# Patient Record
Sex: Female | Born: 1935
Health system: Southern US, Community
[De-identification: ages and names within clinical notes are randomized; demographics above are authoritative.]

## PROBLEM LIST (undated history)

## (undated) DIAGNOSIS — R0602 Shortness of breath: Secondary | ICD-10-CM

## (undated) DIAGNOSIS — J189 Pneumonia, unspecified organism: Secondary | ICD-10-CM

## (undated) DIAGNOSIS — Z9289 Personal history of other medical treatment: Secondary | ICD-10-CM

## (undated) DIAGNOSIS — K8020 Calculus of gallbladder without cholecystitis without obstruction: Secondary | ICD-10-CM

## (undated) DIAGNOSIS — G629 Polyneuropathy, unspecified: Secondary | ICD-10-CM

## (undated) DIAGNOSIS — K219 Gastro-esophageal reflux disease without esophagitis: Secondary | ICD-10-CM

## (undated) DIAGNOSIS — J449 Chronic obstructive pulmonary disease, unspecified: Secondary | ICD-10-CM

## (undated) DIAGNOSIS — I351 Nonrheumatic aortic (valve) insufficiency: Secondary | ICD-10-CM

## (undated) DIAGNOSIS — R112 Nausea with vomiting, unspecified: Secondary | ICD-10-CM

## (undated) DIAGNOSIS — Z9889 Other specified postprocedural states: Secondary | ICD-10-CM

## (undated) DIAGNOSIS — I499 Cardiac arrhythmia, unspecified: Secondary | ICD-10-CM

## (undated) DIAGNOSIS — H6120 Impacted cerumen, unspecified ear: Secondary | ICD-10-CM

## (undated) DIAGNOSIS — I4891 Unspecified atrial fibrillation: Secondary | ICD-10-CM

## (undated) DIAGNOSIS — I10 Essential (primary) hypertension: Secondary | ICD-10-CM

## (undated) DIAGNOSIS — M1990 Unspecified osteoarthritis, unspecified site: Secondary | ICD-10-CM

## (undated) DIAGNOSIS — E739 Lactose intolerance, unspecified: Secondary | ICD-10-CM

## (undated) DIAGNOSIS — I872 Venous insufficiency (chronic) (peripheral): Secondary | ICD-10-CM

## (undated) DIAGNOSIS — F419 Anxiety disorder, unspecified: Secondary | ICD-10-CM

## (undated) DIAGNOSIS — E039 Hypothyroidism, unspecified: Secondary | ICD-10-CM

## (undated) DIAGNOSIS — Z8719 Personal history of other diseases of the digestive system: Secondary | ICD-10-CM

## (undated) HISTORY — PX: CATARACT EXTRACTION W/ INTRAOCULAR LENS  IMPLANT, BILATERAL: SHX1307

## (undated) HISTORY — DX: Lactose intolerance, unspecified: E73.9

## (undated) HISTORY — DX: Unspecified atrial fibrillation: I48.91

## (undated) HISTORY — DX: Personal history of other medical treatment: Z92.89

## (undated) HISTORY — PX: JOINT REPLACEMENT: SHX530

## (undated) HISTORY — DX: Polyneuropathy, unspecified: G62.9

## (undated) HISTORY — DX: Chronic obstructive pulmonary disease, unspecified: J44.9

## (undated) HISTORY — DX: Essential (primary) hypertension: I10

## (undated) HISTORY — DX: Impacted cerumen, unspecified ear: H61.20

## (undated) HISTORY — PX: HERNIA REPAIR: SHX51

## (undated) HISTORY — PX: BACK SURGERY: SHX140

## (undated) HISTORY — DX: Calculus of gallbladder without cholecystitis without obstruction: K80.20

## (undated) HISTORY — DX: Venous insufficiency (chronic) (peripheral): I87.2

## (undated) HISTORY — DX: Hypothyroidism, unspecified: E03.9

## (undated) HISTORY — DX: Nonrheumatic aortic (valve) insufficiency: I35.1

## (undated) HISTORY — PX: CARPAL TUNNEL RELEASE: SHX101

## (undated) HISTORY — DX: Anxiety disorder, unspecified: F41.9

---
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
## 1968-03-31 HISTORY — PX: TOTAL ABDOMINAL HYSTERECTOMY: SHX209

## 1997-07-21 ENCOUNTER — Ambulatory Visit (HOSPITAL_COMMUNITY): Admission: RE | Admit: 1997-07-21 | Discharge: 1997-07-21 | Payer: Self-pay | Admitting: *Deleted

## 1997-12-21 ENCOUNTER — Encounter: Payer: Self-pay | Admitting: Gastroenterology

## 1997-12-21 ENCOUNTER — Ambulatory Visit (HOSPITAL_COMMUNITY): Admission: RE | Admit: 1997-12-21 | Discharge: 1997-12-21 | Payer: Self-pay | Admitting: Gastroenterology

## 1998-02-05 ENCOUNTER — Encounter: Payer: Self-pay | Admitting: Specialist

## 1998-02-05 ENCOUNTER — Ambulatory Visit (HOSPITAL_COMMUNITY): Admission: RE | Admit: 1998-02-05 | Discharge: 1998-02-05 | Payer: Self-pay | Admitting: Specialist

## 1998-03-31 HISTORY — PX: OTHER SURGICAL HISTORY: SHX169

## 1998-05-15 ENCOUNTER — Other Ambulatory Visit: Admission: RE | Admit: 1998-05-15 | Discharge: 1998-05-15 | Payer: Self-pay | Admitting: Internal Medicine

## 1998-06-18 ENCOUNTER — Ambulatory Visit (HOSPITAL_COMMUNITY): Admission: RE | Admit: 1998-06-18 | Discharge: 1998-06-18 | Payer: Self-pay | Admitting: *Deleted

## 1998-10-17 ENCOUNTER — Inpatient Hospital Stay (HOSPITAL_COMMUNITY): Admission: RE | Admit: 1998-10-17 | Discharge: 1998-10-23 | Payer: Self-pay | Admitting: Orthopedic Surgery

## 1998-10-17 ENCOUNTER — Encounter: Payer: Self-pay | Admitting: Orthopedic Surgery

## 1998-10-20 ENCOUNTER — Encounter: Payer: Self-pay | Admitting: Orthopedic Surgery

## 1998-10-23 ENCOUNTER — Inpatient Hospital Stay (HOSPITAL_COMMUNITY)
Admission: RE | Admit: 1998-10-23 | Discharge: 1998-10-27 | Payer: Self-pay | Admitting: Physical Medicine & Rehabilitation

## 1998-10-31 ENCOUNTER — Encounter: Admission: RE | Admit: 1998-10-31 | Discharge: 1998-11-18 | Payer: Self-pay

## 2000-04-04 ENCOUNTER — Emergency Department (HOSPITAL_COMMUNITY): Admission: EM | Admit: 2000-04-04 | Discharge: 2000-04-04 | Payer: Self-pay | Admitting: Emergency Medicine

## 2000-04-04 ENCOUNTER — Encounter: Payer: Self-pay | Admitting: Emergency Medicine

## 2000-04-09 ENCOUNTER — Emergency Department (HOSPITAL_COMMUNITY): Admission: EM | Admit: 2000-04-09 | Discharge: 2000-04-09 | Payer: Self-pay | Admitting: Emergency Medicine

## 2000-07-29 ENCOUNTER — Encounter: Payer: Self-pay | Admitting: Neurological Surgery

## 2000-07-29 ENCOUNTER — Encounter: Admission: RE | Admit: 2000-07-29 | Discharge: 2000-07-29 | Payer: Self-pay | Admitting: Neurological Surgery

## 2000-08-11 ENCOUNTER — Encounter: Admission: RE | Admit: 2000-08-11 | Discharge: 2000-08-11 | Payer: Self-pay | Admitting: Gastroenterology

## 2000-08-11 ENCOUNTER — Encounter: Payer: Self-pay | Admitting: Gastroenterology

## 2000-08-25 ENCOUNTER — Encounter: Admission: RE | Admit: 2000-08-25 | Discharge: 2000-08-25 | Payer: Self-pay | Admitting: Neurological Surgery

## 2000-08-25 ENCOUNTER — Encounter: Payer: Self-pay | Admitting: Neurological Surgery

## 2000-09-16 ENCOUNTER — Encounter: Admission: RE | Admit: 2000-09-16 | Discharge: 2000-09-16 | Payer: Self-pay | Admitting: Gastroenterology

## 2000-09-16 ENCOUNTER — Encounter: Payer: Self-pay | Admitting: Gastroenterology

## 2000-11-02 ENCOUNTER — Ambulatory Visit (HOSPITAL_COMMUNITY): Admission: RE | Admit: 2000-11-02 | Discharge: 2000-11-02 | Payer: Self-pay | Admitting: Gastroenterology

## 2000-11-02 ENCOUNTER — Encounter (INDEPENDENT_AMBULATORY_CARE_PROVIDER_SITE_OTHER): Payer: Self-pay | Admitting: *Deleted

## 2000-12-16 ENCOUNTER — Ambulatory Visit (HOSPITAL_COMMUNITY): Admission: RE | Admit: 2000-12-16 | Discharge: 2000-12-16 | Payer: Self-pay | Admitting: Gastroenterology

## 2000-12-16 ENCOUNTER — Encounter: Payer: Self-pay | Admitting: Gastroenterology

## 2002-07-21 ENCOUNTER — Emergency Department (HOSPITAL_COMMUNITY): Admission: EM | Admit: 2002-07-21 | Discharge: 2002-07-21 | Payer: Self-pay | Admitting: *Deleted

## 2003-05-05 ENCOUNTER — Encounter: Admission: RE | Admit: 2003-05-05 | Discharge: 2003-05-05 | Payer: Self-pay | Admitting: Internal Medicine

## 2003-07-24 ENCOUNTER — Encounter: Admission: RE | Admit: 2003-07-24 | Discharge: 2003-07-24 | Payer: Self-pay | Admitting: Neurological Surgery

## 2004-04-08 ENCOUNTER — Encounter: Admission: RE | Admit: 2004-04-08 | Discharge: 2004-04-08 | Payer: Self-pay | Admitting: Gastroenterology

## 2005-03-13 ENCOUNTER — Inpatient Hospital Stay (HOSPITAL_COMMUNITY): Admission: EM | Admit: 2005-03-13 | Discharge: 2005-03-16 | Payer: Self-pay | Admitting: Emergency Medicine

## 2005-04-24 ENCOUNTER — Observation Stay (HOSPITAL_COMMUNITY): Admission: AD | Admit: 2005-04-24 | Discharge: 2005-04-28 | Payer: Self-pay | Admitting: Internal Medicine

## 2005-11-10 ENCOUNTER — Encounter: Admission: RE | Admit: 2005-11-10 | Discharge: 2005-11-10 | Payer: Self-pay | Admitting: Gastroenterology

## 2006-03-31 HISTORY — PX: COLON SURGERY: SHX602

## 2006-06-19 ENCOUNTER — Encounter (INDEPENDENT_AMBULATORY_CARE_PROVIDER_SITE_OTHER): Payer: Self-pay | Admitting: *Deleted

## 2006-06-19 ENCOUNTER — Inpatient Hospital Stay (HOSPITAL_COMMUNITY): Admission: EM | Admit: 2006-06-19 | Discharge: 2006-06-30 | Payer: Self-pay | Admitting: Emergency Medicine

## 2006-11-02 ENCOUNTER — Encounter: Admission: RE | Admit: 2006-11-02 | Discharge: 2006-11-02 | Payer: Self-pay | Admitting: General Surgery

## 2006-11-10 ENCOUNTER — Encounter (INDEPENDENT_AMBULATORY_CARE_PROVIDER_SITE_OTHER): Payer: Self-pay | Admitting: General Surgery

## 2006-11-10 ENCOUNTER — Inpatient Hospital Stay (HOSPITAL_COMMUNITY): Admission: RE | Admit: 2006-11-10 | Discharge: 2006-11-17 | Payer: Self-pay | Admitting: General Surgery

## 2006-12-29 ENCOUNTER — Emergency Department (HOSPITAL_COMMUNITY): Admission: EM | Admit: 2006-12-29 | Discharge: 2006-12-29 | Payer: Self-pay | Admitting: Emergency Medicine

## 2007-06-30 ENCOUNTER — Encounter: Admission: RE | Admit: 2007-06-30 | Discharge: 2007-06-30 | Payer: Self-pay | Admitting: Internal Medicine

## 2007-07-02 ENCOUNTER — Ambulatory Visit (HOSPITAL_COMMUNITY): Admission: RE | Admit: 2007-07-02 | Discharge: 2007-07-02 | Payer: Self-pay | Admitting: Internal Medicine

## 2007-08-06 ENCOUNTER — Emergency Department (HOSPITAL_COMMUNITY): Admission: EM | Admit: 2007-08-06 | Discharge: 2007-08-06 | Payer: Self-pay | Admitting: Emergency Medicine

## 2008-06-21 ENCOUNTER — Encounter: Admission: RE | Admit: 2008-06-21 | Discharge: 2008-06-21 | Payer: Self-pay | Admitting: Gastroenterology

## 2008-07-12 ENCOUNTER — Ambulatory Visit (HOSPITAL_COMMUNITY): Admission: RE | Admit: 2008-07-12 | Discharge: 2008-07-12 | Payer: Self-pay | Admitting: Internal Medicine

## 2008-10-02 ENCOUNTER — Emergency Department (HOSPITAL_COMMUNITY): Admission: EM | Admit: 2008-10-02 | Discharge: 2008-10-03 | Payer: Self-pay | Admitting: Emergency Medicine

## 2009-04-25 DIAGNOSIS — M810 Age-related osteoporosis without current pathological fracture: Secondary | ICD-10-CM | POA: Insufficient documentation

## 2009-05-16 DIAGNOSIS — E7439 Other disorders of intestinal carbohydrate absorption: Secondary | ICD-10-CM | POA: Insufficient documentation

## 2009-05-16 DIAGNOSIS — G629 Polyneuropathy, unspecified: Secondary | ICD-10-CM | POA: Insufficient documentation

## 2009-05-16 DIAGNOSIS — R7301 Impaired fasting glucose: Secondary | ICD-10-CM | POA: Insufficient documentation

## 2009-05-23 ENCOUNTER — Encounter: Admission: RE | Admit: 2009-05-23 | Discharge: 2009-05-23 | Payer: Self-pay | Admitting: Internal Medicine

## 2009-06-11 ENCOUNTER — Encounter: Admission: RE | Admit: 2009-06-11 | Discharge: 2009-06-11 | Payer: Self-pay | Admitting: Gastroenterology

## 2010-06-04 ENCOUNTER — Other Ambulatory Visit: Payer: Self-pay | Admitting: Internal Medicine

## 2010-06-04 DIAGNOSIS — Z1231 Encounter for screening mammogram for malignant neoplasm of breast: Secondary | ICD-10-CM

## 2010-06-12 ENCOUNTER — Ambulatory Visit: Payer: Self-pay

## 2010-06-26 ENCOUNTER — Ambulatory Visit
Admission: RE | Admit: 2010-06-26 | Discharge: 2010-06-26 | Disposition: A | Discharge status: Home / Self Care | Payer: MEDICARE | Source: Ambulatory Visit | Attending: Internal Medicine | Admitting: Internal Medicine

## 2010-06-26 DIAGNOSIS — Z1231 Encounter for screening mammogram for malignant neoplasm of breast: Secondary | ICD-10-CM

## 2010-08-13 NOTE — Op Note (Signed)
[name], [name]               ACCOUNT NO.:  [id number]   MEDICAL RECORD NO.:  [id number]          PATIENT TYPE:  INP   LOCATION:  1535                         FACILITY:  [facility]   PHYSICIAN:  Anselm Pancoast. Weatherly, M.D.DATE OF BIRTH:  12-15-1926   DATE OF PROCEDURE:  11/10/2006  DATE OF DISCHARGE:                               OPERATIVE REPORT   PREOPERATIVE DIAGNOSIS:  Status post perforated sigmoid colon from  diverticulitis now for [facility] and end colostomy takedown.   ANESTHESIA:  General anesthesia.   OPERATIONS:  Takedown of end colostomy and resection of segment  diverticulosis, distal sigmoid and end-to-end anastomosis, general  anesthesia.   SURGEON:  Anselm Pancoast. Zachery Dakins, M.D.   ASSISTANT:  Ollen Gross. Vernell Morgans, M.D.   HISTORY:  Heather [name] is a 77 year old female who presented to the  hospital with severe abdominal pain back in March of this year with a  perforated colon.  She had been getting over bunch of viral and  bacterial pneumonia, sinusitis, had become extremely constipated and had  significant diverticulosis.  We removed the perforation, did an end  colostomy and Hartmann and she is here today for takedown of the  colostomy.  A barium enema showed that there was still a segment  probably about 4 or 5 inches of distal sigmoid that had diverticulosis  and we are planning to resect that area.  She has had a GoLYTELY,  erythromycin, neomycin and saline enemas preps and said she tolerated  the preps well.  The patient lab studies are unremarkable and she was  given 3 grams of Unasyn and has PAS stockings.   She was taken back to the operative suite, Foley catheter inserted.  I  then put a pursestring in the colostomy to prevent any mucus drainage  and her abdomen was prepped Betadine surgical solution and draped in  sterile manner.  The old midline incision was opened up.  She has got a  very redundant low-lying sigmoid colon that was really right under the  incision and we carefully entered into the peritoneal cavity.  The  omentum and the mesentery to that was carefully dissected away from the  abdominal wall.  The transverse colon was then slipped to the upper  abdomen and then there was extensive pelvic adhesions that required  removal.  She is thin but sort of a deep pelvis and we had her in a  pretty steep Trendelenburg position.  I first elected to go ahead and  take down the end colostomy to help give [place] better exposure down in the  pelvis and we ellipsed out the skin around this, freed the colon from  the subcutaneous tissue and then working from the underside, took down  where the bowel had been sutured to the abdominal wall.  Several little  bleeders required suturing with 3-0 silk and then I closed the  peritoneum with running 2-0 Vicryl and 0-0 Vicryl.  Later after we had  done the anastomosis I also put 0-0 Prolene sutures, knots inverted in  the fascial layer.  As far as with the colostomy taken down,  it did give  [place] better exposure of the pelvis but she has had pretty dense adhesions  that had to be completely removed so that we could get the numerous  loops of small bowel out of the pelvis.  With these freed then we could  use wet laps held in place with the extension on the Killbuck and then we  could identify the sutures that had been placed in the distal sigmoid  for identification.  These using as a guide we started dissecting and  divided the mesentery from the colon staying very close to the colon and  did not actually go down and identify the left ureter.  I went down and  the areas of diverticulosis, you could certainly feel and evaluate this  area and we took it on down right to the I think it is actually the  rectal sigmoid junction and then divided the area at this level over a  vascular clamp for hemostasis.  Traction sutures were placed in the  medial and lateral side and then we took the proximal sigmoid where  the  colostomy had been, picked a good area dividing the little mesentery to  that so that we probably removed about an inch and half of the proximal  colon.  There was plenty of length and then after the pedicles to this  had been tied with 3-0 silk did a 3-0 silk hand-sewn anastomosis, knots  inverted with the exception of the little Lembert and Gambert sutures  anteriorly.  The line was without excessive tension.  You can feel one  to two finger opening and nothing that we could tell looked like was any  separation of serosal anastomosis.  The little mesenteric defect was  closed with a couple sutures of 3-0 silk.  The small bowel was run  retrograde to make sure it is lying anatomically correct.  There was  little serosal tear on a segment of the ileum that was closed with four  sutures of 3-0 silk.  There was no mucosal injury of this.  Next the  omentum was brought down over the small bowel, was lying comfortable and  it is at this point that I actually put the 0-0 Prolene sutures to close  fascia around colostomy.  The midline was closed with a running #1 PDS  with a few interrupted 0-0 Prolenes knots inverted since she is so thin.  The subcutaneous tissue was irrigated and then we closed the skin with  staples at both midline and the end colostomy.  She tolerated procedure  nicely and will get an NG tube in place and I plan to give her about 24  hours of the Unasyn postoperatively.  Appeared to have a good prepped  and hopefully she will start having bowel function in four to five days.           ______________________________  Anselm Pancoast. Zachery Dakins, M.D.     WJW/MEDQ  D:  11/10/2006  T:  11/11/2006  Job:  [id number]   cc:   Geoffry Paradise, M.D.  Fax: [phone]

## 2010-08-13 NOTE — H&P (Signed)
NAMEMARLISS, [name]               ACCOUNT NO.:  [id number]   MEDICAL RECORD NO.:  [id number]          PATIENT TYPE:  EMS   LOCATION:  ED                           FACILITY:  [facility]   PHYSICIAN:  Anselm Pancoast. Weatherly, M.D.DATE OF BIRTH:  Apr 16, 1926   DATE OF ADMISSION:  12/29/2006  DATE OF DISCHARGE:  12/29/2006                              HISTORY & PHYSICAL   CHIEF COMPLAINT:  Severe abdominal pain.   HISTORY:  Heather [name] is a 77 year old female who had a perforated  colon from diverticulitis back in January, early February, and presented  to the ER here with severe abdominal pain, and had a perforated colon  with free air.  She was taken to the operating suite, later that day,  and a sigmoid colectomy, end colostomy, and [name] was performed.  Four months later after studying her colon we take her back in, took  down the colostomy, removed the remaining segment of diverticulosis in  the more distal sigmoid, and she has done fine.  Today while at work she  said at lunch she had a [employer], and then about 2 o'clock started  having very severe abdominal pain.  She left work, came to our office,  and was seen by my nurse and Dr. Manus Rudd and they were impressed  that she was extremely distended, and was just acting as if she was  dying with left lower quadrant abdominal pain.   I was about 45 minutes later getting to the office, and when I arrived  she obviously was not having the severe pain that she had had initially.  She had fairly active bowel sounds, but was gaseous, and we were  uncomfortable letting her come to the emergency room, since she was  acting as if she was in severe distress when she originally presented.  She was afebrile in the office, and stated that she had had a bowel  movement earlier today,  and she has had no problems whatsoever with  bowel function since her surgery about 7 weeks earlier.  I thought it  would be best to let her come on over to the  emergency room for lab  studies and x-rays, and we sent her by EMS since her husband was not  with her.  Since she has arrived here she has basically had no further  episode of pain.  We did a CBC, CMET, and urinalysis, and an acute  abdominal plain x-rays.  The acute abdominal x-ray showed no free air  and no significant small bowel dilatation.  There is gas in the colon,  and the radiologist says that it is kind of a nonspecific abdominal gas  pattern.  She is a little more gaseous, than I would think usual, but on  exam now she certainly has gaseous bowel sounds, but she is not  distended.  She is has passed wind several times since she was seen in  the office.  Her blood pressure was significantly elevated in the  office.  She is not on blood pressure medication, but here she was  178/75, pulse 71, respirations  18, and she is afebrile.  Her white count  is 6900, and on first impression, I really think the pain sort of severe  and predominant in left lower quadrant is more like a kidney stone; but  with the pain subsided and a normal white count of 6900 and basically  pain free now, I think that it is unlikely that there is any perforation  or acute problem.  The urinalysis, however, does not show any blood in  her urine.  I think that it will be safe to release her, and let her  stay on liquids today, and we will chat with her in the morning.  If she  has the severe pain, again, she will need to return to the ER, and I  would get a CT, kind of a renal profile CT.  She does have some  phleboliths in the left lower quadrant and whether one of these is  actually a kidney stone, that she is passing, would still be my first  guess.  She is on Synthroid replacement, is not on hypertensive  medication.  She has been on that in the past, but she has never had a  kidney stone that she is aware of.  Her husband is here, now, to take  her home; and she will arrange for a follow up appointment  with me in my  office in about 3 weeks.   Another possibility, I reckon, that she could have had is kind of a  twist in the small bowel, but it certainly had resolved by the time she  arrived here, and the severeness and sudden onset of the pain, and  without actually vomiting, I think, it is unlikely that that would be  the etiology of the pain.           ______________________________  Anselm Pancoast. Zachery Dakins, M.D.     WJW/MEDQ  D:  12/29/2006  T:  12/30/2006  Job:  [id number]

## 2010-08-16 NOTE — H&P (Signed)
Heather [name], [name]               ACCOUNT NO.:  [id number]   MEDICAL RECORD NO.:  [id number]          PATIENT TYPE:  INP   LOCATION:  0098                         FACILITY:  [facility]   PHYSICIAN:  Anselm Pancoast. Weatherly, M.D.DATE OF BIRTH:  1926/10/04   DATE OF ADMISSION:  06/19/2006  DATE OF DISCHARGE:                              HISTORY & PHYSICAL   CHIEF COMPLAINT:  Severe abdominal pain, greater than 24 hours.   HISTORY:  Heather [name] is a 77 year old Caucasian female who came to  the emergency room approximately midnight with the following history:  She said the previous evening (that would have been Wednesday) around  suppertime she started having kind of cramping lower abdominal pain,  tried to have a bowel movement, had some supper, then had more intensive  pain.  She has had a history over the past several weeks to months of  problems with infection that she said originally kind of started out  with a pneumonia; she was treated as an outpatient by Dr. [name] and  then got problems with sinusitis and kind of a migratory type of  basically what she describes as an infection.  She said on Sunday or  Monday, Dr. [name] started her on steroids--she does not know the  dosage--and she is on Augmentin and then 2 days later, she started  having this severe lower abdominal pain.  She became distended, did not  do anything the following day, even though she said she had never had  pain as bad, and then when it became very, very intense, presented to  the emergency room.  She has a history of mild hypertension, I think.  Her chronic medications are Diovan, she is on Synthroid, she is on  Augmentin and she is on Prilosec and promethazine.  When she came to the  emergency room, she was seen by Freida Busman, was not febrile, did not have a  tachycardia, but was in significant pain and she was given intravenous  Dilaudid.  Her white count came back 23,000.  An acute abdominal series  was  performed which I think showed a little bit of questionable free  air, but did not show an active pneumonia, and then a CT was performed  and this showed free air.  There was fluid in the pelvis,she is  extremely constipated and there were not any loops of bowel that were  definitely thickened like the diverticulitis, but there was definitely  diverticulosis in the sigmoid colon.  I was called about 3 a.m., came in  shortly afterwards and on examination, she was definitely tender  diffusely in her abdomen, the lower probably more so than right, but  more so than the upper abdomen, but definitely peritoneal findings.  I  reviewed the CT and discussed the findings with the patient and her  husband, reviewing the x-rays with him, and recommended that we proceed  on with a laparotomy promptly.  When they called me, I had asked them to  go ahead and give her a dose of Zosyn, which was being administered when  I arrived.  We discussed that  most likely this was perforated colon and  it would require a colon resection and colostomy and then hopefully the  colostomy could be taken down in several months.  The patient had  already had the chest x-ray that really had not shown any significant  pneumonia and we obtained an EKG.  The patient will be going to the  operating room soon.   On reviewing on our old x-rays and all, she has had problems with  constipation previously and a virtual colonoscopy was attempted probably  in the last year or so, but it was not successful, since they could not  distend the colon and it was basically a CT, but it did not show any  acute findings.   PAST MEDICAL HISTORY:  1. She had an appendectomy as a youngster.  2. She has a hysterectomy through a lower midline incision.   FAMILY HISTORY:  I think is noncontributory.   PHYSICAL EXAMINATION:  GENERAL:  She is an elderly female, but quite  talkative and spry.  VITAL SIGNS:  Her temperature was 98.4, pulse was  84, respirations 20  and blood pressure after the Dilaudid was 112/47.  NECK:  Her veins were not distended.  There is no cervical or  supraclavicular lymphadenopathy and she does not appear warm to touch.  CHEST:  She has good breath sounds bilaterally.  CARDIAC:  Normal sinus rhythm.  ABDOMEN:  She is diffusely tender.  Her lower midline incision and an  old appendectomy are both well-healed and no hernias noted.  She is  definitely tender in the lower abdomen and mildly tender in the upper  abdomen.  EXTREMITIES:  I did not appreciate any pedal edema and she has adequate  peripheral pulses.  CNS:  Physiologic.   ADMISSION IMPRESSION:  1. Perforated viscus, probably colon.  Antibiotics are being      administered and we will plan on proceeding with an urgent      laparotomy.  2. She has got a history of hypertension, but she has not been      hypertensive during this admission.   LABORATORY DATA:  Her serum electrolytes were basically unremarkable  with BUN of 16 and a creatinine was 0.96.  White count was 22,300 with a  marked left shift, 92% neutrophils and hematocrit was 44.8.           ______________________________  Anselm Pancoast. Zachery Dakins, M.D.     WJW/MEDQ  D:  06/19/2006  T:  06/19/2006  Job:  [id number]

## 2010-08-16 NOTE — H&P (Signed)
Heather [name], Heather [name]               ACCOUNT NO.:  [id number]   MEDICAL RECORD NO.:  [id number]          PATIENT TYPE:  INP   LOCATION:  6737                         FACILITY:  [facility]   PHYSICIAN:  Gwen Pounds, MD       DATE OF BIRTH:  [birth year]   DATE OF ADMISSION:  04/24/2005  DATE OF DISCHARGE:                                HISTORY & PHYSICAL   PRIMARY CARE Docia Klar:  Dr.  [name].   SURGEON:  Dr. Ezzard Standing.   CHIEF COMPLAINT:  Severe abdominal pain.   HISTORY OF PRESENT ILLNESS:  This is a 77 year old female seen in the office  earlier today with four days of lower abdominal pain described as a diffuse  feeling like pressure and gas. She was uncomfortable. She had not had a  bowel movement in a couple of days, but was having liquid fecal  incontinence. She had diffuse abdominal pain on exam. It was worse in the  central area around her abdominal scar from the history of hysterectomy. She  had mild rebound and guarding. She had increased bowel sounds that were  noted. Rectal examination revealed liquid bowel movements. The rest of her  exam was fine. Her exam is kind of out of proportion to the overall way that  she looked. She has a history of mild diverticulosis. White count was  obtained and was 11,000. She was sent for abdominal and pelvic CT scan  without contrast. The scan revealed colitis, infectious versus ischemic,  with questionable pneumatosis and micro perforations.  Gallbladder was also  noted to be distended. I came over to [facility] and will send her to the  ED for evaluation and treatment. I had a long discussion with Dr. Ezzard Standing and  decided that we would directly admit her and evaluate her from the floor.  She may need a possible exploratory laparotomy and resection of the bowel.  No matter what Dr. Ezzard Standing decides at this point she will need admission IV  fluids, IV antibiotics, and bowel rest.   PAST MEDICAL HISTORY:  1.  Hypothyroidism.  2.  Irritable  bowel syndrome.  3.  Hysterectomy.  4.  Hypertension.  5.  Diverticulosis.  6.  Status post esophageal dilatation.  7.  History of hip prosthesis.  8.  Laminectomy and caging of her lumbar spine.   ALLERGIES:  SEPTRA.   MEDICATIONS:  Synthroid, Diovan/HCT, multivitamin, aspirin, calcium, vitamin  D, and over-the-counter tears.   SOCIAL HISTORY:  She is married. She is not a smoker.   FAMILY HISTORY:  Currently noncontributory due to her pain.   REVIEW OF SYSTEMS:  She denies any chest pain or shortness of breath. She  denies any ear, nose, or throat symptoms. Denies any pulmonary symptoms. She  denies any lower extremity symptoms, but is having severe abdominal pain as  described above.   PHYSICAL EXAMINATION:  VITAL SIGNS: Afebrile. All vital signs stable. Heart  rate is regular and normal. Blood pressure is appropriate.  GENERAL: Alert and oriented times three. Does not appear to be in any  distress.  HEENT: Oropharynx is  dry.  NECK: No JVD.  PULMONARY: Clear to auscultation bilaterally.  CARDIAC: Regular without murmurs.  ABDOMEN: Diffusely tender. There is mild to moderate guarding, mild rebound.  Bowel sounds are noted.  RECTAL EXAM: Compatible with brownish liquid with fecal incontinence with  diarrhea.  EXTREMITIES: No clubbing, cyanosis, or edema.   ANCILLARY DATA:  White count is 11,000.   ASSESSMENT:  This is a 77 year old female with severe abdominal pain and a  CT scan revealing severe colitis, questionable ischemia versus inflammatory  versus other. There is also some suggestion of pneumatosis and possible  micro perforations.   PLAN:  1.  Admit.  2.  General surgery to evaluate ASAP.  3.  Laboratory data to include CBC, blood cultures times two, urinalysis,      amylase, CMET, PT-INR, lactic acid level.  4.  I already spoke to Dr. Ezzard Standing who has evaluated the patient. He is going      to bring down the CT scan and review it with the radiologist. She  may      need exploratory laparotomy and resection of the bowel. At this point      the only true plan, besides what is stated above, is bowel rest, IV      antibiotics, fluids.  5.  All home medications are currently being held and will need to be      restarted as needed and as appropriate.  6.  Follow blood pressure accordingly.  7.  Pain control with morphine as ordered.      Gwen Pounds, MD  Electronically Signed     JMR/MEDQ  D:  04/24/2005  T:  04/24/2005  Job:  [id number]

## 2010-08-16 NOTE — Op Note (Signed)
Heather [name], Heather [name]               ACCOUNT NO.:  [id number]   MEDICAL RECORD NO.:  [id number]          PATIENT TYPE:  INP   LOCATION:  0098                         FACILITY:  [facility]   PHYSICIAN:  Anselm Pancoast. Weatherly, M.D.DATE OF BIRTH:  June 08, 1926   DATE OF PROCEDURE:  06/19/2006  DATE OF DISCHARGE:                               OPERATIVE REPORT   PREOPERATIVE DIAGNOSIS:  Perforated viscus, probably colon.   POSTOPERATIVE DIAGNOSIS:  Perforated distal sigmoid rectal junction  probably a stercoral ulcer and not diverticulitis with generalized  peritonitis.   OPERATION PERFORMED:  Exploratory laparotomy, sigmoid colectomy, end  colostomy and Hartmann.   SURGEON:  Anselm Pancoast. Zachery Dakins, M.D.   ANESTHESIA:  General.   ASSISTANT:  Currie Paris, M.D.   INDICATIONS FOR PROCEDURE:  Heather [name]. Barcelo is a 77 year old female who  was followed by Dr. [name] and states for the last several weeks, maybe  a month she has had kind of a cough, kind of a low grade pneumonia now  sinusitis and is on Augmentin and actually had been started on steroids  on Monday.  I am not sure exactly why since she is not a steroid chronic  dependent patient.  She said on Wednesday evening around supper time she  had a bowel movement, then tried to eat some supper and started having  abdominal pain.  The abdominal pain just got worse that evening  anastomosis she said that on Thursday she had the worst pain she has  every had, not sure whether she tried to contact her doctor or not, but  anyway she presented to the emergency room shortly before midnight.  She  was seen by Dr. Freida Busman in the emergency room.  White count was markedly  elevated at 22,300 with a marked left shift, acute abdominal series, I  think a question of a little free air and then a CT was obtained and the  CT showed definitely free air.  You see a lot of fluid in the pelvis and  the colon is extremely constipated but not that of an  obvious sigmoid  diverticulitis.  Clinically, she is certainly more tender in the lower  abdomen and most likely this is a perforated colon.  We thought  preoperatively it was probably diverticulitis.  The patient has had  problems with chronic constipation and does have diverticulosis.  She  had had attempted virtual colonoscopy about a year ago but it had been  basically unsuccessful since they could not distend the colon for the  exam.  She says that she had had some bowel movements during the day  when the abdominal pain actually started.  I started her on Zosyn and  permission obtained for exploratory laparotomy with repair of the  damage, probably colectomy, end colostomy and Hartmann.   Patient was taken over to the operative suite.  Induction of general  anesthesia.  She was given 500 mg of Flagyl shortly after going to  sleep.  NG tube was placed into the stomach and there was a large amount  of contrast that was sucked out.  The  abdomen after Foley catheter was  inserted sterilely was prepped in sterile manner with Betadine and then  draped in a sterile manner.  A lower midline incision where she had had  a previous hysterectomy was carefully opened and upon opening to the  peritoneal cavity there was a moderate amount of air but fortunately  there was not a lot a lot of stool contamination.  There was a moderate  amount of foul smelling fluid in the pelvis and this was aspirated.  The  omentum was adherent to the undersurface of the midline and there was a  lot of adhesions and all around where she had had the previous  appendectomy. These were carefully taken down and I switched to the left  side in doing this and then when the omentum out of the colon out of the  pelvis, we could see and could identify an area where the colon had  perforated very low sort of at the sigmoid rectal junction.  She had  some adhesions in the pelvis, not extensive and we could bring the   transverse colon that was looped down into the pelvis up out of the  pelvis and then packed out the small bowel and we picked the area where  we were going to transect the sigmoid colon and did this with a GIA and  then the mesentery was taken close to the distal area colon to be  resected and the vessels were tied with either 2-0 silk or 2-0 Vicryl.  We worked very close to the colon, did not go wide go identify the  ureters but I am sure we were not in close continuity to the ureters.  Her uterus was absent but the tubes and ovaries were present bilaterally  and they were kind of stuck down on this segment and these were kind of  carefully freed up since the perforation was really right at the level  where the old tubes and ovaries kind of adherent to the distal sigmoid  rectal junction.  We continued freeing just a little lower so I could  get an Allen clamp distal to the perforation which you could see and it  looked like it was probably a stercoral ulcer on the anterior  antimesenteric surface of the distal sigmoid rectal junction and not  that of diverticulitis.  We then used a TA-60, placed below the Satinsky  clamp and fired it and resected the involved segment of sigmoid colon.  The colon was just filled with hard stool.  Next, the two little edges  ends of the rectum where it was closed with staples were tagged with 2-0  Prolene sutures.  We thoroughly irrigated and aspirated and then removed  the Balfour that we were using and picked the area for the end colostomy  which was half way between the umbilicus and the anterior iliac crest  and made a little skin defect, dissected through the anterior rectus  fascia, split the rectus muscles.  She has got a thin layer and did not  actually divide any muscles and then opened into the peritoneal cavity.  I stretched the opening to a good finger length and we could bring up  the distal signal sigmoid colon. We freed up just a little bit  of the blood supply to the most distal end to come up above skin level nicely.  Next, we brought the colon out, left kind of a Babcock on the edge to  keep it from twisting  and then thoroughly irrigated with probably 5 of 6  L of saline and aspirated.  We had already cultured the fluid in the  pelvis aerobic and anaerobically.  Fortunately, there does not appear to  be any intra-abdominal loop abscesses, etc. and I think that really she  is so constipated that that is why there was no more stool in the  peritoneal cavity than there was since her period of perforation had  been definitely 24 hours plus prior to presenting to the emergency room.  Small bowel was place kind of back in anatomical position.  We did place  a little piece of Seprafilm over the distal rectal stump to hopefully  prevent adhesions so when we take the colostomy down probably about four  to six months, hopefully it will be easy to identify and free up nicely.  Next, the transverse colon was placed back without it being twisted.  I  think it was a little bit of a kink of the transverse colon that was  chronic possibly contributing to her constipation.  The omentum that we  did not remove was freed up.  A lot of little areas required ties with 2-  0 Vicryl and the omentum was placed over the small bowel.  I then closed  the peritoneum and posterior fascia with running 2-0 Vicryl.  The  anterior fascia was closed with interrupted figure-of-eight sutures of 0  Prolene and then the skin loosely closed with staples.  Next, I switched  to the left side and matured the colostomy, but a couple of little  sutures from the colon to anterior fascia, not through the bowel wall  but kind of through the little fatty tissue around the colon and then  removed the staple line under an Graham and then matured the colostomy  with interrupted sutures of 3-0 Vicryl.  She is having stooling as she  is waking up in a 1- 3/4 inch colostomy bag  into place over the stoma.  The patient's estimated blood loss was probably 150 mL or so.  No drains  were placed.  Of course she has a Foley catheter and an NG tube.  The NG  tube was in good position, had been checked prior to closing the  abdomen.           ______________________________  Anselm Pancoast. Zachery Dakins, M.D.     WJW/MEDQ  D:  06/19/2006  T:  06/19/2006  Job:  [id number]   cc:   Geoffry Paradise, M.D.  Fax: [phone]

## 2010-08-16 NOTE — Procedures (Signed)
[facility]  Patient:    Heather [name], Heather [name]                      MRN: [id number] Adm. Date:  [id number] Attending:  Nelda Marseille CC:         Richard A. [name], M.D.   Procedure Report  PROCEDURE:  Esophagogastroduodenoscopy.  INDICATION:  Upper tract symptoms, dysphagia, not responding to the usual medicines, nondiagnostic barium swallow.  Consent was signed prior to any premedications given after risks, benefits, methods, and options thoroughly discussed in the office.  No additional medicines were given for this procedure.  DESCRIPTION OF PROCEDURE:  The video endoscope was inserted by direct vision. A quick look at the posterior pharynx was normal.  The esophagus was normal. No signs of strictures, rings, or abnormalities were seen.  In the distal esophagus was a small to medium hiatal hernia, widely patent.  Scope passed into the stomach and then to the antrum, pertinent for some mild to moderate antritis, which was cold biopsied at the end of the procedure.  The scope was advanced through a normal pylorus into a normal duodenal bulb and around the C-loop to a normal second portion of the duodenum.  The scope was withdrawn back to the bulb, and a good look there ruled out ulcers in that location. The scope was withdrawn back to the stomach and retroflexed.  High in the cardia the hiatal hernia was confirmed.  The angularis, lesser and greater curve were all normal, as was the fundus on retroflexion and straight visualization, and the stomach was normal except for the antritis.  The scope was then slowly withdrawn back to the upper esophageal sphincter.  No additional findings were seen.  The scope was then advanced in the antrum, and biopsies were obtained.  Air was suctioned and the scope slowly withdrawn. Again a good look at the esophagus on slow withdrawal was normal without abnormality.  The scope was removed.  The patient tolerated  the procedure well.  There was no obvious immediate complication.  ENDOSCOPIC DIAGNOSES: 1. Small to medium hiatal hernia. 2. Antritis, status post biopsy. 3. Otherwise normal EGD.  PLAN:  Consider Reglan or an antispasmodic.  Await pathology.  Possibly proceed with an empiric dilatation.  Follow p.r.n. or in three months to recheck symptoms and decide on any other workup and plans, medicine trials.DD: 11/02/00 TD:  11/03/00 Job: [id number]

## 2010-08-16 NOTE — Discharge Summary (Signed)
NAMECHASTA, DESHPANDE               ACCOUNT NO.:  [id number]   MEDICAL RECORD NO.:  [id number]          PATIENT TYPE:  INP   LOCATION:  1604                         FACILITY:  [facility]   PHYSICIAN:  Kerrin Champagne, M.D.   DATE OF BIRTH:  08-18-1926   DATE OF ADMISSION:  03/13/2005  DATE OF DISCHARGE:  03/16/2005                                 DISCHARGE SUMMARY   ADMISSION DIAGNOSES:  1.  Right hip greater trochanter fracture.  2.  Hypothyroidism.  3.  Hypertension.   DISCHARGE DIAGNOSES:  1.  Right hip greater trochanter fracture.  2.  Hypothyroidism.  3.  Hypertension.  4.  Postoperative nausea resolved at discharge.   PROCEDURES:  None.   CONSULTATIONS:  None.   BRIEF HISTORY:  The patient is a 77 year old female who fell earlier on the  day of admission landing on the right hip. She was unable to bear weight on  the right hip secondary to discomfort and was brought to the emergency room  for evaluation. Radiographs in the emergency room revealed a fracture of the  right greater trochanter with possible extension into the femoral neck which  was nondisplaced. She was sent for a CT scan to rule out extension of the  fracture into the intertrochanteric area versus the femoral neck area. The  CT scan confirmed a comminuted greater trochanteric fracture of the right  hip only.   BRIEF HOSPITAL COURSE:  The patient was started on physical therapy for  progressive ambulation being 50% partial weightbearing on the operative  extremity. She did complain of numbness of the lower extremities which was  noted to be chronic in nature. She was started on Lyrica 75 mg p.o. b.i.d.  for the numbness in her lower extremities. She made slow progress with  physical therapy but was able to be discharged to her home in stable  condition on March 16, 2005 after she was ambulatory utilizing a walker  and maintaining her 50% partial weightbearing. She was able to ambulate 200  feet with  physical therapy and utilized oral analgesics without difficulty.  Her nausea resolved and she was taking a regular diet. Prior to discharge,  the patient was eating and voiding without difficulty.   LABORATORY VALUES ON ADMISSION:  CBC within normal limits. Chemistry studies  on admission normal with the exception of potassium 3.2. EKG on admission  showed normal sinus rhythm, normal EKG confirmed by Dione Booze, MD. No  significant change since last tracing on October 16, 1998. X-rays of the ankle  on admission showed osteoporosis of the bones with no fracture.   PLAN:  The patient was discharged to her home. She was given prescriptions  for Percocet for discomfort, Robaxin for muscle relaxation. She will follow  up with Dr. Otelia Sergeant two weeks from the date of her discharge. She was advised  to continue ambulating 50% partial weightbearing utilizing a walker for  assistance. She did not require home health services at discharge.   CONDITION ON DISCHARGE:  Stable.      Wende Neighbors, P.A.      Fayrene Fearing  Lynne Logan, M.D.  Electronically Signed    SMV/MEDQ  D:  05/05/2005  T:  05/05/2005  Job:  [id number]

## 2010-08-16 NOTE — Discharge Summary (Signed)
NAMEITATI, BROCKSMITH               ACCOUNT NO.:  [id number]   MEDICAL RECORD NO.:  [id number]          PATIENT TYPE:  INP   LOCATION:  1329                         FACILITY:  [facility]   PHYSICIAN:  Anselm Pancoast. Weatherly, M.D.DATE OF BIRTH:  1926/09/22   DATE OF ADMISSION:  06/18/2006  DATE OF DISCHARGE:  06/30/2006                               DISCHARGE SUMMARY   DISCHARGE DIAGNOSES:  1. Perforated colon secondary to chronic sigmoid diverticulitis and      constipation.  2. History of hypertension.   OPERATION:  Exploratory laparotomy, sigmoid colectomy and colostomy and  Hartmann.   HISTORY:  Heather [name] is a 77 year old female, came to the emergency  room approximately midnight with following history.  She stated that the  previous __________  had been Wednesday.  At suppertime, she started  having cramping, lower abdominal pain, tried to have a bowel movement,  but then had more intense pain.  She has a history over the past several  months with kind of some type of infection, and she has been treated by  Dr. [name].  She first said it was kind of a flu, then a pneumonia, and  then sinusitis and has been on multiple antibiotics.  He had started her  on steroids approximately Monday, when she started having the pain on  Wednesday, and she was seen in the emergency room by Dr. Freida Busman, was not  febrile, and did not have a __________  cardio.  She was given  intravenous pain medication.  Her white count came back 23,000.  Acute  abdominal series showed a question of a little bit of free air.  CT was  performed, and this showed numerous areas, small quantities of free air.  I was asked to see her, and on physical exam she was definitely acutely  tender, consistent with perforated viscus, probably colon.  She agreed  to exploratory laparotomy and was taken to surgery fairly promptly.  Dr.  Jamey Ripa assisted, and we found a perforation of the very distal sigmoid  that looked likely on  the antimesenteric surface, like possibly a ulcer  or ischemic caused by constipation, and she was extremely  constipated  __________  colon.  The involved area was excised, and a Hartmann  procedure and end-colostomy was performed, then she was sent to the ICU  postoperatively.  It was approximately 2-3 days before she was started  having good colostomy function with a degree of constipation.  She was  followed by Dr. [name].  He had her on broad antibiotic coverage.  Her  wounds appeared to be healing satisfactory.  There was a lot of cramps  and abdominal pain before her colon really started working nicely, and  she was on Flagyl and Zosyn.  With the colostomy finally releasing all  of this chronic constipation, she felt much better and then was started  on liquid diet.  This was about the third or fourth day after surgery,  and her diet was advanced.  Her incision appears to be healing  satisfactory.  After completion of the antibiotics here in the hospital,  Dr. [name] said that the sinus infection, etc., had been adequately  treated.  She was only on the steroids for 2 days.  We did not give her  steroids during this hospitalization.  She is managing her colostomy  satisfactory, and it will be approximately 4 months before she has her  colostomy take down.  Her hematocrit was about 39 with normal white  count, the day prior to her surgery.  She is on chronic Synthroid, which  she will resume and will follow with me in the office in approximately 1  week.  She was also on Darvocet and Diovan, if her blood pressure would  become elevated, which she will begin to take blood pressure daily.  She  was not having an elevated blood pressure at the time of her discharge.           ______________________________  Anselm Pancoast. Zachery Dakins, M.D.     WJW/MEDQ  D:  07/26/2006  T:  07/26/2006  Job:  [id number]   cc:   Anselm Pancoast. Zachery Dakins, M.D.  [address]  [place]  [place]  [address]   Geoffry Paradise, M.D.  Fax: [phone]   Patient Chart

## 2010-08-16 NOTE — Op Note (Signed)
Heather [name], Heather [name]               ACCOUNT NO.:  [id number]   MEDICAL RECORD NO.:  [id number]          PATIENT TYPE:  INP   LOCATION:  0098                         FACILITY:  [facility]   PHYSICIAN:  Anselm Pancoast. Weatherly, M.D.DATE OF BIRTH:  1927-01-22   DATE OF PROCEDURE:  DATE OF DISCHARGE:                               OPERATIVE REPORT   NO DICTATION           ______________________________  Anselm Pancoast. Heather [name], M.D.     WJW/MEDQ  D:  06/19/2006  T:  06/19/2006  Job:  [id number]

## 2010-08-16 NOTE — Procedures (Signed)
[facility]  Patient:    Heather [name], Heather [name]                      MRN: [id number] Proc. Date: 11/02/00 Adm. Date:  [id number] Attending:  Nelda Marseille CC:         Petra Kuba, M.D.  Richard A. [name], M.D.   Procedure Report  PROCEDURE PERFORMED:  Colonoscopy.  ENDOSCOPIST:  Petra Kuba, M.D.  INDICATIONS FOR PROCEDURE:  Patient with a history of colon polyps, persistent lower tract symptoms.  Consent was signed after risks, benefits, methods, and options were thoroughly discussed multiple times in the past.  MEDICATIONS USED:  Demerol 80 mg, Versed 7 mg.  DESCRIPTION OF PROCEDURE:  Rectal inspection was pertinent for external hemorrhoids.  Digital exam was pertinent for decreased sphincter tone. Pediatric video colonoscope was inserted and again with lots of difficulty because of significantly tortuous sigmoid.  Once able to be advanced around the sigmoid which did require rolling her on her back with some abdominal pressure, we were able to readvance to the cecum.  Other than occasional left-sided diverticula, no other abnormality was seen.  The scope was inserted a short ways into the terminal ileum which was normal.  Photodocumentation was obtained.  The scope was slowly withdrawn.  Prep was adequate.  There was minimal liquid stool that required washing on slow withdrawal through the colon, other than an occasional left-sided diverticulum, no abnormalities were seen.  The sigmoid was tortuous and there were some tortuosities elsewhere and so when we felt back around a loop, we did try to readvance around the curves to decrease the chance of missing things.  Once back in the rectum, the scope was retroflexed, pertinent for some internal hemorrhoids.  Scope was straightened, air was withdrawn.  Scope removed.  The patient tolerated the procedure adequately.  There was no obvious immediate complication.  ENDOSCOPIC DIAGNOSIS: 1.  Internal and external hemorrhoids with decreased sphincter tone. 2. Occasional left-sided diverticula. 3. Significant sigmoid tortuosity. 4. Otherwise within normal limits to the terminal ileum.  PLAN:  Rechecked colon possibly in five years.  Consider virtual colonoscopy or an air contrast barium enema at that juncture based on tortuosity. Continue work-up with an EGD. DD:  11/02/00 TD:  11/02/00 Job: [id number]

## 2010-08-16 NOTE — Discharge Summary (Signed)
NAMESHAWNITA, KRIZEK               ACCOUNT NO.:  [id number]   MEDICAL RECORD NO.:  [id number]          PATIENT TYPE:  INP   LOCATION:  6737                         FACILITY:  [facility]   PHYSICIAN:  Geoffry Paradise, M.D.  DATE OF BIRTH:  1926/08/05   DATE OF ADMISSION:  04/24/2005  DATE OF DISCHARGE:  04/28/2005                                 DISCHARGE SUMMARY   DIAGNOSES AT TIME OF DISCHARGE.:  1.  Abdominal pain likely diverticulitis versus ischemic colitis.  2.  Essential hypertension.  3.  Hypothyroidism.  4.  Irritable bowel syndrome.   HISTORY OF PRESENT ILLNESS:  Ms. Panameno  is a 77 year old well-known to our  office admitted by my partner out of the office with 4 days of lower  abdominal pain. Evidently she had not had a bowel movement in 2-3 days, was  seen in the office and had exquisite tenderness upon examination. Abdominal  CT and pelvis done stat at [facility] revealed infectious colitis versus  ischemic colitis with questionable pneumatosis and micro perforation.  Gallbladder was also noted to be distended. She was sent to the emergency  room for subsequent admission and general surgical consultation. For further  details see the dictated summary on the chart.   DATA:  CBC:  hemoglobin 13.6, hematocrit 38.5 white blood count 4.9,  platelet count. 328,000. Chemistries:  sodium 142, potassium 3.7, chloride  111, CO2 24, glucose 117, BUN 3, creatinine 0.6, calcium 5.8, albumin 3.3,  SGOT 17, SGPT 21 alk phos 125, bilirubin 0.6. PT was 13.9, INR 1.1.  Urinalysis was negative. Blood cultures were negative. CT outpatient showed  diffuse thickening of the sigmoid colon with mesenteric stranding and  questionable pneumatosis. Incidentally, colonoscopy 2002 was negative by  [facility].   She was admitted to the floor, seen in consultation by Dr. Ovidio Kin  aggressively hydrated, bowel rested and placed on empiric Cipro and  metronidazole. She actually gradually improved and  did not require  exploratory laparotomy. All of her abdominal symptoms subsided within 48  hours. Diet was advanced to a liquid diet. On the morning of April 28, 2005 she was totally symptom free with benign abdominal exam, tolerating  liquids extremely well. She is currently converted to oral antibiotics, oral  Cipro and metronidazole and awaits repeat CT this morning. Pending this  repeat CT this morning she will most likely be discharged home for further  as an outpatient.   The patient will resume home medications including her Diovan 80 mg daily,  Synthroid 0.125 p.o. q.d. calcium with vitamin D b.i.d. and multivitamin one  daily as well as Cipro 500 b.i.d. times 7 days of metronidazole 500 b.i.d.  times 7 days. She was instructed on a bland diet, pushing fluids  aggressively, and will follow up in the office in 2 weeks. She is discharged  in improved and stable condition.           ______________________________  Geoffry Paradise, M.D.     RA/MEDQ  D:  04/28/2005  T:  04/28/2005  Job:  [id number]

## 2010-08-16 NOTE — Op Note (Signed)
[place]  Patient:    Heather [name], Heather [name] Visit Number: [id number] MRN: [id number]          Service Type: END Location: ENDO Attending Physician:  Nelda Marseille Dictated by:   Petra Kuba, M.D. Proc. Date: 12/16/00 Admit Date:  12/16/2000   CC:         Richard A. [name], M.D.   Operative Report  PROCEDURE:  Esophagogastroduodenoscopy with Savary dilatation.  INDICATION:  Patient with persistent dysphagia not responding to the usual medicines.  Consent was signed after risks, benefits, methods, and options thoroughly discussed with both the patient and her husband.  MEDICATIONS:  Demerol 50 mg, Versed 5 mg.  DESCRIPTION OF PROCEDURE:  The video endoscope was inserted by direct vision. A quick look at the posterior pharynx was normal.  The esophagus was normal, was slightly tortuous.  There was a tiny to small hiatal hernia distally but no signs of esophagitis.  The scope passed into the stomach and then through a normal pylorus into the duodenal bulb and around the C-loop.  The ampulla was seen.  Normal second portion of the duodenum was seen.  The ampulla was normal. The scope was slowly withdrawn back to the bulb, which was pertinent for some mild bulbitis.  The scope was withdrawn back to the stomach and retroflexed.  Cardia and fundus, angularis, lesser and greater curve were normal on retroflex visualization.  Straight visualization of the stomach showed some minimal antritis but no additional findings in the lesser and greater curve.  The scope was slowly withdrawn back to 20 cm.  No additional findings were seen.  The scope was then advanced to the antrum.  The Savary wire was advanced.  The customary J loop of the wire in the antrum was confirmed fluoroscopically.  The scope was removed, making sure to keep the wire in the proper position and in the customary fashion once the scope was removed, the Savary dilators 12.8, 14,  and 15 were all advanced into the stomach, confirmed in the proper position under fluoroscopy.  There was no heme or resistance in passing any dilators.  After the 15 was advanced into the stomach, the wire was withdrawn back into the dilator, and the dilator and the wire were removed.  The patient did have some discomfort in passing the last two dilators and that is why we stopped, and she was beginning to wake up.  There was no obvious immediate complication.  The patient tolerated the procedure adequately.  ENDOSCOPIC DIAGNOSES: 1. Tiny to small hiatal hernia with a slightly tortuous esophagus. 2. Minimal antritis, bulbitis. 3. Otherwise normal EGD.  THERAPY:  Savary dilatation to 15 mm without heme or resistance.  PLAN:  See how the dilation works.  Possibly try the new medicine _____, which may help with her upper tract symptoms as well as her gas, bloating, and constipation.  Be happy to see her back p.r.n. or in one to two months to decide on the above.Dictated by:   Petra Kuba, M.D. Attending Physician:  Nelda Marseille DD:  12/16/00 TD:  12/17/00 Job: [id number]

## 2010-08-16 NOTE — Consult Note (Signed)
NAMESHRONDA, BOEH               ACCOUNT NO.:  [id number]   MEDICAL RECORD NO.:  [id number]          PATIENT TYPE:  INP   LOCATION:  6737                         FACILITY:  [facility]   PHYSICIAN:  Sandria Bales. Ezzard Standing, M.D.  DATE OF BIRTH:  29-Jan-1927   DATE OF CONSULTATION:  04/24/2005  DATE OF DISCHARGE:                                   CONSULTATION   REASON FOR CONSULTATION:  Lower abdominal pain.   HISTORY OF PRESENT ILLNESS:  This is a 77 year old white female who has  actually been in fairly good health until this past Monday, April 21, 2005, when she developed a lower abdominal pain.  Each day, the pain has  gotten a little bit worse, her last regular bowel movement was maybe about  Sunday, January 21.  She started having some loose stools today.  Because of  worsening pain, she saw Dr. Timothy Lasso who was covering for Dr. [name] today.  Dr. Timothy Lasso ordered a CT scan at [facility].  The CT scan report suggested  diffuse thickening of sigmoid colon with questionable pneumatosis suggesting  ischemic colitis.   The patient has a history of a hiatal hernia with reflux but really does not  take much regular medicine for this.  She has had no liver disease,  gallbladder disease, pancreatic disease.  She had a negative colonoscopy by  Dr. Leary Roca in 2002.  Her prior abdominal surgeries includes an open  appendectomy in the 1940s and then she had a hysterectomy through a lower  midline incision for benign disease in 1971.  She does have, she says,  digestion problems that are chronic.  I guess she is really not on any  medicines for that, at all.   ALLERGIES:  She has no allergies.   CURRENT MEDICATIONS:  Synthroid 0.125 mg daily, Diovan for blood pressure.   REVIEW OF SYMPTOMS:  NEUROLOGIC:  No history of seizures or loss of  consciousness.  PULMONARY:  She does not smoke cigarettes, no history of pneumonia or  tuberculosis.  CARDIAC:  She has been hypertensive for two years but  no chest pain, angina,  or irregular heart beat.  GASTROINTESTINAL:  See history of present illness.  UROLOGIC:  No history of  kidney stones or kidney infections.  MUSCULOSKELETAL:  She has had left hip fixed and Ray cages to her back in  the past.   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  She has two children, 91 and 18 years of age, who live in the  region.   PHYSICAL EXAMINATION:  VITAL SIGNS:  Pulse 84 and regular, she is afebrile, I do not have a blood  pressure on her.  GENERAL:  She is a well nourished older white female, alert and cooperative  on exam.  HEENT:  Unremarkable.  NECK:  Supple, I feel no masses, no thyromegaly.  LUNGS:  Clear to auscultation with symmetric breath sounds.  HEART:  Regular rate and rhythm, I hear no murmur or rub.  BREASTS:  I did not examine her breasts,  she has had regular mammograms.  ABDOMEN:  She has tenderness, particularly the left lower quadrant more than  right lower quadrant but she has active bowel sounds, she has no rebound,  maybe minimal guarding.  EXTREMITIES:  She has good strength in all four extremities.  NEUROLOGICAL:  Grossly intact.   LABORATORY DATA:  Outside labs from [facility] show a hemoglobin 15,  hematocrit 43, white blood cell count 11,000.  That is the only labs that I  have on her.   DIAGNOSIS:  1.  Questionable ischemic sigmoid colitis versus diverticulitis.  I think      the first step is to keep the patient n.p.o., place her on IV fluids,      antibiotics, and see how she does.  I discussed with her the possibility      that if she gets worsening symptoms or does not progress, she is going      to need a laparotomy with bowel resection and possible colostomy.  She      also understands if this resolves, I encourage her to get a follow up      colonoscopy with Dr. Ewing Schlein within maybe 6-12 weeks post resolution.  2.  Hypothyroidism, corrected.  3.  Hypertension.   4.  History of prior hip and back operations.      Sandria Bales. Ezzard Standing, M.D.  Electronically Signed     DHN/MEDQ  D:  04/24/2005  T:  04/24/2005  Job:  [id number]   cc:   Geoffry Paradise, M.D.  Fax: [phone]   Gwen Pounds, MD  Fax: [phone]   Petra Kuba, M.D.  Fax: [phone]

## 2010-08-16 NOTE — Discharge Summary (Signed)
[name], PO               ACCOUNT NO.:  [id number]   MEDICAL RECORD NO.:  [id number]          PATIENT TYPE:  INP   LOCATION:  1535                         FACILITY:  [facility]   PHYSICIAN:  Anselm Pancoast. Weatherly, M.D.DATE OF BIRTH:  1927-03-22   DATE OF ADMISSION:  11/10/2006  DATE OF DISCHARGE:  11/17/2006                               DISCHARGE SUMMARY   DISCHARGE DIAGNOSIS:  Colostomy approximately 4 months status post  perforated sigmoid colon with diverticulitis.   OPERATION:  Takedown colostomy, sigmoid colectomy, and low anterior  anastomosis under general anesthesia.   HISTORY:  Heather [name] is a 77 year old female who presented in March  to the emergency room with severe constipation.  She had had problems  with kind of a pneumonia for about a month and had been on antibiotics  for the infection and basically developed generalized abdominal pain and  was found to have an extreme constipation and kind of a perforated  sigmoid diverticulum or possibly a stercoral-type ulcer and underwent a  sigmoid colectomy, end-colostomy, and Hartmann closure.  She did  satisfactory following that surgery and has been emptying her colostomy  now for approximately 4 months.  Was studied with barium enema prior to  this readmission and shown to still have some areas of diverticulosis in  the most distal portion of her sigmoid and is now here for takedown of  colostomy and completion of her sigmoid colectomy.  The patient was  taken to surgery on the November 10, 2006.  Dr. Carolynne Edouard assisted, and we did  the completion, removal of the sigmoid, did a handsewn end-to-end  anastomosis from the descending colon to the rectum.  Postoperatively,  she had an NG tube for approximately 2-3 days, as there were extensive  adhesions.  It was necessary to cover her with antibiotics for  approximately 24 hours.  She then started having some bowel function,  and the NG tube was removed and her diet was  started.  She is having  bowel function.  Her incision appears to be healing satisfactory, and  her staples were removed and the wound Steri-Stripped.  She is now ready  for discharge on approximately the 6th day following surgery.  I will  see her in the office for followup, and she will continue on her chronic  medications which are Synthroid 137 mcg daily plus vitamins and has  Vicodin for pain.  Her path report showed sigmoid diverticulosis and  fibrosis consistent with previous diverticulitis.           ______________________________  Anselm Pancoast. Zachery Dakins, M.D.     WJW/MEDQ  D:  12/03/2006  T:  12/03/2006  Job:  [id number]   cc:   Geoffry Paradise, M.D.  Fax: [phone]

## 2010-10-30 DIAGNOSIS — Z9289 Personal history of other medical treatment: Secondary | ICD-10-CM

## 2010-10-30 HISTORY — DX: Personal history of other medical treatment: Z92.89

## 2011-01-09 LAB — COMPREHENSIVE METABOLIC PANEL
AST: 24
Albumin: 4
Chloride: 105
Creatinine, Ser: 0.74
GFR calc Af Amer: >60
Potassium: 4.1
Total Bilirubin: 1.3 — ABNORMAL HIGH
Total Protein: 6.4

## 2011-01-09 LAB — DIFFERENTIAL
Basophils Relative: 0
Eosinophils Absolute: 0.1
Monocytes Absolute: 0.4
Monocytes Relative: 6

## 2011-01-09 LAB — URINALYSIS, ROUTINE W REFLEX MICROSCOPIC
Nitrite: NEGATIVE
Specific Gravity, Urine: 1.009
Urobilinogen, UA: 0.2

## 2011-01-09 LAB — URINE MICROSCOPIC-ADD ON

## 2011-01-09 LAB — CBC
MCV: 92.3
Platelets: 285
WBC: 6.4

## 2011-01-10 LAB — BASIC METABOLIC PANEL
BUN: 2 — ABNORMAL LOW
CO2: 33 — ABNORMAL HIGH
Calcium: 8.5
GFR calc non Af Amer: >60
Glucose, Bld: 136 — ABNORMAL HIGH

## 2011-01-10 LAB — CBC
HCT: 34.1 — ABNORMAL LOW
MCHC: 34.9
Platelets: 215
RDW: 14.6 — ABNORMAL HIGH

## 2011-01-13 LAB — DIFFERENTIAL
Basophils Relative: 0
Eosinophils Absolute: 0.1
Eosinophils Relative: 2
Lymphs Abs: 2
Monocytes Absolute: 0.4
Monocytes Relative: 6
Neutrophils Relative %: 59

## 2011-01-13 LAB — COMPREHENSIVE METABOLIC PANEL
ALT: 19
AST: 24
Albumin: 4
Alkaline Phosphatase: 129 — ABNORMAL HIGH
Calcium: 9.1
GFR calc Af Amer: >60
Glucose, Bld: 107 — ABNORMAL HIGH
Potassium: 4.1
Sodium: 143
Total Protein: 6.6

## 2011-01-13 LAB — CBC
Hemoglobin: 14.7
MCHC: 34.7
Platelets: 240
RBC: 4.59
RDW: 14.3 — ABNORMAL HIGH
RDW: 14.6 — ABNORMAL HIGH

## 2011-01-13 LAB — BASIC METABOLIC PANEL
BUN: 9
CO2: 28
Calcium: 8.1 — ABNORMAL LOW
Creatinine, Ser: 0.72
GFR calc non Af Amer: >60
Glucose, Bld: 157 — ABNORMAL HIGH

## 2011-05-02 HISTORY — PX: TRANSTHORACIC ECHOCARDIOGRAM: SHX275

## 2011-05-21 ENCOUNTER — Other Ambulatory Visit: Payer: Self-pay | Admitting: Internal Medicine

## 2011-05-21 DIAGNOSIS — Z1231 Encounter for screening mammogram for malignant neoplasm of breast: Secondary | ICD-10-CM

## 2011-06-04 ENCOUNTER — Other Ambulatory Visit: Payer: Self-pay | Admitting: Internal Medicine

## 2011-06-04 DIAGNOSIS — R1900 Intra-abdominal and pelvic swelling, mass and lump, unspecified site: Secondary | ICD-10-CM

## 2011-06-09 ENCOUNTER — Ambulatory Visit
Admission: RE | Admit: 2011-06-09 | Discharge: 2011-06-09 | Disposition: A | Discharge status: Home / Self Care | Payer: Medicare Other | Source: Ambulatory Visit | Attending: Internal Medicine | Admitting: Internal Medicine

## 2011-06-09 DIAGNOSIS — R1900 Intra-abdominal and pelvic swelling, mass and lump, unspecified site: Secondary | ICD-10-CM

## 2011-06-27 ENCOUNTER — Ambulatory Visit
Admission: RE | Admit: 2011-06-27 | Discharge: 2011-06-27 | Disposition: A | Discharge status: Home / Self Care | Payer: Medicare Other | Source: Ambulatory Visit | Attending: Internal Medicine | Admitting: Internal Medicine

## 2011-06-27 DIAGNOSIS — Z1231 Encounter for screening mammogram for malignant neoplasm of breast: Secondary | ICD-10-CM

## 2011-06-30 ENCOUNTER — Ambulatory Visit (INDEPENDENT_AMBULATORY_CARE_PROVIDER_SITE_OTHER): Payer: MEDICARE | Admitting: General Surgery

## 2011-07-01 ENCOUNTER — Encounter (INDEPENDENT_AMBULATORY_CARE_PROVIDER_SITE_OTHER): Payer: Self-pay | Admitting: General Surgery

## 2011-07-01 ENCOUNTER — Ambulatory Visit (INDEPENDENT_AMBULATORY_CARE_PROVIDER_SITE_OTHER): Payer: Medicare Other | Admitting: General Surgery

## 2011-07-01 VITALS — BP 158/90 | HR 80 | Temp 97.0°F | Resp 16 | Ht 64.0 in | Wt 136.6 lb

## 2011-07-01 DIAGNOSIS — R1900 Intra-abdominal and pelvic swelling, mass and lump, unspecified site: Secondary | ICD-10-CM

## 2011-07-01 DIAGNOSIS — R222 Localized swelling, mass and lump, trunk: Secondary | ICD-10-CM

## 2011-07-01 NOTE — Progress Notes (Signed)
Subjective:     Patient ID: Heather [name], female   DOB: 1926/11/17, 78 y.o.   MRN: [id number]  HPI We are asked to see the patient in consultation by Dr. Geoffry Paradise to evaluate her for a abdominal wall mass. The patient is an 78 year old white female who has noticed a lump just above and to the right of her bellybutton the last several months. She occasionally gets abdominal discomfort but it does not necessarily seem to be centered around the lump. She has had some episodes of nausea but no vomiting. She denies any fevers or chills. She denies any abdominal bloating. Her bowels are moving regularly.  Review of Systems  Constitutional: Negative.   HENT: Negative.   Eyes: Negative.   Respiratory: Negative.   Cardiovascular: Negative.   Gastrointestinal: Positive for nausea.  Genitourinary: Negative.   Musculoskeletal: Negative.   Skin: Negative.   Neurological: Negative.   Hematological: Negative.   Psychiatric/Behavioral: Negative.        Objective:   Physical Exam  Constitutional: She is oriented to person, place, and time. She appears well-developed and well-nourished.  HENT:  Head: Normocephalic and atraumatic.  Eyes: Conjunctivae and EOM are normal. Pupils are equal, round, and reactive to light.  Neck: Normal range of motion. Neck supple.  Cardiovascular: Normal rate, regular rhythm and normal heart sounds.   Pulmonary/Chest: Effort normal and breath sounds normal.  Abdominal: Soft. Bowel sounds are normal. She exhibits no distension. There is no tenderness.  Musculoskeletal: Normal range of motion.  Neurological: She is alert and oriented to person, place, and time.  Skin: Skin is warm and dry.  Psychiatric: She has a normal mood and affect. Her behavior is normal.       Assessment:     The patient has an abdominal wall mass that seems to be nontender. The ultrasound states that they thought I saw peristalsis which would argue for hernia. I cannot feel a  palpable fascial defect. I would think that if this is a loop of intestine the fascial defect would have to be so small that this loop of intestine would be obstructed she does not appear to be obstructed. At this point I think she would be best evaluated with a CT scan. This should be able to distinguish any lipoma for example from a ventral hernia. We will call her with the results of the study and then proceed accordingly.    Plan:     Plan for CT scan of the abdomen and pelvis

## 2011-07-08 ENCOUNTER — Ambulatory Visit
Admission: RE | Admit: 2011-07-08 | Discharge: 2011-07-08 | Disposition: A | Discharge status: Home / Self Care | Payer: Medicare Other | Source: Ambulatory Visit | Attending: General Surgery | Admitting: General Surgery

## 2011-07-08 DIAGNOSIS — R222 Localized swelling, mass and lump, trunk: Secondary | ICD-10-CM

## 2011-07-08 MED ORDER — IOHEXOL 300 MG/ML  SOLN
100.0000 mL | Freq: Once | INTRAMUSCULAR | Status: AC | PRN
  Administered 2011-07-08: 100 mL via INTRAVENOUS

## 2011-07-24 ENCOUNTER — Encounter (INDEPENDENT_AMBULATORY_CARE_PROVIDER_SITE_OTHER): Payer: Self-pay | Admitting: General Surgery

## 2011-07-31 ENCOUNTER — Encounter (INDEPENDENT_AMBULATORY_CARE_PROVIDER_SITE_OTHER): Payer: Self-pay | Admitting: General Surgery

## 2011-07-31 ENCOUNTER — Ambulatory Visit (INDEPENDENT_AMBULATORY_CARE_PROVIDER_SITE_OTHER): Payer: Medicare Other | Admitting: General Surgery

## 2011-07-31 VITALS — BP 120/83 | HR 74 | Temp 98.4°F | Ht 65.0 in | Wt 133.0 lb

## 2011-07-31 DIAGNOSIS — K439 Ventral hernia without obstruction or gangrene: Secondary | ICD-10-CM

## 2011-07-31 NOTE — Patient Instructions (Signed)
We will plan to get cardiac clearance from Dr. Rennis Golden

## 2011-08-06 ENCOUNTER — Other Ambulatory Visit (INDEPENDENT_AMBULATORY_CARE_PROVIDER_SITE_OTHER): Payer: Self-pay | Admitting: General Surgery

## 2011-08-06 ENCOUNTER — Telehealth (INDEPENDENT_AMBULATORY_CARE_PROVIDER_SITE_OTHER): Payer: Self-pay | Admitting: General Surgery

## 2011-08-06 NOTE — Telephone Encounter (Signed)
Spoke with the pt and informed her we had cardiac clearance from her cardiologist.  He said that she could stop her coumadin 5 days before sx.  I also let her know that our schedulers should be calling her today or tomorrow to set up a good date for her hernia sx.

## 2011-08-07 ENCOUNTER — Encounter (INDEPENDENT_AMBULATORY_CARE_PROVIDER_SITE_OTHER): Payer: Self-pay | Admitting: General Surgery

## 2011-08-07 ENCOUNTER — Telehealth (INDEPENDENT_AMBULATORY_CARE_PROVIDER_SITE_OTHER): Payer: Self-pay | Admitting: General Surgery

## 2011-08-07 NOTE — Progress Notes (Signed)
Subjective:     Patient ID: Heather [name], female   DOB: 1926-08-17, 78 y.o.   MRN: [id number]  HPI The patient is an 78 year old female who we saw her recently with a bulge just above the umbilicus. Her exam was consistent with a ventral hernia. Since her last visit we had her undergo a CT scan to make sure there was no involvement of intestine into the hernia and there was not. She does have some occasional discomfort associated with the hernia. She denies any nausea or vomiting.  Review of Systems     Objective:   Physical Exam On exam her abdomen is soft and nontender. She does have a bulge just above the umbilicus and to the right. No evidence of obstruction.    Assessment:     Ventral hernia involving only some omental fat    Plan:     Because of the risk of incarceration and strangulation I think she may benefit from having the hernia fixed. She is interested in doing this. I have discussed with her in detail the risks and benefits of the operation to fix the hernia as well as some of the technical aspects including the possibility of using mesh and she understands and wishes to proceed. We will plan to get cardiac clearance prior to proceeding with surgery.

## 2011-08-07 NOTE — Telephone Encounter (Signed)
Called pt and told her that we had her cardiac clearance and our scheduler will be giving her a call to get her schedule

## 2011-08-12 ENCOUNTER — Encounter (INDEPENDENT_AMBULATORY_CARE_PROVIDER_SITE_OTHER): Payer: Self-pay | Admitting: General Surgery

## 2011-09-01 ENCOUNTER — Encounter (INDEPENDENT_AMBULATORY_CARE_PROVIDER_SITE_OTHER): Payer: Self-pay | Admitting: General Surgery

## 2011-09-01 ENCOUNTER — Ambulatory Visit (INDEPENDENT_AMBULATORY_CARE_PROVIDER_SITE_OTHER): Payer: MEDICARE | Admitting: General Surgery

## 2011-09-01 VITALS — BP 138/84 | HR 72 | Temp 97.9°F | Ht 65.0 in | Wt 128.0 lb

## 2011-09-01 DIAGNOSIS — K439 Ventral hernia without obstruction or gangrene: Secondary | ICD-10-CM

## 2011-09-01 NOTE — Progress Notes (Signed)
Subjective:     Patient ID: Heather [name], female   DOB: 09/15/1926, 78 y.o.   MRN: [id number]  HPI The patient is an 78-year-old white female with a small ventral hernia containing only fat. She is scheduled for surgery to fix the hernia on June 21. We are planning a laparoscopic ventral hernia repair with mesh. She comes in today for a preop visit. She only has some occasional discomfort associated with the hernia. No signs of obstruction.  Review of Systems  Constitutional: Negative.   HENT: Negative.   Eyes: Negative.   Respiratory: Negative.   Cardiovascular: Negative.   Gastrointestinal: Positive for abdominal pain.  Genitourinary: Negative.   Musculoskeletal: Negative.   Skin: Negative.   Neurological: Negative.   Hematological: Negative.   Psychiatric/Behavioral: Negative.        Objective:   Physical Exam  Constitutional: She is oriented to person, place, and time. She appears well-developed and well-nourished.  HENT:  Head: Normocephalic and atraumatic.  Eyes: Conjunctivae and EOM are normal. Pupils are equal, round, and reactive to light.  Neck: Normal range of motion. Neck supple.  Cardiovascular: Normal rate, regular rhythm and normal heart sounds.   Pulmonary/Chest: Effort normal and breath sounds normal.  Abdominal: Soft. Bowel sounds are normal.       There is a small soft bulge just above the umbilicus that does not reduce. There is only some mild discomfort associated with it. No abdominal distention.  Musculoskeletal: Normal range of motion.  Neurological: She is alert and oriented to person, place, and time.  Skin: Skin is warm and dry.  Psychiatric: She has a normal mood and affect. Her behavior is normal.       Assessment:     There is a small ventral hernia. There is no sign of obstruction.    Plan:     The plan is for a laparoscopic ventral hernia repair with mesh. Have discussed with the patient in detail the risks and benefits of the operation  as well as some of the technical aspects and she understands and wishes to proceed. Surgery date is scheduled for June 21.      

## 2011-09-01 NOTE — Patient Instructions (Signed)
Surgery scheduled for 6/21. Call if you have any other questions

## 2011-09-04 ENCOUNTER — Encounter (HOSPITAL_COMMUNITY): Payer: Self-pay | Admitting: Pharmacy Technician

## 2011-09-12 ENCOUNTER — Inpatient Hospital Stay (HOSPITAL_COMMUNITY): Admission: RE | Admit: 2011-09-12 | Payer: Medicare Other | Source: Ambulatory Visit

## 2011-09-12 ENCOUNTER — Encounter (HOSPITAL_COMMUNITY): Payer: Self-pay | Admitting: *Deleted

## 2011-09-12 NOTE — Pre-Procedure Instructions (Signed)
20 Heather [name]  09/12/2011   Your procedure is scheduled on:  09/19/11  Report to [facility] at 5:30 AM.  Call this number if you have problems the morning of surgery: [phone]   Remember: Discontinue Aspirin, Coumadin, Plavix, Effient and herbal medications.   Do not eat food or drink liquids after midnight.    Take these medicines the morning of surgery with A SIP OF WATER: Cardizem/Diltiazem, Levothyroxine/Synthroid   Do not wear jewelry, make-up or nail polish.  Do not wear lotions, powders, or perfumes. You may wear deodorant.  Do not shave 48 hours prior to surgery. Men may shave face and neck.  Do not bring valuables to the hospital.  Contacts, dentures or bridgework may not be worn into surgery.  Leave suitcase in the car. After surgery it may be brought to your room.  For patients admitted to the hospital, checkout time is 11:00 AM the day of discharge.   Patients discharged the day of surgery will not be allowed to drive home.    Special Instructions: CHG Shower Use Special Wash: 1/2 bottle night before surgery and 1/2 bottle morning of surgery.   Please read over the following fact sheets that you were given: Pain Booklet, Coughing and Deep Breathing, MRSA Information and Surgical Site Infection Prevention

## 2011-09-12 NOTE — Progress Notes (Signed)
Note to Sherie Don to request from Dr. Rennis Golden office last ov note, EKG, stress test patient states done within last  Yr or so.  Patient states she has had CXR about 6 wks ago and also EKG in last year -both at Dr. Lanell Matar office. Note for Tarra to request.

## 2011-09-15 ENCOUNTER — Encounter (HOSPITAL_COMMUNITY): Payer: Self-pay

## 2011-09-15 ENCOUNTER — Encounter (HOSPITAL_COMMUNITY)
Admission: RE | Admit: 2011-09-15 | Discharge: 2011-09-15 | Disposition: A | Discharge status: Home / Self Care | Payer: Medicare Other | Source: Ambulatory Visit | Attending: General Surgery | Admitting: General Surgery

## 2011-09-15 ENCOUNTER — Ambulatory Visit (HOSPITAL_COMMUNITY): Admission: RE | Admit: 2011-09-15 | Payer: Medicare Other | Source: Ambulatory Visit

## 2011-09-15 LAB — BASIC METABOLIC PANEL
BUN: 12 mg/dL (ref 6–23)
Calcium: 9.9 mg/dL (ref 8.4–10.5)
Creatinine, Ser: 0.66 mg/dL (ref 0.50–1.10)
GFR calc non Af Amer: 80 mL/min — ABNORMAL LOW (ref >90)
Glucose, Bld: 99 mg/dL (ref 70–99)
Sodium: 137 mEq/L (ref 135–145)

## 2011-09-15 LAB — CBC
HCT: 43.4 % (ref 36.0–46.0)
Hemoglobin: 15.7 g/dL — ABNORMAL HIGH (ref 12.0–15.0)
RDW: 14.3 % (ref 11.5–15.5)
WBC: 9.3 10*3/uL (ref 4.0–10.5)

## 2011-09-15 LAB — PROTIME-INR
INR: 2.57 — ABNORMAL HIGH (ref 0.00–1.49)
Prothrombin Time: 28 seconds — ABNORMAL HIGH (ref 11.6–15.2)

## 2011-09-15 LAB — APTT: aPTT: 43 seconds — ABNORMAL HIGH (ref 24–37)

## 2011-09-15 MED ORDER — CHLORHEXIDINE GLUCONATE 4 % EX LIQD: 1.0000 "application " | Freq: Once | CUTANEOUS | Status: DC

## 2011-09-16 NOTE — Consult Note (Signed)
Anesthesia Chart Review:  Patient is a 78 year old female scheduled for a laparoscopic VHR on 09/19/11.  History includes former smoker, PAF/afib, COPD, HTN, hypothyroidism, osteoporosis, hiatal hernia, venous insufficiency, GERD, PNA, arthritis, peripheral neuropathy, sigmoid colectomy with colostomy on 06/19/06 for perforated viscus (s/p takedown 11/10/06).  PCP is Dr. [name].  Her Cardiologist is Dr. Rennis Golden [facility]).  He last saw her on 06/10/11.  EKG there on 05/14/11 showed NSR with sinus arrhythmia.    An echo on 05/28/11 showed mild LVH, normal LV systolic function, impaired LV relaxation, trace MR, mild mitral annular calcification, mild TR, normal RV systolic function, mild aortic sclerosis with mild AR.    Stress test on 11/19/10 showed normal myocardial perfusion, no inducible ischemia, low risk scan.  CXR on 08/05/11 (Dr. [name]) showed hyperexpansion with no acute disease.   Labs noted.  PT/PTT are elevated.  She recently stopped her Coumadin in anticipation for surgery.  Will check a repeat PT/PTT on arrival.  Shonna Chock, PA-C

## 2011-09-17 ENCOUNTER — Telehealth (INDEPENDENT_AMBULATORY_CARE_PROVIDER_SITE_OTHER): Payer: Self-pay | Admitting: General Surgery

## 2011-09-17 NOTE — Telephone Encounter (Signed)
Called patient to let her know that her first PO appt with Dr. Carolynne Edouard after surgery will be on 7/16 at 10:30.  I also sent a reminder card in the mail.

## 2011-09-18 ENCOUNTER — Telehealth (INDEPENDENT_AMBULATORY_CARE_PROVIDER_SITE_OTHER): Payer: Self-pay

## 2011-09-18 MED ORDER — CEFAZOLIN SODIUM-DEXTROSE 2-3 GM-% IV SOLR
2.0000 g | INTRAVENOUS | Status: AC
  Administered 2011-09-19: 2 g via INTRAVENOUS
  Filled 2011-09-18: qty 50

## 2011-09-18 NOTE — Telephone Encounter (Signed)
LMOM at home and cell to call me today so I can find out if pt has stopped taking her Coumadin before surgery with Dr Carolynne Edouard on 09/19/11.

## 2011-09-18 NOTE — Telephone Encounter (Signed)
Pt returned my call and she did stop her Coumadin on Sunday so she is ready to proceed with surgery on 6/21.

## 2011-09-19 ENCOUNTER — Encounter (HOSPITAL_COMMUNITY): Payer: Self-pay | Admitting: Vascular Surgery

## 2011-09-19 ENCOUNTER — Encounter (HOSPITAL_COMMUNITY)
Admission: RE | Disposition: A | Discharge status: Home / Self Care | Payer: Self-pay | Source: Ambulatory Visit | Attending: General Surgery

## 2011-09-19 ENCOUNTER — Encounter (HOSPITAL_COMMUNITY): Payer: Self-pay | Admitting: Surgery

## 2011-09-19 ENCOUNTER — Ambulatory Visit (HOSPITAL_COMMUNITY)
Admission: RE | Admit: 2011-09-19 | Discharge: 2011-09-22 | Disposition: A | Discharge status: Home / Self Care | Payer: Medicare Other | Source: Ambulatory Visit | Attending: General Surgery | Admitting: General Surgery

## 2011-09-19 ENCOUNTER — Encounter (HOSPITAL_COMMUNITY): Payer: Self-pay | Admitting: *Deleted

## 2011-09-19 ENCOUNTER — Ambulatory Visit (HOSPITAL_COMMUNITY): Payer: Medicare Other | Admitting: Vascular Surgery

## 2011-09-19 DIAGNOSIS — K439 Ventral hernia without obstruction or gangrene: Secondary | ICD-10-CM | POA: Insufficient documentation

## 2011-09-19 DIAGNOSIS — J4489 Other specified chronic obstructive pulmonary disease: Secondary | ICD-10-CM | POA: Insufficient documentation

## 2011-09-19 DIAGNOSIS — Z01812 Encounter for preprocedural laboratory examination: Secondary | ICD-10-CM | POA: Insufficient documentation

## 2011-09-19 DIAGNOSIS — J449 Chronic obstructive pulmonary disease, unspecified: Secondary | ICD-10-CM | POA: Insufficient documentation

## 2011-09-19 DIAGNOSIS — I10 Essential (primary) hypertension: Secondary | ICD-10-CM | POA: Insufficient documentation

## 2011-09-19 DIAGNOSIS — J701 Chronic and other pulmonary manifestations due to radiation: Secondary | ICD-10-CM | POA: Insufficient documentation

## 2011-09-19 HISTORY — DX: Gastro-esophageal reflux disease without esophagitis: K21.9

## 2011-09-19 HISTORY — DX: Pneumonia, unspecified organism: J18.9

## 2011-09-19 HISTORY — PX: VENTRAL HERNIA REPAIR: SHX424

## 2011-09-19 HISTORY — DX: Shortness of breath: R06.02

## 2011-09-19 HISTORY — DX: Personal history of other diseases of the digestive system: Z87.19

## 2011-09-19 HISTORY — DX: Unspecified osteoarthritis, unspecified site: M19.90

## 2011-09-19 LAB — PROTIME-INR
INR: 1.22 (ref 0.00–1.49)
Prothrombin Time: 15.7 seconds — ABNORMAL HIGH (ref 11.6–15.2)

## 2011-09-19 SURGERY — REPAIR, HERNIA, VENTRAL, LAPAROSCOPIC
Anesthesia: General | Site: Abdomen | Wound class: Clean

## 2011-09-19 MED ORDER — MORPHINE SULFATE 2 MG/ML IJ SOLN
INTRAMUSCULAR | Status: AC
  Filled 2011-09-19: qty 1

## 2011-09-19 MED ORDER — HYDROMORPHONE HCL PF 1 MG/ML IJ SOLN
0.2500 mg | INTRAMUSCULAR | Status: DC | PRN
  Administered 2011-09-19 (×3): 0.5 mg via INTRAVENOUS

## 2011-09-19 MED ORDER — PANTOPRAZOLE SODIUM 40 MG IV SOLR
40.0000 mg | INTRAVENOUS | Status: DC
  Administered 2011-09-19: 40 mg via INTRAVENOUS
  Filled 2011-09-19 (×2): qty 40

## 2011-09-19 MED ORDER — CALCIUM CARBONATE-VITAMIN D 500-200 MG-UNIT PO TABS
1.0000 | ORAL_TABLET | Freq: Every day | ORAL | Status: DC
Start: 2011-09-19 — End: 2011-09-22
  Administered 2011-09-19 – 2011-09-22 (×4): 1 via ORAL
  Filled 2011-09-19 (×4): qty 1

## 2011-09-19 MED ORDER — BUPIVACAINE-EPINEPHRINE 0.25% -1:200000 IJ SOLN
INTRAMUSCULAR | Status: DC | PRN
  Administered 2011-09-19: 8 mL

## 2011-09-19 MED ORDER — LIDOCAINE HCL (CARDIAC) 20 MG/ML IV SOLN
INTRAVENOUS | Status: DC | PRN
  Administered 2011-09-19: 100 mg via INTRAVENOUS

## 2011-09-19 MED ORDER — GLYCOPYRROLATE 0.2 MG/ML IJ SOLN
INTRAMUSCULAR | Status: DC | PRN
  Administered 2011-09-19: .5 mg via INTRAVENOUS

## 2011-09-19 MED ORDER — ACETAMINOPHEN 10 MG/ML IV SOLN
INTRAVENOUS | Status: AC
  Filled 2011-09-19: qty 100

## 2011-09-19 MED ORDER — VITAMIN C 500 MG PO TABS
500.0000 mg | ORAL_TABLET | Freq: Every day | ORAL | Status: DC
  Administered 2011-09-19 – 2011-09-22 (×4): 500 mg via ORAL
  Filled 2011-09-19 (×4): qty 1

## 2011-09-19 MED ORDER — LOSARTAN POTASSIUM 50 MG PO TABS
100.0000 mg | ORAL_TABLET | Freq: Every day | ORAL | Status: DC
  Administered 2011-09-22: 100 mg via ORAL
  Filled 2011-09-19 (×4): qty 2

## 2011-09-19 MED ORDER — ROCURONIUM BROMIDE 100 MG/10ML IV SOLN
INTRAVENOUS | Status: DC | PRN
  Administered 2011-09-19: 40 mg via INTRAVENOUS
  Administered 2011-09-19: 10 mg via INTRAVENOUS

## 2011-09-19 MED ORDER — ONDANSETRON HCL 4 MG/2ML IJ SOLN
INTRAMUSCULAR | Status: AC
  Filled 2011-09-19: qty 2

## 2011-09-19 MED ORDER — HYDROMORPHONE HCL PF 1 MG/ML IJ SOLN
INTRAMUSCULAR | Status: AC
  Filled 2011-09-19: qty 1

## 2011-09-19 MED ORDER — DILTIAZEM HCL ER COATED BEADS 180 MG PO CP24
180.0000 mg | ORAL_CAPSULE | Freq: Every day | ORAL | Status: DC
  Administered 2011-09-19 – 2011-09-22 (×4): 180 mg via ORAL
  Filled 2011-09-19 (×4): qty 1

## 2011-09-19 MED ORDER — MORPHINE SULFATE 4 MG/ML IJ SOLN: 4.0000 mg | INTRAMUSCULAR | Status: DC | PRN

## 2011-09-19 MED ORDER — HYDROCHLOROTHIAZIDE 25 MG PO TABS
25.0000 mg | ORAL_TABLET | Freq: Every day | ORAL | Status: DC
  Administered 2011-09-19 – 2011-09-22 (×2): 25 mg via ORAL
  Filled 2011-09-19 (×4): qty 1

## 2011-09-19 MED ORDER — WARFARIN - PHYSICIAN DOSING INPATIENT: Freq: Every day | Status: DC

## 2011-09-19 MED ORDER — ONDANSETRON HCL 4 MG/2ML IJ SOLN
INTRAMUSCULAR | Status: DC | PRN
  Administered 2011-09-19: 4 mg via INTRAVENOUS

## 2011-09-19 MED ORDER — CALCIUM CITRATE-VITAMIN D 315-200 MG-UNIT PO TABS
1.0000 | ORAL_TABLET | Freq: Every day | ORAL | Status: DC
Start: 2011-09-19 — End: 2011-09-19

## 2011-09-19 MED ORDER — HYDROCODONE-ACETAMINOPHEN 5-325 MG PO TABS: 1.0000 | ORAL_TABLET | Freq: Four times a day (QID) | ORAL | Status: AC | PRN

## 2011-09-19 MED ORDER — MIDAZOLAM HCL 5 MG/5ML IJ SOLN
INTRAMUSCULAR | Status: DC | PRN
  Administered 2011-09-19: 1 mg via INTRAVENOUS

## 2011-09-19 MED ORDER — WARFARIN SODIUM 2.5 MG PO TABS
2.5000 mg | ORAL_TABLET | Freq: Every day | ORAL | Status: DC
  Administered 2011-09-19 – 2011-09-21 (×3): 2.5 mg via ORAL
  Filled 2011-09-19 (×4): qty 1

## 2011-09-19 MED ORDER — ONDANSETRON HCL 4 MG/2ML IJ SOLN
4.0000 mg | Freq: Four times a day (QID) | INTRAMUSCULAR | Status: DC | PRN
  Administered 2011-09-19 – 2011-09-21 (×3): 4 mg via INTRAVENOUS
  Filled 2011-09-19 (×3): qty 2

## 2011-09-19 MED ORDER — BUPIVACAINE-EPINEPHRINE PF 0.25-1:200000 % IJ SOLN
INTRAMUSCULAR | Status: AC
  Filled 2011-09-19: qty 30

## 2011-09-19 MED ORDER — LACTATED RINGERS IV SOLN
INTRAVENOUS | Status: DC | PRN
  Administered 2011-09-19: 07:00:00 via INTRAVENOUS

## 2011-09-19 MED ORDER — VITAMIN D 1000 UNITS PO TABS
2000.0000 [IU] | ORAL_TABLET | Freq: Every morning | ORAL | Status: DC
  Administered 2011-09-20 – 2011-09-22 (×3): 2000 [IU] via ORAL
  Filled 2011-09-19 (×4): qty 2

## 2011-09-19 MED ORDER — BUPIVACAINE HCL (PF) 0.25 % IJ SOLN
INTRAMUSCULAR | Status: AC
  Filled 2011-09-19: qty 30

## 2011-09-19 MED ORDER — ONDANSETRON HCL 4 MG PO TABS: 4.0000 mg | ORAL_TABLET | Freq: Four times a day (QID) | ORAL | Status: DC | PRN

## 2011-09-19 MED ORDER — NEOSTIGMINE METHYLSULFATE 1 MG/ML IJ SOLN
INTRAMUSCULAR | Status: DC | PRN
  Administered 2011-09-19: 3 mg via INTRAVENOUS

## 2011-09-19 MED ORDER — PHENYLEPHRINE HCL 10 MG/ML IJ SOLN
INTRAMUSCULAR | Status: DC | PRN
  Administered 2011-09-19 (×4): 40 ug via INTRAVENOUS

## 2011-09-19 MED ORDER — SODIUM CHLORIDE 0.9 % IR SOLN
Status: DC | PRN
  Administered 2011-09-19: 1000 mL

## 2011-09-19 MED ORDER — HYDROCODONE-ACETAMINOPHEN 5-325 MG PO TABS
1.0000 | ORAL_TABLET | ORAL | Status: DC | PRN
  Administered 2011-09-19 – 2011-09-21 (×7): 1 via ORAL
  Filled 2011-09-19 (×7): qty 1

## 2011-09-19 MED ORDER — FENTANYL CITRATE 0.05 MG/ML IJ SOLN
INTRAMUSCULAR | Status: DC | PRN
  Administered 2011-09-19: 50 ug via INTRAVENOUS
  Administered 2011-09-19: 25 ug via INTRAVENOUS
  Administered 2011-09-19: 150 ug via INTRAVENOUS
  Administered 2011-09-19: 25 ug via INTRAVENOUS

## 2011-09-19 MED ORDER — PROPOFOL 10 MG/ML IV EMUL
INTRAVENOUS | Status: DC | PRN
  Administered 2011-09-19: 200 mg via INTRAVENOUS

## 2011-09-19 MED ORDER — ONDANSETRON HCL 4 MG/2ML IJ SOLN
4.0000 mg | Freq: Once | INTRAMUSCULAR | Status: AC | PRN
  Administered 2011-09-19: 4 mg via INTRAVENOUS

## 2011-09-19 MED ORDER — KCL IN DEXTROSE-NACL 20-5-0.9 MEQ/L-%-% IV SOLN
INTRAVENOUS | Status: DC
  Administered 2011-09-19 – 2011-09-22 (×4): via INTRAVENOUS
  Filled 2011-09-19 (×7): qty 1000

## 2011-09-19 MED ORDER — LEVOTHYROXINE SODIUM 137 MCG PO TABS
137.0000 ug | ORAL_TABLET | Freq: Every day | ORAL | Status: DC
  Administered 2011-09-19 – 2011-09-22 (×4): 137 ug via ORAL
  Filled 2011-09-19 (×4): qty 1

## 2011-09-19 MED ORDER — DEXAMETHASONE SODIUM PHOSPHATE 4 MG/ML IJ SOLN
INTRAMUSCULAR | Status: DC | PRN
  Administered 2011-09-19: 4 mg via INTRAVENOUS

## 2011-09-19 MED ORDER — ACETAMINOPHEN 10 MG/ML IV SOLN
INTRAVENOUS | Status: DC | PRN
  Administered 2011-09-19: 1000 mg via INTRAVENOUS

## 2011-09-19 SURGICAL SUPPLY — 51 items
ADH SKN CLS APL DERMABOND .7 (GAUZE/BANDAGES/DRESSINGS) ×1
APPLIER CLIP LOGIC TI 5 (MISCELLANEOUS) IMPLANT
APPLIER CLIP ROT 10 11.4 M/L (STAPLE)
APR CLP MED LRG 11.4X10 (STAPLE)
APR CLP MED LRG 33X5 (MISCELLANEOUS)
BANDAGE GAUZE ELAST BULKY 4 IN (GAUZE/BANDAGES/DRESSINGS) IMPLANT
CANISTER SUCTION 2500CC (MISCELLANEOUS) IMPLANT
CHLORAPREP W/TINT 26ML (MISCELLANEOUS) ×3 IMPLANT
CLIP APPLIE ROT 10 11.4 M/L (STAPLE) IMPLANT
CLOTH BEACON ORANGE TIMEOUT ST (SAFETY) ×2 IMPLANT
COVER SURGICAL LIGHT HANDLE (MISCELLANEOUS) ×2 IMPLANT
DECANTER SPIKE VIAL GLASS SM (MISCELLANEOUS) ×1 IMPLANT
DERMABOND ADVANCED (GAUZE/BANDAGES/DRESSINGS) ×1
DERMABOND ADVANCED .7 DNX12 (GAUZE/BANDAGES/DRESSINGS) ×1 IMPLANT
DEVICE SECURE STRAP 25 ABSORB (INSTRUMENTS) ×2 IMPLANT
DEVICE TROCAR PUNCTURE CLOSURE (ENDOMECHANICALS) ×2 IMPLANT
DRAPE INCISE IOBAN 66X45 STRL (DRAPES) ×2 IMPLANT
DRAPE UTILITY 15X26 W/TAPE STR (DRAPE) ×4 IMPLANT
ELECT CAUTERY BLADE 6.4 (BLADE) ×2 IMPLANT
ELECT REM PT RETURN 9FT ADLT (ELECTROSURGICAL) ×2
ELECTRODE REM PT RTRN 9FT ADLT (ELECTROSURGICAL) ×1 IMPLANT
GLOVE BIO SURGEON STRL SZ7 (GLOVE) ×2 IMPLANT
GLOVE BIO SURGEON STRL SZ7.5 (GLOVE) ×2 IMPLANT
GLOVE BIOGEL PI IND STRL 7.0 (GLOVE) IMPLANT
GLOVE BIOGEL PI INDICATOR 7.0 (GLOVE) ×2
GLOVE EXAM NITRILE MD LF STRL (GLOVE) ×1 IMPLANT
GOWN STRL NON-REIN LRG LVL3 (GOWN DISPOSABLE) ×6 IMPLANT
KIT BASIN OR (CUSTOM PROCEDURE TRAY) ×2 IMPLANT
KIT ROOM TURNOVER OR (KITS) ×2 IMPLANT
NDL SPNL 22GX3.5 QUINCKE BK (NEEDLE) ×1 IMPLANT
NEEDLE SPNL 22GX3.5 QUINCKE BK (NEEDLE) ×2 IMPLANT
NS IRRIG 1000ML POUR BTL (IV SOLUTION) ×2 IMPLANT
PAD ARMBOARD 7.5X6 YLW CONV (MISCELLANEOUS) ×3 IMPLANT
PATCH VENTRAL MEDIUM 6.4 (Mesh Specialty) ×1 IMPLANT
PEN SKIN MARKING BROAD (MISCELLANEOUS) ×2 IMPLANT
PENCIL BUTTON HOLSTER BLD 10FT (ELECTRODE) ×2 IMPLANT
SCALPEL HARMONIC ACE (MISCELLANEOUS) ×1 IMPLANT
SCISSORS LAP 5X35 DISP (ENDOMECHANICALS) IMPLANT
SET IRRIG TUBING LAPAROSCOPIC (IRRIGATION / IRRIGATOR) IMPLANT
SLEEVE ENDOPATH XCEL 5M (ENDOMECHANICALS) ×2 IMPLANT
SUT MNCRL AB 4-0 PS2 18 (SUTURE) ×2 IMPLANT
SUT NOVA NAB DX-16 0-1 5-0 T12 (SUTURE) ×3 IMPLANT
SUT VIC AB 3-0 SH 27 (SUTURE) ×2
SUT VIC AB 3-0 SH 27XBRD (SUTURE) IMPLANT
TOWEL OR 17X24 6PK STRL BLUE (TOWEL DISPOSABLE) ×2 IMPLANT
TOWEL OR 17X26 10 PK STRL BLUE (TOWEL DISPOSABLE) ×2 IMPLANT
TRAY FOLEY CATH 14FR (SET/KITS/TRAYS/PACK) ×2 IMPLANT
TRAY LAPAROSCOPIC (CUSTOM PROCEDURE TRAY) ×2 IMPLANT
TROCAR XCEL BLUNT TIP 100MML (ENDOMECHANICALS) IMPLANT
TROCAR XCEL NON-BLD 11X100MML (ENDOMECHANICALS) ×2 IMPLANT
TROCAR XCEL NON-BLD 5MMX100MML (ENDOMECHANICALS) ×2 IMPLANT

## 2011-09-19 NOTE — H&P (View-Only) (Signed)
Subjective:     Patient ID: Heather [name], female   DOB: 01/29/1927, 78 y.o.   MRN: [id number]  HPI The patient is an 78 year old white female with a small ventral hernia containing only fat. She is scheduled for surgery to fix the hernia on June 21. We are planning a laparoscopic ventral hernia repair with mesh. She comes in today for a preop visit. She only has some occasional discomfort associated with the hernia. No signs of obstruction.  Review of Systems  Constitutional: Negative.   HENT: Negative.   Eyes: Negative.   Respiratory: Negative.   Cardiovascular: Negative.   Gastrointestinal: Positive for abdominal pain.  Genitourinary: Negative.   Musculoskeletal: Negative.   Skin: Negative.   Neurological: Negative.   Hematological: Negative.   Psychiatric/Behavioral: Negative.        Objective:   Physical Exam  Constitutional: She is oriented to person, place, and time. She appears well-developed and well-nourished.  HENT:  Head: Normocephalic and atraumatic.  Eyes: Conjunctivae and EOM are normal. Pupils are equal, round, and reactive to light.  Neck: Normal range of motion. Neck supple.  Cardiovascular: Normal rate, regular rhythm and normal heart sounds.   Pulmonary/Chest: Effort normal and breath sounds normal.  Abdominal: Soft. Bowel sounds are normal.       There is a small soft bulge just above the umbilicus that does not reduce. There is only some mild discomfort associated with it. No abdominal distention.  Musculoskeletal: Normal range of motion.  Neurological: She is alert and oriented to person, place, and time.  Skin: Skin is warm and dry.  Psychiatric: She has a normal mood and affect. Her behavior is normal.       Assessment:     There is a small ventral hernia. There is no sign of obstruction.    Plan:     The plan is for a laparoscopic ventral hernia repair with mesh. Have discussed with the patient in detail the risks and benefits of the operation  as well as some of the technical aspects and she understands and wishes to proceed. Surgery date is scheduled for June 21.

## 2011-09-19 NOTE — Progress Notes (Signed)
Received patient from PACU states she is thirsty and very tired. Settled in room, family members at bedside. IV infusing, left o2 on at 2l/[place].  No complaints of pain at this time.

## 2011-09-19 NOTE — Anesthesia Preprocedure Evaluation (Signed)
Anesthesia Evaluation  Patient identified by MRN, date of birth, ID band Patient awake    Reviewed: Allergy & Precautions, H&P , NPO status   Airway Mallampati: I TM Distance: >3 FB Neck ROM: full    Dental   Pulmonary shortness of breath, pneumonia , COPD         Cardiovascular hypertension, + dysrhythmias Atrial Fibrillation Rhythm:irregular Rate:Normal     Neuro/Psych  Neuromuscular disease    GI/Hepatic hiatal hernia, GERD-  ,  Endo/Other    Renal/GU      Musculoskeletal   Abdominal   Peds  Hematology   Anesthesia Other Findings   Reproductive/Obstetrics                           Anesthesia Physical Anesthesia Plan  ASA: III  Anesthesia Plan: General   Post-op Pain Management:    Induction: Intravenous  Airway Management Planned: Oral ETT  Additional Equipment:   Intra-op Plan:   Post-operative Plan: Extubation in OR  Informed Consent: I have reviewed the patients History and Physical, chart, labs and discussed the procedure including the risks, benefits and alternatives for the proposed anesthesia with the patient or authorized representative who has indicated his/her understanding and acceptance.     Plan Discussed with: CRNA, Anesthesiologist and Surgeon  Anesthesia Plan Comments:         Anesthesia Quick Evaluation

## 2011-09-19 NOTE — Transfer of Care (Signed)
Immediate Anesthesia Transfer of Care Note  Patient: Heather [name]  Procedure(s) Performed: Procedure(s) (LRB): LAPAROSCOPIC VENTRAL HERNIA (N/A) INSERTION OF MESH (N/A)  Patient Location: PACU  Anesthesia Type: General  Level of Consciousness: awake, oriented and patient cooperative  Airway & Oxygen Therapy: Patient Spontanous Breathing and Patient connected to nasal cannula oxygen  Post-op Assessment: Report given to PACU RN and Post -op Vital signs reviewed and stable  Post vital signs: Reviewed and stable  Complications: No apparent anesthesia complications

## 2011-09-19 NOTE — Anesthesia Postprocedure Evaluation (Signed)
  Anesthesia Post-op Note  Patient: Heather [name]  Procedure(s) Performed: Procedure(s) (LRB): LAPAROSCOPIC VENTRAL HERNIA (N/A) INSERTION OF MESH (N/A)  Patient Location: PACU  Anesthesia Type: General  Level of Consciousness: awake, oriented, sedated and patient cooperative  Airway and Oxygen Therapy: Patient Spontanous Breathing and Patient connected to nasal cannula oxygen  Post-op Pain: mild  Post-op Assessment: Post-op Vital signs reviewed, Patient's Cardiovascular Status Stable, Respiratory Function Stable, Patent Airway, No signs of Nausea or vomiting and Pain level controlled  Post-op Vital Signs: stable  Complications: No apparent anesthesia complications

## 2011-09-19 NOTE — Preoperative (Signed)
Beta Blockers   Reason not to administer Beta Blockers:Not Applicable 

## 2011-09-19 NOTE — Op Note (Signed)
09/19/2011  9:01 AM  PATIENT:  Heather [name]  78 y.o. female  PRE-OPERATIVE DIAGNOSIS:  ventral hernia  POST-OPERATIVE DIAGNOSIS:  ventral hernia  PROCEDURE:  Procedure(s) (LRB): LAPAROSCOPIC VENTRAL HERNIA (N/A) INSERTION OF MESH (N/A)  SURGEON:  Surgeon(s) and Role:    * Robyne Askew, MD - Primary  PHYSICIAN ASSISTANT:   ASSISTANTS: none   ANESTHESIA:   general  EBL:  Total I/O In: 800 [I.V.:800] Out: 300 [Urine:300]  BLOOD ADMINISTERED:none  DRAINS: none   LOCAL MEDICATIONS USED:  MARCAINE     SPECIMEN:  No Specimen  DISPOSITION OF SPECIMEN:  N/A  COUNTS:  YES  TOURNIQUET:  * No tourniquets in log *  DICTATION: .Dragon Dictation After informed consent was obtained the patient was brought to the operating room placed in the supine position on the operating room table. After adequate induction of general anesthesia the patient's abdomen was prepped with ChloraPrep, allowed to dry, and draped in usual sterile manner including the use of an ioban drape. A site was chosen in the right upper quadrant to access the abdominal cavity. This area was infiltrated with quarter percent Marcaine. A small stab incision was made with a 15 blade knife. A 5 mm Optiview port was then used to dissected bluntly to the layers of the abdominal wall under direct vision until access was gained to the abdominal cavity. The abdomen was then insufflated with carbon dioxide without difficulty. The camera was then placed through the 5 mm port and the abdomen was inspected. There appeared to be some omental adhesions to the upper abdominal wall and some fairly dense adhesions of colon and small bowel to the lower bowel wall. There was another free area in the right mid abdominal wall laterally where we placed a second 5 mm port under direct vision without difficulty. We then used the harmonic scalpel to take down the omental adhesions to the upper abdominal wall. We also took down the falciform  ligament sharply with the harmonic scalpel. Once this was accomplished we were able to identify the hernia. We were able to bluntly reduce the fat from the hernia sac. The actual hernia defect itself was very small. Because it was so small we decided to fix it likely would've an umbilical hernia. At this point we made a small incision overlying the fascial defect with a 15 blade knife. This incision was carried down through the skin and subcutaneous tissue sharply with the electrocautery until the fascial defect was encountered. The hernia defect was not really big enough to allow a finger through the opening. At this point we chose a large umbilical hernia patch system and placed it through the fascial defect into the abdominal cavity. We then pulled up on the mesh anchors and closed the fascial defect incorporating the mesh anchors with interrupted #1 Novafil stitches. We then used a secure strap tacker to tack the edges of the mesh so that there was good apposition to the abdominal wall. Once this was accomplished the mesh appeared to be in good position and the hernia appeared to be well repaired. The area was completely hemostatic. At this point the gas was allowed to escape and the ports were removed without difficulty. The subcutaneous tissue along the midline incision was then reapproximated with interrupted 3-0 Vicryl stitches. The skin incisions were all closed with interrupted 4-0 Monocryl subcuticular stitches. Dermabond dressings were applied. The patient tolerated the procedure well. At the end of the case on needle sponge and  instrument counts were correct. The patient was then awakened and taken to recovery in stable condition. PLAN OF CARE: Admit for overnight observation  PATIENT DISPOSITION:  PACU - hemodynamically stable.   Delay start of Pharmacological VTE agent (>24hrs) due to surgical blood loss or risk of bleeding: not applicable

## 2011-09-19 NOTE — Interval H&P Note (Signed)
History and Physical Interval Note:  09/19/2011 7:31 AM  Heather [name]  has presented today for surgery, with the diagnosis of ventral hernia  The various methods of treatment have been discussed with the patient and family. After consideration of risks, benefits and other options for treatment, the patient has consented to  Procedure(s) (LRB): LAPAROSCOPIC VENTRAL HERNIA (N/A) INSERTION OF MESH (N/A) as a surgical intervention .  The patient's history has been reviewed, patient examined, no change in status, stable for surgery.  I have reviewed the patients' chart and labs.  Questions were answered to the patient's satisfaction.     TOTH III,Amar Sippel S

## 2011-09-20 MED ORDER — PANTOPRAZOLE SODIUM 40 MG PO TBEC
40.0000 mg | DELAYED_RELEASE_TABLET | Freq: Every day | ORAL | Status: DC
  Administered 2011-09-20 – 2011-09-21 (×2): 40 mg via ORAL
  Filled 2011-09-20 (×2): qty 1

## 2011-09-20 NOTE — Progress Notes (Signed)
1 Day Post-Op  Subjective: Pt with some belching.  Unable to eat more than a few bites of food.    Objective: Vital signs in last 24 hours: Temp:  [97 F (36.1 C)-98.2 F (36.8 C)] 97.6 F (36.4 C) (06/22 0542) Pulse Rate:  [54-76] 59  (06/22 1006) Resp:  [18] 18  (06/22 0542) BP: (104-130)/(48-65) 111/53 mmHg (06/22 1006) SpO2:  [93 %-97 %] 96 % (06/22 0542) Weight:  [129 lb (58.514 kg)] 129 lb (58.514 kg) (06/21 1426) Last BM Date: 09/18/11  Intake/Output from previous day: 06/21 0701 - 06/22 0700 In: 2380 [P.O.:480; I.V.:1900] Out: 650 [Urine:650] Intake/Output this shift:    General appearance: alert, cooperative and no distress GI: soft, slightly distended, appropriately tender Incision/Wound: no erythema  Lab Results:  No results found for this basename: WBC:2,HGB:2,HCT:2,PLT:2 in the last 72 hours BMET No results found for this basename: NA:2,K:2,CL:2,CO2:2,GLUCOSE:2,BUN:2,CREATININE:2,CALCIUM:2 in the last 72 hours PT/INR  Basename 09/20/11 0500 09/19/11 0554  LABPROT 16.2* 15.7*  INR 1.27 1.22   ABG No results found for this basename: PHART:2,PCO2:2,PO2:2,HCO3:2 in the last 72 hours  Studies/Results: No results found.  Anti-infectives: Anti-infectives     Start     Dose/Rate Route Frequency Ordered Stop   09/18/11 1322   ceFAZolin (ANCEF) IVPB 2 g/50 mL premix        2 g 100 mL/hr over 30 Minutes Intravenous 60 min pre-op 09/18/11 1322 09/19/11 0745          Assessment/Plan: s/p Procedure(s) (LRB): LAPAROSCOPIC VENTRAL HERNIA (N/A) INSERTION OF MESH (N/A) Diet as tolerated Keep on IVFluids  Possible d/c tomorrow   LOS: 1 day    [facility] 09/20/2011

## 2011-09-21 MED ORDER — ENSURE PUDDING PO PUDG
1.0000 | Freq: Three times a day (TID) | ORAL | Status: DC
  Administered 2011-09-21 – 2011-09-22 (×2): 1 via ORAL

## 2011-09-21 NOTE — Progress Notes (Signed)
2 Days Post-Op  Subjective: +flatus. No BM. Still burping but less air out of mouth today & more out of bottom. Got really nauseous after breakfast (bacon, eggs)  Objective: Vital signs in last 24 hours: Temp:  [97.5 F (36.4 C)-97.9 F (36.6 C)] 97.5 F (36.4 C) (06/23 0553) Pulse Rate:  [48-56] 50  (06/23 0930) Resp:  [18-20] 20  (06/23 0553) BP: (114-129)/(62-71) 129/69 mmHg (06/23 0930) SpO2:  [91 %-97 %] 93 % (06/23 0621) Last BM Date: 09/18/11  Intake/Output from previous day: 06/22 0701 - 06/23 0700 In: 1746 [P.O.:590; I.V.:1156] Out: 600 [Urine:600] Intake/Output this shift:    Alert, sitting in chair cta Reg Soft, mild distension, tight. Incision c/d/i; +bs No edema  Lab Results:  No results found for this basename: WBC:2,HGB:2,HCT:2,PLT:2 in the last 72 hours BMET No results found for this basename: NA:2,K:2,CL:2,CO2:2,GLUCOSE:2,BUN:2,CREATININE:2,CALCIUM:2 in the last 72 hours PT/INR  Basename 09/21/11 0706 09/20/11 0500  LABPROT 20.0* 16.2*  INR 1.67* 1.27   ABG No results found for this basename: PHART:2,PCO2:2,PO2:2,HCO3:2 in the last 72 hours  Studies/Results: No results found.  Anti-infectives: Anti-infectives     Start     Dose/Rate Route Frequency Ordered Stop   09/18/11 1322   ceFAZolin (ANCEF) IVPB 2 g/50 mL premix        2 g 100 mL/hr over 30 Minutes Intravenous 60 min pre-op 09/18/11 1322 09/19/11 0745          Assessment/Plan: s/p Procedure(s) (LRB): LAPAROSCOPIC VENTRAL HERNIA (N/A) INSERTION OF MESH (N/A)  Will back down to fulls Boost Cont to have hold several of her BP meds due to BP and HR. May need to send pt home & 1/2 dosage for a few days - will defer to Dr Peggye Form. Andrey Campanile, MD, FACS General, Bariatric, & Minimally Invasive Surgery [facility], [place]   LOS: 2 days    Heather [name] 09/21/2011

## 2011-09-22 LAB — PROTIME-INR
INR: 1.76 — ABNORMAL HIGH (ref 0.00–1.49)
Prothrombin Time: 20.8 seconds — ABNORMAL HIGH (ref 11.6–15.2)

## 2011-09-22 MED ORDER — HYDROCODONE-ACETAMINOPHEN 5-325 MG PO TABS: 1.0000 | ORAL_TABLET | Freq: Four times a day (QID) | ORAL | Status: AC | PRN

## 2011-09-22 NOTE — Progress Notes (Signed)
3 Days Post-Op  Subjective: Feels better today. Wants to go home  Objective: Vital signs in last 24 hours: Temp:  [97.6 F (36.4 C)-97.9 F (36.6 C)] 97.8 F (36.6 C) (06/24 0540) Pulse Rate:  [50-65] 61  (06/24 0629) Resp:  [16-18] 16  (06/24 0540) BP: (129-164)/(69-77) 152/76 mmHg (06/24 0629) SpO2:  [93 %-95 %] 93 % (06/24 0540) Last BM Date: 09/18/11  Intake/Output from previous day: 06/23 0701 - 06/24 0700 In: 2281 [P.O.:1160; I.V.:1121] Out: -  Intake/Output this shift:    GI: soft and mildly tender. good bs. incisions ok  Lab Results:  No results found for this basename: WBC:2,HGB:2,HCT:2,PLT:2 in the last 72 hours BMET No results found for this basename: NA:2,K:2,CL:2,CO2:2,GLUCOSE:2,BUN:2,CREATININE:2,CALCIUM:2 in the last 72 hours PT/INR  Basename 09/21/11 0706 09/20/11 0500  LABPROT 20.0* 16.2*  INR 1.67* 1.27   ABG No results found for this basename: PHART:2,PCO2:2,PO2:2,HCO3:2 in the last 72 hours  Studies/Results: No results found.  Anti-infectives: Anti-infectives     Start     Dose/Rate Route Frequency Ordered Stop   09/18/11 1322   ceFAZolin (ANCEF) IVPB 2 g/50 mL premix        2 g 100 mL/hr over 30 Minutes Intravenous 60 min pre-op 09/18/11 1322 09/19/11 0745          Assessment/Plan: s/p Procedure(s) (LRB): LAPAROSCOPIC VENTRAL HERNIA (N/A) INSERTION OF MESH (N/A) Discharge  LOS: 3 days    TOTH III,Cniyah Sproull S 09/22/2011

## 2011-09-22 NOTE — Discharge Summary (Signed)
Physician Discharge Summary  Patient ID: Heather [name] MRN: [id number] DOB/AGE: 76/15/1928 76 y.o.  Admit date: 09/19/2011 Discharge date: 09/22/2011  Admission Diagnoses:  Discharge Diagnoses:  Active Problems:  * No active hospital problems. *    Discharged Condition: good  Hospital Course: the pt underwent lap vhr with mesh. Her postop course was complicated by pain and nausea so she stayed. By today her pain was better and she was able to tolerate her diet. She is ready for discharge home.  Consults: None  Significant Diagnostic Studies: none  Treatments: surgery: lap vhr with mesh  Discharge Exam: Blood pressure 152/76, pulse 61, temperature 97.8 F (36.6 C), temperature source Oral, resp. rate 16, height 5\' 5"  (1.651 m), weight 129 lb (58.514 kg), SpO2 93.00%. GI: soft with mild tenderness  Disposition:   Discharge Orders    Future Appointments: Provider: Department: Dept Phone: Center:   10/14/2011 10:30 AM Caleen Essex III, MD [facility] [phone] None     Medication List  As of 09/22/2011  8:03 AM   TAKE these medications         HYDROcodone-acetaminophen 5-325 MG per tablet   Commonly known as: NORCO   Take 1 tablet by mouth every 6 (six) hours as needed for pain.         ASK your doctor about these medications         calcium citrate-vitamin D 315-200 MG-UNIT per tablet   Commonly known as: CITRACAL+D   Take 1 tablet by mouth daily.      COZAAR 100 MG tablet   Generic drug: losartan   Take 100 mg by mouth daily.      diltiazem 180 MG 24 hr capsule   Commonly known as: CARDIZEM CD   Take 180 mg by mouth daily.      Ergocalciferol 2000 UNITS Tabs   Take 2,000 Units by mouth.      hydrochlorothiazide 25 MG tablet   Commonly known as: HYDRODIURIL   Take 25 mg by mouth daily.      levothyroxine 137 MCG tablet   Commonly known as: SYNTHROID, LEVOTHROID   Take 137 mcg by mouth daily.      multivitamin with minerals tablet   Take 1  tablet by mouth daily.      vitamin C 500 MG tablet   Commonly known as: ASCORBIC ACID   Take 500 mg by mouth daily.      warfarin 2.5 MG tablet   Commonly known as: COUMADIN   Take 2.5 mg by mouth daily. Take 1.5 tablets on Tuesdays and Thursdays, and take 1 tablet all other days.           Follow-up Information    Follow up with TOTH III,[name], MD in 3 weeks.   Contact information:   [employer] [facility] [address] [place] [address] [phone]          Signed: Robyne Askew 09/22/2011, 8:03 AM

## 2011-09-22 NOTE — Discharge Instructions (Signed)
Hernia A hernia occurs when an internal organ pushes out through a weak spot in the abdominal wall. Hernias most commonly occur in the groin and around the navel. Hernias often can be pushed back into place (reduced). Most hernias tend to get worse over time. Some abdominal hernias can get stuck in the opening (irreducible or incarcerated hernia) and cannot be reduced. An irreducible abdominal hernia which is tightly squeezed into the opening is at risk for impaired blood supply (strangulated hernia). A strangulated hernia is a medical emergency. Because of the risk for an irreducible or strangulated hernia, surgery may be recommended to repair a hernia. CAUSES   Heavy lifting.   Prolonged coughing.   Straining to have a bowel movement.   A cut (incision) made during an abdominal surgery.  HOME CARE INSTRUCTIONS   Bed rest is not required. You may continue your normal activities.   Avoid lifting more than 10 pounds (4.5 kg) or straining.   Cough gently. If you are a smoker it is best to stop. Even the best hernia repair can break down with the continual strain of coughing. Even if you do not have your hernia repaired, a cough will continue to aggravate the problem.   Do not wear anything tight over your hernia. Do not try to keep it in with an outside bandage or truss. These can damage abdominal contents if they are trapped within the hernia sac.   Eat a normal diet.   Avoid constipation. Straining over long periods of time will increase hernia size and encourage breakdown of repairs. If you cannot do this with diet alone, stool softeners may be used.  SEEK IMMEDIATE MEDICAL CARE IF:   You have a fever.   You develop increasing abdominal pain.   You feel nauseous or vomit.   Your hernia is stuck outside the abdomen, looks discolored, feels hard, or is tender.   You have any changes in your bowel habits or in the hernia that are unusual for you.   You have increased pain or  swelling around the hernia.   You cannot push the hernia back in place by applying gentle pressure while lying down.  MAKE SURE YOU:   Understand these instructions.   Will watch your condition.   Will get help right away if you are not doing well or get worse.  Document Released: 03/17/2005 Document Revised: 03/06/2011 Document Reviewed: 11/04/2007 [facility] Patient Information 2012 [place], [place].Hernia Repair Care After These instructions give you information on caring for yourself after your procedure. Your doctor may also give you more specific instructions. Call your doctor if you have any problems or questions after your procedure. HOME CARE   You may have changes in your poops (bowel movements).   You may have loose or watery poop (diarrhea).   You may be not able to poop.   Your bowels will slowly get back to normal.   Do not eat any food that makes you sick to your stomach (nauseous). Eat small meals 4 to 6 times a day instead of 3 large ones.   Do not drink pop. It will give you gas.   Do not drink alcohol.   Do not lift anything heavier than 10 pounds. This is about the weight of a gallon of milk.   Do not do anything that makes you very tired for at least 6 weeks.   Do not get your wound wet for 2 days.   You may take a sponge bath  during this time.   After 2 days you may take a shower. Gently pat your surgical cut (incision) dry with a towel. Do not rub it.   For men: You may have been given an athletic supporter (scrotal support) before you left the hospital. It holds your scrotum and testicles closer to your body so there is no strain on your wound. Wear the supporter until your doctor tells you that you do not need it anymore.  GET HELP RIGHT AWAY IF:  You have watery poop, or cannot poop for more than 3 days.   You feel sick to your stomach or throw up (vomit) more than 2 or 3 times.   You have temperature by mouth above 102 F (38.9 C).   You see  redness or puffiness (swelling) around your wound.   You see yellowish white fluid (pus) coming from your wound.   You see a bulge or bump in your lower belly (abdomen) or near your groin.   You develop a rash, trouble breathing, or any other symptoms from medicines taken.  MAKE SURE YOU:  Understand these instructions.   Will watch your condition.   Will get help right away if your are not doing well or get worse.  Document Released: 02/28/2008 Document Revised: 03/06/2011 Document Reviewed: 02/28/2008 [facility] Patient Information 2012 [place], [place].

## 2011-09-25 ENCOUNTER — Encounter (INDEPENDENT_AMBULATORY_CARE_PROVIDER_SITE_OTHER): Payer: Self-pay

## 2011-09-25 ENCOUNTER — Encounter (HOSPITAL_COMMUNITY): Payer: Self-pay | Admitting: General Surgery

## 2011-09-25 ENCOUNTER — Telehealth (INDEPENDENT_AMBULATORY_CARE_PROVIDER_SITE_OTHER): Payer: Self-pay | Admitting: General Surgery

## 2011-09-25 NOTE — Telephone Encounter (Signed)
Pt calling to report congestion and cough, requesting medication.  She had surgery for hernia repair 6 days ago and is doing well at home.  Advised her to contact her PCP for cough meds.  She understands and will comply.

## 2011-10-14 ENCOUNTER — Ambulatory Visit (INDEPENDENT_AMBULATORY_CARE_PROVIDER_SITE_OTHER): Payer: Medicare Other | Admitting: General Surgery

## 2011-10-14 ENCOUNTER — Encounter (INDEPENDENT_AMBULATORY_CARE_PROVIDER_SITE_OTHER): Payer: Self-pay | Admitting: General Surgery

## 2011-10-14 VITALS — BP 144/74 | HR 70 | Temp 97.3°F | Resp 16 | Ht 65.0 in | Wt 131.1 lb

## 2011-10-14 DIAGNOSIS — K439 Ventral hernia without obstruction or gangrene: Secondary | ICD-10-CM

## 2011-10-14 NOTE — Patient Instructions (Signed)
No heavy lifting 

## 2011-10-27 NOTE — Progress Notes (Signed)
Subjective:     Patient ID: Heather [name], female   DOB: 1926-12-12, 78 y.o.   MRN: [id number]  HPI The patient is an 78 year old white female who is about 3 weeks out from a laparoscopic ventral hernia repair with mesh. She tolerated the surgery well. She has only some minor soreness. Her appetite is good and her bowels are working normally. She has no complaints today.  Review of Systems     Objective:   Physical Exam On exam her abdomen is soft and nontender. Her incisions are all healing nicely. Her abdominal wall feels solid with no palpable evidence for recurrence of her hernia.    Assessment:     3 weeks status post laparoscopic ventral hernia repair with mesh    Plan:     At this point I would like her to continue to refrain from any heavy lifting. We will plan to see her back in another month to check her progress.

## 2011-11-20 ENCOUNTER — Ambulatory Visit (INDEPENDENT_AMBULATORY_CARE_PROVIDER_SITE_OTHER): Payer: Medicare Other | Admitting: General Surgery

## 2011-11-20 ENCOUNTER — Encounter (INDEPENDENT_AMBULATORY_CARE_PROVIDER_SITE_OTHER): Payer: Self-pay | Admitting: General Surgery

## 2011-11-20 VITALS — BP 130/68 | HR 72 | Temp 97.8°F | Resp 20 | Ht 63.0 in | Wt 134.0 lb

## 2011-11-20 DIAGNOSIS — K439 Ventral hernia without obstruction or gangrene: Secondary | ICD-10-CM

## 2011-11-20 NOTE — Patient Instructions (Signed)
You may return to all normal activities with no restrictions

## 2011-11-20 NOTE — Progress Notes (Signed)
Subjective:     Patient ID: Heather [name], female   DOB: 05-22-1926, 78 y.o.   MRN: [id number]  HPI The patient is an 78 year old white female who is about 2 months out from a ventral hernia repair with mesh. She has done very well and has no complaints today. She denies any pain. Her appetite is good and her bowels are working normally.  Review of Systems     Objective:   Physical Exam On exam her abdomen is soft and nontender. Her incisions have all healed nicely. Her abdominal wall feels solid with no palpable evidence for recurrence of the hernia.    Assessment:     2 months status post ventral hernia repair with mesh    Plan:     At this point she is doing very well. She is far enough out that she can return to her normal activities without any restrictions. We will plan to see her back on a when necessary basis.

## 2012-03-09 ENCOUNTER — Other Ambulatory Visit: Payer: Self-pay | Admitting: Dermatology

## 2012-05-24 HISTORY — PX: OTHER SURGICAL HISTORY: SHX169

## 2012-05-26 ENCOUNTER — Other Ambulatory Visit: Payer: Self-pay | Admitting: Internal Medicine

## 2012-05-26 ENCOUNTER — Other Ambulatory Visit: Payer: Self-pay

## 2012-05-26 DIAGNOSIS — Z1231 Encounter for screening mammogram for malignant neoplasm of breast: Secondary | ICD-10-CM

## 2012-06-14 ENCOUNTER — Ambulatory Visit: Payer: Self-pay | Admitting: Internal Medicine

## 2012-06-14 DIAGNOSIS — Z7901 Long term (current) use of anticoagulants: Secondary | ICD-10-CM | POA: Insufficient documentation

## 2012-06-14 DIAGNOSIS — I4891 Unspecified atrial fibrillation: Secondary | ICD-10-CM | POA: Insufficient documentation

## 2012-06-14 DIAGNOSIS — I480 Paroxysmal atrial fibrillation: Secondary | ICD-10-CM | POA: Insufficient documentation

## 2012-06-29 ENCOUNTER — Ambulatory Visit: Payer: Medicare Other

## 2012-07-07 ENCOUNTER — Ambulatory Visit: Payer: Medicare Other

## 2012-09-06 ENCOUNTER — Ambulatory Visit (INDEPENDENT_AMBULATORY_CARE_PROVIDER_SITE_OTHER): Payer: Medicare Other | Admitting: Pharmacist Clinician (PhC)/ Clinical Pharmacy Specialist

## 2012-09-06 VITALS — BP 142/78 | HR 68

## 2012-09-06 DIAGNOSIS — Z7901 Long term (current) use of anticoagulants: Secondary | ICD-10-CM

## 2012-09-06 DIAGNOSIS — I4891 Unspecified atrial fibrillation: Secondary | ICD-10-CM

## 2012-09-20 ENCOUNTER — Ambulatory Visit (INDEPENDENT_AMBULATORY_CARE_PROVIDER_SITE_OTHER): Payer: Medicare Other | Admitting: Pharmacist Clinician (PhC)/ Clinical Pharmacy Specialist

## 2012-09-20 VITALS — BP 144/82 | HR 68

## 2012-09-20 DIAGNOSIS — Z7901 Long term (current) use of anticoagulants: Secondary | ICD-10-CM

## 2012-09-20 DIAGNOSIS — I4891 Unspecified atrial fibrillation: Secondary | ICD-10-CM

## 2012-09-20 LAB — POCT INR: INR: 2.4

## 2012-10-18 ENCOUNTER — Ambulatory Visit (INDEPENDENT_AMBULATORY_CARE_PROVIDER_SITE_OTHER): Payer: Medicare Other | Admitting: Pharmacist Clinician (PhC)/ Clinical Pharmacy Specialist

## 2012-10-18 VITALS — BP 130/80 | HR 88

## 2012-10-18 DIAGNOSIS — I4891 Unspecified atrial fibrillation: Secondary | ICD-10-CM

## 2012-10-18 DIAGNOSIS — Z7901 Long term (current) use of anticoagulants: Secondary | ICD-10-CM

## 2012-10-18 LAB — POCT INR: INR: 2.4

## 2012-11-13 ENCOUNTER — Encounter (HOSPITAL_COMMUNITY): Payer: Self-pay | Admitting: Emergency Medicine

## 2012-11-13 ENCOUNTER — Emergency Department (HOSPITAL_COMMUNITY)
Admission: EM | Admit: 2012-11-13 | Discharge: 2012-11-14 | Disposition: A | Discharge status: Home / Self Care | Payer: Medicare Other | Attending: Emergency Medicine | Admitting: Emergency Medicine

## 2012-11-13 ENCOUNTER — Emergency Department (HOSPITAL_COMMUNITY): Payer: Medicare Other

## 2012-11-13 DIAGNOSIS — Z8701 Personal history of pneumonia (recurrent): Secondary | ICD-10-CM | POA: Insufficient documentation

## 2012-11-13 DIAGNOSIS — Z8719 Personal history of other diseases of the digestive system: Secondary | ICD-10-CM | POA: Insufficient documentation

## 2012-11-13 DIAGNOSIS — Z87891 Personal history of nicotine dependence: Secondary | ICD-10-CM | POA: Insufficient documentation

## 2012-11-13 DIAGNOSIS — Z79899 Other long term (current) drug therapy: Secondary | ICD-10-CM | POA: Insufficient documentation

## 2012-11-13 DIAGNOSIS — I10 Essential (primary) hypertension: Secondary | ICD-10-CM | POA: Insufficient documentation

## 2012-11-13 DIAGNOSIS — Z8639 Personal history of other endocrine, nutritional and metabolic disease: Secondary | ICD-10-CM | POA: Insufficient documentation

## 2012-11-13 DIAGNOSIS — R5381 Other malaise: Secondary | ICD-10-CM | POA: Insufficient documentation

## 2012-11-13 DIAGNOSIS — E049 Nontoxic goiter, unspecified: Secondary | ICD-10-CM | POA: Insufficient documentation

## 2012-11-13 DIAGNOSIS — J449 Chronic obstructive pulmonary disease, unspecified: Secondary | ICD-10-CM | POA: Insufficient documentation

## 2012-11-13 DIAGNOSIS — E039 Hypothyroidism, unspecified: Secondary | ICD-10-CM | POA: Insufficient documentation

## 2012-11-13 DIAGNOSIS — R42 Dizziness and giddiness: Secondary | ICD-10-CM | POA: Insufficient documentation

## 2012-11-13 DIAGNOSIS — Z8679 Personal history of other diseases of the circulatory system: Secondary | ICD-10-CM | POA: Insufficient documentation

## 2012-11-13 DIAGNOSIS — Z862 Personal history of diseases of the blood and blood-forming organs and certain disorders involving the immune mechanism: Secondary | ICD-10-CM | POA: Insufficient documentation

## 2012-11-13 DIAGNOSIS — Z8739 Personal history of other diseases of the musculoskeletal system and connective tissue: Secondary | ICD-10-CM | POA: Insufficient documentation

## 2012-11-13 DIAGNOSIS — I4891 Unspecified atrial fibrillation: Secondary | ICD-10-CM

## 2012-11-13 DIAGNOSIS — Z8669 Personal history of other diseases of the nervous system and sense organs: Secondary | ICD-10-CM | POA: Insufficient documentation

## 2012-11-13 DIAGNOSIS — Z7902 Long term (current) use of antithrombotics/antiplatelets: Secondary | ICD-10-CM | POA: Insufficient documentation

## 2012-11-13 DIAGNOSIS — R5383 Other fatigue: Secondary | ICD-10-CM | POA: Insufficient documentation

## 2012-11-13 DIAGNOSIS — J4489 Other specified chronic obstructive pulmonary disease: Secondary | ICD-10-CM | POA: Insufficient documentation

## 2012-11-13 LAB — CBC WITH DIFFERENTIAL/PLATELET
Basophils Absolute: 0 10*3/uL (ref 0.0–0.1)
Basophils Relative: 0 % (ref 0–1)
Eosinophils Absolute: 0.2 10*3/uL (ref 0.0–0.7)
Eosinophils Relative: 2 % (ref 0–5)
HCT: 42.8 % (ref 36.0–46.0)
Hemoglobin: 15.7 g/dL — ABNORMAL HIGH (ref 12.0–15.0)
Lymphocytes Relative: 35 % (ref 12–46)
Lymphs Abs: 2.6 K/uL (ref 0.7–4.0)
MCH: 34.1 pg — ABNORMAL HIGH (ref 26.0–34.0)
MCHC: 36.7 g/dL — ABNORMAL HIGH (ref 30.0–36.0)
MCV: 93 fL (ref 78.0–100.0)
Monocytes Absolute: 0.7 K/uL (ref 0.1–1.0)
Monocytes Relative: 9 % (ref 3–12)
Neutro Abs: 4 K/uL (ref 1.7–7.7)
Neutrophils Relative %: 54 % (ref 43–77)
Platelets: 287 10*3/uL (ref 150–400)
RBC: 4.6 MIL/uL (ref 3.87–5.11)
RDW: 13.8 % (ref 11.5–15.5)
WBC: 7.4 K/uL (ref 4.0–10.5)

## 2012-11-13 LAB — BASIC METABOLIC PANEL WITH GFR
CO2: 27 meq/L (ref 19–32)
Calcium: 9.4 mg/dL (ref 8.4–10.5)
Chloride: 92 meq/L — ABNORMAL LOW (ref 96–112)
Creatinine, Ser: 0.55 mg/dL (ref 0.50–1.10)
Glucose, Bld: 111 mg/dL — ABNORMAL HIGH (ref 70–99)

## 2012-11-13 LAB — BASIC METABOLIC PANEL
BUN: 15 mg/dL (ref 6–23)
GFR calc Af Amer: >90 mL/min (ref >90)
GFR calc non Af Amer: 84 mL/min — ABNORMAL LOW (ref >90)
Potassium: 3.4 mEq/L — ABNORMAL LOW (ref 3.5–5.1)
Sodium: 132 mEq/L — ABNORMAL LOW (ref 135–145)

## 2012-11-13 LAB — TROPONIN I: Troponin I: <0.3 ng/mL (ref <0.30)

## 2012-11-13 LAB — PROTIME-INR
INR: 2.77 — ABNORMAL HIGH (ref 0.00–1.49)
Prothrombin Time: 28.3 s — ABNORMAL HIGH (ref 11.6–15.2)

## 2012-11-13 MED ORDER — DILTIAZEM HCL ER COATED BEADS 240 MG PO CP24: 240.0000 mg | ORAL_CAPSULE | Freq: Every day | ORAL | Status: DC

## 2012-11-13 NOTE — Discharge Instructions (Signed)
Atrial Fibrillation Your caregiver has diagnosed you with atrial fibrillation (AFib). The heart normally beats very regularly; AFib is a type of irregular heartbeat. The heart rate may be faster or slower than normal. This can prevent your heart from pumping as well as it should. AFib can be constant (chronic) or intermittent (paroxysmal). CAUSES  Atrial fibrillation may be caused by:  Heart disease, including heart attack, coronary artery disease, heart failure, diseases of the heart valves, and others.  Blood clot in the lungs (pulmonary embolism).  Pneumonia or other infections.  Chronic lung disease.  Thyroid disease.  Toxins. These include alcohol, some medications (such as decongestant medications or diet pills), and caffeine. In some people, no cause for AFib can be found. This is referred to as Lone Atrial Fibrillation. SYMPTOMS   Palpitations or a fluttering in your chest.  A vague sense of chest discomfort.  Shortness of breath.  Sudden onset of lightheadedness or weakness. Sometimes, the first sign of AFib can be a complication of the condition. This could be a stroke or heart failure. DIAGNOSIS  Your description of your condition may make your caregiver suspicious of atrial fibrillation. Your caregiver will examine your pulse to determine if fibrillation is present. An EKG (electrocardiogram) will confirm the diagnosis. Further testing may help determine what caused you to have atrial fibrillation. This may include chest x-ray, echocardiogram, blood tests, or CT scans. PREVENTION  If you have previously had atrial fibrillation, your caregiver may advise you to avoid substances known to cause the condition (such as stimulant medications, and possibly caffeine or alcohol). You may be advised to use medications to prevent recurrence. Proper treatment of any underlying condition is important to help prevent recurrence. PROGNOSIS  Atrial fibrillation does tend to become a  chronic condition over time. It can cause significant complications (see below). Atrial fibrillation is not usually immediately life-threatening, but it can shorten your life expectancy. This seems to be worse in women. If you have lone atrial fibrillation and are under 60 years old, the risk of complications is very low, and life expectancy is not shortened. RISKS AND COMPLICATIONS  Complications of atrial fibrillation can include stroke, chest pain, and heart failure. Your caregiver will recommend treatments for the atrial fibrillation, as well as for any underlying conditions, to help minimize risk of complications. TREATMENT  Treatment for AFib is divided into several categories:  Treatment of any underlying condition.  Converting you out of AFib into a regular (sinus) rhythm.  Controlling rapid heart rate.  Prevention of blood clots and stroke. Medications and procedures are available to convert your atrial fibrillation to sinus rhythm. However, recent studies have shown that this may not offer you any advantage, and cardiac experts are continuing research and debate on this topic. More important is controlling your rapid heartbeat. The rapid heartbeat causes more symptoms, and places strain on your heart. Your caregiver will advise you on the use of medications that can control your heart rate. Atrial fibrillation is a strong stroke risk. You can lessen this risk by taking blood thinning medications such as Coumadin (warfarin), or sometimes aspirin. These medications need close monitoring by your caregiver. Over-medication can cause bleeding. Too little medication may not protect against stroke. HOME CARE INSTRUCTIONS   If your caregiver prescribed medicine to make your heartbeat more normally, take as directed.  If blood thinners were prescribed by your caregiver, take EXACTLY as directed.  Perform blood tests EXACTLY as directed.  Quit smoking. Smoking increases your cardiac and   lung  (pulmonary) risks.  DO NOT drink alcohol.  DO NOT drink caffeinated drinks (e.g. coffee, soda, chocolate, and leaf teas). You may drink decaffeinated coffee, soda or tea.  If you are overweight, you should choose a reduced calorie diet to lose weight. Please see a registered dietitian if you need more information about healthy weight loss. DO NOT USE DIET PILLS as they may aggravate heart problems.  If you have other heart problems that are causing AFib, you may need to eat a low salt, fat, and cholesterol diet. Your caregiver will tell you if this is necessary.  Exercise every day to improve your physical fitness. Stay active unless advised otherwise.  If your caregiver has given you a follow-up appointment, it is very important to keep that appointment. Not keeping the appointment could result in heart failure or stroke. If there is any problem keeping the appointment, you must call back to this facility for assistance. SEEK MEDICAL CARE IF:  You notice a change in the rate, rhythm or strength of your heartbeat.  You develop an infection or any other change in your overall health status. SEEK IMMEDIATE MEDICAL CARE IF:   You develop chest pain, abdominal pain, sweating, weakness or feel sick to your stomach (nausea).  You develop shortness of breath.  You develop swollen feet and ankles.  You develop dizziness, numbness, or weakness of your face or limbs, or any change in vision or speech. MAKE SURE YOU:   Understand these instructions.  Will watch your condition.  Will get help right away if you are not doing well or get worse. Document Released: 03/17/2005 Document Revised: 06/09/2011 Document Reviewed: 10/24/2009 Exit[employer] Patient Information 2014 [employer].  

## 2012-11-13 NOTE — ED Notes (Signed)
Pt states she wasn't feeling well today. She states she felt weak and dizzy, and very nauseous earlier. Pt denies any pain, but states she still feels sick to her stomach.

## 2012-11-13 NOTE — ED Notes (Signed)
Pt c/o dizziness, Nausea, and sob upon EMS arrival pt hr was between 180-190. Pt was given 10mg  Cardizem and rate has been controlled around 100. Pt states she does not feel well but no longer feels dizzy, nauseous and sob. 20g LFA.

## 2012-11-14 NOTE — ED Provider Notes (Signed)
CSN: [id number]     Arrival date & time 11/13/12  1956 History     First MD Initiated Contact with Patient 11/13/12 2007     Chief Complaint  Patient presents with  . Atrial Fibrillation   (Consider location/radiation/quality/duration/timing/severity/associated sxs/prior Treatment) HPI Comments: Pt was at home, watching television, had onset of weakness, feeling faint.  Got up and rested in bed, got better for a while, symptoms kept reoccurring.  Worse when she stood up to try to walk.  Family happened to stop by around 6 PM and brought dinner.  Pt was visibly not feeling well, kept putting head down.  No CP, sweats, nausea.  Some SOB worse with exertion.  Pt with h/o atrial fib controlled on diltiazem and coumadin.  EMS was called and they told family she was in atrial fib with RVR.  Pt was given diltiazem bolus via IV en route.  Since arrival, pt has felt much improved although still a little weak.  No recent illness such as fevers, sore throat, cough, diarrhea.  She is going through a move currently.  She is followed by [facility].    Patient is a 79 y.o. female presenting with atrial fibrillation. The history is provided by the patient, a relative and medical records.  Atrial Fibrillation Associated symptoms include shortness of breath. Pertinent negatives include no chest pain and no abdominal pain.    Past Medical History  Diagnosis Date  . Atrial fibrillation   . 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 COPD (chronic obstructive pulmonary disease)   . Essential hypertension, benign   . Unspecified hypothyroidism   . Gallstones   . Peripheral neuropathy   . Venous insufficiency   . Osteoporosis   . Pneumonia     states she had it twice, last time a couple of months ago  . Shortness of breath     with exertion  . GERD (gastroesophageal reflux disease)   . H/O hiatal hernia   . Arthritis    Past Surgical History  Procedure Laterality Date  . Total  abdominal hysterectomy    . Tonsillectomy    . Left hip relacement  2000  . Back surgery      x2  . Colon surgery      partial  . Appendectomy  1935  . Cataract extraction w/ intraocular lens  implant, bilateral    . Ventral hernia repair  09/19/2011    Procedure: LAPAROSCOPIC VENTRAL HERNIA;  Surgeon: Robyne Askew, MD;  Location: [facility] OR;  Service: General;  Laterality: N/A;  laparoscopic ventral hernia repair with mesh   Family History  Problem Relation Age of Onset  . Stroke Mother   . Stroke Father   . Heart block Brother   . Cancer Brother   . Diabetes Brother    History  Substance Use Topics  . Smoking status: Former Smoker -- 1.00 packs/day for 13 years    Types: Cigarettes    Quit date: 09/11/1988  . Smokeless tobacco: Never Used     Comment: quit 1990  . Alcohol Use: No   OB History   Grav Para Term Preterm Abortions TAB SAB Ect Mult Living                 Review of Systems  Constitutional: Positive for fatigue. Negative for diaphoresis.  HENT: Negative for congestion.   Respiratory: Positive for shortness of breath. Negative for cough and chest tightness.   Cardiovascular: Negative for chest  pain and palpitations.  Gastrointestinal: Negative for nausea and abdominal pain.  Neurological: Positive for light-headedness. Negative for syncope.  All other systems reviewed and are negative.    Allergies  Review of patient's allergies indicates no known allergies.  Home Medications   Current Outpatient Rx  Name  Route  Sig  Dispense  Refill  . calcium citrate-vitamin D (CITRACAL+D) 315-200 MG-UNIT per tablet   Oral   Take 1 tablet by mouth daily.         . hydrochlorothiazide (HYDRODIURIL) 25 MG tablet   Oral   Take 25 mg by mouth daily.         Marland Kitchen levothyroxine (SYNTHROID, LEVOTHROID) 137 MCG tablet   Oral   Take 68.5-137 mcg by mouth daily. Take half tablet on Monday, Wednesday, and Friday. Take 1 tablet all other days         . losartan  (COZAAR) 100 MG tablet   Oral   Take 100 mg by mouth daily.         . Multiple Vitamins-Minerals (MULTIVITAMIN WITH MINERALS) tablet   Oral   Take 1 tablet by mouth daily.         Marland Kitchen warfarin (COUMADIN) 2.5 MG tablet   Oral   Take 2.5-3.75 mg by mouth daily. Take 1.5 tablets on Monday and 1 tablet all other days         . diltiazem (CARDIZEM CD) 240 MG 24 hr capsule   Oral   Take 1 capsule (240 mg total) by mouth daily.   30 capsule   0    BP 129/73  Pulse 90  Temp(Src) 98 F (36.7 C)  Resp 11  SpO2 93% Physical Exam  Nursing note and vitals reviewed. Constitutional: She is oriented to person, place, and time. She appears well-developed and well-nourished. No distress.  HENT:  Head: Normocephalic and atraumatic.  Mouth/Throat: Oropharynx is clear and moist.  Eyes: Conjunctivae and EOM are normal.  Neck: Neck supple. No JVD present. Thyromegaly present.  Cardiovascular: Normal rate, regular rhythm and intact distal pulses.   No murmur heard. Pulmonary/Chest: Effort normal. No respiratory distress. She has no wheezes. She has no rales.  Abdominal: Soft. She exhibits no distension. There is no tenderness. There is no rebound and no guarding.  Musculoskeletal: She exhibits no edema.  Neurological: She is alert and oriented to person, place, and time. Coordination normal.  Skin: Skin is warm. No rash noted. She is not diaphoretic.  Psychiatric: She has a normal mood and affect.    ED Course   Procedures (including critical care time)  Labs Reviewed  CBC WITH DIFFERENTIAL - Abnormal; Notable for the following:    Hemoglobin 15.7 (*)    MCH 34.1 (*)    MCHC 36.7 (*)    All other components within normal limits  BASIC METABOLIC PANEL - Abnormal; Notable for the following:    Sodium 132 (*)    Potassium 3.4 (*)    Chloride 92 (*)    Glucose, Bld 111 (*)    GFR calc non Af Amer 84 (*)    All other components within normal limits  PROTIME-INR - Abnormal; Notable  for the following:    Prothrombin Time 28.3 (*)    INR 2.77 (*)    All other components within normal limits  TROPONIN I   Dg Chest 2 View  11/13/2012   *RADIOLOGY REPORT*  Clinical Data: 79 year old female shortness of breath dizziness weakness on standing.  Atrial fibrillation.  CHEST - 2 VIEW  Comparison: 08/06/2007 and earlier. CT abdomen and pelvis 07/08/2011.  Findings: Semi upright AP and lateral views of the chest.  Focal eventration or Bochdalek hernia at the right hemidiaphragm, the 07/08/2011 comparison suggests the former.  Large lung volumes. Attenuated markings in the upper lobes suggests emphysema.  No pneumothorax, pulmonary edema, pleural effusion or consolidation. Osteopenia. No acute osseous abnormality identified.   Cardiac size and mediastinal contours are within normal limits.  IMPRESSION: Hyperinflation. No acute cardiopulmonary abnormality. Right side posterior diaphragm eventration incidentally noted.   Original Report Authenticated By: Erskine Speed, M.D.   1. Atrial fibrillation    ECG showed SR at rate 91, poor r wave progression, normal axis, non specific T wave abn's.  Borderline ECG.   RA sat is 93% and I interpret to be adequate MDM  Pt with what sounds like atrial fibrillation with RVR causing lightheadedness and near syncope without CP or anginal equivalent.  No h/o CAD in the past. Is on coumadin.  Pt has been in sinus rhythm for 4 hours in the ED, K+ is only minimally low at 3.4.  Spoke to Dr. Verdie Mosher with cardiology, seems ok to be discharged to home with close outpt follow up.  Decided to go up from 180 to 240 of diltiazem for better control.  Pt understands return precautions.       Gavin Pound. Marionna Gonia, MD 11/14/12 1901

## 2012-11-15 ENCOUNTER — Ambulatory Visit (INDEPENDENT_AMBULATORY_CARE_PROVIDER_SITE_OTHER): Payer: Medicare Other | Admitting: Cardiology

## 2012-11-15 ENCOUNTER — Ambulatory Visit (INDEPENDENT_AMBULATORY_CARE_PROVIDER_SITE_OTHER): Payer: Medicare Other | Admitting: Pharmacist Clinician (PhC)/ Clinical Pharmacy Specialist

## 2012-11-15 ENCOUNTER — Ambulatory Visit: Payer: Medicare Other | Admitting: Pharmacist Clinician (PhC)/ Clinical Pharmacy Specialist

## 2012-11-15 ENCOUNTER — Encounter: Payer: Self-pay | Admitting: Cardiology

## 2012-11-15 VITALS — BP 112/70 | HR 72

## 2012-11-15 VITALS — BP 108/58 | HR 79 | Ht 65.0 in | Wt 126.3 lb

## 2012-11-15 DIAGNOSIS — Z7901 Long term (current) use of anticoagulants: Secondary | ICD-10-CM

## 2012-11-15 DIAGNOSIS — I4891 Unspecified atrial fibrillation: Secondary | ICD-10-CM

## 2012-11-15 DIAGNOSIS — I10 Essential (primary) hypertension: Secondary | ICD-10-CM

## 2012-11-15 DIAGNOSIS — J439 Emphysema, unspecified: Secondary | ICD-10-CM | POA: Insufficient documentation

## 2012-11-15 DIAGNOSIS — J438 Other emphysema: Secondary | ICD-10-CM

## 2012-11-15 MED ORDER — METOPROLOL TARTRATE 25 MG PO TABS: 12.5000 mg | ORAL_TABLET | Freq: Two times a day (BID) | ORAL | Status: DC

## 2012-11-15 NOTE — Assessment & Plan Note (Signed)
She recently had an episode and was seen at [facility] ER 11/13/12. NSR today.

## 2012-11-15 NOTE — Assessment & Plan Note (Signed)
Stable

## 2012-11-15 NOTE — Assessment & Plan Note (Signed)
No history of any asthmatic or RAD component.

## 2012-11-15 NOTE — Patient Instructions (Addendum)
Continue Diltiazem 180 mg daily. Start Metoprolol 12.5 mg daily. Try and eat one banana a day. See Dr Rennis Golden in 2 weeks.

## 2012-11-15 NOTE — Progress Notes (Signed)
11/15/2012 Heather [name]   December 28, 1926  [id number]  Primary Physicia ARONSON,RICHARD A, MD Primary Cardiologist: Dr Rennis Golden  HPI:  Pleasant 79 y/o followed by Dr Rennis Golden with a history of PAF. She recently had a MET test that indicated Emphysema although she has no history of smoking. She is on chronic Coumadin. Her B/P has been controlled by her readings at home and our readings here by our pharmacist.  Echo in Feb 2013 showed an EF of >55%, NL LA size, and NL RVF.        She had an episode of weakness and palpitations 11/13/12 pm. EMS was contacted and she was taken to [facility]. She received Diltiazem en route and was in NSR when she arrived at [facility]. They suggested she increase her Diltiazem to 240 mg daily but she was hesitant to do this. She is seen now in the office for follow up. She still feels "weak"- no further palpitations.   Current Outpatient Prescriptions  Medication Sig Dispense Refill  . calcium citrate-vitamin D (CITRACAL+D) 315-200 MG-UNIT per tablet Take 1 tablet by mouth daily.      Marland Kitchen diltiazem (CARDIZEM CD) 180 MG 24 hr capsule Take 180 mg by mouth daily.      . hydrochlorothiazide (HYDRODIURIL) 25 MG tablet Take 25 mg by mouth daily.      Marland Kitchen levothyroxine (SYNTHROID, LEVOTHROID) 137 MCG tablet Take 68.5-137 mcg by mouth daily. Take half tablet on Monday, Wednesday, and Friday. Take 1 tablet all other days      . losartan (COZAAR) 100 MG tablet Take 100 mg by mouth daily.      . Multiple Vitamins-Minerals (MULTIVITAMIN WITH MINERALS) tablet Take 1 tablet by mouth daily.      Marland Kitchen warfarin (COUMADIN) 2.5 MG tablet Take 2.5-3.75 mg by mouth daily. Take 1.5 tablets on Monday and 1 tablet all other days      . metoprolol tartrate (LOPRESSOR) 25 MG tablet Take 0.5 tablets (12.5 mg total) by mouth 2 (two) times daily.  30 tablet  5   No current facility-administered medications for this visit.    No Known Allergies  History   Social History  . Marital Status: Married    Spouse Name:  N/A    Number of Children: N/A  . Years of Education: N/A   Occupational History  . Not on file.   Social History Main Topics  . Smoking status: Former Smoker -- 1.00 packs/day for 13 years    Types: Cigarettes    Quit date: 09/11/1988  . Smokeless tobacco: Never Used     Comment: quit 1990  . Alcohol Use: No  . Drug Use: No  . Sexual Activity: Yes   Other Topics Concern  . Not on file   Social History Narrative  . No narrative on file     Review of Systems: General: negative for chills, fever, night sweats or weight changes.  Cardiovascular: negative for chest pain, dyspnea on exertion, edema, orthopnea, palpitations, paroxysmal nocturnal dyspnea or shortness of breath Dermatological: negative for rash Respiratory: negative for cough or wheezing Urologic: negative for hematuria Abdominal: negative for nausea, vomiting, diarrhea, bright red blood per rectum, melena, or hematemesis Neurologic: negative for visual changes, syncope, or dizziness All other systems reviewed and are otherwise negative except as noted above.    Blood pressure 108/58, pulse 79, height 5\' 5"  (1.651 m), weight 126 lb 4.8 oz (57.289 kg).  General appearance: alert, cooperative and no distress Lungs: clear to auscultation bilaterally Heart:  regular rate and rhythm  EKG  normal sinus rhythm, unchanged from previous tracings. PVCs  ASSESSMENT AND PLAN:   PAF (paroxysmal atrial fibrillation) She recently had an episode and was seen at [facility] ER 11/13/12. NSR today.  Emphysema No history of any asthmatic or RAD component.  HTN (hypertension) Stable.   PLAN  She has no history of asthma or RAD. I added low dose Metoprolol instead of going ahead with increased Diltiazem (she never filled the Rx for 240 mg). She will follow up with Dr Rennis Golden in two weeks. Her B/P is on the low side today but she says it is usually 120s  systolic at home. I also suggested she eat a banana a day (her K+ was  3.4)  Benigno Check KPA-C 11/15/2012 4:50 PM

## 2012-12-01 ENCOUNTER — Encounter: Payer: Self-pay | Admitting: *Deleted

## 2012-12-02 ENCOUNTER — Ambulatory Visit (INDEPENDENT_AMBULATORY_CARE_PROVIDER_SITE_OTHER): Payer: Medicare Other | Admitting: Internal Medicine

## 2012-12-02 ENCOUNTER — Ambulatory Visit: Payer: Medicare Other | Admitting: Pharmacist Clinician (PhC)/ Clinical Pharmacy Specialist

## 2012-12-02 ENCOUNTER — Encounter: Payer: Self-pay | Admitting: Internal Medicine

## 2012-12-02 ENCOUNTER — Ambulatory Visit (INDEPENDENT_AMBULATORY_CARE_PROVIDER_SITE_OTHER): Payer: Medicare Other | Admitting: Pharmacist Clinician (PhC)/ Clinical Pharmacy Specialist

## 2012-12-02 VITALS — BP 108/72 | HR 64 | Ht 65.0 in | Wt 126.7 lb

## 2012-12-02 DIAGNOSIS — I480 Paroxysmal atrial fibrillation: Secondary | ICD-10-CM

## 2012-12-02 DIAGNOSIS — Z7901 Long term (current) use of anticoagulants: Secondary | ICD-10-CM

## 2012-12-02 DIAGNOSIS — I10 Essential (primary) hypertension: Secondary | ICD-10-CM

## 2012-12-02 DIAGNOSIS — J438 Other emphysema: Secondary | ICD-10-CM

## 2012-12-02 DIAGNOSIS — J439 Emphysema, unspecified: Secondary | ICD-10-CM

## 2012-12-02 DIAGNOSIS — I4891 Unspecified atrial fibrillation: Secondary | ICD-10-CM

## 2012-12-02 LAB — POCT INR: INR: 2

## 2012-12-02 NOTE — Patient Instructions (Signed)
Follow-up annually

## 2012-12-02 NOTE — Progress Notes (Signed)
12/02/2012 Heather [name]   28-Oct-1926  [id number]  Primary Physicia ARONSON,RICHARD A, MD Primary Cardiologist: Dr Rennis Golden  HPI:  Pleasant 79 y/o followed by Dr Rennis Golden with a history of PAF. She recently had a MET test that indicated Emphysema although she has no history of smoking. She is on chronic Coumadin. Her B/P has been controlled by her readings at home and our readings here by our pharmacist.  Echo in Feb 2013 showed an EF of >55%, NL LA size, and NL RVF.        She had an episode of weakness and palpitations 11/13/12 pm. EMS was contacted and she was taken to [facility]. She received Diltiazem en route and was in NSR when she arrived at [facility]. They suggested she increase her Diltiazem to 240 mg daily but she was hesitant to do this. She is seen now in the office for follow up. She still feels "weak"- no further palpitations.  This past week, Heather [name] started her on low-dose metoprolol. She seems to be tolerating that well and has had no further recurrence of her paroxysmal atrial fibrillation. This is her first episode that she was symptomatic with, but she had her initial episode in 2011. It seems that her episodes are very infrequent.   Current Outpatient Prescriptions  Medication Sig Dispense Refill  . calcium citrate-vitamin D (CITRACAL+D) 315-200 MG-UNIT per tablet Take 1 tablet by mouth daily.      Marland Kitchen diltiazem (CARDIZEM CD) 180 MG 24 hr capsule Take 180 mg by mouth daily.      . hydrochlorothiazide (HYDRODIURIL) 25 MG tablet Take 25 mg by mouth daily.      Marland Kitchen levothyroxine (SYNTHROID, LEVOTHROID) 137 MCG tablet Take 68.5-137 mcg by mouth daily. Take half tablet on Monday, Wednesday, and Friday. Take 1 tablet all other days      . losartan (COZAAR) 100 MG tablet Take 100 mg by mouth daily.      . metoprolol tartrate (LOPRESSOR) 25 MG tablet Take 0.5 tablets (12.5 mg total) by mouth 2 (two) times daily.  30 tablet  5  . Multiple Vitamins-Minerals (MULTIVITAMIN WITH MINERALS) tablet Take 1 tablet by  mouth daily.      Marland Kitchen warfarin (COUMADIN) 2.5 MG tablet Take 2.5-3.75 mg by mouth daily. Take 1.5 tablets on Monday and 1 tablet all other days       No current facility-administered medications for this visit.    Allergies  Allergen Reactions  . Ace Inhibitors Cough    History   Social History  . Marital Status: Married    Spouse Name: N/A    Number of Children: 2  . Years of Education: N/A   Occupational History  . Not on file.   Social History Main Topics  . Smoking status: Former Smoker -- 1.00 packs/day for 15 years    Types: Cigarettes    Quit date: 09/11/1988  . Smokeless tobacco: Never Used     Comment: quit 1990  . Alcohol Use: No  . Drug Use: No  . Sexual Activity: Yes   Other Topics Concern  . Not on file   Social History Narrative  . No narrative on file     Review of Systems: General: negative for chills, fever, night sweats or weight changes.  Cardiovascular: negative for chest pain, dyspnea on exertion, edema, orthopnea, palpitations, paroxysmal nocturnal dyspnea or shortness of breath Dermatological: negative for rash Respiratory: negative for cough or wheezing Urologic: negative for hematuria Abdominal: negative for nausea, vomiting, diarrhea,  bright red blood per rectum, melena, or hematemesis Neurologic: negative for visual changes, syncope, or dizziness All other systems reviewed and are otherwise negative except as noted above.    Blood pressure 108/72, pulse 64, height 5\' 5"  (1.651 m), weight 126 lb 11.2 oz (57.471 kg).  Deferred today  EKG  deferred  ASSESSMENT AND PLAN:   Patient Active Problem List   Diagnosis Date Noted  . Emphysema 11/15/2012  . HTN (hypertension) 11/15/2012  . PAF (paroxysmal atrial fibrillation) 06/14/2012  . Long term (current) use of anticoagulants 06/14/2012  . Ventral hernia 07/31/2011  . Abdominal wall mass 07/01/2011   PLAN: 1.  Heather [name] has had no further palpitations. She's tolerating low-dose  beta blocker in addition to her Cardizem. Blood pressure and heart rate are stable. Given the infrequency of her episodes of PAF, I think is reasonable to continue with a rate control strategy. She is therapeutic on warfarin. Should she develop additional breakthrough A. fib episodes which are symptomatic, would recommend antiarrhythmic therapy at that time.  Heather Nose, MD, [facility] Attending Cardiologist [facility]  Heather [name] C 12/02/2012 2:54 PM

## 2012-12-08 ENCOUNTER — Ambulatory Visit: Payer: Medicare Other | Admitting: Internal Medicine

## 2012-12-10 ENCOUNTER — Ambulatory Visit: Payer: Medicare Other | Admitting: Internal Medicine

## 2012-12-10 ENCOUNTER — Ambulatory Visit: Payer: Medicare Other | Admitting: Pharmacist Clinician (PhC)/ Clinical Pharmacy Specialist

## 2012-12-13 ENCOUNTER — Ambulatory Visit: Payer: Medicare Other | Admitting: Pharmacist Clinician (PhC)/ Clinical Pharmacy Specialist

## 2012-12-22 ENCOUNTER — Telehealth: Payer: Self-pay | Admitting: *Deleted

## 2012-12-22 NOTE — Telephone Encounter (Signed)
Faxed completed medical consult request to [facility] on 12/22/12

## 2013-01-12 ENCOUNTER — Ambulatory Visit: Payer: Medicare Other | Admitting: Pharmacist Clinician (PhC)/ Clinical Pharmacy Specialist

## 2013-01-16 ENCOUNTER — Emergency Department (HOSPITAL_COMMUNITY): Payer: Medicare Other

## 2013-01-16 ENCOUNTER — Encounter (HOSPITAL_COMMUNITY): Payer: Self-pay | Admitting: Emergency Medicine

## 2013-01-16 ENCOUNTER — Emergency Department (HOSPITAL_COMMUNITY)
Admission: EM | Admit: 2013-01-16 | Discharge: 2013-01-16 | Disposition: A | Discharge status: Home / Self Care | Payer: Medicare Other | Attending: Emergency Medicine | Admitting: Emergency Medicine

## 2013-01-16 DIAGNOSIS — S2239XA Fracture of one rib, unspecified side, initial encounter for closed fracture: Secondary | ICD-10-CM | POA: Insufficient documentation

## 2013-01-16 DIAGNOSIS — S2232XA Fracture of one rib, left side, initial encounter for closed fracture: Secondary | ICD-10-CM

## 2013-01-16 DIAGNOSIS — Y9389 Activity, other specified: Secondary | ICD-10-CM | POA: Insufficient documentation

## 2013-01-16 DIAGNOSIS — M810 Age-related osteoporosis without current pathological fracture: Secondary | ICD-10-CM | POA: Insufficient documentation

## 2013-01-16 DIAGNOSIS — Z8679 Personal history of other diseases of the circulatory system: Secondary | ICD-10-CM | POA: Insufficient documentation

## 2013-01-16 DIAGNOSIS — E039 Hypothyroidism, unspecified: Secondary | ICD-10-CM | POA: Insufficient documentation

## 2013-01-16 DIAGNOSIS — S0990XA Unspecified injury of head, initial encounter: Secondary | ICD-10-CM | POA: Insufficient documentation

## 2013-01-16 DIAGNOSIS — W010XXA Fall on same level from slipping, tripping and stumbling without subsequent striking against object, initial encounter: Secondary | ICD-10-CM

## 2013-01-16 DIAGNOSIS — M129 Arthropathy, unspecified: Secondary | ICD-10-CM | POA: Insufficient documentation

## 2013-01-16 DIAGNOSIS — F411 Generalized anxiety disorder: Secondary | ICD-10-CM | POA: Insufficient documentation

## 2013-01-16 DIAGNOSIS — R1012 Left upper quadrant pain: Secondary | ICD-10-CM | POA: Insufficient documentation

## 2013-01-16 DIAGNOSIS — G609 Hereditary and idiopathic neuropathy, unspecified: Secondary | ICD-10-CM | POA: Insufficient documentation

## 2013-01-16 DIAGNOSIS — W11XXXA Fall on and from ladder, initial encounter: Secondary | ICD-10-CM | POA: Insufficient documentation

## 2013-01-16 DIAGNOSIS — J4489 Other specified chronic obstructive pulmonary disease: Secondary | ICD-10-CM | POA: Insufficient documentation

## 2013-01-16 DIAGNOSIS — Y92009 Unspecified place in unspecified non-institutional (private) residence as the place of occurrence of the external cause: Secondary | ICD-10-CM | POA: Insufficient documentation

## 2013-01-16 DIAGNOSIS — J449 Chronic obstructive pulmonary disease, unspecified: Secondary | ICD-10-CM | POA: Insufficient documentation

## 2013-01-16 DIAGNOSIS — Z79899 Other long term (current) drug therapy: Secondary | ICD-10-CM | POA: Insufficient documentation

## 2013-01-16 DIAGNOSIS — Z7901 Long term (current) use of anticoagulants: Secondary | ICD-10-CM | POA: Insufficient documentation

## 2013-01-16 DIAGNOSIS — Z87891 Personal history of nicotine dependence: Secondary | ICD-10-CM | POA: Insufficient documentation

## 2013-01-16 DIAGNOSIS — I10 Essential (primary) hypertension: Secondary | ICD-10-CM | POA: Insufficient documentation

## 2013-01-16 LAB — CBC
Hemoglobin: 14.4 g/dL (ref 12.0–15.0)
MCHC: 35.7 g/dL (ref 30.0–36.0)
RDW: 13.9 % (ref 11.5–15.5)
WBC: 8.3 10*3/uL (ref 4.0–10.5)

## 2013-01-16 LAB — BASIC METABOLIC PANEL
GFR calc Af Amer: >90 mL/min (ref >90)
GFR calc non Af Amer: 79 mL/min — ABNORMAL LOW (ref >90)
Potassium: 3.9 mEq/L (ref 3.5–5.1)
Sodium: 129 mEq/L — ABNORMAL LOW (ref 135–145)

## 2013-01-16 LAB — PROTIME-INR
INR: 1.3 (ref 0.00–1.49)
Prothrombin Time: 15.9 seconds — ABNORMAL HIGH (ref 11.6–15.2)

## 2013-01-16 MED ORDER — TRAMADOL HCL 50 MG PO TABS: 50.0000 mg | ORAL_TABLET | Freq: Four times a day (QID) | ORAL | Status: DC | PRN

## 2013-01-16 MED ORDER — TRAMADOL HCL 50 MG PO TABS
50.0000 mg | ORAL_TABLET | Freq: Once | ORAL | Status: AC
  Administered 2013-01-16: 50 mg via ORAL
  Filled 2013-01-16: qty 1

## 2013-01-16 NOTE — ED Notes (Signed)
Bed: WA12 Expected date:  Expected time:  Means of arrival:  Comments: EMS 

## 2013-01-16 NOTE — ED Notes (Signed)
Patient transported to X-ray 

## 2013-01-16 NOTE — ED Notes (Addendum)
Per ems: Pt from home, fell last night from step stool, c/o left sided back pain. Pt had 150 of fentanyl

## 2013-01-16 NOTE — ED Provider Notes (Signed)
CSN: [id number]     Arrival date & time 01/16/13  7829 History   First MD Initiated Contact with Patient 01/16/13 [phone]     Chief Complaint  Patient presents with  . Fall   (Consider location/radiation/quality/duration/timing/severity/associated sxs/prior Treatment) Patient is a 79 y.o. female presenting with fall. The history is provided by the patient.  Fall Associated symptoms include headaches. Pertinent negatives include no chest pain, no abdominal pain and no shortness of breath.  pt s/p fall at home last pm. Was standing on step stool trying to reach cabinet, when stool slid, pt fell, landing on back side. Hit head. No loc. C/o pain left lateral ribs, and left lateral abd/flank. Moderate-severe. Constant. Non radiating. Worse w position changes or palpation.  Had headache earlier. Is on coumadin. Denies any abn bruising or bleeding. No sob. Pt denies neck pain. No radicular pain. No numbness/weakness.   Past Medical History  Diagnosis Date  . Atrial fibrillation   . 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 COPD (chronic obstructive pulmonary disease)   . Essential hypertension, benign   . Unspecified hypothyroidism   . Gallstones   . Peripheral neuropathy   . Venous insufficiency   . Osteoporosis   . Pneumonia     states she had it twice, last time a couple of months ago  . Shortness of breath     with exertion  . GERD (gastroesophageal reflux disease)   . H/O hiatal hernia   . Arthritis   . Anxiety   . Mild aortic insufficiency   . History of nuclear stress test 10/2010    dipyridamole; normal pattern of perfusion; low risk, normal study   Past Surgical History  Procedure Laterality Date  . Total abdominal hysterectomy  1970  . Tonsillectomy  1935  . Left hip relacement  2000  . Back surgery      x2  . Colon surgery  2008    partial  . Appendectomy  1935  . Cataract extraction w/ intraocular lens  implant, bilateral    . Ventral  hernia repair  09/19/2011    Procedure: LAPAROSCOPIC VENTRAL HERNIA;  Surgeon: Robyne Askew, MD;  Location: [facility];  Service: General;  Laterality: N/A;  laparoscopic ventral hernia repair with mesh  . Cardiometablic testing  05/24/2012    submaximal effort with peak RER of 0.5, peak VO2 79% predicted; HR peak up to 78%; PVC was 55% predicted, PEV1 39% predicted; PEV1/VC ratio was reduced; normal vital capacity; DLCO was reduced to 63%  . Transthoracic echocardiogram  05/2011    EF=>55%; mild conc LVH; mild mitral annular calcif; mild TR; AV mildly sclerotic & mild AR   Family History  Problem Relation Age of Onset  . Stroke Mother   . Stroke Father   . Heart block Brother   . Cancer Brother   . Diabetes Brother   . Stroke Brother    History  Substance Use Topics  . Smoking status: Former Smoker -- 1.00 packs/day for 15 years    Types: Cigarettes    Quit date: 09/11/1988  . Smokeless tobacco: Never Used     Comment: quit 1990  . Alcohol Use: No   OB History   Grav Para Term Preterm Abortions TAB SAB Ect Mult Living                 Review of Systems  Constitutional: Negative for fever and chills.  Eyes: Negative for redness.  Respiratory: Negative  for shortness of breath.   Cardiovascular: Negative for chest pain.  Gastrointestinal: Negative for nausea, vomiting and abdominal pain.  Genitourinary: Negative for hematuria.  Musculoskeletal: Negative for back pain and neck pain.  Skin: Negative for rash.  Neurological: Positive for headaches. Negative for weakness and numbness.  Hematological: Does not bruise/bleed easily.  Psychiatric/Behavioral: Negative for confusion.    Allergies  Ace inhibitors and Sulfur  Home Medications   Current Outpatient Rx  Name  Route  Sig  Dispense  Refill  . calcium citrate-vitamin D (CITRACAL+D) 315-200 MG-UNIT per tablet   Oral   Take 1 tablet by mouth daily.         Marland Kitchen diltiazem (CARDIZEM CD) 180 MG 24 hr capsule   Oral   Take  180 mg by mouth daily.         . hydrochlorothiazide (HYDRODIURIL) 25 MG tablet   Oral   Take 25 mg by mouth daily.         Marland Kitchen levothyroxine (SYNTHROID, LEVOTHROID) 137 MCG tablet   Oral   Take 68.5-137 mcg by mouth daily. Take half tablet on Monday, Wednesday, and Friday. Take 1 tablet all other days         . losartan (COZAAR) 100 MG tablet   Oral   Take 100 mg by mouth daily.         . metoprolol tartrate (LOPRESSOR) 25 MG tablet   Oral   Take 0.5 tablets (12.5 mg total) by mouth 2 (two) times daily.   30 tablet   5   . Multiple Vitamins-Minerals (MULTIVITAMIN WITH MINERALS) tablet   Oral   Take 1 tablet by mouth daily.         Marland Kitchen warfarin (COUMADIN) 2.5 MG tablet   Oral   Take 2.5-3.75 mg by mouth daily. Take 1.5 tablets on Monday and 1 tablet all other days          BP 145/82  Pulse 65  Temp(Src) 97.8 F (36.6 C) (Oral)  Resp 19  SpO2 95% Physical Exam  Nursing note and vitals reviewed. Constitutional: She is oriented to person, place, and time. She appears well-developed and well-nourished. No distress.  HENT:  Tenderness posterior scalp, mild sts.   Eyes: Conjunctivae are normal. No scleral icterus.  Neck: Normal range of motion. Neck supple. No tracheal deviation present.  Cardiovascular: Normal rate, normal heart sounds and intact distal pulses.   Pulmonary/Chest: Effort normal and breath sounds normal. No respiratory distress. She exhibits tenderness.  Left lateral and posterior chest wall/rib tenderness.   Abdominal: Soft. Normal appearance. She exhibits no distension and no mass. There is tenderness. There is no rebound and no guarding.  Left upper abd tenderness.   Genitourinary:  No cva tenderness  Musculoskeletal: She exhibits no edema.  CTLS spine, non tender, aligned, no step off. Good rom bil ext without pain or focal bony tenderness.   Neurological: She is alert and oriented to person, place, and time.  Motor intact bil.   Skin: Skin  is warm and dry. No rash noted.  Psychiatric: She has a normal mood and affect.    ED Course  Procedures (including critical care time)  Results for orders placed during the hospital encounter of 01/16/13  PROTIME-INR      Result Value Range   Prothrombin Time 15.9 (*) 11.6 - 15.2 seconds   INR 1.30  0.00 - 1.49  CBC      Result Value Range   WBC 8.3  4.0 -  10.5 K/uL   RBC 4.26  3.87 - 5.11 MIL/uL   Hemoglobin 14.4  12.0 - 15.0 g/dL   HCT 40.9  81.1 - 91.4 %   MCV 94.6  78.0 - 100.0 fL   MCH 33.8  26.0 - 34.0 pg   MCHC 35.7  30.0 - 36.0 g/dL   RDW 78.2  95.6 - 21.3 %   Platelets 273  150 - 400 K/uL  BASIC METABOLIC PANEL      Result Value Range   Sodium 129 (*) 135 - 145 mEq/L   Potassium 3.9  3.5 - 5.1 mEq/L   Chloride 92 (*) 96 - 112 mEq/L   CO2 28  19 - 32 mEq/L   Glucose, Bld 107 (*) 70 - 99 mg/dL   BUN 14  6 - 23 mg/dL   Creatinine, Ser 0.86  0.50 - 1.10 mg/dL   Calcium 9.1  8.4 - 57.8 mg/dL   GFR calc non Af Amer 79 (*) >90 mL/min   GFR calc Af Amer >90  >90 mL/min   Ct Abdomen Pelvis Wo Contrast  01/16/2013   CLINICAL DATA:  Left middle back pain. Status post recent fall. Patient on Coumadin.  EXAM: CT ABDOMEN AND PELVIS WITHOUT CONTRAST  TECHNIQUE: Multidetector CT imaging of the abdomen and pelvis was performed following the standard protocol without intravenous contrast.  COMPARISON:  07/08/2011.  FINDINGS: There is no intraperitoneal or retroperitoneal hemorrhage.  Mild subsegmental atelectasis and/or scarring is noted in the lung bases. The heart is normal in size.  Liver, spleen and pancreas are unremarkable. There is a gallstone within an otherwise normal gallbladder, stable. No bile duct dilation. No adrenal masses.  Right kidney is mildly displaced inferiorly and rotated. This is unchanged. Low density midpole left renal lesions seen consistent with a cyst. This is also stable. No other renal masses, no stones and no hydronephrosis. Normal ureters and bladder.   Uterus is surgically absent. No pelvic masses.  Dense atherosclerotic calcifications are noted along the abdominal aorta and its branch vessels. No aneurysm. No adenopathy is seen. There is no ascites.  No bowel wall thickening or inflammatory changes. There is no obstruction. Mild increased stool is noted in the right colon.  The bones are demineralized. There are changes from a previous L4-L5 and L5-S1 fusion and bilateral hemi laminectomies. No osteoblastic or osteolytic lesions.  IMPRESSION: 1. No acute findings. 2. No evidence of intraperitoneal or retroperitoneal hemorrhage. 3. Multiple chronic findings as detailed, stable from the prior study.   Electronically Signed   By: Amie Portland M.D.   On: 01/16/2013 10:53   Dg Ribs Unilateral W/chest Left  01/16/2013   CLINICAL DATA:  Fall. Left rib pain.  EXAM: LEFT RIBS AND CHEST - 3+ VIEW  COMPARISON:  None.  FINDINGS: No fracture or other bone lesions are seen involving the ribs. There is no evidence of pneumothorax or pleural effusion. Both lungs are clear. Heart size and mediastinal contours are within normal limits.  Subacute left 5th, 6th, 7th and 8th lateral rib fractures with healing. Acute minimally displaced left 9th rib fracture.  IMPRESSION: Subacute and acute left-sided rib fractures.  No pneumothorax.   Electronically Signed   By: Maryclare Bean M.D.   On: 01/16/2013 10:40   Ct Head Wo Contrast  01/16/2013   CLINICAL DATA:  Fall, on Coumadin  EXAM: CT HEAD WITHOUT CONTRAST  TECHNIQUE: Contiguous axial images were obtained from the base of the skull through the vertex without intravenous  contrast.  COMPARISON:  None.  FINDINGS: Negative for acute intracranial hemorrhage, acute infarction, mass, mass effect, hydrocephalus or midline shift. Gray-white differentiation is preserved throughout. Global cerebral and cerebellar atrophy. Periventricular, subcortical and deep white matter hypoattenuation which is nonspecific but most suggestive of  longstanding microvascular ischemic white matter disease. Small lacunar infarct versus dilated perivascular space in the left lentiform nucleus. Globes and orbits are intact and unremarkable bilaterally. No focal scalp hematoma. No acute calvarial abnormality. Normal aeration of the mastoid air cells and paranasal sinuses. Atherosclerotic calcification noted in the cavernous carotid arteries bilaterally.  IMPRESSION: 1. No acute intracranial abnormality. 2. Atrophy and chronic microvascular ischemic white matter disease. 3. Intracranial atherosclerosis.   Electronically Signed   By: Malachy Moan M.D.   On: 01/16/2013 10:54      EKG Interpretation   None       MDM  Xr/ct.  Reviewed nursing notes and prior charts for additional history.   Recheck pt comfortable.  Had received iv pain meds just pta from [facility].  Will give dose ultram and rx for home.  Discussed xr w pt, pt notes prior rib fractures/old.  Discussed new 9th rib fx w pt.  No increased wob. Pulse ox 98% room air.  Spine nt.  Pt appears stable for d/c.       Suzi Roots, MD 01/16/13 [phone]

## 2013-01-19 ENCOUNTER — Ambulatory Visit (INDEPENDENT_AMBULATORY_CARE_PROVIDER_SITE_OTHER): Payer: Medicare Other | Admitting: Pharmacist Clinician (PhC)/ Clinical Pharmacy Specialist

## 2013-01-19 VITALS — BP 156/86 | HR 76

## 2013-01-19 DIAGNOSIS — I4891 Unspecified atrial fibrillation: Secondary | ICD-10-CM

## 2013-01-19 DIAGNOSIS — Z7901 Long term (current) use of anticoagulants: Secondary | ICD-10-CM

## 2013-01-19 DIAGNOSIS — I480 Paroxysmal atrial fibrillation: Secondary | ICD-10-CM

## 2013-02-16 ENCOUNTER — Ambulatory Visit (INDEPENDENT_AMBULATORY_CARE_PROVIDER_SITE_OTHER): Payer: Medicare Other | Admitting: Pharmacist Clinician (PhC)/ Clinical Pharmacy Specialist

## 2013-02-16 VITALS — BP 146/70 | HR 84

## 2013-02-16 DIAGNOSIS — I4891 Unspecified atrial fibrillation: Secondary | ICD-10-CM

## 2013-02-16 DIAGNOSIS — I480 Paroxysmal atrial fibrillation: Secondary | ICD-10-CM

## 2013-02-16 DIAGNOSIS — Z1231 Encounter for screening mammogram for malignant neoplasm of breast: Secondary | ICD-10-CM

## 2013-02-16 DIAGNOSIS — Z7901 Long term (current) use of anticoagulants: Secondary | ICD-10-CM

## 2013-03-03 ENCOUNTER — Ambulatory Visit (INDEPENDENT_AMBULATORY_CARE_PROVIDER_SITE_OTHER): Payer: Medicare Other | Admitting: Pharmacist Clinician (PhC)/ Clinical Pharmacy Specialist

## 2013-03-03 ENCOUNTER — Telehealth: Payer: Self-pay | Admitting: Pharmacist Clinician (PhC)/ Clinical Pharmacy Specialist

## 2013-03-03 VITALS — BP 110/66 | HR 84

## 2013-03-03 DIAGNOSIS — I480 Paroxysmal atrial fibrillation: Secondary | ICD-10-CM

## 2013-03-03 DIAGNOSIS — Z7901 Long term (current) use of anticoagulants: Secondary | ICD-10-CM

## 2013-03-03 DIAGNOSIS — I4891 Unspecified atrial fibrillation: Secondary | ICD-10-CM

## 2013-03-03 LAB — POCT INR: INR: 2

## 2013-03-03 NOTE — Telephone Encounter (Signed)
Has been sick for several days, now on cefdinir 300mg  bid, started Friday, was advised to get INR checked within 1 week.  Made appt for her to come in today at 3pm.

## 2013-03-16 ENCOUNTER — Ambulatory Visit: Payer: Medicare Other | Admitting: Pharmacist Clinician (PhC)/ Clinical Pharmacy Specialist

## 2013-04-01 ENCOUNTER — Ambulatory Visit (INDEPENDENT_AMBULATORY_CARE_PROVIDER_SITE_OTHER): Payer: Medicare Other | Admitting: Pharmacist Clinician (PhC)/ Clinical Pharmacy Specialist

## 2013-04-01 VITALS — BP 158/110 | HR 88

## 2013-04-01 DIAGNOSIS — I4891 Unspecified atrial fibrillation: Secondary | ICD-10-CM

## 2013-04-01 DIAGNOSIS — I480 Paroxysmal atrial fibrillation: Secondary | ICD-10-CM

## 2013-04-01 DIAGNOSIS — Z7901 Long term (current) use of anticoagulants: Secondary | ICD-10-CM

## 2013-04-01 LAB — POCT INR: INR: 2.2

## 2013-04-25 ENCOUNTER — Ambulatory Visit (INDEPENDENT_AMBULATORY_CARE_PROVIDER_SITE_OTHER): Payer: Medicare Other | Admitting: Emergency Medicine

## 2013-04-25 ENCOUNTER — Encounter: Payer: Self-pay | Admitting: Emergency Medicine

## 2013-04-25 VITALS — BP 136/74 | HR 80 | Temp 97.7°F | Ht 65.0 in | Wt 122.8 lb

## 2013-04-25 DIAGNOSIS — R0609 Other forms of dyspnea: Secondary | ICD-10-CM

## 2013-04-25 DIAGNOSIS — R0989 Other specified symptoms and signs involving the circulatory and respiratory systems: Secondary | ICD-10-CM

## 2013-04-25 NOTE — Assessment & Plan Note (Signed)
Her CPST 8/15 certainly points to obstructive disease although she has remained SOB on LABA/ICS trials by Dr Ferne Reus. No evidence PAH on TTE from 2013. On walking oximetry today she did not desaturate but had to stop a few times and did not complete the full distance. Her HR went from 70's to 103 and remained regular.  - will repeat her full PFT to confirm the CPST results.  - will try an alternative BD, probably an a-agent to see if she will benefit,  - rov next avail

## 2013-04-25 NOTE — Progress Notes (Signed)
Subjective:    Patient ID: Heather [name], female    DOB: 10-21-1926, 80 y.o.   MRN: [id number]  HPI 80 yo former smoker, hx of HTN, A Fib, GERD, HH, hypothyroidism, progressive dyspnea for 3 years that has worsened significantly over last 6 months. She also hears a wheeze, happens with exertion. She is followed by Dr Ferne Reus who discovered that she was in A fib around the time her dyspnea was first evaluated. She underwent CPST in 2/14 that suggested a resp limitation with severe AFL. Her cardiac stress test in 2012 was normal. She has been tried on Symbicort and Advair without benefit. Her thyroid testing has been good.    Review of Systems  Constitutional: Positive for unexpected weight change. Negative for fever and chills.  HENT: Positive for rhinorrhea and trouble swallowing. Negative for congestion, dental problem, ear pain, nosebleeds, postnasal drip, sinus pressure, sneezing, sore throat and voice change.   Eyes: Negative for visual disturbance.  Respiratory: Positive for shortness of breath. Negative for cough and choking.   Cardiovascular: Positive for leg swelling. Negative for chest pain.  Gastrointestinal: Positive for nausea and abdominal pain. Negative for vomiting and diarrhea.  Genitourinary: Negative for difficulty urinating.  Musculoskeletal: Negative for arthralgias.  Skin: Negative for rash.  Neurological: Negative for tremors, syncope and headaches.  Hematological: Does not bruise/bleed easily.   Past Medical History  Diagnosis Date  . Atrial fibrillation   . 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 COPD (chronic obstructive pulmonary disease)   . Essential hypertension, benign   . Unspecified hypothyroidism   . Gallstones   . Peripheral neuropathy   . Venous insufficiency   . Osteoporosis   . Pneumonia     states she had it twice, last time a couple of months ago  . Shortness of breath     with exertion  . GERD  (gastroesophageal reflux disease)   . H/O hiatal hernia   . Arthritis   . Anxiety   . Mild aortic insufficiency   . History of nuclear stress test 10/2010    dipyridamole; normal pattern of perfusion; low risk, normal study     Family History  Problem Relation Age of Onset  . Stroke Mother   . Stroke Father   . Heart block Brother   . Bladder Cancer Brother   . Diabetes Brother   . Stroke Brother      History   Social History  . Marital Status: Married    Spouse Name: N/A    Number of Children: 2  . Years of Education: N/A   Occupational History  . Retired Astronomer    Social History Main Topics  . Smoking status: Former Smoker -- 1.00 packs/day for 20 years    Types: Cigarettes    Quit date: 09/11/1988  . Smokeless tobacco: Never Used  . Alcohol Use: No  . Drug Use: No  . Sexual Activity: Yes   Other Topics Concern  . Not on file   Social History Narrative  . No narrative on file     Allergies  Allergen Reactions  . Ace Inhibitors Cough  . Sulfur Nausea And Vomiting     Outpatient Prescriptions Prior to Visit  Medication Sig Dispense Refill  . calcium citrate-vitamin D (CITRACAL+D) 315-200 MG-UNIT per tablet Take 1 tablet by mouth daily.      Marland Kitchen diltiazem (CARDIZEM CD) 180 MG 24 hr capsule Take 180 mg by mouth daily.      Marland Kitchen  hydrochlorothiazide (HYDRODIURIL) 25 MG tablet Take 25 mg by mouth daily.      Marland Kitchen levothyroxine (SYNTHROID, LEVOTHROID) 137 MCG tablet Take half tablet on Monday, Wednesday, and Friday. Take 1 tablet all other days      . losartan (COZAAR) 100 MG tablet Take 100 mg by mouth daily.      . metoprolol tartrate (LOPRESSOR) 25 MG tablet Take 0.5 tablets (12.5 mg total) by mouth 2 (two) times daily.  30 tablet  5  . Multiple Vitamins-Minerals (MULTIVITAMIN WITH MINERALS) tablet Take 1 tablet by mouth daily.      . traMADol (ULTRAM) 50 MG tablet Take 1 tablet (50 mg total) by mouth every 6 (six) hours as needed for pain.  20 tablet  0  .  warfarin (COUMADIN) 2.5 MG tablet Take 2.5-3.75 mg by mouth daily. Take 1.5 tablets on Monday and 1 tablet all other days       No facility-administered medications prior to visit.         Objective:   Physical Exam Filed Vitals:   04/25/13 1629  BP: 136/74  Pulse: 80  Temp: 97.7 F (36.5 C)  TempSrc: Oral  Height: 5\' 5"  (1.651 m)  Weight: 122 lb 12.8 oz (55.702 kg)  SpO2: 97%   Gen: Pleasant, thin woman, in no distress,  normal affect, some kyphosis  ENT: No lesions,  mouth clear,  oropharynx clear, no postnasal drip  Neck: No JVD, no TMG, no carotid bruits  Lungs: No use of accessory muscles, distant, clear without rales or rhonchi  Cardiovascular: RRR with a rare skipped beat, heart sounds normal, no murmur or gallops, no peripheral edema  Musculoskeletal: No deformities, no cyanosis or clubbing  Neuro: alert, non focal  Skin: Warm, no lesions or rashes      Assessment & Plan:  Dyspnea on exertion Her CPST 0/93 certainly points to obstructive disease although she has remained SOB on LABA/ICS trials by Dr Ferne Reus. No evidence PAH on TTE from 2013. On walking oximetry today she did not desaturate but had to stop a few times and did not complete the full distance. Her HR went from 70's to 103 and remained regular.  - will repeat her full PFT to confirm the CPST results.  - will try an alternative BD, probably an a-agent to see if she will benefit,  - rov next avail

## 2013-04-25 NOTE — Patient Instructions (Signed)
Your walk test did not show low oxygen levels or a fast heart rate today We will perform full pulmonary function testing at your next office visit Follow with Dr Lamonte Sakai next available appointment with full PFT

## 2013-05-04 ENCOUNTER — Ambulatory Visit (INDEPENDENT_AMBULATORY_CARE_PROVIDER_SITE_OTHER): Payer: Medicare Other | Admitting: Pharmacist Clinician (PhC)/ Clinical Pharmacy Specialist

## 2013-05-04 VITALS — BP 140/80 | HR 84

## 2013-05-04 DIAGNOSIS — I4891 Unspecified atrial fibrillation: Secondary | ICD-10-CM

## 2013-05-04 DIAGNOSIS — I480 Paroxysmal atrial fibrillation: Secondary | ICD-10-CM

## 2013-05-04 DIAGNOSIS — Z7901 Long term (current) use of anticoagulants: Secondary | ICD-10-CM

## 2013-05-04 LAB — POCT INR: INR: 3.3

## 2013-05-17 ENCOUNTER — Encounter (HOSPITAL_COMMUNITY): Payer: Self-pay | Admitting: Emergency Medicine

## 2013-05-17 ENCOUNTER — Emergency Department (HOSPITAL_COMMUNITY): Payer: Medicare Other

## 2013-05-17 ENCOUNTER — Inpatient Hospital Stay (HOSPITAL_COMMUNITY)
Admission: EM | Admit: 2013-05-17 | Discharge: 2013-05-23 | DRG: 190 | Disposition: A | Discharge status: Home / Self Care | Payer: Medicare Other | Attending: Internal Medicine | Admitting: Internal Medicine

## 2013-05-17 DIAGNOSIS — E039 Hypothyroidism, unspecified: Secondary | ICD-10-CM | POA: Diagnosis present

## 2013-05-17 DIAGNOSIS — Z833 Family history of diabetes mellitus: Secondary | ICD-10-CM

## 2013-05-17 DIAGNOSIS — J1100 Influenza due to unidentified influenza virus with unspecified type of pneumonia: Secondary | ICD-10-CM | POA: Diagnosis present

## 2013-05-17 DIAGNOSIS — R7309 Other abnormal glucose: Secondary | ICD-10-CM | POA: Diagnosis present

## 2013-05-17 DIAGNOSIS — Z79899 Other long term (current) drug therapy: Secondary | ICD-10-CM

## 2013-05-17 DIAGNOSIS — E739 Lactose intolerance, unspecified: Secondary | ICD-10-CM | POA: Diagnosis present

## 2013-05-17 DIAGNOSIS — K219 Gastro-esophageal reflux disease without esophagitis: Secondary | ICD-10-CM | POA: Diagnosis present

## 2013-05-17 DIAGNOSIS — I480 Paroxysmal atrial fibrillation: Secondary | ICD-10-CM | POA: Diagnosis present

## 2013-05-17 DIAGNOSIS — J441 Chronic obstructive pulmonary disease with (acute) exacerbation: Principal | ICD-10-CM | POA: Diagnosis present

## 2013-05-17 DIAGNOSIS — I4891 Unspecified atrial fibrillation: Secondary | ICD-10-CM | POA: Diagnosis present

## 2013-05-17 DIAGNOSIS — J189 Pneumonia, unspecified organism: Secondary | ICD-10-CM | POA: Diagnosis present

## 2013-05-17 DIAGNOSIS — I10 Essential (primary) hypertension: Secondary | ICD-10-CM | POA: Diagnosis present

## 2013-05-17 DIAGNOSIS — I359 Nonrheumatic aortic valve disorder, unspecified: Secondary | ICD-10-CM | POA: Diagnosis present

## 2013-05-17 DIAGNOSIS — D72819 Decreased white blood cell count, unspecified: Secondary | ICD-10-CM | POA: Diagnosis present

## 2013-05-17 DIAGNOSIS — Z823 Family history of stroke: Secondary | ICD-10-CM

## 2013-05-17 DIAGNOSIS — I872 Venous insufficiency (chronic) (peripheral): Secondary | ICD-10-CM | POA: Diagnosis present

## 2013-05-17 DIAGNOSIS — I959 Hypotension, unspecified: Secondary | ICD-10-CM | POA: Diagnosis present

## 2013-05-17 DIAGNOSIS — G609 Hereditary and idiopathic neuropathy, unspecified: Secondary | ICD-10-CM | POA: Diagnosis present

## 2013-05-17 DIAGNOSIS — Z87891 Personal history of nicotine dependence: Secondary | ICD-10-CM

## 2013-05-17 DIAGNOSIS — Z7901 Long term (current) use of anticoagulants: Secondary | ICD-10-CM

## 2013-05-17 DIAGNOSIS — M810 Age-related osteoporosis without current pathological fracture: Secondary | ICD-10-CM | POA: Diagnosis present

## 2013-05-17 DIAGNOSIS — J449 Chronic obstructive pulmonary disease, unspecified: Secondary | ICD-10-CM | POA: Diagnosis present

## 2013-05-17 DIAGNOSIS — Z8052 Family history of malignant neoplasm of bladder: Secondary | ICD-10-CM

## 2013-05-17 HISTORY — DX: Other specified postprocedural states: Z98.890

## 2013-05-17 HISTORY — DX: Other specified postprocedural states: R11.2

## 2013-05-17 LAB — URINALYSIS, ROUTINE W REFLEX MICROSCOPIC
Bilirubin Urine: NEGATIVE
Glucose, UA: NEGATIVE mg/dL
Hgb urine dipstick: NEGATIVE
Ketones, ur: NEGATIVE mg/dL
Leukocytes, UA: NEGATIVE
Nitrite: NEGATIVE
Protein, ur: NEGATIVE mg/dL
Specific Gravity, Urine: 1.013 (ref 1.005–1.030)
Urobilinogen, UA: 0.2 mg/dL (ref 0.0–1.0)
pH: 7.5 (ref 5.0–8.0)

## 2013-05-17 LAB — CBC WITH DIFFERENTIAL/PLATELET
Basophils Absolute: 0 10*3/uL (ref 0.0–0.1)
Basophils Relative: 0 % (ref 0–1)
Eosinophils Absolute: 0.1 10*3/uL (ref 0.0–0.7)
Eosinophils Relative: 1 % (ref 0–5)
HCT: 41.9 % (ref 36.0–46.0)
Hemoglobin: 14.9 g/dL (ref 12.0–15.0)
Lymphocytes Relative: 15 % (ref 12–46)
Lymphs Abs: 0.8 10*3/uL (ref 0.7–4.0)
MCH: 33.8 pg (ref 26.0–34.0)
MCHC: 35.6 g/dL (ref 30.0–36.0)
MCV: 95 fL (ref 78.0–100.0)
Monocytes Absolute: 0.5 10*3/uL (ref 0.1–1.0)
Monocytes Relative: 10 % (ref 3–12)
Neutro Abs: 4 10*3/uL (ref 1.7–7.7)
Neutrophils Relative %: 74 % (ref 43–77)
Platelets: 199 10*3/uL (ref 150–400)
RBC: 4.41 MIL/uL (ref 3.87–5.11)
RDW: 13.8 % (ref 11.5–15.5)
WBC: 5.5 10*3/uL (ref 4.0–10.5)

## 2013-05-17 LAB — BASIC METABOLIC PANEL
BUN: 14 mg/dL (ref 6–23)
CO2: 24 mEq/L (ref 19–32)
Calcium: 8.6 mg/dL (ref 8.4–10.5)
Chloride: 97 mEq/L (ref 96–112)
Creatinine, Ser: 0.59 mg/dL (ref 0.50–1.10)
GFR calc Af Amer: >90 mL/min (ref >90)
GFR calc non Af Amer: 82 mL/min — ABNORMAL LOW (ref >90)
Glucose, Bld: 116 mg/dL — ABNORMAL HIGH (ref 70–99)
Potassium: 3.3 mEq/L — ABNORMAL LOW (ref 3.7–5.3)
Sodium: 136 mEq/L — ABNORMAL LOW (ref 137–147)

## 2013-05-17 LAB — PROTIME-INR
INR: 1.85 — ABNORMAL HIGH (ref 0.00–1.49)
Prothrombin Time: 20.8 seconds — ABNORMAL HIGH (ref 11.6–15.2)

## 2013-05-17 MED ORDER — ACETAMINOPHEN 650 MG RE SUPP: 650.0000 mg | Freq: Four times a day (QID) | RECTAL | Status: DC | PRN

## 2013-05-17 MED ORDER — IBUPROFEN 200 MG PO TABS
200.0000 mg | ORAL_TABLET | Freq: Once | ORAL | Status: AC
  Administered 2013-05-17: 200 mg via ORAL
  Filled 2013-05-17: qty 1

## 2013-05-17 MED ORDER — ALUM & MAG HYDROXIDE-SIMETH 200-200-20 MG/5ML PO SUSP
30.0000 mL | Freq: Four times a day (QID) | ORAL | Status: DC | PRN
  Filled 2013-05-17: qty 30

## 2013-05-17 MED ORDER — SODIUM CHLORIDE 0.9 % IV SOLN
INTRAVENOUS | Status: DC
  Administered 2013-05-17 – 2013-05-19 (×4): via INTRAVENOUS

## 2013-05-17 MED ORDER — CALCIUM CARBONATE-VITAMIN D 500-200 MG-UNIT PO TABS
1.0000 | ORAL_TABLET | Freq: Every day | ORAL | Status: DC
  Administered 2013-05-17 – 2013-05-23 (×7): 1 via ORAL
  Filled 2013-05-17 (×7): qty 1

## 2013-05-17 MED ORDER — LEVOTHYROXINE SODIUM 137 MCG PO TABS
137.0000 ug | ORAL_TABLET | Freq: Every day | ORAL | Status: DC
  Administered 2013-05-18 – 2013-05-23 (×6): 137 ug via ORAL
  Filled 2013-05-17 (×7): qty 1

## 2013-05-17 MED ORDER — ALBUTEROL SULFATE (2.5 MG/3ML) 0.083% IN NEBU
5.0000 mg | INHALATION_SOLUTION | Freq: Once | RESPIRATORY_TRACT | Status: AC
  Administered 2013-05-17: 5 mg via RESPIRATORY_TRACT
  Filled 2013-05-17: qty 6

## 2013-05-17 MED ORDER — SODIUM CHLORIDE 0.9 % IJ SOLN
3.0000 mL | Freq: Two times a day (BID) | INTRAMUSCULAR | Status: DC
  Administered 2013-05-17 – 2013-05-23 (×10): 3 mL via INTRAVENOUS

## 2013-05-17 MED ORDER — IPRATROPIUM BROMIDE 0.02 % IN SOLN
0.5000 mg | Freq: Once | RESPIRATORY_TRACT | Status: AC
  Administered 2013-05-17: 0.5 mg via RESPIRATORY_TRACT
  Filled 2013-05-17: qty 2.5

## 2013-05-17 MED ORDER — LEVOFLOXACIN IN D5W 750 MG/150ML IV SOLN
750.0000 mg | Freq: Once | INTRAVENOUS | Status: AC
  Administered 2013-05-17: 750 mg via INTRAVENOUS
  Filled 2013-05-17: qty 150

## 2013-05-17 MED ORDER — ADULT MULTIVITAMIN W/MINERALS CH
1.0000 | ORAL_TABLET | Freq: Every day | ORAL | Status: DC
  Administered 2013-05-17 – 2013-05-23 (×7): 1 via ORAL
  Filled 2013-05-17 (×7): qty 1

## 2013-05-17 MED ORDER — ONDANSETRON HCL 4 MG/2ML IJ SOLN: 4.0000 mg | Freq: Four times a day (QID) | INTRAMUSCULAR | Status: DC | PRN

## 2013-05-17 MED ORDER — ACETAMINOPHEN 325 MG PO TABS: 650.0000 mg | ORAL_TABLET | Freq: Four times a day (QID) | ORAL | Status: DC | PRN

## 2013-05-17 MED ORDER — GUAIFENESIN-DM 100-10 MG/5ML PO SYRP: 5.0000 mL | ORAL_SOLUTION | ORAL | Status: DC | PRN

## 2013-05-17 MED ORDER — WARFARIN SODIUM 2.5 MG PO TABS
3.7500 mg | ORAL_TABLET | Freq: Once | ORAL | Status: AC
  Administered 2013-05-17: 3.75 mg via ORAL
  Filled 2013-05-17: qty 1

## 2013-05-17 MED ORDER — OSELTAMIVIR PHOSPHATE 75 MG PO CAPS
75.0000 mg | ORAL_CAPSULE | Freq: Two times a day (BID) | ORAL | Status: DC
  Filled 2013-05-17 (×3): qty 1

## 2013-05-17 MED ORDER — ONDANSETRON HCL 4 MG PO TABS
4.0000 mg | ORAL_TABLET | Freq: Four times a day (QID) | ORAL | Status: DC | PRN
  Administered 2013-05-20: 4 mg via ORAL
  Filled 2013-05-17: qty 1

## 2013-05-17 MED ORDER — IPRATROPIUM BROMIDE 0.02 % IN SOLN
0.5000 mg | Freq: Four times a day (QID) | RESPIRATORY_TRACT | Status: DC
  Administered 2013-05-17 – 2013-05-20 (×13): 0.5 mg via RESPIRATORY_TRACT
  Filled 2013-05-17 (×13): qty 2.5

## 2013-05-17 MED ORDER — WARFARIN - PHARMACIST DOSING INPATIENT: Freq: Every day | Status: DC

## 2013-05-17 MED ORDER — CALCIUM CITRATE-VITAMIN D 315-200 MG-UNIT PO TABS: 1.0000 | ORAL_TABLET | Freq: Every day | ORAL | Status: DC

## 2013-05-17 MED ORDER — LEVOFLOXACIN IN D5W 750 MG/150ML IV SOLN
750.0000 mg | INTRAVENOUS | Status: DC
  Administered 2013-05-17 – 2013-05-18 (×2): 750 mg via INTRAVENOUS
  Filled 2013-05-17 (×3): qty 150

## 2013-05-17 MED ORDER — LOSARTAN POTASSIUM 50 MG PO TABS
100.0000 mg | ORAL_TABLET | Freq: Every day | ORAL | Status: DC
  Administered 2013-05-18 – 2013-05-23 (×6): 100 mg via ORAL
  Filled 2013-05-17 (×6): qty 2

## 2013-05-17 MED ORDER — DILTIAZEM HCL ER COATED BEADS 180 MG PO CP24
180.0000 mg | ORAL_CAPSULE | Freq: Every day | ORAL | Status: DC
  Administered 2013-05-17 – 2013-05-23 (×7): 180 mg via ORAL
  Filled 2013-05-17 (×7): qty 1

## 2013-05-17 MED ORDER — POLYETHYLENE GLYCOL 3350 17 G PO PACK
17.0000 g | PACK | Freq: Every day | ORAL | Status: DC | PRN
  Administered 2013-05-18 – 2013-05-23 (×3): 17 g via ORAL
  Filled 2013-05-17 (×4): qty 1

## 2013-05-17 MED ORDER — LEVALBUTEROL HCL 0.63 MG/3ML IN NEBU
0.6300 mg | INHALATION_SOLUTION | Freq: Four times a day (QID) | RESPIRATORY_TRACT | Status: DC
  Administered 2013-05-17 – 2013-05-20 (×13): 0.63 mg via RESPIRATORY_TRACT
  Filled 2013-05-17 (×23): qty 3

## 2013-05-17 NOTE — ED Provider Notes (Signed)
CSN: [id number]     Arrival date & time 05/17/13  0539 History   First MD Initiated Contact with Patient 05/17/13 1005     Chief Complaint  Patient presents with  . Shortness of Breath  . Nausea     (Consider location/radiation/quality/duration/timing/severity/associated sxs/prior Treatment) HPI Patient presents to the emergency department with cough, fever, shortness of breath, and nausea over the last 3 days.  Patient, states she has had a six-month history worsening shortness of breath, for which she has seen a pulmonologist.  Patient, states, that nothing seems make her condition, better, but exertion makes her condition worse.  Patient, states, that she did not take any medications prior to arrival.  Patient denies chest pain, vomiting, abdominal pain, dysuria, weakness, dizziness, rash or syncope.  The patient, states, that she's had a history of pneumonia. Past Medical History  Diagnosis Date  . Atrial fibrillation   . 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 COPD (chronic obstructive pulmonary disease)   . Essential hypertension, benign   . Unspecified hypothyroidism   . Gallstones   . Peripheral neuropathy   . Venous insufficiency   . Osteoporosis   . Pneumonia     states she had it twice, last time a couple of months ago  . Shortness of breath     with exertion  . GERD (gastroesophageal reflux disease)   . H/O hiatal hernia   . Arthritis   . Anxiety   . Mild aortic insufficiency   . History of nuclear stress test 10/2010    dipyridamole; normal pattern of perfusion; low risk, normal study   Past Surgical History  Procedure Laterality Date  . Total abdominal hysterectomy  1970  . Tonsillectomy  1935  . Left hip relacement  2000  . Back surgery      x2  . Colon surgery  2008    partial  . Appendectomy  1935  . Cataract extraction w/ intraocular lens  implant, bilateral    . Ventral hernia repair  09/19/2011    Procedure:  LAPAROSCOPIC VENTRAL HERNIA;  Surgeon: Merrie Roof, MD;  Location: [facility];  Service: General;  Laterality: N/A;  laparoscopic ventral hernia repair with mesh  . Cardiometablic testing  7/67/3419    submaximal effort with peak RER of 0.5, peak VO2 79% predicted; HR peak up to 78%; PVC was 55% predicted, PEV1 39% predicted; PEV1/VC ratio was reduced; normal vital capacity; DLCO was reduced to 63%  . Transthoracic echocardiogram  05/2011    EF=>55%; mild conc LVH; mild mitral annular calcif; mild TR; AV mildly sclerotic & mild AR   Family History  Problem Relation Age of Onset  . Stroke Mother   . Stroke Father   . Heart block Brother   . Bladder Cancer Brother   . Diabetes Brother   . Stroke Brother    History  Substance Use Topics  . Smoking status: Former Smoker -- 1.00 packs/day for 20 years    Types: Cigarettes    Quit date: 09/11/1988  . Smokeless tobacco: Never Used  . Alcohol Use: No   OB History   Grav Para Term Preterm Abortions TAB SAB Ect Mult Living                 Review of Systems  All other systems negative except as documented in the HPI. All pertinent positives and negatives as reviewed in the HPI.  Allergies  Ace inhibitors and Sulfur  Home Medications  Current Outpatient Rx  Name  Route  Sig  Dispense  Refill  . calcium citrate-vitamin D (CITRACAL+D) 315-200 MG-UNIT per tablet   Oral   Take 1 tablet by mouth daily.         Marland Kitchen diltiazem (CARDIZEM CD) 180 MG 24 hr capsule   Oral   Take 180 mg by mouth daily.         . hydrochlorothiazide (HYDRODIURIL) 25 MG tablet   Oral   Take 25 mg by mouth daily.         Marland Kitchen levothyroxine (SYNTHROID, LEVOTHROID) 137 MCG tablet      Take half tablet on Monday, Wednesday, and Friday. Take 1 tablet all other days         . losartan (COZAAR) 100 MG tablet   Oral   Take 100 mg by mouth daily.         . Multiple Vitamins-Minerals (MULTIVITAMIN WITH MINERALS) tablet   Oral   Take 1 tablet by mouth  daily.         Marland Kitchen warfarin (COUMADIN) 2.5 MG tablet   Oral   Take 2.5-3.75 mg by mouth daily. Take 1.5  Tablets (3.75 mg) on Monday and 1 tablet (2.5 mg) on Tuesday, Wednesday, Thursday, Friday, Saturday, and Sunday.          BP 105/50  Pulse 115  Temp(Src) 101.5 F (38.6 C) (Rectal)  Resp 20  SpO2 97% Physical Exam  Nursing note and vitals reviewed. Constitutional: She is oriented to person, place, and time. She appears well-developed and well-nourished. No distress.  HENT:  Head: Normocephalic and atraumatic.  Mouth/Throat: Oropharynx is clear and moist.  Eyes: Pupils are equal, round, and reactive to light.  Neck: Normal range of motion. Neck supple.  Cardiovascular: Normal rate, regular rhythm and normal heart sounds.  Exam reveals no gallop and no friction rub.   No murmur heard. Pulmonary/Chest: Effort normal and breath sounds normal. No respiratory distress. She has no wheezes.  Neurological: She is alert and oriented to person, place, and time.  Skin: Skin is warm and dry. No rash noted. No erythema.    ED Course  Procedures (including critical care time) Labs Review Labs Reviewed  BASIC METABOLIC PANEL - Abnormal; Notable for the following:    Sodium 136 (*)    Potassium 3.3 (*)    Glucose, Bld 116 (*)    GFR calc non Af Amer 82 (*)    All other components within normal limits  CBC WITH DIFFERENTIAL  URINALYSIS, ROUTINE W REFLEX MICROSCOPIC   Imaging Review Dg Chest 2 View  05/17/2013   CLINICAL DATA:  Five day history of productive cough. ; history of atrial fibrillation, COPD, and previous smoking history  EXAM: CHEST  2 VIEW  COMPARISON:  CT ABD/PELV WO CM dated 01/16/2013; DG RIBS UNILATERAL W/CHEST*L* dated 01/16/2013  FINDINGS: The lungs are mildly hyperinflated. There is no focal infiltrate. The interstitial markings are increased in the left infrahilar region consistent with subsegmental atelectasis. The cardiac silhouette is normal in size. The  pulmonary vascularity is not engorged. The mediastinum is normal in width. There is curvature of the mid thoracic spine with the convexity towards the left. The observed portions of the rib cage exhibit no acute abnormalities. Old fractures of left lower ribs laterally are demonstrated. These have healed with residual deformity. There is prominent thoracic kyphosis.  IMPRESSION: 1. Increased density in the left infrahilar region suggests subsegmental atelectasis or early interstitial pneumonia. The interstitial markings  of both lungs are mildly increased elsewhere. Mild hyperinflation is consistent with COPD. 2. There is no evidence of CHF.   Electronically Signed   By: David  Martinique   On: 05/17/2013 12:33     The patient will be admitted to the hospital for what appears to be a pneumonia.  The patient is advised of the plan and Dr. Brigitte Pulse was contacted from her doctor's office.  He'll be in to see the patient for admission.  Patient was started on [place], [place] 05/17/13 1519

## 2013-05-17 NOTE — Progress Notes (Signed)
Attempted to get report from ED RN, Caren Griffins, placed on hold for five minutes.

## 2013-05-17 NOTE — ED Provider Notes (Signed)
Medical screening examination/treatment/procedure(s) were performed by non-physician practitioner and as supervising physician I was immediately available for consultation/collaboration.  EKG Interpretation   None         Hoy Morn, MD 05/17/13 [phone]

## 2013-05-17 NOTE — ED Notes (Addendum)
Pt states symptoms started on Sunday, c/o SOB, nausea, no vomiting, SOB, has appt with pulmonary to evaluate chronic SOB, has been febrile, temp 101 yesterday, has been taking tylenol, dd not have flu or pna vaccine

## 2013-05-17 NOTE — ED Notes (Signed)
Patient tried to urinate. Will call when she has to go

## 2013-05-17 NOTE — Progress Notes (Signed)
Utilization Review completed.  Hadley Soileau RN CM  

## 2013-05-17 NOTE — ED Notes (Signed)
Bed: PE16 Expected date:  Expected time:  Means of arrival:  Comments: Flu like sx

## 2013-05-17 NOTE — H&P (Signed)
PCP:   Geoffery Lyons, MD   Chief Complaint:  Cough, shortness of breath  HPI: Heather [name] is an 80 year old white female with a history of COPD, paroxysmal atrial fibrillation on anticoagulation, and chronic dyspnea on exertion who presented to the emergency department with the complaint of cough and worsening shortness of breath. Patient has a history of COPD as diagnosed on prior cardiopulmonary stress test/PFTs. She reports asubacute increase in dyspnea on exertion over the past 6 months to one year. She has seen pulmonology recently and was scheduled for followup PFTs. She's tried Symbicort and Advair without improvement so she stopped these medications. Over the past 3 days she's had an acute change in her condition with increasing sore throat, cough, congestion, and sputum production with yellow green sputum. She fever up to around 101 2 days ago. She thought she was feeling a little better yesterday but today awoke with increased symptoms and generalized malaise/fatigue prompting her to come to the emergency department.  In the emergency department she is found have a temperature of 101.5, oxygen saturation 85% on room air, and chest x-ray suggestive of left perihilar pneumonia. She is being admitted for further management.   Review of Systems:  Review of Systems - All systems reviewed with the patient and are negative except in history of present illness with following exceptions: Left thoracic back pain began in the emergency department. Past Medical History: Past Medical History  Diagnosis Date  . Atrial fibrillation   . 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 COPD (chronic obstructive pulmonary disease)   . Essential hypertension, benign   . Unspecified hypothyroidism   . Gallstones   . Peripheral neuropathy   . Venous insufficiency   . Osteoporosis   . Pneumonia     states she had it twice, last time a couple of months ago  . Shortness of  breath     with exertion  . GERD (gastroesophageal reflux disease)   . H/O hiatal hernia   . Arthritis   . Anxiety   . Mild aortic insufficiency   . History of nuclear stress test 10/2010    dipyridamole; normal pattern of perfusion; low risk, normal study  . PONV (postoperative nausea and vomiting)    HTN, Hypothyroidism, Osteoporosis, COPD, Gallstones, IGT, peripheral neuropathy, venous insufficiency atrial fibrillation-rvr 7/11 copd-pft 2013  Past Surgical History  Procedure Laterality Date  . Total abdominal hysterectomy  1970  . Tonsillectomy  1935  . Left hip relacement  2000  . Back surgery      x2  . Colon surgery  2008    partial  . Appendectomy  1935  . Cataract extraction w/ intraocular lens  implant, bilateral    . Ventral hernia repair  09/19/2011    Procedure: LAPAROSCOPIC VENTRAL HERNIA;  Surgeon: Merrie Roof, MD;  Location: [facility];  Service: General;  Laterality: N/A;  laparoscopic ventral hernia repair with mesh  . Cardiometablic testing  4/97/0263    submaximal effort with peak RER of 0.5, peak VO2 79% predicted; HR peak up to 78%; PVC was 55% predicted, PEV1 39% predicted; PEV1/VC ratio was reduced; normal vital capacity; DLCO was reduced to 63%  . Transthoracic echocardiogram  05/2011    EF=>55%; mild conc LVH; mild mitral annular calcif; mild TR; AV mildly sclerotic & mild AR    Appendectomy TAH Tonsillectomy 1935 L Hip replacement 2000 Back Surgery x 2 Hysterectomy 1970 Colon Surgery 9part of colon removed, had to war colostomy  for 5 months) 2008 ct angio chest and pft 2009--copd  Medications: Prior to Admission medications   Medication Sig Start Date End Date Taking? Authorizing Provider  calcium citrate-vitamin D (CITRACAL+D) 315-200 MG-UNIT per tablet Take 1 tablet by mouth daily.   Yes Historical Provider, MD  diltiazem (CARDIZEM CD) 180 MG 24 hr capsule Take 180 mg by mouth daily.   Yes Historical Provider, MD  hydrochlorothiazide  (HYDRODIURIL) 25 MG tablet Take 25 mg by mouth daily.   Yes Historical Provider, MD  levothyroxine (SYNTHROID, LEVOTHROID) 137 MCG tablet Take half tablet on Monday, Wednesday, and Friday. Take 1 tablet all other days   Yes Historical Provider, MD  losartan (COZAAR) 100 MG tablet Take 100 mg by mouth daily.   Yes Historical Provider, MD  Multiple Vitamins-Minerals (MULTIVITAMIN WITH MINERALS) tablet Take 1 tablet by mouth daily.   Yes Historical Provider, MD  warfarin (COUMADIN) 2.5 MG tablet Take 2.5-3.75 mg by mouth daily. Take 1.5  Tablets (3.75 mg) on Monday and 1 tablet (2.5 mg) on Tuesday, Wednesday, Thursday, Friday, Saturday, and Sunday.   Yes Historical Provider, MD    Allergies:   Allergies  Allergen Reactions  . Ace Inhibitors Cough  . Sulfur Nausea And Vomiting    Social History:  reports that she quit smoking about 24 years ago. Her smoking use included Cigarettes. She has a 20 pack-year smoking history. She has never used smokeless tobacco. She reports that she does not drink alcohol or use illicit drugs.  Married, 2 children.  Retired.  Former tobacco abuse, quit 1990.  Does not drink alcohol.   Family History: Family History  Problem Relation Age of Onset  . Stroke Mother   . Stroke Father   . Heart block Brother   . Bladder Cancer Brother   . Diabetes Brother   . Stroke Brother     Physical Exam: Filed Vitals:   05/17/13 0959 05/17/13 1058 05/17/13 1252 05/17/13 1316  BP: 109/57   105/50  Pulse: 119   115  Temp: 99.5 F (37.5 C)  101.5 F (38.6 C)   TempSrc: Oral  Rectal   Resp:    20  SpO2: 96% 85%  97%   General appearance: alert and no distress Head: Normocephalic, without obvious abnormality, atraumatic Eyes: conjunctivae/corneas clear. PERRL, EOM's intact.  Nose: Nares normal. Septum midline. Mucosa normal. No drainage or sinus tenderness. Throat: lips, mucosa, and tongue normal; teeth and gums normal Neck: no adenopathy, no carotid bruit, no JVD  and thyroid not enlarged, symmetric, no tenderness/mass/nodules Resp: Minimal expiratory wheezing throughout bilateral lung fields Cardio: Distant heart sounds, regular rate and rhythm without murmurs rubs or gallop GI: soft, non-tender; bowel sounds normal; no masses,  no organomegaly Extremities: extremities normal, atraumatic, no cyanosis or edema Pulses: 2+ and symmetric Lymph nodes: Cervical adenopathy: no cervical lymphadenopathy Neurologic: Alert and oriented X 3, normal strength and tone. Normal symmetric reflexes.     Labs on Admission:   Recent Labs  05/17/13 1059  NA 136*  K 3.3*  CL 97  CO2 24  GLUCOSE 116*  BUN 14  CREATININE 0.59  CALCIUM 8.6     Recent Labs  05/17/13 1059  WBC 5.5  NEUTROABS 4.0  HGB 14.9  HCT 41.9  MCV 95.0  PLT 199    Lab Results  Component Value Date   INR 3.3 05/04/2013   INR 2.2 04/01/2013   INR 2 03/03/2013    Radiological Exams on Admission: Dg Chest 2 View  05/17/2013   CLINICAL DATA:  Five day history of productive cough. ; history of atrial fibrillation, COPD, and previous smoking history  EXAM: CHEST  2 VIEW  COMPARISON:  CT ABD/PELV WO CM dated 01/16/2013; DG RIBS UNILATERAL W/CHEST*L* dated 01/16/2013  FINDINGS: The lungs are mildly hyperinflated. There is no focal infiltrate. The interstitial markings are increased in the left infrahilar region consistent with subsegmental atelectasis. The cardiac silhouette is normal in size. The pulmonary vascularity is not engorged. The mediastinum is normal in width. There is curvature of the mid thoracic spine with the convexity towards the left. The observed portions of the rib cage exhibit no acute abnormalities. Old fractures of left lower ribs laterally are demonstrated. These have healed with residual deformity. There is prominent thoracic kyphosis.  IMPRESSION: 1. Increased density in the left infrahilar region suggests subsegmental atelectasis or early interstitial pneumonia. The  interstitial markings of both lungs are mildly increased elsewhere. Mild hyperinflation is consistent with COPD. 2. There is no evidence of CHF.   Electronically Signed   By: David  Martinique   On: 05/17/2013 12:33   Orders placed in visit on 12/02/12  . EKG 12-LEAD  . EKG 12-LEAD    Assessment/Plan Active Problems: 1. Community acquired pneumonia- increased mucus production, fever, and chest x-ray findings are suggestive of Community Acquired Pneumonia. We'll treat with Levaquin and bronchodilators in the setting of her COPD.  Obtain influenza swab and start Tamiflu empirically given outbreak. 2. COPD with acute exacerbation-secondary to CAP. We'll treat with Xopenex and Atrovent nebulizers. Emphasized importance of maintenance regimen even if she does not feel that it is helpful- consider Spiriva or home nebulizers.  Patient education provided-we'll try to reinforce with patient education materials while inpatient.  Follow-up with pulmonology. 3. PAF (paroxysmal atrial fibrillation)- she appears to be in sinus rhythm currently but is at high risk for recurrence of A. fib given her acute pulmonary issues. Monitor on telemetry.  Continue Coumadin. 4. HTN (hypertension)- continue losartan. We'll hold her HCTZ due to relative hypotension and poor by mouth intake 5. Disposition-anticipate discharge to home in 1-2 days if oxygen requirement resolves and doing well with antibiotic therapy.  Seira Cody,W DOUGLAS 05/17/2013, 2:40 PM

## 2013-05-17 NOTE — ED Notes (Signed)
Per EMS, called for fever, flu like symptoms, pale, very SOB, temp 104, c/o nausea, pulse ox 93% on RA, h/o A fib, denies pain, Tylenol 1000mg  and Zofran 4 and IV fluids given, #20 AC right arm

## 2013-05-17 NOTE — ED Notes (Signed)
SOB worsened about six months ago, but patient has been feeling bad recently and coughing.  Patient reports she has had a fever at home.  Denies vomiting or diarrhea but has felt nauseated.  Coughing up clear sputum.

## 2013-05-17 NOTE — Progress Notes (Signed)
ANTICOAGULATION CONSULT NOTE - Initial Consult  Pharmacy Consult for warfarin  Indication: atrial fibrillation  Allergies  Allergen Reactions  . Ace Inhibitors Cough  . Sulfur Nausea And Vomiting    Patient Measurements:    Vital Signs: Temp: 98.6 F (37 C) (02/17 1746) Temp src: Oral (02/17 1746) BP: 91/54 mmHg (02/17 1746) Pulse Rate: 91 (02/17 1746)  Labs:  Recent Labs  05/17/13 1059 05/17/13 1626  HGB 14.9  --   HCT 41.9  --   PLT 199  --   LABPROT  --  20.8*  INR  --  1.85*  CREATININE 0.59  --     The CrCl is unknown because both a height and weight (above a minimum accepted value) are required for this calculation.   Medical History: Past Medical History  Diagnosis Date  . Atrial fibrillation   . 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 COPD (chronic obstructive pulmonary disease)   . Essential hypertension, benign   . Unspecified hypothyroidism   . Gallstones   . Peripheral neuropathy   . Venous insufficiency   . Osteoporosis   . Pneumonia     states she had it twice, last time a couple of months ago  . Shortness of breath     with exertion  . GERD (gastroesophageal reflux disease)   . H/O hiatal hernia   . Arthritis   . Anxiety   . Mild aortic insufficiency   . History of nuclear stress test 10/2010    dipyridamole; normal pattern of perfusion; low risk, normal study  . PONV (postoperative nausea and vomiting)     Medications:  Scheduled:  . calcium citrate-vitamin D  1 tablet Oral Daily  . diltiazem  180 mg Oral Daily  . ipratropium  0.5 mg Nebulization Q6H  . levalbuterol  0.63 mg Nebulization Q6H  . levofloxacin (LEVAQUIN) IV  750 mg Intravenous Q24H  . [START ON 05/18/2013] levothyroxine  137 mcg Oral QAC breakfast  . [START ON 05/18/2013] losartan  100 mg Oral Daily  . multivitamin with minerals  1 tablet Oral Daily  . oseltamivir  75 mg Oral BID  . sodium chloride  3 mL Intravenous Q12H    Infusions:  . sodium chloride     PRN: acetaminophen, acetaminophen, alum & mag hydroxide-simeth, guaiFENesin-dextromethorphan, ondansetron (ZOFRAN) IV, ondansetron, polyethylene glycol  Assessment:  80 yo F  With hx of PAF on chronic anticoagulation with warfarin, presents to ER with COPD exacerbation and CAP  PTA warfarin dose = 2.5 mg on all days except for Monday take 3.75 mg; Patients last reported dose 2/16  INR 1.85 on admission, slightly below goal of 2-3  CBC okay  Drug Interaction - Levaquin has potential to elevate INR    Goal of Therapy:  INR 2-3 Monitor platelets by anticoagulation protocol: Yes   Plan:  1.) Give 3.375 mg tonight, which is slightly above her home dose for today given her INR below goal 2.) Monitor for D-D interactions  3.) Daily PT/INR   Aidee Latimore, Gaye Alken PharmD Pager #: [phone] 5:49 PM 05/17/2013

## 2013-05-18 LAB — INFLUENZA PANEL BY PCR (TYPE A & B)
H1N1 flu by pcr: NOT DETECTED
INFLAPCR: NEGATIVE
INFLBPCR: POSITIVE — AB

## 2013-05-18 LAB — CBC
HEMATOCRIT: 36.2 % (ref 36.0–46.0)
Hemoglobin: 12.4 g/dL (ref 12.0–15.0)
MCH: 33.1 pg (ref 26.0–34.0)
MCHC: 34.3 g/dL (ref 30.0–36.0)
MCV: 96.5 fL (ref 78.0–100.0)
Platelets: 163 10*3/uL (ref 150–400)
RBC: 3.75 MIL/uL — ABNORMAL LOW (ref 3.87–5.11)
RDW: 14.3 % (ref 11.5–15.5)
WBC: 2.9 10*3/uL — ABNORMAL LOW (ref 4.0–10.5)

## 2013-05-18 LAB — PROTIME-INR
INR: 1.89 — ABNORMAL HIGH (ref 0.00–1.49)
Prothrombin Time: 21.1 seconds — ABNORMAL HIGH (ref 11.6–15.2)

## 2013-05-18 LAB — BASIC METABOLIC PANEL
BUN: 16 mg/dL (ref 6–23)
CHLORIDE: 98 meq/L (ref 96–112)
CO2: 25 mEq/L (ref 19–32)
CREATININE: 0.63 mg/dL (ref 0.50–1.10)
Calcium: 8.3 mg/dL — ABNORMAL LOW (ref 8.4–10.5)
GFR calc Af Amer: >90 mL/min (ref >90)
GFR calc non Af Amer: 80 mL/min — ABNORMAL LOW (ref >90)
GLUCOSE: 97 mg/dL (ref 70–99)
Potassium: 3.5 mEq/L — ABNORMAL LOW (ref 3.7–5.3)
Sodium: 134 mEq/L — ABNORMAL LOW (ref 137–147)

## 2013-05-18 MED ORDER — WARFARIN SODIUM 4 MG PO TABS
4.0000 mg | ORAL_TABLET | Freq: Once | ORAL | Status: AC
  Administered 2013-05-18: 4 mg via ORAL
  Filled 2013-05-18: qty 1

## 2013-05-18 MED ORDER — ZOLPIDEM TARTRATE 5 MG PO TABS
5.0000 mg | ORAL_TABLET | Freq: Every evening | ORAL | Status: DC | PRN
  Administered 2013-05-19 – 2013-05-22 (×5): 5 mg via ORAL
  Filled 2013-05-18 (×5): qty 1

## 2013-05-18 MED ORDER — OSELTAMIVIR PHOSPHATE 75 MG PO CAPS
75.0000 mg | ORAL_CAPSULE | Freq: Every day | ORAL | Status: AC
  Administered 2013-05-18 – 2013-05-21 (×4): 75 mg via ORAL
  Filled 2013-05-18 (×5): qty 1

## 2013-05-18 NOTE — Progress Notes (Signed)
Subjective: Better-still significant cough and dyspnea with even speaking  Objective: Vital signs in last 24 hours: Temp:  [98.3 F (36.8 C)-99.2 F (37.3 C)] 99.2 F (37.3 C) (02/18 0438) Pulse Rate:  [83-98] 83 (02/18 0438) Resp:  [20-24] 20 (02/18 0438) BP: (91-116)/(52-61) 116/61 mmHg (02/18 0438) SpO2:  [95 %-100 %] 95 % (02/18 0841) Weight:  [56.9 kg (125 lb 7.1 oz)] 56.9 kg (125 lb 7.1 oz) (02/17 2100) Weight change:   CBG (last 3)  No results found for this basename: GLUCAP,  in the last 72 hours  Intake/Output from previous day: 02/17 0701 - 02/18 0700 In: 1113.8 [I.V.:963.8; IV Piggyback:150] Out: 100 [Urine:100]  Physical Exam: Awake, alert, coughing No jve Coarse bs bilaterally--mild wheeze bilateral Hs distant, regular Abdomen benign Neuro normal   Lab Results:  Recent Labs  05/17/13 1059 05/18/13 0348  NA 136* 134*  K 3.3* 3.5*  CL 97 98  CO2 24 25  GLUCOSE 116* 97  BUN 14 16  CREATININE 0.59 0.63  CALCIUM 8.6 8.3*   No results found for this basename: AST, ALT, ALKPHOS, BILITOT, PROT, ALBUMIN,  in the last 72 hours  Recent Labs  05/17/13 1059 05/18/13 0348  WBC 5.5 2.9*  NEUTROABS 4.0  --   HGB 14.9 12.4  HCT 41.9 36.2  MCV 95.0 96.5  PLT 199 163   Lab Results  Component Value Date   INR 1.89* 05/18/2013   INR 1.85* 05/17/2013   INR 3.3 05/04/2013   No results found for this basename: CKTOTAL, CKMB, CKMBINDEX, TROPONINI,  in the last 72 hours No results found for this basename: TSH, T4TOTAL, FREET3, T3FREE, THYROIDAB,  in the last 72 hours No results found for this basename: VITAMINB12, FOLATE, FERRITIN, TIBC, IRON, RETICCTPCT,  in the last 72 hours  Studies/Results: Dg Chest 2 View  05/17/2013   CLINICAL DATA:  Five day history of productive cough. ; history of atrial fibrillation, COPD, and previous smoking history  EXAM: CHEST  2 VIEW  COMPARISON:  CT ABD/PELV WO CM dated 01/16/2013; DG RIBS UNILATERAL W/CHEST*L* dated 01/16/2013   FINDINGS: The lungs are mildly hyperinflated. There is no focal infiltrate. The interstitial markings are increased in the left infrahilar region consistent with subsegmental atelectasis. The cardiac silhouette is normal in size. The pulmonary vascularity is not engorged. The mediastinum is normal in width. There is curvature of the mid thoracic spine with the convexity towards the left. The observed portions of the rib cage exhibit no acute abnormalities. Old fractures of left lower ribs laterally are demonstrated. These have healed with residual deformity. There is prominent thoracic kyphosis.  IMPRESSION: 1. Increased density in the left infrahilar region suggests subsegmental atelectasis or early interstitial pneumonia. The interstitial markings of both lungs are mildly increased elsewhere. Mild hyperinflation is consistent with COPD. 2. There is no evidence of CHF.   Electronically Signed   By: David  Martinique   On: 05/17/2013 12:33     Assessment/Plan: 1. Community acquired pneumonia- increased mucus production, fever, and chest x-ray findings are suggestive of Community Acquired Pneumonia. We'll treat with Levaquin and bronchodilators in the setting of her COPD. Obtain influenza swab and start Tamiflu empirically given outbreak.  2. COPD with acute exacerbation-secondary to CAP. We'll treat with Xopenex and Atrovent nebulizers. Emphasized importance of maintenance regimen even if she does not feel that it is helpful- consider Spiriva or home nebulizers. Patient education provided-we'll try to reinforce with patient education materials while inpatient. Follow-up with pulmonology.  3. PAF (paroxysmal atrial fibrillation)- she appears to be in sinus rhythm currently but is at high risk for recurrence of A. fib given her acute pulmonary issues. Monitor on telemetry. Continue Coumadin.  4. HTN (hypertension)- continue losartan. We'll hold her HCTZ due to relative hypotension and poor by mouth intake      LOS: 1 day   Gabrial Domine A 05/18/2013, 1:34 PM

## 2013-05-18 NOTE — Progress Notes (Signed)
Patient requested a sleep aide. Dr. Jacquiline Doe on call phone service @ [phone] was notified.

## 2013-05-18 NOTE — Progress Notes (Signed)
Nutrition Brief Note  Patient identified on the Malnutrition Screening Tool (MST) Report  Wt Readings from Last 15 Encounters:  05/17/13 125 lb 7.1 oz (56.9 kg)  04/25/13 122 lb 12.8 oz (55.702 kg)  12/02/12 126 lb 11.2 oz (57.471 kg)  11/15/12 126 lb 4.8 oz (57.289 kg)  11/20/11 134 lb (60.782 kg)  10/14/11 131 lb 2 oz (59.478 kg)  09/19/11 129 lb (58.514 kg)  09/19/11 129 lb (58.514 kg)  09/15/11 129 lb (58.514 kg)  09/01/11 128 lb (58.06 kg)  07/31/11 133 lb (60.328 kg)  07/01/11 136 lb 9.6 oz (61.961 kg)    Body mass index is 20.87 kg/(m^2). Patient meets criteria for Normal weight based on current BMI.   Current diet order is Regular, patient is consuming approximately 75% of meals at this time. Labs and medications reviewed.   Pt denied any changes in appetite or weight pta. Does have some nausea with foods, which pt reported she is working with her primary care physician to improve. Diet recall indicated pt consuming three meals/day with occasional snacks. Denied any nausea during hospital admit and w/good appetite.  No nutrition interventions warranted at this time. If nutrition issues arise, please consult RD.   Atlee Abide MS RD LDN Clinical Dietitian [phone]

## 2013-05-18 NOTE — Progress Notes (Signed)
ANTICOAGULATION CONSULT NOTE - Follow Up Consult  Pharmacy Consult for warfarin  Indication: atrial fibrillation  Allergies  Allergen Reactions  . Ace Inhibitors Cough  . Sulfur Nausea And Vomiting    Patient Measurements: Height: 5\' 5"  (165.1 cm) Weight: 125 lb 7.1 oz (56.9 kg) IBW/kg (Calculated) : 57 Heparin Dosing Weight:   Vital Signs: Temp: 99.2 F (37.3 C) (02/18 0438) Temp src: Oral (02/18 0438) BP: 116/61 mmHg (02/18 0438) Pulse Rate: 83 (02/18 0438)  Labs:  Recent Labs  05/17/13 1059 05/17/13 1626 05/18/13 0348  HGB 14.9  --  12.4  HCT 41.9  --  36.2  PLT 199  --  163  LABPROT  --  20.8* 21.1*  INR  --  1.85* 1.89*  CREATININE 0.59  --  0.63    Estimated Creatinine Clearance: 47 ml/min (by C-G formula based on Cr of 0.63).   Assessment: 80 yo F With hx of PAF on chronic anticoagulation with warfarin, presents to ER with COPD exacerbation and CAP. Home warfarin dosing = 2.5mg  daily except 3.75mg  on Monday  Today's INR = 1.89  CBC: Hgb and platelets WNL and stable  Drug Interaction - Levaquin has potential to elevate INR  Goal of Therapy:  INR 2-3 Monitor platelets by anticoagulation protocol: Yes   Plan:   INR subtherapeutic, give boosted warfarin dose 4mg  PO x 1 tonight  Daily INR  Watch for potential interaction with antibiotic  Doreene Eland, PharmD, BCPS.   Pager: [phone] 05/18/2013 7:54 AM

## 2013-05-19 LAB — BASIC METABOLIC PANEL
BUN: 12 mg/dL (ref 6–23)
CO2: 25 mEq/L (ref 19–32)
CREATININE: 0.59 mg/dL (ref 0.50–1.10)
Calcium: 8.2 mg/dL — ABNORMAL LOW (ref 8.4–10.5)
Chloride: 99 mEq/L (ref 96–112)
GFR calc non Af Amer: 82 mL/min — ABNORMAL LOW (ref >90)
Glucose, Bld: 96 mg/dL (ref 70–99)
Potassium: 3.5 mEq/L — ABNORMAL LOW (ref 3.7–5.3)
Sodium: 137 mEq/L (ref 137–147)

## 2013-05-19 LAB — CBC
HEMATOCRIT: 37.1 % (ref 36.0–46.0)
Hemoglobin: 12.7 g/dL (ref 12.0–15.0)
MCH: 33 pg (ref 26.0–34.0)
MCHC: 34.2 g/dL (ref 30.0–36.0)
MCV: 96.4 fL (ref 78.0–100.0)
PLATELETS: 139 10*3/uL — AB (ref 150–400)
RBC: 3.85 MIL/uL — AB (ref 3.87–5.11)
RDW: 14.3 % (ref 11.5–15.5)
WBC: 2.5 10*3/uL — ABNORMAL LOW (ref 4.0–10.5)

## 2013-05-19 LAB — PROTIME-INR
INR: 1.59 — ABNORMAL HIGH (ref 0.00–1.49)
Prothrombin Time: 18.5 seconds — ABNORMAL HIGH (ref 11.6–15.2)

## 2013-05-19 MED ORDER — LEVOFLOXACIN 750 MG PO TABS
750.0000 mg | ORAL_TABLET | Freq: Every day | ORAL | Status: DC
  Administered 2013-05-19 – 2013-05-20 (×2): 750 mg via ORAL
  Filled 2013-05-19 (×3): qty 1

## 2013-05-19 MED ORDER — ALBUTEROL SULFATE (2.5 MG/3ML) 0.083% IN NEBU
2.5000 mg | INHALATION_SOLUTION | Freq: Four times a day (QID) | RESPIRATORY_TRACT | Status: DC
  Administered 2013-05-19: 2.5 mg via RESPIRATORY_TRACT
  Filled 2013-05-19 (×2): qty 3

## 2013-05-19 MED ORDER — WARFARIN SODIUM 5 MG PO TABS
5.0000 mg | ORAL_TABLET | Freq: Once | ORAL | Status: AC
  Administered 2013-05-19: 5 mg via ORAL
  Filled 2013-05-19 (×2): qty 1

## 2013-05-19 MED ORDER — ALBUTEROL SULFATE HFA 108 (90 BASE) MCG/ACT IN AERS: 2.0000 | INHALATION_SPRAY | Freq: Four times a day (QID) | RESPIRATORY_TRACT | Status: DC

## 2013-05-19 MED ORDER — HYDROCOD POLST-CHLORPHEN POLST 10-8 MG/5ML PO LQCR
5.0000 mL | Freq: Two times a day (BID) | ORAL | Status: DC | PRN
  Administered 2013-05-19 – 2013-05-22 (×4): 5 mL via ORAL
  Filled 2013-05-19 (×4): qty 5

## 2013-05-19 NOTE — Progress Notes (Signed)
ANTICOAGULATION CONSULT NOTE - Follow Up Consult  Pharmacy Consult for warfarin  Indication: atrial fibrillation  Allergies  Allergen Reactions  . Ace Inhibitors Cough  . Sulfur Nausea And Vomiting    Patient Measurements: Height: 5\' 5"  (165.1 cm) Weight: 125 lb 7.1 oz (56.9 kg) IBW/kg (Calculated) : 57 Heparin Dosing Weight:   Vital Signs: Temp: 98.3 F (36.8 C) (02/19 0450) Temp src: Oral (02/19 0450) BP: 141/61 mmHg (02/19 0450) Pulse Rate: 81 (02/19 0450)  Labs:  Recent Labs  05/17/13 1059 05/17/13 1626 05/18/13 0348 05/19/13 0350 05/19/13 0540  HGB 14.9  --  12.4  --  12.7  HCT 41.9  --  36.2  --  37.1  PLT 199  --  163  --  139*  LABPROT  --  20.8* 21.1* 18.5*  --   INR  --  1.85* 1.89* 1.59*  --   CREATININE 0.59  --  0.63 0.59  --     Estimated Creatinine Clearance: 47 ml/min (by C-G formula based on Cr of 0.59).   Assessment: 80 yo F With hx of PAF on chronic anticoagulation with warfarin, presents to ER with COPD exacerbation and CAP. Home warfarin dosing = 2.5mg  daily except 3.75mg  on Monday  Today's INR = 1.59 (SUBtherapeutic) - trending down despite higher dose last pm.   CBC: Hgb stable, platelets = 139 (trending down)  Drug Interaction - Levaquin has potential to elevate INR  Goal of Therapy:  INR 2-3 Monitor platelets by anticoagulation protocol: Yes   Plan:   INR subtherapeutic, INR trending downward, give boosted warfarin dose 5mg  PO x 1 tonight  Daily INR  Watch for potential interaction with antibiotic  Watch platelets  Doreene Eland, PharmD, BCPS.   Pager: [phone] 05/19/2013 8:57 AM

## 2013-05-19 NOTE — Progress Notes (Signed)
Subjective: Patient is doing well sitting up in a chair no oxygen comfortable continues to have cough but claims to be much better  Objective: Vital signs in last 24 hours: Temp:  [98 F (36.7 C)-98.8 F (37.1 C)] 98 F (36.7 C) (02/19 0846) Pulse Rate:  [72-93] 76 (02/19 0846) Resp:  [18] 18 (02/19 0450) BP: (104-141)/(55-75) 141/63 mmHg (02/19 0846) SpO2:  [90 %-96 %] 90 % (02/19 0939) Weight change:   CBG (last 3)  No results found for this basename: GLUCAP,  in the last 72 hours  Intake/Output from previous day: 02/18 0701 - 02/19 0700 In: 2073.8 [P.O.:1200; I.V.:873.8] Out: 850 [Urine:850]  Physical Exam: Awake alert no distress conversant No JVD or bruits Chest clear with some rales left lower lung field Irregular rhythm in the 70s distant no murmur Abdomen soft and nontender No peripheral edema intact pulses Neurologic exam is normal      Lab Results:  Recent Labs  05/18/13 0348 05/19/13 0350  NA 134* 137  K 3.5* 3.5*  CL 98 99  CO2 25 25  GLUCOSE 97 96  BUN 16 12  CREATININE 0.63 0.59  CALCIUM 8.3* 8.2*   No results found for this basename: AST, ALT, ALKPHOS, BILITOT, PROT, ALBUMIN,  in the last 72 hours  Recent Labs  05/17/13 1059 05/18/13 0348 05/19/13 0540  WBC 5.5 2.9* 2.5*  NEUTROABS 4.0  --   --   HGB 14.9 12.4 12.7  HCT 41.9 36.2 37.1  MCV 95.0 96.5 96.4  PLT 199 163 139*   Lab Results  Component Value Date   INR 1.59* 05/19/2013   INR 1.89* 05/18/2013   INR 1.85* 05/17/2013   No results found for this basename: CKTOTAL, CKMB, CKMBINDEX, TROPONINI,  in the last 72 hours No results found for this basename: TSH, T4TOTAL, FREET3, T3FREE, THYROIDAB,  in the last 72 hours No results found for this basename: VITAMINB12, FOLATE, FERRITIN, TIBC, IRON, RETICCTPCT,  in the last 72 hours  Studies/Results: Dg Chest 2 View  05/17/2013   CLINICAL DATA:  Five day history of productive cough. ; history of atrial fibrillation, COPD, and previous  smoking history  EXAM: CHEST  2 VIEW  COMPARISON:  CT ABD/PELV WO CM dated 01/16/2013; DG RIBS UNILATERAL W/CHEST*L* dated 01/16/2013  FINDINGS: The lungs are mildly hyperinflated. There is no focal infiltrate. The interstitial markings are increased in the left infrahilar region consistent with subsegmental atelectasis. The cardiac silhouette is normal in size. The pulmonary vascularity is not engorged. The mediastinum is normal in width. There is curvature of the mid thoracic spine with the convexity towards the left. The observed portions of the rib cage exhibit no acute abnormalities. Old fractures of left lower ribs laterally are demonstrated. These have healed with residual deformity. There is prominent thoracic kyphosis.  IMPRESSION: 1. Increased density in the left infrahilar region suggests subsegmental atelectasis or early interstitial pneumonia. The interstitial markings of both lungs are mildly increased elsewhere. Mild hyperinflation is consistent with COPD. 2. There is no evidence of CHF.   Electronically Signed   By: David  Martinique   On: 05/17/2013 12:33     Assessment/Plan: 1. Community acquired pneumonia- increased mucus production, fever, and chest x-ray findings are suggestive of Community Acquired Pneumonia. We'll treat with Levaquin and bronchodilators in the setting of her COPD. Obtain influenza swab and start Tamiflu empirically given outbreak.  2. COPD with acute exacerbation-secondary to CAP. We'll treat with Xopenex and Atrovent nebulizers. Emphasized importance of maintenance  regimen even if she does not feel that it is helpful- consider Spiriva or home nebulizers. Patient education provided-we'll try to reinforce with patient education materials while inpatient. Follow-up with pulmonology.  3. PAF (paroxysmal atrial fibrillation)- she appears to be in sinus rhythm currently but is at high risk for recurrence of A. fib given her acute pulmonary issues. Monitor on telemetry. Continue  Coumadin.  4. HTN (hypertension)- continue losartan. We'll hold her HCTZ due to relative hypotension and poor by mouth intake  Convert to orals, mobilize   LOS: 2 days   Lue Dubuque A 05/19/2013, 11:14 AM

## 2013-05-20 ENCOUNTER — Inpatient Hospital Stay (HOSPITAL_COMMUNITY): Payer: Medicare Other

## 2013-05-20 LAB — BASIC METABOLIC PANEL
BUN: 10 mg/dL (ref 6–23)
CHLORIDE: 99 meq/L (ref 96–112)
CO2: 28 mEq/L (ref 19–32)
Calcium: 8.4 mg/dL (ref 8.4–10.5)
Creatinine, Ser: 0.62 mg/dL (ref 0.50–1.10)
GFR, EST NON AFRICAN AMERICAN: 81 mL/min — AB (ref >90)
GLUCOSE: 128 mg/dL — AB (ref 70–99)
POTASSIUM: 3.5 meq/L — AB (ref 3.7–5.3)
Sodium: 136 mEq/L — ABNORMAL LOW (ref 137–147)

## 2013-05-20 LAB — CBC
HEMATOCRIT: 36.9 % (ref 36.0–46.0)
HEMOGLOBIN: 12.9 g/dL (ref 12.0–15.0)
MCH: 33.4 pg (ref 26.0–34.0)
MCHC: 35 g/dL (ref 30.0–36.0)
MCV: 95.6 fL (ref 78.0–100.0)
Platelets: 153 10*3/uL (ref 150–400)
RBC: 3.86 MIL/uL — AB (ref 3.87–5.11)
RDW: 14 % (ref 11.5–15.5)
WBC: 2 10*3/uL — ABNORMAL LOW (ref 4.0–10.5)

## 2013-05-20 LAB — PROTIME-INR
INR: 1.91 — AB (ref 0.00–1.49)
Prothrombin Time: 21.3 seconds — ABNORMAL HIGH (ref 11.6–15.2)

## 2013-05-20 MED ORDER — IPRATROPIUM BROMIDE 0.02 % IN SOLN
0.5000 mg | Freq: Four times a day (QID) | RESPIRATORY_TRACT | Status: DC
  Administered 2013-05-21 (×2): 0.5 mg via RESPIRATORY_TRACT
  Filled 2013-05-20 (×2): qty 2.5

## 2013-05-20 MED ORDER — WARFARIN SODIUM 2.5 MG PO TABS
2.5000 mg | ORAL_TABLET | Freq: Once | ORAL | Status: AC
  Administered 2013-05-20: 2.5 mg via ORAL
  Filled 2013-05-20: qty 1

## 2013-05-20 MED ORDER — ALBUTEROL SULFATE (2.5 MG/3ML) 0.083% IN NEBU: 2.5000 mg | INHALATION_SOLUTION | Freq: Four times a day (QID) | RESPIRATORY_TRACT | Status: DC

## 2013-05-20 MED ORDER — LEVALBUTEROL HCL 0.63 MG/3ML IN NEBU
0.6300 mg | INHALATION_SOLUTION | Freq: Four times a day (QID) | RESPIRATORY_TRACT | Status: DC
  Administered 2013-05-21 (×2): 0.63 mg via RESPIRATORY_TRACT
  Filled 2013-05-20 (×7): qty 3

## 2013-05-20 MED ORDER — METHYLPREDNISOLONE SODIUM SUCC 125 MG IJ SOLR
80.0000 mg | Freq: Two times a day (BID) | INTRAMUSCULAR | Status: DC
  Administered 2013-05-20 – 2013-05-23 (×7): 80 mg via INTRAVENOUS
  Filled 2013-05-20 (×8): qty 1.28

## 2013-05-20 NOTE — Progress Notes (Signed)
ANTICOAGULATION CONSULT NOTE - Follow Up Consult  Pharmacy Consult for warfarin  Indication: atrial fibrillation  Allergies  Allergen Reactions  . Ace Inhibitors Cough  . Sulfur Nausea And Vomiting    Patient Measurements: Height: 5\' 5"  (165.1 cm) Weight: 125 lb 7.1 oz (56.9 kg) IBW/kg (Calculated) : 57 Heparin Dosing Weight:   Vital Signs: Temp: 98 F (36.7 C) (02/20 0502) Temp src: Oral (02/20 0502) BP: 130/60 mmHg (02/20 0502) Pulse Rate: 75 (02/20 0502)  Labs:  Recent Labs  05/18/13 0348 05/19/13 0350 05/19/13 0540 05/20/13 0345  HGB 12.4  --  12.7 12.9  HCT 36.2  --  37.1 36.9  PLT 163  --  139* 153  LABPROT 21.1* 18.5*  --  21.3*  INR 1.89* 1.59*  --  1.91*  CREATININE 0.63 0.59  --  0.62    Estimated Creatinine Clearance: 47 ml/min (by C-G formula based on Cr of 0.62).   Assessment: 80 yo F With hx of PAF on chronic anticoagulation with warfarin, presents to ER with COPD exacerbation and CAP. Home warfarin dosing = 2.5mg  daily except 3.75mg  on Monday  Today's INR = 1.91 (SUBtherapeutic) - INR improving with boosted dose of warfarin   CBC: Hgb stable, platelets = 153  Drug Interaction - Levaquin and steroids have potential to elevate INR  Goal of Therapy:  INR 2-3 Monitor platelets by anticoagulation protocol: Yes   Plan:   INR subtherapeutic but improving with higher dose, give warfarin 2.5mg  tonight as expect INR to continue to trend up from 5mg  dose last pm  Daily INR  Watch for potential interaction with antibiotic  Watch platelets  Doreene Eland, PharmD, BCPS.   Pager: [phone] 05/20/2013 11:41 AM

## 2013-05-20 NOTE — Progress Notes (Signed)
Subjective: Patient is clearly not as well today she was yesterday. She is more dyspneic and coughing more. "Not as bright.  Objective: Vital signs in last 24 hours: Temp:  [98 F (36.7 C)-98.5 F (36.9 C)] 98 F (36.7 C) (02/20 0502) Pulse Rate:  [71-75] 75 (02/20 0502) Resp:  [18] 18 (02/20 0502) BP: (105-130)/(49-60) 130/60 mmHg (02/20 0502) SpO2:  [90 %-98 %] 98 % (02/20 0502) Weight change:   CBG (last 3)  No results found for this basename: GLUCAP,  in the last 72 hours  Intake/Output from previous day: 02/19 0701 - 02/20 0700 In: 51 [P.O.:480; I.V.:3] Out: 400 [Urine:400]  Physical Exam: Awake alert looks tired breathless upon speaking No JVD or bruits Heart sounds distant sounds regular 80s no murmur Chest with prolonged expiratory phase decreased breath sounds throughout rales at the left base and end expiratory wheezing which is new Abdomen soft and nontender No peripheral edema intact pulses Neurologic exam is normal   Lab Results:  Recent Labs  05/19/13 0350 05/20/13 0345  NA 137 136*  K 3.5* 3.5*  CL 99 99  CO2 25 28  GLUCOSE 96 128*  BUN 12 10  CREATININE 0.59 0.62  CALCIUM 8.2* 8.4   No results found for this basename: AST, ALT, ALKPHOS, BILITOT, PROT, ALBUMIN,  in the last 72 hours  Recent Labs  05/17/13 1059  05/19/13 0540 05/20/13 0345  WBC 5.5  < > 2.5* 2.0*  NEUTROABS 4.0  --   --   --   HGB 14.9  < > 12.7 12.9  HCT 41.9  < > 37.1 36.9  MCV 95.0  < > 96.4 95.6  PLT 199  < > 139* 153  < > = values in this interval not displayed. Lab Results  Component Value Date   INR 1.91* 05/20/2013   INR 1.59* 05/19/2013   INR 1.89* 05/18/2013   No results found for this basename: CKTOTAL, CKMB, CKMBINDEX, TROPONINI,  in the last 72 hours No results found for this basename: TSH, T4TOTAL, FREET3, T3FREE, THYROIDAB,  in the last 72 hours No results found for this basename: VITAMINB12, FOLATE, FERRITIN, TIBC, IRON, RETICCTPCT,  in the last 72  hours  Studies/Results: No results found.   Assessment/Plan: #1 community-acquired pneumonia declined today appears more bronchospastic we'll continue to oral Levaquin which wishes converted from IV to oral. empiric Tamiflu as well #2 COPD Acute exacerbation will need to add IV steroids at this point #3 paroxysmal 8 her fibrillation currently stable on Coumadin rate controlled #4 essential hypertension stable as well  Add IV steroids hopeful discharge this weekend   LOS: 3 days   Shyloh Krinke A 05/20/2013, 9:31 AM

## 2013-05-21 LAB — BASIC METABOLIC PANEL
BUN: 12 mg/dL (ref 6–23)
CHLORIDE: 99 meq/L (ref 96–112)
CO2: 26 mEq/L (ref 19–32)
Calcium: 8.8 mg/dL (ref 8.4–10.5)
Creatinine, Ser: 0.54 mg/dL (ref 0.50–1.10)
GFR calc Af Amer: >90 mL/min (ref >90)
GFR calc non Af Amer: 84 mL/min — ABNORMAL LOW (ref >90)
Glucose, Bld: 212 mg/dL — ABNORMAL HIGH (ref 70–99)
POTASSIUM: 4.3 meq/L (ref 3.7–5.3)
Sodium: 138 mEq/L (ref 137–147)

## 2013-05-21 LAB — CBC
HEMATOCRIT: 38.3 % (ref 36.0–46.0)
HEMOGLOBIN: 13.3 g/dL (ref 12.0–15.0)
MCH: 32.9 pg (ref 26.0–34.0)
MCHC: 34.7 g/dL (ref 30.0–36.0)
MCV: 94.8 fL (ref 78.0–100.0)
Platelets: 168 10*3/uL (ref 150–400)
RBC: 4.04 MIL/uL (ref 3.87–5.11)
RDW: 13.5 % (ref 11.5–15.5)
WBC: 1 10*3/uL — AB (ref 4.0–10.5)

## 2013-05-21 LAB — PROTIME-INR
INR: 2.21 — AB (ref 0.00–1.49)
Prothrombin Time: 23.8 seconds — ABNORMAL HIGH (ref 11.6–15.2)

## 2013-05-21 MED ORDER — LEVOFLOXACIN 750 MG PO TABS
750.0000 mg | ORAL_TABLET | ORAL | Status: DC
  Filled 2013-05-21: qty 1

## 2013-05-21 MED ORDER — WARFARIN SODIUM 2.5 MG PO TABS
2.5000 mg | ORAL_TABLET | Freq: Once | ORAL | Status: AC
  Administered 2013-05-21: 2.5 mg via ORAL
  Filled 2013-05-21: qty 1

## 2013-05-21 MED ORDER — IPRATROPIUM BROMIDE 0.02 % IN SOLN
0.5000 mg | Freq: Three times a day (TID) | RESPIRATORY_TRACT | Status: DC
  Administered 2013-05-21 – 2013-05-23 (×6): 0.5 mg via RESPIRATORY_TRACT
  Filled 2013-05-21 (×6): qty 2.5

## 2013-05-21 MED ORDER — LEVALBUTEROL HCL 0.63 MG/3ML IN NEBU
0.6300 mg | INHALATION_SOLUTION | Freq: Three times a day (TID) | RESPIRATORY_TRACT | Status: DC
  Administered 2013-05-21 – 2013-05-23 (×6): 0.63 mg via RESPIRATORY_TRACT
  Filled 2013-05-21 (×13): qty 3

## 2013-05-21 NOTE — Progress Notes (Signed)
Subjective: Breathing is feeling better today with less cough. She would like to go home ASAP as her husband also has influenza.  Objective: Vital signs in last 24 hours: Temp:  [97.7 F (36.5 C)-98.4 F (36.9 C)] 97.7 F (36.5 C) (02/21 0625) Pulse Rate:  [67-82] 82 (02/21 0625) Resp:  [20] 20 (02/21 0625) BP: (115-135)/(63-81) 135/81 mmHg (02/21 0625) SpO2:  [92 %-98 %] 97 % (02/21 0625) Weight change:    Intake/Output from previous day: 02/20 0701 - 02/21 0700 In: 600 [P.O.:600] Out: 1275 [Urine:1275]   General appearance: alert, cooperative and no distress Resp: minimal bilateral scattered wheezing with no use of accessory muscles of respiration Cardio: regular rate and rhythm GI: soft, non-tender; bowel sounds normal; no masses,  no organomegaly Extremities: extremities normal, atraumatic, no cyanosis or edema  Lab Results:  Recent Labs  05/20/13 0345 05/21/13 0355  WBC 2.0* 1.0*  HGB 12.9 13.3  HCT 36.9 38.3  PLT 153 168   BMET  Recent Labs  05/20/13 0345 05/21/13 0355  NA 136* 138  K 3.5* 4.3  CL 99 99  CO2 28 26  GLUCOSE 128* 212*  BUN 10 12  CREATININE 0.62 0.54  CALCIUM 8.4 8.8   CMET CMP     Component Value Date/Time   NA 138 05/21/2013 0355   K 4.3 05/21/2013 0355   CL 99 05/21/2013 0355   CO2 26 05/21/2013 0355   GLUCOSE 212* 05/21/2013 0355   BUN 12 05/21/2013 0355   CREATININE 0.54 05/21/2013 0355   CALCIUM 8.8 05/21/2013 0355   PROT 6.4 12/29/2006 1730   ALBUMIN 4.0 12/29/2006 1730   AST 24 12/29/2006 1730   ALT 18 12/29/2006 1730   ALKPHOS 113 12/29/2006 1730   BILITOT 1.3* 12/29/2006 1730   GFRNONAA 84* 05/21/2013 0355   GFRAA >90 05/21/2013 0355    CBG (last 3)  No results found for this basename: GLUCAP,  in the last 72 hours  INR RESULTS:   Lab Results  Component Value Date   INR 2.21* 05/21/2013   INR 1.91* 05/20/2013   INR 1.59* 05/19/2013     Studies/Results: Dg Chest 2 View  05/20/2013   CLINICAL DATA:  Cough and  congestion, followup pneumonia  EXAM: CHEST  2 VIEW  COMPARISON:  05/17/2013  FINDINGS: Cardiac shadow is stable. The lungs are again hyperinflated. Mild patchy changes are again seen in the left lung base but mildly improved. No new focal infiltrate is seen. No acute bony abnormality is noted.  IMPRESSION: Slight improved aeration in the left base.   Electronically Signed   By: Inez Catalina M.D.   On: 05/20/2013 10:24    Medications: I have reviewed the patient's current medications.  Assessment/Plan: #1 Acute Influenza: slowly improving and review of initial CXR with normal WBC count shows little evidence for pneumonia. She did have a nasal test positive for influenza type B.  #2 Leukopenia: likely from acute influenza and less likely from meds. Will request opinion from pharmacist regarding drugs that could be causing leukopenia. Will request CBC with smear in the AM #3 COPD exacerbation: improved with corticosteroids.   LOS: 4 days   Simpson Paulos G 05/21/2013, 8:18 AM

## 2013-05-21 NOTE — Progress Notes (Signed)
Pt off oxygen when sitting in chair or lying in bed. O2 sats 93-96%. When ambulating in hallway, O2 sats dropped to 84%. Will continue to monitor sats with activity. ?home O2.

## 2013-05-21 NOTE — Progress Notes (Signed)
ANTICOAGULATION CONSULT NOTE - Follow Up Consult  Pharmacy Consult for warfarin  Indication: atrial fibrillation  Allergies  Allergen Reactions  . Ace Inhibitors Cough  . Sulfur Nausea And Vomiting    Patient Measurements: Height: 5\' 5"  (165.1 cm) Weight: 125 lb 7.1 oz (56.9 kg) IBW/kg (Calculated) : 57  Vital Signs: Temp: 97.7 F (36.5 C) (02/21 0625) Temp src: Oral (02/21 0625) BP: 135/81 mmHg (02/21 0625) Pulse Rate: 82 (02/21 0625)  Labs:  Recent Labs  05/19/13 0350  05/19/13 0540 05/20/13 0345 05/21/13 0355  HGB  --   < > 12.7 12.9 13.3  HCT  --   --  37.1 36.9 38.3  PLT  --   --  139* 153 168  LABPROT 18.5*  --   --  21.3* 23.8*  INR 1.59*  --   --  1.91* 2.21*  CREATININE 0.59  --   --  0.62 0.54  < > = values in this interval not displayed.  Estimated Creatinine Clearance: 47 ml/min (by C-G formula based on Cr of 0.54).  Inpatient warfarin doses 2/17-2/20: 3.75, 4, 5, 2.5mg   Assessment: 80 yo F With hx of PAF on chronic anticoagulation with warfarin, presents to ER with COPD exacerbation and CAP. Home warfarin dosing = 2.5mg  daily except 3.75mg  on Monday  INR now therapeutic after boosted dose of warfarin since admit   CBC stable, no bleeding/complications reported.  Drug Interaction - Levaquin and steroids have potential to elevate INR  Goal of Therapy:  INR 2-3 Monitor platelets by anticoagulation protocol: Yes   Plan:   Warfarin 2.5mg  today per home regimen  Daily INR  Watch for potential interaction with antibiotic  Asked to comment on potential drug causes of leukopenia. Only new medications since admit are levaquin and tamiflu. Levaquin has <1% incidence of agranulocytosis; Tamiflu does not have a reported incidence of CBC abnormalities.  Peggyann Juba, PharmD, BCPS Pager: [phone]  05/21/2013 8:28 AM

## 2013-05-21 NOTE — Progress Notes (Signed)
CRITICAL VALUE ALERT  Critical value received:  WBC 1.0  Date of notification:  05/21/13  Time of notification:  0500  Critical value read back:yes  Nurse who received alert:  M. Maili Shutters RN  MD notified (1st page):  On call- Reynaldo Minium  Time of first page:  0511  MD notified (2nd page):  Time of second page:  Responding MD:  Sharlett Iles  Time MD responded:  [phone]

## 2013-05-22 LAB — CBC WITH DIFFERENTIAL/PLATELET
Basophils Absolute: 0 10*3/uL (ref 0.0–0.1)
Basophils Relative: 0 % (ref 0–1)
EOS PCT: 0 % (ref 0–5)
Eosinophils Absolute: 0 10*3/uL (ref 0.0–0.7)
HCT: 37.4 % (ref 36.0–46.0)
Hemoglobin: 13 g/dL (ref 12.0–15.0)
LYMPHS ABS: 0.5 10*3/uL — AB (ref 0.7–4.0)
LYMPHS PCT: 12 % (ref 12–46)
MCH: 32.7 pg (ref 26.0–34.0)
MCHC: 34.8 g/dL (ref 30.0–36.0)
MCV: 94 fL (ref 78.0–100.0)
MONOS PCT: 3 % (ref 3–12)
Monocytes Absolute: 0.1 10*3/uL (ref 0.1–1.0)
NEUTROS PCT: 85 % — AB (ref 43–77)
Neutro Abs: 3.7 10*3/uL (ref 1.7–7.7)
PLATELETS: 196 10*3/uL (ref 150–400)
RBC: 3.98 MIL/uL (ref 3.87–5.11)
RDW: 13.2 % (ref 11.5–15.5)
WBC: 4.4 10*3/uL (ref 4.0–10.5)

## 2013-05-22 LAB — BASIC METABOLIC PANEL
BUN: 19 mg/dL (ref 6–23)
CO2: 28 mEq/L (ref 19–32)
Calcium: 8.9 mg/dL (ref 8.4–10.5)
Chloride: 98 mEq/L (ref 96–112)
Creatinine, Ser: 0.65 mg/dL (ref 0.50–1.10)
GFR calc non Af Amer: 79 mL/min — ABNORMAL LOW (ref >90)
GLUCOSE: 193 mg/dL — AB (ref 70–99)
POTASSIUM: 3.7 meq/L (ref 3.7–5.3)
Sodium: 138 mEq/L (ref 137–147)

## 2013-05-22 LAB — PROTIME-INR
INR: 2.94 — ABNORMAL HIGH (ref 0.00–1.49)
Prothrombin Time: 29.6 seconds — ABNORMAL HIGH (ref 11.6–15.2)

## 2013-05-22 MED ORDER — WARFARIN SODIUM 1 MG PO TABS
1.0000 mg | ORAL_TABLET | Freq: Once | ORAL | Status: AC
  Administered 2013-05-22: 1 mg via ORAL
  Filled 2013-05-22: qty 1

## 2013-05-22 NOTE — Progress Notes (Signed)
Subjective: Dyspnea is improving although she had pulse oxygen desaturation with ambulating yesterday. Feels a bit stronger  Objective: Vital signs in last 24 hours: Temp:  [97.5 F (36.4 C)-98.2 F (36.8 C)] 97.5 F (36.4 C) (02/22 0628) Pulse Rate:  [78-84] 80 (02/22 0628) Resp:  [19-20] 19 (02/22 0628) BP: (126-147)/(63-78) 147/71 mmHg (02/22 0628) SpO2:  [79 %-98 %] 90 % (02/22 0628) Weight change:    Intake/Output from previous day: 02/21 0701 - 02/22 0700 In: 120 [P.O.:120] Out: 650 [Urine:650]   General appearance: alert, cooperative and no distress Resp: clear to auscultation bilaterally and with hyperexpanded lungs Cardio: regular rate and rhythm, S1, S2 normal, no murmur, click, rub or gallop GI: soft, non-tender; bowel sounds normal; no masses,  no organomegaly Extremities: extremities normal, atraumatic, no cyanosis or edema  Lab Results:  Recent Labs  05/21/13 0355 05/22/13 0403  WBC 1.0* 4.4  HGB 13.3 13.0  HCT 38.3 37.4  PLT 168 196   BMET  Recent Labs  05/21/13 0355 05/22/13 0403  NA 138 138  K 4.3 3.7  CL 99 98  CO2 26 28  GLUCOSE 212* 193*  BUN 12 19  CREATININE 0.54 0.65  CALCIUM 8.8 8.9   CMET CMP     Component Value Date/Time   NA 138 05/22/2013 0403   K 3.7 05/22/2013 0403   CL 98 05/22/2013 0403   CO2 28 05/22/2013 0403   GLUCOSE 193* 05/22/2013 0403   BUN 19 05/22/2013 0403   CREATININE 0.65 05/22/2013 0403   CALCIUM 8.9 05/22/2013 0403   PROT 6.4 12/29/2006 1730   ALBUMIN 4.0 12/29/2006 1730   AST 24 12/29/2006 1730   ALT 18 12/29/2006 1730   ALKPHOS 113 12/29/2006 1730   BILITOT 1.3* 12/29/2006 1730   GFRNONAA 79* 05/22/2013 0403   GFRAA >90 05/22/2013 0403    CBG (last 3)  No results found for this basename: GLUCAP,  in the last 72 hours  INR RESULTS:   Lab Results  Component Value Date   INR 2.94* 05/22/2013   INR 2.21* 05/21/2013   INR 1.91* 05/20/2013     Studies/Results: Dg Chest 2 View  05/20/2013   CLINICAL DATA:   Cough and congestion, followup pneumonia  EXAM: CHEST  2 VIEW  COMPARISON:  05/17/2013  FINDINGS: Cardiac shadow is stable. The lungs are again hyperinflated. Mild patchy changes are again seen in the left lung base but mildly improved. No new focal infiltrate is seen. No acute bony abnormality is noted.  IMPRESSION: Slight improved aeration in the left base.   Electronically Signed   By: Inez Catalina M.D.   On: 05/20/2013 10:24    Medications: I have reviewed the patient's current medications.  Assessment/Plan: #1 Acute Influenza type B:  Improved with tamiflu #2 Pneumonia:  There is no clear infiltrate on CXR and WBC count is no longer low, so will discontinue levaquin. #3 Dyspnea: improving, unclear if she will need home oxygen, so will continue high dose nebs and IV steroids, recheck oxygen sats, likely discharge to home tomorrow.    LOS: 5 days   Hunt Zajicek G 05/22/2013, 7:57 AM

## 2013-05-22 NOTE — Progress Notes (Signed)
ANTICOAGULATION CONSULT NOTE - Follow Up Consult  Pharmacy Consult for warfarin  Indication: atrial fibrillation  Allergies  Allergen Reactions  . Ace Inhibitors Cough  . Sulfur Nausea And Vomiting    Patient Measurements: Height: 5\' 5"  (165.1 cm) Weight: 125 lb 7.1 oz (56.9 kg) IBW/kg (Calculated) : 57  Vital Signs: Temp: 97.5 F (36.4 C) (02/22 0628) Temp src: Oral (02/22 0628) BP: 147/71 mmHg (02/22 0628) Pulse Rate: 80 (02/22 0628)  Labs:  Recent Labs  05/20/13 0345 05/21/13 0355 05/22/13 0403  HGB 12.9 13.3 13.0  HCT 36.9 38.3 37.4  PLT 153 168 196  LABPROT 21.3* 23.8* 29.6*  INR 1.91* 2.21* 2.94*  CREATININE 0.62 0.54 0.65    Estimated Creatinine Clearance: 47 ml/min (by C-G formula based on Cr of 0.65).  Inpatient warfarin doses 2/17-2/20: 3.75, 4, 5, 2.5mg   Assessment: 80 yo F With hx of PAF on chronic anticoagulation with warfarin, presents to ER with COPD exacerbation and CAP. Home warfarin dosing = 2.5mg  daily except 3.75mg  on Monday  INR now therapeutic and rising after boosted dose on admit and concurrent levaquin   CBC stable, no bleeding/complications reported.  Drug Interaction - Levaquin and steroids have potential to elevate INR  Goal of Therapy:  INR 2-3 Monitor platelets by anticoagulation protocol: Yes   Plan:   Decrease Warfarin 1mg  today to prevent further rise  Daily INR   Peggyann Juba, PharmD, BCPS Pager: [phone]  05/22/2013 9:15 AM

## 2013-05-22 NOTE — Progress Notes (Signed)
Patient ambulated with NT. Oxygen saturation ranged from 83-87% on room air when ambulating. At rest patient's oxygen saturation is  94% on room air. No complaints of shortness of breath and no s/s of acute respiratory distress when ambulating. Will continue to monitor.

## 2013-05-22 NOTE — Progress Notes (Signed)
MD-- please note the big difference in patients oxygen saturation when ambulating, slowly, with front wheel walker and standby assist at 76-84% on room air -- and jumps right back to 98% when patient sits down in chair. Pt does not complain of shortness of breath when ambulating and has no s/s of acute distress.

## 2013-05-22 NOTE — Progress Notes (Signed)
Assessed pt O2 sats while sleeping. Measured 92-94% on room air. Patient resting comfortably. Will continue to monitor. Blanchard Kelch, RN

## 2013-05-23 LAB — BASIC METABOLIC PANEL
BUN: 20 mg/dL (ref 6–23)
CO2: 25 meq/L (ref 19–32)
Calcium: 8.7 mg/dL (ref 8.4–10.5)
Chloride: 100 mEq/L (ref 96–112)
Creatinine, Ser: 0.46 mg/dL — ABNORMAL LOW (ref 0.50–1.10)
GFR calc Af Amer: >90 mL/min (ref >90)
GFR calc non Af Amer: 89 mL/min — ABNORMAL LOW (ref >90)
GLUCOSE: 190 mg/dL — AB (ref 70–99)
POTASSIUM: 3.9 meq/L (ref 3.7–5.3)
SODIUM: 139 meq/L (ref 137–147)

## 2013-05-23 LAB — PROTIME-INR
INR: 3.21 — ABNORMAL HIGH (ref 0.00–1.49)
Prothrombin Time: 31.7 seconds — ABNORMAL HIGH (ref 11.6–15.2)

## 2013-05-23 LAB — CBC
HEMATOCRIT: 39.5 % (ref 36.0–46.0)
HEMOGLOBIN: 13.7 g/dL (ref 12.0–15.0)
MCH: 32.9 pg (ref 26.0–34.0)
MCHC: 34.7 g/dL (ref 30.0–36.0)
MCV: 94.7 fL (ref 78.0–100.0)
Platelets: 208 10*3/uL (ref 150–400)
RBC: 4.17 MIL/uL (ref 3.87–5.11)
RDW: 13.3 % (ref 11.5–15.5)
WBC: 7 10*3/uL (ref 4.0–10.5)

## 2013-05-23 MED ORDER — LEVOFLOXACIN 750 MG PO TABS: 750.0000 mg | ORAL_TABLET | Freq: Every day | ORAL | Status: DC

## 2013-05-23 MED ORDER — PREDNISONE (PAK) 10 MG PO TABS: ORAL_TABLET | Freq: Every day | ORAL | Status: DC

## 2013-05-23 MED ORDER — ALBUTEROL SULFATE HFA 108 (90 BASE) MCG/ACT IN AERS: 2.0000 | INHALATION_SPRAY | Freq: Four times a day (QID) | RESPIRATORY_TRACT | Status: DC | PRN

## 2013-05-23 NOTE — Discharge Summary (Signed)
DISCHARGE SUMMARY  Heather [name]  MR#: [id number]  DOB:Aug 03, 1926  Date of Admission: 05/17/2013 Date of Discharge: 05/23/2013  Attending Physician:Maxfield Gildersleeve A  Patient's HKV:[id number],OVFIEPP A, MD  Consults:  none  Discharge Diagnoses: Active Problems:   PAF (paroxysmal atrial fibrillation)   HTN (hypertension)   Community acquired pneumonia   COPD with acute exacerbation   Discharge Medications:   Medication List         albuterol 108 (90 BASE) MCG/ACT inhaler  Commonly known as:  PROVENTIL HFA;VENTOLIN HFA  Inhale 2 puffs into the lungs every 6 (six) hours as needed for wheezing or shortness of breath.     calcium citrate-vitamin D 315-200 MG-UNIT per tablet  Commonly known as:  CITRACAL+D  Take 1 tablet by mouth daily.     COZAAR 100 MG tablet  Generic drug:  losartan  Take 100 mg by mouth daily.     diltiazem 180 MG 24 hr capsule  Commonly known as:  CARDIZEM CD  Take 180 mg by mouth daily.     hydrochlorothiazide 25 MG tablet  Commonly known as:  HYDRODIURIL  Take 25 mg by mouth daily.     levofloxacin 750 MG tablet  Commonly known as:  LEVAQUIN  Take 1 tablet (750 mg total) by mouth daily.     levothyroxine 137 MCG tablet  Commonly known as:  SYNTHROID, LEVOTHROID  Take half tablet on Monday, Wednesday, and Friday. Take 1 tablet all other days     multivitamin with minerals tablet  Take 1 tablet by mouth daily.     predniSONE 10 MG tablet  Commonly known as:  STERAPRED UNI-PAK  Take by mouth daily. 4 daily 2 days, 3 daily 2 days, 2 daily 3 days, 1 daily 3 days, d/c     warfarin 2.5 MG tablet  Commonly known as:  COUMADIN  Take 2.5-3.75 mg by mouth daily. Take 1.5  Tablets (3.75 mg) on Monday and 1 tablet (2.5 mg) on Tuesday, Wednesday, Thursday, Friday, Saturday, and Sunday.        Hospital Procedures: Dg Chest 2 View  05/20/2013   CLINICAL DATA:  Cough and congestion, followup pneumonia  EXAM: CHEST  2 VIEW  COMPARISON:  05/17/2013   FINDINGS: Cardiac shadow is stable. The lungs are again hyperinflated. Mild patchy changes are again seen in the left lung base but mildly improved. No new focal infiltrate is seen. No acute bony abnormality is noted.  IMPRESSION: Slight improved aeration in the left base.   Electronically Signed   By: Inez Catalina M.D.   On: 05/20/2013 10:24   Dg Chest 2 View  05/17/2013   CLINICAL DATA:  Five day history of productive cough. ; history of atrial fibrillation, COPD, and previous smoking history  EXAM: CHEST  2 VIEW  COMPARISON:  CT ABD/PELV WO CM dated 01/16/2013; DG RIBS UNILATERAL W/CHEST*L* dated 01/16/2013  FINDINGS: The lungs are mildly hyperinflated. There is no focal infiltrate. The interstitial markings are increased in the left infrahilar region consistent with subsegmental atelectasis. The cardiac silhouette is normal in size. The pulmonary vascularity is not engorged. The mediastinum is normal in width. There is curvature of the mid thoracic spine with the convexity towards the left. The observed portions of the rib cage exhibit no acute abnormalities. Old fractures of left lower ribs laterally are demonstrated. These have healed with residual deformity. There is prominent thoracic kyphosis.  IMPRESSION: 1. Increased density in the left infrahilar region suggests subsegmental atelectasis or early interstitial pneumonia.  The interstitial markings of both lungs are mildly increased elsewhere. Mild hyperinflation is consistent with COPD. 2. There is no evidence of CHF.   Electronically Signed   By: David  Martinique   On: 05/17/2013 12:33    History of Present Illness:  Heather [name] is an 80 year old white female with a history of COPD, paroxysmal atrial fibrillation on anticoagulation, and chronic dyspnea on exertion who presented to the emergency department with the complaint of cough and worsening shortness of breath. Patient has a history of COPD as diagnosed on prior cardiopulmonary stress test/PFTs.  She reports asubacute increase in dyspnea on exertion over the past 6 months to one year. She has seen pulmonology recently and was scheduled for followup PFTs. She's tried Symbicort and Advair without improvement so she stopped these medications. Over the past 3 days she's had an acute change in her condition with increasing sore throat, cough, congestion, and sputum production with yellow green sputum. She fever up to around 101 2 days ago. She thought she was feeling a little better yesterday but today awoke with increased symptoms and generalized malaise/fatigue prompting her to come to the emergency department. In the emergency department she is found have a temperature of 101.5, oxygen saturation 85% on room air, and chest x-ray suggestive of left perihilar pneumonia. She is being admitted for further management.   Hospital Course: Patient was omitted to the medical service placed empirically on Tamiflu for influenza-like illness for 5 days as well as IV antibiotics. Ultimately all improving some initially she had an increase in wheezing and symptoms compatible with a COPD exacerbation. IV steroids and bronchodilators were added to the regimen and she improved dramatically weaning off of oxygen and ambulating without difficulty. He went take remains excellent. She would be discharged home at this point fully independent in her ADLs with a normal pulmonary exam. She'll followup as an outpatient.  Day of Discharge Exam BP 139/83  Pulse 75  Temp(Src) 97.2 F (36.2 C) (Oral)  Resp 21  Ht 5\' 5"  (1.651 m)  Wt 56.9 kg (125 lb 7.1 oz)  BMI 20.87 kg/m2  SpO2 96%  Physical Exam: General appearance: alert, cooperative and no distress Eyes: no scleral icterus Throat: oropharynx moist without erythema Resp: clear to auscultation bilaterally Cardio: regular rate and rhythm Extremities: no clubbing, cyanosis or edema  Discharge Labs:  Recent Labs  05/22/13 0403 05/23/13 0420  NA 138 139  K 3.7  3.9  CL 98 100  CO2 28 25  GLUCOSE 193* 190*  BUN 19 20  CREATININE 0.65 0.46*  CALCIUM 8.9 8.7   No results found for this basename: AST, ALT, ALKPHOS, BILITOT, PROT, ALBUMIN,  in the last 72 hours  Recent Labs  05/22/13 0403 05/23/13 0420  WBC 4.4 7.0  NEUTROABS 3.7  --   HGB 13.0 13.7  HCT 37.4 39.5  MCV 94.0 94.7  PLT 196 208   No results found for this basename: CKTOTAL, CKMB, CKMBINDEX, TROPONINI,  in the last 72 hours No results found for this basename: TSH, T4TOTAL, FREET3, T3FREE, THYROIDAB,  in the last 72 hours No results found for this basename: VITAMINB12, FOLATE, FERRITIN, TIBC, IRON, RETICCTPCT,  in the last 72 hours  Discharge instructions:     Discharge Orders   Future Appointments Provider Department Dept Phone   05/31/2013 1:00 PM [facility] [phone]   05/31/2013 2:00 PM Collene Gobble, MD [facility] [phone]   06/10/2013 4:15 PM Nadean Corwin.  Hilty, MD [facility] [phone]   Future Orders Complete By Expires   Diet - low sodium heart healthy  As directed    Increase activity slowly  As directed       Disposition: Home  Follow-up Appts: Follow-up with Dr. Reynaldo Minium at [facility] in 1 week.  Call for appointment.  Condition on Discharge: Improved and stable condition  Tests Needing Follow-up: None  Signed: Esly Selvage A 05/23/2013, 1:12 PM

## 2013-05-23 NOTE — Progress Notes (Signed)
ANTICOAGULATION CONSULT NOTE - Follow Up Consult  Pharmacy Consult for warfarin  Indication: atrial fibrillation  Allergies  Allergen Reactions  . Ace Inhibitors Cough  . Sulfur Nausea And Vomiting    Patient Measurements: Height: 5\' 5"  (165.1 cm) Weight: 125 lb 7.1 oz (56.9 kg) IBW/kg (Calculated) : 57  Vital Signs: Temp: 97.2 F (36.2 C) (02/23 0637) Temp src: Oral (02/23 0637) BP: 139/83 mmHg (02/23 0650) Pulse Rate: 75 (02/23 0637)  Labs:  Recent Labs  05/21/13 0355 05/22/13 0403 05/23/13 0420  HGB 13.3 13.0 13.7  HCT 38.3 37.4 39.5  PLT 168 196 208  LABPROT 23.8* 29.6* 31.7*  INR 2.21* 2.94* 3.21*  CREATININE 0.54 0.65 0.46*    Estimated Creatinine Clearance: 47 ml/min (by C-G formula based on Cr of 0.46).  Inpatient warfarin doses 2/17-2/20: 3.75, 4, 5, 2.5, 1mg   Assessment: 80 yo F With hx of PAF on chronic anticoagulation with warfarin, presents to ER with COPD exacerbation and CAP. Home warfarin dosing = 2.5mg  daily except 3.75mg  on Monday  INR now supratherapeutic and rising after boosted dose on admit and concurrent levaquin   CBC stable, no bleeding/complications reported.  Drug Interaction - Levaquin now stopped as of 2/22  Goal of Therapy:  INR 2-3 Monitor platelets by anticoagulation protocol: Yes   Plan:  1) NO warfarin today due to INR > 3 and continued rise in INR 2) Daily INR   Adrian Saran, PharmD, BCPS Pager [phone] 05/23/2013 12:26 PM

## 2013-05-23 NOTE — Progress Notes (Signed)
Patient ambulated on room air and oxygen saturation dropped to lowest of 88% with PPE mask on.  Patient had no c/o SOB or other difficulty breathing.  Will proceed with discharge as ordered.

## 2013-05-29 ENCOUNTER — Other Ambulatory Visit: Payer: Self-pay | Admitting: Internal Medicine

## 2013-05-30 ENCOUNTER — Other Ambulatory Visit: Payer: Self-pay | Admitting: Emergency Medicine

## 2013-05-30 DIAGNOSIS — R0602 Shortness of breath: Secondary | ICD-10-CM

## 2013-05-30 NOTE — Telephone Encounter (Signed)
Pt was calling in regards to her warfarin not being called in yet to the pharmacy.   [place]

## 2013-05-31 ENCOUNTER — Ambulatory Visit (INDEPENDENT_AMBULATORY_CARE_PROVIDER_SITE_OTHER): Payer: Medicare Other | Admitting: Emergency Medicine

## 2013-05-31 ENCOUNTER — Encounter: Payer: Self-pay | Admitting: Emergency Medicine

## 2013-05-31 VITALS — BP 112/70 | HR 83 | Ht 63.0 in | Wt 116.0 lb

## 2013-05-31 DIAGNOSIS — R0602 Shortness of breath: Secondary | ICD-10-CM

## 2013-05-31 DIAGNOSIS — J449 Chronic obstructive pulmonary disease, unspecified: Secondary | ICD-10-CM

## 2013-05-31 LAB — PULMONARY FUNCTION TEST
DL/VA % pred: 75 %
DL/VA: 3.54 ml/min/mmHg/L
DLCO unc % pred: 66 %
DLCO unc: 15.19 ml/min/mmHg
FEF 25-75 POST: 0.51 L/s
FEF 25-75 PRE: 0.39 L/s
FEF2575-%Change-Post: 30 %
FEF2575-%Pred-Post: 45 %
FEF2575-%Pred-Pre: 34 %
FEV1-%Change-Post: 8 %
FEV1-%Pred-Post: 63 %
FEV1-%Pred-Pre: 58 %
FEV1-PRE: 1 L
FEV1-Post: 1.08 L
FEV1FVC-%Change-Post: 5 %
FEV1FVC-%Pred-Pre: 65 %
FEV6-%Change-Post: 5 %
FEV6-%PRED-POST: 96 %
FEV6-%Pred-Pre: 91 %
FEV6-POST: 2.09 L
FEV6-Pre: 1.97 L
FEV6FVC-%CHANGE-POST: 3 %
FEV6FVC-%PRED-PRE: 99 %
FEV6FVC-%Pred-Post: 103 %
FVC-%CHANGE-POST: 2 %
FVC-%PRED-POST: 93 %
FVC-%PRED-PRE: 91 %
FVC-POST: 2.15 L
FVC-Pre: 2.1 L
PRE FEV1/FVC RATIO: 48 %
Post FEV1/FVC ratio: 50 %
Post FEV6/FVC ratio: 97 %
Pre FEV6/FVC Ratio: 94 %

## 2013-05-31 NOTE — Progress Notes (Signed)
   Subjective:    Patient ID: Heather [name], female    DOB: [birth year], 80 y.o.   MRN: [id number]  HPI 80 yo former smoker, hx of HTN, A Fib, GERD, HH, hypothyroidism, progressive dyspnea for 3 years that has worsened significantly over last 6 months. She also hears a wheeze, happens with exertion. She is followed by Dr Ferne Reus who discovered that she was in A fib around the time her dyspnea was first evaluated. She underwent CPST in 2/14 that suggested a resp limitation with severe AFL. Her cardiac stress test in 2012 was normal. She has been tried on Symbicort and Advair without benefit. Her thyroid testing has been good.   ROV 05/31/13 -- follows up for dyspnea and wheezing with an evaluation as above. She returns today after PFT > severe AFL, no BD response, decreased diffusion capacity. She is using proventil bid and it seems to be helping her.    Review of Systems  Constitutional: Positive for unexpected weight change. Negative for fever and chills.  HENT: Positive for rhinorrhea and trouble swallowing. Negative for congestion, dental problem, ear pain, nosebleeds, postnasal drip, sinus pressure, sneezing, sore throat and voice change.   Eyes: Negative for visual disturbance.  Respiratory: Positive for shortness of breath. Negative for cough and choking.   Cardiovascular: Positive for leg swelling. Negative for chest pain.  Gastrointestinal: Positive for nausea and abdominal pain. Negative for vomiting and diarrhea.  Genitourinary: Negative for difficulty urinating.  Musculoskeletal: Negative for arthralgias.  Skin: Negative for rash.  Neurological: Negative for tremors, syncope and headaches.  Hematological: Does not bruise/bleed easily.         Objective:   Physical Exam Filed Vitals:   05/31/13 1416  BP: 112/70  Pulse: 83  Height: 5\' 3"  (1.6 m)  Weight: 116 lb (52.617 kg)  SpO2: 96%   Gen: Pleasant, thin woman, in no distress,  normal affect, some kyphosis  ENT: No  lesions,  mouth clear,  oropharynx clear, no postnasal drip  Neck: No JVD, no TMG, no carotid bruits  Lungs: No use of accessory muscles, distant, clear without rales or rhonchi. Wheeze on a forced expiration   Cardiovascular: RRR with a rare skipped beat, heart sounds normal, no murmur or gallops, no peripheral edema  Musculoskeletal: No deformities, no cyanosis or clubbing  Neuro: alert, non focal  Skin: Warm, no lesions or rashes      Assessment & Plan:  COPD (chronic obstructive pulmonary disease) Moderate to severe AFL on spirometry. Etiology could be tobacco but her usage was minimal. ? possible fixed asthma. The treatment would be similar for either. She has not responded to symbicort or advair. Could try Anoro but suspect this will be more expensive. I would start Spiriva now and follow her status. Will continue proventil prn. rov 1 to assess improvement

## 2013-05-31 NOTE — Assessment & Plan Note (Signed)
Moderate to severe AFL on spirometry. Etiology could be tobacco but her usage was minimal. ? possible fixed asthma. The treatment would be similar for either. She has not responded to symbicort or advair. Could try Anoro but suspect this will be more expensive. I would start Spiriva now and follow her status. Will continue proventil prn. rov 1 to assess improvement

## 2013-05-31 NOTE — Progress Notes (Signed)
PFT done today. 

## 2013-06-10 ENCOUNTER — Ambulatory Visit (INDEPENDENT_AMBULATORY_CARE_PROVIDER_SITE_OTHER): Payer: Medicare Other | Admitting: Internal Medicine

## 2013-06-10 ENCOUNTER — Encounter: Payer: Self-pay | Admitting: Internal Medicine

## 2013-06-10 ENCOUNTER — Ambulatory Visit (INDEPENDENT_AMBULATORY_CARE_PROVIDER_SITE_OTHER): Payer: Medicare Other | Admitting: Pharmacist Clinician (PhC)/ Clinical Pharmacy Specialist

## 2013-06-10 VITALS — BP 150/88 | HR 91 | Ht 63.0 in | Wt 117.8 lb

## 2013-06-10 DIAGNOSIS — I4891 Unspecified atrial fibrillation: Secondary | ICD-10-CM

## 2013-06-10 DIAGNOSIS — I10 Essential (primary) hypertension: Secondary | ICD-10-CM

## 2013-06-10 DIAGNOSIS — I480 Paroxysmal atrial fibrillation: Secondary | ICD-10-CM

## 2013-06-10 DIAGNOSIS — Z7901 Long term (current) use of anticoagulants: Secondary | ICD-10-CM

## 2013-06-10 DIAGNOSIS — J449 Chronic obstructive pulmonary disease, unspecified: Secondary | ICD-10-CM

## 2013-06-10 LAB — POCT INR: INR: 3.3

## 2013-06-10 NOTE — Patient Instructions (Signed)
Dr Debara Pickett wants you to follow-up in 6 months. You will receive a reminder letter in the mail two months in advance. If you don't receive a letter, please call our office to schedule the follow-up appointment.

## 2013-06-10 NOTE — Progress Notes (Signed)
06/10/2013   Heather [name]   1926-12-07  [id number]  Primary Physicia ARONSON,RICHARD A, MD Primary Cardiologist: Dr Debara Pickett  HPI:  Heather [name] 80 y/o followed by Dr Debara Pickett with a history of PAF. She recently had a MET test that indicated Emphysema although she has no history of smoking. She is on chronic Coumadin. Her B/P has been controlled by her readings at home and our readings here by our pharmacist. Echo in Feb 2013 showed an EF of >55%, NL LA size, and NL RVF. She had an episode of weakness and palpitations 11/13/12 pm. EMS was contacted and she was taken to [facility]. She received Diltiazem en route and was in NSR when she arrived at [facility]. They suggested she increase her Diltiazem to 240 mg daily but she was hesitant to do this.  Heather [name] returns today for followup of her recent hospitalization in February. She probably was in the hospital for one week with pneumonia/flu. This is on top of her underlying COPD. She has recovered and is now on Spiriva and has plans for followup with Dr. Lamonte Sakai. She feels her breathing has improved significantly. She denies any further palpitations   Current Outpatient Prescriptions  Medication Sig Dispense Refill  . albuterol (PROVENTIL HFA;VENTOLIN HFA) 108 (90 BASE) MCG/ACT inhaler Inhale 2 puffs into the lungs every 6 (six) hours as needed for wheezing or shortness of breath.  1 Inhaler  2  . calcium citrate-vitamin D (CITRACAL+D) 315-200 MG-UNIT per tablet Take 1 tablet by mouth daily.      Marland Kitchen diltiazem (CARDIZEM CD) 180 MG 24 hr capsule Take 180 mg by mouth daily.      . hydrochlorothiazide (HYDRODIURIL) 25 MG tablet Take 25 mg by mouth daily.      Marland Kitchen levothyroxine (SYNTHROID, LEVOTHROID) 137 MCG tablet Take half tablet on Monday, Wednesday, and Friday. Take 1 tablet all other days      . losartan (COZAAR) 100 MG tablet Take 100 mg by mouth daily.      . Multiple Vitamins-Minerals (MULTIVITAMIN WITH MINERALS) tablet Take 1 tablet by mouth daily.      Marland Kitchen tiotropium  (SPIRIVA) 18 MCG inhalation capsule Place 18 mcg into inhaler and inhale daily.      Marland Kitchen warfarin (COUMADIN) 2.5 MG tablet Take 1 to 1 & 1/2 tablets by mouth daily as directed  45 tablet  5   No current facility-administered medications for this visit.    Allergies  Allergen Reactions  . Ace Inhibitors Cough  . Sulfur Nausea And Vomiting    History   Social History  . Marital Status: Married    Spouse Name: N/A    Number of Children: 2  . Years of Education: N/A   Occupational History  . Retired Astronomer    Social History Main Topics  . Smoking status: Former Smoker -- 1.00 packs/day for 20 years    Types: Cigarettes    Quit date: 09/11/1988  . Smokeless tobacco: Never Used  . Alcohol Use: No  . Drug Use: No  . Sexual Activity: Yes   Other Topics Concern  . Not on file   Social History Narrative  . No narrative on file     Review of Systems: General: negative for chills, fever, night sweats or weight changes.  Cardiovascular: negative for chest pain, dyspnea on exertion, edema, orthopnea, palpitations, paroxysmal nocturnal dyspnea or shortness of breath Dermatological: negative for rash Respiratory: negative for cough or wheezing Urologic: negative for hematuria Abdominal: negative for nausea, vomiting,  diarrhea, bright red blood per rectum, melena, or hematemesis Neurologic: negative for visual changes, syncope, or dizziness All other systems reviewed and are otherwise negative except as noted above.    Blood pressure 150/88, pulse 91, height _0  (1.6 m), weight 117 lb 12.8 oz (53.434 kg).  General: Awake, oriented, in no distress HEENT: PERRLA, EOMI Neck: No JVP, no bruits Heart: Regular rate rhythm, occasional ectopy Lungs: Decreased breath sounds bilaterally, no rales or rhonchi or wheezes Abdomen: Scaphoid, soft nontender, positive bowel sounds Extremities: No edema Neurologic: Grossly intact Psych: Normal mood and affect  EKG  Sinus rhythm with  occasional PVCs at 91  ASSESSMENT AND PLAN:   Patient Active Problem List   Diagnosis Date Noted  . Community acquired pneumonia 05/17/2013  . COPD (chronic obstructive pulmonary disease) 05/17/2013  . Dyspnea on exertion 04/25/2013  . Emphysema 11/15/2012  . HTN (hypertension) 11/15/2012  . PAF (paroxysmal atrial fibrillation) 06/14/2012  . Long term (current) use of anticoagulants 06/14/2012  . Ventral hernia 07/31/2011  . Abdominal wall mass 07/01/2011   PLAN: 1.  Heather [name] has had no further palpitations. She reports her breathing is much improved after the addition of Spiriva. She is scheduled to followup with Dr. Lamonte Sakai in pulmonology. I would not recommend any changes in her current medications for PAF. Her INR today was 3.3, and adjustments will be made in her warfarin. This could have been related to antibiotics during her recent hospitalization. Plan to see her back in 6 months or sooner as necessary.  Pixie Casino, MD, [facility] Attending Cardiologist [facility] 06/10/2013 5:08 PM

## 2013-06-15 ENCOUNTER — Other Ambulatory Visit: Payer: Self-pay | Admitting: Internal Medicine

## 2013-06-15 DIAGNOSIS — R110 Nausea: Secondary | ICD-10-CM

## 2013-06-15 DIAGNOSIS — R634 Abnormal weight loss: Secondary | ICD-10-CM

## 2013-06-17 ENCOUNTER — Ambulatory Visit
Admission: RE | Admit: 2013-06-17 | Discharge: 2013-06-17 | Disposition: A | Discharge status: Home / Self Care | Payer: Medicare Other | Source: Ambulatory Visit | Attending: Internal Medicine | Admitting: Internal Medicine

## 2013-06-17 DIAGNOSIS — R110 Nausea: Secondary | ICD-10-CM

## 2013-06-17 DIAGNOSIS — R634 Abnormal weight loss: Secondary | ICD-10-CM

## 2013-06-18 ENCOUNTER — Other Ambulatory Visit: Payer: Self-pay | Admitting: Internal Medicine

## 2013-06-18 NOTE — Telephone Encounter (Signed)
Rx was sent to pharmacy electronically. 

## 2013-06-24 ENCOUNTER — Telehealth: Payer: Self-pay | Admitting: *Deleted

## 2013-06-24 ENCOUNTER — Ambulatory Visit (INDEPENDENT_AMBULATORY_CARE_PROVIDER_SITE_OTHER): Payer: Medicare Other | Admitting: Pharmacist Clinician (PhC)/ Clinical Pharmacy Specialist

## 2013-06-24 DIAGNOSIS — Z7901 Long term (current) use of anticoagulants: Secondary | ICD-10-CM

## 2013-06-24 DIAGNOSIS — I480 Paroxysmal atrial fibrillation: Secondary | ICD-10-CM

## 2013-06-24 DIAGNOSIS — I4891 Unspecified atrial fibrillation: Secondary | ICD-10-CM

## 2013-06-24 LAB — POCT INR: INR: 2.2

## 2013-06-24 NOTE — Telephone Encounter (Signed)
Faxed to [facility] GI clearance to hold coumadin 5 days prior to procedure and restart ASAP after.  Endoscopy  Dr. Watt Climes

## 2013-07-04 ENCOUNTER — Telehealth: Payer: Self-pay | Admitting: *Deleted

## 2013-07-04 ENCOUNTER — Ambulatory Visit: Payer: Medicare Other | Admitting: Emergency Medicine

## 2013-07-04 NOTE — Telephone Encounter (Signed)
Returned call.  Left message to call back before 4pm.  No refill request for HCTZ received.  Losartan was sent to [facility] on 3.21.15 and warfarin to [facility].  Want to make sure pt receives the right  Medication refill since no request received from pharmacy.

## 2013-07-04 NOTE — Telephone Encounter (Signed)
Pt was calling in regards to [place] wanting it to be called into [facility] on [address] (phone [phone])  instead of [place]

## 2013-07-05 ENCOUNTER — Telehealth: Payer: Self-pay | Admitting: Internal Medicine

## 2013-07-05 MED ORDER — HYDROCHLOROTHIAZIDE 25 MG PO TABS: 25.0000 mg | ORAL_TABLET | Freq: Every day | ORAL | Status: DC

## 2013-07-05 NOTE — Telephone Encounter (Signed)
Returned call and pt verified x 2.  Pt stated Rx expired for HCTZ and [facility] needs a new script.  Informed Refill(s) sent to pharmacy for 90-day supply as requested.  Pt verbalized understanding and agreed w/ plan.

## 2013-07-05 NOTE — Telephone Encounter (Signed)
Returned call.  Left message to call back before 4pm if assistance still needed.

## 2013-07-05 NOTE — Telephone Encounter (Signed)
Returning your call. °

## 2013-07-21 ENCOUNTER — Ambulatory Visit (INDEPENDENT_AMBULATORY_CARE_PROVIDER_SITE_OTHER): Payer: Medicare Other | Admitting: Emergency Medicine

## 2013-07-21 ENCOUNTER — Encounter (INDEPENDENT_AMBULATORY_CARE_PROVIDER_SITE_OTHER): Payer: Self-pay

## 2013-07-21 ENCOUNTER — Encounter: Payer: Self-pay | Admitting: Emergency Medicine

## 2013-07-21 VITALS — BP 128/72 | HR 82 | Ht 63.0 in | Wt 116.0 lb

## 2013-07-21 DIAGNOSIS — J449 Chronic obstructive pulmonary disease, unspecified: Secondary | ICD-10-CM

## 2013-07-21 NOTE — Patient Instructions (Signed)
Continue spiriva daily Use proventil 2 puffs if needed for shortness of breath Walking oximetry today Follow with Dr Lamonte Sakai in 6 months or sooner if you have any problems

## 2013-07-21 NOTE — Progress Notes (Signed)
   Subjective:    Patient ID: Heather [name], female    DOB: 1927/03/12, 80 y.o.   MRN: [id number]  HPI 80 yo former smoker, hx of HTN, A Fib, GERD, HH, hypothyroidism, progressive dyspnea for 3 years that has worsened significantly over last 6 months. She also hears a wheeze, happens with exertion. She is followed by Dr Ferne Reus who discovered that she was in A fib around the time her dyspnea was first evaluated. She underwent CPST in 2/14 that suggested a resp limitation with severe AFL. Her cardiac stress test in 2012 was normal. She has been tried on Symbicort and Advair without benefit. Her thyroid testing has been good.   ROV 05/31/13 -- follows up for dyspnea and wheezing with an evaluation as above. She returns today after PFT > severe AFL, no BD response, decreased diffusion capacity. She is using proventil bid and it seems to be helping her.   ROV 07/21/13 -- HTN, A Fib, GERD, HH, hypothyroidism, COPD now confirmed on PFT. Last time we started spiriva > she benefited initially but over the last month she has been doing worse again. She is walking a lot more, but she is also using SABA a few times a day. She has not noted that she has palpitations   Review of Systems  Constitutional: Positive for unexpected weight change. Negative for fever and chills.  HENT: Positive for rhinorrhea and trouble swallowing. Negative for congestion, dental problem, ear pain, nosebleeds, postnasal drip, sinus pressure, sneezing, sore throat and voice change.   Eyes: Negative for visual disturbance.  Respiratory: Positive for shortness of breath. Negative for cough and choking.   Cardiovascular: Positive for leg swelling. Negative for chest pain.  Gastrointestinal: Positive for nausea and abdominal pain. Negative for vomiting and diarrhea.  Genitourinary: Negative for difficulty urinating.  Musculoskeletal: Negative for arthralgias.  Skin: Negative for rash.  Neurological: Negative for tremors, syncope and  headaches.  Hematological: Does not bruise/bleed easily.         Objective:   Physical Exam Filed Vitals:   07/21/13 1141  BP: 128/72  Pulse: 82  Height: 5\' 3"  (1.6 m)  Weight: 116 lb (52.617 kg)  SpO2: 96%   Gen: Pleasant, thin woman, in no distress,  normal affect, some kyphosis  ENT: No lesions,  mouth clear,  oropharynx clear, no postnasal drip  Neck: No JVD, no TMG, no carotid bruits  Lungs: No use of accessory muscles, distant, clear without rales or rhonchi. No wheeze.   Cardiovascular: RRR with a rare skipped beat, heart sounds normal, no murmur or gallops, no peripheral edema  Musculoskeletal: No deformities, no cyanosis or clubbing  Neuro: alert, non focal  Skin: Warm, no lesions or rashes      Assessment & Plan:  COPD (chronic obstructive pulmonary disease) - continue spiriva for now; need to consider the possibility that this has caused her to have more A fib than her prior baseline - walking oximetry today - SABA prn - rov 6

## 2013-07-21 NOTE — Assessment & Plan Note (Signed)
-   continue spiriva for now; need to consider the possibility that this has caused her to have more A fib than her prior baseline - walking oximetry today - SABA prn - rov 6

## 2013-07-22 ENCOUNTER — Ambulatory Visit (INDEPENDENT_AMBULATORY_CARE_PROVIDER_SITE_OTHER): Payer: Medicare Other | Admitting: Pharmacist Clinician (PhC)/ Clinical Pharmacy Specialist

## 2013-07-22 VITALS — BP 118/84 | HR 84

## 2013-07-22 DIAGNOSIS — I480 Paroxysmal atrial fibrillation: Secondary | ICD-10-CM

## 2013-07-22 DIAGNOSIS — Z7901 Long term (current) use of anticoagulants: Secondary | ICD-10-CM

## 2013-07-22 DIAGNOSIS — I4891 Unspecified atrial fibrillation: Secondary | ICD-10-CM

## 2013-07-22 LAB — POCT INR: INR: 2.2

## 2013-07-22 MED ORDER — WARFARIN SODIUM 2.5 MG PO TABS: ORAL_TABLET | ORAL | Status: DC

## 2013-07-26 ENCOUNTER — Telehealth: Payer: Self-pay | Admitting: Emergency Medicine

## 2013-07-26 NOTE — Telephone Encounter (Signed)
Have received these forms and will have RB sign next week when in office  Pt aware

## 2013-07-27 NOTE — Telephone Encounter (Signed)
Spoke with pt and advised her that I have received and filled out her paperwork for patient assistance for her spiriva. She is aware that RB will not be in the office until Wed. Next and he will sign at that time and I will fax them in along with rx and all necessary info.

## 2013-08-02 MED ORDER — TIOTROPIUM BROMIDE MONOHYDRATE 18 MCG IN CAPS: 18.0000 ug | ORAL_CAPSULE | Freq: Every day | RESPIRATORY_TRACT | Status: DC

## 2013-08-03 NOTE — Telephone Encounter (Signed)
These forms and rx have been signed by RB and faxed back for PA with spiriva. Pt aware this has been done Awaiting decision and pt to call if she does not get approved

## 2013-08-19 ENCOUNTER — Ambulatory Visit (INDEPENDENT_AMBULATORY_CARE_PROVIDER_SITE_OTHER): Payer: Medicare Other | Admitting: Pharmacist Clinician (PhC)/ Clinical Pharmacy Specialist

## 2013-08-19 DIAGNOSIS — I480 Paroxysmal atrial fibrillation: Secondary | ICD-10-CM

## 2013-08-19 DIAGNOSIS — Z7901 Long term (current) use of anticoagulants: Secondary | ICD-10-CM

## 2013-08-19 DIAGNOSIS — I4891 Unspecified atrial fibrillation: Secondary | ICD-10-CM

## 2013-08-19 LAB — POCT INR: INR: 1.9

## 2013-08-23 ENCOUNTER — Other Ambulatory Visit: Payer: Self-pay | Admitting: *Deleted

## 2013-08-23 MED ORDER — TIOTROPIUM BROMIDE MONOHYDRATE 18 MCG IN CAPS: 18.0000 ug | ORAL_CAPSULE | Freq: Every day | RESPIRATORY_TRACT | Status: DC

## 2013-08-29 ENCOUNTER — Encounter (INDEPENDENT_AMBULATORY_CARE_PROVIDER_SITE_OTHER): Payer: Medicare Other | Admitting: General Surgery

## 2013-09-13 ENCOUNTER — Telehealth: Payer: Self-pay | Admitting: Pharmacist Clinician (PhC)/ Clinical Pharmacy Specialist

## 2013-09-13 NOTE — Telephone Encounter (Signed)
Has dental appt Thursday 18, will hold x 5 days.  Moved appt from 6/19 to 6/26

## 2013-09-16 ENCOUNTER — Ambulatory Visit: Payer: Medicare Other | Admitting: Pharmacist Clinician (PhC)/ Clinical Pharmacy Specialist

## 2013-09-20 ENCOUNTER — Encounter (INDEPENDENT_AMBULATORY_CARE_PROVIDER_SITE_OTHER): Payer: Self-pay | Admitting: General Surgery

## 2013-09-20 ENCOUNTER — Ambulatory Visit (INDEPENDENT_AMBULATORY_CARE_PROVIDER_SITE_OTHER): Payer: Medicare Other | Admitting: General Surgery

## 2013-09-20 ENCOUNTER — Encounter (INDEPENDENT_AMBULATORY_CARE_PROVIDER_SITE_OTHER): Payer: Self-pay

## 2013-09-20 VITALS — BP 124/80 | HR 76 | Temp 98.2°F | Ht 63.0 in | Wt 117.0 lb

## 2013-09-20 DIAGNOSIS — K8020 Calculus of gallbladder without cholecystitis without obstruction: Secondary | ICD-10-CM

## 2013-09-20 NOTE — Progress Notes (Signed)
Patient ID: Heather [name], female   DOB: [birth year], 80 y.o.   MRN: [id number]  Chief Complaint  Patient presents with  . eval gallstones    HPI Heather [name] is a 80 y.o. female.  We are asked to see the patient in consultation by Dr. Reynaldo Minium to evaluate her for gallstones. The patient is an 80 year old white female who has been experiencing some right upper quadrant discomfort for the last few months. She has had some nausea but no vomiting. It does not matter what food she eats because the pain is fairly constant. She does not eat because of the discomfort. She underwent a CT scan which did show stones in her gallbladder but no gallbladder wall thickening or duct dilatation. She does have a history of significant COPD and coronary artery disease. HPI  Past Medical History  Diagnosis Date  . Atrial fibrillation   . 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 COPD (chronic obstructive pulmonary disease)   . Essential hypertension, benign   . Unspecified hypothyroidism   . Gallstones   . Peripheral neuropathy   . Venous insufficiency   . Osteoporosis   . Pneumonia     states she had it twice, last time a couple of months ago  . Shortness of breath     with exertion  . GERD (gastroesophageal reflux disease)   . H/O hiatal hernia   . Arthritis   . Anxiety   . Mild aortic insufficiency   . History of nuclear stress test 10/2010    dipyridamole; normal pattern of perfusion; low risk, normal study  . PONV (postoperative nausea and vomiting)     Past Surgical History  Procedure Laterality Date  . Total abdominal hysterectomy  1970  . Tonsillectomy  1935  . Left hip relacement  2000  . Back surgery      x2  . Colon surgery  2008    partial  . Appendectomy  1935  . Cataract extraction w/ intraocular lens  implant, bilateral    . Ventral hernia repair  09/19/2011    Procedure: LAPAROSCOPIC VENTRAL HERNIA;  Surgeon: Merrie Roof, MD;  Location: [facility];  Service: General;  Laterality: N/A;  laparoscopic ventral hernia repair with mesh  . Cardiometablic testing  6/64/4034    submaximal effort with peak RER of 0.5, peak VO2 79% predicted; HR peak up to 78%; PVC was 55% predicted, PEV1 39% predicted; PEV1/VC ratio was reduced; normal vital capacity; DLCO was reduced to 63%  . Transthoracic echocardiogram  05/2011    EF=>55%; mild conc LVH; mild mitral annular calcif; mild TR; AV mildly sclerotic & mild AR    Family History  Problem Relation Age of Onset  . Stroke Mother   . Stroke Father   . Heart block Brother   . Bladder Cancer Brother   . Diabetes Brother   . Stroke Brother     Social History History  Substance Use Topics  . Smoking status: Former Smoker -- 1.00 packs/day for 20 years    Types: Cigarettes    Quit date: 09/11/1988  . Smokeless tobacco: Never Used  . Alcohol Use: No    Allergies  Allergen Reactions  . Ace Inhibitors Cough  . Sulfur Nausea And Vomiting    Current Outpatient Prescriptions  Medication Sig Dispense Refill  . albuterol (PROVENTIL HFA;VENTOLIN HFA) 108 (90 BASE) MCG/ACT inhaler Inhale 2 puffs into the lungs every 6 (six) hours as needed for wheezing or shortness of  breath.  1 Inhaler  2  . calcium citrate-vitamin D (CITRACAL+D) 315-200 MG-UNIT per tablet Take 1 tablet by mouth daily.      Marland Kitchen diltiazem (CARDIZEM CD) 180 MG 24 hr capsule Take 180 mg by mouth daily.      . hydrochlorothiazide (HYDRODIURIL) 25 MG tablet Take 1 tablet (25 mg total) by mouth daily.  90 tablet  2  . levothyroxine (SYNTHROID, LEVOTHROID) 137 MCG tablet Take half tablet on Monday, Wednesday, and Friday. Take 1 tablet all other days      . losartan (COZAAR) 100 MG tablet TAKE 1 TABLET BY MOUTH DAILY  30 tablet  11  . Multiple Vitamins-Minerals (MULTIVITAMIN WITH MINERALS) tablet Take 1 tablet by mouth daily.      . pantoprazole (PROTONIX) 40 MG tablet Take 40 mg by mouth daily.      Marland Kitchen tiotropium (SPIRIVA) 18 MCG  inhalation capsule Place 1 capsule (18 mcg total) into inhaler and inhale daily.  30 capsule  3  . warfarin (COUMADIN) 2.5 MG tablet Take 1 to 1 & 1/2 tablets by mouth daily as directed  40 tablet  5   No current facility-administered medications for this visit.    Review of Systems Review of Systems  Constitutional: Positive for appetite change.  HENT: Negative.   Eyes: Negative.   Respiratory: Negative.   Cardiovascular: Negative.   Gastrointestinal: Positive for nausea and abdominal pain.  Endocrine: Negative.   Genitourinary: Negative.   Musculoskeletal: Negative.   Skin: Negative.   Allergic/Immunologic: Negative.   Neurological: Negative.   Hematological: Negative.   Psychiatric/Behavioral: Negative.     Blood pressure 124/80, pulse 76, temperature 98.2 F (36.8 C), height 5\' 3"  (1.6 m), weight 117 lb (53.071 kg).  Physical Exam Physical Exam  Constitutional: She is oriented to person, place, and time. She appears well-developed and well-nourished.  HENT:  Head: Normocephalic and atraumatic.  Eyes: Conjunctivae and EOM are normal. Pupils are equal, round, and reactive to light.  Neck: Normal range of motion. Neck supple.  Cardiovascular: Normal rate, regular rhythm and normal heart sounds.   Pulmonary/Chest: Effort normal and breath sounds normal.  Abdominal: Soft. Bowel sounds are normal.  There is mild right upper quadrant discomfort. There is no palpable mass.  Musculoskeletal: Normal range of motion.  Lymphadenopathy:    She has no cervical adenopathy.  Neurological: She is alert and oriented to person, place, and time.  Skin: Skin is warm and dry.  Psychiatric: She has a normal mood and affect. Her behavior is normal.    Data Reviewed As above  Assessment    The patient does appear to have symptomatic gallstones. Because of the risk of further painful episodes I think she would probably benefit from having her gallbladder removed. Unfortunately she is  high risk because of her coronary disease and COPD. I've discussed with her in detail the risks and benefits of the operation as well as some of the technical aspects and she understands.     Plan    At this point we will plan for medical and pulmonary clearance and if she is cleared and we will plan for possible laparoscopic cholecystectomy versus open cholecystectomy        TOTH III,Abdiaziz Klahn S 09/20/2013, 12:20 PM

## 2013-09-20 NOTE — Patient Instructions (Signed)
Will need cardiac and pulmonary clearance then plan for cholecystectomy

## 2013-09-23 ENCOUNTER — Ambulatory Visit: Payer: Medicare Other | Admitting: Pharmacist Clinician (PhC)/ Clinical Pharmacy Specialist

## 2013-09-29 ENCOUNTER — Telehealth: Payer: Self-pay | Admitting: *Deleted

## 2013-09-29 NOTE — Telephone Encounter (Signed)
Patient is cleared for surgery (cholecystectomy) - low risk Patient should hold coumadin 5 days prior to procedure and restart after.   Clearance letter faxed and also routed to [facility], [place] and Dr. Marlou Starks

## 2013-10-03 ENCOUNTER — Ambulatory Visit (INDEPENDENT_AMBULATORY_CARE_PROVIDER_SITE_OTHER): Payer: Medicare Other | Admitting: Pharmacist

## 2013-10-03 DIAGNOSIS — I480 Paroxysmal atrial fibrillation: Secondary | ICD-10-CM

## 2013-10-03 DIAGNOSIS — I4891 Unspecified atrial fibrillation: Secondary | ICD-10-CM

## 2013-10-03 DIAGNOSIS — Z7901 Long term (current) use of anticoagulants: Secondary | ICD-10-CM

## 2013-10-03 LAB — POCT INR: INR: 2.1

## 2013-10-07 ENCOUNTER — Encounter (INDEPENDENT_AMBULATORY_CARE_PROVIDER_SITE_OTHER): Payer: Self-pay

## 2013-10-10 ENCOUNTER — Other Ambulatory Visit (INDEPENDENT_AMBULATORY_CARE_PROVIDER_SITE_OTHER): Payer: Self-pay | Admitting: General Surgery

## 2013-11-02 ENCOUNTER — Telehealth (INDEPENDENT_AMBULATORY_CARE_PROVIDER_SITE_OTHER): Payer: Self-pay

## 2013-11-02 ENCOUNTER — Other Ambulatory Visit: Payer: Self-pay | Admitting: Internal Medicine

## 2013-11-02 DIAGNOSIS — M5416 Radiculopathy, lumbar region: Secondary | ICD-10-CM

## 2013-11-02 NOTE — Telephone Encounter (Signed)
I returned pts call. She has an upcoming MRI for leg issues that may be involving her back. She may need a back surgery. She also has a trip coming up. She states she would like to get all those things out of the way before scheduling surgery. She will call back to schedule. Asked schedulers to place orders in pending folder.

## 2013-11-02 NOTE — Telephone Encounter (Signed)
Message copied by Carlene Coria on Wed Nov 02, 2013  4:31 PM ------      Message from: Renie Ora      Created: Tue Nov 01, 2013  4:50 PM      Regarding: Patient questions before surgery       Hi there,            I called this patient today to set up her surgery and she said that we were waiting for clearance from her PCP and her cardiologist before we could set it up. She also has some travel plans the end of September and has some concerns about her recovery time. Can you please call her and then let me know when we are all set to look at dates with her.            Thank you      Maudie Mercury ------

## 2013-11-10 ENCOUNTER — Ambulatory Visit
Admission: RE | Admit: 2013-11-10 | Discharge: 2013-11-10 | Disposition: A | Discharge status: Home / Self Care | Payer: Medicare Other | Source: Ambulatory Visit | Attending: Internal Medicine | Admitting: Internal Medicine

## 2013-11-10 DIAGNOSIS — M5416 Radiculopathy, lumbar region: Secondary | ICD-10-CM

## 2013-11-16 ENCOUNTER — Encounter: Payer: Self-pay | Admitting: Internal Medicine

## 2013-11-16 ENCOUNTER — Ambulatory Visit (INDEPENDENT_AMBULATORY_CARE_PROVIDER_SITE_OTHER): Payer: Medicare Other | Admitting: Internal Medicine

## 2013-11-16 ENCOUNTER — Ambulatory Visit (INDEPENDENT_AMBULATORY_CARE_PROVIDER_SITE_OTHER): Payer: Medicare Other | Admitting: Pharmacist Clinician (PhC)/ Clinical Pharmacy Specialist

## 2013-11-16 VITALS — BP 146/80 | HR 89 | Ht 65.0 in | Wt 115.2 lb

## 2013-11-16 DIAGNOSIS — R0989 Other specified symptoms and signs involving the circulatory and respiratory systems: Secondary | ICD-10-CM

## 2013-11-16 DIAGNOSIS — I4891 Unspecified atrial fibrillation: Secondary | ICD-10-CM

## 2013-11-16 DIAGNOSIS — R0609 Other forms of dyspnea: Secondary | ICD-10-CM

## 2013-11-16 DIAGNOSIS — M79609 Pain in unspecified limb: Secondary | ICD-10-CM

## 2013-11-16 DIAGNOSIS — L539 Erythematous condition, unspecified: Secondary | ICD-10-CM

## 2013-11-16 DIAGNOSIS — J438 Other emphysema: Secondary | ICD-10-CM

## 2013-11-16 DIAGNOSIS — I10 Essential (primary) hypertension: Secondary | ICD-10-CM

## 2013-11-16 DIAGNOSIS — I480 Paroxysmal atrial fibrillation: Secondary | ICD-10-CM

## 2013-11-16 DIAGNOSIS — Z7901 Long term (current) use of anticoagulants: Secondary | ICD-10-CM

## 2013-11-16 DIAGNOSIS — M79606 Pain in leg, unspecified: Secondary | ICD-10-CM

## 2013-11-16 LAB — POCT INR: INR: 1.8

## 2013-11-16 NOTE — Patient Instructions (Signed)
Your physician has requested that you have a lower extremity arterial duplex. This test is an ultrasound of the arteries in the legs. It looks at arterial blood flow in the legs. Allow one hour for Lower Arterial scans. There are no restrictions or special instructions  Your physician wants you to follow-up in: 6 months. You will receive a reminder letter in the mail two months in advance. If you don't receive a letter, please call our office to schedule the follow-up appointment.

## 2013-11-18 ENCOUNTER — Other Ambulatory Visit: Payer: Self-pay | Admitting: Internal Medicine

## 2013-11-18 ENCOUNTER — Encounter: Payer: Self-pay | Admitting: Internal Medicine

## 2013-11-18 DIAGNOSIS — M5416 Radiculopathy, lumbar region: Secondary | ICD-10-CM

## 2013-11-18 NOTE — Progress Notes (Signed)
11/18/2013   Heather [name]   Oct 14, 1926  [id number]  Primary Physicia ARONSON,RICHARD A, MD Primary Cardiologist: Dr Debara Pickett  HPI:  Pleasant 80 y/o followed by Dr Debara Pickett with a history of PAF. She recently had a MET test that indicated Emphysema although she has no history of smoking. She is on chronic Coumadin. Her B/P has been controlled by her readings at home and our readings here by our pharmacist. Echo in Feb 2013 showed an EF of >55%, NL LA size, and NL RVF. She had an episode of weakness and palpitations 11/13/12 pm. EMS was contacted and she was taken to [facility]. She received Diltiazem en route and was in NSR when she arrived at [facility]. They suggested she increase her Diltiazem to 240 mg daily but she was hesitant to do this.  Heather [name] returns today for followup. She is extubated doing remarkably well. Her shortness of breath has stabilized and she has not been admitted recently for worsening COPD or pulmonary problems. She is maintaining a sinus rhythm in the 80s without any evidence of recurrent A. fib. She is on warfarin with therapeutic INRs.  She does have some dependent rubor in both of her legs. I was able to palpate a decent dorsalis pedis pulse on the right foot but not on the left. She may have small vessel disease of the toes. It would be reasonable to consider arterial Dopplers.  Current Outpatient Prescriptions  Medication Sig Dispense Refill  . albuterol (PROVENTIL HFA;VENTOLIN HFA) 108 (90 BASE) MCG/ACT inhaler Inhale 2 puffs into the lungs every 6 (six) hours as needed for wheezing or shortness of breath.  1 Inhaler  2  . calcium citrate-vitamin D (CITRACAL+D) 315-200 MG-UNIT per tablet Take 1 tablet by mouth daily.      . Cinnamon 500 MG capsule Take 1,000 mg by mouth daily.      Marland Kitchen diltiazem (CARDIZEM CD) 180 MG 24 hr capsule Take 180 mg by mouth daily.      . hydrochlorothiazide (HYDRODIURIL) 25 MG tablet Take 1 tablet (25 mg total) by mouth daily.  90 tablet  2  . levothyroxine  (SYNTHROID, LEVOTHROID) 137 MCG tablet Take half tablet on Monday, Wednesday, and Friday. Take 1 tablet all other days      . losartan (COZAAR) 100 MG tablet TAKE 1 TABLET BY MOUTH DAILY  30 tablet  11  . Misc Natural Products (GLUCOSAMINE CHOND COMPLEX/MSM) TABS Take 2 capsules by mouth daily.      . Multiple Vitamins-Minerals (MULTIVITAMIN WITH MINERALS) tablet Take 1 tablet by mouth daily.      . Omega-3 Fatty Acids (FISH OIL) 1200 MG CAPS Take 2 capsules by mouth daily.      . pantoprazole (PROTONIX) 40 MG tablet Take 40 mg by mouth daily.      Marland Kitchen warfarin (COUMADIN) 2.5 MG tablet Take 1 to 1 & 1/2 tablets by mouth daily as directed  40 tablet  5   No current facility-administered medications for this visit.    Allergies  Allergen Reactions  . Ace Inhibitors Cough  . Sulfur Nausea And Vomiting    History   Social History  . Marital Status: Married    Spouse Name: N/A    Number of Children: 2  . Years of Education: N/A   Occupational History  . Retired Astronomer    Social History Main Topics  . Smoking status: Former Smoker -- 1.00 packs/day for 20 years    Types: Cigarettes    Quit date:  09/11/1988  . Smokeless tobacco: Never Used  . Alcohol Use: No  . Drug Use: No  . Sexual Activity: Yes   Other Topics Concern  . Not on file   Social History Narrative  . No narrative on file     Review of Systems: General: negative for chills, fever, night sweats or weight changes.  Cardiovascular: negative for chest pain, dyspnea on exertion, edema, orthopnea, palpitations, paroxysmal nocturnal dyspnea or shortness of breath Dermatological: negative for rash Respiratory: negative for cough or wheezing Urologic: negative for hematuria Abdominal: negative for nausea, vomiting, diarrhea, bright red blood per rectum, melena, or hematemesis Neurologic: negative for visual changes, syncope, or dizziness All other systems reviewed and are otherwise negative except as noted  above.    Blood pressure 146/80, pulse 89, height '5\' 5"'  (1.651 m), weight 115 lb 3.2 oz (52.254 kg).  General: Awake, oriented, in no distress HEENT: PERRLA, EOMI Neck: No JVP, no bruits Heart: Regular rate rhythm, occasional ectopy Lungs: Decreased breath sounds bilaterally, no rales or rhonchi or wheezes Abdomen: Scaphoid, soft nontender, positive bowel sounds Extremities: No edema Neurologic: Grossly intact Psych: Normal mood and affect  EKG  Sinus rhythm at 89  ASSESSMENT AND PLAN:   Patient Active Problem List   Diagnosis Date Noted  . Gallstones 09/20/2013  . Community acquired pneumonia 05/17/2013  . COPD (chronic obstructive pulmonary disease) 05/17/2013  . Dyspnea on exertion 04/25/2013  . Pulmonary emphysema 11/15/2012  . HTN (hypertension) 11/15/2012  . PAF (paroxysmal atrial fibrillation) 06/14/2012  . Long term (current) use of anticoagulants 06/14/2012  . Ventral hernia 07/31/2011  . Abdominal wall mass 07/01/2011   PLAN: 1.  Heather [name] has had no further palpitations. She reports her breathing is much improved after the addition of Spiriva. She is scheduled to followup with Dr. Lamonte Sakai in pulmonology. I would not recommend any changes in her current medications for PAF. Her INR is therapeutic. Plan to check lower extremity arterial Dopplers with a focus on particularly blood flow in the digits of the feet. Continue current medications and followup in 6 months.  Pixie Casino, MD, [facility] Attending Cardiologist [facility] 11/18/2013 6:23 PM

## 2013-11-25 ENCOUNTER — Telehealth: Payer: Self-pay | Admitting: Internal Medicine

## 2013-11-25 ENCOUNTER — Ambulatory Visit (HOSPITAL_COMMUNITY)
Admission: RE | Admit: 2013-11-25 | Discharge: 2013-11-25 | Disposition: A | Discharge status: Home / Self Care | Payer: Medicare Other | Source: Ambulatory Visit | Attending: Cardiology | Admitting: Cardiology

## 2013-11-25 DIAGNOSIS — I70209 Unspecified atherosclerosis of native arteries of extremities, unspecified extremity: Secondary | ICD-10-CM | POA: Insufficient documentation

## 2013-11-25 DIAGNOSIS — M79606 Pain in leg, unspecified: Secondary | ICD-10-CM

## 2013-11-25 DIAGNOSIS — L539 Erythematous condition, unspecified: Secondary | ICD-10-CM | POA: Diagnosis present

## 2013-11-25 DIAGNOSIS — M79609 Pain in unspecified limb: Secondary | ICD-10-CM

## 2013-11-25 NOTE — Telephone Encounter (Signed)
LM for Anderson Malta explaining that fax had been received and that Dr. Debara Pickett will address on Monday when he is back in office.

## 2013-11-25 NOTE — Telephone Encounter (Signed)
Anderson Malta was calling in because she stated that she sent a request to the doctor asking if it was ok for the pt to come off of her coumadin for 4 days so that sh could receive an epidural for a procedure that she needs done. Please call  Thanks

## 2013-11-25 NOTE — Progress Notes (Signed)
Lower Extremity Arterial Duplex Completed. °Brianna L Mazza,RVT °

## 2013-11-29 ENCOUNTER — Telehealth: Payer: Self-pay | Admitting: *Deleted

## 2013-11-29 NOTE — Telephone Encounter (Signed)
Faxed clearance to stop coumadin for 4 days, or until INR is below 1.5, for lumbar epidural injection.

## 2013-12-02 ENCOUNTER — Telehealth: Payer: Self-pay | Admitting: Pharmacist Clinician (PhC)/ Clinical Pharmacy Specialist

## 2013-12-02 NOTE — Telephone Encounter (Signed)
Pt LMOM, having spinal injection on 9/10, needs INR same day to take to [place].    Returned call, advised pt to come in between 10-10:30 that day, will have RN draw PT/INR and give pt results to take to [facility].  Pt voiced understanding.

## 2013-12-07 NOTE — Progress Notes (Signed)
LMTCB for test results (LEAs)

## 2013-12-08 ENCOUNTER — Ambulatory Visit
Admission: RE | Admit: 2013-12-08 | Discharge: 2013-12-08 | Disposition: A | Discharge status: Home / Self Care | Payer: Medicare Other | Source: Ambulatory Visit | Attending: Internal Medicine | Admitting: Internal Medicine

## 2013-12-08 ENCOUNTER — Ambulatory Visit (INDEPENDENT_AMBULATORY_CARE_PROVIDER_SITE_OTHER): Payer: Medicare Other | Admitting: *Deleted

## 2013-12-08 DIAGNOSIS — M5416 Radiculopathy, lumbar region: Secondary | ICD-10-CM

## 2013-12-08 DIAGNOSIS — Z7901 Long term (current) use of anticoagulants: Secondary | ICD-10-CM

## 2013-12-08 DIAGNOSIS — I4891 Unspecified atrial fibrillation: Secondary | ICD-10-CM

## 2013-12-08 DIAGNOSIS — I480 Paroxysmal atrial fibrillation: Secondary | ICD-10-CM

## 2013-12-08 LAB — POCT INR: INR: 1

## 2013-12-08 MED ORDER — IOHEXOL 180 MG/ML  SOLN
1.0000 mL | Freq: Once | INTRAMUSCULAR | Status: AC | PRN
  Administered 2013-12-08: 1 mL via EPIDURAL

## 2013-12-08 MED ORDER — METHYLPREDNISOLONE ACETATE 40 MG/ML INJ SUSP (RADIOLOG
120.0000 mg | Freq: Once | INTRAMUSCULAR | Status: AC
  Administered 2013-12-08: 120 mg via EPIDURAL

## 2013-12-08 NOTE — Discharge Instructions (Signed)
Post Procedure Spinal Discharge Instruction Sheet  1. You may resume a regular diet and any medications that you routinely take (including pain medications).  2. No driving day of procedure.  3. Light activity throughout the rest of the day.  Do not do any strenuous work, exercise, bending or lifting.  The day following the procedure, you can resume normal physical activity but you should refrain from exercising or physical therapy for at least three days thereafter.   Common Side Effects:   Headaches- take your usual medications as directed by your physician.  Increase your fluid intake.  Caffeinated beverages may be helpful.  Lie flat in bed until your headache resolves.   Restlessness or inability to sleep- you may have trouble sleeping for the next few days.  Ask your referring physician if you need any medication for sleep.   Facial flushing or redness- should subside within a few days.   Increased pain- a temporary increase in pain a day or two following your procedure is not unusual.  Take your pain medication as prescribed by your referring physician.   Leg cramps  Please contact our office at [phone] for the following symptoms:  Fever greater than 100 degrees.  Headaches unresolved with medication after 2-3 days.  Increased swelling, pain, or redness at injection site.  Thank you for visiting our office.   You may resume Coumadin today.

## 2013-12-16 ENCOUNTER — Ambulatory Visit: Payer: Medicare Other | Admitting: Pharmacist Clinician (PhC)/ Clinical Pharmacy Specialist

## 2013-12-21 ENCOUNTER — Ambulatory Visit (INDEPENDENT_AMBULATORY_CARE_PROVIDER_SITE_OTHER): Payer: Medicare Other | Admitting: Pharmacist Clinician (PhC)/ Clinical Pharmacy Specialist

## 2013-12-21 DIAGNOSIS — I480 Paroxysmal atrial fibrillation: Secondary | ICD-10-CM

## 2013-12-21 DIAGNOSIS — I4891 Unspecified atrial fibrillation: Secondary | ICD-10-CM

## 2013-12-21 DIAGNOSIS — Z7901 Long term (current) use of anticoagulants: Secondary | ICD-10-CM

## 2013-12-21 LAB — POCT INR: INR: 3

## 2013-12-23 ENCOUNTER — Ambulatory Visit: Payer: Medicare Other | Admitting: Pharmacist Clinician (PhC)/ Clinical Pharmacy Specialist

## 2014-01-18 ENCOUNTER — Other Ambulatory Visit: Payer: Self-pay | Admitting: Internal Medicine

## 2014-01-18 ENCOUNTER — Ambulatory Visit (INDEPENDENT_AMBULATORY_CARE_PROVIDER_SITE_OTHER): Payer: Medicare Other | Admitting: Pharmacist Clinician (PhC)/ Clinical Pharmacy Specialist

## 2014-01-18 DIAGNOSIS — M5416 Radiculopathy, lumbar region: Secondary | ICD-10-CM

## 2014-01-18 DIAGNOSIS — I480 Paroxysmal atrial fibrillation: Secondary | ICD-10-CM

## 2014-01-18 LAB — POCT INR: INR: 2.1

## 2014-01-23 ENCOUNTER — Ambulatory Visit
Admission: RE | Admit: 2014-01-23 | Discharge: 2014-01-23 | Disposition: A | Discharge status: Home / Self Care | Payer: Medicare Other | Source: Ambulatory Visit | Attending: Internal Medicine | Admitting: Internal Medicine

## 2014-01-23 ENCOUNTER — Ambulatory Visit (INDEPENDENT_AMBULATORY_CARE_PROVIDER_SITE_OTHER): Payer: Medicare Other | Admitting: Pharmacist Clinician (PhC)/ Clinical Pharmacy Specialist

## 2014-01-23 VITALS — BP 137/59 | HR 77

## 2014-01-23 DIAGNOSIS — Z7901 Long term (current) use of anticoagulants: Secondary | ICD-10-CM

## 2014-01-23 DIAGNOSIS — M5416 Radiculopathy, lumbar region: Secondary | ICD-10-CM

## 2014-01-23 DIAGNOSIS — I480 Paroxysmal atrial fibrillation: Secondary | ICD-10-CM

## 2014-01-23 DIAGNOSIS — I4891 Unspecified atrial fibrillation: Secondary | ICD-10-CM

## 2014-01-23 LAB — POCT INR: INR: 1

## 2014-01-23 MED ORDER — IOHEXOL 180 MG/ML  SOLN
1.0000 mL | Freq: Once | INTRAMUSCULAR | Status: AC | PRN
  Administered 2014-01-23: 1 mL via EPIDURAL

## 2014-01-23 MED ORDER — METHYLPREDNISOLONE ACETATE 40 MG/ML INJ SUSP (RADIOLOG
120.0000 mg | Freq: Once | INTRAMUSCULAR | Status: AC
  Administered 2014-01-23: 120 mg via EPIDURAL

## 2014-02-06 ENCOUNTER — Ambulatory Visit (INDEPENDENT_AMBULATORY_CARE_PROVIDER_SITE_OTHER): Payer: Medicare Other | Admitting: Pharmacist Clinician (PhC)/ Clinical Pharmacy Specialist

## 2014-02-06 DIAGNOSIS — I480 Paroxysmal atrial fibrillation: Secondary | ICD-10-CM

## 2014-02-06 DIAGNOSIS — I4891 Unspecified atrial fibrillation: Secondary | ICD-10-CM

## 2014-02-06 DIAGNOSIS — Z7901 Long term (current) use of anticoagulants: Secondary | ICD-10-CM

## 2014-02-06 LAB — POCT INR: INR: 3.3

## 2014-02-08 ENCOUNTER — Telehealth: Payer: Self-pay | Admitting: *Deleted

## 2014-02-08 NOTE — Telephone Encounter (Signed)
Faxed clearance for surgery (right hand middle & poss ring finger extensor realignment & reconstruction poss join replacement) to [place]: [facility]. >> Performed by Dr. Amedeo Plenty >> Surgery scheduled for Jan. 12, 2016.   Patient can hold warfain 5 days prior to surgery & restart after (may need pulmonary eval pre-op as well <denoted on clearance form by Dr. Debara Pickett)

## 2014-02-15 ENCOUNTER — Ambulatory Visit: Payer: Medicare Other | Admitting: Emergency Medicine

## 2014-02-20 ENCOUNTER — Emergency Department (HOSPITAL_COMMUNITY): Payer: Medicare Other

## 2014-02-20 ENCOUNTER — Emergency Department (HOSPITAL_COMMUNITY)
Admission: EM | Admit: 2014-02-20 | Discharge: 2014-02-20 | Disposition: A | Discharge status: Home / Self Care | Payer: Medicare Other | Attending: Emergency Medicine | Admitting: Emergency Medicine

## 2014-02-20 ENCOUNTER — Encounter (HOSPITAL_COMMUNITY): Payer: Self-pay | Admitting: Family Medicine

## 2014-02-20 DIAGNOSIS — Z7901 Long term (current) use of anticoagulants: Secondary | ICD-10-CM | POA: Diagnosis not present

## 2014-02-20 DIAGNOSIS — I4891 Unspecified atrial fibrillation: Secondary | ICD-10-CM | POA: Insufficient documentation

## 2014-02-20 DIAGNOSIS — J449 Chronic obstructive pulmonary disease, unspecified: Secondary | ICD-10-CM | POA: Insufficient documentation

## 2014-02-20 DIAGNOSIS — Z8669 Personal history of other diseases of the nervous system and sense organs: Secondary | ICD-10-CM | POA: Diagnosis not present

## 2014-02-20 DIAGNOSIS — Z8659 Personal history of other mental and behavioral disorders: Secondary | ICD-10-CM | POA: Diagnosis not present

## 2014-02-20 DIAGNOSIS — Z87891 Personal history of nicotine dependence: Secondary | ICD-10-CM | POA: Diagnosis not present

## 2014-02-20 DIAGNOSIS — R079 Chest pain, unspecified: Secondary | ICD-10-CM

## 2014-02-20 DIAGNOSIS — M810 Age-related osteoporosis without current pathological fracture: Secondary | ICD-10-CM | POA: Insufficient documentation

## 2014-02-20 DIAGNOSIS — K8050 Calculus of bile duct without cholangitis or cholecystitis without obstruction: Secondary | ICD-10-CM

## 2014-02-20 DIAGNOSIS — R1013 Epigastric pain: Secondary | ICD-10-CM

## 2014-02-20 DIAGNOSIS — Z8701 Personal history of pneumonia (recurrent): Secondary | ICD-10-CM | POA: Diagnosis not present

## 2014-02-20 DIAGNOSIS — I10 Essential (primary) hypertension: Secondary | ICD-10-CM | POA: Diagnosis not present

## 2014-02-20 DIAGNOSIS — E039 Hypothyroidism, unspecified: Secondary | ICD-10-CM | POA: Insufficient documentation

## 2014-02-20 DIAGNOSIS — Z79899 Other long term (current) drug therapy: Secondary | ICD-10-CM | POA: Insufficient documentation

## 2014-02-20 DIAGNOSIS — R1010 Upper abdominal pain, unspecified: Secondary | ICD-10-CM | POA: Diagnosis present

## 2014-02-20 LAB — I-STAT TROPONIN, ED: TROPONIN I, POC: 0.02 ng/mL (ref 0.00–0.08)

## 2014-02-20 LAB — CBC
HEMATOCRIT: 43.5 % (ref 36.0–46.0)
HEMOGLOBIN: 15.6 g/dL — AB (ref 12.0–15.0)
MCH: 34.5 pg — ABNORMAL HIGH (ref 26.0–34.0)
MCHC: 35.9 g/dL (ref 30.0–36.0)
MCV: 96.2 fL (ref 78.0–100.0)
Platelets: 324 10*3/uL (ref 150–400)
RBC: 4.52 MIL/uL (ref 3.87–5.11)
RDW: 13.8 % (ref 11.5–15.5)
WBC: 12.8 10*3/uL — AB (ref 4.0–10.5)

## 2014-02-20 LAB — BASIC METABOLIC PANEL
Anion gap: 14 (ref 5–15)
BUN: 17 mg/dL (ref 6–23)
CHLORIDE: 93 meq/L — AB (ref 96–112)
CO2: 27 mEq/L (ref 19–32)
Calcium: 9 mg/dL (ref 8.4–10.5)
Creatinine, Ser: 0.65 mg/dL (ref 0.50–1.10)
GFR calc Af Amer: >90 mL/min (ref >90)
GFR calc non Af Amer: 79 mL/min — ABNORMAL LOW (ref >90)
GLUCOSE: 126 mg/dL — AB (ref 70–99)
Potassium: 3.1 mEq/L — ABNORMAL LOW (ref 3.7–5.3)
SODIUM: 134 meq/L — AB (ref 137–147)

## 2014-02-20 LAB — HEPATIC FUNCTION PANEL
ALT: 17 U/L (ref 0–35)
AST: 24 U/L (ref 0–37)
Albumin: 3.5 g/dL (ref 3.5–5.2)
Alkaline Phosphatase: 80 U/L (ref 39–117)
Total Bilirubin: 0.8 mg/dL (ref 0.3–1.2)
Total Protein: 6.4 g/dL (ref 6.0–8.3)

## 2014-02-20 LAB — PRO B NATRIURETIC PEPTIDE: PRO B NATRI PEPTIDE: 121.6 pg/mL (ref 0–450)

## 2014-02-20 LAB — LIPASE, BLOOD: Lipase: 34 U/L (ref 11–59)

## 2014-02-20 LAB — PROTIME-INR
INR: 4.35 — ABNORMAL HIGH (ref 0.00–1.49)
PROTHROMBIN TIME: 42 s — AB (ref 11.6–15.2)

## 2014-02-20 MED ORDER — HYDROCODONE-ACETAMINOPHEN 5-325 MG PO TABS: 1.0000 | ORAL_TABLET | Freq: Four times a day (QID) | ORAL | Status: DC | PRN

## 2014-02-20 MED ORDER — ONDANSETRON HCL 4 MG/2ML IJ SOLN
4.0000 mg | Freq: Once | INTRAMUSCULAR | Status: AC
  Administered 2014-02-20: 4 mg via INTRAVENOUS
  Filled 2014-02-20: qty 2

## 2014-02-20 MED ORDER — SODIUM CHLORIDE 0.9 % IV SOLN
INTRAVENOUS | Status: DC
  Administered 2014-02-20: 17:00:00 via INTRAVENOUS

## 2014-02-20 MED ORDER — PANTOPRAZOLE SODIUM 40 MG IV SOLR
40.0000 mg | Freq: Once | INTRAVENOUS | Status: AC
  Administered 2014-02-20: 40 mg via INTRAVENOUS
  Filled 2014-02-20: qty 40

## 2014-02-20 MED ORDER — SODIUM CHLORIDE 0.9 % IV BOLUS (SEPSIS)
500.0000 mL | Freq: Once | INTRAVENOUS | Status: AC
  Administered 2014-02-20: 500 mL via INTRAVENOUS

## 2014-02-20 MED ORDER — FENTANYL CITRATE 0.05 MG/ML IJ SOLN
100.0000 ug | Freq: Once | INTRAMUSCULAR | Status: AC
  Administered 2014-02-20: 100 ug via INTRAVENOUS
  Filled 2014-02-20: qty 2

## 2014-02-20 NOTE — ED Notes (Signed)
Signature pad in room not working, pt reports she understands discharge instructions.

## 2014-02-20 NOTE — ED Notes (Signed)
Notified lab of need to add on Lipase and Hepatic function.

## 2014-02-20 NOTE — ED Provider Notes (Signed)
CSN: [id number]     Arrival date & time 02/20/14  1409 History   First MD Initiated Contact with Patient 02/20/14 1420     Chief Complaint  Patient presents with  . Chest Pain  . Abdominal Pain     (Consider location/radiation/quality/duration/timing/severity/associated sxs/prior Treatment) HPI   Heather [name] is a 80 y.o. female who presents for evaluation of upper abdominal pain, for several days, that feels "tight".  During this time, she has had decreased appetite.  Onset of the pain was preceded by use of antibiotic, for lung infection.  She saw her PCP for productive cough 1 week ago.  Her cough is better now.  She denies fever, chills, nausea, vomiting, weakness, or dizziness.  She is taking her usual medications, there are no other known modifying factors.   Past Medical History  Diagnosis Date  . Atrial fibrillation   . 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 COPD (chronic obstructive pulmonary disease)   . Essential hypertension, benign   . Unspecified hypothyroidism   . Gallstones   . Peripheral neuropathy   . Venous insufficiency   . Osteoporosis   . Pneumonia     states she had it twice, last time a couple of months ago  . Shortness of breath     with exertion  . GERD (gastroesophageal reflux disease)   . H/O hiatal hernia   . Arthritis   . Anxiety   . Mild aortic insufficiency   . History of nuclear stress test 10/2010    dipyridamole; normal pattern of perfusion; low risk, normal study  . PONV (postoperative nausea and vomiting)    Past Surgical History  Procedure Laterality Date  . Total abdominal hysterectomy  1970  . Tonsillectomy  1935  . Left hip relacement  2000  . Back surgery      x2  . Colon surgery  2008    partial  . Appendectomy  1935  . Cataract extraction w/ intraocular lens  implant, bilateral    . Ventral hernia repair  09/19/2011    Procedure: LAPAROSCOPIC VENTRAL HERNIA;  Surgeon: Merrie Roof,  MD;  Location: [facility];  Service: General;  Laterality: N/A;  laparoscopic ventral hernia repair with mesh  . Cardiometablic testing  05/29/6008    submaximal effort with peak RER of 0.5, peak VO2 79% predicted; HR peak up to 78%; PVC was 55% predicted, PEV1 39% predicted; PEV1/VC ratio was reduced; normal vital capacity; DLCO was reduced to 63%  . Transthoracic echocardiogram  05/2011    EF=>55%; mild conc LVH; mild mitral annular calcif; mild TR; AV mildly sclerotic & mild AR   Family History  Problem Relation Age of Onset  . Stroke Mother   . Stroke Father   . Heart block Brother   . Bladder Cancer Brother   . Diabetes Brother   . Stroke Brother    History  Substance Use Topics  . Smoking status: Former Smoker -- 1.00 packs/day for 20 years    Types: Cigarettes    Quit date: 09/11/1988  . Smokeless tobacco: Never Used  . Alcohol Use: No   OB History    No data available     Review of Systems  All other systems reviewed and are negative.     Allergies  Ace inhibitors and Sulfur  Home Medications   Prior to Admission medications   Medication Sig Start Date End Date Taking? Authorizing Provider  albuterol (PROVENTIL HFA;VENTOLIN HFA) 108 (90  BASE) MCG/ACT inhaler Inhale 2 puffs into the lungs every 6 (six) hours as needed for wheezing or shortness of breath. 05/23/13  Yes Geoffery Lyons, MD  calcium citrate-vitamin D (CITRACAL+D) 315-200 MG-UNIT per tablet Take 1 tablet by mouth daily.   Yes Historical Provider, MD  diltiazem (CARDIZEM CD) 180 MG 24 hr capsule Take 180 mg by mouth daily.   Yes Historical Provider, MD  hydrochlorothiazide (HYDRODIURIL) 25 MG tablet Take 1 tablet (25 mg total) by mouth daily. 07/05/13  Yes Pixie Casino, MD  levothyroxine (SYNTHROID, LEVOTHROID) 137 MCG tablet Take 68.5-137 mcg by mouth daily. Take half tablet on Monday, Wednesday, and Friday. Take 1 tablet all other days   Yes Historical Provider, MD  losartan (COZAAR) 100 MG tablet TAKE 1  TABLET BY MOUTH DAILY 06/18/13  Yes Pixie Casino, MD  Multiple Vitamins-Minerals (MULTIVITAMIN WITH MINERALS) tablet Take 1 tablet by mouth daily.   Yes Historical Provider, MD  naphazoline-glycerin (CLEAR EYES) 0.012-0.2 % SOLN Place 1-2 drops into both eyes 2 (two) times daily.   Yes Historical Provider, MD  warfarin (COUMADIN) 2.5 MG tablet Take 1 to 1 & 1/2 tablets by mouth daily as directed Patient taking differently: Take 2.5 mg by mouth daily.  07/22/13  Yes Kristin Alvstad, RPH-CPP   BP 108/49 mmHg  Pulse 70  Temp(Src) 98.2 F (36.8 C) (Oral)  Resp 18  SpO2 94% Physical Exam  Constitutional: She is oriented to person, place, and time. She appears well-developed and well-nourished.  HENT:  Head: Normocephalic and atraumatic.  Right Ear: External ear normal.  Left Ear: External ear normal.  Eyes: Conjunctivae and EOM are normal. Pupils are equal, round, and reactive to light.  Neck: Normal range of motion and phonation normal. Neck supple.  Cardiovascular: Normal rate, regular rhythm and normal heart sounds.   Pulmonary/Chest: Effort normal and breath sounds normal. She exhibits no bony tenderness.  Abdominal: Soft. There is no tenderness.  Musculoskeletal: Normal range of motion.  Neurological: She is alert and oriented to person, place, and time. No cranial nerve deficit or sensory deficit. She exhibits normal muscle tone. Coordination normal.  Skin: Skin is warm, dry and intact.  Psychiatric: She has a normal mood and affect. Her behavior is normal. Judgment and thought content normal.  Nursing note and vitals reviewed.   ED Course  Procedures (including critical care time)  Medications - No data to display  Patient Vitals for the past 24 hrs:  BP Temp Temp src Pulse Resp SpO2  02/20/14 1530 (!) 108/49 mmHg - - 70 18 -  02/20/14 1515 118/57 mmHg - - 73 15 94 %  02/20/14 1500 117/61 mmHg - - 81 19 95 %  02/20/14 1445 101/61 mmHg - - 74 13 93 %  02/20/14 1430 (!)  119/54 mmHg - - 85 15 98 %  02/20/14 1425 116/66 mmHg - - 86 19 97 %  02/20/14 1416 (!) 106/44 mmHg 98.2 F (36.8 C) Oral 91 18 99 %      Labs Review Labs Reviewed  CBC - Abnormal; Notable for the following:    WBC 12.8 (*)    Hemoglobin 15.6 (*)    MCH 34.5 (*)    All other components within normal limits  BASIC METABOLIC PANEL - Abnormal; Notable for the following:    Sodium 134 (*)    Potassium 3.1 (*)    Chloride 93 (*)    Glucose, Bld 126 (*)    GFR calc non  Af Amer 79 (*)    All other components within normal limits  PROTIME-INR - Abnormal; Notable for the following:    Prothrombin Time 42.0 (*)    INR 4.35 (*)    All other components within normal limits  PRO B NATRIURETIC PEPTIDE  I-STAT TROPOININ, ED    Imaging Review No results found.   EKG Interpretation   Date/Time:  Monday February 20 2014 14:15:08 EST Ventricular Rate:  94 PR Interval:  144 QRS Duration: 74 QT Interval:  370 QTC Calculation: 462 R Axis:   63 Text Interpretation:  Sinus rhythm with frequent Premature ventricular  complexes Possible Left atrial enlargement Possible Anterior infarct , age  undetermined Abnormal ECG Since last tracing Premature ventricular  complexes are new Confirmed by Eulis Foster  MD, Vira Agar ([id number]) on 02/20/2014  4:01:48 PM      MDM   Final diagnoses:  Abdominal pain, epigastric    Nonspecific upper abdominal pain, most likely related to gastritis related to use of oral antibiotic.  Differential diagnosis includes gallbladder disease, cardiac disease, and GERD.  Nursing Notes Reviewed/ Care Coordinated, and agree without changes. Applicable Imaging Reviewed.  Interpretation of Laboratory Data incorporated into ED treatment  Plan: Care to Dr. Dina Rich to evaluate after return of ultrasound imaging.    Richarda Blade, MD 02/21/14 [phone]

## 2014-02-20 NOTE — ED Notes (Signed)
Pt having chest pain/abdominal pain severe started this am. sts squeezing in her chest. sts SOB. sts recently treated for URI.

## 2014-02-20 NOTE — Discharge Instructions (Signed)
Biliary Colic  °Biliary colic is a steady or irregular pain in the upper abdomen. It is usually under the right side of the rib cage. It happens when gallstones interfere with the normal flow of bile from the gallbladder. Bile is a liquid that helps to digest fats. Bile is made in the liver and stored in the gallbladder. When you eat a meal, bile passes from the gallbladder through the cystic duct and the common bile duct into the small intestine. There, it mixes with partially digested food. If a gallstone blocks either of these ducts, the normal flow of bile is blocked. The muscle cells in the bile duct contract forcefully to try to move the stone. This causes the pain of biliary colic.  °SYMPTOMS  °· A person with biliary colic usually complains of pain in the upper abdomen. This pain can be: °¨ In the center of the upper abdomen just below the breastbone. °¨ In the upper-right part of the abdomen, near the gallbladder and liver. °¨ Spread back toward the right shoulder blade. °· Nausea and vomiting. °· The pain usually occurs after eating. °· Biliary colic is usually triggered by the digestive system's demand for bile. The demand for bile is high after fatty meals. Symptoms can also occur when a person who has been fasting suddenly eats a very large meal. Most episodes of biliary colic pass after 1 to 5 hours. After the most intense pain passes, your abdomen may continue to ache mildly for about 24 hours. °DIAGNOSIS  °After you describe your symptoms, your caregiver will perform a physical exam. He or she will pay attention to the upper right portion of your belly (abdomen). This is the area of your liver and gallbladder. An ultrasound will help your caregiver look for gallstones. Specialized scans of the gallbladder may also be done. Blood tests may be done, especially if you have fever or if your pain persists. °PREVENTION  °Biliary colic can be prevented by controlling the risk factors for gallstones. Some of  these risk factors, such as heredity, increasing age, and pregnancy are a normal part of life. Obesity and a high-fat diet are risk factors you can change through a healthy lifestyle. Women going through menopause who take hormone replacement therapy (estrogen) are also more likely to develop biliary colic. °TREATMENT  °· Pain medication may be prescribed. °· You may be encouraged to eat a fat-free diet. °· If the first episode of biliary colic is severe, or episodes of colic keep retuning, surgery to remove the gallbladder (cholecystectomy) is usually recommended. This procedure can be done through small incisions using an instrument called a laparoscope. The procedure often requires a brief stay in the hospital. Some people can leave the hospital the same day. It is the most widely used treatment in people troubled by painful gallstones. It is effective and safe, with no complications in more than 90% of cases. °· If surgery cannot be done, medication that dissolves gallstones may be used. This medication is expensive and can take months or years to work. Only small stones will dissolve. °· Rarely, medication to dissolve gallstones is combined with a procedure called shock-wave lithotripsy. This procedure uses carefully aimed shock waves to break up gallstones. In many people treated with this procedure, gallstones form again within a few years. °PROGNOSIS  °If gallstones block your cystic duct or common bile duct, you are at risk for repeated episodes of biliary colic. There is also a 25% chance that you will develop   a gallbladder infection(acute cholecystitis), or some other complication of gallstones within 10 to 20 years. If you have surgery, schedule it at a time that is convenient for you and at a time when you are not sick. °HOME CARE INSTRUCTIONS  °· Drink plenty of clear fluids. °· Avoid fatty, greasy or fried foods, or any foods that make your pain worse. °· Take medications as directed. °SEEK MEDICAL  CARE IF:  °· You develop a fever over 100.5° F (38.1° C). °· Your pain gets worse over time. °· You develop nausea that prevents you from eating and drinking. °· You develop vomiting. °SEEK IMMEDIATE MEDICAL CARE IF:  °· You have continuous or severe belly (abdominal) pain which is not relieved with medications. °· You develop nausea and vomiting which is not relieved with medications. °· You have symptoms of biliary colic and you suddenly develop a fever and shaking chills. This may signal cholecystitis. Call your caregiver immediately. °· You develop a yellow color to your skin or the white part of your eyes (jaundice). °Document Released: 08/18/2005 Document Revised: 06/09/2011 Document Reviewed: 10/28/2007 °Exit[employer]® Patient Information ©2015 [employer]. This information is not intended to replace advice given to you by your health care provider. Make sure you discuss any questions you have with your health care provider. ° °

## 2014-02-20 NOTE — ED Provider Notes (Signed)
Patient signed out by Dr. Eulis Foster pending imaging.  Ultrasound shows cholelithiasis without evidence of cholecystitis. On recheck, patient is comfortable and has no complaints. She is able to tolerate by mouth. She knows that she has kidney stones and has are to be referred to Dr. Marlou Starks for possible gallbladder removal. Abdominal exam is benign. Discussed with patient discharged home with a short course of pain medication. She needs to follow-up with Dr. Marlou Starks. Will not prescribe outpatient PPI given the patient is on Coumadin and there is possible interaction.  Results for orders placed or performed during the hospital encounter of 02/20/14  CBC  Result Value Ref Range   WBC 12.8 (H) 4.0 - 10.5 K/uL   RBC 4.52 3.87 - 5.11 MIL/uL   Hemoglobin 15.6 (H) 12.0 - 15.0 g/dL   HCT 43.5 36.0 - 46.0 %   MCV 96.2 78.0 - 100.0 fL   MCH 34.5 (H) 26.0 - 34.0 pg   MCHC 35.9 30.0 - 36.0 g/dL   RDW 13.8 11.5 - 15.5 %   Platelets 324 150 - 400 K/uL  Basic metabolic panel  Result Value Ref Range   Sodium 134 (L) 137 - 147 mEq/L   Potassium 3.1 (L) 3.7 - 5.3 mEq/L   Chloride 93 (L) 96 - 112 mEq/L   CO2 27 19 - 32 mEq/L   Glucose, Bld 126 (H) 70 - 99 mg/dL   BUN 17 6 - 23 mg/dL   Creatinine, Ser 0.65 0.50 - 1.10 mg/dL   Calcium 9.0 8.4 - 10.5 mg/dL   GFR calc non Af Amer 79 (L) >90 mL/min   GFR calc Af Amer >90 >90 mL/min   Anion gap 14 5 - 15  BNP (order ONLY if patient complains of dyspnea/SOB AND you have documented it for THIS visit)  Result Value Ref Range   Pro B Natriuretic peptide (BNP) 121.6 0 - 450 pg/mL  Protime-INR (if pt is taking Coumadin)  Result Value Ref Range   Prothrombin Time 42.0 (H) 11.6 - 15.2 seconds   INR 4.35 (H) 0.00 - 1.49  Hepatic function panel  Result Value Ref Range   Total Protein 6.4 6.0 - 8.3 g/dL   Albumin 3.5 3.5 - 5.2 g/dL   AST 24 0 - 37 U/L   ALT 17 0 - 35 U/L   Alkaline Phosphatase 80 39 - 117 U/L   Total Bilirubin 0.8 0.3 - 1.2 mg/dL   Bilirubin, Direct  <0.2 0.0 - 0.3 mg/dL   Indirect Bilirubin NOT CALCULATED 0.3 - 0.9 mg/dL  Lipase, blood  Result Value Ref Range   Lipase 34 11 - 59 U/L  I-stat troponin, ED (not at [facility])  Result Value Ref Range   Troponin i, poc 0.02 0.00 - 0.08 ng/mL   Comment 3           Dg Chest 2 View  02/20/2014   CLINICAL DATA:  Severe chest and abdomen pain.  Shortness of breath.  EXAM: CHEST  2 VIEW  COMPARISON:  Chest x-rays dated 05/20/2013 and 05/17/2013  FINDINGS: The heart size and pulmonary vascularity are normal. No infiltrates or effusions. The lungs are hyperinflated with flattening of the diaphragm consistent with emphysema. There is a focal eventration of the posterior aspect of the right hemidiaphragm, stable.  There is diffuse osteopenia. There is a new slight compression deformity of the superior endplate of T8. Old bilateral rib fractures.  IMPRESSION: New slight compression fracture of the superior endplate of T8 since 40/34/7425.  COPD.   Electronically Signed   By: Rozetta Nunnery M.D.   On: 02/20/2014 16:13   US Abdomen Complete  02/20/2014   CLINICAL DATA:  Abdominal pain.  History of gallstones.  EXAM: ULTRASOUND ABDOMEN COMPLETE  COMPARISON:  CT 06/17/2013.  FINDINGS: Gallbladder: 1.4 cm gallstone. No sonographic Murphy sign. Gallbladder wall under 3 mm, normal.  Common bile duct: Diameter: 2 mm, normal.  Liver: No focal lesion identified. Within normal limits in parenchymal echogenicity.  IVC: No abnormality visualized.  Pancreas: Visualized portion unremarkable.  Spleen: 7.9 cm.  Normal echotexture.  Right Kidney: Length: 10.9 cm. Echogenicity within normal limits. No mass or hydronephrosis visualized.  Left Kidney: Length: 9.9 cm. Echogenicity within normal limits. No mass or hydronephrosis visualized.  Abdominal aorta: No aneurysm visualized.  Other findings: None.  IMPRESSION: Cholelithiasis without cholecystitis.   Electronically Signed   By: Dereck Ligas M.D.   On: 02/20/2014 18:48   Dg  Epidurography  01/23/2014   CLINICAL DATA:  Lumbosacral spondylosis without myelopathy. Right thigh pain. Prior epidural injection provided complete pain relief but only lasted for 2 weeks.  FLUOROSCOPY TIME:  0 min 45 seconds  0.226 Gy*cm2  PROCEDURE: The procedure, risks, benefits, and alternatives were explained to the patient. Questions regarding the procedure were encouraged and answered. The patient understands and consents to the procedure.  LUMBAR EPIDURAL INJECTION:  An interlaminar approach was performed on the right at L2-3. The overlying skin was cleansed and anesthetized. A 20 gauge spinal needle was advanced using loss-of-resistance technique.  DIAGNOSTIC EPIDURAL INJECTION:  Injection of Omnipaque 180 shows a good epidural pattern with spread above and below the level of needle placement, primarily on the right. No vascular opacification is seen.  THERAPEUTIC EPIDURAL INJECTION:  120 mg of Depo-Medrol mixed with 3 mL of 1% lidocaine were instilled. The procedure was well-tolerated, and the patient was discharged thirty minutes following the injection in good condition.  COMPLICATIONS: None  IMPRESSION: Technically successful second lumbar interlaminar epidural injection at L2-3.   Electronically Signed   By: Logan Bores   On: 01/23/2014 13:15       Merryl Hacker, MD 02/20/14 1941

## 2014-02-22 ENCOUNTER — Telehealth: Payer: Self-pay | Admitting: Internal Medicine

## 2014-02-22 NOTE — Telephone Encounter (Signed)
Spoke with pt, aware we can not give pain medicine. Referred to primary care.

## 2014-02-22 NOTE — Telephone Encounter (Signed)
Pt's husband called in stating that pt was in the hospital for gal stones and she receieved some pain pills for her pain. She will be running out of pills before Monday which will be when her PCP will be able to see her. He would like to know what to do in the mean time because pt is in extreme pain. Please call  Thanks

## 2014-02-27 ENCOUNTER — Ambulatory Visit (INDEPENDENT_AMBULATORY_CARE_PROVIDER_SITE_OTHER): Payer: Medicare Other | Admitting: Pharmacist Clinician (PhC)/ Clinical Pharmacy Specialist

## 2014-02-27 DIAGNOSIS — I4891 Unspecified atrial fibrillation: Secondary | ICD-10-CM

## 2014-02-27 DIAGNOSIS — I480 Paroxysmal atrial fibrillation: Secondary | ICD-10-CM

## 2014-02-27 DIAGNOSIS — Z7901 Long term (current) use of anticoagulants: Secondary | ICD-10-CM

## 2014-02-27 LAB — POCT INR: INR: 4.9

## 2014-03-10 ENCOUNTER — Ambulatory Visit (INDEPENDENT_AMBULATORY_CARE_PROVIDER_SITE_OTHER): Payer: Medicare Other | Admitting: Pharmacist Clinician (PhC)/ Clinical Pharmacy Specialist

## 2014-03-10 DIAGNOSIS — I4891 Unspecified atrial fibrillation: Secondary | ICD-10-CM

## 2014-03-10 DIAGNOSIS — I480 Paroxysmal atrial fibrillation: Secondary | ICD-10-CM

## 2014-03-10 DIAGNOSIS — Z7901 Long term (current) use of anticoagulants: Secondary | ICD-10-CM

## 2014-03-10 LAB — POCT INR: INR: 2.4

## 2014-03-20 ENCOUNTER — Other Ambulatory Visit (INDEPENDENT_AMBULATORY_CARE_PROVIDER_SITE_OTHER): Payer: Self-pay | Admitting: General Surgery

## 2014-04-02 ENCOUNTER — Other Ambulatory Visit: Payer: Self-pay | Admitting: Internal Medicine

## 2014-04-03 NOTE — Telephone Encounter (Signed)
Rx(s) sent to pharmacy electronically.  

## 2014-04-04 NOTE — Pre-Procedure Instructions (Signed)
BELVA KOZIEL  04/04/2014   Your procedure is scheduled on:  04/07/14  Report to [facility] Admitting at 11 AM.  Call this number if you have problems the morning of surgery: [phone]   Remember:   Do not eat food or drink liquids after midnight.   Take these medicines the morning of surgery with A SIP OF WATER: all inhalers,diltiazem,hydrocodone,synthroid   Do not wear jewelry, make-up or nail polish.  Do not wear lotions, powders, or perfumes. You may wear deodorant.  Do not shave 48 hours prior to surgery. Men may shave face and neck.  Do not bring valuables to the hospital.  [facility] is not responsible                  for any belongings or valuables.               Contacts, dentures or bridgework may not be worn into surgery.  Leave suitcase in the car. After surgery it may be brought to your room.  For patients admitted to the hospital, discharge time is determined by your                treatment team.               Patients discharged the day of surgery will not be allowed to drive  home.  Name and phone number of your driver:   Special Instructions: Incentive Spirometry - Practice and bring it with you on the day of surgery.   Please read over the following fact sheets that you were given: Pain Booklet, Coughing and Deep Breathing and Surgical Site Infection Prevention

## 2014-04-05 ENCOUNTER — Encounter (HOSPITAL_COMMUNITY): Payer: Self-pay

## 2014-04-05 ENCOUNTER — Encounter (HOSPITAL_COMMUNITY)
Admission: RE | Admit: 2014-04-05 | Discharge: 2014-04-05 | Disposition: A | Discharge status: Home / Self Care | Payer: Medicare HMO | Source: Ambulatory Visit | Attending: General Surgery | Admitting: General Surgery

## 2014-04-05 DIAGNOSIS — E039 Hypothyroidism, unspecified: Secondary | ICD-10-CM | POA: Diagnosis not present

## 2014-04-05 DIAGNOSIS — I10 Essential (primary) hypertension: Secondary | ICD-10-CM | POA: Diagnosis not present

## 2014-04-05 DIAGNOSIS — Z23 Encounter for immunization: Secondary | ICD-10-CM | POA: Diagnosis not present

## 2014-04-05 DIAGNOSIS — Z87891 Personal history of nicotine dependence: Secondary | ICD-10-CM | POA: Diagnosis not present

## 2014-04-05 DIAGNOSIS — K219 Gastro-esophageal reflux disease without esophagitis: Secondary | ICD-10-CM | POA: Diagnosis not present

## 2014-04-05 DIAGNOSIS — M1990 Unspecified osteoarthritis, unspecified site: Secondary | ICD-10-CM | POA: Diagnosis not present

## 2014-04-05 DIAGNOSIS — Z7901 Long term (current) use of anticoagulants: Secondary | ICD-10-CM | POA: Diagnosis not present

## 2014-04-05 DIAGNOSIS — Z90710 Acquired absence of both cervix and uterus: Secondary | ICD-10-CM | POA: Diagnosis not present

## 2014-04-05 DIAGNOSIS — I739 Peripheral vascular disease, unspecified: Secondary | ICD-10-CM | POA: Diagnosis not present

## 2014-04-05 DIAGNOSIS — J449 Chronic obstructive pulmonary disease, unspecified: Secondary | ICD-10-CM | POA: Diagnosis not present

## 2014-04-05 DIAGNOSIS — K8020 Calculus of gallbladder without cholecystitis without obstruction: Secondary | ICD-10-CM | POA: Diagnosis present

## 2014-04-05 DIAGNOSIS — G709 Myoneural disorder, unspecified: Secondary | ICD-10-CM | POA: Diagnosis not present

## 2014-04-05 DIAGNOSIS — I4891 Unspecified atrial fibrillation: Secondary | ICD-10-CM | POA: Diagnosis not present

## 2014-04-05 HISTORY — DX: Cardiac arrhythmia, unspecified: I49.9

## 2014-04-05 LAB — BASIC METABOLIC PANEL
Anion gap: 7 (ref 5–15)
BUN: 11 mg/dL (ref 6–23)
CO2: 26 mmol/L (ref 19–32)
Calcium: 9.2 mg/dL (ref 8.4–10.5)
Chloride: 102 mEq/L (ref 96–112)
Creatinine, Ser: 0.84 mg/dL (ref 0.50–1.10)
GFR calc Af Amer: 71 mL/min — ABNORMAL LOW (ref >90)
GFR calc non Af Amer: 62 mL/min — ABNORMAL LOW (ref >90)
GLUCOSE: 92 mg/dL (ref 70–99)
Potassium: 3.9 mmol/L (ref 3.5–5.1)
SODIUM: 135 mmol/L (ref 135–145)

## 2014-04-05 LAB — CBC
HCT: 41.7 % (ref 36.0–46.0)
Hemoglobin: 14.5 g/dL (ref 12.0–15.0)
MCH: 33.6 pg (ref 26.0–34.0)
MCHC: 34.8 g/dL (ref 30.0–36.0)
MCV: 96.8 fL (ref 78.0–100.0)
Platelets: 256 10*3/uL (ref 150–400)
RBC: 4.31 MIL/uL (ref 3.87–5.11)
RDW: 13.9 % (ref 11.5–15.5)
WBC: 6.7 10*3/uL (ref 4.0–10.5)

## 2014-04-05 LAB — APTT: aPTT: 33 seconds (ref 24–37)

## 2014-04-05 LAB — PROTIME-INR
INR: 1.3 (ref 0.00–1.49)
PROTHROMBIN TIME: 16.3 s — AB (ref 11.6–15.2)

## 2014-04-05 NOTE — Progress Notes (Signed)
Pt stopped coumadin 03/31/14.  Pt stated Dr Marlou Starks stated she was cleared for surgery.. Will f/u with Shelby Dubin

## 2014-04-06 MED ORDER — CHLORHEXIDINE GLUCONATE 4 % EX LIQD
1.0000 "application " | Freq: Once | CUTANEOUS | Status: DC
  Filled 2014-04-06: qty 15

## 2014-04-06 MED ORDER — CEFAZOLIN SODIUM-DEXTROSE 2-3 GM-% IV SOLR
2.0000 g | INTRAVENOUS | Status: AC
  Administered 2014-04-07: 2 g via INTRAVENOUS
  Filled 2014-04-06: qty 50

## 2014-04-06 NOTE — Anesthesia Preprocedure Evaluation (Addendum)
Anesthesia Evaluation  Patient identified by MRN, date of birth, ID band Patient awake    Reviewed: Allergy & Precautions, NPO status , Patient's Chart, lab work & pertinent test results  History of Anesthesia Complications (+) PONV and history of anesthetic complications  Airway Mallampati: II  TM Distance: >3 FB Neck ROM: Full    Dental  (+) Teeth Intact   Pulmonary shortness of breath, COPDformer smoker,  breath sounds clear to auscultation        Cardiovascular hypertension, Pt. on medications + Peripheral Vascular Disease + dysrhythmias Atrial Fibrillation Rhythm:Regular     Neuro/Psych  Neuromuscular disease    GI/Hepatic Neg liver ROS, hiatal hernia, GERD-  Medicated and Controlled,  Endo/Other  Hypothyroidism   Renal/GU negative Renal ROS     Musculoskeletal  (+) Arthritis -,   Abdominal   Peds  Hematology negative hematology ROS (+)   Anesthesia Other Findings   Reproductive/Obstetrics                           Anesthesia Physical Anesthesia Plan  ASA: III  Anesthesia Plan: General   Post-op Pain Management:    Induction: Intravenous  Airway Management Planned: Oral ETT  Additional Equipment: None  Intra-op Plan:   Post-operative Plan: Extubation in OR  Informed Consent: I have reviewed the patients History and Physical, chart, labs and discussed the procedure including the risks, benefits and alternatives for the proposed anesthesia with the patient or authorized representative who has indicated his/her understanding and acceptance.   Dental advisory given  Plan Discussed with: Surgeon and CRNA  Anesthesia Plan Comments:         Anesthesia Quick Evaluation

## 2014-04-06 NOTE — Progress Notes (Signed)
Anesthesia Chart Review: Patient is a 81 year old female scheduled for cholecystectomy on 04/07/14 by Dr. Marlou Starks.  History includes former smoker, afib/PAF, COPD, HTN, hypothyroidism, osteoporosis, anxiety, GERD, hiatal hernia, peripheral neuropathy, post-operative N/V (requires anti-emetics; did well with 2103 surgery), mild AI by 2013 echo. Last hospitalization for COPD exacerbation with CAP 05/2013. PCP is Dr. Reynaldo Minium.  Pulmonologist is Dr. Lamonte Sakai. Cardiologist is Dr. Debara Pickett, who cleared patient for surgery, but back in 09/2013. She postponed surgery due to work-up for back pain issues and scheduled vacation.. She reports being instructed to hold Coumadin starting 03/31/14.  Meds include Norco, MVI, albuterol, Citracal, Cardizem, HCTZ, levothyroxine, losartan, Coumadin.  02/20/14 EKG: SR with PVCs, possible LAE, possible anterior infarct (age undetermined). PVCs are new since last tracing.  She underwent CPST in 05/24/12: Submaximal effort with peak RER of 0.5, peak VO2 79% predicted; HR peak up to 78%; PVC was 55% predicted, PEV1 39% predicted; PEV1/VC ratio was reduced; normal vital capacity; DLCO was reduced to 63%.  According to Dr. Lysbeth Penner notes scanned under the Media tab,"...the peak VO2 was still 79% predicted suggesting that she still has good cardio metabolic circulation. Heart rate at peak went up to 78%, and there was no evidence of ischemia as far as the VO2 curve was concerned..." She was referred to pulmonology and trialled on Symbicort and Advair without response. Spiriva was started in 05/2013, although she only initially felt some benefit.  Currently she is only on SABA PRN.   Follow-up 05/31/13 PFT: FVC 2.10 (91%), FEV1 1.00 (58%), DLCO 15.19 (66%).  Echo 05/28/11: Mild concentric LVH, normal LV systolic function, findings suggestive of impaired LV relaxation, trace MR, mild mitral annular calcification, mild TR, normal RVSP, AV mildly sclerotic with mild AR.  Nuclear stress test 11/19/10:  Normal myocardial perfusion. Non-gated.  02/20/14 CXR: New slight compression fracture of the superior endplate of T8 since 03/54/6568. COPD.  Preoperative labs noted.  I reviewed with anesthesiologist Dr. Marcie Bal.  Recommend to confirm that patient is not having any new or progressive cardiopulmonary symptoms and if not, would anticipate that she could proceed as planned. I called and spoke with patient.  She denied chest pain, palpitations, SOB at rest. She has chronic exertional dyspnea which she feels is stable. She is not on Spiriva any longer because it was not helping her symptoms and was making her mouth extremely dry.  She does not require home O2.  She uses albuterol 1-2 times per day as needed, and denied any increased use.  She is still able to clean her house, but may have to stop intermittently to rest, but then has not changed in some time now. She feels at her baseline from a cardiopulmonary standpoint.  As above, would anticipate that she could proceed as planned.  [facility] Short Stay Center/Anesthesiology Phone [phone] 04/06/2014 4:42 PM

## 2014-04-07 ENCOUNTER — Encounter (HOSPITAL_COMMUNITY): Payer: Self-pay | Admitting: *Deleted

## 2014-04-07 ENCOUNTER — Ambulatory Visit (HOSPITAL_COMMUNITY)
Admission: RE | Admit: 2014-04-07 | Discharge: 2014-04-08 | Disposition: A | Discharge status: Home / Self Care | Payer: Medicare HMO | Source: Ambulatory Visit | Attending: General Surgery | Admitting: General Surgery

## 2014-04-07 ENCOUNTER — Encounter (HOSPITAL_COMMUNITY)
Admission: RE | Disposition: A | Discharge status: Home / Self Care | Payer: Self-pay | Source: Ambulatory Visit | Attending: General Surgery

## 2014-04-07 ENCOUNTER — Ambulatory Visit (HOSPITAL_COMMUNITY): Payer: Medicare HMO | Admitting: Certified Registered Nurse Anesthetist

## 2014-04-07 ENCOUNTER — Ambulatory Visit (HOSPITAL_COMMUNITY): Payer: Medicare HMO | Admitting: Vascular Surgery

## 2014-04-07 ENCOUNTER — Ambulatory Visit (HOSPITAL_COMMUNITY): Payer: Medicare HMO

## 2014-04-07 DIAGNOSIS — Z7901 Long term (current) use of anticoagulants: Secondary | ICD-10-CM | POA: Insufficient documentation

## 2014-04-07 DIAGNOSIS — J449 Chronic obstructive pulmonary disease, unspecified: Secondary | ICD-10-CM | POA: Diagnosis not present

## 2014-04-07 DIAGNOSIS — I4891 Unspecified atrial fibrillation: Secondary | ICD-10-CM | POA: Insufficient documentation

## 2014-04-07 DIAGNOSIS — E039 Hypothyroidism, unspecified: Secondary | ICD-10-CM | POA: Insufficient documentation

## 2014-04-07 DIAGNOSIS — M1990 Unspecified osteoarthritis, unspecified site: Secondary | ICD-10-CM | POA: Insufficient documentation

## 2014-04-07 DIAGNOSIS — Z90710 Acquired absence of both cervix and uterus: Secondary | ICD-10-CM | POA: Insufficient documentation

## 2014-04-07 DIAGNOSIS — K8050 Calculus of bile duct without cholangitis or cholecystitis without obstruction: Secondary | ICD-10-CM

## 2014-04-07 DIAGNOSIS — K219 Gastro-esophageal reflux disease without esophagitis: Secondary | ICD-10-CM | POA: Insufficient documentation

## 2014-04-07 DIAGNOSIS — G709 Myoneural disorder, unspecified: Secondary | ICD-10-CM | POA: Insufficient documentation

## 2014-04-07 DIAGNOSIS — K8020 Calculus of gallbladder without cholecystitis without obstruction: Secondary | ICD-10-CM | POA: Diagnosis not present

## 2014-04-07 DIAGNOSIS — I10 Essential (primary) hypertension: Secondary | ICD-10-CM | POA: Diagnosis not present

## 2014-04-07 DIAGNOSIS — I739 Peripheral vascular disease, unspecified: Secondary | ICD-10-CM | POA: Insufficient documentation

## 2014-04-07 DIAGNOSIS — Z87891 Personal history of nicotine dependence: Secondary | ICD-10-CM | POA: Insufficient documentation

## 2014-04-07 DIAGNOSIS — Z23 Encounter for immunization: Secondary | ICD-10-CM | POA: Insufficient documentation

## 2014-04-07 HISTORY — PX: CHOLECYSTECTOMY: SHX55

## 2014-04-07 SURGERY — LAPAROSCOPIC CHOLECYSTECTOMY WITH INTRAOPERATIVE CHOLANGIOGRAM
Anesthesia: General

## 2014-04-07 MED ORDER — NEOSTIGMINE METHYLSULFATE 10 MG/10ML IV SOLN
INTRAVENOUS | Status: DC | PRN
  Administered 2014-04-07: 2.5 mg via INTRAVENOUS

## 2014-04-07 MED ORDER — PROPOFOL 10 MG/ML IV BOLUS
INTRAVENOUS | Status: AC
  Filled 2014-04-07: qty 20

## 2014-04-07 MED ORDER — FENTANYL CITRATE 0.05 MG/ML IJ SOLN
INTRAMUSCULAR | Status: AC
  Filled 2014-04-07: qty 5

## 2014-04-07 MED ORDER — LEVOTHYROXINE SODIUM 137 MCG PO TABS: 68.5000 ug | ORAL_TABLET | Freq: Every day | ORAL | Status: DC

## 2014-04-07 MED ORDER — MORPHINE SULFATE 2 MG/ML IJ SOLN: 2.0000 mg | INTRAMUSCULAR | Status: DC | PRN

## 2014-04-07 MED ORDER — NEOSTIGMINE METHYLSULFATE 10 MG/10ML IV SOLN
INTRAVENOUS | Status: AC
  Filled 2014-04-07: qty 1

## 2014-04-07 MED ORDER — ACETAMINOPHEN 325 MG PO TABS: 325.0000 mg | ORAL_TABLET | ORAL | Status: DC | PRN

## 2014-04-07 MED ORDER — LACTATED RINGERS IV SOLN
INTRAVENOUS | Status: DC
  Administered 2014-04-07: 12:00:00 via INTRAVENOUS

## 2014-04-07 MED ORDER — KCL IN DEXTROSE-NACL 20-5-0.9 MEQ/L-%-% IV SOLN
INTRAVENOUS | Status: DC
  Administered 2014-04-07: 18:00:00 via INTRAVENOUS
  Filled 2014-04-07: qty 1000

## 2014-04-07 MED ORDER — HYDROMORPHONE HCL 1 MG/ML IJ SOLN
INTRAMUSCULAR | Status: AC
  Filled 2014-04-07: qty 1

## 2014-04-07 MED ORDER — FENTANYL CITRATE 0.05 MG/ML IJ SOLN
INTRAMUSCULAR | Status: AC
  Filled 2014-04-07: qty 2

## 2014-04-07 MED ORDER — DEXAMETHASONE SODIUM PHOSPHATE 4 MG/ML IJ SOLN
INTRAMUSCULAR | Status: DC | PRN
  Administered 2014-04-07: 4 mg via INTRAVENOUS

## 2014-04-07 MED ORDER — ACETAMINOPHEN 160 MG/5ML PO SOLN: 325.0000 mg | ORAL | Status: DC | PRN

## 2014-04-07 MED ORDER — PNEUMOCOCCAL VAC POLYVALENT 25 MCG/0.5ML IJ INJ
0.5000 mL | INJECTION | INTRAMUSCULAR | Status: AC
  Administered 2014-04-08: 0.5 mL via INTRAMUSCULAR

## 2014-04-07 MED ORDER — PROPOFOL 10 MG/ML IV BOLUS
INTRAVENOUS | Status: DC | PRN
  Administered 2014-04-07: 70 mg via INTRAVENOUS
  Administered 2014-04-07: 30 mg via INTRAVENOUS

## 2014-04-07 MED ORDER — DILTIAZEM HCL ER COATED BEADS 180 MG PO CP24
180.0000 mg | ORAL_CAPSULE | Freq: Every day | ORAL | Status: DC
  Administered 2014-04-08: 180 mg via ORAL
  Filled 2014-04-07: qty 1

## 2014-04-07 MED ORDER — ONDANSETRON HCL 4 MG/2ML IJ SOLN
INTRAMUSCULAR | Status: DC | PRN
  Administered 2014-04-07: 4 mg via INTRAVENOUS

## 2014-04-07 MED ORDER — FENTANYL CITRATE 0.05 MG/ML IJ SOLN
25.0000 ug | INTRAMUSCULAR | Status: DC | PRN
  Administered 2014-04-07: 25 ug via INTRAVENOUS
  Administered 2014-04-07: 50 ug via INTRAVENOUS

## 2014-04-07 MED ORDER — FENTANYL CITRATE 0.05 MG/ML IJ SOLN
INTRAMUSCULAR | Status: DC | PRN
  Administered 2014-04-07: 75 ug via INTRAVENOUS
  Administered 2014-04-07: 25 ug via INTRAVENOUS
  Administered 2014-04-07: 50 ug via INTRAVENOUS

## 2014-04-07 MED ORDER — GLYCOPYRROLATE 0.2 MG/ML IJ SOLN
INTRAMUSCULAR | Status: DC | PRN
  Administered 2014-04-07: 0.3 mg via INTRAVENOUS

## 2014-04-07 MED ORDER — GLYCOPYRROLATE 0.2 MG/ML IJ SOLN
INTRAMUSCULAR | Status: AC
  Filled 2014-04-07: qty 2

## 2014-04-07 MED ORDER — BUPIVACAINE-EPINEPHRINE (PF) 0.25% -1:200000 IJ SOLN
INTRAMUSCULAR | Status: AC
  Filled 2014-04-07: qty 30

## 2014-04-07 MED ORDER — ONDANSETRON HCL 4 MG/2ML IJ SOLN: 4.0000 mg | Freq: Four times a day (QID) | INTRAMUSCULAR | Status: DC | PRN

## 2014-04-07 MED ORDER — LEVOTHYROXINE SODIUM 137 MCG PO TABS: 68.5000 ug | ORAL_TABLET | ORAL | Status: DC

## 2014-04-07 MED ORDER — PROPOFOL INFUSION 10 MG/ML OPTIME
INTRAVENOUS | Status: DC | PRN
  Administered 2014-04-07: 25 ug/kg/min via INTRAVENOUS

## 2014-04-07 MED ORDER — ONDANSETRON HCL 4 MG PO TABS: 4.0000 mg | ORAL_TABLET | Freq: Four times a day (QID) | ORAL | Status: DC | PRN

## 2014-04-07 MED ORDER — ALBUTEROL SULFATE (2.5 MG/3ML) 0.083% IN NEBU: 2.5000 mg | INHALATION_SOLUTION | Freq: Four times a day (QID) | RESPIRATORY_TRACT | Status: DC | PRN

## 2014-04-07 MED ORDER — IOHEXOL 300 MG/ML  SOLN
INTRAMUSCULAR | Status: DC | PRN
  Administered 2014-04-07: 8 mL

## 2014-04-07 MED ORDER — ROCURONIUM BROMIDE 100 MG/10ML IV SOLN
INTRAVENOUS | Status: DC | PRN
  Administered 2014-04-07: 30 mg via INTRAVENOUS
  Administered 2014-04-07: 10 mg via INTRAVENOUS

## 2014-04-07 MED ORDER — ONDANSETRON HCL 4 MG/2ML IJ SOLN
INTRAMUSCULAR | Status: AC
  Filled 2014-04-07: qty 2

## 2014-04-07 MED ORDER — MIDAZOLAM HCL 2 MG/2ML IJ SOLN
INTRAMUSCULAR | Status: AC
  Filled 2014-04-07: qty 2

## 2014-04-07 MED ORDER — LACTATED RINGERS IV SOLN
INTRAVENOUS | Status: DC | PRN
  Administered 2014-04-07 (×2): via INTRAVENOUS

## 2014-04-07 MED ORDER — WARFARIN SODIUM 2.5 MG PO TABS
2.5000 mg | ORAL_TABLET | Freq: Every day | ORAL | Status: DC
  Administered 2014-04-07: 2.5 mg via ORAL
  Filled 2014-04-07 (×2): qty 1

## 2014-04-07 MED ORDER — WARFARIN - PHYSICIAN DOSING INPATIENT
Freq: Every day | Status: DC
  Administered 2014-04-07: 18:00:00

## 2014-04-07 MED ORDER — PHENYLEPHRINE HCL 10 MG/ML IJ SOLN
10.0000 mg | INTRAVENOUS | Status: DC | PRN
  Administered 2014-04-07: 25 ug/min via INTRAVENOUS

## 2014-04-07 MED ORDER — LOSARTAN POTASSIUM 50 MG PO TABS
100.0000 mg | ORAL_TABLET | Freq: Every day | ORAL | Status: DC
  Administered 2014-04-08: 100 mg via ORAL
  Filled 2014-04-07: qty 2

## 2014-04-07 MED ORDER — HYDROCODONE-ACETAMINOPHEN 5-325 MG PO TABS
1.0000 | ORAL_TABLET | Freq: Four times a day (QID) | ORAL | Status: DC | PRN
  Administered 2014-04-07 – 2014-04-08 (×2): 1 via ORAL
  Filled 2014-04-07 (×2): qty 1

## 2014-04-07 MED ORDER — LIDOCAINE HCL (CARDIAC) 20 MG/ML IV SOLN
INTRAVENOUS | Status: DC | PRN
  Administered 2014-04-07: 60 mg via INTRAVENOUS

## 2014-04-07 MED ORDER — ONDANSETRON HCL 4 MG/2ML IJ SOLN
INTRAMUSCULAR | Status: AC
  Administered 2014-04-07: 4 mg
  Filled 2014-04-07: qty 2

## 2014-04-07 MED ORDER — OXYCODONE HCL 5 MG PO TABS: 5.0000 mg | ORAL_TABLET | Freq: Once | ORAL | Status: DC | PRN

## 2014-04-07 MED ORDER — HYDROCHLOROTHIAZIDE 25 MG PO TABS
25.0000 mg | ORAL_TABLET | Freq: Every day | ORAL | Status: DC
  Administered 2014-04-07 – 2014-04-08 (×2): 25 mg via ORAL
  Filled 2014-04-07 (×2): qty 1

## 2014-04-07 MED ORDER — OXYCODONE HCL 5 MG/5ML PO SOLN: 5.0000 mg | Freq: Once | ORAL | Status: DC | PRN

## 2014-04-07 MED ORDER — PHENYLEPHRINE HCL 10 MG/ML IJ SOLN
INTRAMUSCULAR | Status: DC | PRN
  Administered 2014-04-07: 80 ug via INTRAVENOUS
  Administered 2014-04-07: 40 ug via INTRAVENOUS

## 2014-04-07 MED ORDER — LEVOTHYROXINE SODIUM 137 MCG PO TABS
137.0000 ug | ORAL_TABLET | ORAL | Status: DC
  Administered 2014-04-08: 137 ug via ORAL
  Filled 2014-04-07 (×2): qty 1

## 2014-04-07 SURGICAL SUPPLY — 37 items
APPLIER CLIP 5 13 M/L LIGAMAX5 (MISCELLANEOUS) ×2
APR CLP MED LRG 5 ANG JAW (MISCELLANEOUS) ×1
BAG SPEC RTRVL LRG 6X4 10 (ENDOMECHANICALS) ×1
BLADE SURG ROTATE 9660 (MISCELLANEOUS) IMPLANT
CANISTER SUCTION 2500CC (MISCELLANEOUS) ×2 IMPLANT
CATH REDDICK CHOLANGI 4FR 50CM (CATHETERS) ×2 IMPLANT
CHLORAPREP W/TINT 26ML (MISCELLANEOUS) ×2 IMPLANT
CLIP APPLIE 5 13 M/L LIGAMAX5 (MISCELLANEOUS) ×1 IMPLANT
COVER MAYO STAND STRL (DRAPES) ×2 IMPLANT
COVER SURGICAL LIGHT HANDLE (MISCELLANEOUS) ×2 IMPLANT
DEVICE TROCAR PUNCTURE CLOSURE (ENDOMECHANICALS) ×1 IMPLANT
DRAPE C-ARM 42X72 X-RAY (DRAPES) ×2 IMPLANT
DRAPE LAPAROSCOPIC ABDOMINAL (DRAPES) ×2 IMPLANT
ELECT REM PT RETURN 9FT ADLT (ELECTROSURGICAL) ×2
ELECTRODE REM PT RTRN 9FT ADLT (ELECTROSURGICAL) ×1 IMPLANT
GLOVE BIO SURGEON STRL SZ7.5 (GLOVE) ×2 IMPLANT
GOWN STRL REUS W/ TWL LRG LVL3 (GOWN DISPOSABLE) ×3 IMPLANT
GOWN STRL REUS W/TWL LRG LVL3 (GOWN DISPOSABLE) ×6
IV CATH 14GX2 1/4 (CATHETERS) ×2 IMPLANT
KIT BASIN OR (CUSTOM PROCEDURE TRAY) ×2 IMPLANT
KIT ROOM TURNOVER OR (KITS) ×2 IMPLANT
LIQUID BAND (GAUZE/BANDAGES/DRESSINGS) ×2 IMPLANT
NS IRRIG 1000ML POUR BTL (IV SOLUTION) ×2 IMPLANT
PAD ARMBOARD 7.5X6 YLW CONV (MISCELLANEOUS) ×2 IMPLANT
POUCH SPECIMEN RETRIEVAL 10MM (ENDOMECHANICALS) ×2 IMPLANT
SCISSORS LAP 5X35 DISP (ENDOMECHANICALS) ×2 IMPLANT
SET IRRIG TUBING LAPAROSCOPIC (IRRIGATION / IRRIGATOR) ×2 IMPLANT
SLEEVE ENDOPATH XCEL 5M (ENDOMECHANICALS) ×4 IMPLANT
SPECIMEN JAR SMALL (MISCELLANEOUS) ×2 IMPLANT
SUT MNCRL AB 4-0 PS2 18 (SUTURE) ×2 IMPLANT
TOWEL OR 17X24 6PK STRL BLUE (TOWEL DISPOSABLE) ×2 IMPLANT
TOWEL OR 17X26 10 PK STRL BLUE (TOWEL DISPOSABLE) ×2 IMPLANT
TRAY LAPAROSCOPIC (CUSTOM PROCEDURE TRAY) ×2 IMPLANT
TROCAR BLADELESS 11MM (ENDOMECHANICALS) ×1 IMPLANT
TROCAR XCEL BLUNT TIP 100MML (ENDOMECHANICALS) ×2 IMPLANT
TROCAR XCEL NON-BLD 5MMX100MML (ENDOMECHANICALS) ×2 IMPLANT
TUBING INSUFFLATION (TUBING) ×2 IMPLANT

## 2014-04-07 NOTE — Anesthesia Postprocedure Evaluation (Signed)
Anesthesia Post Note  Patient: Heather [name]  Procedure(s) Performed: Procedure(s) (LRB): LAPAROSCOPIC CHOLECYSTECTOMY WITH INTRAOPERATIVE CHOLANGIOGRAM POSSBILE OPEN (N/A)  Anesthesia type: General  Patient location: PACU  Post pain: Pain level controlled and Adequate analgesia  Post assessment: Post-op Vital signs reviewed, Patient's Cardiovascular Status Stable, Respiratory Function Stable, Patent Airway and Pain level controlled  Last Vitals:  Filed Vitals:   04/07/14 1600  BP:   Pulse: 81  Temp:   Resp: 16    Post vital signs: Reviewed and stable  Level of consciousness: awake, alert  and oriented  Complications: No apparent anesthesia complications

## 2014-04-07 NOTE — Anesthesia Procedure Notes (Signed)
Procedure Name: Intubation Date/Time: 04/07/2014 1:56 PM Performed by: Trixie Deis A Pre-anesthesia Checklist: Patient identified, Timeout performed, Emergency Drugs available, Suction available and Patient being monitored Patient Re-evaluated:Patient Re-evaluated prior to inductionOxygen Delivery Method: Circle system utilized Preoxygenation: Pre-oxygenation with 100% oxygen Intubation Type: IV induction Ventilation: Mask ventilation without difficulty Laryngoscope Size: Mac and 3 Grade View: Grade I Tube type: Oral Tube size: 7.0 mm Number of attempts: 1 Airway Equipment and Method: Stylet Placement Confirmation: ETT inserted through vocal cords under direct vision,  breath sounds checked- equal and bilateral and positive ETCO2 Secured at: 21 cm Tube secured with: Tape Dental Injury: Teeth and Oropharynx as per pre-operative assessment

## 2014-04-07 NOTE — Transfer of Care (Signed)
Immediate Anesthesia Transfer of Care Note  Patient: Heather [name]  Procedure(s) Performed: Procedure(s): LAPAROSCOPIC CHOLECYSTECTOMY WITH INTRAOPERATIVE CHOLANGIOGRAM POSSBILE OPEN (N/A)  Patient Location: PACU  Anesthesia Type:General  Level of Consciousness: awake, alert  and oriented  Airway & Oxygen Therapy: Patient Spontanous Breathing and Patient connected to nasal cannula oxygen  Post-op Assessment: Report given to PACU RN and Post -op Vital signs reviewed and stable  Post vital signs: Reviewed and stable  Complications: No apparent anesthesia complications

## 2014-04-07 NOTE — Interval H&P Note (Signed)
History and Physical Interval Note:  04/07/2014 8:36 AM  Heather [name]  has presented today for surgery, with the diagnosis of Gallstones  The various methods of treatment have been discussed with the patient and family. After consideration of risks, benefits and other options for treatment, the patient has consented to  Procedure(s): LAPAROSCOPIC CHOLECYSTECTOMY WITH INTRAOPERATIVE CHOLANGIOGRAM POSSBILE OPEN (N/A) as a surgical intervention .  The patient's history has been reviewed, patient examined, no change in status, stable for surgery.  I have reviewed the patient's chart and labs.  Questions were answered to the patient's satisfaction.     TOTH III,Damon Hargrove S

## 2014-04-07 NOTE — H&P (Signed)
Heather [name] 03/20/2014 3:56 PM Location: [facility] Patient #: [id number] DOB: 1926-08-18 Married / Language: English / Race: White Female  History of Present Illness Sammuel Hines. Marlou Starks MD; 03/20/2014 4:24 PM) Patient words: eval GB sx.  The patient is a 81 year old female who presents for a follow-up for Abdominal pain. The patient is an 81 year old white female who we have seen in the past with symptomatic gallstones. She does have significant cardiac and pulmonary comorbidities. She has been cleared for surgery by cardiology. She recently had an episode of abdominal pain that took her to the emergency department. It is not clear whether this was a gallbladder attack or whether this was from a fracture of her spine. She continues to have an underlying discomfort in her right upper quadrant with some occasional nausea. She had an ultrasound that showed a 1.4 cm stone in her gallbladder. She also has normal liver functions.   Other Problems Briant Cedar, CMA; 03/20/2014 3:56 PM) Arthritis Atrial Fibrillation Cholelithiasis Chronic Obstructive Lung Disease High blood pressure Thyroid Disease  Past Surgical History Briant Cedar, [place]; 03/20/2014 3:56 PM) Appendectomy Cataract Surgery Bilateral. Colon Removal - Partial Hip Surgery Left. Hysterectomy (not due to cancer) - Partial Spinal Surgery - Lower Back Tonsillectomy  Diagnostic Studies History Briant Cedar, CMA; 03/20/2014 3:56 PM) Colonoscopy 1-5 years ago Mammogram 1-3 years ago Pap Smear 1-5 years ago  Allergies Briant Cedar, [place]; 03/20/2014 3:57 PM) Sulfa Drugs ACE Inhibitors  Medication History Briant Cedar, CMA; 03/20/2014 4:02 PM) Coumadin (2.5MG  Tablet, Oral) Active. Cardizem CD (180MG  Capsule ER 24HR, Oral) Active. Synthroid (137MCG Tablet, Oral) Active. Losartan Potassium (100MG  Tablet, Oral) Active. Hydrochlorothiazide (25MG  Tablet, Oral)  Active. Ventolin HFA (108 (90 Base)MCG/ACT Aerosol Soln, Inhalation) Active.  Social History Briant Cedar, [place]; 03/20/2014 3:56 PM) Alcohol use Occasional alcohol use. Caffeine use Coffee, Tea. No drug use Tobacco use Current every day smoker.  Family History Briant Cedar, [place]; 03/20/2014 3:56 PM) Arthritis Mother. Cerebrovascular Accident Father, Mother. Diabetes Mellitus Brother. Hypertension Father, Mother.  Pregnancy / Birth History Briant Cedar, [place]; 03/20/2014 3:56 PM) Age at menarche 68 years. Age of menopause <47 Contraceptive History Oral contraceptives. Gravida 2 Maternal age 93-25 Para 2 Regular periods  Review of Systems Briant Cedar CMA; 03/20/2014 3:56 PM) General Not Present- Appetite Loss, Chills, Fatigue, Fever, Night Sweats, Weight Gain and Weight Loss. Skin Present- Dryness. Not Present- Change in Wart/Mole, Hives, Jaundice, New Lesions, Non-Healing Wounds, Rash and Ulcer. HEENT Present- Ringing in the Ears and Wears glasses/contact lenses. Not Present- Earache, Hearing Loss, Hoarseness, Nose Bleed, Oral Ulcers, Seasonal Allergies, Sinus Pain, Sore Throat, Visual Disturbances and Yellow Eyes. Respiratory Present- Difficulty Breathing. Not Present- Bloody sputum, Chronic Cough, Snoring and Wheezing. Breast Not Present- Breast Mass, Breast Pain, Nipple Discharge and Skin Changes. Cardiovascular Present- Shortness of Breath and Swelling of Extremities. Not Present- Chest Pain, Difficulty Breathing Lying Down, Leg Cramps, Palpitations and Rapid Heart Rate. Gastrointestinal Present- Bloating, Difficulty Swallowing, Gets full quickly at meals and Nausea. Not Present- Abdominal Pain, Bloody Stool, Change in Bowel Habits, Chronic diarrhea, Constipation, Excessive gas, Hemorrhoids, Indigestion, Rectal Pain and Vomiting. Female Genitourinary Not Present- Frequency, Nocturia, Painful Urination, Pelvic Pain and Urgency. Musculoskeletal Present-  Muscle Weakness. Not Present- Back Pain, Joint Pain, Joint Stiffness, Muscle Pain and Swelling of Extremities. Neurological Present- Headaches, Trouble walking and Weakness. Not Present- Decreased Memory, Fainting, Numbness, Seizures, Tingling and Tremor. Psychiatric Not Present- Anxiety, Bipolar, Change in Sleep Pattern, Depression, Fearful and Frequent crying. Endocrine Not Present-  Cold Intolerance, Excessive Hunger, Hair Changes, Heat Intolerance, Hot flashes and New Diabetes. Hematology Present- Easy Bruising. Not Present- Excessive bleeding, Gland problems, HIV and Persistent Infections.   Vitals Briant Cedar CMA; 03/20/2014 4:05 PM) 03/20/2014 4:02 PM Weight: 114.38 lb Height: 63in Body Surface Area: 1.52 m Body Mass Index: 20.26 kg/m Temp.: 97.85F  Pulse: 80 (Regular)  BP: 100/60 (Sitting, Left Arm, Standard)    Physical Exam Eddie Dibbles S. Marlou Starks MD; 03/20/2014 4:25 PM) General Mental Status-Alert. General Appearance-Consistent with stated age. Hydration-Well hydrated. Voice-Normal.  Head and Neck Head-normocephalic, atraumatic with no lesions or palpable masses. Trachea-midline. Thyroid Gland Characteristics - normal size and consistency.  Eye Eyeball - Bilateral-Extraocular movements intact. Sclera/Conjunctiva - Bilateral-No scleral icterus.  Chest and Lung Exam Chest and lung exam reveals -quiet, even and easy respiratory effort with no use of accessory muscles and on auscultation, normal breath sounds, no adventitious sounds and normal vocal resonance. Inspection Chest Wall - Normal. Back - normal.  Cardiovascular Cardiovascular examination reveals -normal heart sounds, regular rate and rhythm with no murmurs and normal pedal pulses bilaterally.  Abdomen Note: The abdomen is soft with mild discomfort in the right upper quadrant. There is no palpable mass. There is no guarding. She has a well-healed midline abdominal  incision.   Neurologic Neurologic evaluation reveals -alert and oriented x 3 with no impairment of recent or remote memory. Mental Status-Normal.  Musculoskeletal Normal Exam - Left-Upper Extremity Strength Normal and Lower Extremity Strength Normal. Normal Exam - Right-Upper Extremity Strength Normal and Lower Extremity Strength Normal.  Lymphatic Head & Neck  General Head & Neck Lymphatics: Bilateral - Description - Normal. Axillary  General Axillary Region: Bilateral - Description - Normal. Tenderness - Non Tender. Femoral & Inguinal  Generalized Femoral & Inguinal Lymphatics: Bilateral - Description - Normal. Tenderness - Non Tender.    Assessment & Plan Eddie Dibbles S. Marlou Starks MD; 03/20/2014 4:23 PM) GALLSTONES (574.20  K80.20) Impression: The patient appears to have symptomatic gallstones. She also has significant cardiac and pulmonary comorbidities. She has been cleared by cardiology for surgery. I have discussed with her in detail the risks and benefits of the operation to remove the gallbladder as well as some of the technical aspects including the possibility of needing to do this operation in an open fashion and she understands and wishes to proceed. She will need to be off Coumadin for 5 days prior to surgery.     Signed by Luella Cook, MD (03/20/2014 4:25 PM)

## 2014-04-07 NOTE — Op Note (Signed)
04/07/2014  3:20 PM  PATIENT:  Heather [name]  81 y.o. female  PRE-OPERATIVE DIAGNOSIS:  Gallstones  POST-OPERATIVE DIAGNOSIS:  Gallstones  PROCEDURE:  Procedure(s): LAPAROSCOPIC CHOLECYSTECTOMY WITH INTRAOPERATIVE CHOLANGIOGRAM POSSBILE OPEN (N/A)  SURGEON:  Surgeon(s) and Role:    * Jovita Kussmaul, MD - Primary  PHYSICIAN ASSISTANT:   ASSISTANTS: Judyann Munson, RNFA   ANESTHESIA:   general  EBL:  Total I/O In: 1000 [I.V.:1000] Out: -   BLOOD ADMINISTERED:none  DRAINS: none   LOCAL MEDICATIONS USED:  MARCAINE     SPECIMEN:  Source of Specimen:  gallbladder  DISPOSITION OF SPECIMEN:  PATHOLOGY  COUNTS:  YES  TOURNIQUET:  * No tourniquets in log *  DICTATION: .Dragon Dictation  After informed consent was obtained the patient was brought to the operating room and placed in the supine position on the operating room table. After adequate induction of general anesthesia the patient's abdomen was prepped with ChloraPrep, allowed to dry, and draped in usual sterile manner. Because of her previous surgery we elected to access her abdominal cavity through a 5 mm Optiview port placed in the left upper quadrant. This area was infiltrated with quarter percent Marcaine. A small stab incision was made with a 15 blade knife. A 5 mm Optiview port was placed through the layers of the abdominal wall under direct vision bluntly until access was gained to the abdominal cavity. The abdomen was then insufflated with carbon dioxide without difficulty. There was a free area in the right upper quadrant. A site was chosen for placement of another 5 mm port laterally in the right upper quadrant. This area was infiltrated with quarter percent Marcaine. A small stab incision was made with a 15 blade knife. A 5 mm port was placed bluntly through this incision into the abdominal cavity under direct vision. There were some filmy adhesions in the upper abdomen that were taken down sharply with laparoscopic  scissors to give [place] area to work. 2 other 5 mm ports were placed under direct vision in a similar manner across the middle portion of the abdominal cavity. A million-dollar grasper was placed through the lateralmost 5 Molnar port and used to grasp the dome of the gallbladder and elevated anteriorly and superiorly. Another blunt grasper was placed through the other 5 mm port and used to retract on the body and neck of the gallbladder. Some filmy adhesions were taken down sharply with the hook cautery along the body of the gallbladder. The gallbladder neck cystic duct junction was then readily identified and a good window was created. A clip was placed on the gallbladder neck. A small ductotomy was made just below the clip with the laparoscopic scissors. A 14-gauge Angiocath was placed percutaneously through the anterior abdominal wall under direct vision. A Reddick cholangiogram catheter was placed through the Angiocath and flushed. The Reddick catheter was placed in the cystic duct and anchored in place with a clip. A cholangiogram was obtained that showed no filling defects and good emptying into the duodenum with a long length on the cystic duct. The anchoring clip and catheter were then removed from the patient. 3 clips were placed proximally on the cystic duct and the duct was divided between the 2 sets of clips. Posterior to this the cystic artery was identified and dissected circumferentially until a good window was created. 2 clips were placed proximally and one distally on the artery and the artery was divided between the 2 sets of clips. Next a laparoscopic cautery  device was used to separate the gallbladder from the liver bed. Prior to completely detaching the gallbladder from the liver bed the liver bed was inspected and several small bleeding points were coagulated with electrocautery until the area was completely hemostatic. The gallbladder was then detached the rest was a from the liver bed with the  hook cautery. The 5 mm port in the left upper quadrant was exchanged for a 10 mm port. A laparoscopic Bag was inserted through this port and the gallbladder was placed within the bag and the bag was sealed. The bag and gallbladder were removed through this port site without difficulty. The fascial defect was closed with a figure-of-eight 0 Vicryl stitch. The abdomen was then irrigated with copious amounts of saline until the effluent was clear. The liver bed was examined again and found to be hemostatic. The gas was then allowed to escape and the ports were removed without difficulty. The incisions were closed with interrupted 4-0 Monocryl subcuticular stitches. Dermabond dressings were applied. The patient tolerated the procedure well. At the end of the case all needle sponge and instrument counts were correct. The patient was then awakened and taken to recovery in stable condition.  PLAN OF CARE: Admit for overnight observation  PATIENT DISPOSITION:  PACU - hemodynamically stable.   Delay start of Pharmacological VTE agent (>24hrs) due to surgical blood loss or risk of bleeding: not applicable

## 2014-04-08 DIAGNOSIS — K8020 Calculus of gallbladder without cholecystitis without obstruction: Secondary | ICD-10-CM | POA: Diagnosis not present

## 2014-04-08 LAB — PROTIME-INR
INR: 1.13 (ref 0.00–1.49)
Prothrombin Time: 14.6 seconds (ref 11.6–15.2)

## 2014-04-08 MED ORDER — HYDROCODONE-ACETAMINOPHEN 5-325 MG PO TABS: 0.5000 | ORAL_TABLET | Freq: Four times a day (QID) | ORAL | Status: DC | PRN

## 2014-04-08 NOTE — Discharge Instructions (Signed)

## 2014-04-08 NOTE — Progress Notes (Signed)
[facility] Progress Note  1 Day Post-Op  Subjective: Patient feels good, wants to go home.  Not much abdominal pain, no N/V, tolerating regulard diet.  Says when she stood up she lost full control of her bladder.  She hasn't had any urge to urinate yet.  Says she's never had a problem with incontinence in the past.    Objective: Vital signs in last 24 hours: Temp:  [97.2 F (36.2 C)-97.8 F (36.6 C)] 97.8 F (36.6 C) (01/09 0540) Pulse Rate:  [66-84] 74 (01/09 0540) Resp:  [14-19] 16 (01/09 0540) BP: (102-127)/(44-80) 117/76 mmHg (01/09 0540) SpO2:  [94 %-100 %] 95 % (01/09 0540) Weight:  [113 lb 3.2 oz (51.347 kg)] 113 lb 3.2 oz (51.347 kg) (01/08 1715) Last BM Date: 04/03/14  Intake/Output from previous day: 01/08 0701 - 01/09 0700 In: 2486.8 [P.O.:562; I.V.:1924.8] Out: 365 [Urine:350; Blood:15] Intake/Output this shift:    PE: Gen:  Alert, NAD, pleasant Abd: Soft, mild tenderness, ND, +BS, no HSM, incisions C/D/I with dermabond in place   Lab Results:   Recent Labs  04/05/14 1406  WBC 6.7  HGB 14.5  HCT 41.7  PLT 256   BMET  Recent Labs  04/05/14 1406  NA 135  K 3.9  CL 102  CO2 26  GLUCOSE 92  BUN 11  CREATININE 0.84  CALCIUM 9.2   PT/INR  Recent Labs  04/05/14 1406 04/08/14 0346  LABPROT 16.3* 14.6  INR 1.30 1.13   CMP     Component Value Date/Time   NA 135 04/05/2014 1406   K 3.9 04/05/2014 1406   CL 102 04/05/2014 1406   CO2 26 04/05/2014 1406   GLUCOSE 92 04/05/2014 1406   BUN 11 04/05/2014 1406   CREATININE 0.84 04/05/2014 1406   CALCIUM 9.2 04/05/2014 1406   PROT 6.4 02/20/2014 1618   ALBUMIN 3.5 02/20/2014 1618   AST 24 02/20/2014 1618   ALT 17 02/20/2014 1618   ALKPHOS 80 02/20/2014 1618   BILITOT 0.8 02/20/2014 1618   GFRNONAA 62* 04/05/2014 1406   GFRAA 71* 04/05/2014 1406   Lipase     Component Value Date/Time   LIPASE 34 02/20/2014 1618       Studies/Results: Dg Cholangiogram  Operative  04/07/2014   CLINICAL DATA:  Laparoscopic cholecystectomy, choledocholithiasis  EXAM: INTRAOPERATIVE CHOLANGIOGRAM  TECHNIQUE: Cholangiographic images from the C-arm fluoroscopic device were submitted for interpretation post-operatively. Please see the procedural report for the amount of contrast and the fluoroscopy time utilized.  COMPARISON:  Ultrasound 02/20/2014  FINDINGS: No persistent filling defects in the common duct. Intrahepatic ducts are incompletely visualized, appearing decompressed centrally. Contrast passes into the duodenum.  : Negative for retained common duct stone.   Electronically Signed   By: Arne Cleveland M.D.   On: 04/07/2014 15:26    Anti-infectives: Anti-infectives    Start     Dose/Rate Route Frequency Ordered Stop   04/07/14 0600  ceFAZolin (ANCEF) IVPB 2 g/50 mL premix     2 g100 mL/hr over 30 Minutes Intravenous On call to O.R. 04/06/14 1456 04/07/14 1346       Assessment/Plan Gallstones POD #1 s/p lap chole with IOC Urinary incontinence - ?anesthesia drugs  Plan: 1.  Tolerating diet 2.  Having urinary incontinence, if this improved by this afternoon then she can be discharged today 3.  Ambulate and IS 4.  SCD's and coumadin 5.  Hopefully home today     LOS: 1 day    DORT,  Heather [name] 04/08/2014, 8:04 AM Pager: [phone]

## 2014-04-10 ENCOUNTER — Encounter (HOSPITAL_COMMUNITY): Payer: Self-pay | Admitting: General Surgery

## 2014-04-12 ENCOUNTER — Ambulatory Visit (INDEPENDENT_AMBULATORY_CARE_PROVIDER_SITE_OTHER): Payer: 59 | Admitting: Pharmacist Clinician (PhC)/ Clinical Pharmacy Specialist

## 2014-04-12 ENCOUNTER — Ambulatory Visit (INDEPENDENT_AMBULATORY_CARE_PROVIDER_SITE_OTHER): Payer: 59 | Admitting: Internal Medicine

## 2014-04-12 ENCOUNTER — Encounter: Payer: Self-pay | Admitting: Internal Medicine

## 2014-04-12 VITALS — BP 128/78 | HR 78 | Ht 65.0 in | Wt 116.4 lb

## 2014-04-12 DIAGNOSIS — I10 Essential (primary) hypertension: Secondary | ICD-10-CM

## 2014-04-12 DIAGNOSIS — I480 Paroxysmal atrial fibrillation: Secondary | ICD-10-CM

## 2014-04-12 DIAGNOSIS — J438 Other emphysema: Secondary | ICD-10-CM

## 2014-04-12 DIAGNOSIS — Z7901 Long term (current) use of anticoagulants: Secondary | ICD-10-CM

## 2014-04-12 DIAGNOSIS — I4891 Unspecified atrial fibrillation: Secondary | ICD-10-CM

## 2014-04-12 LAB — POCT INR: INR: 1.2

## 2014-04-12 NOTE — Patient Instructions (Signed)
Your physician wants you to follow-up in:  6 months. You will receive a reminder letter in the mail two months in advance. If you don't receive a letter, please call our office to schedule the follow-up appointment.   

## 2014-04-12 NOTE — Progress Notes (Signed)
04/12/2014   Heather [name]   1926/08/29  [id number]  Primary Physicia ARONSON,RICHARD A, MD Primary Cardiologist: Dr Debara Pickett  HPI:  Pleasant 81 y/o followed by Dr Debara Pickett with a history of PAF. She recently had a MET test that indicated Emphysema although she has no history of smoking. She is on chronic Coumadin. Her B/P has been controlled by her readings at home and our readings here by our pharmacist. Echo in Feb 2013 showed an EF of >55%, NL LA size, and NL RVF. She had an episode of weakness and palpitations 11/13/12 pm. EMS was contacted and she was taken to [facility]. She received Diltiazem en route and was in NSR when she arrived at [facility]. They suggested she increase her Diltiazem to 240 mg daily but she was hesitant to do this.  Heather [name] returns today for followup. She is extubated doing remarkably well. Her shortness of breath has stabilized and she has not been admitted recently for worsening COPD or pulmonary problems. She is maintaining a sinus rhythm in the 80s without any evidence of recurrent A. fib. She is on warfarin with therapeutic INRs.  She does have some dependent rubor in both of her legs. I was able to palpate a decent dorsalis pedis pulse on the right foot but not on the left. She may have small vessel disease of the toes. It would be reasonable to consider arterial Dopplers.  [name]. She recently has been having problems with gallstones and underwent a cholecystectomy. Postoperatively she developed pain and has been on narcotics. She has not had a bowel movement in about 1-1/2 weeks. She reports significant pain and distention of the abdomen which is firm. She did hold her warfarin for the procedure and has been restarted. Her INR is subtherapeutic today and will require further adjustment.  Current Outpatient Prescriptions  Medication Sig Dispense Refill  . albuterol (PROVENTIL HFA;VENTOLIN HFA) 108 (90 BASE) MCG/ACT inhaler Inhale 2 puffs into the  lungs every 6 (six) hours as needed for wheezing or shortness of breath. 1 Inhaler 2  . calcium citrate-vitamin D (CITRACAL+D) 315-200 MG-UNIT per tablet Take 1 tablet by mouth daily.    Marland Kitchen diltiazem (CARDIZEM CD) 180 MG 24 hr capsule Take 180 mg by mouth daily.    . hydrochlorothiazide (HYDRODIURIL) 25 MG tablet TAKE ONE TABLET BY MOUTH ONCE DAILY 90 tablet 2  . HYDROcodone-acetaminophen (NORCO/VICODIN) 5-325 MG per tablet Take 0.5-1 tablets by mouth every 6 (six) hours as needed for moderate pain or severe pain. 30 tablet 0  . levothyroxine (SYNTHROID, LEVOTHROID) 137 MCG tablet Take 68.5-137 mcg by mouth daily. Take half tablet on Monday, Wednesday, and Friday. Take 1 tablet all other days    . losartan (COZAAR) 100 MG tablet TAKE 1 TABLET BY MOUTH DAILY 30 tablet 11  . Multiple Vitamins-Minerals (MULTIVITAMIN WITH MINERALS) tablet Take 1 tablet by mouth daily.    . naphazoline-glycerin (CLEAR EYES) 0.012-0.2 % SOLN Place 1-2 drops into both eyes 2 (two) times daily.    . polyethylene glycol (MIRALAX / GLYCOLAX) packet Take 17 g by mouth daily.    Marland Kitchen warfarin (COUMADIN) 2.5 MG tablet Take 1 to 1 & 1/2 tablets by mouth daily as directed (Patient taking differently: Take 2.5 mg by mouth daily. ) 40 tablet 5   No current facility-administered medications for this visit.    Allergies  Allergen Reactions  . Ace Inhibitors Cough  . Sulfur Nausea And Vomiting  History   Social History  . Marital Status: Married    Spouse Name: N/A    Number of Children: 2  . Years of Education: N/A   Occupational History  . Retired Astronomer    Social History Main Topics  . Smoking status: Former Smoker -- 1.00 packs/day for 20 years    Types: Cigarettes    Quit date: 09/11/1988  . Smokeless tobacco: Never Used  . Alcohol Use: Yes     Comment: occ  . Drug Use: No  . Sexual Activity: Yes   Other Topics Concern  . Not on file   Social History Narrative     Review of Systems: General:  negative for chills, fever, night sweats or weight changes.  Cardiovascular: negative for chest pain, dyspnea on exertion, edema, orthopnea, palpitations, paroxysmal nocturnal dyspnea or shortness of breath Dermatological: negative for rash Respiratory: negative for cough or wheezing Urologic: negative for hematuria Abdominal: Abnormal pain, distention and ecchymosis with postoperative surgical changes Neurologic: negative for visual changes, syncope, or dizziness All other systems reviewed and are otherwise negative except as noted above.    Blood pressure 128/78, pulse 78, height '5\' 5"'  (1.651 m), weight 116 lb 6.4 oz (52.799 kg).  General: Awake, oriented, in no distress HEENT: PERRLA, EOMI Neck: No JVP, no bruits Heart: Regular rate rhythm, occasional ectopy Lungs: Decreased breath sounds bilaterally, no rales or rhonchi or wheezes Abdomen: Firm, doughy, some redness around the surgical sites with tenderness to palpation, no rebound or guarding, distended Extremities: No edema Neurologic: Grossly intact Psych: Normal mood and affect  EKG  Sinus rhythm at 78  ASSESSMENT AND PLAN:   Patient Active Problem List   Diagnosis Date Noted  . Gallstones 09/20/2013  . Community acquired pneumonia 05/17/2013  . COPD (chronic obstructive pulmonary disease) 05/17/2013  . Dyspnea on exertion 04/25/2013  . Pulmonary emphysema 11/15/2012  . HTN (hypertension) 11/15/2012  . PAF (paroxysmal atrial fibrillation) 06/14/2012  . Long term current use of anticoagulant therapy 06/14/2012  . Ventral hernia 07/31/2011  . Abdominal wall mass 07/01/2011   PLAN: 1.  Heather [name] has had no further palpitations. She recently underwent cholecystectomy and is having problems with constipation and abdominal discomfort. Her abdomen on exam today is firm and appears somewhat doughy. She has pain in the left lower quadrant. There is no evidence for cellulitis or wound infection. She has been taking MiraLAX  without any benefit. She may need stimulant laxatives. I asked her to follow-up with her surgeon regarding management of this. She may need to be seen earlier than February given the fact that she is in significant pain.  Pixie Casino, MD, [facility] Attending Cardiologist [facility] 04/12/2014 2:30 PM

## 2014-04-17 ENCOUNTER — Other Ambulatory Visit: Payer: Self-pay | Admitting: Pharmacist Clinician (PhC)/ Clinical Pharmacy Specialist

## 2014-04-19 ENCOUNTER — Ambulatory Visit (INDEPENDENT_AMBULATORY_CARE_PROVIDER_SITE_OTHER): Payer: Medicare HMO | Admitting: Infectious Diseases

## 2014-04-19 ENCOUNTER — Encounter: Payer: Self-pay | Admitting: Infectious Diseases

## 2014-04-19 VITALS — BP 128/76 | HR 80 | Temp 97.7°F | Ht 65.0 in | Wt 114.0 lb

## 2014-04-19 DIAGNOSIS — B029 Zoster without complications: Secondary | ICD-10-CM | POA: Insufficient documentation

## 2014-04-19 MED ORDER — VALACYCLOVIR HCL 1 G PO TABS: 1000.0000 mg | ORAL_TABLET | Freq: Two times a day (BID) | ORAL | Status: DC

## 2014-04-19 MED ORDER — GABAPENTIN 300 MG PO CAPS: 300.0000 mg | ORAL_CAPSULE | Freq: Three times a day (TID) | ORAL | Status: DC

## 2014-04-19 NOTE — Assessment & Plan Note (Signed)
Will give her a course of valtrex. I explained to her and her husband that she is somewhat late in the course of her rash for this to be optimal. Hopefully, it will speed healing.  I will also give her a course of neurontin in hopes that this will decrease her pain and decrease her risk of post-herpetic neuralgia.  Will see her back prn. Will f/u with her PCP as far as how long to continue the neurontin and the tapering of this.

## 2014-04-19 NOTE — Progress Notes (Signed)
   Subjective:    Patient ID: Heather [name], female    DOB: 08-26-1926, 81 y.o.   MRN: [id number]  HPI 81 yo F with hx of HTN and afib, s/p lap cholecystectomy 04-07-14. Roughly 2 days after surgery, she developed rash on her L buttock that was very painful. It has extended and involved her vagina as well. Is on one side of her body alone.  No fever (highest 99.9). No chills or sweats.  No recollection of having chicken pox as a child.   Review of Systems  Constitutional: Negative for appetite change and unexpected weight change.  HENT: Negative for mouth sores.   Eyes: Negative for visual disturbance.  Gastrointestinal: Positive for constipation. Negative for diarrhea.  Genitourinary: Negative for difficulty urinating.  Skin: Positive for rash.  urinary hesitancy/retention.       Objective:   Physical Exam  Constitutional: She appears well-developed and well-nourished.  HENT:  Mouth/Throat: No oropharyngeal exudate.  Eyes: EOM are normal. Pupils are equal, round, and reactive to light.  Neck: Neck supple.  Cardiovascular: Normal rate, regular rhythm and normal heart sounds.   Pulmonary/Chest: Effort normal and breath sounds normal.  Abdominal: Soft. Bowel sounds are normal. She exhibits no distension. There is no tenderness.  Lymphadenopathy:    She has no cervical adenopathy.  Skin:             Assessment & Plan:

## 2014-04-26 ENCOUNTER — Ambulatory Visit (INDEPENDENT_AMBULATORY_CARE_PROVIDER_SITE_OTHER): Payer: 59 | Admitting: Pharmacist Clinician (PhC)/ Clinical Pharmacy Specialist

## 2014-04-26 DIAGNOSIS — I4891 Unspecified atrial fibrillation: Secondary | ICD-10-CM

## 2014-04-26 DIAGNOSIS — I480 Paroxysmal atrial fibrillation: Secondary | ICD-10-CM

## 2014-04-26 DIAGNOSIS — Z7901 Long term (current) use of anticoagulants: Secondary | ICD-10-CM

## 2014-04-26 LAB — POCT INR: INR: 2.3

## 2014-05-10 ENCOUNTER — Ambulatory Visit (INDEPENDENT_AMBULATORY_CARE_PROVIDER_SITE_OTHER): Payer: Medicare HMO | Admitting: Pharmacist Clinician (PhC)/ Clinical Pharmacy Specialist

## 2014-05-10 DIAGNOSIS — Z7901 Long term (current) use of anticoagulants: Secondary | ICD-10-CM

## 2014-05-10 DIAGNOSIS — I480 Paroxysmal atrial fibrillation: Secondary | ICD-10-CM

## 2014-05-10 DIAGNOSIS — I4891 Unspecified atrial fibrillation: Secondary | ICD-10-CM

## 2014-05-10 LAB — POCT INR: INR: 2.1

## 2014-05-23 ENCOUNTER — Telehealth: Payer: Self-pay | Admitting: Licensed Clinical Social Worker

## 2014-05-23 ENCOUNTER — Other Ambulatory Visit: Payer: Self-pay | Admitting: Licensed Clinical Social Worker

## 2014-05-23 DIAGNOSIS — B029 Zoster without complications: Secondary | ICD-10-CM

## 2014-05-23 MED ORDER — VALACYCLOVIR HCL 1 G PO TABS: 1000.0000 mg | ORAL_TABLET | Freq: Two times a day (BID) | ORAL | Status: DC

## 2014-05-23 NOTE — Telephone Encounter (Signed)
Patient agreed. Called Valtrex in

## 2014-05-23 NOTE — Telephone Encounter (Signed)
Please refill valtrex.  If not resolved after 1 week, have her call us for appt

## 2014-05-23 NOTE — Telephone Encounter (Signed)
Patient called stating that the shingles lesions has spread to her left labia and rectum, she is not in a lot of pain or discomfort but she would like to know what to do about these new lesions. Her valtrex does not have refills, is there any other recommendations?

## 2014-06-05 NOTE — Telephone Encounter (Signed)
Patient called and advised she has finished the second round of Valtrex for shingles and is still having problems. She advises she has open blisters on her vagina and thigh. The outside ones have healed but the ones inside and near her vagina are still open and oozing. She advised she has trouble sitting as well due to the pain. She wants to see the doctor asap. Per Dr Johnnye Sima last note gave her first available appt with Dr Linus Salmons 06/06/14.

## 2014-06-06 ENCOUNTER — Encounter: Payer: Self-pay | Admitting: Internal Medicine

## 2014-06-06 ENCOUNTER — Ambulatory Visit (INDEPENDENT_AMBULATORY_CARE_PROVIDER_SITE_OTHER): Payer: Medicare PPO | Admitting: Internal Medicine

## 2014-06-06 VITALS — BP 155/74 | HR 98 | Temp 98.1°F | Ht 65.0 in | Wt 114.0 lb

## 2014-06-06 DIAGNOSIS — B0229 Other postherpetic nervous system involvement: Secondary | ICD-10-CM

## 2014-06-06 NOTE — Assessment & Plan Note (Signed)
Symptoms mainly are pain which is expected in post zoster. She declined to take Neurontin since it is not that bothersome. No indication for further Valtrex.

## 2014-06-06 NOTE — Progress Notes (Signed)
   Subjective:    Patient ID: Heather [name], female    DOB: 12-13-1926, 81 y.o.   MRN: [id number]  HPI She comes in for a work in visit for shingles.  She was seen January 20 with rash from her left buttock and was painful extended to her vagina and diagnosed with zoster.  It had been going on for several days and she took Valtrex.  She has noted a bump on her vagina in what she feels like is a fleshy thing on her rectum that is nonpainful. This continues to be on the left side and has not crossed the midline. Ulcers and crusting have essentially resolved.   Review of Systems  Constitutional: Negative for fever.  Skin: Negative for rash.       Objective:   Physical Exam  Genitourinary:  She deferred exam          Assessment & Plan:

## 2014-06-07 ENCOUNTER — Ambulatory Visit (INDEPENDENT_AMBULATORY_CARE_PROVIDER_SITE_OTHER): Payer: Medicare PPO | Admitting: Pharmacist Clinician (PhC)/ Clinical Pharmacy Specialist

## 2014-06-07 DIAGNOSIS — Z7901 Long term (current) use of anticoagulants: Secondary | ICD-10-CM

## 2014-06-07 DIAGNOSIS — I480 Paroxysmal atrial fibrillation: Secondary | ICD-10-CM

## 2014-06-07 DIAGNOSIS — I4891 Unspecified atrial fibrillation: Secondary | ICD-10-CM

## 2014-06-07 LAB — POCT INR: INR: 2.1

## 2014-06-14 ENCOUNTER — Telehealth: Payer: Self-pay | Admitting: *Deleted

## 2014-06-14 NOTE — Telephone Encounter (Signed)
Faxed clearance for right middle finger extensor stabilization with tendon transfer and possible joint repair replacement to Dr. Amedeo Plenty Can hold warfarin for 5 days prior to procedure

## 2014-06-26 ENCOUNTER — Encounter (HOSPITAL_COMMUNITY): Payer: Self-pay | Admitting: *Deleted

## 2014-06-26 ENCOUNTER — Emergency Department (HOSPITAL_COMMUNITY): Payer: Medicare PPO

## 2014-06-26 ENCOUNTER — Emergency Department (HOSPITAL_COMMUNITY)
Admission: EM | Admit: 2014-06-26 | Discharge: 2014-06-26 | Disposition: A | Discharge status: Home / Self Care | Payer: Medicare PPO | Attending: Emergency Medicine | Admitting: Emergency Medicine

## 2014-06-26 DIAGNOSIS — Z79899 Other long term (current) drug therapy: Secondary | ICD-10-CM | POA: Insufficient documentation

## 2014-06-26 DIAGNOSIS — Z8719 Personal history of other diseases of the digestive system: Secondary | ICD-10-CM | POA: Diagnosis not present

## 2014-06-26 DIAGNOSIS — M810 Age-related osteoporosis without current pathological fracture: Secondary | ICD-10-CM | POA: Diagnosis not present

## 2014-06-26 DIAGNOSIS — S01112A Laceration without foreign body of left eyelid and periocular area, initial encounter: Secondary | ICD-10-CM | POA: Insufficient documentation

## 2014-06-26 DIAGNOSIS — Y92009 Unspecified place in unspecified non-institutional (private) residence as the place of occurrence of the external cause: Secondary | ICD-10-CM | POA: Diagnosis not present

## 2014-06-26 DIAGNOSIS — Z7901 Long term (current) use of anticoagulants: Secondary | ICD-10-CM | POA: Diagnosis not present

## 2014-06-26 DIAGNOSIS — J449 Chronic obstructive pulmonary disease, unspecified: Secondary | ICD-10-CM | POA: Diagnosis not present

## 2014-06-26 DIAGNOSIS — Y998 Other external cause status: Secondary | ICD-10-CM | POA: Insufficient documentation

## 2014-06-26 DIAGNOSIS — I4891 Unspecified atrial fibrillation: Secondary | ICD-10-CM | POA: Diagnosis not present

## 2014-06-26 DIAGNOSIS — Z8701 Personal history of pneumonia (recurrent): Secondary | ICD-10-CM | POA: Diagnosis not present

## 2014-06-26 DIAGNOSIS — Z87891 Personal history of nicotine dependence: Secondary | ICD-10-CM | POA: Insufficient documentation

## 2014-06-26 DIAGNOSIS — F419 Anxiety disorder, unspecified: Secondary | ICD-10-CM | POA: Diagnosis not present

## 2014-06-26 DIAGNOSIS — Z23 Encounter for immunization: Secondary | ICD-10-CM | POA: Insufficient documentation

## 2014-06-26 DIAGNOSIS — Y9301 Activity, walking, marching and hiking: Secondary | ICD-10-CM | POA: Diagnosis not present

## 2014-06-26 DIAGNOSIS — E039 Hypothyroidism, unspecified: Secondary | ICD-10-CM | POA: Insufficient documentation

## 2014-06-26 DIAGNOSIS — E871 Hypo-osmolality and hyponatremia: Secondary | ICD-10-CM

## 2014-06-26 DIAGNOSIS — G629 Polyneuropathy, unspecified: Secondary | ICD-10-CM | POA: Diagnosis not present

## 2014-06-26 DIAGNOSIS — Y9389 Activity, other specified: Secondary | ICD-10-CM | POA: Insufficient documentation

## 2014-06-26 DIAGNOSIS — W01198A Fall on same level from slipping, tripping and stumbling with subsequent striking against other object, initial encounter: Secondary | ICD-10-CM | POA: Insufficient documentation

## 2014-06-26 DIAGNOSIS — W19XXXA Unspecified fall, initial encounter: Secondary | ICD-10-CM

## 2014-06-26 DIAGNOSIS — I10 Essential (primary) hypertension: Secondary | ICD-10-CM | POA: Diagnosis not present

## 2014-06-26 DIAGNOSIS — M1990 Unspecified osteoarthritis, unspecified site: Secondary | ICD-10-CM | POA: Diagnosis not present

## 2014-06-26 DIAGNOSIS — IMO0002 Reserved for concepts with insufficient information to code with codable children: Secondary | ICD-10-CM

## 2014-06-26 LAB — CBC WITH DIFFERENTIAL/PLATELET
BASOS ABS: 0 10*3/uL (ref 0.0–0.1)
BASOS PCT: 0 % (ref 0–1)
EOS ABS: 0.4 10*3/uL (ref 0.0–0.7)
EOS PCT: 5 % (ref 0–5)
HEMATOCRIT: 39.8 % (ref 36.0–46.0)
HEMOGLOBIN: 14.2 g/dL (ref 12.0–15.0)
Lymphocytes Relative: 23 % (ref 12–46)
Lymphs Abs: 2.1 10*3/uL (ref 0.7–4.0)
MCH: 34 pg (ref 26.0–34.0)
MCHC: 35.7 g/dL (ref 30.0–36.0)
MCV: 95.2 fL (ref 78.0–100.0)
Monocytes Absolute: 0.6 10*3/uL (ref 0.1–1.0)
Monocytes Relative: 7 % (ref 3–12)
Neutro Abs: 5.9 10*3/uL (ref 1.7–7.7)
Neutrophils Relative %: 65 % (ref 43–77)
Platelets: 318 10*3/uL (ref 150–400)
RBC: 4.18 MIL/uL (ref 3.87–5.11)
RDW: 14.2 % (ref 11.5–15.5)
WBC: 9.1 10*3/uL (ref 4.0–10.5)

## 2014-06-26 LAB — BASIC METABOLIC PANEL
Anion gap: 10 (ref 5–15)
BUN: 12 mg/dL (ref 6–23)
CHLORIDE: 90 mmol/L — AB (ref 96–112)
CO2: 26 mmol/L (ref 19–32)
Calcium: 8.7 mg/dL (ref 8.4–10.5)
Creatinine, Ser: 0.5 mg/dL (ref 0.50–1.10)
GFR calc non Af Amer: 86 mL/min — ABNORMAL LOW (ref >90)
Glucose, Bld: 93 mg/dL (ref 70–99)
POTASSIUM: 3.4 mmol/L — AB (ref 3.5–5.1)
SODIUM: 126 mmol/L — AB (ref 135–145)

## 2014-06-26 LAB — PROTIME-INR
INR: 1.83 — ABNORMAL HIGH (ref 0.00–1.49)
Prothrombin Time: 21.4 seconds — ABNORMAL HIGH (ref 11.6–15.2)

## 2014-06-26 MED ORDER — HYDROCODONE-ACETAMINOPHEN 5-325 MG PO TABS
2.0000 | ORAL_TABLET | Freq: Once | ORAL | Status: AC
  Administered 2014-06-26: 2 via ORAL
  Filled 2014-06-26: qty 2

## 2014-06-26 MED ORDER — TETANUS-DIPHTH-ACELL PERTUSSIS 5-2.5-18.5 LF-MCG/0.5 IM SUSP
0.5000 mL | Freq: Once | INTRAMUSCULAR | Status: AC
  Administered 2014-06-26: 0.5 mL via INTRAMUSCULAR
  Filled 2014-06-26: qty 0.5

## 2014-06-26 MED ORDER — LIDOCAINE-EPINEPHRINE (PF) 2 %-1:200000 IJ SOLN
10.0000 mL | Freq: Once | INTRAMUSCULAR | Status: AC
  Administered 2014-06-26: 10 mL
  Filled 2014-06-26: qty 20

## 2014-06-26 MED ORDER — SODIUM CHLORIDE 0.9 % IV BOLUS (SEPSIS)
1000.0000 mL | Freq: Once | INTRAVENOUS | Status: AC
  Administered 2014-06-26: 1000 mL via INTRAVENOUS

## 2014-06-26 NOTE — ED Notes (Signed)
Patient transported to CT 

## 2014-06-26 NOTE — ED Notes (Signed)
Pt walks with walker and lost balance; fell hitting head on floor; presents with laceration left eyebrow; bleeding controlled; neg loc; denies neck pain; c/o nausea--states that is her normal nausea; denies any other c/o injury; alert and oriented x 3; pupils equal and reactive at 66mm; c/o headache rating 10/10

## 2014-06-26 NOTE — Progress Notes (Signed)
CSW met with pt at bedside. There was no family present. Patient confirms that she presents to [facility] today due to falling. Patient stated " I just lost my balance.My legs are real weak." Patient states that she usually uses a walker when ambulating but admits that she was not using a walker during this incident.  Patient informed CSW that she lives at home with her husband in [place]. She stated " My husband helps me a lot around the house." Patient informed CSW that her primary support system is her husband, daughter and son. Patient states that both of her children live in [place].  Patient states that she is able to complete her ADL's independently. She informed CSW that she is not interested in going to a facility. Once asked about a facility the patient responded " Oh no, I dont need to do that. I can function pretty good." Patient appears to be determined that she will be able to maintain herself and the proper care at home. Patient made it clear that she is not interested in a facility at this time.  Heather [name] [phone] ED CSW 06/26/2014 11:08 PM

## 2014-06-26 NOTE — ED Notes (Signed)
Pt was able to ambulate with walker for about 20 feet.

## 2014-06-26 NOTE — ED Provider Notes (Signed)
CSN: [id number]     Arrival date & time 06/26/14  1357 History   First MD Initiated Contact with Patient 06/26/14 1507     Chief Complaint  Patient presents with  . Fall  . Head Injury     (Consider location/radiation/quality/duration/timing/severity/associated sxs/prior Treatment) HPI Heather [name] is an 81 year old female past medical history of A. fib, on Coumadin, COPD, peripheral neuropathy, osteoporosis who presents the ER after a fall. Patient states she was walking with her walker at home, lost her balance and fell, hitting her head on the carpeted floor. Patient reports lacerating her eyebrow on the floor. She reports bleeding immediately after, and persistent headache. Patient denies loss of consciousness, dizziness, weakness, blurred vision, loss of vision, neck pain, chest pain, shortness of breath, nausea, vomiting, abdominal pain. Patient denies having any other injuries after fall.  Past Medical History  Diagnosis Date  . Atrial fibrillation   . 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 COPD (chronic obstructive pulmonary disease)   . Essential hypertension, benign   . Unspecified hypothyroidism   . Gallstones   . Peripheral neuropathy   . Venous insufficiency   . Osteoporosis   . Pneumonia     states she had it twice, last time a couple of months ago  . Shortness of breath     with exertion  . GERD (gastroesophageal reflux disease)   . H/O hiatal hernia   . Arthritis   . Anxiety   . Mild aortic insufficiency   . History of nuclear stress test 10/2010    dipyridamole; normal pattern of perfusion; low risk, normal study  . PONV (postoperative nausea and vomiting)   . Dysrhythmia     AF   Past Surgical History  Procedure Laterality Date  . Total abdominal hysterectomy  1970  . Tonsillectomy  1935  . Left hip relacement  2000  . Back surgery      x2  . Colon surgery  2008    partial  . Appendectomy  1935  . Cataract  extraction w/ intraocular lens  implant, bilateral    . Ventral hernia repair  09/19/2011    Procedure: LAPAROSCOPIC VENTRAL HERNIA;  Surgeon: Merrie Roof, MD;  Location: [facility];  Service: General;  Laterality: N/A;  laparoscopic ventral hernia repair with mesh  . Cardiometablic testing  09/24/348    submaximal effort with peak RER of 0.5, peak VO2 79% predicted; HR peak up to 78%; PVC was 55% predicted, PEV1 39% predicted; PEV1/VC ratio was reduced; normal vital capacity; DLCO was reduced to 63%  . Transthoracic echocardiogram  05/2011    EF=>55%; mild conc LVH; mild mitral annular calcif; mild TR; AV mildly sclerotic & mild AR  . Hernia repair    . Carpal tunnel release    . Joint replacement    . Cholecystectomy  04/07/2014    dr Marlou Starks  . Cholecystectomy N/A 04/07/2014    Procedure: LAPAROSCOPIC CHOLECYSTECTOMY WITH INTRAOPERATIVE CHOLANGIOGRAM POSSBILE OPEN;  Surgeon: Autumn Messing III, MD;  Location: [facility];  Service: General;  Laterality: N/A;   Family History  Problem Relation Age of Onset  . Stroke Mother   . Stroke Father   . Heart block Brother   . Bladder Cancer Brother   . Diabetes Brother   . Stroke Brother    History  Substance Use Topics  . Smoking status: Former Smoker -- 1.00 packs/day for 20 years    Types: Cigarettes    Quit date:  09/11/1988  . Smokeless tobacco: Never Used  . Alcohol Use: 0.0 oz/week    0 Standard drinks or equivalent per week     Comment: occ   OB History    No data available     Review of Systems  Constitutional: Negative for fever.  HENT: Negative for trouble swallowing.   Eyes: Negative for visual disturbance.  Respiratory: Negative for shortness of breath.   Cardiovascular: Negative for chest pain.  Gastrointestinal: Negative for nausea, vomiting and abdominal pain.  Genitourinary: Negative for dysuria.  Musculoskeletal: Negative for neck pain.  Skin: Positive for wound. Negative for rash.  Neurological: Positive for headaches.  Negative for dizziness, weakness and numbness.  Psychiatric/Behavioral: Negative.       Allergies  Ace inhibitors and Sulfur  Home Medications   Prior to Admission medications   Medication Sig Start Date End Date Taking? Authorizing Provider  albuterol (PROVENTIL HFA;VENTOLIN HFA) 108 (90 BASE) MCG/ACT inhaler Inhale 2 puffs into the lungs every 6 (six) hours as needed for wheezing or shortness of breath. 05/23/13  Yes Burnard Bunting, MD  calcium citrate-vitamin D (CITRACAL+D) 315-200 MG-UNIT per tablet Take 1 tablet by mouth daily.   Yes Historical Provider, MD  diltiazem (CARDIZEM CD) 180 MG 24 hr capsule Take 180 mg by mouth daily.   Yes Historical Provider, MD  hydrochlorothiazide (HYDRODIURIL) 25 MG tablet Take 25 mg by mouth daily.   Yes Historical Provider, MD  levothyroxine (SYNTHROID, LEVOTHROID) 137 MCG tablet Take 137 mcg by mouth daily before breakfast.    Yes Historical Provider, MD  losartan (COZAAR) 100 MG tablet Take 100 mg by mouth daily.   Yes Historical Provider, MD  Multiple Vitamins-Minerals (MULTIVITAMIN WITH MINERALS) tablet Take 1 tablet by mouth daily.   Yes Historical Provider, MD  naphazoline-glycerin (CLEAR EYES) 0.012-0.2 % SOLN Place 1-2 drops into both eyes 2 (two) times daily.   Yes Historical Provider, MD  polyethylene glycol (MIRALAX / GLYCOLAX) packet Take 17 g by mouth daily.   Yes Historical Provider, MD  warfarin (COUMADIN) 2.5 MG tablet Take 2.5 mg by mouth daily.   Yes Historical Provider, MD  gabapentin (NEURONTIN) 300 MG capsule Take 1 capsule (300 mg total) by mouth 3 (three) times daily. Patient not taking: Reported on 06/26/2014 04/19/14   Campbell Riches, MD  hydrochlorothiazide (HYDRODIURIL) 25 MG tablet TAKE ONE TABLET BY MOUTH ONCE DAILY 04/03/14   Pixie Casino, MD  HYDROcodone-acetaminophen (NORCO/VICODIN) 5-325 MG per tablet Take 0.5-1 tablets by mouth every 6 (six) hours as needed for moderate pain or severe pain. Patient not taking:  Reported on 06/26/2014 04/08/14   Nehemiah Massed Dort, PA-C  losartan (COZAAR) 100 MG tablet TAKE 1 TABLET BY MOUTH DAILY 06/18/13   Pixie Casino, MD  valACYclovir (VALTREX) 1000 MG tablet Take 1 tablet (1,000 mg total) by mouth 2 (two) times daily. Patient not taking: Reported on 06/26/2014 05/23/14   Campbell Riches, MD  warfarin (COUMADIN) 2.5 MG tablet TAKE ONE TO ONE AND ONE-HALF TABLETS BY MOUTH DAILY AS DIRECTED 04/17/14   Pixie Casino, MD   BP 113/50 mmHg  Pulse 70  Temp(Src) 97.5 F (36.4 C)  Resp 16  Wt 112 lb (50.803 kg)  SpO2 93% Physical Exam  Constitutional: She is oriented to person, place, and time. She appears well-developed and well-nourished. No distress.  HENT:  Head: Normocephalic and atraumatic.    Mouth/Throat: Oropharynx is clear and moist. No oropharyngeal exudate.  2-3 centimeter laceration noted to  the lateral aspect of left eyebrow.  Eyes: Conjunctivae and EOM are normal. Pupils are equal, round, and reactive to light. Right eye exhibits no discharge. Left eye exhibits no discharge. No scleral icterus.  Neck: Normal range of motion and full passive range of motion without pain. Neck supple. No spinous process tenderness and no muscular tenderness present. No rigidity. No edema, no erythema and normal range of motion present. No Brudzinski's sign and no Kernig's sign noted.  Cardiovascular: Normal rate, regular rhythm and normal heart sounds.   No murmur heard. Pulmonary/Chest: Effort normal and breath sounds normal. No respiratory distress.  Abdominal: Soft. There is no tenderness.  Musculoskeletal: Normal range of motion. She exhibits no edema or tenderness.  Neurological: She is alert and oriented to person, place, and time. She has normal strength. No cranial nerve deficit or sensory deficit. Coordination and gait normal. GCS eye subscore is 4. GCS verbal subscore is 5. GCS motor subscore is 6.  Patient fully alert, answering questions appropriately in full,  clear sentences. Cranial nerves II through XII grossly intact. Motor strength 5 out of 5 in all major muscle groups of upper and lower extremities. Distal sensation intact.  Skin: Skin is warm and dry. No rash noted. She is not diaphoretic.  Psychiatric: She has a normal mood and affect.  Nursing note and vitals reviewed.   ED Course  LACERATION REPAIR Date/Time: 06/26/2014 5:53 PM Performed by: Dahlia Bailiff Authorized by: Dahlia Bailiff Consent: Verbal consent obtained. Risks and benefits: risks, benefits and alternatives were discussed Consent given by: patient Patient identity confirmed: verbally with patient Time out: Immediately prior to procedure a "time out" was called to verify the correct patient, procedure, equipment, support staff and site/side marked as required. Body area: head/neck Location details: left eyebrow Laceration length: 3 cm Foreign bodies: no foreign bodies Tendon involvement: none Nerve involvement: none Vascular damage: no Anesthesia: local infiltration Local anesthetic: lidocaine 2% with epinephrine Anesthetic total: 3 ml Patient sedated: no Skin closure: 5-0 Prolene Subcutaneous closure: 4-0 Chromic gut Number of sutures: 9 Technique: simple Approximation: close Approximation difficulty: simple Dressing: 4x4 sterile gauze Patient tolerance: Patient tolerated the procedure well with no immediate complications Comments: 1 SQ, 8 cutaneous    (including critical care time) Labs Review Labs Reviewed  BASIC METABOLIC PANEL - Abnormal; Notable for the following:    Sodium 126 (*)    Potassium 3.4 (*)    Chloride 90 (*)    GFR calc non Af Amer 86 (*)    All other components within normal limits  PROTIME-INR - Abnormal; Notable for the following:    Prothrombin Time 21.4 (*)    INR 1.83 (*)    All other components within normal limits  CBC WITH DIFFERENTIAL/PLATELET    Imaging Review Ct Head Wo Contrast  06/26/2014   CLINICAL DATA:  Patient fell  hitting head.  Headache since fall  EXAM: CT HEAD WITHOUT CONTRAST  TECHNIQUE: Contiguous axial images were obtained from the base of the skull through the vertex without intravenous contrast.  COMPARISON:  January 16, 2013  FINDINGS: Mild diffuse atrophy is stable. There is no intracranial mass, hemorrhage, extra-axial fluid collection, or midline shift. There is patchy small vessel disease throughout the centra semiovale bilaterally, stable. There is no new gray-white compartment lesion. No acute infarct is apparent. Bony calvarium appears intact. The mastoid air cells are clear.  IMPRESSION: Stable atrophy with periventricular small vessel disease. No intracranial mass, hemorrhage, or extra-axial fluid collection. No evidence  of acute infarct.   Electronically Signed   By: Lowella Grip III M.D.   On: 06/26/2014 15:22     EKG Interpretation None      MDM   Final diagnoses:  Fall, initial encounter  Laceration  Hyponatremia    Patient here status post fall. Imaging on patient's head due to Coumadin use. Labs and INR drawn. Patient afebrile, non-tachycardic, nontachypneic, non-hypoxic, well-appearing and in no acute distress.  In regard to patient's laceration: Tdap booster given. Wound cleaning complete with pressure irrigation, bottom of wound visualized, no foreign bodies appreciated. Laceration occurred < 8 hours prior to repair which was well tolerated. Pt has no co morbidities to effect normal wound healing. Discussed suture home care w pt and answered questions. Pt to f-u for wound check and suture removal in 7 days. Pt is hemodynamically stable w no complaints prior to dc.   Patient's lab work unremarkable for hyponatremia. This is repleted here. Patient reporting ongoing weakness, however she does not endorse any new weakness or the past several days. Patient states she does not believe this weakness has contributed to her fall. Patient's neuro exam is benign without deficit.  Patient able to ambulate with her walker throughout the ED without difficulty. Offered patient admission versus discharge home based on this hyponatremia. Patient states she much preferred to go home, and states "I feel fine". I spoke with Dr. Arelia Longest with [facility] to see if patient would be able to  follow-up within the next several days in her office with her PCP, Dr. Joya Salm. Dr. Arelia Longest states they will be able to see her and call her in the morning. I discussed return precautions with patient, and patient verbalized understanding and agreement of this plan to follow-up with her PCP tomorrow. I encouraged patient call or return the ER with any worsening of symptoms or should she have any questions or concerns.  BP 113/50 mmHg  Pulse 70  Temp(Src) 97.5 F (36.4 C)  Resp 16  Wt 112 lb (50.803 kg)  SpO2 93%  Signed,  Dahlia Bailiff, PA-C 1:47 AM   Pt seen and discussed with Dr. Ernestina Patches, MD     Dahlia Bailiff, PA-C 06/27/14 0174  Ernestina Patches, MD 06/27/14 9449

## 2014-06-26 NOTE — ED Notes (Signed)
PA at bedside suturing.

## 2014-06-26 NOTE — Discharge Instructions (Signed)
Sutured Wound Care Sutures are stitches that can be used to close wounds. Wound care helps prevent pain and infection.  HOME CARE INSTRUCTIONS   Rest and elevate the injured area until all the pain and swelling are gone.  Only take over-the-counter or prescription medicines for pain, discomfort, or fever as directed by your caregiver.  After 48 hours, gently wash the area with mild soap and water once a day, or as directed. Rinse off the soap. Pat the area dry with a clean towel. Do not rub the wound. This may cause bleeding.  Follow your caregiver's instructions for how often to change the bandage (dressing). Stop using a dressing after 2 days or after the wound stops draining.  If the dressing sticks, moisten it with soapy water and gently remove it.  Apply ointment on the wound as directed.  Avoid stretching a sutured wound.  Drink enough fluids to keep your urine clear or pale yellow.  Follow up with your caregiver for suture removal as directed.  Use sunscreen on your wound for the next 3 to 6 months so the scar will not darken. SEEK IMMEDIATE MEDICAL CARE IF:   Your wound becomes red, swollen, hot, or tender.  You have increasing pain in the wound.  You have a red streak that extends from the wound.  There is pus coming from the wound.  You have a fever.  You have shaking chills.  There is a bad smell coming from the wound.  You have persistent bleeding from the wound. MAKE SURE YOU:   Understand these instructions.  Will watch your condition.  Will get help right away if you are not doing well or get worse. Document Released: 04/24/2004 Document Revised: 06/09/2011 Document Reviewed: 07/21/2010 [facility] Patient Information 2015 [place], [place]. This information is not intended to replace advice given to you by your health care provider. Make sure you discuss any questions you have with your health care provider.   Head Injury You have received a head injury.  It does not appear serious at this time. Headaches and vomiting are common following head injury. It should be easy to awaken from sleeping. Sometimes it is necessary for you to stay in the emergency department for a while for observation. Sometimes admission to the hospital may be needed. After injuries such as yours, most problems occur within the first 24 hours, but side effects may occur up to 7-10 days after the injury. It is important for you to carefully monitor your condition and contact your health care provider or seek immediate medical care if there is a change in your condition. WHAT ARE THE TYPES OF HEAD INJURIES? Head injuries can be as minor as a bump. Some head injuries can be more severe. More severe head injuries include:  A jarring injury to the brain (concussion).  A bruise of the brain (contusion). This mean there is bleeding in the brain that can cause swelling.  A cracked skull (skull fracture).  Bleeding in the brain that collects, clots, and forms a bump (hematoma). WHAT CAUSES A HEAD INJURY? A serious head injury is most likely to happen to someone who is in a car wreck and is not wearing a seat belt. Other causes of major head injuries include bicycle or motorcycle accidents, sports injuries, and falls. HOW ARE HEAD INJURIES DIAGNOSED? A complete history of the event leading to the injury and your current symptoms will be helpful in diagnosing head injuries. Many times, pictures of the brain, such  as CT or MRI are needed to see the extent of the injury. Often, an overnight hospital stay is necessary for observation.  WHEN SHOULD I SEEK IMMEDIATE MEDICAL CARE?  You should get help right away if:  You have confusion or drowsiness.  You feel sick to your stomach (nauseous) or have continued, forceful vomiting.  You have dizziness or unsteadiness that is getting worse.  You have severe, continued headaches not relieved by medicine. Only take over-the-counter or  prescription medicines for pain, fever, or discomfort as directed by your health care provider.  You do not have normal function of the arms or legs or are unable to walk.  You notice changes in the black spots in the center of the colored part of your eye (pupil).  You have a clear or bloody fluid coming from your nose or ears.  You have a loss of vision. During the next 24 hours after the injury, you must stay with someone who can watch you for the warning signs. This person should contact local emergency services (911 in the [place] if you have seizures, you become unconscious, or you are unable to wake up. HOW CAN I PREVENT A HEAD INJURY IN THE FUTURE? The most important factor for preventing major head injuries is avoiding motor vehicle accidents. To minimize the potential for damage to your head, it is crucial to wear seat belts while riding in motor vehicles. Wearing helmets while bike riding and playing collision sports (like football) is also helpful. Also, avoiding dangerous activities around the house will further help reduce your risk of head injury.  WHEN CAN I RETURN TO NORMAL ACTIVITIES AND ATHLETICS? You should be reevaluated by your health care provider before returning to these activities. If you have any of the following symptoms, you should not return to activities or contact sports until 1 week after the symptoms have stopped:  Persistent headache.  Dizziness or vertigo.  Poor attention and concentration.  Confusion.  Memory problems.  Nausea or vomiting.  Fatigue or tire easily.  Irritability.  Intolerant of bright lights or loud noises.  Anxiety or depression.  Disturbed sleep. MAKE SURE YOU:   Understand these instructions.  Will watch your condition.  Will get help right away if you are not doing well or get worse. Document Released: 03/17/2005 Document Revised: 03/22/2013 Document Reviewed: 11/22/2012 [facility] Patient Information 2015 [place],  [place]. This information is not intended to replace advice given to you by your health care provider. Make sure you discuss any questions you have with your health care provider.  Hyponatremia  Hyponatremia is when the amount of salt (sodium) in your blood is too low. When sodium levels are low, your cells will absorb extra water and swell. The swelling happens throughout the body, but it mostly affects the brain. Severe brain swelling (cerebral edema), seizures, or coma can happen.  CAUSES   Heart, kidney, or liver problems.  Thyroid problems.  Adrenal gland problems.  Severe vomiting and diarrhea.  Certain medicines or illegal drugs.  Dehydration.  Drinking too much water.  Low-sodium diet. SYMPTOMS   Nausea and vomiting.  Confusion.  Lethargy.  Agitation.  Headache.  Twitching or shaking (seizures).  Unconsciousness.  Appetite loss.  Muscle weakness and cramping. DIAGNOSIS  Hyponatremia is identified by a simple blood test. Your caregiver will perform a history and physical exam to try to find the cause and type of hyponatremia. Other tests may be needed to measure the amount of sodium in your blood  and urine. TREATMENT  Treatment will depend on the cause.   Fluids may be given through the vein (IV).  Medicines may be used to correct the sodium imbalance. If medicines are causing the problem, they will need to be adjusted.  Water or fluid intake may be restricted to restore proper balance. The speed of correcting the sodium problem is very important. If the problem is corrected too fast, nerve damage (sometimes unchangeable) can happen. HOME CARE INSTRUCTIONS   Only take medicines as directed by your caregiver. Many medicines can make hyponatremia worse. Discuss all your medicines with your caregiver.  Carefully follow any recommended diet, including any fluid restrictions.  You may be asked to repeat lab tests. Follow these directions.  Avoid alcohol and  recreational drugs. SEEK MEDICAL CARE IF:   You develop worsening nausea, fatigue, headache, confusion, or weakness.  Your original hyponatremia symptoms return.  You have problems following the recommended diet. SEEK IMMEDIATE MEDICAL CARE IF:   You have a seizure.  You faint.  You have ongoing diarrhea or vomiting. MAKE SURE YOU:   Understand these instructions.  Will watch your condition.  Will get help right away if you are not doing well or get worse. Document Released: 03/07/2002 Document Revised: 06/09/2011 Document Reviewed: 09/01/2010 [facility] Patient Information 2015 [place], [place]. This information is not intended to replace advice given to you by your health care provider. Make sure you discuss any questions you have with your health care provider.

## 2014-06-26 NOTE — ED Notes (Signed)
Verbal order Noland Fordyce PA for CT of head

## 2014-06-27 DIAGNOSIS — E871 Hypo-osmolality and hyponatremia: Secondary | ICD-10-CM | POA: Insufficient documentation

## 2014-07-03 ENCOUNTER — Other Ambulatory Visit: Payer: Self-pay | Admitting: Internal Medicine

## 2014-07-03 NOTE — Telephone Encounter (Signed)
Rx(s) sent to pharmacy electronically.  

## 2014-07-07 ENCOUNTER — Ambulatory Visit (INDEPENDENT_AMBULATORY_CARE_PROVIDER_SITE_OTHER): Payer: Medicare PPO | Admitting: Pharmacist Clinician (PhC)/ Clinical Pharmacy Specialist

## 2014-07-07 DIAGNOSIS — I480 Paroxysmal atrial fibrillation: Secondary | ICD-10-CM | POA: Diagnosis not present

## 2014-07-07 DIAGNOSIS — I4891 Unspecified atrial fibrillation: Secondary | ICD-10-CM | POA: Diagnosis not present

## 2014-07-07 DIAGNOSIS — Z7901 Long term (current) use of anticoagulants: Secondary | ICD-10-CM

## 2014-07-07 LAB — POCT INR: INR: 2.1

## 2014-08-04 ENCOUNTER — Ambulatory Visit (INDEPENDENT_AMBULATORY_CARE_PROVIDER_SITE_OTHER): Payer: Medicare PPO | Admitting: Pharmacist Clinician (PhC)/ Clinical Pharmacy Specialist

## 2014-08-04 DIAGNOSIS — Z7901 Long term (current) use of anticoagulants: Secondary | ICD-10-CM | POA: Diagnosis not present

## 2014-08-04 DIAGNOSIS — I480 Paroxysmal atrial fibrillation: Secondary | ICD-10-CM

## 2014-08-04 DIAGNOSIS — I4891 Unspecified atrial fibrillation: Secondary | ICD-10-CM

## 2014-08-04 LAB — POCT INR: INR: 1.9

## 2014-08-25 ENCOUNTER — Ambulatory Visit (INDEPENDENT_AMBULATORY_CARE_PROVIDER_SITE_OTHER): Payer: Medicare PPO | Admitting: Pharmacist Clinician (PhC)/ Clinical Pharmacy Specialist

## 2014-08-25 DIAGNOSIS — Z7901 Long term (current) use of anticoagulants: Secondary | ICD-10-CM | POA: Diagnosis not present

## 2014-08-25 DIAGNOSIS — I480 Paroxysmal atrial fibrillation: Secondary | ICD-10-CM

## 2014-08-25 DIAGNOSIS — I4891 Unspecified atrial fibrillation: Secondary | ICD-10-CM | POA: Diagnosis not present

## 2014-08-25 LAB — POCT INR: INR: 2.2

## 2014-09-22 ENCOUNTER — Ambulatory Visit (INDEPENDENT_AMBULATORY_CARE_PROVIDER_SITE_OTHER): Payer: Medicare PPO | Admitting: Pharmacist

## 2014-09-22 ENCOUNTER — Ambulatory Visit: Payer: Medicare PPO | Admitting: Pharmacist Clinician (PhC)/ Clinical Pharmacy Specialist

## 2014-09-22 DIAGNOSIS — I4891 Unspecified atrial fibrillation: Secondary | ICD-10-CM | POA: Diagnosis not present

## 2014-09-22 DIAGNOSIS — I480 Paroxysmal atrial fibrillation: Secondary | ICD-10-CM

## 2014-09-22 DIAGNOSIS — Z7901 Long term (current) use of anticoagulants: Secondary | ICD-10-CM | POA: Diagnosis not present

## 2014-09-22 LAB — POCT INR: INR: 2.3

## 2014-11-03 ENCOUNTER — Ambulatory Visit (INDEPENDENT_AMBULATORY_CARE_PROVIDER_SITE_OTHER): Payer: Medicare PPO | Admitting: Pharmacist Clinician (PhC)/ Clinical Pharmacy Specialist

## 2014-11-03 DIAGNOSIS — I4891 Unspecified atrial fibrillation: Secondary | ICD-10-CM

## 2014-11-03 DIAGNOSIS — Z7901 Long term (current) use of anticoagulants: Secondary | ICD-10-CM | POA: Diagnosis not present

## 2014-11-03 DIAGNOSIS — I480 Paroxysmal atrial fibrillation: Secondary | ICD-10-CM | POA: Diagnosis not present

## 2014-11-03 LAB — POCT INR: INR: 1.9

## 2014-11-04 ENCOUNTER — Other Ambulatory Visit: Payer: Self-pay | Admitting: Internal Medicine

## 2014-12-15 ENCOUNTER — Ambulatory Visit (INDEPENDENT_AMBULATORY_CARE_PROVIDER_SITE_OTHER): Payer: Medicare PPO | Admitting: Pharmacist Clinician (PhC)/ Clinical Pharmacy Specialist

## 2014-12-15 DIAGNOSIS — I4891 Unspecified atrial fibrillation: Secondary | ICD-10-CM | POA: Diagnosis not present

## 2014-12-15 DIAGNOSIS — I480 Paroxysmal atrial fibrillation: Secondary | ICD-10-CM | POA: Diagnosis not present

## 2014-12-15 DIAGNOSIS — Z7901 Long term (current) use of anticoagulants: Secondary | ICD-10-CM

## 2014-12-15 LAB — POCT INR: INR: 1.7

## 2015-01-01 ENCOUNTER — Ambulatory Visit (INDEPENDENT_AMBULATORY_CARE_PROVIDER_SITE_OTHER): Payer: Medicare PPO | Admitting: Pharmacist Clinician (PhC)/ Clinical Pharmacy Specialist

## 2015-01-01 DIAGNOSIS — I480 Paroxysmal atrial fibrillation: Secondary | ICD-10-CM | POA: Diagnosis not present

## 2015-01-01 DIAGNOSIS — Z7901 Long term (current) use of anticoagulants: Secondary | ICD-10-CM

## 2015-01-01 DIAGNOSIS — I4891 Unspecified atrial fibrillation: Secondary | ICD-10-CM

## 2015-01-01 LAB — POCT INR: INR: 2.2

## 2015-01-15 ENCOUNTER — Ambulatory Visit: Payer: Medicare PPO | Admitting: Pharmacist Clinician (PhC)/ Clinical Pharmacy Specialist

## 2015-01-22 ENCOUNTER — Ambulatory Visit (INDEPENDENT_AMBULATORY_CARE_PROVIDER_SITE_OTHER): Payer: Medicare PPO | Admitting: Pharmacist Clinician (PhC)/ Clinical Pharmacy Specialist

## 2015-01-22 DIAGNOSIS — I480 Paroxysmal atrial fibrillation: Secondary | ICD-10-CM

## 2015-01-22 DIAGNOSIS — I4891 Unspecified atrial fibrillation: Secondary | ICD-10-CM

## 2015-01-22 DIAGNOSIS — Z7901 Long term (current) use of anticoagulants: Secondary | ICD-10-CM

## 2015-01-22 LAB — POCT INR: INR: 2.4

## 2015-02-19 ENCOUNTER — Ambulatory Visit (INDEPENDENT_AMBULATORY_CARE_PROVIDER_SITE_OTHER): Payer: Medicare PPO | Admitting: Pharmacist Clinician (PhC)/ Clinical Pharmacy Specialist

## 2015-02-19 DIAGNOSIS — Z7901 Long term (current) use of anticoagulants: Secondary | ICD-10-CM | POA: Diagnosis not present

## 2015-02-19 DIAGNOSIS — I4891 Unspecified atrial fibrillation: Secondary | ICD-10-CM | POA: Diagnosis not present

## 2015-02-19 DIAGNOSIS — I480 Paroxysmal atrial fibrillation: Secondary | ICD-10-CM | POA: Diagnosis not present

## 2015-02-19 LAB — POCT INR: INR: 2.4

## 2015-03-09 ENCOUNTER — Encounter (HOSPITAL_COMMUNITY): Payer: Self-pay | Admitting: Emergency Medicine

## 2015-03-09 ENCOUNTER — Emergency Department (HOSPITAL_COMMUNITY)
Admission: EM | Admit: 2015-03-09 | Discharge: 2015-03-09 | Disposition: A | Discharge status: Home / Self Care | Payer: Medicare PPO | Attending: Emergency Medicine | Admitting: Emergency Medicine

## 2015-03-09 ENCOUNTER — Emergency Department (HOSPITAL_COMMUNITY): Payer: Medicare PPO

## 2015-03-09 DIAGNOSIS — M545 Low back pain, unspecified: Secondary | ICD-10-CM

## 2015-03-09 DIAGNOSIS — M810 Age-related osteoporosis without current pathological fracture: Secondary | ICD-10-CM | POA: Insufficient documentation

## 2015-03-09 DIAGNOSIS — Z79899 Other long term (current) drug therapy: Secondary | ICD-10-CM | POA: Insufficient documentation

## 2015-03-09 DIAGNOSIS — S32020A Wedge compression fracture of second lumbar vertebra, initial encounter for closed fracture: Secondary | ICD-10-CM | POA: Insufficient documentation

## 2015-03-09 DIAGNOSIS — Z7901 Long term (current) use of anticoagulants: Secondary | ICD-10-CM | POA: Insufficient documentation

## 2015-03-09 DIAGNOSIS — N12 Tubulo-interstitial nephritis, not specified as acute or chronic: Secondary | ICD-10-CM | POA: Insufficient documentation

## 2015-03-09 DIAGNOSIS — E039 Hypothyroidism, unspecified: Secondary | ICD-10-CM | POA: Diagnosis not present

## 2015-03-09 DIAGNOSIS — Y9389 Activity, other specified: Secondary | ICD-10-CM | POA: Insufficient documentation

## 2015-03-09 DIAGNOSIS — X58XXXA Exposure to other specified factors, initial encounter: Secondary | ICD-10-CM | POA: Diagnosis not present

## 2015-03-09 DIAGNOSIS — Z8719 Personal history of other diseases of the digestive system: Secondary | ICD-10-CM | POA: Insufficient documentation

## 2015-03-09 DIAGNOSIS — Z9889 Other specified postprocedural states: Secondary | ICD-10-CM | POA: Insufficient documentation

## 2015-03-09 DIAGNOSIS — I10 Essential (primary) hypertension: Secondary | ICD-10-CM | POA: Insufficient documentation

## 2015-03-09 DIAGNOSIS — Y9289 Other specified places as the place of occurrence of the external cause: Secondary | ICD-10-CM | POA: Insufficient documentation

## 2015-03-09 DIAGNOSIS — Z8701 Personal history of pneumonia (recurrent): Secondary | ICD-10-CM | POA: Diagnosis not present

## 2015-03-09 DIAGNOSIS — J449 Chronic obstructive pulmonary disease, unspecified: Secondary | ICD-10-CM | POA: Diagnosis not present

## 2015-03-09 DIAGNOSIS — Z87891 Personal history of nicotine dependence: Secondary | ICD-10-CM | POA: Insufficient documentation

## 2015-03-09 DIAGNOSIS — I4891 Unspecified atrial fibrillation: Secondary | ICD-10-CM | POA: Diagnosis not present

## 2015-03-09 DIAGNOSIS — F419 Anxiety disorder, unspecified: Secondary | ICD-10-CM | POA: Insufficient documentation

## 2015-03-09 DIAGNOSIS — Z8669 Personal history of other diseases of the nervous system and sense organs: Secondary | ICD-10-CM | POA: Diagnosis not present

## 2015-03-09 DIAGNOSIS — Y998 Other external cause status: Secondary | ICD-10-CM | POA: Insufficient documentation

## 2015-03-09 DIAGNOSIS — M439 Deforming dorsopathy, unspecified: Secondary | ICD-10-CM

## 2015-03-09 LAB — CBC WITH DIFFERENTIAL/PLATELET
BASOS ABS: 0 10*3/uL (ref 0.0–0.1)
Basophils Relative: 0 %
Eosinophils Absolute: 0.2 10*3/uL (ref 0.0–0.7)
Eosinophils Relative: 2 %
HCT: 41.6 % (ref 36.0–46.0)
Hemoglobin: 14.6 g/dL (ref 12.0–15.0)
Lymphocytes Relative: 18 %
Lymphs Abs: 1.8 10*3/uL (ref 0.7–4.0)
MCH: 33.5 pg (ref 26.0–34.0)
MCHC: 35.1 g/dL (ref 30.0–36.0)
MCV: 95.4 fL (ref 78.0–100.0)
MONO ABS: 0.6 10*3/uL (ref 0.1–1.0)
Monocytes Relative: 6 %
Neutro Abs: 7.9 10*3/uL — ABNORMAL HIGH (ref 1.7–7.7)
Neutrophils Relative %: 74 %
PLATELETS: 282 10*3/uL (ref 150–400)
RBC: 4.36 MIL/uL (ref 3.87–5.11)
RDW: 13.9 % (ref 11.5–15.5)
WBC: 10.5 10*3/uL (ref 4.0–10.5)

## 2015-03-09 LAB — COMPREHENSIVE METABOLIC PANEL
ALT: 16 U/L (ref 14–54)
ANION GAP: 10 (ref 5–15)
AST: 22 U/L (ref 15–41)
Albumin: 3.9 g/dL (ref 3.5–5.0)
Alkaline Phosphatase: 105 U/L (ref 38–126)
BUN: 13 mg/dL (ref 6–20)
CHLORIDE: 96 mmol/L — AB (ref 101–111)
CO2: 29 mmol/L (ref 22–32)
Calcium: 9 mg/dL (ref 8.9–10.3)
Creatinine, Ser: 0.51 mg/dL (ref 0.44–1.00)
GFR calc non Af Amer: >60 mL/min (ref >60)
GLUCOSE: 91 mg/dL (ref 65–99)
POTASSIUM: 3.8 mmol/L (ref 3.5–5.1)
SODIUM: 135 mmol/L (ref 135–145)
Total Bilirubin: 1.3 mg/dL — ABNORMAL HIGH (ref 0.3–1.2)
Total Protein: 6.7 g/dL (ref 6.5–8.1)

## 2015-03-09 LAB — URINE MICROSCOPIC-ADD ON

## 2015-03-09 LAB — URINALYSIS, ROUTINE W REFLEX MICROSCOPIC
Bilirubin Urine: NEGATIVE
GLUCOSE, UA: NEGATIVE mg/dL
Hgb urine dipstick: NEGATIVE
KETONES UR: NEGATIVE mg/dL
NITRITE: POSITIVE — AB
PH: 7.5 (ref 5.0–8.0)
Protein, ur: NEGATIVE mg/dL
SPECIFIC GRAVITY, URINE: 1.011 (ref 1.005–1.030)

## 2015-03-09 LAB — PROTIME-INR
INR: 1.7 — ABNORMAL HIGH (ref 0.00–1.49)
Prothrombin Time: 20 seconds — ABNORMAL HIGH (ref 11.6–15.2)

## 2015-03-09 MED ORDER — IOHEXOL 300 MG/ML  SOLN
80.0000 mL | Freq: Once | INTRAMUSCULAR | Status: AC | PRN
  Administered 2015-03-09: 80 mL via INTRAVENOUS

## 2015-03-09 MED ORDER — HYDROCODONE-ACETAMINOPHEN 5-325 MG PO TABS: 1.0000 | ORAL_TABLET | Freq: Four times a day (QID) | ORAL | Status: DC | PRN

## 2015-03-09 MED ORDER — CEPHALEXIN 500 MG PO CAPS: 500.0000 mg | ORAL_CAPSULE | Freq: Four times a day (QID) | ORAL | Status: DC

## 2015-03-09 MED ORDER — HYDROCODONE-ACETAMINOPHEN 5-325 MG PO TABS
1.0000 | ORAL_TABLET | Freq: Once | ORAL | Status: AC
Start: 2015-03-09 — End: 2015-03-09
  Administered 2015-03-09: 1 via ORAL
  Filled 2015-03-09: qty 1

## 2015-03-09 NOTE — ED Notes (Signed)
Phlebotomist at bedside.

## 2015-03-09 NOTE — ED Notes (Signed)
Pt to CT

## 2015-03-09 NOTE — ED Notes (Signed)
Liu at bedside.

## 2015-03-09 NOTE — Discharge Instructions (Signed)
Please take antibiotics as prescribed. Return without fail for worsening symptoms, including worsening pain, difficulty walking, new numbness/weakness, loss of control of bowel or bladder, or any other symptoms concerning to you. Please call your primary care provider for follow-up with in the next few days.   Pyelonephritis, Adult Pyelonephritis is a kidney infection. The kidneys are organs that help clean your blood by moving waste out of your blood and into your pee (urine). This infection can happen quickly, or it can last for a long time. In most cases, it clears up with treatment and does not cause other problems. HOME CARE Medicines  Take over-the-counter and prescription medicines only as told by your doctor.  Take your antibiotic medicine as told by your doctor. Do not stop taking the medicine even if you start to feel better. General Instructions  Drink enough fluid to keep your pee clear or pale yellow.  Avoid caffeine, tea, and carbonated drinks.  Pee (urinate) often. Avoid holding in pee for long periods of time.  Pee before and after sex.  After pooping (having a bowel movement), women should wipe from front to back. Use each tissue only once.  Keep all follow-up visits as told by your doctor. This is important. GET HELP IF:  You do not feel better after 2 days.  Your symptoms get worse.  You have a fever. GET HELP RIGHT AWAY IF:  You cannot take your medicine or drink fluids as told.  You have chills and shaking.  You throw up (vomit).  You have very bad pain in your side (flank) or back.  You feel very weak or you pass out (faint).   This information is not intended to replace advice given to you by your health care provider. Make sure you discuss any questions you have with your health care provider.   Document Released: 04/24/2004 Document Revised: 12/06/2014 Document Reviewed: 07/10/2014 Elsevier Interactive Patient Education 2016 Elsevier  Inc.  Back Pain, Adult Back pain is very common. The pain often gets better over time. The cause of back pain is usually not dangerous. Most people can learn to manage their back pain on their own.  HOME CARE  Watch your back pain for any changes. The following actions may help to lessen any pain you are feeling:  Stay active. Start with short walks on flat ground if you can. Try to walk farther each day.  Exercise regularly as told by your doctor. Exercise helps your back heal faster. It also helps avoid future injury by keeping your muscles strong and flexible.  Do not sit, drive, or stand in one place for more than 30 minutes.  Do not stay in bed. Resting more than 1-2 days can slow down your recovery.  Be careful when you bend or lift an object. Use good form when lifting:  Bend at your knees.  Keep the object close to your body.  Do not twist.  Sleep on a firm mattress. Lie on your side, and bend your knees. If you lie on your back, put a pillow under your knees.  Take medicines only as told by your doctor.  Put ice on the injured area.  Put ice in a plastic bag.  Place a towel between your skin and the bag.  Leave the ice on for 20 minutes, 2-3 times a day for the first 2-3 days. After that, you can switch between ice and heat packs.  Avoid feeling anxious or stressed. Find good ways to deal  with stress, such as exercise.  Maintain a healthy weight. Extra weight puts stress on your back. GET HELP IF:   You have pain that does not go away with rest or medicine.  You have worsening pain that goes down into your legs or buttocks.  You have pain that does not get better in one week.  You have pain at night.  You lose weight.  You have a fever or chills. GET HELP RIGHT AWAY IF:   You cannot control when you poop (bowel movement) or pee (urinate).  Your arms or legs feel weak.  Your arms or legs lose feeling (numbness).  You feel sick to your stomach  (nauseous) or throw up (vomit).  You have belly (abdominal) pain.  You feel like you may pass out (faint).   This information is not intended to replace advice given to you by your health care provider. Make sure you discuss any questions you have with your health care provider.   Document Released: 09/03/2007 Document Revised: 04/07/2014 Document Reviewed: 07/19/2013 Elsevier Interactive Patient Education [employer].

## 2015-03-09 NOTE — ED Notes (Signed)
Per EMS- Patient c/o worsening lower back pain x 4 days. Patient describes pain as "grabbing" and pain only occurs when she tries to ambulate. Patient denies any falls or injuries. Patient reports a history of back surgery in the 1990's.

## 2015-03-09 NOTE — ED Notes (Signed)
In addition to below note, pt states she is ambulatory without assistance. Denies urinary symptoms, denies bowel or bladder incontinence. States left back pain radiates to mid back and only occurs while ambulating, is absent at rest.

## 2015-03-09 NOTE — ED Notes (Signed)
Pt ambulated in hallway with assistance; pt states "I can handle this. I feel good."

## 2015-03-09 NOTE — ED Notes (Signed)
Bed: [facility] Expected date:  Expected time:  Means of arrival:  Comments: [id number] back pain

## 2015-03-09 NOTE — ED Notes (Signed)
Pt assisted to restroom.  

## 2015-03-09 NOTE — ED Provider Notes (Signed)
CSN: [id number]     Arrival date & time 03/09/15  1141 History   First MD Initiated Contact with Patient 03/09/15 1228     Chief Complaint  Patient presents with  . Back Pain     (Consider location/radiation/quality/duration/timing/severity/associated sxs/prior Treatment) HPI 81 year old female with history of atrial fibrillation on Coumadin and remote history of lumbar spine surgery who presents with low back pain. States that 5 days ago she had sudden onset of right-sided low back pain. Denies any preceding trauma, falls, heavy lifting, or exertional activity. Is unsure of what had caused her pain, but progressively worsening. States that intermittently she has had urinary frequency, but denies dysuria. Has not had associated abdominal pain, fever, chills, nausea, vomiting. States that with her prior surgeries on her lumbar spine she has residual weakness in her bilateral lower extremities, right greater than left. States this is unchanged. Denies any new numbness or weakness, urinary retention, bowel incontinence, or urinary incontinence.   Past Medical History  Diagnosis Date  . Atrial fibrillation ([place])   . 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 COPD (chronic obstructive pulmonary disease) ([place])   . Essential hypertension, benign   . Unspecified hypothyroidism   . Gallstones   . Peripheral neuropathy ([place])   . Venous insufficiency   . Osteoporosis   . Pneumonia     states she had it twice, last time a couple of months ago  . Shortness of breath     with exertion  . GERD (gastroesophageal reflux disease)   . H/O hiatal hernia   . Arthritis   . Anxiety   . Mild aortic insufficiency   . History of nuclear stress test 10/2010    dipyridamole; normal pattern of perfusion; low risk, normal study  . PONV (postoperative nausea and vomiting)   . Dysrhythmia     AF   Past Surgical History  Procedure Laterality Date  . Total abdominal hysterectomy   1970  . Tonsillectomy  1935  . Left hip relacement  2000  . Back surgery      x2  . Colon surgery  2008    partial  . Appendectomy  1935  . Cataract extraction w/ intraocular lens  implant, bilateral    . Ventral hernia repair  09/19/2011    Procedure: LAPAROSCOPIC VENTRAL HERNIA;  Surgeon: Merrie Roof, MD;  Location: [facility];  Service: General;  Laterality: N/A;  laparoscopic ventral hernia repair with mesh  . Cardiometablic testing  7/98/9211    submaximal effort with peak RER of 0.5, peak VO2 79% predicted; HR peak up to 78%; PVC was 55% predicted, PEV1 39% predicted; PEV1/VC ratio was reduced; normal vital capacity; DLCO was reduced to 63%  . Transthoracic echocardiogram  05/2011    EF=>55%; mild conc LVH; mild mitral annular calcif; mild TR; AV mildly sclerotic & mild AR  . Hernia repair    . Carpal tunnel release    . Joint replacement    . Cholecystectomy  04/07/2014    dr Marlou Starks  . Cholecystectomy N/A 04/07/2014    Procedure: LAPAROSCOPIC CHOLECYSTECTOMY WITH INTRAOPERATIVE CHOLANGIOGRAM POSSBILE OPEN;  Surgeon: Autumn Messing III, MD;  Location: [facility];  Service: General;  Laterality: N/A;   Family History  Problem Relation Age of Onset  . Stroke Mother   . Stroke Father   . Heart block Brother   . Bladder Cancer Brother   . Diabetes Brother   . Stroke Brother    Social History  Substance Use Topics  . Smoking status: Former Smoker -- 1.00 packs/day for 20 years    Types: Cigarettes    Quit date: 09/11/1988  . Smokeless tobacco: Never Used  . Alcohol Use: 0.0 oz/week    0 Standard drinks or equivalent per week     Comment: occ   OB History    No data available     Review of Systems 10/14 systems reviewed and are negative other than those stated in the HPI    Allergies  Ace inhibitors and Sulfur  Home Medications   Prior to Admission medications   Medication Sig Start Date End Date Taking? Authorizing Provider  albuterol (PROVENTIL HFA;VENTOLIN HFA) 108 (90  BASE) MCG/ACT inhaler Inhale 2 puffs into the lungs every 6 (six) hours as needed for wheezing or shortness of breath. 05/23/13  Yes Burnard Bunting, MD  amitriptyline (ELAVIL) 25 MG tablet Take 25 mg by mouth at bedtime.   Yes Historical Provider, MD  calcium citrate-vitamin D (CITRACAL+D) 315-200 MG-UNIT per tablet Take 1 tablet by mouth daily.   Yes Historical Provider, MD  diltiazem (CARDIZEM CD) 180 MG 24 hr capsule Take 180 mg by mouth daily.   Yes Historical Provider, MD  docusate sodium (COLACE) 100 MG capsule Take 200 mg by mouth daily as needed for mild constipation.   Yes Historical Provider, MD  hydrochlorothiazide (HYDRODIURIL) 25 MG tablet TAKE ONE TABLET BY MOUTH ONCE DAILY Patient taking differently: TAKE ONE TABLET BY MOUTH on Monday, Wednesday and Friday 04/03/14  Yes Pixie Casino, MD  levothyroxine (SYNTHROID, LEVOTHROID) 137 MCG tablet Take 137 mcg by mouth daily before breakfast.    Yes Historical Provider, MD  losartan (COZAAR) 100 MG tablet TAKE 1 TABLET BY MOUTH ONCE A DAY 07/03/14  Yes Pixie Casino, MD  Multiple Vitamins-Minerals (MULTIVITAMIN WITH MINERALS) tablet Take 1 tablet by mouth daily.   Yes Historical Provider, MD  naphazoline-glycerin (CLEAR EYES) 0.012-0.2 % SOLN Place 1-2 drops into both eyes daily.    Yes Historical Provider, MD  polyethylene glycol (MIRALAX / GLYCOLAX) packet Take 17 g by mouth daily as needed for moderate constipation or severe constipation.   Yes Historical Provider, MD  warfarin (COUMADIN) 2.5 MG tablet TAKE ONE TO ONE & ONE-HALF TABLETS BY MOUTH ONCE DAILY AS DIRECTED Patient taking differently: TAKE 2.'5mg'$  BY MOUTH ONCE DAILY AS DIRECTED 11/06/14  Yes Pixie Casino, MD  gabapentin (NEURONTIN) 300 MG capsule Take 1 capsule (300 mg total) by mouth 3 (three) times daily. Patient not taking: Reported on 06/26/2014 04/19/14   Campbell Riches, MD   BP 141/77 mmHg  Pulse 81  Temp(Src) 98.3 F (36.8 C) (Oral)  Resp 18  SpO2 92% Physical  Exam Physical Exam  Nursing note and vitals reviewed. Constitutional: Well developed, well nourished, non-toxic, and in no acute distress Head: Normocephalic and atraumatic.  Mouth/Throat: Oropharynx is clear and moist.  Neck: Normal range of motion. Neck supple.  Cardiovascular: Normal rate and regular rhythm.    Pulmonary/Chest: Effort normal and breath sounds normal.  Abdominal: Soft. There is no tenderness. There is no rebound and no guarding. Left CVA tenderness, but also tender to palpation over the right paraspinal muscles. Musculoskeletal: Normal range of motion of lower extremities.  Neurological: Alert, no facial droop, fluent speech, sensation to light touch in tact  Skin: Skin is warm and dry.  Psychiatric: Cooperative  ED Course  Procedures (including critical care time) Labs Review Labs Reviewed  CBC WITH DIFFERENTIAL/PLATELET -  Abnormal; Notable for the following:    Neutro Abs 7.9 (*)    All other components within normal limits  COMPREHENSIVE METABOLIC PANEL - Abnormal; Notable for the following:    Chloride 96 (*)    Total Bilirubin 1.3 (*)    All other components within normal limits  URINALYSIS, ROUTINE W REFLEX MICROSCOPIC (NOT AT [facility]) - Abnormal; Notable for the following:    APPearance CLOUDY (*)    Nitrite POSITIVE (*)    Leukocytes, UA MODERATE (*)    All other components within normal limits  PROTIME-INR - Abnormal; Notable for the following:    Prothrombin Time 20.0 (*)    INR 1.70 (*)    All other components within normal limits  URINE MICROSCOPIC-ADD ON - Abnormal; Notable for the following:    Squamous Epithelial / LPF 0-5 (*)    Bacteria, UA MANY (*)    All other components within normal limits  URINE CULTURE    Imaging Review Ct Abdomen Pelvis W Contrast  03/09/2015  CLINICAL DATA:  Worsening low back pain for 4 days especially with ambulation. Prior back surgery. Left flank pain. Anti coagulation. EXAM: CT ABDOMEN AND PELVIS WITH CONTRAST  TECHNIQUE: Multidetector CT imaging of the abdomen and pelvis was performed using the standard protocol following bolus administration of intravenous contrast. CONTRAST:  11m OMNIPAQUE IOHEXOL 300 MG/ML  SOLN COMPARISON:  06/17/2013 FINDINGS: Lower chest: Stable scarring in the lingula and right lower lobe. Stable medial scarring in the left lower lobe. Old healed left posterior and lateral rib fractures. Coronary atherosclerotic calcification. Hepatobiliary: Mild intrahepatic bile duct dilatation. Common bile duct 1.1 cm diameter. No definite radiodense stone in the CBD. Cholecystectomy. Oval-shaped 0.8 by 0.5 cm lesion in the dome of the right hepatic lobe image 5 series 2 likely a cyst or similar benign structure, no change from 06/17/2013. Pancreas: Atrophic. Borderline prominence of the dorsal pancreatic duct. Spleen: Unremarkable Adrenals/Urinary Tract: Fluid density 1.1 cm left mid kidney lesion compatible with cyst. No hydronephrosis or observed hydroureter. Somewhat flaccid urinary bladder. Portions of the ureters are poorly identified specially in the pelvis. Stomach/Bowel: Indistinctness of tissue planes along the gastroesophageal junction, potentially incidental but inflammation in this vicinity is difficult to exclude. Vascular/Lymphatic: Aortoiliac atherosclerotic vascular disease. No pathologic adenopathy. Reproductive: Uterus absent.  Ovaries unremarkable for age. Other: No supplemental non-categorized findings. Musculoskeletal: Left total hip prosthesis. Diffuse bony demineralization. Prominent osteoarthritis of the right hip. Chondrocalcinosis of the pubic symphysis. Chronically fragmented osteophyte along the right greater trochanter. Right eccentric Tarlov cyst at S2. Posterior decompression at L4-5 and L5-S1 with bilateral ray cages at these levels and removal of large parts of the facet joints is specially at L4-5. There is 3 mm of chronic anterolisthesis at L5-S1. When I compared to the prior  MRI of 11/10/2013, there has been mild progression in the inferior endplate compression fracture at L2, previously about 10% loss of vertebral height and currently about 20% loss of vertebral height. In addition there is some linear lucency tracking along the cortex of the inferior endplate suspicious for a subacute fracture component. No associated impingement at this level or other levels. IMPRESSION: 1. Mild progression of the inferior endplate compression fracture at L2, now with about 20% loss of vertebral body height, and with some cortical irregularity suggesting a subacute component of this inferior endplate compression. No posterior element involvement or impingement. 2. No bleeding complication identified. 3. Mild intrahepatic biliary duct dilatation and mildly prominent common bile duct, quite likely  a physiologic response to cholecystectomy. 4. Indistinctness of tissue planes at the gastroesophageal junction, probably incidental, although distal esophageal inflammation is a possibility in the setting of esophagitis. 5. Other imaging findings of potential clinical significance: Bibasilar scarring. Old healed bilateral lower rib fractures. Coronary atherosclerosis. Aortoiliac atherosclerotic vascular disease. Diffuse bony demineralization. Prominent osteoarthritis of the right hip. Electronically Signed   By: Van Clines M.D.   On: 03/09/2015 15:58   I have personally reviewed and evaluated these images and lab results as part of my medical decision-making.    MDM   Final diagnoses:  Left-sided low back pain without sciatica  Pyelonephritis  Compression deformity of vertebra    A 81 year old female with history of atrial fibrillation on Coumadin and prior lumbar spine surgery who presents to emergency department with back pain. He is well-appearing and in no acute distress on arrival. Has stable vital signs. On exam is a soft and benign abdomen. Is neurologically intact, and no  concerning features of spinal cord injury or pathology. She does have reproducible pain with palpation of the left lower paraspinal muscles and also CVA tenderness over at that site as well. CT abdomen and pelvis is performed for evaluation of a retroperitoneal versus lumbar spine versus intra-abdominal pathology given her age and Coumadin use. This is visualized and reviewed with radiology. Evidence of progression of arthritic disease in her back. There is progression of compression deformity at L2. She has no acute retroperitoneal or intra abdominal processes. Her urinalysis does show evidence of a urinary tract infection, with her CVA tenderness may also suggest pyelonephritis. Unclear if her symptoms may be secondary to her compression deformity versus musculoskeletal pain versus pyelonephritis. He was given a dose of Norco in the emergency department, with significant improvement in her pain. She is able to ambulate with steady gait here in the emergency department. Is given a prescription for Keflex for treatment of her urinary tract infection/pyelonephritis, and short course of Norco for pain relief. Referral given for neurosurgery for evaluation of worsening compression deformity as well as follow-up instructions with her primary care doctor for reevaluation within the next few days. Strict return instructions are also reviewed. She expressed understanding of all discharge instructions and felt comfortable with the plan of care.  Forde Dandy, MD 03/09/15 [phone]

## 2015-03-12 LAB — URINE CULTURE

## 2015-04-04 ENCOUNTER — Ambulatory Visit (INDEPENDENT_AMBULATORY_CARE_PROVIDER_SITE_OTHER): Payer: Medicare PPO | Admitting: Pharmacist Clinician (PhC)/ Clinical Pharmacy Specialist

## 2015-04-04 DIAGNOSIS — I480 Paroxysmal atrial fibrillation: Secondary | ICD-10-CM

## 2015-04-04 DIAGNOSIS — Z7901 Long term (current) use of anticoagulants: Secondary | ICD-10-CM | POA: Diagnosis not present

## 2015-04-04 DIAGNOSIS — I4891 Unspecified atrial fibrillation: Secondary | ICD-10-CM

## 2015-04-04 LAB — POCT INR: INR: 4.5

## 2015-04-06 ENCOUNTER — Encounter: Payer: Medicare PPO | Admitting: Pharmacist Clinician (PhC)/ Clinical Pharmacy Specialist

## 2015-04-10 ENCOUNTER — Telehealth: Payer: Self-pay | Admitting: Pharmacist Clinician (PhC)/ Clinical Pharmacy Specialist

## 2015-04-10 NOTE — Telephone Encounter (Signed)
Pt called to report went to PCP and was given cephalexin 500 mg qid; had INR checked while there, was 2.8.  Advised patient to continue with current dose, will move INR appt out 1 week.

## 2015-04-18 ENCOUNTER — Encounter: Payer: Medicare PPO | Admitting: Pharmacist Clinician (PhC)/ Clinical Pharmacy Specialist

## 2015-04-25 ENCOUNTER — Encounter: Payer: Medicare PPO | Admitting: Pharmacist Clinician (PhC)/ Clinical Pharmacy Specialist

## 2015-04-27 ENCOUNTER — Ambulatory Visit (INDEPENDENT_AMBULATORY_CARE_PROVIDER_SITE_OTHER): Payer: Medicare PPO | Admitting: Pharmacist Clinician (PhC)/ Clinical Pharmacy Specialist

## 2015-04-27 DIAGNOSIS — I480 Paroxysmal atrial fibrillation: Secondary | ICD-10-CM | POA: Diagnosis not present

## 2015-04-27 DIAGNOSIS — I4891 Unspecified atrial fibrillation: Secondary | ICD-10-CM | POA: Diagnosis not present

## 2015-04-27 DIAGNOSIS — Z7901 Long term (current) use of anticoagulants: Secondary | ICD-10-CM

## 2015-04-27 LAB — POCT INR: INR: 3.2

## 2015-04-30 ENCOUNTER — Other Ambulatory Visit: Payer: Self-pay | Admitting: Internal Medicine

## 2015-04-30 NOTE — Telephone Encounter (Signed)
REFILL 

## 2015-05-10 ENCOUNTER — Encounter (HOSPITAL_COMMUNITY): Payer: Self-pay

## 2015-05-10 ENCOUNTER — Ambulatory Visit (HOSPITAL_COMMUNITY)
Admission: RE | Admit: 2015-05-10 | Discharge: 2015-05-10 | Disposition: A | Discharge status: Home / Self Care | Payer: Medicare PPO | Source: Ambulatory Visit | Attending: Internal Medicine | Admitting: Internal Medicine

## 2015-05-10 DIAGNOSIS — M810 Age-related osteoporosis without current pathological fracture: Secondary | ICD-10-CM | POA: Insufficient documentation

## 2015-05-10 MED ORDER — ZOLEDRONIC ACID 5 MG/100ML IV SOLN
5.0000 mg | Freq: Once | INTRAVENOUS | Status: AC
  Administered 2015-05-10: 5 mg via INTRAVENOUS
  Filled 2015-05-10: qty 100

## 2015-05-10 MED ORDER — SODIUM CHLORIDE 0.9 % IV SOLN
Freq: Once | INTRAVENOUS | Status: AC
  Administered 2015-05-10: 250 mL via INTRAVENOUS

## 2015-05-10 NOTE — Discharge Instructions (Signed)
Drink fluids/water as tolerated over next 72hrs Tylenol or Ibuprofen OTC as directed Continue calcium and Vit D as directed by your MDZoledronic Acid injection (Paget's Disease, Osteoporosis) What is this medicine? ZOLEDRONIC ACID (ZOE le dron ik AS id) lowers the amount of calcium loss from bone. It is used to treat Paget's disease and osteoporosis in women. This medicine may be used for other purposes; ask your health care provider or pharmacist if you have questions. What should I tell my health care provider before I take this medicine? They need to know if you have any of these conditions: -aspirin-sensitive asthma -cancer, especially if you are receiving medicines used to treat cancer -dental disease or wear dentures -infection -kidney disease -low levels of calcium in the blood -past surgery on the parathyroid gland or intestines -receiving corticosteroids like dexamethasone or prednisone -an unusual or allergic reaction to zoledronic acid, other medicines, foods, dyes, or preservatives -pregnant or trying to get pregnant -breast-feeding How should I use this medicine? This medicine is for infusion into a vein. It is given by a health care professional in a hospital or clinic setting. Talk to your pediatrician regarding the use of this medicine in children. This medicine is not approved for use in children. Overdosage: If you think you have taken too much of this medicine contact a poison control center or emergency room at once. NOTE: This medicine is only for you. Do not share this medicine with others. What if I miss a dose? It is important not to miss your dose. Call your doctor or health care professional if you are unable to keep an appointment. What may interact with this medicine? -certain antibiotics given by injection -NSAIDs, medicines for pain and inflammation, like ibuprofen or naproxen -some diuretics like bumetanide, furosemide -teriparatide This list may not  describe all possible interactions. Give your health care provider a list of all the medicines, herbs, non-prescription drugs, or dietary supplements you use. Also tell them if you smoke, drink alcohol, or use illegal drugs. Some items may interact with your medicine. What should I watch for while using this medicine? Visit your doctor or health care professional for regular checkups. It may be some time before you see the benefit from this medicine. Do not stop taking your medicine unless your doctor tells you to. Your doctor may order blood tests or other tests to see how you are doing. Women should inform their doctor if they wish to become pregnant or think they might be pregnant. There is a potential for serious side effects to an unborn child. Talk to your health care professional or pharmacist for more information. You should make sure that you get enough calcium and vitamin D while you are taking this medicine. Discuss the foods you eat and the vitamins you take with your health care professional. Some people who take this medicine have severe bone, joint, and/or muscle pain. This medicine may also increase your risk for jaw problems or a broken thigh bone. Tell your doctor right away if you have severe pain in your jaw, bones, joints, or muscles. Tell your doctor if you have any pain that does not go away or that gets worse. Tell your dentist and dental surgeon that you are taking this medicine. You should not have major dental surgery while on this medicine. See your dentist to have a dental exam and fix any dental problems before starting this medicine. Take good care of your teeth while on this medicine. Make sure you  see your dentist for regular follow-up appointments. What side effects may I notice from receiving this medicine? Side effects that you should report to your doctor or health care professional as soon as possible: -allergic reactions like skin rash, itching or hives, swelling of  the face, lips, or tongue -anxiety, confusion, or depression -breathing problems -changes in vision -eye pain -feeling faint or lightheaded, falls -jaw pain, especially after dental work -mouth sores -muscle cramps, stiffness, or weakness -redness, blistering, peeling or loosening of the skin, including inside the mouth -trouble passing urine or change in the amount of urine Side effects that usually do not require medical attention (report to your doctor or health care professional if they continue or are bothersome): -bone, joint, or muscle pain -constipation -diarrhea -fever -hair loss -irritation at site where injected -loss of appetite -nausea, vomiting -stomach upset -trouble sleeping -trouble swallowing -weak or tired This list may not describe all possible side effects. Call your doctor for medical advice about side effects. You may report side effects to FDA at 1-800-FDA-1088. Where should I keep my medicine? This drug is given in a hospital or clinic and will not be stored at home. NOTE: This sheet is a summary. It may not cover all possible information. If you have questions about this medicine, talk to your doctor, pharmacist, or health care provider.    2016, Elsevier/[employer]. (2013-08-13 14:19:57)

## 2015-05-18 ENCOUNTER — Ambulatory Visit (INDEPENDENT_AMBULATORY_CARE_PROVIDER_SITE_OTHER): Payer: Medicare PPO | Admitting: Pharmacist Clinician (PhC)/ Clinical Pharmacy Specialist

## 2015-05-18 DIAGNOSIS — I480 Paroxysmal atrial fibrillation: Secondary | ICD-10-CM

## 2015-05-18 DIAGNOSIS — Z7901 Long term (current) use of anticoagulants: Secondary | ICD-10-CM

## 2015-05-18 DIAGNOSIS — I4891 Unspecified atrial fibrillation: Secondary | ICD-10-CM | POA: Diagnosis not present

## 2015-05-18 LAB — POCT INR: INR: 2.8

## 2015-05-19 ENCOUNTER — Other Ambulatory Visit: Payer: Self-pay | Admitting: Internal Medicine

## 2015-06-08 ENCOUNTER — Ambulatory Visit (INDEPENDENT_AMBULATORY_CARE_PROVIDER_SITE_OTHER): Payer: Medicare PPO | Admitting: Pharmacist Clinician (PhC)/ Clinical Pharmacy Specialist

## 2015-06-08 DIAGNOSIS — I480 Paroxysmal atrial fibrillation: Secondary | ICD-10-CM | POA: Diagnosis not present

## 2015-06-08 DIAGNOSIS — Z7901 Long term (current) use of anticoagulants: Secondary | ICD-10-CM

## 2015-06-08 DIAGNOSIS — I4891 Unspecified atrial fibrillation: Secondary | ICD-10-CM | POA: Diagnosis not present

## 2015-06-08 LAB — POCT INR: INR: 2

## 2015-06-30 ENCOUNTER — Other Ambulatory Visit: Payer: Self-pay | Admitting: Internal Medicine

## 2015-07-02 NOTE — Telephone Encounter (Signed)
Rx(s) sent to pharmacy electronically.  

## 2015-07-20 ENCOUNTER — Ambulatory Visit (INDEPENDENT_AMBULATORY_CARE_PROVIDER_SITE_OTHER): Payer: Medicare PPO | Admitting: Pharmacist Clinician (PhC)/ Clinical Pharmacy Specialist

## 2015-07-20 ENCOUNTER — Ambulatory Visit: Payer: Medicare PPO | Admitting: Internal Medicine

## 2015-07-20 DIAGNOSIS — I480 Paroxysmal atrial fibrillation: Secondary | ICD-10-CM | POA: Diagnosis not present

## 2015-07-20 DIAGNOSIS — I4891 Unspecified atrial fibrillation: Secondary | ICD-10-CM

## 2015-07-20 DIAGNOSIS — Z7901 Long term (current) use of anticoagulants: Secondary | ICD-10-CM | POA: Diagnosis not present

## 2015-07-20 LAB — POCT INR: INR: 1.7

## 2015-07-31 ENCOUNTER — Other Ambulatory Visit: Payer: Self-pay | Admitting: Internal Medicine

## 2015-07-31 NOTE — Telephone Encounter (Signed)
REFILL 

## 2015-08-14 ENCOUNTER — Encounter: Payer: Self-pay | Admitting: Internal Medicine

## 2015-08-14 ENCOUNTER — Ambulatory Visit (INDEPENDENT_AMBULATORY_CARE_PROVIDER_SITE_OTHER): Payer: Medicare PPO | Admitting: Pharmacist Clinician (PhC)/ Clinical Pharmacy Specialist

## 2015-08-14 ENCOUNTER — Ambulatory Visit (INDEPENDENT_AMBULATORY_CARE_PROVIDER_SITE_OTHER): Payer: Medicare PPO | Admitting: Internal Medicine

## 2015-08-14 VITALS — BP 136/78 | HR 86 | Ht 63.0 in | Wt 114.6 lb

## 2015-08-14 DIAGNOSIS — I4891 Unspecified atrial fibrillation: Secondary | ICD-10-CM | POA: Diagnosis not present

## 2015-08-14 DIAGNOSIS — J449 Chronic obstructive pulmonary disease, unspecified: Secondary | ICD-10-CM

## 2015-08-14 DIAGNOSIS — I480 Paroxysmal atrial fibrillation: Secondary | ICD-10-CM | POA: Diagnosis not present

## 2015-08-14 DIAGNOSIS — R0609 Other forms of dyspnea: Secondary | ICD-10-CM | POA: Diagnosis not present

## 2015-08-14 DIAGNOSIS — Z7901 Long term (current) use of anticoagulants: Secondary | ICD-10-CM

## 2015-08-14 LAB — POCT INR: INR: 1.7

## 2015-08-14 NOTE — Progress Notes (Signed)
08/14/2015   Heather [name]   [birth year]  [id number]  Primary Physicia [name],Heather A, MD Primary Cardiologist: Dr Heather [name]  HPI:  Pleasant 82 y/o followed by Dr Heather [name] with a history of PAF. She recently had a MET test that indicated Emphysema although she has no history of smoking. She is on chronic Coumadin. Her B/P has been controlled by her readings at home and our readings here by our pharmacist. Echo in Feb 2013 showed an EF of >55%, NL LA size, and NL RVF. She had an episode of weakness and palpitations 11/13/12 pm. EMS was contacted and she was taken to [facility]. She received Diltiazem en route and was in NSR when she arrived at [facility]. They suggested she increase her Diltiazem to 240 mg daily but she was hesitant to do this.  Heather [name] returns today for followup. She is extubated doing remarkably well. Her shortness of breath has stabilized and she has not been admitted recently for worsening COPD or pulmonary problems. She is maintaining a sinus rhythm in the 80s without any evidence of recurrent A. fib. She is on warfarin with therapeutic INRs.  She does have some dependent rubor in both of her legs. I was able to palpate a decent dorsalis pedis pulse on the right foot but not on the left. She may have small vessel disease of the toes. It would be reasonable to consider arterial Dopplers.  [name]. She recently has been having problems with gallstones and underwent a cholecystectomy. Postoperatively she developed pain and has been on narcotics. She has not had a bowel movement in about 1-1/2 weeks. She reports significant pain and distention of the abdomen which is firm. She did hold her warfarin for the procedure and has been restarted. Her INR is subtherapeutic today and will require further adjustment.  08/14/2015  Heather [name] returns today for follow-up. She denies any worsening shortness of breath in fact she feels like her breathing is at baseline. Most events  related to COPD. Blood pressure is well-controlled today. Her INR slightly subtherapeutic at 1.7. Adjustments were made to warfarin. We did discuss alternatives to warfarin including the novel oral anticoagulants. She reported due to cost issues she feels more comfortable staying on warfarin. In general her INR seem to be fairly well-controlled although she does have some periods of subtherapeutic values.  Current Outpatient Prescriptions  Medication Sig Dispense Refill  . albuterol (PROVENTIL HFA;VENTOLIN HFA) 108 (90 BASE) MCG/ACT inhaler Inhale 2 puffs into the lungs every 6 (six) hours as needed for wheezing or shortness of breath. 1 Inhaler 2  . amitriptyline (ELAVIL) 25 MG tablet Take 25 mg by mouth at bedtime.    . Budesonide-Formoterol Fumarate (SYMBICORT IN) Inhale into the lungs 2 (two) times daily.    . calcium citrate-vitamin D (CITRACAL+D) 315-200 MG-UNIT per tablet Take 1 tablet by mouth daily.    Marland Kitchen diltiazem (CARDIZEM CD) 180 MG 24 hr capsule Take 180 mg by mouth daily.    Marland Kitchen docusate sodium (COLACE) 100 MG capsule Take 200 mg by mouth daily as needed for mild constipation.    . hydrochlorothiazide (HYDRODIURIL) 25 MG tablet TAKE ONE TABLET BY MOUTH ONCE DAILY (Patient taking differently: TAKE ONE TABLET BY MOUTH on Monday, Wednesday and Friday) 90 tablet 2  . levothyroxine (SYNTHROID, LEVOTHROID) 137 MCG tablet Take 137 mcg by mouth daily before breakfast.     . losartan (COZAAR) 100 MG tablet Take 1 tablet (100 mg total) by  mouth daily. KEEP OV. 30 tablet 0  . Multiple Vitamins-Minerals (MULTIVITAMIN WITH MINERALS) tablet Take 1 tablet by mouth daily.    . naphazoline-glycerin (CLEAR EYES) 0.012-0.2 % SOLN Place 1-2 drops into both eyes daily.     . polyethylene glycol (MIRALAX / GLYCOLAX) packet Take 17 g by mouth daily as needed for moderate constipation or severe constipation.    Marland Kitchen warfarin (COUMADIN) 2.5 MG tablet TAKE ONE TO ONE & ONE-HALF TABLETS BY MOUTH ONCE DAILY AS DIRECTED  40 tablet 5   No current facility-administered medications for this visit.    Allergies  Allergen Reactions  . Ace Inhibitors Cough  . Sulfur Nausea And Vomiting    Social History   Social History  . Marital Status: Married    Spouse Name: N/A  . Number of Children: 2  . Years of Education: N/A   Occupational History  . Retired Astronomer    Social History Main Topics  . Smoking status: Former Smoker -- 1.00 packs/day for 20 years    Types: Cigarettes    Quit date: 09/11/1988  . Smokeless tobacco: Never Used  . Alcohol Use: 0.0 oz/week    0 Standard drinks or equivalent per week     Comment: occ  . Drug Use: No  . Sexual Activity: Yes   Other Topics Concern  . Not on file   Social History Narrative     Review of Systems: Comprehensive review of systems was negative except as per history of present illness.   Blood pressure 136/78, pulse 86, height '5\' 3"'  (1.6 m), weight 114 lb 9.6 oz (51.982 kg).  General: Awake, oriented, in no distress HEENT: PERRLA, EOMI Neck: No JVP, no bruits Heart: Regular rate rhythm, occasional ectopy Lungs: Decreased breath sounds bilaterally, no rales or rhonchi or wheezes Abdomen: Soft, nontender, bowel sounds present Extremities: No edema Neurologic: Grossly intact Psych: Normal mood and affect  EKG  Sinus rhythm at 86  ASSESSMENT AND PLAN:   Patient Active Problem List   Diagnosis Date Noted  . Post herpetic neuralgia 06/06/2014  . Shingles outbreak 04/19/2014  . Gallstones 09/20/2013  . Community acquired pneumonia 05/17/2013  . COPD (chronic obstructive pulmonary disease) ([place]) 05/17/2013  . Dyspnea on exertion 04/25/2013  . Pulmonary emphysema ([place]) 11/15/2012  . HTN (hypertension) 11/15/2012  . PAF (paroxysmal atrial fibrillation) ([place]) 06/14/2012  . Long term current use of anticoagulant therapy 06/14/2012  . Ventral hernia 07/31/2011  . Abdominal wall mass 07/01/2011   PLAN: 1.  Ms. Heather [name] Seems to be stable  with her shortness of breath. She's not had any recurrent A. fib. Her INR slightly subtherapeutic today and will need to be adjusted. She prefers to stay on warfarin but is applying for supplemental drug plan. She will check with her insurance company about the cost of [place]. She can follow-up with me annually or sooner as necessary.  Pixie Casino, MD, [facility] Attending Cardiologist [place] 08/14/2015 5:42 PM

## 2015-08-14 NOTE — Patient Instructions (Signed)
Your physician wants you to follow-up in: 1 year with Dr. Debara Pickett. You will receive a reminder letter in the mail two months in advance. If you don't receive a letter, please call our office to schedule the follow-up appointment.  Ask insurance if [facility] are covered -- if you would like change from warfarin, this can be arranged with either Dr. Debara Pickett or Erasmo Downer

## 2015-08-18 ENCOUNTER — Other Ambulatory Visit: Payer: Self-pay | Admitting: Internal Medicine

## 2015-08-20 NOTE — Telephone Encounter (Signed)
Rx(s) sent to pharmacy electronically.  

## 2015-08-31 ENCOUNTER — Ambulatory Visit (INDEPENDENT_AMBULATORY_CARE_PROVIDER_SITE_OTHER): Payer: Medicare PPO | Admitting: Pharmacist Clinician (PhC)/ Clinical Pharmacy Specialist

## 2015-08-31 DIAGNOSIS — Z7901 Long term (current) use of anticoagulants: Secondary | ICD-10-CM

## 2015-08-31 DIAGNOSIS — I480 Paroxysmal atrial fibrillation: Secondary | ICD-10-CM | POA: Diagnosis not present

## 2015-08-31 DIAGNOSIS — I4891 Unspecified atrial fibrillation: Secondary | ICD-10-CM

## 2015-08-31 LAB — POCT INR: INR: 2.1

## 2015-09-03 ENCOUNTER — Other Ambulatory Visit: Payer: Self-pay | Admitting: Internal Medicine

## 2015-10-01 ENCOUNTER — Ambulatory Visit (INDEPENDENT_AMBULATORY_CARE_PROVIDER_SITE_OTHER): Payer: Medicare PPO | Admitting: Pharmacist Clinician (PhC)/ Clinical Pharmacy Specialist

## 2015-10-01 DIAGNOSIS — Z7901 Long term (current) use of anticoagulants: Secondary | ICD-10-CM

## 2015-10-01 DIAGNOSIS — I480 Paroxysmal atrial fibrillation: Secondary | ICD-10-CM | POA: Diagnosis not present

## 2015-10-01 DIAGNOSIS — I4891 Unspecified atrial fibrillation: Secondary | ICD-10-CM | POA: Diagnosis not present

## 2015-10-01 LAB — POCT INR: INR: 1.8

## 2015-10-22 ENCOUNTER — Ambulatory Visit (INDEPENDENT_AMBULATORY_CARE_PROVIDER_SITE_OTHER): Payer: Medicare PPO | Admitting: Pharmacist

## 2015-10-22 DIAGNOSIS — I480 Paroxysmal atrial fibrillation: Secondary | ICD-10-CM | POA: Diagnosis not present

## 2015-10-22 DIAGNOSIS — I4891 Unspecified atrial fibrillation: Secondary | ICD-10-CM

## 2015-10-22 DIAGNOSIS — Z7901 Long term (current) use of anticoagulants: Secondary | ICD-10-CM | POA: Diagnosis not present

## 2015-10-22 LAB — POCT INR: INR: 2.4

## 2015-11-22 ENCOUNTER — Ambulatory Visit (INDEPENDENT_AMBULATORY_CARE_PROVIDER_SITE_OTHER): Payer: Medicare PPO | Admitting: Pharmacist

## 2015-11-22 DIAGNOSIS — Z7901 Long term (current) use of anticoagulants: Secondary | ICD-10-CM

## 2015-11-22 DIAGNOSIS — I480 Paroxysmal atrial fibrillation: Secondary | ICD-10-CM | POA: Diagnosis not present

## 2015-11-22 DIAGNOSIS — I4891 Unspecified atrial fibrillation: Secondary | ICD-10-CM | POA: Diagnosis not present

## 2015-11-22 LAB — POCT INR: INR: 3.7

## 2015-12-14 ENCOUNTER — Ambulatory Visit (INDEPENDENT_AMBULATORY_CARE_PROVIDER_SITE_OTHER): Payer: Medicare PPO | Admitting: Pharmacist Clinician (PhC)/ Clinical Pharmacy Specialist

## 2015-12-14 DIAGNOSIS — Z7901 Long term (current) use of anticoagulants: Secondary | ICD-10-CM

## 2015-12-14 DIAGNOSIS — I480 Paroxysmal atrial fibrillation: Secondary | ICD-10-CM

## 2015-12-14 DIAGNOSIS — I4891 Unspecified atrial fibrillation: Secondary | ICD-10-CM

## 2015-12-14 LAB — POCT INR: INR: 3.3

## 2015-12-29 ENCOUNTER — Other Ambulatory Visit: Payer: Self-pay | Admitting: Internal Medicine

## 2016-01-02 ENCOUNTER — Ambulatory Visit (INDEPENDENT_AMBULATORY_CARE_PROVIDER_SITE_OTHER): Payer: Medicare PPO | Admitting: Pharmacist

## 2016-01-02 DIAGNOSIS — Z7901 Long term (current) use of anticoagulants: Secondary | ICD-10-CM

## 2016-01-02 DIAGNOSIS — I4891 Unspecified atrial fibrillation: Secondary | ICD-10-CM

## 2016-01-02 DIAGNOSIS — I480 Paroxysmal atrial fibrillation: Secondary | ICD-10-CM

## 2016-01-02 LAB — POCT INR: INR: 2.3

## 2016-01-23 ENCOUNTER — Ambulatory Visit (INDEPENDENT_AMBULATORY_CARE_PROVIDER_SITE_OTHER): Payer: Medicare PPO | Admitting: Pharmacist

## 2016-01-23 DIAGNOSIS — Z7901 Long term (current) use of anticoagulants: Secondary | ICD-10-CM | POA: Diagnosis not present

## 2016-01-23 DIAGNOSIS — I4891 Unspecified atrial fibrillation: Secondary | ICD-10-CM

## 2016-01-23 DIAGNOSIS — I480 Paroxysmal atrial fibrillation: Secondary | ICD-10-CM | POA: Diagnosis not present

## 2016-01-23 LAB — POCT INR: INR: 1.7

## 2016-02-13 ENCOUNTER — Ambulatory Visit (INDEPENDENT_AMBULATORY_CARE_PROVIDER_SITE_OTHER): Payer: Medicare PPO | Admitting: Pharmacist

## 2016-02-13 DIAGNOSIS — I4891 Unspecified atrial fibrillation: Secondary | ICD-10-CM

## 2016-02-13 DIAGNOSIS — Z7901 Long term (current) use of anticoagulants: Secondary | ICD-10-CM

## 2016-02-13 DIAGNOSIS — I480 Paroxysmal atrial fibrillation: Secondary | ICD-10-CM | POA: Diagnosis not present

## 2016-02-13 LAB — POCT INR: INR: 2.8

## 2016-03-12 ENCOUNTER — Ambulatory Visit (INDEPENDENT_AMBULATORY_CARE_PROVIDER_SITE_OTHER): Payer: Medicare PPO | Admitting: Pharmacist

## 2016-03-12 DIAGNOSIS — I4891 Unspecified atrial fibrillation: Secondary | ICD-10-CM | POA: Diagnosis not present

## 2016-03-12 DIAGNOSIS — I480 Paroxysmal atrial fibrillation: Secondary | ICD-10-CM

## 2016-03-12 DIAGNOSIS — Z7901 Long term (current) use of anticoagulants: Secondary | ICD-10-CM

## 2016-03-12 LAB — POCT INR: INR: 2.5

## 2016-04-07 ENCOUNTER — Other Ambulatory Visit: Payer: Self-pay

## 2016-04-07 MED ORDER — LOSARTAN POTASSIUM 100 MG PO TABS: ORAL_TABLET | ORAL | 3 refills | Status: DC

## 2016-04-07 MED ORDER — HYDROCHLOROTHIAZIDE 25 MG PO TABS: 25.0000 mg | ORAL_TABLET | Freq: Every day | ORAL | 3 refills | Status: DC

## 2016-04-10 ENCOUNTER — Ambulatory Visit (INDEPENDENT_AMBULATORY_CARE_PROVIDER_SITE_OTHER): Payer: Medicare PPO | Admitting: Pharmacist Clinician (PhC)/ Clinical Pharmacy Specialist

## 2016-04-10 DIAGNOSIS — Z7901 Long term (current) use of anticoagulants: Secondary | ICD-10-CM | POA: Diagnosis not present

## 2016-04-10 DIAGNOSIS — I4891 Unspecified atrial fibrillation: Secondary | ICD-10-CM

## 2016-04-10 DIAGNOSIS — I480 Paroxysmal atrial fibrillation: Secondary | ICD-10-CM | POA: Diagnosis not present

## 2016-04-10 LAB — POCT INR: INR: 2.2

## 2016-04-11 ENCOUNTER — Other Ambulatory Visit: Payer: Self-pay | Admitting: Pharmacist

## 2016-04-11 MED ORDER — HYDROCHLOROTHIAZIDE 25 MG PO TABS: 25.0000 mg | ORAL_TABLET | Freq: Every day | ORAL | 3 refills | Status: DC

## 2016-04-11 MED ORDER — LOSARTAN POTASSIUM 100 MG PO TABS: ORAL_TABLET | ORAL | 3 refills | Status: DC

## 2016-04-11 MED ORDER — WARFARIN SODIUM 2.5 MG PO TABS: ORAL_TABLET | ORAL | 0 refills | Status: DC

## 2016-04-14 DIAGNOSIS — R490 Dysphonia: Secondary | ICD-10-CM | POA: Diagnosis not present

## 2016-04-14 DIAGNOSIS — J310 Chronic rhinitis: Secondary | ICD-10-CM | POA: Diagnosis not present

## 2016-04-14 DIAGNOSIS — H903 Sensorineural hearing loss, bilateral: Secondary | ICD-10-CM | POA: Diagnosis not present

## 2016-04-14 DIAGNOSIS — H6123 Impacted cerumen, bilateral: Secondary | ICD-10-CM | POA: Diagnosis not present

## 2016-04-16 ENCOUNTER — Inpatient Hospital Stay (HOSPITAL_COMMUNITY)
Admission: EM | Admit: 2016-04-16 | Discharge: 2016-04-19 | DRG: 871 | Disposition: A | Discharge status: Home / Self Care | Payer: Medicare PPO | Attending: Internal Medicine | Admitting: Internal Medicine

## 2016-04-16 ENCOUNTER — Emergency Department (HOSPITAL_COMMUNITY): Payer: Medicare PPO

## 2016-04-16 ENCOUNTER — Encounter (HOSPITAL_COMMUNITY): Payer: Self-pay

## 2016-04-16 DIAGNOSIS — J449 Chronic obstructive pulmonary disease, unspecified: Secondary | ICD-10-CM | POA: Diagnosis not present

## 2016-04-16 DIAGNOSIS — R0602 Shortness of breath: Secondary | ICD-10-CM | POA: Diagnosis not present

## 2016-04-16 DIAGNOSIS — K909 Intestinal malabsorption, unspecified: Secondary | ICD-10-CM | POA: Diagnosis present

## 2016-04-16 DIAGNOSIS — Z87891 Personal history of nicotine dependence: Secondary | ICD-10-CM

## 2016-04-16 DIAGNOSIS — K219 Gastro-esophageal reflux disease without esophagitis: Secondary | ICD-10-CM | POA: Diagnosis present

## 2016-04-16 DIAGNOSIS — Z888 Allergy status to other drugs, medicaments and biological substances status: Secondary | ICD-10-CM

## 2016-04-16 DIAGNOSIS — Z961 Presence of intraocular lens: Secondary | ICD-10-CM | POA: Diagnosis present

## 2016-04-16 DIAGNOSIS — A419 Sepsis, unspecified organism: Secondary | ICD-10-CM

## 2016-04-16 DIAGNOSIS — F419 Anxiety disorder, unspecified: Secondary | ICD-10-CM | POA: Diagnosis present

## 2016-04-16 DIAGNOSIS — I480 Paroxysmal atrial fibrillation: Secondary | ICD-10-CM | POA: Diagnosis not present

## 2016-04-16 DIAGNOSIS — J101 Influenza due to other identified influenza virus with other respiratory manifestations: Secondary | ICD-10-CM | POA: Diagnosis not present

## 2016-04-16 DIAGNOSIS — I4891 Unspecified atrial fibrillation: Secondary | ICD-10-CM | POA: Diagnosis present

## 2016-04-16 DIAGNOSIS — I10 Essential (primary) hypertension: Secondary | ICD-10-CM | POA: Diagnosis not present

## 2016-04-16 DIAGNOSIS — G629 Polyneuropathy, unspecified: Secondary | ICD-10-CM | POA: Diagnosis not present

## 2016-04-16 DIAGNOSIS — Z7901 Long term (current) use of anticoagulants: Secondary | ICD-10-CM

## 2016-04-16 DIAGNOSIS — Z9842 Cataract extraction status, left eye: Secondary | ICD-10-CM

## 2016-04-16 DIAGNOSIS — J441 Chronic obstructive pulmonary disease with (acute) exacerbation: Secondary | ICD-10-CM | POA: Diagnosis present

## 2016-04-16 DIAGNOSIS — R0902 Hypoxemia: Secondary | ICD-10-CM | POA: Diagnosis not present

## 2016-04-16 DIAGNOSIS — R000 Tachycardia, unspecified: Secondary | ICD-10-CM | POA: Diagnosis present

## 2016-04-16 DIAGNOSIS — J111 Influenza due to unidentified influenza virus with other respiratory manifestations: Secondary | ICD-10-CM

## 2016-04-16 DIAGNOSIS — E039 Hypothyroidism, unspecified: Secondary | ICD-10-CM | POA: Diagnosis not present

## 2016-04-16 DIAGNOSIS — M810 Age-related osteoporosis without current pathological fracture: Secondary | ICD-10-CM | POA: Diagnosis present

## 2016-04-16 DIAGNOSIS — Z96642 Presence of left artificial hip joint: Secondary | ICD-10-CM | POA: Diagnosis present

## 2016-04-16 DIAGNOSIS — Z823 Family history of stroke: Secondary | ICD-10-CM

## 2016-04-16 DIAGNOSIS — E876 Hypokalemia: Secondary | ICD-10-CM | POA: Diagnosis not present

## 2016-04-16 DIAGNOSIS — M1990 Unspecified osteoarthritis, unspecified site: Secondary | ICD-10-CM | POA: Diagnosis present

## 2016-04-16 DIAGNOSIS — J9601 Acute respiratory failure with hypoxia: Secondary | ICD-10-CM | POA: Diagnosis present

## 2016-04-16 DIAGNOSIS — Z833 Family history of diabetes mellitus: Secondary | ICD-10-CM | POA: Diagnosis not present

## 2016-04-16 DIAGNOSIS — Z882 Allergy status to sulfonamides status: Secondary | ICD-10-CM

## 2016-04-16 DIAGNOSIS — Z8052 Family history of malignant neoplasm of bladder: Secondary | ICD-10-CM

## 2016-04-16 DIAGNOSIS — E038 Other specified hypothyroidism: Secondary | ICD-10-CM

## 2016-04-16 DIAGNOSIS — Z9841 Cataract extraction status, right eye: Secondary | ICD-10-CM | POA: Diagnosis not present

## 2016-04-16 DIAGNOSIS — R069 Unspecified abnormalities of breathing: Secondary | ICD-10-CM | POA: Diagnosis not present

## 2016-04-16 DIAGNOSIS — R69 Illness, unspecified: Secondary | ICD-10-CM

## 2016-04-16 DIAGNOSIS — I351 Nonrheumatic aortic (valve) insufficiency: Secondary | ICD-10-CM | POA: Diagnosis present

## 2016-04-16 LAB — CBC WITH DIFFERENTIAL/PLATELET
Basophils Absolute: 0 10*3/uL (ref 0.0–0.1)
Basophils Relative: 0 %
EOS ABS: 0 10*3/uL (ref 0.0–0.7)
EOS PCT: 0 %
HCT: 42.2 % (ref 36.0–46.0)
Hemoglobin: 14.6 g/dL (ref 12.0–15.0)
LYMPHS ABS: 1.2 10*3/uL (ref 0.7–4.0)
Lymphocytes Relative: 12 %
MCH: 33 pg (ref 26.0–34.0)
MCHC: 34.6 g/dL (ref 30.0–36.0)
MCV: 95.5 fL (ref 78.0–100.0)
MONOS PCT: 6 %
Monocytes Absolute: 0.6 10*3/uL (ref 0.1–1.0)
Neutro Abs: 8.3 10*3/uL — ABNORMAL HIGH (ref 1.7–7.7)
Neutrophils Relative %: 82 %
PLATELETS: 248 10*3/uL (ref 150–400)
RBC: 4.42 MIL/uL (ref 3.87–5.11)
RDW: 13.8 % (ref 11.5–15.5)
WBC: 10.2 10*3/uL (ref 4.0–10.5)

## 2016-04-16 LAB — COMPREHENSIVE METABOLIC PANEL
ALBUMIN: 4.5 g/dL (ref 3.5–5.0)
ALT: 23 U/L (ref 14–54)
AST: 30 U/L (ref 15–41)
Alkaline Phosphatase: 75 U/L (ref 38–126)
Anion gap: 12 (ref 5–15)
BUN: 24 mg/dL — AB (ref 6–20)
CHLORIDE: 103 mmol/L (ref 101–111)
CO2: 24 mmol/L (ref 22–32)
CREATININE: 0.6 mg/dL (ref 0.44–1.00)
Calcium: 9.2 mg/dL (ref 8.9–10.3)
GFR calc Af Amer: >60 mL/min (ref >60)
GFR calc non Af Amer: >60 mL/min (ref >60)
Glucose, Bld: 156 mg/dL — ABNORMAL HIGH (ref 65–99)
Potassium: 3.2 mmol/L — ABNORMAL LOW (ref 3.5–5.1)
SODIUM: 139 mmol/L (ref 135–145)
Total Bilirubin: 1.3 mg/dL — ABNORMAL HIGH (ref 0.3–1.2)
Total Protein: 7.1 g/dL (ref 6.5–8.1)

## 2016-04-16 LAB — INFLUENZA PANEL BY PCR (TYPE A & B)
INFLAPCR: POSITIVE — AB
Influenza B By PCR: NEGATIVE

## 2016-04-16 LAB — PROTIME-INR
INR: 2.1
Prothrombin Time: 23.9 seconds — ABNORMAL HIGH (ref 11.4–15.2)

## 2016-04-16 LAB — I-STAT CG4 LACTIC ACID, ED: Lactic Acid, Venous: 2.69 mmol/L (ref 0.5–1.9)

## 2016-04-16 LAB — LACTIC ACID, PLASMA: Lactic Acid, Venous: 2.1 mmol/L (ref 0.5–1.9)

## 2016-04-16 LAB — PROCALCITONIN: PROCALCITONIN: 0.28 ng/mL

## 2016-04-16 LAB — TROPONIN I: Troponin I: <0.03 ng/mL (ref <0.03)

## 2016-04-16 MED ORDER — DEXTROSE 5 % IV SOLN
1.0000 g | Freq: Once | INTRAVENOUS | Status: AC
  Administered 2016-04-16: 1 g via INTRAVENOUS
  Filled 2016-04-16: qty 10

## 2016-04-16 MED ORDER — ACETAMINOPHEN 325 MG PO TABS: 650.0000 mg | ORAL_TABLET | Freq: Four times a day (QID) | ORAL | Status: DC | PRN

## 2016-04-16 MED ORDER — BENZONATATE 100 MG PO CAPS
200.0000 mg | ORAL_CAPSULE | Freq: Two times a day (BID) | ORAL | Status: DC | PRN
  Administered 2016-04-17 – 2016-04-19 (×5): 200 mg via ORAL
  Filled 2016-04-16 (×5): qty 2

## 2016-04-16 MED ORDER — ACETAMINOPHEN 650 MG RE SUPP: 650.0000 mg | Freq: Four times a day (QID) | RECTAL | Status: DC | PRN

## 2016-04-16 MED ORDER — ALBUTEROL SULFATE (2.5 MG/3ML) 0.083% IN NEBU: 2.5000 mg | INHALATION_SOLUTION | Freq: Four times a day (QID) | RESPIRATORY_TRACT | Status: DC

## 2016-04-16 MED ORDER — WARFARIN SODIUM 2.5 MG PO TABS
2.5000 mg | ORAL_TABLET | ORAL | Status: DC
Start: 2016-04-17 — End: 2016-04-18
  Administered 2016-04-17: 2.5 mg via ORAL
  Filled 2016-04-16: qty 1

## 2016-04-16 MED ORDER — ALBUTEROL SULFATE (2.5 MG/3ML) 0.083% IN NEBU: 2.5000 mg | INHALATION_SOLUTION | RESPIRATORY_TRACT | Status: DC | PRN

## 2016-04-16 MED ORDER — WARFARIN - PHARMACIST DOSING INPATIENT: Freq: Every day | Status: DC

## 2016-04-16 MED ORDER — ONDANSETRON HCL 4 MG/2ML IJ SOLN
4.0000 mg | Freq: Once | INTRAMUSCULAR | Status: AC
  Administered 2016-04-16: 4 mg via INTRAVENOUS
  Filled 2016-04-16: qty 2

## 2016-04-16 MED ORDER — ONDANSETRON HCL 4 MG/2ML IJ SOLN: 4.0000 mg | Freq: Four times a day (QID) | INTRAMUSCULAR | Status: DC | PRN

## 2016-04-16 MED ORDER — SODIUM CHLORIDE 0.9 % IV SOLN
INTRAVENOUS | Status: DC
  Administered 2016-04-17: 01:00:00 via INTRAVENOUS

## 2016-04-16 MED ORDER — DEXTROSE 5 % IV SOLN: 1.0000 g | INTRAVENOUS | Status: DC

## 2016-04-16 MED ORDER — GUAIFENESIN ER 600 MG PO TB12
600.0000 mg | ORAL_TABLET | Freq: Two times a day (BID) | ORAL | Status: DC
  Administered 2016-04-17 – 2016-04-19 (×6): 600 mg via ORAL
  Filled 2016-04-16 (×6): qty 1

## 2016-04-16 MED ORDER — DILTIAZEM HCL ER COATED BEADS 180 MG PO CP24
180.0000 mg | ORAL_CAPSULE | Freq: Every day | ORAL | Status: DC
  Administered 2016-04-17 – 2016-04-19 (×3): 180 mg via ORAL
  Filled 2016-04-16 (×3): qty 1

## 2016-04-16 MED ORDER — POTASSIUM CHLORIDE CRYS ER 20 MEQ PO TBCR
40.0000 meq | EXTENDED_RELEASE_TABLET | Freq: Once | ORAL | Status: AC
  Administered 2016-04-16: 40 meq via ORAL
  Filled 2016-04-16: qty 2

## 2016-04-16 MED ORDER — WARFARIN SODIUM 2.5 MG PO TABS: 3.7500 mg | ORAL_TABLET | ORAL | Status: DC

## 2016-04-16 MED ORDER — SODIUM CHLORIDE 0.9 % IV BOLUS (SEPSIS)
250.0000 mL | Freq: Once | INTRAVENOUS | Status: AC
  Administered 2016-04-16: 250 mL via INTRAVENOUS

## 2016-04-16 MED ORDER — AZITHROMYCIN 500 MG IV SOLR: 500.0000 mg | INTRAVENOUS | Status: DC

## 2016-04-16 MED ORDER — DEXTROSE 5 % IV SOLN
500.0000 mg | Freq: Once | INTRAVENOUS | Status: AC
  Administered 2016-04-16: 500 mg via INTRAVENOUS
  Filled 2016-04-16: qty 500

## 2016-04-16 MED ORDER — ARFORMOTEROL TARTRATE 15 MCG/2ML IN NEBU
15.0000 ug | INHALATION_SOLUTION | Freq: Two times a day (BID) | RESPIRATORY_TRACT | Status: DC
  Administered 2016-04-17 – 2016-04-19 (×6): 15 ug via RESPIRATORY_TRACT
  Filled 2016-04-16 (×6): qty 2

## 2016-04-16 MED ORDER — NAPHAZOLINE-GLYCERIN 0.012-0.2 % OP SOLN
1.0000 [drp] | Freq: Every day | OPHTHALMIC | Status: DC
  Administered 2016-04-17 – 2016-04-19 (×3): 2 [drp] via OPHTHALMIC
  Filled 2016-04-16: qty 15

## 2016-04-16 MED ORDER — LOSARTAN POTASSIUM 50 MG PO TABS
100.0000 mg | ORAL_TABLET | Freq: Every day | ORAL | Status: DC
  Administered 2016-04-17 – 2016-04-19 (×3): 100 mg via ORAL
  Filled 2016-04-16 (×4): qty 2

## 2016-04-16 MED ORDER — SODIUM CHLORIDE 0.9 % IV BOLUS (SEPSIS)
1000.0000 mL | Freq: Once | INTRAVENOUS | Status: AC
  Administered 2016-04-16: 1000 mL via INTRAVENOUS

## 2016-04-16 MED ORDER — BUDESONIDE 0.5 MG/2ML IN SUSP
0.5000 mg | Freq: Two times a day (BID) | RESPIRATORY_TRACT | Status: DC
  Administered 2016-04-17 – 2016-04-19 (×6): 0.5 mg via RESPIRATORY_TRACT
  Filled 2016-04-16 (×6): qty 2

## 2016-04-16 MED ORDER — SODIUM CHLORIDE 0.9 % IV BOLUS (SEPSIS)
500.0000 mL | Freq: Once | INTRAVENOUS | Status: AC
  Administered 2016-04-16: 500 mL via INTRAVENOUS

## 2016-04-16 MED ORDER — OSELTAMIVIR PHOSPHATE 30 MG PO CAPS
30.0000 mg | ORAL_CAPSULE | Freq: Once | ORAL | Status: AC
  Administered 2016-04-16: 30 mg via ORAL
  Filled 2016-04-16: qty 1

## 2016-04-16 MED ORDER — ONDANSETRON HCL 4 MG PO TABS: 4.0000 mg | ORAL_TABLET | Freq: Four times a day (QID) | ORAL | Status: DC | PRN

## 2016-04-16 MED ORDER — AMITRIPTYLINE HCL 25 MG PO TABS
25.0000 mg | ORAL_TABLET | Freq: Every day | ORAL | Status: DC
  Filled 2016-04-16 (×2): qty 1

## 2016-04-16 MED ORDER — LEVOTHYROXINE SODIUM 137 MCG PO TABS: 137.0000 ug | ORAL_TABLET | Freq: Every day | ORAL | Status: DC

## 2016-04-16 MED ORDER — LEVOTHYROXINE SODIUM 25 MCG PO TABS
137.0000 ug | ORAL_TABLET | Freq: Every day | ORAL | Status: DC
  Administered 2016-04-17 – 2016-04-19 (×3): 137 ug via ORAL
  Filled 2016-04-16 (×3): qty 1

## 2016-04-16 MED ORDER — IPRATROPIUM BROMIDE 0.02 % IN SOLN: 0.5000 mg | Freq: Four times a day (QID) | RESPIRATORY_TRACT | Status: DC

## 2016-04-16 NOTE — ED Notes (Signed)
Second set of blood cultures obtained

## 2016-04-16 NOTE — H&P (Signed)
History and Physical    DAMARI HILTZ SNK:[id number] DOB: 26-Nov-1926 DOA: 04/16/2016  Referring MD/NP/PA: Dr. Billy Fischer PCP: Geoffery Lyons, MD  Patient coming from: home  Chief Complaint: Shortness of breath  HPI: Heather [name] is a 83 y.o. female with medical history significant of COPD, afib on anticoagulation, hypothyroidism; who presents with 2 days of productive cough and shortness of breath. At baseline she is not on oxygen at home. She reports developing a productive cough with yellowish sputum. Associated symptoms include wheezing, nausea, fever up to 101.103F at home, malaise, and sore throat. Denies any chest pain, diarrhea, vomiting,  loss of consciousness, recent medication changes, or hospitalizations. Tried using her home inhalers of Symbicort and albuterol with some mild relief in respiratory symptoms. Otherwise denies using anything else to treat symptoms. Patient husband was just recently admitted for pneumonia.   ED Course: Upon admission patient was seen to be afebrile, pulse up to 137, respirations up to 24, and O2 saturations as low as 88% on RA.  O2 saturations improved to 98% on on 2 L of nasal cannula oxygen. Lab work showed WBC 10.2, BUN 24, creatinine 0.6, total bilirubin 1.3, INR 2.1, and lactic acid 2.69.  Initial chest x-ray showed emphysematous changes, but no acute infiltrate. Sepsis protocol initiated and patient was given approximately 750 mL fluid bolus then started on ceftriaxone and azithromycin and given 1 dose of Tamiflu.  Review of Systems: As per HPI otherwise 10 point review of systems negative.   Past Medical History:  Diagnosis Date  . Anxiety   . Arthritis   . Atrial fibrillation ([place])   . COPD (chronic obstructive pulmonary disease) ([place])   . Dysrhythmia    AF  . Essential hypertension, benign   . Gallstones   . GERD (gastroesophageal reflux disease)   . H/O hiatal hernia   . History of nuclear stress test 10/2010   dipyridamole;  normal pattern of perfusion; low risk, normal study  . 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 Mild aortic insufficiency   . Osteoporosis   . Peripheral neuropathy ([place])   . Pneumonia    states she had it twice, last time a couple of months ago  . PONV (postoperative nausea and vomiting)   . Shortness of breath    with exertion  . Unspecified hypothyroidism   . Venous insufficiency     Past Surgical History:  Procedure Laterality Date  . APPENDECTOMY  1935  . BACK SURGERY     x2  . Cardiometablic Testing  9/37/9024   submaximal effort with peak RER of 0.5, peak VO2 79% predicted; HR peak up to 78%; PVC was 55% predicted, PEV1 39% predicted; PEV1/VC ratio was reduced; normal vital capacity; DLCO was reduced to 63%  . CARPAL TUNNEL RELEASE    . CATARACT EXTRACTION W/ INTRAOCULAR LENS  IMPLANT, BILATERAL    . CHOLECYSTECTOMY  04/07/2014   dr toth  . CHOLECYSTECTOMY N/A 04/07/2014   Procedure: LAPAROSCOPIC CHOLECYSTECTOMY WITH INTRAOPERATIVE CHOLANGIOGRAM POSSBILE OPEN;  Surgeon: Autumn Messing III, MD;  Location: [facility];  Service: General;  Laterality: N/A;  . COLON SURGERY  2008   partial  . HERNIA REPAIR    . JOINT REPLACEMENT    . left hip relacement  2000  . TONSILLECTOMY  1935  . TOTAL ABDOMINAL HYSTERECTOMY  1970  . TRANSTHORACIC ECHOCARDIOGRAM  05/2011   EF=>55%; mild conc LVH; mild mitral annular calcif; mild TR; AV mildly sclerotic & mild AR  .  VENTRAL HERNIA REPAIR  09/19/2011   Procedure: LAPAROSCOPIC VENTRAL HERNIA;  Surgeon: Merrie Roof, MD;  Location: [facility];  Service: General;  Laterality: N/A;  laparoscopic ventral hernia repair with mesh     reports that she quit smoking about 27 years ago. Her smoking use included Cigarettes. She has a 20.00 pack-year smoking history. She has never used smokeless tobacco. She reports that she drinks alcohol. She reports that she does not use drugs.  Allergies  Allergen Reactions  . Ace Inhibitors  Cough  . Sulfur Nausea And Vomiting    Family History  Problem Relation Age of Onset  . Stroke Mother   . Stroke Father   . Heart block Brother   . Bladder Cancer Brother   . Diabetes Brother   . Stroke Brother     Prior to Admission medications   Medication Sig Start Date End Date Taking? Authorizing Provider  albuterol (PROVENTIL HFA;VENTOLIN HFA) 108 (90 BASE) MCG/ACT inhaler Inhale 2 puffs into the lungs every 6 (six) hours as needed for wheezing or shortness of breath. 05/23/13  Yes Burnard Bunting, MD  amitriptyline (ELAVIL) 25 MG tablet Take 25 mg by mouth at bedtime.   Yes Historical Provider, MD  Budesonide-Formoterol Fumarate (SYMBICORT IN) Inhale into the lungs 2 (two) times daily.   Yes Historical Provider, MD  calcium citrate-vitamin D (CITRACAL+D) 315-200 MG-UNIT per tablet Take 1 tablet by mouth daily.   Yes Historical Provider, MD  diltiazem (CARDIZEM CD) 180 MG 24 hr capsule Take 180 mg by mouth daily.   Yes Historical Provider, MD  hydrochlorothiazide (HYDRODIURIL) 25 MG tablet Take 1 tablet (25 mg total) by mouth daily. 04/11/16  Yes Pixie Casino, MD  levothyroxine (SYNTHROID, LEVOTHROID) 137 MCG tablet Take 137 mcg by mouth daily before breakfast.    Yes Historical Provider, MD  losartan (COZAAR) 100 MG tablet TAKE 1 TABLET BY MOUTH EVERY DAY KEEP OFFICE VISIT 04/11/16  Yes Pixie Casino, MD  Multiple Vitamins-Minerals (MULTIVITAMIN WITH MINERALS) tablet Take 1 tablet by mouth daily.   Yes Historical Provider, MD  naphazoline-glycerin (CLEAR EYES) 0.012-0.2 % SOLN Place 1-2 drops into both eyes daily.    Yes Historical Provider, MD  polyethylene glycol (MIRALAX / GLYCOLAX) packet Take 17 g by mouth daily as needed for moderate constipation or severe constipation.   Yes Historical Provider, MD  warfarin (COUMADIN) 2.5 MG tablet TAKE ONE TO ONE & ONE-HALF TABLETS BY MOUTH ONCE DAILY AS DIRECTED Patient taking differently: TAKE ONE TABLET(2.5 mg) ON Monday,WEDNESDAY,  Thursday, Friday, Saturday AND Sunday TAKE  ONE & ONE-HALF TABLET(3.75 mg) ON Tuesday  AS DIRECTED 04/11/16  Yes Pixie Casino, MD    Physical Exam:  Constitutional: Elderly female who appears acutely sick, but nontoxic and able to follow commands. Vitals:   04/16/16 1932 04/16/16 1933 04/16/16 2100 04/16/16 2129  BP: 122/60 141/74 124/59 (!) 115/53  Pulse: (!) 137 (!) 130 111 111  Resp: '20 24 24 20  '$ Temp: 98.6 F (37 C)     TempSrc: Oral     SpO2: 98% 99% 100% 98%   Eyes: PERRL, lids and conjunctivae normal ENMT: Mucous membranes are dry. Posterior pharynx clear of any exudate or lesions. Neck: normal, supple, no masses, no thyromegaly Respiratory: Tachypneic with mild expiratory wheezes appreciated. Patient able to talk and nearly complete sentences on 4 L of nasal cannula oxygen.  Cardiovascular: Tachycardic, no murmurs / rubs / gallops. No extremity edema. 2+ pedal pulses. No carotid bruits.  Abdomen: no tenderness, no masses palpated. No hepatosplenomegaly. Bowel sounds positive.  Musculoskeletal: no clubbing / cyanosis. No joint deformity upper and lower extremities. Good ROM, no contractures. Normal muscle tone.  Skin: no rashes, lesions, ulcers. No induration Neurologic: CN 2-12 grossly intact. Sensation intact, DTR normal. Strength 5/5 in all 4.  Psychiatric: Normal judgment and insight. Alert and oriented x 3. Normal mood.     Labs on Admission: I have personally reviewed following labs and imaging studies  CBC:  Recent Labs Lab 04/16/16 2019  WBC 10.2  NEUTROABS 8.3*  HGB 14.6  HCT 42.2  MCV 95.5  PLT 379   Basic Metabolic Panel:  Recent Labs Lab 04/16/16 2019  NA 139  K 3.2*  CL 103  CO2 24  GLUCOSE 156*  BUN 24*  CREATININE 0.60  CALCIUM 9.2   GFR: CrCl cannot be calculated (Unknown ideal weight.). Liver Function Tests:  Recent Labs Lab 04/16/16 2019  AST 30  ALT 23  ALKPHOS 75  BILITOT 1.3*  PROT 7.1  ALBUMIN 4.5   No results for  input(s): LIPASE, AMYLASE in the last 168 hours. No results for input(s): AMMONIA in the last 168 hours. Coagulation Profile:  Recent Labs Lab 04/10/16 1049 04/16/16 2018  INR 2.2 2.10   Cardiac Enzymes: No results for input(s): CKTOTAL, CKMB, CKMBINDEX, TROPONINI in the last 168 hours. BNP (last 3 results) No results for input(s): PROBNP in the last 8760 hours. HbA1C: No results for input(s): HGBA1C in the last 72 hours. CBG: No results for input(s): GLUCAP in the last 168 hours. Lipid Profile: No results for input(s): CHOL, HDL, LDLCALC, TRIG, CHOLHDL, LDLDIRECT in the last 72 hours. Thyroid Function Tests: No results for input(s): TSH, T4TOTAL, FREET4, T3FREE, THYROIDAB in the last 72 hours. Anemia Panel: No results for input(s): VITAMINB12, FOLATE, FERRITIN, TIBC, IRON, RETICCTPCT in the last 72 hours. Urine analysis:    Component Value Date/Time   COLORURINE YELLOW 03/09/2015 1500   APPEARANCEUR CLOUDY (A) 03/09/2015 1500   LABSPEC 1.011 03/09/2015 1500   PHURINE 7.5 03/09/2015 1500   GLUCOSEU NEGATIVE 03/09/2015 1500   HGBUR NEGATIVE 03/09/2015 1500   BILIRUBINUR NEGATIVE 03/09/2015 1500   KETONESUR NEGATIVE 03/09/2015 1500   PROTEINUR NEGATIVE 03/09/2015 1500   UROBILINOGEN 0.2 05/17/2013 1207   NITRITE POSITIVE (A) 03/09/2015 1500   LEUKOCYTESUR MODERATE (A) 03/09/2015 1500   Sepsis Labs: No results found for this or any previous visit (from the past 240 hour(s)).   Radiological Exams on Admission: Dg Chest 2 View  Result Date: 04/16/2016 CLINICAL DATA:  Shortness of breath and nausea for 2 days. EXAM: CHEST  2 VIEW COMPARISON:  02/20/2014 FINDINGS: Lungs are hyperexpanded. Interstitial markings are diffusely coarsened with chronic features. No edema or focal airspace consolidation. No pleural effusion. The cardiopericardial silhouette is within normal limits for size. Bones are diffusely demineralized. Mid thoracic compression fracture again noted. IMPRESSION:  Emphysema without acute cardiopulmonary findings. Electronically Signed   By: Misty Stanley M.D.   On: 04/16/2016 20:18    EKG: Independently reviewed. Sinus tachycardia with ST depressions  Assessment/Plan Sepsis 2/2 suspect respiratory illness: Acute. Patient presents with reports fever up to 101.8F, tachycardic, and tachypneic. Presumed respiratory source. Sepsis protocol initiated patient given fluid bolus. - Admit to telemetry bed - sepsis protocol - Follow-up cultures and influenza screen - Continue empiric antibiotics of ceftriaxone and azithromycin - Trend lactic acid levels  Acute respiratory failure with hypoxia/COPD exacerbation: Patient's O2 saturations dropped to  88% on room  air for which she was placed on 2 L nasal cannula oxygen. - Continuous pulse oximetry overnight with nasal cannula oxygen and maintain O2 sats - DuoNeb's - Budesonide and Brovana nebs  - Mucinex - Tessalon Perlesm prn cough  Nausea - Zofran prn N/V  Hypokalemia: Acute. Patient initial potassium 3.2. Given 40 potassium chloride in the ED - Continue to monitor and replace as needed  Dehydration: Patient with elevated BUN to creatinine ratio to suggest signs of dehydration. - Continue IV fluids  Paroxysmal atrial fibrillation on chronic anticoagulation: Patient on Coumadin with therapeutic INR chadsvasc= 4. Heart rates improved after IV fluids. - Continue diltiazem and Coumadin per pharmacy  Essential hypertension - Continue Cozaar - Held HCTZ 2/2 signs of dehydration, restart when medically appropriate  Hypothyroidism - Continue levothyroxine  DVT prophylaxis: On Coumadin Code Status: Full Family Communication: No family present at bedside Disposition Plan: Likely discharge home in 2 to 3 days Consults called: none Admission status: Inpatient  Norval Morton MD [facility] Pager [phone]  If 7PM-7AM, please contact night-coverage [email or web address] Password  [email or web address]  04/16/2016, 10:06 PM

## 2016-04-16 NOTE — ED Notes (Addendum)
Pt is alert and oriented x 4 and has been having a  productive cough x 2 two days with increasing SOB at rest,  With expiratory wheezes pt does report having some nausea denies  Vomiting/ diarrhea.  Pt does report fever and chills.

## 2016-04-16 NOTE — ED Notes (Signed)
Pt SATURATIONS BETWEEN 88-93%  ADMINISTERED 2 LPM OXYGEN VIA [place]

## 2016-04-16 NOTE — ED Notes (Signed)
First set of blood cultures obtained 

## 2016-04-16 NOTE — Progress Notes (Signed)
Pharmacy: ceftriaxone and azithromycin  Patient's an 83 y.o F presented to the ED on 04/16/16 with c/o cough and SOB.  To start ceftriaxone and azithromycin for CAP.   Plan: - azithromycin 500 mg IV q24h - ceftriaxone 1 gm IV q24h - Pharmacy will sign off since no renal adj is needed with these abx - re-consult [place] if need further assistance  Thank you for asking pharmacy to participate in this patient's care.  Dia Sitter, PharmD, BCPS 04/16/2016 8:02 PM

## 2016-04-16 NOTE — ED Triage Notes (Signed)
Pt brought in by EMS from home, and has had SOB x 2 days.EMS was given albuterol and Atrovent enroute. Pt has a productive cought. Pt husband is currently admitted to to [facility] for pneumonia. Pt has HX of AFIB.

## 2016-04-16 NOTE — Progress Notes (Signed)
ANTICOAGULATION CONSULT NOTE - Initial Consult  Pharmacy Consult for warfarin Indication: atrial fibrillation  Allergies  Allergen Reactions  . Ace Inhibitors Cough  . Sulfur Nausea And Vomiting    Patient Measurements:   Heparin Dosing Weight:   Vital Signs: Temp: 98.6 F (37 C) (01/17 1932) Temp Source: Oral (01/17 1932) BP: 100/50 (01/17 2234) Pulse Rate: 105 (01/17 2234)  Labs:  Recent Labs  04/16/16 2018 04/16/16 2019  HGB  --  14.6  HCT  --  42.2  PLT  --  248  LABPROT 23.9*  --   INR 2.10  --   CREATININE  --  0.60    CrCl cannot be calculated (Unknown ideal weight.).   Medical History: Past Medical History:  Diagnosis Date  . Anxiety   . Arthritis   . Atrial fibrillation ([place])   . COPD (chronic obstructive pulmonary disease) ([place])   . Dysrhythmia    AF  . Essential hypertension, benign   . Gallstones   . GERD (gastroesophageal reflux disease)   . H/O hiatal hernia   . History of nuclear stress test 10/2010   dipyridamole; normal pattern of perfusion; low risk, normal study  . 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 Mild aortic insufficiency   . Osteoporosis   . Peripheral neuropathy ([place])   . Pneumonia    states she had it twice, last time a couple of months ago  . PONV (postoperative nausea and vomiting)   . Shortness of breath    with exertion  . Unspecified hypothyroidism   . Venous insufficiency     Medications:   (Not in a hospital admission) Scheduled:  . [START ON 04/17/2016] albuterol  2.5 mg Nebulization Q6H  . amitriptyline  25 mg Oral QHS  . arformoterol  15 mcg Nebulization BID  . budesonide (PULMICORT) nebulizer solution  0.5 mg Nebulization BID  . [START ON 04/17/2016] diltiazem  180 mg Oral Daily  . guaiFENesin  600 mg Oral BID  . [START ON 04/17/2016] ipratropium  0.5 mg Nebulization Q6H  . [START ON 04/17/2016] levothyroxine  137 mcg Oral QAC breakfast  . [START ON 04/17/2016] losartan   100 mg Oral Daily  . [START ON 04/17/2016] naphazoline-glycerin  1-2 drop Both Eyes Daily  . potassium chloride  40 mEq Oral Once  . [START ON 04/17/2016] warfarin  2.5 mg Oral Once per day on Sun Mon Wed Thu Fri Sat  . [START ON 04/22/2016] warfarin  3.75 mg Oral Once per day on Tue  . [START ON 04/17/2016] Warfarin - Pharmacist Dosing Inpatient   Does not apply q1800    Assessment: Patient on warfarin for afib.  INR at goal on admit; with last dose noted to be 1/17 at 1700.  Goal of Therapy:  INR 2-3    Plan:  Daily INR Continue patient's home warfarin regimen  Nani Skillern [place] 04/16/2016,10:56 PM

## 2016-04-16 NOTE — ED Provider Notes (Signed)
[name] DEPT Provider Note   CSN: [id number] Arrival date & time: 04/16/16  1926     History   Chief Complaint Chief Complaint  Patient presents with  . Shortness of Breath    HPI Heather [name] is a 83 y.o. female.  HPI   Presents with concern for shortness of breath for 2 days, cough productive of yellow to cream colored sputum, nausea, fever of 101.7 at home.  Husband is currently hospitalized with pneumonia.  Patient reports she thought she could ride out cough at home however her cough got worse, she developed dyspnea and nausea as well as fever today.  Did not take anything at home for fever, but it spontaneously improved here.   Past Medical History:  Diagnosis Date  . Anxiety   . Arthritis   . Atrial fibrillation ([place])   . COPD (chronic obstructive pulmonary disease) ([place])   . Dysrhythmia    AF  . Essential hypertension, benign   . Gallstones   . GERD (gastroesophageal reflux disease)   . H/O hiatal hernia   . History of nuclear stress test 10/2010   dipyridamole; normal pattern of perfusion; low risk, normal study  . 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 Mild aortic insufficiency   . Osteoporosis   . Peripheral neuropathy ([place])   . Pneumonia    states she had it twice, last time a couple of months ago  . PONV (postoperative nausea and vomiting)   . Shortness of breath    with exertion  . Unspecified hypothyroidism   . Venous insufficiency     Patient Active Problem List   Diagnosis Date Noted  . Influenza-like illness 04/16/2016  . Sepsis ([place]) 04/16/2016  . Post herpetic neuralgia 06/06/2014  . Shingles outbreak 04/19/2014  . Gallstones 09/20/2013  . Community acquired pneumonia 05/17/2013  . COPD (chronic obstructive pulmonary disease) ([place]) 05/17/2013  . Dyspnea on exertion 04/25/2013  . Pulmonary emphysema ([place]) 11/15/2012  . HTN (hypertension) 11/15/2012  . PAF (paroxysmal atrial fibrillation) ([place])  06/14/2012  . Long term current use of anticoagulant therapy 06/14/2012  . Ventral hernia 07/31/2011  . Abdominal wall mass 07/01/2011    Past Surgical History:  Procedure Laterality Date  . APPENDECTOMY  1935  . BACK SURGERY     x2  . Cardiometablic Testing  12/21/3005   submaximal effort with peak RER of 0.5, peak VO2 79% predicted; HR peak up to 78%; PVC was 55% predicted, PEV1 39% predicted; PEV1/VC ratio was reduced; normal vital capacity; DLCO was reduced to 63%  . CARPAL TUNNEL RELEASE    . CATARACT EXTRACTION W/ INTRAOCULAR LENS  IMPLANT, BILATERAL    . CHOLECYSTECTOMY  04/07/2014   dr toth  . CHOLECYSTECTOMY N/A 04/07/2014   Procedure: LAPAROSCOPIC CHOLECYSTECTOMY WITH INTRAOPERATIVE CHOLANGIOGRAM POSSBILE OPEN;  Surgeon: Autumn Messing III, MD;  Location: [facility];  Service: General;  Laterality: N/A;  . COLON SURGERY  2008   partial  . HERNIA REPAIR    . JOINT REPLACEMENT    . left hip relacement  2000  . TONSILLECTOMY  1935  . TOTAL ABDOMINAL HYSTERECTOMY  1970  . TRANSTHORACIC ECHOCARDIOGRAM  05/2011   EF=>55%; mild conc LVH; mild mitral annular calcif; mild TR; AV mildly sclerotic & mild AR  . VENTRAL HERNIA REPAIR  09/19/2011   Procedure: LAPAROSCOPIC VENTRAL HERNIA;  Surgeon: Merrie Roof, MD;  Location: [facility];  Service: General;  Laterality: N/A;  laparoscopic ventral hernia repair with mesh  OB History    No data available       Home Medications    Prior to Admission medications   Medication Sig Start Date End Date Taking? Authorizing Provider  albuterol (PROVENTIL HFA;VENTOLIN HFA) 108 (90 BASE) MCG/ACT inhaler Inhale 2 puffs into the lungs every 6 (six) hours as needed for wheezing or shortness of breath. 05/23/13  Yes Burnard Bunting, MD  amitriptyline (ELAVIL) 25 MG tablet Take 25 mg by mouth at bedtime.   Yes Historical Provider, MD  Budesonide-Formoterol Fumarate (SYMBICORT IN) Inhale into the lungs 2 (two) times daily.   Yes Historical Provider, MD    calcium citrate-vitamin D (CITRACAL+D) 315-200 MG-UNIT per tablet Take 1 tablet by mouth daily.   Yes Historical Provider, MD  diltiazem (CARDIZEM CD) 180 MG 24 hr capsule Take 180 mg by mouth daily.   Yes Historical Provider, MD  hydrochlorothiazide (HYDRODIURIL) 25 MG tablet Take 1 tablet (25 mg total) by mouth daily. 04/11/16  Yes Pixie Casino, MD  levothyroxine (SYNTHROID, LEVOTHROID) 137 MCG tablet Take 137 mcg by mouth daily before breakfast.    Yes Historical Provider, MD  losartan (COZAAR) 100 MG tablet TAKE 1 TABLET BY MOUTH EVERY DAY KEEP OFFICE VISIT 04/11/16  Yes Pixie Casino, MD  Multiple Vitamins-Minerals (MULTIVITAMIN WITH MINERALS) tablet Take 1 tablet by mouth daily.   Yes Historical Provider, MD  naphazoline-glycerin (CLEAR EYES) 0.012-0.2 % SOLN Place 1-2 drops into both eyes daily.    Yes Historical Provider, MD  polyethylene glycol (MIRALAX / GLYCOLAX) packet Take 17 g by mouth daily as needed for moderate constipation or severe constipation.   Yes Historical Provider, MD  warfarin (COUMADIN) 2.5 MG tablet TAKE ONE TO ONE & ONE-HALF TABLETS BY MOUTH ONCE DAILY AS DIRECTED Patient taking differently: TAKE ONE TABLET(2.5 mg) ON Monday,WEDNESDAY, Thursday, Friday, Saturday AND Sunday TAKE  ONE & ONE-HALF TABLET(3.75 mg) ON Tuesday  AS DIRECTED 04/11/16  Yes Pixie Casino, MD    Family History Family History  Problem Relation Age of Onset  . Stroke Mother   . Stroke Father   . Heart block Brother   . Bladder Cancer Brother   . Diabetes Brother   . Stroke Brother     Social History Social History  Substance Use Topics  . Smoking status: Former Smoker    Packs/day: 1.00    Years: 20.00    Types: Cigarettes    Quit date: 09/11/1988  . Smokeless tobacco: Never Used  . Alcohol use 0.0 oz/week     Comment: occ     Allergies   Ace inhibitors and Sulfur   Review of Systems Review of Systems   Physical Exam Updated Vital Signs BP (!) 100/50 (BP Location:  Right Arm)   Pulse 105   Temp 98.6 F (37 C) (Oral)   Resp 25   SpO2 97%   Physical Exam  Constitutional: She is oriented to person, place, and time. She appears well-developed and well-nourished. She appears ill. No distress.  HENT:  Head: Normocephalic and atraumatic.  Eyes: Conjunctivae and EOM are normal.  Neck: Normal range of motion.  Cardiovascular: Normal rate, regular rhythm, normal heart sounds and intact distal pulses.  Exam reveals no gallop and no friction rub.   No murmur heard. Pulmonary/Chest: Effort normal. Tachypnea noted. No respiratory distress. She has decreased breath sounds. She has no wheezes. She has no rales.  Abdominal: Soft. She exhibits no distension. There is no tenderness. There is no guarding.  Musculoskeletal:  She exhibits no edema or tenderness.  Neurological: She is alert and oriented to person, place, and time.  Skin: Skin is warm and dry. No rash noted. She is not diaphoretic. No erythema.  Nursing note and vitals reviewed.    ED Treatments / Results  Labs (all labs ordered are listed, but only abnormal results are displayed) Labs Reviewed  COMPREHENSIVE METABOLIC PANEL - Abnormal; Notable for the following:       Result Value   Potassium 3.2 (*)    Glucose, Bld 156 (*)    BUN 24 (*)    Total Bilirubin 1.3 (*)    All other components within normal limits  CBC WITH DIFFERENTIAL/PLATELET - Abnormal; Notable for the following:    Neutro Abs 8.3 (*)    All other components within normal limits  PROTIME-INR - Abnormal; Notable for the following:    Prothrombin Time 23.9 (*)    All other components within normal limits  I-STAT CG4 LACTIC ACID, ED - Abnormal; Notable for the following:    Lactic Acid, Venous 2.69 (*)    All other components within normal limits  CULTURE, BLOOD (ROUTINE X 2)  CULTURE, BLOOD (ROUTINE X 2)  URINE CULTURE  CULTURE, EXPECTORATED SPUTUM-ASSESSMENT  URINALYSIS, ROUTINE W REFLEX MICROSCOPIC  INFLUENZA PANEL BY  PCR (TYPE A & B)  LACTIC ACID, PLASMA  LACTIC ACID, PLASMA  PROCALCITONIN  CBC  COMPREHENSIVE METABOLIC PANEL  STREP PNEUMONIAE URINARY ANTIGEN  LEGIONELLA PNEUMOPHILA SEROGP 1 UR AG  TROPONIN I  I-STAT CG4 LACTIC ACID, ED    EKG  EKG Interpretation  Date/Time:  Wednesday April 16 2016 19:39:40 EST Ventricular Rate:  136 PR Interval:    QRS Duration: 80 QT Interval:  300 QTC Calculation: 452 R Axis:   72 Text Interpretation:  Sinus tachycardia ST depression, probably rate related Since prior ECG, premature ventricular complexes new, rate has increased, ST depressions rate related new Confirmed by [facility] MD, [place] ([id number]) on 04/16/2016 7:59:46 PM       Radiology Dg Chest 2 View  Result Date: 04/16/2016 CLINICAL DATA:  Shortness of breath and nausea for 2 days. EXAM: CHEST  2 VIEW COMPARISON:  02/20/2014 FINDINGS: Lungs are hyperexpanded. Interstitial markings are diffusely coarsened with chronic features. No edema or focal airspace consolidation. No pleural effusion. The cardiopericardial silhouette is within normal limits for size. Bones are diffusely demineralized. Mid thoracic compression fracture again noted. IMPRESSION: Emphysema without acute cardiopulmonary findings. Electronically Signed   By: Misty Stanley M.D.   On: 04/16/2016 20:18    Procedures Procedures (including critical care time)  Medications Ordered in ED Medications  azithromycin (ZITHROMAX) 500 mg in dextrose 5 % 250 mL IVPB (not administered)  cefTRIAXone (ROCEPHIN) 1 g in dextrose 5 % 50 mL IVPB (not administered)  potassium chloride SA (K-DUR,KLOR-CON) CR tablet 40 mEq (not administered)  naphazoline-glycerin (CLEAR EYES) ophth solution 1-2 drop (not administered)  diltiazem (CARDIZEM CD) 24 hr capsule 180 mg (not administered)  levothyroxine (SYNTHROID, LEVOTHROID) tablet 137 mcg (not administered)  amitriptyline (ELAVIL) tablet 25 mg (not administered)  losartan (COZAAR) tablet 100 mg (not  administered)  0.9 %  sodium chloride infusion (not administered)  acetaminophen (TYLENOL) tablet 650 mg (not administered)    Or  acetaminophen (TYLENOL) suppository 650 mg (not administered)  ondansetron (ZOFRAN) tablet 4 mg (not administered)    Or  ondansetron (ZOFRAN) injection 4 mg (not administered)  ipratropium (ATROVENT) nebulizer solution 0.5 mg (not administered)  albuterol (PROVENTIL) (2.5 MG/3ML) 0.083% nebulizer  solution 2.5 mg (not administered)  albuterol (PROVENTIL) (2.5 MG/3ML) 0.083% nebulizer solution 2.5 mg (not administered)  guaiFENesin (MUCINEX) 12 hr tablet 600 mg (not administered)  budesonide (PULMICORT) nebulizer solution 0.5 mg (not administered)  arformoterol (BROVANA) nebulizer solution 15 mcg (not administered)  benzonatate (TESSALON) capsule 200 mg (not administered)  sodium chloride 0.9 % bolus 1,000 mL (1,000 mLs Intravenous New Bag/Given 04/16/16 2033)    And  sodium chloride 0.9 % bolus 500 mL (0 mLs Intravenous Stopped 04/16/16 2249)    And  sodium chloride 0.9 % bolus 250 mL (250 mLs Intravenous New Bag/Given 04/16/16 2100)  cefTRIAXone (ROCEPHIN) 1 g in dextrose 5 % 50 mL IVPB (0 g Intravenous Stopped 04/16/16 2134)  azithromycin (ZITHROMAX) 500 mg in dextrose 5 % 250 mL IVPB (500 mg Intravenous New Bag/Given 04/16/16 2100)  ondansetron (ZOFRAN) injection 4 mg (4 mg Intravenous Given 04/16/16 2035)  oseltamivir (TAMIFLU) capsule 30 mg (30 mg Oral Given 04/16/16 2040)     Initial Impression / Assessment and Plan / ED Course  I have reviewed the triage vital signs and the nursing notes.  Pertinent labs & imaging results that were available during my care of the patient were reviewed by me and considered in my medical decision making (see chart for details).  Clinical Course    83 year old female with a history of atrial fibrillation on Coumadin, hypertension, COPD per chart however patient denies but states lungs "at 50%", with husband who is currently  hospitalized for pneumonia, presents with concern for cough, congestion, shortness of breath, nausea and fever.  Patient tachycardic and tachypnea on arrival to the emergency department. She is febrile at home, however is afebrile here. Treated her as code sepsis, with patient receiving 30 mL/kg normal saline. The was given Rocephin and azithromycin for concern of community-acquired pneumonia, as well as empiric Tamiflu for possible influenza. Chest x-ray returned showing no sign of pneumonia. Influenza and urine pending.  Patient now on 4L O2. Blood pressures stable, HR improved to 110, feel she is appropriate for telemetry at this time.  Pt received nebulizer treatments with EMS, no significant wheezing on my exam.  Will admit for concern for sepsis secondary to likely influenza, testing or urine/influenza pending and hypoxia.   Final Clinical Impressions(s) / ED Diagnoses   Final diagnoses:  Sepsis, due to unspecified organism [facility])  Influenza-like illness  Hypoxia    New Prescriptions New Prescriptions   No medications on file     Gareth Morgan, MD 04/16/16 2257

## 2016-04-16 NOTE — ED Notes (Signed)
Bed: WA17 Expected date:  Expected time:  Means of arrival:  Comments: 83 yo Shortness of breath

## 2016-04-17 DIAGNOSIS — A419 Sepsis, unspecified organism: Principal | ICD-10-CM

## 2016-04-17 DIAGNOSIS — J111 Influenza due to unidentified influenza virus with other respiratory manifestations: Secondary | ICD-10-CM

## 2016-04-17 LAB — RESPIRATORY PANEL BY PCR
Adenovirus: NOT DETECTED
BORDETELLA PERTUSSIS-RVPCR: NOT DETECTED
Chlamydophila pneumoniae: NOT DETECTED
Coronavirus 229E: NOT DETECTED
Coronavirus HKU1: NOT DETECTED
Coronavirus NL63: NOT DETECTED
Coronavirus OC43: NOT DETECTED
INFLUENZA B-RVPPCR: NOT DETECTED
Influenza A H3: DETECTED — AB
METAPNEUMOVIRUS-RVPPCR: NOT DETECTED
Mycoplasma pneumoniae: NOT DETECTED
PARAINFLUENZA VIRUS 2-RVPPCR: NOT DETECTED
PARAINFLUENZA VIRUS 3-RVPPCR: NOT DETECTED
Parainfluenza Virus 1: NOT DETECTED
Parainfluenza Virus 4: NOT DETECTED
RESPIRATORY SYNCYTIAL VIRUS-RVPPCR: NOT DETECTED
Rhinovirus / Enterovirus: NOT DETECTED

## 2016-04-17 LAB — CBC
HEMATOCRIT: 34.1 % — AB (ref 36.0–46.0)
HEMOGLOBIN: 11.7 g/dL — AB (ref 12.0–15.0)
MCH: 33.3 pg (ref 26.0–34.0)
MCHC: 34.3 g/dL (ref 30.0–36.0)
MCV: 97.2 fL (ref 78.0–100.0)
Platelets: 218 10*3/uL (ref 150–400)
RBC: 3.51 MIL/uL — AB (ref 3.87–5.11)
RDW: 14.1 % (ref 11.5–15.5)
WBC: 8.5 10*3/uL (ref 4.0–10.5)

## 2016-04-17 LAB — EXPECTORATED SPUTUM ASSESSMENT W GRAM STAIN, RFLX TO RESP C

## 2016-04-17 LAB — LACTIC ACID, PLASMA: LACTIC ACID, VENOUS: 1.6 mmol/L (ref 0.5–1.9)

## 2016-04-17 LAB — COMPREHENSIVE METABOLIC PANEL
ALBUMIN: 3.3 g/dL — AB (ref 3.5–5.0)
ALK PHOS: 55 U/L (ref 38–126)
ALT: 17 U/L (ref 14–54)
AST: 29 U/L (ref 15–41)
Anion gap: 8 (ref 5–15)
BILIRUBIN TOTAL: 1.1 mg/dL (ref 0.3–1.2)
BUN: 19 mg/dL (ref 6–20)
CALCIUM: 7.8 mg/dL — AB (ref 8.9–10.3)
CO2: 23 mmol/L (ref 22–32)
CREATININE: 0.55 mg/dL (ref 0.44–1.00)
Chloride: 103 mmol/L (ref 101–111)
GFR calc Af Amer: >60 mL/min (ref >60)
GFR calc non Af Amer: >60 mL/min (ref >60)
GLUCOSE: 142 mg/dL — AB (ref 65–99)
Potassium: 3.5 mmol/L (ref 3.5–5.1)
SODIUM: 134 mmol/L — AB (ref 135–145)
Total Protein: 5.4 g/dL — ABNORMAL LOW (ref 6.5–8.1)

## 2016-04-17 LAB — STREP PNEUMONIAE URINARY ANTIGEN: Strep Pneumo Urinary Antigen: NEGATIVE

## 2016-04-17 LAB — PROTIME-INR
INR: 2.49
Prothrombin Time: 27.4 seconds — ABNORMAL HIGH (ref 11.4–15.2)

## 2016-04-17 LAB — EXPECTORATED SPUTUM ASSESSMENT W REFEX TO RESP CULTURE

## 2016-04-17 MED ORDER — IPRATROPIUM-ALBUTEROL 0.5-2.5 (3) MG/3ML IN SOLN
3.0000 mL | Freq: Four times a day (QID) | RESPIRATORY_TRACT | Status: DC
  Administered 2016-04-17 – 2016-04-18 (×4): 3 mL via RESPIRATORY_TRACT
  Filled 2016-04-17 (×3): qty 3

## 2016-04-17 MED ORDER — OSELTAMIVIR PHOSPHATE 30 MG PO CAPS
30.0000 mg | ORAL_CAPSULE | Freq: Two times a day (BID) | ORAL | Status: DC
  Administered 2016-04-17 – 2016-04-19 (×5): 30 mg via ORAL
  Filled 2016-04-17 (×6): qty 1

## 2016-04-17 MED ORDER — AZITHROMYCIN 250 MG PO TABS
500.0000 mg | ORAL_TABLET | Freq: Every day | ORAL | Status: DC
  Administered 2016-04-17 – 2016-04-19 (×3): 500 mg via ORAL
  Filled 2016-04-17 (×3): qty 2

## 2016-04-17 MED ORDER — PREDNISONE 20 MG PO TABS
40.0000 mg | ORAL_TABLET | Freq: Every day | ORAL | Status: DC
  Administered 2016-04-17 – 2016-04-19 (×3): 40 mg via ORAL
  Filled 2016-04-17 (×3): qty 2

## 2016-04-17 MED ORDER — PREDNISOLONE 5 MG PO TABS: 40.0000 mg | ORAL_TABLET | Freq: Every day | ORAL | Status: DC

## 2016-04-17 MED ORDER — IPRATROPIUM-ALBUTEROL 0.5-2.5 (3) MG/3ML IN SOLN
3.0000 mL | Freq: Three times a day (TID) | RESPIRATORY_TRACT | Status: DC
  Administered 2016-04-17: 3 mL via RESPIRATORY_TRACT

## 2016-04-17 NOTE — Progress Notes (Signed)
PROGRESS NOTE    Heather [name]  DGL:[id number] DOB: 01-01-1927 DOA: 04/16/2016 PCP: Geoffery Lyons, MD     Brief Narrative:  Heather [name] is a 83 y.o. female with medical history significant of COPD, afib on anticoagulation, hypothyroidism; who presents with 2 days of productive cough and shortness of breath. At baseline she is not on oxygen at home. She reports developing a productive cough with yellowish sputum. Associated symptoms include wheezing, nausea, fever up to 101.9F at home, malaise, and sore throat.   Assessment & Plan:   Principal Problem:   Sepsis ([place]) Active Problems:   PAF (paroxysmal atrial fibrillation) ([place])   Long term current use of anticoagulant therapy   HTN (hypertension)   COPD (chronic obstructive pulmonary disease) ([facility])   Influenza-like illness   Hypokalemia   Hypothyroidism  Sepsis 2/2 influenza  -Continue tamiflu, total 5 days -IVF   Acute respiratory failure with hypoxia secondary to influenza, COPD exacerbation - O2 as needed to maintain sat > 90%  -Nebs -Continue azithromycin -Prednisone burst for 5 days   Paroxysmal atrial fibrillation on chronic anticoagulation -Chadsvasc= 4 -Continue diltiazem and Coumadin per pharmacy  Essential hypertension -Continue Cozaar  Hypothyroidism -Continue levothyroxine   DVT prophylaxis: Coumadin  Code Status: Full  Family Communication: no family at bedside Disposition Plan: pending further improvement, discharge to home   Consultants:   None  Procedures:   None  Antimicrobials:  Anti-infectives    Start     Dose/Rate Route Frequency Ordered Stop   04/17/16 2000  azithromycin (ZITHROMAX) 500 mg in dextrose 5 % 250 mL IVPB  Status:  Discontinued     500 mg 250 mL/hr over 60 Minutes Intravenous Every 24 hours 04/16/16 2004 04/17/16 0741   04/17/16 2000  cefTRIAXone (ROCEPHIN) 1 g in dextrose 5 % 50 mL IVPB  Status:  Discontinued     1 g 100 mL/hr over 30 Minutes Intravenous  Every 24 hours 04/16/16 2004 04/17/16 0741   04/17/16 1000  oseltamivir (TAMIFLU) capsule 30 mg     30 mg Oral 2 times daily 04/17/16 0645 04/22/16 0959   04/17/16 1000  azithromycin (ZITHROMAX) tablet 500 mg     500 mg Oral Daily 04/17/16 0741     04/16/16 2000  cefTRIAXone (ROCEPHIN) 1 g in dextrose 5 % 50 mL IVPB     1 g 100 mL/hr over 30 Minutes Intravenous  Once 04/16/16 1957 04/16/16 2134   04/16/16 2000  azithromycin (ZITHROMAX) 500 mg in dextrose 5 % 250 mL IVPB     500 mg 250 mL/hr over 60 Minutes Intravenous  Once 04/16/16 1957 04/16/16 2200   04/16/16 2000  oseltamivir (TAMIFLU) capsule 30 mg     30 mg Oral  Once 04/16/16 1957 04/16/16 2040       Subjective: Patient states that she is still feeling very ill. Has SOB, fever, malaise at home.    Objective: Vitals:   04/16/16 2350 04/17/16 0017 04/17/16 0019 04/17/16 0514  BP: (!) 102/49   112/63  Pulse: 95   81  Resp: (!) 24   (!) 21  Temp: 99.9 F (37.7 C)   99.3 F (37.4 C)  TempSrc: Oral   Oral  SpO2: 96% 96% 96% 97%  Weight: 53.6 kg (118 lb 2.7 oz)     Height: '5\' 3"'$  (1.6 m)       Intake/Output Summary (Last 24 hours) at 04/17/16 0842 Last data filed at 04/17/16 0542  Gross per 24 hour  Intake  2390 ml  Output                1 ml  Net             2389 ml   Filed Weights   04/16/16 2350  Weight: 53.6 kg (118 lb 2.7 oz)    Examination:  General exam: Appears calm and comfortable, mildly ill appearing  Respiratory system: diminished breath sounds, no wheezing this morning. Respiratory effort normal. On [place] O2  Cardiovascular system: S1 & S2 heard, RRR. No JVD, murmurs, rubs, gallops or clicks. No pedal edema. Gastrointestinal system: Abdomen is nondistended, soft and nontender. No organomegaly or masses felt. Normal bowel sounds heard. Central nervous system: Alert and oriented. No focal neurological deficits. Extremities: Symmetric 5 x 5 power. Skin: No rashes, lesions or ulcers Psychiatry:  Judgement and insight appear normal. Mood & affect appropriate.   Data Reviewed: I have personally reviewed following labs and imaging studies  CBC:  Recent Labs Lab 04/16/16 2019 04/17/16 0139  WBC 10.2 8.5  NEUTROABS 8.3*  --   HGB 14.6 11.7*  HCT 42.2 34.1*  MCV 95.5 97.2  PLT 248 536   Basic Metabolic Panel:  Recent Labs Lab 04/16/16 2019 04/17/16 0139  NA 139 134*  K 3.2* 3.5  CL 103 103  CO2 24 23  GLUCOSE 156* 142*  BUN 24* 19  CREATININE 0.60 0.55  CALCIUM 9.2 7.8*   GFR: Estimated Creatinine Clearance: 41 mL/min (by C-G formula based on SCr of 0.55 mg/dL). Liver Function Tests:  Recent Labs Lab 04/16/16 2019 04/17/16 0139  AST 30 29  ALT 23 17  ALKPHOS 75 55  BILITOT 1.3* 1.1  PROT 7.1 5.4*  ALBUMIN 4.5 3.3*   No results for input(s): LIPASE, AMYLASE in the last 168 hours. No results for input(s): AMMONIA in the last 168 hours. Coagulation Profile:  Recent Labs Lab 04/10/16 1049 04/16/16 2018 04/17/16 0139  INR 2.2 2.10 2.49   Cardiac Enzymes:  Recent Labs Lab 04/16/16 2304  TROPONINI <0.03   BNP (last 3 results) No results for input(s): PROBNP in the last 8760 hours. HbA1C: No results for input(s): HGBA1C in the last 72 hours. CBG: No results for input(s): GLUCAP in the last 168 hours. Lipid Profile: No results for input(s): CHOL, HDL, LDLCALC, TRIG, CHOLHDL, LDLDIRECT in the last 72 hours. Thyroid Function Tests: No results for input(s): TSH, T4TOTAL, FREET4, T3FREE, THYROIDAB in the last 72 hours. Anemia Panel: No results for input(s): VITAMINB12, FOLATE, FERRITIN, TIBC, IRON, RETICCTPCT in the last 72 hours. Sepsis Labs:  Recent Labs Lab 04/16/16 2032 04/16/16 2302 04/16/16 2304 04/17/16 0139  PROCALCITON  --   --  0.28  --   LATICACIDVEN 2.69* 2.1*  --  1.6    No results found for this or any previous visit (from the past 240 hour(s)).     Radiology Studies: Dg Chest 2 View  Result Date: 04/16/2016 CLINICAL  DATA:  Shortness of breath and nausea for 2 days. EXAM: CHEST  2 VIEW COMPARISON:  02/20/2014 FINDINGS: Lungs are hyperexpanded. Interstitial markings are diffusely coarsened with chronic features. No edema or focal airspace consolidation. No pleural effusion. The cardiopericardial silhouette is within normal limits for size. Bones are diffusely demineralized. Mid thoracic compression fracture again noted. IMPRESSION: Emphysema without acute cardiopulmonary findings. Electronically Signed   By: Misty Stanley M.D.   On: 04/16/2016 20:18      Scheduled Meds: . amitriptyline  25 mg Oral QHS  .  arformoterol  15 mcg Nebulization BID  . azithromycin  500 mg Oral Daily  . budesonide (PULMICORT) nebulizer solution  0.5 mg Nebulization BID  . diltiazem  180 mg Oral Daily  . guaiFENesin  600 mg Oral BID  . ipratropium-albuterol  3 mL Nebulization QID  . levothyroxine  137 mcg Oral QAC breakfast  . losartan  100 mg Oral Daily  . naphazoline-glycerin  1-2 drop Both Eyes Daily  . oseltamivir  30 mg Oral BID  . prednisoLONE  40 mg Oral Daily  . warfarin  2.5 mg Oral Once per day on Sun Mon Wed Thu Fri Sat  . [START ON 04/22/2016] warfarin  3.75 mg Oral Once per day on Tue  . Warfarin - Pharmacist Dosing Inpatient   Does not apply q1800   Continuous Infusions: . sodium chloride 75 mL/hr at 04/17/16 0040     LOS: 1 day    Time spent: 40 minutes   Dessa Phi, DO [facility] [email or web address] Password [facility] 04/17/2016, 8:42 AM

## 2016-04-17 NOTE — Progress Notes (Signed)
ANTICOAGULATION CONSULT NOTE - Follow Up Consult  Pharmacy Consult for Warfarin Indication: atrial fibrillation  Allergies  Allergen Reactions  . Ace Inhibitors Cough  . Sulfur Nausea And Vomiting    Patient Measurements: Height: '5\' 3"'$  (160 cm) Weight: 118 lb 2.7 oz (53.6 kg) IBW/kg (Calculated) : 52.4  Vital Signs: Temp: 99.3 F (37.4 C) (01/18 0514) Temp Source: Oral (01/18 0514) BP: 112/63 (01/18 0514) Pulse Rate: 81 (01/18 0514)  Labs:  Recent Labs  04/16/16 2018 04/16/16 2019 04/16/16 2304 04/17/16 0139  HGB  --  14.6  --  11.7*  HCT  --  42.2  --  34.1*  PLT  --  248  --  218  LABPROT 23.9*  --   --  27.4*  INR 2.10  --   --  2.49  CREATININE  --  0.60  --  0.55  TROPONINI  --   --  <0.03  --     Estimated Creatinine Clearance: 41 mL/min (by C-G formula based on SCr of 0.55 mg/dL).   Medications:  Scheduled:  . amitriptyline  25 mg Oral QHS  . arformoterol  15 mcg Nebulization BID  . azithromycin  500 mg Oral Daily  . budesonide (PULMICORT) nebulizer solution  0.5 mg Nebulization BID  . diltiazem  180 mg Oral Daily  . guaiFENesin  600 mg Oral BID  . ipratropium-albuterol  3 mL Nebulization TID  . levothyroxine  137 mcg Oral QAC breakfast  . losartan  100 mg Oral Daily  . naphazoline-glycerin  1-2 drop Both Eyes Daily  . oseltamivir  30 mg Oral BID  . warfarin  2.5 mg Oral Once per day on Sun Mon Wed Thu Fri Sat  . [START ON 04/22/2016] warfarin  3.75 mg Oral Once per day on Tue  . Warfarin - Pharmacist Dosing Inpatient   Does not apply q1800    Assessment: 83 y.o F presented to the ED on 04/16/16 with c/o cough and SOB, started on antibiotics and Tamiflu.  PMH includes atrial fibrillation on chronic warfarin anticoagulation.  Pharmacy is consulted for warfarin dosing.    Home dose warfarin 2.5 mg daily except 3.75 mg on Tuesdays. Admission INR 2.1 is therapeutic  Today, 04/17/2016:  INR 2.49, increased but remains therapeutic  CBC: Hgb  decreased to 11.7, Plt WNL  No bleeding or complications reported.  Drug-drug interaction: Azithromycin may increase INR  Goal of Therapy:  INR 2-3 Monitor platelets by anticoagulation protocol: Yes   Plan:   Continue home Warfarin dosing, 2.5 mg daily except 3.75 mg on Tuesdays.  Daily PT/INR.  Monitor for signs and symptoms of bleeding.  Gretta Arab PharmD, BCPS Pager [phone] 04/17/2016 8:24 AM

## 2016-04-18 LAB — CBC WITH DIFFERENTIAL/PLATELET
BASOS ABS: 0 10*3/uL (ref 0.0–0.1)
Basophils Relative: 1 %
Eosinophils Absolute: 0 10*3/uL (ref 0.0–0.7)
Eosinophils Relative: 0 %
HCT: 32.8 % — ABNORMAL LOW (ref 36.0–46.0)
HEMOGLOBIN: 11.8 g/dL — AB (ref 12.0–15.0)
LYMPHS PCT: 22 %
Lymphs Abs: 0.7 10*3/uL (ref 0.7–4.0)
MCH: 33.9 pg (ref 26.0–34.0)
MCHC: 36 g/dL (ref 30.0–36.0)
MCV: 94.3 fL (ref 78.0–100.0)
MONOS PCT: 15 %
Monocytes Absolute: 0.5 10*3/uL (ref 0.1–1.0)
NEUTROS ABS: 2 10*3/uL (ref 1.7–7.7)
NEUTROS PCT: 62 %
Platelets: 138 10*3/uL — ABNORMAL LOW (ref 150–400)
RBC: 3.48 MIL/uL — ABNORMAL LOW (ref 3.87–5.11)
RDW: 13.8 % (ref 11.5–15.5)
WBC: 3.2 10*3/uL — ABNORMAL LOW (ref 4.0–10.5)

## 2016-04-18 LAB — BASIC METABOLIC PANEL
ANION GAP: 6 (ref 5–15)
BUN: 13 mg/dL (ref 6–20)
CHLORIDE: 104 mmol/L (ref 101–111)
CO2: 27 mmol/L (ref 22–32)
Calcium: 8 mg/dL — ABNORMAL LOW (ref 8.9–10.3)
Creatinine, Ser: 0.43 mg/dL — ABNORMAL LOW (ref 0.44–1.00)
GFR calc non Af Amer: >60 mL/min (ref >60)
GLUCOSE: 103 mg/dL — AB (ref 65–99)
Potassium: 3.1 mmol/L — ABNORMAL LOW (ref 3.5–5.1)
Sodium: 137 mmol/L (ref 135–145)

## 2016-04-18 LAB — URINE CULTURE: Culture: NO GROWTH

## 2016-04-18 LAB — PROTIME-INR
INR: 1.91
PROTHROMBIN TIME: 22.2 s — AB (ref 11.4–15.2)

## 2016-04-18 MED ORDER — IPRATROPIUM-ALBUTEROL 0.5-2.5 (3) MG/3ML IN SOLN
3.0000 mL | Freq: Three times a day (TID) | RESPIRATORY_TRACT | Status: DC
  Administered 2016-04-18 – 2016-04-19 (×3): 3 mL via RESPIRATORY_TRACT
  Filled 2016-04-18 (×3): qty 3

## 2016-04-18 MED ORDER — POTASSIUM CHLORIDE CRYS ER 20 MEQ PO TBCR
40.0000 meq | EXTENDED_RELEASE_TABLET | ORAL | Status: AC
  Administered 2016-04-18 (×2): 40 meq via ORAL
  Filled 2016-04-18 (×2): qty 2

## 2016-04-18 MED ORDER — WARFARIN SODIUM 2.5 MG PO TABS
3.7500 mg | ORAL_TABLET | Freq: Once | ORAL | Status: AC
  Administered 2016-04-18: 3.75 mg via ORAL
  Filled 2016-04-18: qty 1.5

## 2016-04-18 NOTE — Progress Notes (Signed)
ANTICOAGULATION CONSULT NOTE - Follow Up Consult  Pharmacy Consult for Warfarin Indication: atrial fibrillation  Allergies  Allergen Reactions  . Ace Inhibitors Cough  . Sulfur Nausea And Vomiting    Patient Measurements: Height: '5\' 3"'$  (160 cm) Weight: 118 lb 2.7 oz (53.6 kg) IBW/kg (Calculated) : 52.4  Vital Signs: Temp: 98.3 F (36.8 C) (01/19 0548) Temp Source: Oral (01/19 0548) BP: 113/60 (01/19 0548) Pulse Rate: 81 (01/19 0548)  Labs:  Recent Labs  04/16/16 2018  04/16/16 2019 04/16/16 2304 04/17/16 0139 04/18/16 0453  HGB  --   < > 14.6  --  11.7* 11.8*  HCT  --   --  42.2  --  34.1* 32.8*  PLT  --   --  248  --  218 138*  LABPROT 23.9*  --   --   --  27.4* 22.2*  INR 2.10  --   --   --  2.49 1.91  CREATININE  --   --  0.60  --  0.55 0.43*  TROPONINI  --   --   --  <0.03  --   --   < > = values in this interval not displayed.  Estimated Creatinine Clearance: 41 mL/min (by C-G formula based on SCr of 0.43 mg/dL (L)).   Medications:  Scheduled:  . amitriptyline  25 mg Oral QHS  . arformoterol  15 mcg Nebulization BID  . azithromycin  500 mg Oral Daily  . budesonide (PULMICORT) nebulizer solution  0.5 mg Nebulization BID  . diltiazem  180 mg Oral Daily  . guaiFENesin  600 mg Oral BID  . ipratropium-albuterol  3 mL Nebulization TID  . levothyroxine  137 mcg Oral QAC breakfast  . losartan  100 mg Oral Daily  . naphazoline-glycerin  1-2 drop Both Eyes Daily  . oseltamivir  30 mg Oral BID  . potassium chloride  40 mEq Oral Q4H  . predniSONE  40 mg Oral Q breakfast  . warfarin  2.5 mg Oral Once per day on Sun Mon Wed Thu Fri Sat  . [START ON 04/22/2016] warfarin  3.75 mg Oral Once per day on Tue  . Warfarin - Pharmacist Dosing Inpatient   Does not apply q1800    Assessment: 83 y.o F presented to the ED on 04/16/16 with c/o cough and SOB, started on antibiotics and Tamiflu.  PMH includes atrial fibrillation on chronic warfarin anticoagulation.  Pharmacy is  consulted for warfarin dosing.    Home dose warfarin 2.5 mg daily except 3.75 mg on Tuesdays. Admission INR 2.1 is therapeutic  Today, 04/18/2016:  INR down to 1.91 with warfarin resumed on 1/18  CBC: Hgb low but stable at 11.8, plt down from 218 to 138  No bleeding documented  Drug-drug interaction: Azithromycin may increase INR  Goal of Therapy:  INR 2-3 Monitor platelets by anticoagulation protocol: Yes   Plan:   Warfarin 3.75 mg PO x1 today  Daily PT/INR.  Monitor cbc closely  Monitor for signs and symptoms of bleeding.  Dia Sitter, PharmD, BCPS 04/18/2016 11:13 AM

## 2016-04-18 NOTE — Progress Notes (Signed)
PROGRESS NOTE    Heather [name]  ZHY:[id number] DOB: Jan 25, 1927 DOA: 04/16/2016 PCP: Geoffery Lyons, MD     Brief Narrative:  Heather [name] is a 83 y.o. female with medical history significant of COPD, afib on anticoagulation, hypothyroidism; who presents with 2 days of productive cough and shortness of breath. At baseline she is not on oxygen at home. She reports developing a productive cough with yellowish sputum. Associated symptoms include wheezing, nausea, fever up to 101.55F at home, malaise, and sore throat.   Assessment & Plan:   Principal Problem:   Sepsis ([place]) Active Problems:   PAF (paroxysmal atrial fibrillation) ([place])   Long term current use of anticoagulant therapy   HTN (hypertension)   COPD (chronic obstructive pulmonary disease) ([facility])   Influenza-like illness   Hypokalemia   Hypothyroidism  Sepsis 2/2 influenza  -Continue tamiflu, total 5 days  Acute respiratory failure with hypoxia secondary to influenza, COPD exacerbation -Off O2 now -Nebs -Continue azithromycin -Prednisone burst for 5 days   Paroxysmal atrial fibrillation on chronic anticoagulation -Chadsvasc= 4 -Continue diltiazem and Coumadin per pharmacy  Essential hypertension -Continue Cozaar  Hypothyroidism -Continue levothyroxine   DVT prophylaxis: Coumadin  Code Status: Full  Family Communication: no family at bedside Disposition Plan: pending further improvement, discharge to home. PT eval ordered    Consultants:   None  Procedures:   None  Antimicrobials:  Anti-infectives    Start     Dose/Rate Route Frequency Ordered Stop   04/17/16 2000  azithromycin (ZITHROMAX) 500 mg in dextrose 5 % 250 mL IVPB  Status:  Discontinued     500 mg 250 mL/hr over 60 Minutes Intravenous Every 24 hours 04/16/16 2004 04/17/16 0741   04/17/16 2000  cefTRIAXone (ROCEPHIN) 1 g in dextrose 5 % 50 mL IVPB  Status:  Discontinued     1 g 100 mL/hr over 30 Minutes Intravenous Every 24 hours  04/16/16 2004 04/17/16 0741   04/17/16 1000  oseltamivir (TAMIFLU) capsule 30 mg     30 mg Oral 2 times daily 04/17/16 0645 04/22/16 0959   04/17/16 1000  azithromycin (ZITHROMAX) tablet 500 mg     500 mg Oral Daily 04/17/16 0741     04/16/16 2000  cefTRIAXone (ROCEPHIN) 1 g in dextrose 5 % 50 mL IVPB     1 g 100 mL/hr over 30 Minutes Intravenous  Once 04/16/16 1957 04/16/16 2134   04/16/16 2000  azithromycin (ZITHROMAX) 500 mg in dextrose 5 % 250 mL IVPB     500 mg 250 mL/hr over 60 Minutes Intravenous  Once 04/16/16 1957 04/17/16 2137   04/16/16 2000  oseltamivir (TAMIFLU) capsule 30 mg     30 mg Oral  Once 04/16/16 1957 04/16/16 2040       Subjective: Doing a little bit better today. Off O2 now.    Objective: Vitals:   04/18/16 0548 04/18/16 0942 04/18/16 0949 04/18/16 0953  BP: 113/60     Pulse: 81     Resp: 18     Temp: 98.3 F (36.8 C)     TempSrc: Oral     SpO2: 97% 98% 98% 98%  Weight:      Height:        Intake/Output Summary (Last 24 hours) at 04/18/16 1141 Last data filed at 04/18/16 0900  Gross per 24 hour  Intake             2580 ml  Output  800 ml  Net             1780 ml   Filed Weights   04/16/16 2350  Weight: 53.6 kg (118 lb 2.7 oz)    Examination:  General exam: Appears calm and comfortable Respiratory system: diminished breath sounds, no wheezing this morning. Respiratory effort normal. On room air  Cardiovascular system: S1 & S2 heard, RRR. No JVD, murmurs, rubs, gallops or clicks. No pedal edema. Gastrointestinal system: Abdomen is nondistended, soft and nontender. No organomegaly or masses felt. Normal bowel sounds heard. Central nervous system: Alert and oriented. No focal neurological deficits. Extremities: Symmetric 5 x 5 power. Skin: No rashes, lesions or ulcers Psychiatry: Judgement and insight appear normal. Mood & affect appropriate.   Data Reviewed: I have personally reviewed following labs and imaging  studies  CBC:  Recent Labs Lab 04/16/16 2019 04/17/16 0139 04/18/16 0453  WBC 10.2 8.5 3.2*  NEUTROABS 8.3*  --  2.0  HGB 14.6 11.7* 11.8*  HCT 42.2 34.1* 32.8*  MCV 95.5 97.2 94.3  PLT 248 218 505*   Basic Metabolic Panel:  Recent Labs Lab 04/16/16 2019 04/17/16 0139 04/18/16 0453  NA 139 134* 137  K 3.2* 3.5 3.1*  CL 103 103 104  CO2 '24 23 27  '$ GLUCOSE 156* 142* 103*  BUN 24* 19 13  CREATININE 0.60 0.55 0.43*  CALCIUM 9.2 7.8* 8.0*   GFR: Estimated Creatinine Clearance: 41 mL/min (by C-G formula based on SCr of 0.43 mg/dL (L)). Liver Function Tests:  Recent Labs Lab 04/16/16 2019 04/17/16 0139  AST 30 29  ALT 23 17  ALKPHOS 75 55  BILITOT 1.3* 1.1  PROT 7.1 5.4*  ALBUMIN 4.5 3.3*   No results for input(s): LIPASE, AMYLASE in the last 168 hours. No results for input(s): AMMONIA in the last 168 hours. Coagulation Profile:  Recent Labs Lab 04/16/16 2018 04/17/16 0139 04/18/16 0453  INR 2.10 2.49 1.91   Cardiac Enzymes:  Recent Labs Lab 04/16/16 2304  TROPONINI <0.03   BNP (last 3 results) No results for input(s): PROBNP in the last 8760 hours. HbA1C: No results for input(s): HGBA1C in the last 72 hours. CBG: No results for input(s): GLUCAP in the last 168 hours. Lipid Profile: No results for input(s): CHOL, HDL, LDLCALC, TRIG, CHOLHDL, LDLDIRECT in the last 72 hours. Thyroid Function Tests: No results for input(s): TSH, T4TOTAL, FREET4, T3FREE, THYROIDAB in the last 72 hours. Anemia Panel: No results for input(s): VITAMINB12, FOLATE, FERRITIN, TIBC, IRON, RETICCTPCT in the last 72 hours. Sepsis Labs:  Recent Labs Lab 04/16/16 2032 04/16/16 2302 04/16/16 2304 04/17/16 0139  PROCALCITON  --   --  0.28  --   LATICACIDVEN 2.69* 2.1*  --  1.6    Recent Results (from the past 240 hour(s))  Blood Culture (routine x 2)     Status: None (Preliminary result)   Collection Time: 04/16/16  8:18 PM  Result Value Ref Range Status   Specimen  Description BLOOD LEFT ANTECUBITAL  Final   Special Requests BOTTLES DRAWN AEROBIC AND ANAEROBIC 5CC  Final   Culture   Final    NO GROWTH < 12 HOURS Performed at [facility], [place] [address], [place], [place] [address]    Report Status PENDING  Incomplete  Blood Culture (routine x 2)     Status: None (Preliminary result)   Collection Time: 04/16/16  8:19 PM  Result Value Ref Range Status   Specimen Description BLOOD RIGHT ANTECUBITAL  Final   Special Requests BOTTLES DRAWN AEROBIC AND ANAEROBIC 5CC  Final   Culture   Final    NO GROWTH < 12 HOURS Performed at [facility], [place] [address] [place], [place] [address]    Report Status PENDING  Incomplete  Respiratory Panel by PCR     Status: Abnormal   Collection Time: 04/17/16  1:12 AM  Result Value Ref Range Status   Adenovirus NOT DETECTED NOT DETECTED Final   Coronavirus 229E NOT DETECTED NOT DETECTED Final   Coronavirus HKU1 NOT DETECTED NOT DETECTED Final   Coronavirus NL63 NOT DETECTED NOT DETECTED Final   Coronavirus OC43 NOT DETECTED NOT DETECTED Final   Metapneumovirus NOT DETECTED NOT DETECTED Final   Rhinovirus / Enterovirus NOT DETECTED NOT DETECTED Final   Influenza A H3 DETECTED (A) NOT DETECTED Final   Influenza B NOT DETECTED NOT DETECTED Final   Parainfluenza Virus 1 NOT DETECTED NOT DETECTED Final   Parainfluenza Virus 2 NOT DETECTED NOT DETECTED Final   Parainfluenza Virus 3 NOT DETECTED NOT DETECTED Final   Parainfluenza Virus 4 NOT DETECTED NOT DETECTED Final   Respiratory Syncytial Virus NOT DETECTED NOT DETECTED Final   Bordetella pertussis NOT DETECTED NOT DETECTED Final   Chlamydophila pneumoniae NOT DETECTED NOT DETECTED Final   Mycoplasma pneumoniae NOT DETECTED NOT DETECTED Final    Comment: Performed at [facility], [address] [place], [place] [address]  Urine culture     Status: None   Collection Time: 04/17/16 10:25 AM  Result Value Ref Range Status   Specimen Description  URINE, CLEAN CATCH  Final   Special Requests NONE  Final   Culture   Final    NO GROWTH Performed at [facility], [place] [address], [place], [place] [address]    Report Status 04/18/2016 FINAL  Final  Culture, sputum-assessment     Status: None   Collection Time: 04/17/16  3:00 PM  Result Value Ref Range Status   Specimen Description SPUTUM  Final   Special Requests NONE  Final   Sputum evaluation THIS SPECIMEN IS ACCEPTABLE FOR SPUTUM CULTURE  Final   Report Status 04/17/2016 FINAL  Final  Culture, respiratory (NON-Expectorated)     Status: None (Preliminary result)   Collection Time: 04/17/16  3:00 PM  Result Value Ref Range Status   Specimen Description SPUTUM  Final   Special Requests NONE Reflexed from [id number]  Final   Gram Stain   Final    MODERATE WBC PRESENT,BOTH PMN AND MONONUCLEAR FEW SQUAMOUS EPITHELIAL CELLS PRESENT FEW GRAM POSITIVE COCCI IN PAIRS FEW GRAM NEGATIVE RODS FEW GRAM POSITIVE RODS RARE BUDDING YEAST SEEN    Culture   Final    TOO YOUNG TO READ Performed at [facility], [place] [address], [place], [place] [address]    Report Status PENDING  Incomplete       Radiology Studies: Dg Chest 2 View  Result Date: 04/16/2016 CLINICAL DATA:  Shortness of breath and nausea for 2 days. EXAM: CHEST  2 VIEW COMPARISON:  02/20/2014 FINDINGS: Lungs are hyperexpanded. Interstitial markings are diffusely coarsened with chronic features. No edema or focal airspace consolidation. No pleural effusion. The cardiopericardial silhouette is within normal limits for size. Bones are diffusely demineralized. Mid thoracic compression fracture again noted. IMPRESSION: Emphysema without acute cardiopulmonary findings. Electronically Signed   By: Misty Stanley M.D.   On: 04/16/2016 20:18      Scheduled Meds: . amitriptyline  25 mg Oral QHS  . arformoterol  15 mcg Nebulization BID  . azithromycin  500 mg Oral Daily  . budesonide (PULMICORT) nebulizer solution  0.5 mg  Nebulization BID  . diltiazem  180 mg Oral Daily  . guaiFENesin  600 mg Oral BID  . ipratropium-albuterol  3 mL Nebulization TID  . levothyroxine  137 mcg Oral QAC breakfast  . losartan  100 mg Oral Daily  . naphazoline-glycerin  1-2 drop Both Eyes Daily  . oseltamivir  30 mg Oral BID  . potassium chloride  40 mEq Oral Q4H  . predniSONE  40 mg Oral Q breakfast  . warfarin  3.75 mg Oral ONCE-1800  . Warfarin - Pharmacist Dosing Inpatient   Does not apply q1800   Continuous Infusions:    LOS: 2 days    Time spent: 30 minutes   Dessa Phi, DO [facility] [email or web address] Password [email or web address] 04/18/2016, 11:41 AM

## 2016-04-19 LAB — BASIC METABOLIC PANEL
Anion gap: 5 (ref 5–15)
BUN: 15 mg/dL (ref 6–20)
CHLORIDE: 106 mmol/L (ref 101–111)
CO2: 27 mmol/L (ref 22–32)
CREATININE: 0.51 mg/dL (ref 0.44–1.00)
Calcium: 8.6 mg/dL — ABNORMAL LOW (ref 8.9–10.3)
GFR calc Af Amer: >60 mL/min (ref >60)
GFR calc non Af Amer: >60 mL/min (ref >60)
Glucose, Bld: 103 mg/dL — ABNORMAL HIGH (ref 65–99)
POTASSIUM: 3.7 mmol/L (ref 3.5–5.1)
SODIUM: 138 mmol/L (ref 135–145)

## 2016-04-19 LAB — PROTIME-INR
INR: 1.85
Prothrombin Time: 21.6 seconds — ABNORMAL HIGH (ref 11.4–15.2)

## 2016-04-19 LAB — CBC WITH DIFFERENTIAL/PLATELET
Basophils Absolute: 0 10*3/uL (ref 0.0–0.1)
Basophils Relative: 0 %
Eosinophils Absolute: 0 10*3/uL (ref 0.0–0.7)
Eosinophils Relative: 0 %
HEMATOCRIT: 37.4 % (ref 36.0–46.0)
HEMOGLOBIN: 12.6 g/dL (ref 12.0–15.0)
LYMPHS ABS: 1.3 10*3/uL (ref 0.7–4.0)
Lymphocytes Relative: 26 %
MCH: 32.6 pg (ref 26.0–34.0)
MCHC: 33.7 g/dL (ref 30.0–36.0)
MCV: 96.9 fL (ref 78.0–100.0)
MONOS PCT: 10 %
Monocytes Absolute: 0.5 10*3/uL (ref 0.1–1.0)
NEUTROS ABS: 3.2 10*3/uL (ref 1.7–7.7)
NEUTROS PCT: 64 %
Platelets: 232 10*3/uL (ref 150–400)
RBC: 3.86 MIL/uL — ABNORMAL LOW (ref 3.87–5.11)
RDW: 13.8 % (ref 11.5–15.5)
WBC: 5 10*3/uL (ref 4.0–10.5)

## 2016-04-19 LAB — CULTURE, RESPIRATORY W GRAM STAIN: Culture: NORMAL

## 2016-04-19 LAB — CULTURE, RESPIRATORY

## 2016-04-19 MED ORDER — WARFARIN SODIUM 2.5 MG PO TABS: 3.7500 mg | ORAL_TABLET | Freq: Once | ORAL | Status: DC

## 2016-04-19 MED ORDER — FLUTICASONE PROPIONATE 50 MCG/ACT NA SUSP
2.0000 | NASAL | Status: DC | PRN
  Administered 2016-04-19: 2 via NASAL
  Filled 2016-04-19 (×2): qty 16

## 2016-04-19 MED ORDER — AZITHROMYCIN 500 MG PO TABS: 500.0000 mg | ORAL_TABLET | Freq: Every day | ORAL | 0 refills | Status: AC

## 2016-04-19 MED ORDER — PROMETHAZINE-CODEINE 6.25-10 MG/5ML PO SYRP: 5.0000 mL | ORAL_SOLUTION | Freq: Four times a day (QID) | ORAL | 0 refills | Status: DC | PRN

## 2016-04-19 MED ORDER — PREDNISONE 10 MG PO TABS: ORAL_TABLET | ORAL | 0 refills | Status: DC

## 2016-04-19 MED ORDER — OSELTAMIVIR PHOSPHATE 30 MG PO CAPS: 30.0000 mg | ORAL_CAPSULE | Freq: Two times a day (BID) | ORAL | 0 refills | Status: AC

## 2016-04-19 MED ORDER — PROMETHAZINE-CODEINE 6.25-10 MG/5ML PO SYRP
5.0000 mL | ORAL_SOLUTION | Freq: Four times a day (QID) | ORAL | Status: DC | PRN
  Administered 2016-04-19: 5 mL via ORAL
  Filled 2016-04-19 (×2): qty 5

## 2016-04-19 NOTE — Discharge Summary (Signed)
Physician Discharge Summary  Heather [name]:[id number] DOB: 08-21-1926 DOA: 04/16/2016  PCP: Geoffery Lyons, MD  Admit date: 04/16/2016 Discharge date: 04/19/2016  Admitted From: Home Disposition:  Home  Recommendations for Outpatient Follow-up:  1. Follow up with PCP in 1-2 weeks 2. Follow up with Coumadin Clinic next week  3. Please follow up on the following pending results: final blood cultures results, final respiratory culture results  Home Health: no  Equipment/Devices: none   Discharge Condition: stable CODE STATUS: full  Diet recommendation: heart healthy   Brief/Interim Summary: Heather [name] is a 83 y.o.femalewith medical history significant of COPD, afib on anticoagulation, hypothyroidism;who presents with 2 days of productive cough and shortness of breath. At baseline she is not on oxygen at home. She reports developing a productive cough with yellowish sputum. Associated symptoms include wheezing, nausea, fever up to 101.44F at home, malaise, and sore throat. She was diagnosed with influenza as well as acute respiratory failure. She was treated with tamiflu, azithromycin, prednisone as well. Her respiratory status continued to improve and was weaned off [place] O2 during hospitalization. Of note, her husband has also been admitted to the hospital with influenza and CAP.   Subjective on day of discharge: feeling better, but still coughing white sputum, nasal congestion. No complaints of shortness of breath or chest pain. She has been afebrile.   Discharge Diagnoses:  Principal Problem:   Sepsis ([place]) Active Problems:   PAF (paroxysmal atrial fibrillation) ([place])   Long term current use of anticoagulant therapy   HTN (hypertension)   COPD (chronic obstructive pulmonary disease) (H[facility])   Influenza-like illness   Hypokalemia   Hypothyroidism  Sepsis 2/2 influenza  -Continue tamiflu, total 5 days  Acute respiratory failure with hypoxia secondary to influenza,  COPD exacerbation -Off O2 now -Nebs -Continue azithromycin, total 5 days -Prednisone taper  Paroxysmal atrial fibrillation on chronic anticoagulation -Chadsvasc= 4 -Continue diltiazem and Coumadin per pharmacy -Follow up with coumadin clinic for INR re-check   Essential hypertension -Continue Cozaar  Hypothyroidism -Continue levothyroxine  Discharge Instructions  Discharge Instructions    Call MD for:  difficulty breathing, headache or visual disturbances    Complete by:  As directed    Call MD for:  temperature >100.4    Complete by:  As directed    Diet - low sodium heart healthy    Complete by:  As directed    Discharge instructions    Complete by:  As directed    Please follow up with your coumadin clinic for INR check this week   Increase activity slowly    Complete by:  As directed      Allergies as of 04/19/2016      Reactions   Ace Inhibitors Cough   Sulfur Nausea And Vomiting      Medication List    TAKE these medications   albuterol 108 (90 Base) MCG/ACT inhaler Commonly known as:  PROVENTIL HFA;VENTOLIN HFA Inhale 2 puffs into the lungs every 6 (six) hours as needed for wheezing or shortness of breath.   amitriptyline 25 MG tablet Commonly known as:  ELAVIL Take 25 mg by mouth at bedtime.   azithromycin 500 MG tablet Commonly known as:  ZITHROMAX Take 1 tablet (500 mg total) by mouth daily.   calcium citrate-vitamin D 315-200 MG-UNIT tablet Commonly known as:  CITRACAL+D Take 1 tablet by mouth daily.   diltiazem 180 MG 24 hr capsule Commonly known as:  CARDIZEM CD Take 180 mg by mouth  daily.   hydrochlorothiazide 25 MG tablet Commonly known as:  HYDRODIURIL Take 1 tablet (25 mg total) by mouth daily.   levothyroxine 137 MCG tablet Commonly known as:  SYNTHROID, LEVOTHROID Take 137 mcg by mouth daily before breakfast.   losartan 100 MG tablet Commonly known as:  COZAAR TAKE 1 TABLET BY MOUTH EVERY DAY KEEP OFFICE VISIT    multivitamin with minerals tablet Take 1 tablet by mouth daily.   naphazoline-glycerin 0.012-0.2 % Soln Commonly known as:  CLEAR EYES Place 1-2 drops into both eyes daily.   oseltamivir 30 MG capsule Commonly known as:  TAMIFLU Take 1 capsule (30 mg total) by mouth 2 (two) times daily.   polyethylene glycol packet Commonly known as:  MIRALAX / GLYCOLAX Take 17 g by mouth daily as needed for moderate constipation or severe constipation.   predniSONE 10 MG tablet Commonly known as:  DELTASONE Take 4 tabs for 3 days, then 3 tabs for 3 days, then 2 tabs for 3 days, then 1 tab for 3 days, then 1/2 tab for 4 days.   promethazine-codeine 6.25-10 MG/5ML syrup Commonly known as:  PHENERGAN with CODEINE Take 5 mLs by mouth every 6 (six) hours as needed for cough.   SYMBICORT IN Inhale into the lungs 2 (two) times daily.   warfarin 2.5 MG tablet Commonly known as:  COUMADIN TAKE ONE TO ONE & ONE-HALF TABLETS BY MOUTH ONCE DAILY AS DIRECTED What changed:  additional instructions      Follow-up Information    ARONSON,RICHARD A, MD. Schedule an appointment as soon as possible for a visit in 1 week(s).   Specialty:  Internal Medicine Contact information: [place] [address] [phone]          Allergies  Allergen Reactions  . Ace Inhibitors Cough  . Sulfur Nausea And Vomiting    Consultations:  None   Procedures/Studies: Dg Chest 2 View  Result Date: 04/16/2016 CLINICAL DATA:  Shortness of breath and nausea for 2 days. EXAM: CHEST  2 VIEW COMPARISON:  02/20/2014 FINDINGS: Lungs are hyperexpanded. Interstitial markings are diffusely coarsened with chronic features. No edema or focal airspace consolidation. No pleural effusion. The cardiopericardial silhouette is within normal limits for size. Bones are diffusely demineralized. Mid thoracic compression fracture again noted. IMPRESSION: Emphysema without acute cardiopulmonary findings. Electronically  Signed   By: Misty Stanley M.D.   On: 04/16/2016 20:18       Discharge Exam: Vitals:   04/18/16 2051 04/19/16 0651  BP: 133/71 (!) 144/79  Pulse: 85 72  Resp: 19 18  Temp: 98.1 F (36.7 C) 97.6 F (36.4 C)   Vitals:   04/18/16 1411 04/18/16 2049 04/18/16 2051 04/19/16 0651  BP:   133/71 (!) 144/79  Pulse:   85 72  Resp:   19 18  Temp:   98.1 F (36.7 C) 97.6 F (36.4 C)  TempSrc:   Oral Oral  SpO2: 94% 95% 95% 96%  Weight:      Height:        General: Pt is alert, awake, not in acute distress Cardiovascular: RRR, S1/S2 +, no rubs, no gallops Respiratory: CTA bilaterally, no wheezing, no rhonchi, no conversational dyspnea  Abdominal: Soft, NT, ND, bowel sounds + Extremities: no edema, no cyanosis    The results of significant diagnostics from this hospitalization (including imaging, microbiology, ancillary and laboratory) are listed below for reference.     Microbiology: Recent Results (from the past 240 hour(s))  Blood Culture (routine  x 2)     Status: None (Preliminary result)   Collection Time: 04/16/16  8:18 PM  Result Value Ref Range Status   Specimen Description BLOOD LEFT ANTECUBITAL  Final   Special Requests BOTTLES DRAWN AEROBIC AND ANAEROBIC 5CC  Final   Culture   Final    NO GROWTH 1 DAY Performed at [facility], [place] [address], [place], [place] [address]    Report Status PENDING  Incomplete  Blood Culture (routine x 2)     Status: None (Preliminary result)   Collection Time: 04/16/16  8:19 PM  Result Value Ref Range Status   Specimen Description BLOOD RIGHT ANTECUBITAL  Final   Special Requests BOTTLES DRAWN AEROBIC AND ANAEROBIC 5CC  Final   Culture   Final    NO GROWTH 1 DAY Performed at [facility], [place] [address] [place], [place] [address]    Report Status PENDING  Incomplete  Respiratory Panel by PCR     Status: Abnormal   Collection Time: 04/17/16  1:12 AM  Result Value Ref Range Status   Adenovirus NOT DETECTED NOT  DETECTED Final   Coronavirus 229E NOT DETECTED NOT DETECTED Final   Coronavirus HKU1 NOT DETECTED NOT DETECTED Final   Coronavirus NL63 NOT DETECTED NOT DETECTED Final   Coronavirus OC43 NOT DETECTED NOT DETECTED Final   Metapneumovirus NOT DETECTED NOT DETECTED Final   Rhinovirus / Enterovirus NOT DETECTED NOT DETECTED Final   Influenza A H3 DETECTED (A) NOT DETECTED Final   Influenza B NOT DETECTED NOT DETECTED Final   Parainfluenza Virus 1 NOT DETECTED NOT DETECTED Final   Parainfluenza Virus 2 NOT DETECTED NOT DETECTED Final   Parainfluenza Virus 3 NOT DETECTED NOT DETECTED Final   Parainfluenza Virus 4 NOT DETECTED NOT DETECTED Final   Respiratory Syncytial Virus NOT DETECTED NOT DETECTED Final   Bordetella pertussis NOT DETECTED NOT DETECTED Final   Chlamydophila pneumoniae NOT DETECTED NOT DETECTED Final   Mycoplasma pneumoniae NOT DETECTED NOT DETECTED Final    Comment: Performed at [facility], [address] [place], [place] [address]  Urine culture     Status: None   Collection Time: 04/17/16 10:25 AM  Result Value Ref Range Status   Specimen Description URINE, CLEAN CATCH  Final   Special Requests NONE  Final   Culture   Final    NO GROWTH Performed at [facility], [place] [address] [place], [place] [address]    Report Status 04/18/2016 FINAL  Final  Culture, sputum-assessment     Status: None   Collection Time: 04/17/16  3:00 PM  Result Value Ref Range Status   Specimen Description SPUTUM  Final   Special Requests NONE  Final   Sputum evaluation THIS SPECIMEN IS ACCEPTABLE FOR SPUTUM CULTURE  Final   Report Status 04/17/2016 FINAL  Final  Culture, respiratory (NON-Expectorated)     Status: None (Preliminary result)   Collection Time: 04/17/16  3:00 PM  Result Value Ref Range Status   Specimen Description SPUTUM  Final   Special Requests NONE Reflexed from [id number]  Final   Gram Stain   Final    MODERATE WBC PRESENT,BOTH PMN AND MONONUCLEAR FEW  SQUAMOUS EPITHELIAL CELLS PRESENT FEW GRAM POSITIVE COCCI IN PAIRS FEW GRAM NEGATIVE RODS FEW GRAM POSITIVE RODS RARE BUDDING YEAST SEEN    Culture   Final    TOO YOUNG TO READ Performed at [facility], [place],  [place] [address]    Report Status PENDING  Incomplete     Labs: BNP (last 3 results) No results for input(s): BNP in the last 8760 hours. Basic Metabolic Panel:  Recent Labs Lab 04/16/16 2019 04/17/16 0139 04/18/16 0453 04/19/16 0508  NA 139 134* 137 138  K 3.2* 3.5 3.1* 3.7  CL 103 103 104 106  CO2 '24 23 27 27  '$ GLUCOSE 156* 142* 103* 103*  BUN 24* '19 13 15  '$ CREATININE 0.60 0.55 0.43* 0.51  CALCIUM 9.2 7.8* 8.0* 8.6*   Liver Function Tests:  Recent Labs Lab 04/16/16 2019 04/17/16 0139  AST 30 29  ALT 23 17  ALKPHOS 75 55  BILITOT 1.3* 1.1  PROT 7.1 5.4*  ALBUMIN 4.5 3.3*   No results for input(s): LIPASE, AMYLASE in the last 168 hours. No results for input(s): AMMONIA in the last 168 hours. CBC:  Recent Labs Lab 04/16/16 2019 04/17/16 0139 04/18/16 0453 04/19/16 0508  WBC 10.2 8.5 3.2* 5.0  NEUTROABS 8.3*  --  2.0 3.2  HGB 14.6 11.7* 11.8* 12.6  HCT 42.2 34.1* 32.8* 37.4  MCV 95.5 97.2 94.3 96.9  PLT 248 218 138* 232   Cardiac Enzymes:  Recent Labs Lab 04/16/16 2304  TROPONINI <0.03   BNP: Invalid input(s): POCBNP CBG: No results for input(s): GLUCAP in the last 168 hours. D-Dimer No results for input(s): DDIMER in the last 72 hours. Hgb A1c No results for input(s): HGBA1C in the last 72 hours. Lipid Profile No results for input(s): CHOL, HDL, LDLCALC, TRIG, CHOLHDL, LDLDIRECT in the last 72 hours. Thyroid function studies No results for input(s): TSH, T4TOTAL, T3FREE, THYROIDAB in the last 72 hours.  Invalid input(s): FREET3 Anemia work up No results for input(s): VITAMINB12, FOLATE, FERRITIN, TIBC, IRON, RETICCTPCT in the last 72 hours. Urinalysis    Component Value Date/Time   COLORURINE YELLOW  03/09/2015 1500   APPEARANCEUR CLOUDY (A) 03/09/2015 1500   LABSPEC 1.011 03/09/2015 1500   PHURINE 7.5 03/09/2015 1500   GLUCOSEU NEGATIVE 03/09/2015 1500   HGBUR NEGATIVE 03/09/2015 1500   BILIRUBINUR NEGATIVE 03/09/2015 1500   KETONESUR NEGATIVE 03/09/2015 1500   PROTEINUR NEGATIVE 03/09/2015 1500   UROBILINOGEN 0.2 05/17/2013 1207   NITRITE POSITIVE (A) 03/09/2015 1500   LEUKOCYTESUR MODERATE (A) 03/09/2015 1500   Sepsis Labs Invalid input(s): PROCALCITONIN,  WBC,  LACTICIDVEN Microbiology Recent Results (from the past 240 hour(s))  Blood Culture (routine x 2)     Status: None (Preliminary result)   Collection Time: 04/16/16  8:18 PM  Result Value Ref Range Status   Specimen Description BLOOD LEFT ANTECUBITAL  Final   Special Requests BOTTLES DRAWN AEROBIC AND ANAEROBIC 5CC  Final   Culture   Final    NO GROWTH 1 DAY Performed at [facility], [place] [address], [place], [place] [address]    Report Status PENDING  Incomplete  Blood Culture (routine x 2)     Status: None (Preliminary result)   Collection Time: 04/16/16  8:19 PM  Result Value Ref Range Status   Specimen Description BLOOD RIGHT ANTECUBITAL  Final   Special Requests BOTTLES DRAWN AEROBIC AND ANAEROBIC 5CC  Final   Culture   Final    NO GROWTH 1 DAY Performed at [facility], [place] [address] [place], [place] [address]    Report Status PENDING  Incomplete  Respiratory Panel by PCR     Status: Abnormal   Collection Time: 04/17/16  1:12 AM  Result  Value Ref Range Status   Adenovirus NOT DETECTED NOT DETECTED Final   Coronavirus 229E NOT DETECTED NOT DETECTED Final   Coronavirus HKU1 NOT DETECTED NOT DETECTED Final   Coronavirus NL63 NOT DETECTED NOT DETECTED Final   Coronavirus OC43 NOT DETECTED NOT DETECTED Final   Metapneumovirus NOT DETECTED NOT DETECTED Final   Rhinovirus / Enterovirus NOT DETECTED NOT DETECTED Final   Influenza A H3 DETECTED (A) NOT DETECTED Final   Influenza B NOT DETECTED  NOT DETECTED Final   Parainfluenza Virus 1 NOT DETECTED NOT DETECTED Final   Parainfluenza Virus 2 NOT DETECTED NOT DETECTED Final   Parainfluenza Virus 3 NOT DETECTED NOT DETECTED Final   Parainfluenza Virus 4 NOT DETECTED NOT DETECTED Final   Respiratory Syncytial Virus NOT DETECTED NOT DETECTED Final   Bordetella pertussis NOT DETECTED NOT DETECTED Final   Chlamydophila pneumoniae NOT DETECTED NOT DETECTED Final   Mycoplasma pneumoniae NOT DETECTED NOT DETECTED Final    Comment: Performed at [facility], [address], [place], [place] [address]  Urine culture     Status: None   Collection Time: 04/17/16 10:25 AM  Result Value Ref Range Status   Specimen Description URINE, CLEAN CATCH  Final   Special Requests NONE  Final   Culture   Final    NO GROWTH Performed at [facility], [place] [address], [place], [place] [address]    Report Status 04/18/2016 FINAL  Final  Culture, sputum-assessment     Status: None   Collection Time: 04/17/16  3:00 PM  Result Value Ref Range Status   Specimen Description SPUTUM  Final   Special Requests NONE  Final   Sputum evaluation THIS SPECIMEN IS ACCEPTABLE FOR SPUTUM CULTURE  Final   Report Status 04/17/2016 FINAL  Final  Culture, respiratory (NON-Expectorated)     Status: None (Preliminary result)   Collection Time: 04/17/16  3:00 PM  Result Value Ref Range Status   Specimen Description SPUTUM  Final   Special Requests NONE Reflexed from [id number]  Final   Gram Stain   Final    MODERATE WBC PRESENT,BOTH PMN AND MONONUCLEAR FEW SQUAMOUS EPITHELIAL CELLS PRESENT FEW GRAM POSITIVE COCCI IN PAIRS FEW GRAM NEGATIVE RODS FEW GRAM POSITIVE RODS RARE BUDDING YEAST SEEN    Culture   Final    TOO YOUNG TO READ Performed at [facility], [place] [address], [place],  [address]    Report Status PENDING  Incomplete     Time coordinating discharge: Over 30 minutes  SIGNED:  Dessa Phi, DO [facility] Pager  [phone]  If 7PM-7AM, please contact night-coverage [email or web address] Password [email or web address] 04/19/2016, 8:26 AM

## 2016-04-19 NOTE — Care Management Important Message (Signed)
Important Message  Patient Details  Name: Heather [name] MRN: [id number] Date of Birth: [birth year]   Medicare Important Message Given:  Yes    Erenest Rasher, RN 04/19/2016, 2:15 PM

## 2016-04-19 NOTE — Progress Notes (Signed)
ANTICOAGULATION CONSULT NOTE - Follow Up Consult  Pharmacy Consult for Warfarin Indication: atrial fibrillation  Allergies  Allergen Reactions  . Ace Inhibitors Cough  . Sulfur Nausea And Vomiting    Patient Measurements: Height: '5\' 3"'$  (160 cm) Weight: 118 lb 2.7 oz (53.6 kg) IBW/kg (Calculated) : 52.4  Vital Signs: Temp: 97.6 F (36.4 C) (01/20 0651) Temp Source: Oral (01/20 0651) BP: 144/79 (01/20 0651) Pulse Rate: 72 (01/20 0651)  Labs:  Recent Labs  04/16/16 2304 04/17/16 0139 04/18/16 0453 04/19/16 0508  HGB  --  11.7* 11.8* 12.6  HCT  --  34.1* 32.8* 37.4  PLT  --  218 138* 232  LABPROT  --  27.4* 22.2* 21.6*  INR  --  2.49 1.91 1.85  CREATININE  --  0.55 0.43* 0.51  TROPONINI <0.03  --   --   --     Estimated Creatinine Clearance: 41 mL/min (by C-G formula based on SCr of 0.51 mg/dL).   Medications:  Scheduled:  . amitriptyline  25 mg Oral QHS  . arformoterol  15 mcg Nebulization BID  . azithromycin  500 mg Oral Daily  . budesonide (PULMICORT) nebulizer solution  0.5 mg Nebulization BID  . diltiazem  180 mg Oral Daily  . guaiFENesin  600 mg Oral BID  . ipratropium-albuterol  3 mL Nebulization TID  . levothyroxine  137 mcg Oral QAC breakfast  . losartan  100 mg Oral Daily  . naphazoline-glycerin  1-2 drop Both Eyes Daily  . oseltamivir  30 mg Oral BID  . predniSONE  40 mg Oral Q breakfast  . Warfarin - Pharmacist Dosing Inpatient   Does not apply q1800    Assessment: 83 y.o F presented to the ED on 04/16/16 with c/o cough and SOB, started on antibiotics and Tamiflu.  PMH includes atrial fibrillation on chronic warfarin anticoagulation.  Pharmacy is consulted for warfarin dosing.    Home dose warfarin 2.5 mg daily except 3.75 mg on Tuesdays. Admission INR 2.1 is therapeutic  Today, 04/19/2016:  INR continues to trend down with 1.85 today with higher dose given yesterday  CBC: Hgb low but stable; plt 232 (suspects low plt value on 1/19 was lab  error)  No bleeding documented  Drug-drug interaction: Azithromycin may increase INR  Goal of Therapy:  INR 2-3 Monitor platelets by anticoagulation protocol: Yes   Plan:   Warfarin 3.75 mg PO x1 today at 1800 if patient doesn't get discharged  Daily PT/INR.  Monitor cbc closely  Monitor for signs and symptoms of bleeding.  Dia Sitter, PharmD, BCPS 04/19/2016 10:37 AM

## 2016-04-19 NOTE — Care Management Note (Signed)
Case Management Note  Patient Details  Name: NAO LINZ MRN: [id number] Date of Birth: 26-May-1926  Subjective/Objective:     Influenza                Action/Plan: Discharge Planning: AVS reviewed:  NCM spoke to pt and lives at home with husband. Husband is going to SNF rehab. Pt states her grand-dtr, Rylea will be assisting her at home.   PCP Burnard Bunting MD  Expected Discharge Date:  04/19/16               Expected Discharge Plan:  Home/Self Care  In-House Referral:  NA  Discharge planning Services  CM Consult  Post Acute Care Choice:  NA Choice offered to:  NA  DME Arranged:  N/A DME Agency:  NA  HH Arranged:  NA HH Agency:  NA  Status of Service:  Completed, signed off  If discussed at [place] of Stay Meetings, dates discussed:    Additional Comments:  Erenest Rasher, RN 04/19/2016, 2:28 PM

## 2016-04-19 NOTE — Evaluation (Signed)
Physical Therapy Evaluation Patient Details Name: Heather [name] MRN: [id number] DOB: 02-Dec-1926 Today's Date: 04/19/2016   History of Present Illness  admittedd with Flu.   Clinical Impression  The patient is mobilizing in room, uses a 4 wheeled RW. No further PT recommended. patient has discharge orders.    Follow Up Recommendations No PT follow up    Equipment Recommendations  None recommended by PT    Recommendations for Other Services       Precautions / Restrictions Precautions Precautions: Fall      Mobility  Bed Mobility Overal bed mobility: Independent                Transfers Overall transfer level: Modified independent Equipment used: Rolling walker (2 wheeled)                Ambulation/Gait Ambulation/Gait assistance: Supervision Ambulation Distance (Feet): 40 Feet Assistive device: Rolling walker (2 wheeled) Gait Pattern/deviations: Step-to pattern;Step-through pattern     General Gait Details: gait is steady with RW  Stairs            Wheelchair Mobility    Modified Rankin (Stroke Patients Only)       Balance                                             Pertinent Vitals/Pain Pain Assessment: No/denies pain    Home Living Family/patient expects to be discharged to:: Private residence Living Arrangements: Other relatives (spouse in [place]-going to SNF) Available Help at Discharge: Family Type of Home: Apartment Home Access: Level entry     Home Layout: One level Home Equipment: Environmental consultant - 4 wheels      Prior Function Level of Independence: Independent with assistive device(s)               Hand Dominance        Extremity/Trunk Assessment   Upper Extremity Assessment Upper Extremity Assessment: Overall WFL for tasks assessed    Lower Extremity Assessment Lower Extremity Assessment: Overall WFL for tasks assessed    Cervical / Trunk Assessment Cervical / Trunk Assessment: Kyphotic   Communication   Communication: No difficulties  Cognition Arousal/Alertness: Awake/alert Behavior During Therapy: WFL for tasks assessed/performed Overall Cognitive Status: Within Functional Limits for tasks assessed                      General Comments      Exercises     Assessment/Plan    PT Assessment Patent does not need any further PT services  PT Problem List            PT Treatment Interventions      PT Goals (Current goals can be found in the Care Plan section)  Acute Rehab PT Goals Patient Stated Goal: go home PT Goal Formulation: All assessment and education complete, DC therapy    Frequency     Barriers to discharge        Co-evaluation               End of Session   Activity Tolerance: Patient tolerated treatment well Patient left: in bed;with call bell/phone within reach Nurse Communication: Mobility status         Time: 0865-7846 PT Time Calculation (min) (ACUTE ONLY): 19 min   Charges:   PT Evaluation $PT Eval Low Complexity: 1 Procedure  PT G Codes:        Claretha Cooper 04/19/2016, 12:53 PM

## 2016-04-19 NOTE — Progress Notes (Signed)
OT Cancellation Note  Patient Details Name: Heather [name] MRN: [id number] DOB: [birth year]   Cancelled Treatment:    Reason Eval/Treat Not Completed: OT screened, no needs identified, will sign off  [name] 04/19/2016, 1:00 PM  [phone]

## 2016-04-22 LAB — CULTURE, BLOOD (ROUTINE X 2)
CULTURE: NO GROWTH
CULTURE: NO GROWTH

## 2016-05-12 ENCOUNTER — Telehealth: Payer: Self-pay | Admitting: Pharmacist

## 2016-05-12 NOTE — Telephone Encounter (Signed)
Patient called clinic requesting conversion from warfarin to Eliquis.    Next INR appointment scheduled for 05/19/16. Will provide written instruringction for proper conversion during clinic f/u.  Noted: 83yo Scr = 0.53 (BMET 04/19/16) H/H = within normal limits Plts = 232   Recommended : Eliquis 2.'5mg'$  BID

## 2016-05-19 ENCOUNTER — Ambulatory Visit (INDEPENDENT_AMBULATORY_CARE_PROVIDER_SITE_OTHER): Payer: Medicare HMO | Admitting: Pharmacist Clinician (PhC)/ Clinical Pharmacy Specialist

## 2016-05-19 ENCOUNTER — Other Ambulatory Visit: Payer: Self-pay | Admitting: Pharmacist Clinician (PhC)/ Clinical Pharmacy Specialist

## 2016-05-19 DIAGNOSIS — I480 Paroxysmal atrial fibrillation: Secondary | ICD-10-CM | POA: Diagnosis not present

## 2016-05-19 DIAGNOSIS — I4891 Unspecified atrial fibrillation: Secondary | ICD-10-CM | POA: Diagnosis not present

## 2016-05-19 DIAGNOSIS — Z7901 Long term (current) use of anticoagulants: Secondary | ICD-10-CM

## 2016-05-19 LAB — POCT INR: INR: 1.7

## 2016-05-19 MED ORDER — APIXABAN 2.5 MG PO TABS: 2.5000 mg | ORAL_TABLET | Freq: Two times a day (BID) | ORAL | Status: DC

## 2016-05-19 MED ORDER — APIXABAN 2.5 MG PO TABS: 2.5000 mg | ORAL_TABLET | Freq: Two times a day (BID) | ORAL | 5 refills | Status: DC

## 2016-05-29 DIAGNOSIS — J449 Chronic obstructive pulmonary disease, unspecified: Secondary | ICD-10-CM | POA: Diagnosis not present

## 2016-05-29 DIAGNOSIS — E860 Dehydration: Secondary | ICD-10-CM | POA: Diagnosis not present

## 2016-05-29 DIAGNOSIS — I10 Essential (primary) hypertension: Secondary | ICD-10-CM | POA: Diagnosis not present

## 2016-05-29 DIAGNOSIS — R634 Abnormal weight loss: Secondary | ICD-10-CM | POA: Diagnosis not present

## 2016-05-29 DIAGNOSIS — K5900 Constipation, unspecified: Secondary | ICD-10-CM | POA: Diagnosis not present

## 2016-05-29 DIAGNOSIS — I951 Orthostatic hypotension: Secondary | ICD-10-CM | POA: Diagnosis not present

## 2016-05-29 DIAGNOSIS — R110 Nausea: Secondary | ICD-10-CM | POA: Diagnosis not present

## 2016-05-29 DIAGNOSIS — E038 Other specified hypothyroidism: Secondary | ICD-10-CM | POA: Diagnosis not present

## 2016-06-04 DIAGNOSIS — I951 Orthostatic hypotension: Secondary | ICD-10-CM | POA: Diagnosis not present

## 2016-06-04 DIAGNOSIS — M810 Age-related osteoporosis without current pathological fracture: Secondary | ICD-10-CM | POA: Diagnosis not present

## 2016-06-17 ENCOUNTER — Ambulatory Visit (HOSPITAL_COMMUNITY): Admission: RE | Admit: 2016-06-17 | Payer: Medicare HMO | Source: Ambulatory Visit

## 2016-06-23 ENCOUNTER — Encounter (HOSPITAL_COMMUNITY): Payer: Self-pay | Admitting: Emergency Medicine

## 2016-06-23 ENCOUNTER — Emergency Department (HOSPITAL_COMMUNITY)
Admission: EM | Admit: 2016-06-23 | Discharge: 2016-06-23 | Disposition: A | Discharge status: Home / Self Care | Payer: Medicare HMO | Attending: Emergency Medicine | Admitting: Emergency Medicine

## 2016-06-23 ENCOUNTER — Emergency Department (HOSPITAL_COMMUNITY): Payer: Medicare HMO

## 2016-06-23 DIAGNOSIS — Y929 Unspecified place or not applicable: Secondary | ICD-10-CM | POA: Diagnosis not present

## 2016-06-23 DIAGNOSIS — R109 Unspecified abdominal pain: Secondary | ICD-10-CM | POA: Insufficient documentation

## 2016-06-23 DIAGNOSIS — S22000A Wedge compression fracture of unspecified thoracic vertebra, initial encounter for closed fracture: Secondary | ICD-10-CM

## 2016-06-23 DIAGNOSIS — X58XXXA Exposure to other specified factors, initial encounter: Secondary | ICD-10-CM | POA: Insufficient documentation

## 2016-06-23 DIAGNOSIS — Z96642 Presence of left artificial hip joint: Secondary | ICD-10-CM | POA: Insufficient documentation

## 2016-06-23 DIAGNOSIS — E039 Hypothyroidism, unspecified: Secondary | ICD-10-CM | POA: Insufficient documentation

## 2016-06-23 DIAGNOSIS — I10 Essential (primary) hypertension: Secondary | ICD-10-CM | POA: Insufficient documentation

## 2016-06-23 DIAGNOSIS — Z7901 Long term (current) use of anticoagulants: Secondary | ICD-10-CM | POA: Diagnosis not present

## 2016-06-23 DIAGNOSIS — Y999 Unspecified external cause status: Secondary | ICD-10-CM | POA: Insufficient documentation

## 2016-06-23 DIAGNOSIS — J449 Chronic obstructive pulmonary disease, unspecified: Secondary | ICD-10-CM | POA: Diagnosis not present

## 2016-06-23 DIAGNOSIS — S299XXA Unspecified injury of thorax, initial encounter: Secondary | ICD-10-CM | POA: Diagnosis present

## 2016-06-23 DIAGNOSIS — Y939 Activity, unspecified: Secondary | ICD-10-CM | POA: Diagnosis not present

## 2016-06-23 DIAGNOSIS — S22088A Other fracture of T11-T12 vertebra, initial encounter for closed fracture: Secondary | ICD-10-CM | POA: Diagnosis not present

## 2016-06-23 DIAGNOSIS — M545 Low back pain: Secondary | ICD-10-CM | POA: Diagnosis not present

## 2016-06-23 DIAGNOSIS — S22080A Wedge compression fracture of T11-T12 vertebra, initial encounter for closed fracture: Secondary | ICD-10-CM | POA: Diagnosis not present

## 2016-06-23 DIAGNOSIS — Z87891 Personal history of nicotine dependence: Secondary | ICD-10-CM | POA: Diagnosis not present

## 2016-06-23 LAB — CBC WITH DIFFERENTIAL/PLATELET
Basophils Absolute: 0 10*3/uL (ref 0.0–0.1)
Basophils Relative: 0 %
Eosinophils Absolute: 0.1 10*3/uL (ref 0.0–0.7)
Eosinophils Relative: 2 %
HCT: 36.5 % (ref 36.0–46.0)
Hemoglobin: 12.7 g/dL (ref 12.0–15.0)
LYMPHS ABS: 1.9 10*3/uL (ref 0.7–4.0)
LYMPHS PCT: 27 %
MCH: 31.5 pg (ref 26.0–34.0)
MCHC: 34.8 g/dL (ref 30.0–36.0)
MCV: 90.6 fL (ref 78.0–100.0)
MONO ABS: 0.5 10*3/uL (ref 0.1–1.0)
MONOS PCT: 7 %
Neutro Abs: 4.5 10*3/uL (ref 1.7–7.7)
Neutrophils Relative %: 64 %
PLATELETS: 309 10*3/uL (ref 150–400)
RBC: 4.03 MIL/uL (ref 3.87–5.11)
RDW: 13.3 % (ref 11.5–15.5)
WBC: 7.1 10*3/uL (ref 4.0–10.5)

## 2016-06-23 LAB — URINALYSIS, ROUTINE W REFLEX MICROSCOPIC
Bilirubin Urine: NEGATIVE
GLUCOSE, UA: NEGATIVE mg/dL
HGB URINE DIPSTICK: NEGATIVE
KETONES UR: NEGATIVE mg/dL
Leukocytes, UA: NEGATIVE
Nitrite: NEGATIVE
PH: 6 (ref 5.0–8.0)
PROTEIN: NEGATIVE mg/dL
Specific Gravity, Urine: 1.017 (ref 1.005–1.030)

## 2016-06-23 LAB — BASIC METABOLIC PANEL
Anion gap: 8 (ref 5–15)
BUN: 16 mg/dL (ref 6–20)
CALCIUM: 9 mg/dL (ref 8.9–10.3)
CO2: 28 mmol/L (ref 22–32)
Chloride: 99 mmol/L — ABNORMAL LOW (ref 101–111)
Creatinine, Ser: 0.58 mg/dL (ref 0.44–1.00)
GFR calc Af Amer: >60 mL/min (ref >60)
GLUCOSE: 98 mg/dL (ref 65–99)
Potassium: 3.1 mmol/L — ABNORMAL LOW (ref 3.5–5.1)
Sodium: 135 mmol/L (ref 135–145)

## 2016-06-23 MED ORDER — POTASSIUM CHLORIDE CRYS ER 20 MEQ PO TBCR
40.0000 meq | EXTENDED_RELEASE_TABLET | Freq: Once | ORAL | Status: AC
  Administered 2016-06-23: 40 meq via ORAL
  Filled 2016-06-23: qty 2

## 2016-06-23 MED ORDER — MORPHINE SULFATE (PF) 4 MG/ML IV SOLN
4.0000 mg | Freq: Once | INTRAVENOUS | Status: AC
  Administered 2016-06-23: 4 mg via INTRAVENOUS
  Filled 2016-06-23: qty 1

## 2016-06-23 MED ORDER — HYDROCODONE-ACETAMINOPHEN 5-325 MG PO TABS
1.0000 | ORAL_TABLET | Freq: Once | ORAL | Status: AC
  Administered 2016-06-23: 1 via ORAL
  Filled 2016-06-23: qty 1

## 2016-06-23 MED ORDER — HYDROCODONE-ACETAMINOPHEN 5-325 MG PO TABS: 1.0000 | ORAL_TABLET | ORAL | 0 refills | Status: DC | PRN

## 2016-06-23 MED ORDER — DIAZEPAM 2 MG PO TABS: 2.0000 mg | ORAL_TABLET | Freq: Three times a day (TID) | ORAL | 0 refills | Status: DC | PRN

## 2016-06-23 MED ORDER — LORAZEPAM 2 MG/ML IJ SOLN
0.5000 mg | Freq: Once | INTRAMUSCULAR | Status: AC
  Administered 2016-06-23: 0.5 mg via INTRAVENOUS
  Filled 2016-06-23: qty 1

## 2016-06-23 MED ORDER — SODIUM CHLORIDE 0.9 % IV SOLN
INTRAVENOUS | Status: DC
  Administered 2016-06-23: 12:00:00 via INTRAVENOUS

## 2016-06-23 NOTE — Discharge Instructions (Signed)
Call your doctor to schedule a follow-up visit. Please mention that kyphoplasty may be helpful for your condition

## 2016-06-23 NOTE — ED Triage Notes (Signed)
Pt complaint of acute chronic mid/lower back pain since Tuesday; hx related to pinched nerve post back surgery several years ago.

## 2016-06-23 NOTE — ED Notes (Signed)
Patient transported to CT 

## 2016-06-23 NOTE — ED Provider Notes (Signed)
[facility] DEPT Provider Note   CSN: [id number] Arrival date & time: 06/23/16  1059     History   Chief Complaint Chief Complaint  Patient presents with  . Back Pain    HPI NANETTA WIEGMAN is a 83 y.o. female.  This 83 year old female presents with acute onset of mid to low back and left flank pain which began last week. Pain is characterized as sharp and worse with any movement. Denies any history of trauma. She does use Eilquis. No radiation to her legs. No bowel or bladder dysfunction. Denies any numbness to her perineum. Symptoms better with remaining still. Has used over-the-counter medications without relief. Does have a prior history of back surgery.      Past Medical History:  Diagnosis Date  . Anxiety   . Arthritis   . Atrial fibrillation ([place])   . COPD (chronic obstructive pulmonary disease) ([place])   . Dysrhythmia    AF  . Essential hypertension, benign   . Gallstones   . GERD (gastroesophageal reflux disease)   . H/O hiatal hernia   . History of nuclear stress test 10/2010   dipyridamole; normal pattern of perfusion; low risk, normal study  . 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 Mild aortic insufficiency   . Osteoporosis   . Peripheral neuropathy ([place])   . Pneumonia    states she had it twice, last time a couple of months ago  . PONV (postoperative nausea and vomiting)   . Shortness of breath    with exertion  . Unspecified hypothyroidism   . Venous insufficiency     Patient Active Problem List   Diagnosis Date Noted  . Influenza-like illness 04/16/2016  . Sepsis ([place]) 04/16/2016  . Hypokalemia 04/16/2016  . Hypothyroidism 04/16/2016  . Post herpetic neuralgia 06/06/2014  . Shingles outbreak 04/19/2014  . Gallstones 09/20/2013  . Community acquired pneumonia 05/17/2013  . COPD (chronic obstructive pulmonary disease) ([place]) 05/17/2013  . Dyspnea on exertion 04/25/2013  . Pulmonary emphysema ([place]) 11/15/2012   . HTN (hypertension) 11/15/2012  . PAF (paroxysmal atrial fibrillation) ([place]) 06/14/2012  . Long term current use of anticoagulant therapy 06/14/2012  . Ventral hernia 07/31/2011  . Abdominal wall mass 07/01/2011    Past Surgical History:  Procedure Laterality Date  . APPENDECTOMY  1935  . BACK SURGERY     x2  . Cardiometablic Testing  07/07/8117   submaximal effort with peak RER of 0.5, peak VO2 79% predicted; HR peak up to 78%; PVC was 55% predicted, PEV1 39% predicted; PEV1/VC ratio was reduced; normal vital capacity; DLCO was reduced to 63%  . CARPAL TUNNEL RELEASE    . CATARACT EXTRACTION W/ INTRAOCULAR LENS  IMPLANT, BILATERAL    . CHOLECYSTECTOMY  04/07/2014   dr toth  . CHOLECYSTECTOMY N/A 04/07/2014   Procedure: LAPAROSCOPIC CHOLECYSTECTOMY WITH INTRAOPERATIVE CHOLANGIOGRAM POSSBILE OPEN;  Surgeon: Autumn Messing III, MD;  Location: [facility];  Service: General;  Laterality: N/A;  . COLON SURGERY  2008   partial  . HERNIA REPAIR    . JOINT REPLACEMENT    . left hip relacement  2000  . TONSILLECTOMY  1935  . TOTAL ABDOMINAL HYSTERECTOMY  1970  . TRANSTHORACIC ECHOCARDIOGRAM  05/2011   EF=>55%; mild conc LVH; mild mitral annular calcif; mild TR; AV mildly sclerotic & mild AR  . VENTRAL HERNIA REPAIR  09/19/2011   Procedure: LAPAROSCOPIC VENTRAL HERNIA;  Surgeon: Merrie Roof, MD;  Location: [facility];  Service: General;  Laterality: N/A;  laparoscopic ventral hernia repair with mesh    OB History    No data available       Home Medications    Prior to Admission medications   Medication Sig Start Date End Date Taking? Authorizing Provider  albuterol (PROVENTIL HFA;VENTOLIN HFA) 108 (90 BASE) MCG/ACT inhaler Inhale 2 puffs into the lungs every 6 (six) hours as needed for wheezing or shortness of breath. 05/23/13   Burnard Bunting, MD  amitriptyline (ELAVIL) 25 MG tablet Take 25 mg by mouth at bedtime.    Historical Provider, MD  apixaban (ELIQUIS) 2.5 MG TABS tablet Take 1 tablet  (2.5 mg total) by mouth 2 (two) times daily. 05/19/16   Pixie Casino, MD  Budesonide-Formoterol Fumarate (SYMBICORT IN) Inhale into the lungs 2 (two) times daily.    Historical Provider, MD  calcium citrate-vitamin D (CITRACAL+D) 315-200 MG-UNIT per tablet Take 1 tablet by mouth daily.    Historical Provider, MD  diltiazem (CARDIZEM CD) 180 MG 24 hr capsule Take 180 mg by mouth daily.    Historical Provider, MD  hydrochlorothiazide (HYDRODIURIL) 25 MG tablet Take 1 tablet (25 mg total) by mouth daily. 04/11/16   Pixie Casino, MD  levothyroxine (SYNTHROID, LEVOTHROID) 137 MCG tablet Take 137 mcg by mouth daily before breakfast.     Historical Provider, MD  losartan (COZAAR) 100 MG tablet TAKE 1 TABLET BY MOUTH EVERY DAY KEEP OFFICE VISIT 04/11/16   Pixie Casino, MD  Multiple Vitamins-Minerals (MULTIVITAMIN WITH MINERALS) tablet Take 1 tablet by mouth daily.    Historical Provider, MD  naphazoline-glycerin (CLEAR EYES) 0.012-0.2 % SOLN Place 1-2 drops into both eyes daily.     Historical Provider, MD  polyethylene glycol (MIRALAX / GLYCOLAX) packet Take 17 g by mouth daily as needed for moderate constipation or severe constipation.    Historical Provider, MD  predniSONE (DELTASONE) 10 MG tablet Take 4 tabs for 3 days, then 3 tabs for 3 days, then 2 tabs for 3 days, then 1 tab for 3 days, then 1/2 tab for 4 days. 04/19/16   [place], DO  promethazine-codeine (PHENERGAN WITH CODEINE) 6.25-10 MG/5ML syrup Take 5 mLs by mouth every 6 (six) hours as needed for cough. 04/19/16   Shon Millet, DO    Family History Family History  Problem Relation Age of Onset  . Stroke Mother   . Stroke Father   . Heart block Brother   . Bladder Cancer Brother   . Diabetes Brother   . Stroke Brother     Social History Social History  Substance Use Topics  . Smoking status: Former Smoker    Packs/day: 1.00    Years: 20.00    Types: Cigarettes    Quit date: 09/11/1988  . Smokeless  tobacco: Never Used  . Alcohol use 0.0 oz/week     Comment: occ     Allergies   Ace inhibitors and Sulfur   Review of Systems Review of Systems  All other systems reviewed and are negative.    Physical Exam Updated Vital Signs BP (!) 193/78 (BP Location: Right Arm)   Pulse 89   Temp 97.5 F (36.4 C) (Oral)   Resp (!) 22   Wt 53.5 kg   SpO2 97%   BMI 20.90 kg/m   Physical Exam  Constitutional: She is oriented to person, place, and time. She appears well-developed and well-nourished.  Non-toxic appearance. No distress.  HENT:  Head: Normocephalic and atraumatic.  Eyes:  Conjunctivae, EOM and lids are normal. Pupils are equal, round, and reactive to light.  Neck: Normal range of motion. Neck supple. No tracheal deviation present. No thyroid mass present.  Cardiovascular: Normal rate, regular rhythm and normal heart sounds.  Exam reveals no gallop.   No murmur heard. Pulmonary/Chest: Effort normal and breath sounds normal. No stridor. No respiratory distress. She has no decreased breath sounds. She has no wheezes. She has no rhonchi. She has no rales.  Abdominal: Soft. Normal appearance and bowel sounds are normal. She exhibits no distension. There is no tenderness. There is no rebound and no CVA tenderness.  Musculoskeletal: Normal range of motion. She exhibits no edema or tenderness.       Back:  Neurological: She is alert and oriented to person, place, and time. She has normal strength. No cranial nerve deficit or sensory deficit. GCS eye subscore is 4. GCS verbal subscore is 5. GCS motor subscore is 6.  Skin: Skin is warm and dry. No abrasion and no rash noted.  Psychiatric: She has a normal mood and affect. Her speech is normal and behavior is normal.  Nursing note and vitals reviewed.    ED Treatments / Results  Labs (all labs ordered are listed, but only abnormal results are displayed) Labs Reviewed  CBC WITH DIFFERENTIAL/PLATELET  BASIC METABOLIC PANEL    URINALYSIS, ROUTINE W REFLEX MICROSCOPIC    EKG  EKG Interpretation None       Radiology No results found.  Procedures Procedures (including critical care time)  Medications Ordered in ED Medications  0.9 %  sodium chloride infusion (not administered)  LORazepam (ATIVAN) injection 0.5 mg (not administered)  morphine 4 MG/ML injection 4 mg (not administered)     Initial Impression / Assessment and Plan / ED Course  I have reviewed the triage vital signs and the nursing notes.  Pertinent labs & imaging results that were available during my care of the patient were reviewed by me and considered in my medical decision making (see chart for details).     Patient medicated for pain here and feels better. She had mild hypokalemia that was treated with oral potassium. Has evidence of new T11 compression fracture on CT without evidence of retroperitoneal hemorrhage.  Patient to follow-up with her primary care for possible kyphoplasty. She has no neurological deficits at time of discharge. Final Clinical Impressions(s) / ED Diagnoses   Final diagnoses:  None    New Prescriptions New Prescriptions   No medications on file     Lacretia Leigh, MD 06/23/16 1424

## 2016-06-24 ENCOUNTER — Ambulatory Visit (HOSPITAL_COMMUNITY)
Admission: RE | Admit: 2016-06-24 | Discharge: 2016-06-24 | Disposition: A | Discharge status: Home / Self Care | Payer: Medicare HMO | Source: Ambulatory Visit | Attending: Internal Medicine | Admitting: Internal Medicine

## 2016-06-25 ENCOUNTER — Encounter (HOSPITAL_COMMUNITY): Payer: Self-pay

## 2016-06-25 ENCOUNTER — Emergency Department (HOSPITAL_COMMUNITY): Payer: Medicare HMO

## 2016-06-25 ENCOUNTER — Emergency Department (HOSPITAL_COMMUNITY)
Admission: EM | Admit: 2016-06-25 | Discharge: 2016-06-25 | Disposition: A | Discharge status: Home / Self Care | Payer: Medicare HMO | Attending: Emergency Medicine | Admitting: Emergency Medicine

## 2016-06-25 DIAGNOSIS — Z96642 Presence of left artificial hip joint: Secondary | ICD-10-CM | POA: Insufficient documentation

## 2016-06-25 DIAGNOSIS — I10 Essential (primary) hypertension: Secondary | ICD-10-CM | POA: Insufficient documentation

## 2016-06-25 DIAGNOSIS — M546 Pain in thoracic spine: Secondary | ICD-10-CM | POA: Diagnosis not present

## 2016-06-25 DIAGNOSIS — Z7901 Long term (current) use of anticoagulants: Secondary | ICD-10-CM | POA: Diagnosis not present

## 2016-06-25 DIAGNOSIS — M5489 Other dorsalgia: Secondary | ICD-10-CM | POA: Diagnosis not present

## 2016-06-25 DIAGNOSIS — S299XXD Unspecified injury of thorax, subsequent encounter: Secondary | ICD-10-CM | POA: Diagnosis present

## 2016-06-25 DIAGNOSIS — E039 Hypothyroidism, unspecified: Secondary | ICD-10-CM | POA: Diagnosis not present

## 2016-06-25 DIAGNOSIS — Z87891 Personal history of nicotine dependence: Secondary | ICD-10-CM | POA: Diagnosis not present

## 2016-06-25 DIAGNOSIS — J449 Chronic obstructive pulmonary disease, unspecified: Secondary | ICD-10-CM | POA: Diagnosis not present

## 2016-06-25 DIAGNOSIS — S22088D Other fracture of T11-T12 vertebra, subsequent encounter for fracture with routine healing: Secondary | ICD-10-CM | POA: Insufficient documentation

## 2016-06-25 DIAGNOSIS — X58XXXD Exposure to other specified factors, subsequent encounter: Secondary | ICD-10-CM | POA: Insufficient documentation

## 2016-06-25 DIAGNOSIS — S22000S Wedge compression fracture of unspecified thoracic vertebra, sequela: Secondary | ICD-10-CM

## 2016-06-25 DIAGNOSIS — S22080S Wedge compression fracture of T11-T12 vertebra, sequela: Secondary | ICD-10-CM | POA: Diagnosis not present

## 2016-06-25 DIAGNOSIS — M549 Dorsalgia, unspecified: Secondary | ICD-10-CM

## 2016-06-25 DIAGNOSIS — M62830 Muscle spasm of back: Secondary | ICD-10-CM | POA: Diagnosis not present

## 2016-06-25 DIAGNOSIS — R200 Anesthesia of skin: Secondary | ICD-10-CM | POA: Diagnosis not present

## 2016-06-25 DIAGNOSIS — I6789 Other cerebrovascular disease: Secondary | ICD-10-CM | POA: Diagnosis not present

## 2016-06-25 MED ORDER — OXYCODONE-ACETAMINOPHEN 5-325 MG PO TABS: 1.0000 | ORAL_TABLET | ORAL | 0 refills | Status: DC | PRN

## 2016-06-25 MED ORDER — LIDOCAINE 5 % EX PTCH: 1.0000 | MEDICATED_PATCH | CUTANEOUS | 0 refills | Status: DC

## 2016-06-25 MED ORDER — OXYCODONE-ACETAMINOPHEN 5-325 MG PO TABS
1.0000 | ORAL_TABLET | Freq: Once | ORAL | Status: AC
  Administered 2016-06-25: 1 via ORAL
  Filled 2016-06-25: qty 1

## 2016-06-25 NOTE — ED Provider Notes (Signed)
[facility] DEPT Provider Note   CSN: [id number] Arrival date & time: 06/25/16  1419     History   Chief Complaint Chief Complaint  Patient presents with  . Back Pain    HPI Heather [name] is a 83 y.o. female who presents with persistent, worsening left sided back pain. She was seen at [facility] ED on 06/23/16 for same symptoms. At that time, CT showed evidence of new T11 compression fracture. She was discharged home with instructions to follow-up with her PCP. Patient reports she had been improving and had been able to ambulate over the last few days. Her pain significantly worsened this morning when she attempted to sit up and get out of bed. She has not been able to ambulate or bear weight at all today. She states that pain is a 10/10. Her pain is radiating down her RLE, which she states is new.  She has taken Flexeril and Vicodin with no relief of pain. Pain is worsened with movement. No alleviating factors. She denies any new trauma or falls since being seen at [facility]. She denies any fevers or night sweats.   The history is provided by the patient.    Past Medical History:  Diagnosis Date  . Anxiety   . Arthritis   . Atrial fibrillation ([place])   . COPD (chronic obstructive pulmonary disease) ([place])   . Dysrhythmia    AF  . Essential hypertension, benign   . Gallstones   . GERD (gastroesophageal reflux disease)   . H/O hiatal hernia   . History of nuclear stress test 10/2010   dipyridamole; normal pattern of perfusion; low risk, normal study  . 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 Mild aortic insufficiency   . Osteoporosis   . Peripheral neuropathy ([place])   . Pneumonia    states she had it twice, last time a couple of months ago  . PONV (postoperative nausea and vomiting)   . Shortness of breath    with exertion  . Unspecified hypothyroidism   . Venous insufficiency     Patient Active Problem List   Diagnosis Date Noted  .  Influenza-like illness 04/16/2016  . Sepsis ([place]) 04/16/2016  . Hypokalemia 04/16/2016  . Hypothyroidism 04/16/2016  . Post herpetic neuralgia 06/06/2014  . Shingles outbreak 04/19/2014  . Gallstones 09/20/2013  . Community acquired pneumonia 05/17/2013  . COPD (chronic obstructive pulmonary disease) ([place]) 05/17/2013  . Dyspnea on exertion 04/25/2013  . Pulmonary emphysema ([place]) 11/15/2012  . HTN (hypertension) 11/15/2012  . PAF (paroxysmal atrial fibrillation) ([place]) 06/14/2012  . Long term current use of anticoagulant therapy 06/14/2012  . Ventral hernia 07/31/2011  . Abdominal wall mass 07/01/2011    Past Surgical History:  Procedure Laterality Date  . APPENDECTOMY  1935  . BACK SURGERY     x2  . Cardiometablic Testing  07/07/8117   submaximal effort with peak RER of 0.5, peak VO2 79% predicted; HR peak up to 78%; PVC was 55% predicted, PEV1 39% predicted; PEV1/VC ratio was reduced; normal vital capacity; DLCO was reduced to 63%  . CARPAL TUNNEL RELEASE    . CATARACT EXTRACTION W/ INTRAOCULAR LENS  IMPLANT, BILATERAL    . CHOLECYSTECTOMY  04/07/2014   dr toth  . CHOLECYSTECTOMY N/A 04/07/2014   Procedure: LAPAROSCOPIC CHOLECYSTECTOMY WITH INTRAOPERATIVE CHOLANGIOGRAM POSSBILE OPEN;  Surgeon: Autumn Messing III, MD;  Location: [facility];  Service: General;  Laterality: N/A;  . COLON SURGERY  2008   partial  . HERNIA  REPAIR    . JOINT REPLACEMENT    . left hip relacement  2000  . TONSILLECTOMY  1935  . TOTAL ABDOMINAL HYSTERECTOMY  1970  . TRANSTHORACIC ECHOCARDIOGRAM  05/2011   EF=>55%; mild conc LVH; mild mitral annular calcif; mild TR; AV mildly sclerotic & mild AR  . VENTRAL HERNIA REPAIR  09/19/2011   Procedure: LAPAROSCOPIC VENTRAL HERNIA;  Surgeon: Merrie Roof, MD;  Location: [facility];  Service: General;  Laterality: N/A;  laparoscopic ventral hernia repair with mesh    OB History    No data available       Home Medications    Prior to Admission medications   Medication  Sig Start Date End Date Taking? Authorizing Provider  albuterol (PROVENTIL HFA;VENTOLIN HFA) 108 (90 BASE) MCG/ACT inhaler Inhale 2 puffs into the lungs every 6 (six) hours as needed for wheezing or shortness of breath. 05/23/13   Burnard Bunting, MD  amitriptyline (ELAVIL) 25 MG tablet Take 25 mg by mouth at bedtime.    Historical Provider, MD  apixaban (ELIQUIS) 2.5 MG TABS tablet Take 1 tablet (2.5 mg total) by mouth 2 (two) times daily. 05/19/16   Pixie Casino, MD  Budesonide-Formoterol Fumarate (SYMBICORT IN) Inhale into the lungs 2 (two) times daily.    Historical Provider, MD  calcium citrate-vitamin D (CITRACAL+D) 315-200 MG-UNIT per tablet Take 1 tablet by mouth daily.    Historical Provider, MD  diazepam (VALIUM) 2 MG tablet Take 1 tablet (2 mg total) by mouth every 8 (eight) hours as needed for muscle spasms. 06/23/16   Lacretia Leigh, MD  diltiazem (CARDIZEM CD) 180 MG 24 hr capsule Take 180 mg by mouth daily.    Historical Provider, MD  hydrochlorothiazide (HYDRODIURIL) 25 MG tablet Take 1 tablet (25 mg total) by mouth daily. 04/11/16   Pixie Casino, MD  HYDROcodone-acetaminophen (NORCO/VICODIN) 5-325 MG tablet Take 1-2 tablets by mouth every 4 (four) hours as needed. 06/23/16   Lacretia Leigh, MD  levothyroxine (SYNTHROID, LEVOTHROID) 137 MCG tablet Take 137 mcg by mouth daily before breakfast.     Historical Provider, MD  lidocaine (LIDODERM) 5 % Place 1 patch onto the skin daily. Remove & Discard patch within 12 hours or as directed by MD 06/25/16   Orson Aloe, PA  losartan (COZAAR) 100 MG tablet TAKE 1 TABLET BY MOUTH EVERY DAY KEEP OFFICE VISIT 04/11/16   Pixie Casino, MD  Multiple Vitamins-Minerals (MULTIVITAMIN WITH MINERALS) tablet Take 1 tablet by mouth daily.    Historical Provider, MD  naphazoline-glycerin (CLEAR EYES) 0.012-0.2 % SOLN Place 1-2 drops into both eyes daily.     Historical Provider, MD  oxyCODONE-acetaminophen (PERCOCET/ROXICET) 5-325 MG tablet Take 1-2  tablets by mouth every 4 (four) hours as needed for severe pain. 06/25/16   Orson Aloe, PA  polyethylene glycol [facility] / GLYCOLAX) packet Take 17 g by mouth daily as needed for moderate constipation or severe constipation.    Historical Provider, MD  predniSONE (DELTASONE) 10 MG tablet Take 4 tabs for 3 days, then 3 tabs for 3 days, then 2 tabs for 3 days, then 1 tab for 3 days, then 1/2 tab for 4 days. 04/19/16   [place], DO  promethazine-codeine (PHENERGAN WITH CODEINE) 6.25-10 MG/5ML syrup Take 5 mLs by mouth every 6 (six) hours as needed for cough. 04/19/16   Shon Millet, DO    Family History Family History  Problem Relation Age of Onset  . Stroke Mother   .  Stroke Father   . Heart block Brother   . Bladder Cancer Brother   . Diabetes Brother   . Stroke Brother     Social History Social History  Substance Use Topics  . Smoking status: Former Smoker    Packs/day: 1.00    Years: 20.00    Types: Cigarettes    Quit date: 09/11/1988  . Smokeless tobacco: Never Used  . Alcohol use 0.0 oz/week     Comment: occ     Allergies   Ace inhibitors and Sulfur   Review of Systems Review of Systems  Constitutional: Negative for chills and fever.  HENT: Negative for congestion, rhinorrhea and sore throat.   Eyes: Negative for visual disturbance.  Respiratory: Negative for cough and shortness of breath.   Cardiovascular: Negative for chest pain.  Gastrointestinal: Negative for abdominal pain, blood in stool, diarrhea, nausea and vomiting.  Genitourinary: Negative for dysuria and hematuria.  Musculoskeletal: Positive for back pain. Negative for neck pain.  Skin: Negative for rash.  Neurological: Negative for dizziness, weakness, numbness and headaches.  Psychiatric/Behavioral: Negative for confusion.  All other systems reviewed and are negative.    Physical Exam Updated Vital Signs BP (!) 161/78   Pulse 94   Temp 98.2 F (36.8 C) (Oral)    Resp (!) 22   SpO2 100%   Physical Exam  Constitutional: She is oriented to person, place, and time. She appears well-developed and well-nourished.  HENT:  Head: Normocephalic and atraumatic.  Mouth/Throat: Oropharynx is clear and moist and mucous membranes are normal.  Eyes: Conjunctivae, EOM and lids are normal. Pupils are equal, round, and reactive to light.  Neck: Full passive range of motion without pain.  Cardiovascular: Normal rate, regular rhythm, normal heart sounds and normal pulses.  Exam reveals no gallop and no friction rub.   No murmur heard. Pulmonary/Chest: Effort normal and breath sounds normal.  Abdominal: Soft. Normal appearance. There is no tenderness. There is no rigidity and no guarding.  Musculoskeletal: Normal range of motion.       Cervical back: She exhibits no tenderness.       Back:  No midline thoracic or lumbar tenderness. Tenderness to palpation to left paraspinal at lower T/upper L spine area.   Neurological: She is alert and oriented to person, place, and time.  Moves all extremities spontaneously.  Follow commands.   Skin: Skin is warm and dry. Capillary refill takes less than 2 seconds. No rash noted.  Psychiatric: She has a normal mood and affect. Her speech is normal.  Nursing note and vitals reviewed.    ED Treatments / Results  Labs (all labs ordered are listed, but only abnormal results are displayed) Labs Reviewed - No data to display  EKG  EKG Interpretation None       Radiology Dg Thoracic Spine W/swimmers  Result Date: 06/25/2016 CLINICAL DATA:  Mid upper back spasms for a week. Evaluate T11 fracture. EXAM: THORACIC SPINE - 3 VIEWS COMPARISON:  Chest radiograph April 16, 2016 FINDINGS: Moderate T11 compression fracture with approximately 50% height loss new from prior radiograph. Old T8 moderate to severe compression fracture. Remaining thoracic vertebral bodies appear intact. Maintained thoracic kyphosis without malalignment.  Osteopenia. Multilevel mild degenerative discs inferred by disc height loss and endplate spurring. Mild mid thoracic levoscoliosis. Surgical clips in the included right abdomen compatible with cholecystectomy. IMPRESSION: New moderate T11 compression deformity. Old moderate to severe T8 compression deformity. No malalignment Osteopenia. Electronically Signed   By: Sandie Ano  Bloomer M.D.   On: 06/25/2016 15:37    Procedures Procedures (including critical care time)  Medications Ordered in ED Medications  oxyCODONE-acetaminophen (PERCOCET/ROXICET) 5-325 MG per tablet 1 tablet (1 tablet Oral Given 06/25/16 1513)  oxyCODONE-acetaminophen (PERCOCET/ROXICET) 5-325 MG per tablet 1 tablet (1 tablet Oral Given 06/25/16 1832)     Initial Impression / Assessment and Plan / ED Course  I have reviewed the triage vital signs and the nursing notes.  Pertinent labs & imaging results that were available during my care of the patient were reviewed by me and considered in my medical decision making (see chart for details).    83 yo F with known T11 compression fracture confirmed by CT at [facility] ED on 06/23/16 presents with worsening back pain. Not relieved with Vicodin or Flexeril use. No new traumas or falls. Pain is radiating down RLE which is new. No red flag symptoms as documented above in HPI. Plan to check XR T spine to eval fracture. Will give Percocet for pain relief.   4:00 PM: Patient reports improvement of pain with percocet. XR shows stable T11 fracture as noted in the [facility] ED on 06/23/16. Discussed pain regimen with patient. She has been taking the 1 tab Vicodin q12 hours because she doesn't like to take pain medication. Dr. Rex Kras and I discussed that if she goes home, she can take the 2 tab Vicodin q4 hours for pain. Since Percocet worked in the ED, we can send her home with a short course and have her switch to Percocet if the Vicodin doesn't work. Discussed with patient's son on the phone. He is  concerned about managing her pain at home and wonders if she needs to be admitted. Reassured son that XR was stable at this time and that if pain can achieve pain control in the ED, she can potentially be discharged home.   4:40 PM: Will ambulate patient in the hallway. If able to ambulate without pain we can plan to discharge her home with Percocet for pain. She has a follow-up appointment arranged with her Primary care doctor tomorrow.   5:20 PM: Patient was not able to ambulate due to pain.   6:01 PM: Discussed at length with patient and son at bedside. Patient ultimately like to go home if we can get the pain controlled in the ED. Will plan to give her something to eat and give another dose of percocet and retry the ambulation.   7:10 PM: Patient was able to ambulate without pain after 2nd percocet. Discussed with patient. She is willing to go home with pain medication. She has a follow-up appointment arranged with her PCP tomorrow. Extensive conversation with patient and son regarding discharge home. Patient feels like her pain is controlled with percocet and she is able to go home. Instructed patient to take pain medication as directed and not to skip doses. Also instructed her to limit her heavy lifting or strenuous activity. Return precautions discussed. Patient and son expresses understanding and agreement to plan.   Final Clinical Impressions(s) / ED Diagnoses   Final diagnoses:  Thoracic compression fracture, sequela  Left-sided thoracic back pain, unspecified chronicity    New Prescriptions Discharge Medication List as of 06/25/2016  7:30 PM    START taking these medications   Details  lidocaine (LIDODERM) 5 % Place 1 patch onto the skin daily. Remove & Discard patch within 12 hours or as directed by MD, Starting Wed 06/25/2016, [place]    oxyCODONE-acetaminophen (PERCOCET/ROXICET) 5-325 MG  tablet Take 1-2 tablets by mouth every 4 (four) hours as needed for severe pain., Starting Wed  06/25/2016, [place], [place] 06/26/16 [place], MD 07/09/16 [phone]

## 2016-06-25 NOTE — ED Notes (Signed)
Patient was given a [place] Sandwich bag with a cup of Sprite.

## 2016-06-25 NOTE — ED Notes (Signed)
Pt unable to ambulate due to pain, pt sat up and yelled in pain and started tearing up. Unable to stand

## 2016-06-25 NOTE — Discharge Instructions (Addendum)
Take the 2 tablets of Vicodin every 4 hours for pain. If you find that the Vicodin is not helping the pain, you can take the Percocet that we have prescribed you. Do not take the Percocet and the Vicodin at the same time.  If you take the Percocet, you can take 1-2 tablets every 4-6 hours for pain.   Use the Lidoderm patches as directed.   Follow up with your Primary Care doctor. Call tomorrow morning and see if you can arrange for an appointment either tomorrow or Friday.   Do not do any heavy lifting and try to limit any strenuous activity. Be careful not to fall.   Be aware that if you develop new symptoms, such as a fever, leg weakness, difficulty with or loss of control of your urine or bowels, abdominal pain, or more severe pain, you will need to seek medical attention and  / or return to the Emergency department.

## 2016-06-25 NOTE — ED Triage Notes (Signed)
Pt presents with continued back pain after T-spine fracture x 1 week ago.  Pt reports she can not bear weight today due to pain; denies any bowel or bladder incontinence.

## 2016-07-02 DIAGNOSIS — R7302 Impaired glucose tolerance (oral): Secondary | ICD-10-CM | POA: Diagnosis not present

## 2016-07-02 DIAGNOSIS — E038 Other specified hypothyroidism: Secondary | ICD-10-CM | POA: Diagnosis not present

## 2016-07-02 DIAGNOSIS — I10 Essential (primary) hypertension: Secondary | ICD-10-CM | POA: Diagnosis not present

## 2016-07-02 DIAGNOSIS — I4891 Unspecified atrial fibrillation: Secondary | ICD-10-CM | POA: Diagnosis not present

## 2016-07-02 DIAGNOSIS — G6289 Other specified polyneuropathies: Secondary | ICD-10-CM | POA: Diagnosis not present

## 2016-07-02 DIAGNOSIS — M5416 Radiculopathy, lumbar region: Secondary | ICD-10-CM | POA: Diagnosis not present

## 2016-07-02 DIAGNOSIS — M810 Age-related osteoporosis without current pathological fracture: Secondary | ICD-10-CM | POA: Diagnosis not present

## 2016-07-02 DIAGNOSIS — I951 Orthostatic hypotension: Secondary | ICD-10-CM | POA: Diagnosis not present

## 2016-07-02 DIAGNOSIS — J449 Chronic obstructive pulmonary disease, unspecified: Secondary | ICD-10-CM | POA: Diagnosis not present

## 2016-07-03 ENCOUNTER — Other Ambulatory Visit: Payer: Self-pay | Admitting: Internal Medicine

## 2016-07-03 DIAGNOSIS — T148XXA Other injury of unspecified body region, initial encounter: Secondary | ICD-10-CM

## 2016-07-15 ENCOUNTER — Ambulatory Visit
Admission: RE | Admit: 2016-07-15 | Discharge: 2016-07-15 | Disposition: A | Discharge status: Home / Self Care | Payer: Medicare HMO | Source: Ambulatory Visit | Attending: Internal Medicine | Admitting: Internal Medicine

## 2016-07-15 ENCOUNTER — Other Ambulatory Visit: Payer: Medicare HMO

## 2016-07-15 DIAGNOSIS — T148XXA Other injury of unspecified body region, initial encounter: Secondary | ICD-10-CM

## 2016-07-15 DIAGNOSIS — Z981 Arthrodesis status: Secondary | ICD-10-CM | POA: Diagnosis not present

## 2016-07-15 DIAGNOSIS — S22089A Unspecified fracture of T11-T12 vertebra, initial encounter for closed fracture: Secondary | ICD-10-CM | POA: Diagnosis not present

## 2016-07-16 DIAGNOSIS — M810 Age-related osteoporosis without current pathological fracture: Secondary | ICD-10-CM | POA: Diagnosis not present

## 2016-07-16 DIAGNOSIS — Z79899 Other long term (current) drug therapy: Secondary | ICD-10-CM | POA: Diagnosis not present

## 2016-07-30 ENCOUNTER — Encounter (HOSPITAL_COMMUNITY): Payer: Medicare HMO

## 2016-08-08 ENCOUNTER — Encounter (HOSPITAL_COMMUNITY): Payer: Self-pay

## 2016-08-08 ENCOUNTER — Ambulatory Visit (HOSPITAL_COMMUNITY)
Admission: RE | Admit: 2016-08-08 | Discharge: 2016-08-08 | Disposition: A | Discharge status: Home / Self Care | Payer: Medicare HMO | Source: Ambulatory Visit | Attending: Internal Medicine | Admitting: Internal Medicine

## 2016-08-08 DIAGNOSIS — M810 Age-related osteoporosis without current pathological fracture: Secondary | ICD-10-CM | POA: Diagnosis not present

## 2016-08-08 MED ORDER — ZOLEDRONIC ACID 5 MG/100ML IV SOLN
5.0000 mg | Freq: Once | INTRAVENOUS | Status: AC
  Administered 2016-08-08: 5 mg via INTRAVENOUS
  Filled 2016-08-08: qty 100

## 2016-08-08 MED ORDER — SODIUM CHLORIDE 0.9 % IV SOLN
Freq: Once | INTRAVENOUS | Status: AC
  Administered 2016-08-08: 11:00:00 via INTRAVENOUS

## 2016-08-08 NOTE — Progress Notes (Signed)
Tolerated infusion without event.

## 2016-08-08 NOTE — Discharge Instructions (Signed)

## 2016-09-08 ENCOUNTER — Telehealth: Payer: Self-pay | Admitting: Internal Medicine

## 2016-09-08 NOTE — Telephone Encounter (Signed)
Spoke with pt, she scratched her leg Sunday morning and had quite a bit of bleeding from it. It is under control now and she is wanting direction regarding the eliquis. Advised patient to continue eliquis, yearly follow up appointment made for next available. She will call back if needed.

## 2016-09-08 NOTE — Telephone Encounter (Signed)
New message        Pt c/o medication issue:  1. Name of Medication: eliquis  2. How are you currently taking this medication (dosage and times per day)? 2.5mg  bid 3. Are you having a reaction (difficulty breathing--STAT)?  no 4. What is your medication issue? Pt has a big bruise on her leg.  Please advise

## 2016-09-19 ENCOUNTER — Encounter: Payer: Self-pay | Admitting: Internal Medicine

## 2016-09-19 ENCOUNTER — Ambulatory Visit (INDEPENDENT_AMBULATORY_CARE_PROVIDER_SITE_OTHER): Payer: Medicare HMO | Admitting: Internal Medicine

## 2016-09-19 VITALS — BP 128/90 | HR 88 | Ht 63.0 in | Wt 106.0 lb

## 2016-09-19 DIAGNOSIS — R0609 Other forms of dyspnea: Secondary | ICD-10-CM

## 2016-09-19 DIAGNOSIS — Z7901 Long term (current) use of anticoagulants: Secondary | ICD-10-CM

## 2016-09-19 DIAGNOSIS — T148XXA Other injury of unspecified body region, initial encounter: Secondary | ICD-10-CM | POA: Diagnosis not present

## 2016-09-19 DIAGNOSIS — I480 Paroxysmal atrial fibrillation: Secondary | ICD-10-CM | POA: Diagnosis not present

## 2016-09-19 DIAGNOSIS — J449 Chronic obstructive pulmonary disease, unspecified: Secondary | ICD-10-CM

## 2016-09-19 NOTE — Progress Notes (Signed)
Heather [name]   1927-01-14  [id number]  Primary Physicia Burnard Bunting, MD Primary Cardiologist: Dr Debara Pickett  CC: Injured left shin  HPI:  Pleasant 83 y/o followed by Dr Debara Pickett with a history of PAF. She recently had a MET test that indicated Emphysema although she has no history of smoking. She is on chronic Coumadin. Her B/P has been controlled by her readings at home and our readings here by our pharmacist. Echo in Feb 2013 showed an EF of >55%, NL LA size, and NL RVF. She had an episode of weakness and palpitations 11/13/12 pm. EMS was contacted and she was taken to [facility]. She received Diltiazem en route and was in NSR when she arrived at [facility] They suggested she increase her Diltiazem to 240 mg daily but she was hesitant to do this.  Heather [name] returns today for followup. She is extubated doing remarkably well. Her shortness of breath has stabilized and she has not been admitted recently for worsening COPD or pulmonary problems. She is maintaining a sinus rhythm in the 80s without any evidence of recurrent A. fib. She is on warfarin with therapeutic INRs.  She does have some dependent rubor in both of her legs. I was able to palpate a decent dorsalis pedis pulse on the right foot but not on the left. She may have small vessel disease of the toes. It would be reasonable to consider arterial Dopplers.  [name]. She recently has been having problems with gallstones and underwent a cholecystectomy. Postoperatively she developed pain and has been on narcotics. She has not had a bowel movement in about 1-1/2 weeks. She reports significant pain and distention of the abdomen which is firm. She did hold her warfarin for the procedure and has been restarted. Her INR is subtherapeutic today and will require further adjustment.  08/14/2015  Heather [name] returns today for follow-up. She denies any worsening shortness of breath in fact she feels like her breathing is at  baseline. Most events related to COPD. Blood pressure is well-controlled today. Her INR slightly subtherapeutic at 1.7. Adjustments were made to warfarin. We did discuss alternatives to warfarin including the novel oral anticoagulants. She reported due to cost issues she feels more comfortable staying on warfarin. In general her INR seem to be fairly well-controlled although she does have some periods of subtherapeutic values.  09/19/2016  Heather [name] was seen today in follow-up. She reports she recently injured her left shin. She does not recall striking it on anything however appears to have had an avulsion injury which was difficult to control regarding the bleeding. Blood pressure is well-controlled today. Her weight is still on the low end of normal. She has significant dependent rubor of the lower extremities she says which improves completely when she elevates her feet and she has compression stockings at home but does not wear them regularly. She has subsequently switched from warfarin over to low-dose Eliquis for stroke prevention. She denies any chest pain or worsening shortness of breath. She is currently living with her [place] in [place]. The only other medical thing that she's had a recent compression fracture of the lumbar spine which is quite painful but ultimately has healed.  Current Outpatient Prescriptions  Medication Sig Dispense Refill  . albuterol (PROVENTIL HFA;VENTOLIN HFA) 108 (90 BASE) MCG/ACT inhaler Inhale 2 puffs into the lungs every 6 (six) hours as needed for wheezing or shortness of breath. 1 Inhaler 2  .  amitriptyline (ELAVIL) 25 MG tablet Take 25 mg by mouth at bedtime.    Marland Kitchen apixaban (ELIQUIS) 2.5 MG TABS tablet Take 1 tablet (2.5 mg total) by mouth 2 (two) times daily. 60 tablet 5  . BIOGAIA PROBIOTIC (BIOGAIA PROBIOTIC) LIQD Take by mouth daily at 8 pm.    . Budesonide-Formoterol Fumarate (SYMBICORT IN) Inhale into the lungs 2 (two) times daily.    .  calcium citrate-vitamin D (CITRACAL+D) 315-200 MG-UNIT per tablet Take 1 tablet by mouth daily.    . diazepam (VALIUM) 2 MG tablet Take 1 tablet (2 mg total) by mouth every 8 (eight) hours as needed for muscle spasms. 15 tablet 0  . diltiazem (CARDIZEM CD) 180 MG 24 hr capsule Take 180 mg by mouth daily.    . hydrochlorothiazide (HYDRODIURIL) 25 MG tablet Take 1 tablet (25 mg total) by mouth daily. 90 tablet 3  . HYDROcodone-acetaminophen (NORCO/VICODIN) 5-325 MG tablet Take 1-2 tablets by mouth every 4 (four) hours as needed. 15 tablet 0  . levothyroxine (SYNTHROID, LEVOTHROID) 137 MCG tablet Take 137 mcg by mouth daily before breakfast.     . lidocaine (LIDODERM) 5 % Place 1 patch onto the skin daily. Remove & Discard patch within 12 hours or as directed by MD 20 patch 0  . losartan (COZAAR) 100 MG tablet TAKE 1 TABLET BY MOUTH EVERY DAY KEEP OFFICE VISIT 90 tablet 3  . Multiple Vitamins-Minerals (MULTIVITAMIN WITH MINERALS) tablet Take 1 tablet by mouth daily.    . naphazoline-glycerin (CLEAR EYES) 0.012-0.2 % SOLN Place 1-2 drops into both eyes daily.     Marland Kitchen oxyCODONE-acetaminophen (PERCOCET/ROXICET) 5-325 MG tablet Take 1-2 tablets by mouth every 4 (four) hours as needed for severe pain. 20 tablet 0  . polyethylene glycol (MIRALAX / GLYCOLAX) packet Take 17 g by mouth daily as needed for moderate constipation or severe constipation.    . predniSONE (DELTASONE) 10 MG tablet Take 4 tabs for 3 days, then 3 tabs for 3 days, then 2 tabs for 3 days, then 1 tab for 3 days, then 1/2 tab for 4 days. 32 tablet 0  . promethazine-codeine (PHENERGAN WITH CODEINE) 6.25-10 MG/5ML syrup Take 5 mLs by mouth every 6 (six) hours as needed for cough. 120 mL 0   No current facility-administered medications for this visit.     Allergies  Allergen Reactions  . Ace Inhibitors Cough  . Sulfur Nausea And Vomiting    Social History   Social History  . Marital status: Married    Spouse name: N/A  . Number of  children: 2  . Years of education: N/A   Occupational History  . Retired Astronomer    Social History Main Topics  . Smoking status: Former Smoker    Packs/day: 1.00    Years: 20.00    Types: Cigarettes    Quit date: 09/11/1988  . Smokeless tobacco: Never Used  . Alcohol use 0.0 oz/week     Comment: occ  . Drug use: No  . Sexual activity: Yes   Other Topics Concern  . Not on file   Social History Narrative  . No narrative on file     Review of Systems: Pertinent items noted in HPI and remainder of comprehensive ROS otherwise negative.  Blood pressure 128/90, pulse 88, height '5\' 3"'  (1.6 m), weight 106 lb (48.1 kg).  General appearance: alert and no distress Neck: no carotid bruit and no JVD Lungs: clear to auscultation bilaterally Heart: regular rate and rhythm, S1, S2  normal, no murmur, click, rub or gallop Abdomen: soft, non-tender; bowel sounds normal; no masses,  no organomegaly Extremities: extremities normal, atraumatic, no cyanosis or edema Pulses: 2+ and symmetric Skin: Dependent rubor of both feet Neurologic: Grossly normal Psych: Mildly anxious  EKG  Normal sinus rhythm at 88, possible LAE-personally reviewed  ASSESSMENT AND PLAN:   Patient Active Problem List   Diagnosis Date Noted  . Influenza-like illness 04/16/2016  . Sepsis ([place]) 04/16/2016  . Hypokalemia 04/16/2016  . Hypothyroidism 04/16/2016  . Post herpetic neuralgia 06/06/2014  . Shingles outbreak 04/19/2014  . Gallstones 09/20/2013  . Community acquired pneumonia 05/17/2013  . COPD (chronic obstructive pulmonary disease) ([place]) 05/17/2013  . Dyspnea on exertion 04/25/2013  . Pulmonary emphysema ([place]) 11/15/2012  . HTN (hypertension) 11/15/2012  . PAF (paroxysmal atrial fibrillation) ([place]) 06/14/2012  . Long term current use of anticoagulant therapy 06/14/2012  . Ventral hernia 07/31/2011  . Abdominal wall mass 07/01/2011   PLAN: 1.  Heather [name] to be doing well although had a  recent injury to left anterior shin. I examine that today and redressed with a nonstick dressing. I advised her to hold direct pressure for longer periods of time to help stop bleeding while on Eliquis. She seems to be doing well otherwise on Eliquis. She denies any recurrent PAF. She denies any chest pain. Her COPD seems fairly stable. She had a recent compression fracture of the low back which she is healing from. Follow-up with me annually or sooner as necessary.  Pixie Casino, MD, [facility] Attending Cardiologist [facility] 09/19/2016 1:33 PM

## 2016-09-19 NOTE — Patient Instructions (Signed)
Dr Debara Pickett recommends that you schedule a follow-up appointment in 12 months. You will receive a reminder letter in the mail two months in advance. If you don't receive a letter, please call our office to schedule the follow-up appointment.  If you need a refill on your cardiac medications before your next appointment, please call your pharmacy.

## 2016-10-22 DIAGNOSIS — H04123 Dry eye syndrome of bilateral lacrimal glands: Secondary | ICD-10-CM | POA: Diagnosis not present

## 2016-10-22 DIAGNOSIS — H5203 Hypermetropia, bilateral: Secondary | ICD-10-CM | POA: Diagnosis not present

## 2016-10-22 DIAGNOSIS — Z961 Presence of intraocular lens: Secondary | ICD-10-CM | POA: Diagnosis not present

## 2016-11-27 ENCOUNTER — Other Ambulatory Visit: Payer: Self-pay | Admitting: Internal Medicine

## 2016-12-08 DIAGNOSIS — I10 Essential (primary) hypertension: Secondary | ICD-10-CM | POA: Diagnosis not present

## 2016-12-08 DIAGNOSIS — J449 Chronic obstructive pulmonary disease, unspecified: Secondary | ICD-10-CM | POA: Diagnosis not present

## 2016-12-08 DIAGNOSIS — G4700 Insomnia, unspecified: Secondary | ICD-10-CM | POA: Diagnosis not present

## 2016-12-08 DIAGNOSIS — Z90710 Acquired absence of both cervix and uterus: Secondary | ICD-10-CM | POA: Diagnosis not present

## 2016-12-08 DIAGNOSIS — I4891 Unspecified atrial fibrillation: Secondary | ICD-10-CM | POA: Diagnosis not present

## 2016-12-08 DIAGNOSIS — Z681 Body mass index (BMI) 19 or less, adult: Secondary | ICD-10-CM | POA: Diagnosis not present

## 2016-12-08 DIAGNOSIS — E039 Hypothyroidism, unspecified: Secondary | ICD-10-CM | POA: Diagnosis not present

## 2016-12-08 DIAGNOSIS — Z87891 Personal history of nicotine dependence: Secondary | ICD-10-CM | POA: Diagnosis not present

## 2016-12-29 ENCOUNTER — Telehealth: Payer: Self-pay | Admitting: Internal Medicine

## 2016-12-29 DIAGNOSIS — Z79899 Other long term (current) drug therapy: Secondary | ICD-10-CM

## 2016-12-29 MED ORDER — FUROSEMIDE 20 MG PO TABS: 20.0000 mg | ORAL_TABLET | Freq: Every day | ORAL | 3 refills | Status: DC | PRN

## 2016-12-29 NOTE — Telephone Encounter (Signed)
°  New Prob  Pt c/o swelling: STAT is pt has developed SOB within 24 hours  1) How much weight have you gained and in what time span? No  2) If swelling, where is the swelling located? Legs bilaterally  3) Are you currently taking a fluid pill? Yes  4) Are you currently SOB? She has SOB with exertion  5) Do you have a log of your daily weights (if so, list)? Yes, pt weighs herself every day  6) Have you gained 3 pounds in a day or 5 pounds in a week? No  7) Have you traveled recently? Yes, [place] the [place] last [place]

## 2016-12-29 NOTE — Telephone Encounter (Signed)
Patient called with MD instructions and voiced understanding. Rx(s) sent to pharmacy electronically. BMET ordered and patient instructed to have lab work mid next week. Aware a scheduler will call her to arrange PA or MD follow up

## 2016-12-29 NOTE — Telephone Encounter (Signed)
Returned call to patient of Dr. Debara Pickett who c/o swelling. She has had bilateral leg, feet, ankle swelling for about 3 weeks. She went on vacation last week. Patient weighs daily but her weight varies about 3lbs from one day to the next (between 105-107lbs). Patient reports SOB w/exertion, relieved w/rest - this is the "same". Patient has avoid fried foods, salty foods, and sodium. Patient does not monitor home BP.   Med list up to date.   Routed to DOD to review and advise

## 2016-12-29 NOTE — Telephone Encounter (Signed)
Lasix 20 mg daily as needed; bmet one week with results to Dr Debara Pickett; fu Dr Leonides Cave

## 2016-12-30 DIAGNOSIS — L600 Ingrowing nail: Secondary | ICD-10-CM | POA: Diagnosis not present

## 2016-12-30 DIAGNOSIS — M79674 Pain in right toe(s): Secondary | ICD-10-CM | POA: Diagnosis not present

## 2017-01-07 ENCOUNTER — Ambulatory Visit (INDEPENDENT_AMBULATORY_CARE_PROVIDER_SITE_OTHER): Payer: Medicare HMO | Admitting: Physician Assistant

## 2017-01-07 ENCOUNTER — Encounter: Payer: Self-pay | Admitting: Physician Assistant

## 2017-01-07 VITALS — BP 131/82 | HR 80 | Ht 63.0 in | Wt 105.4 lb

## 2017-01-07 DIAGNOSIS — Z5181 Encounter for therapeutic drug level monitoring: Secondary | ICD-10-CM | POA: Diagnosis not present

## 2017-01-07 DIAGNOSIS — I480 Paroxysmal atrial fibrillation: Secondary | ICD-10-CM | POA: Diagnosis not present

## 2017-01-07 DIAGNOSIS — R600 Localized edema: Secondary | ICD-10-CM | POA: Diagnosis not present

## 2017-01-07 DIAGNOSIS — I10 Essential (primary) hypertension: Secondary | ICD-10-CM | POA: Diagnosis not present

## 2017-01-07 DIAGNOSIS — R0989 Other specified symptoms and signs involving the circulatory and respiratory systems: Secondary | ICD-10-CM

## 2017-01-07 DIAGNOSIS — E039 Hypothyroidism, unspecified: Secondary | ICD-10-CM | POA: Diagnosis not present

## 2017-01-07 NOTE — Patient Instructions (Addendum)
Medication Instructions:  START TAKING YOUR LASIX (FUROSEMIDE) 20 MG DAILY   STOP HYDROCHLOROTHIAZIDE   Labwork: BMET TODAY   Testing/Procedures: Your physician has requested that you have an echocardiogram. Echocardiography is a painless test that uses sound waves to create images of your heart. It provides your doctor with information about the size and shape of your heart and how well your heart's chambers and valves are working. This procedure takes approximately one hour. There are no restrictions for this procedure. [address]  Your physician has requested that you have an ankle brachial index (ABI). During this test an ultrasound and blood pressure cuff are used to evaluate the arteries that supply the arms and legs with blood. Allow thirty minutes for this exam. There are no restrictions or special instructions.  Follow-Up: Your physician recommends that you schedule a follow-up appointment in: 3-4 WEEKS   Any Other Special Instructions Will Be Listed Below (If Applicable).  Heart Failure Prevention Instruction: 1. Leg elevation at night 2. Compression stocking during the day (need to hold off on the right side due to nail removal and healing right toe 3. Avoid sailt 4. Limit daily fluid intake to between 32-64 oz 5. Weigh yourself daily, call cardiology if weight increase by more than 3 lbs overnight or 5 lbs in a single week.

## 2017-01-07 NOTE — Progress Notes (Signed)
Cardiology Office Note    Date:  01/07/2017   ID:  Marguriete, Wootan Jul 13, 1926, MRN [id number]  PCP:  Burnard Bunting, MD  Cardiologist:  Dr. Debara Pickett   Chief Complaint  Patient presents with  . Follow-up    seen for Dr. Debara Pickett    History of Present Illness:  CHASELYN NANNEY is a 83 y.o. female with PMH of emphysema, PAF on eliquis, HTN, GERD, hiatal hernia, hypothyroidism and venous insufficiency. Echocardiogram in 2013 showed EF greater than 55%, normal left atrial size, normal RVF. She was switched recently from Coumadin to low-dose eliquis for stroke prevention. She was last seen on 09/19/2016 at which time she did have avulsion injury, but otherwise doing fine from cardiology perspective. She recently called our office on 12/29/2016 complaining of swelling, she was given as needed dose of Lasix, she presents today for follow-up.  She presents today for cardiology office evaluation. Since starting on Lasix last week, she has been taking Lasix on a daily basis essentially. Her lower extremity edema has significantly improved. I will obtain a basic metabolic panel today as she has a history of hypokalemia and she is currently on both Lasix and hydrochlorothiazide. She likely will require a more scheduled dose of Lasix. I will discontinue hydrochlorothiazide. I have a hard time feeling for lower extremity pulse, she says she has pain in the leg both at rest and also when she ambulated. I will obtain ABI. Previous ABI in 2015 was normal. I will also obtain an echocardiogram given the volume accumulation recently. Otherwise her lung is clear, she does not have a significant JVD. Her lower extremity edema is resolving. I recommended conservative management with leg elevation during the night and also compression stocking during the day. She may have to hold off on compression stocking in the right lower extremity due to open wound after toe nail removal procedure last week. I advised her to hold  off on putting on the compression stocking on that side on until toe is healed. Otherwise I will see her back in 3-4 weeks for reassessment.   Past Medical History:  Diagnosis Date  . Anxiety   . Arthritis   . Atrial fibrillation ([place])   . COPD (chronic obstructive pulmonary disease) ([place])   . Dysrhythmia    AF  . Essential hypertension, benign   . Gallstones   . GERD (gastroesophageal reflux disease)   . H/O hiatal hernia   . History of nuclear stress test 10/2010   dipyridamole; normal pattern of perfusion; low risk, normal study  . 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 Mild aortic insufficiency   . Osteoporosis   . Peripheral neuropathy   . Pneumonia    states she had it twice, last time a couple of months ago  . PONV (postoperative nausea and vomiting)   . Shortness of breath    with exertion  . Unspecified hypothyroidism   . Venous insufficiency     Past Surgical History:  Procedure Laterality Date  . APPENDECTOMY  1935  . BACK SURGERY     x2  . Cardiometablic Testing  8/84/1660   submaximal effort with peak RER of 0.5, peak VO2 79% predicted; HR peak up to 78%; PVC was 55% predicted, PEV1 39% predicted; PEV1/VC ratio was reduced; normal vital capacity; DLCO was reduced to 63%  . CARPAL TUNNEL RELEASE    . CATARACT EXTRACTION W/ INTRAOCULAR LENS  IMPLANT, BILATERAL    . CHOLECYSTECTOMY  04/07/2014  dr Marlou Starks  . CHOLECYSTECTOMY N/A 04/07/2014   Procedure: LAPAROSCOPIC CHOLECYSTECTOMY WITH INTRAOPERATIVE CHOLANGIOGRAM POSSBILE OPEN;  Surgeon: Autumn Messing III, MD;  Location: [facility];  Service: General;  Laterality: N/A;  . COLON SURGERY  2008   partial  . HERNIA REPAIR    . JOINT REPLACEMENT    . left hip relacement  2000  . TONSILLECTOMY  1935  . TOTAL ABDOMINAL HYSTERECTOMY  1970  . TRANSTHORACIC ECHOCARDIOGRAM  05/2011   EF=>55%; mild conc LVH; mild mitral annular calcif; mild TR; AV mildly sclerotic & mild AR  . VENTRAL HERNIA  REPAIR  09/19/2011   Procedure: LAPAROSCOPIC VENTRAL HERNIA;  Surgeon: Merrie Roof, MD;  Location: [facility];  Service: General;  Laterality: N/A;  laparoscopic ventral hernia repair with mesh    Current Medications: Outpatient Medications Prior to Visit  Medication Sig Dispense Refill  . albuterol (PROVENTIL HFA;VENTOLIN HFA) 108 (90 BASE) MCG/ACT inhaler Inhale 2 puffs into the lungs every 6 (six) hours as needed for wheezing or shortness of breath. 1 Inhaler 2  . BIOGAIA PROBIOTIC (BIOGAIA PROBIOTIC) LIQD Take by mouth daily at 8 pm.    . Budesonide-Formoterol Fumarate (SYMBICORT IN) Inhale into the lungs 2 (two) times daily.    . calcium citrate-vitamin D (CITRACAL+D) 315-200 MG-UNIT per tablet Take 1 tablet by mouth daily.    Marland Kitchen diltiazem (CARDIZEM CD) 180 MG 24 hr capsule Take 180 mg by mouth daily.    Marland Kitchen ELIQUIS 2.5 MG TABS tablet TAKE 1 TABLET BY MOUTH TWICE DAILY 60 tablet 5  . levothyroxine (SYNTHROID, LEVOTHROID) 137 MCG tablet Take 137 mcg by mouth daily before breakfast.     . losartan (COZAAR) 100 MG tablet TAKE 1 TABLET BY MOUTH EVERY DAY KEEP OFFICE VISIT 90 tablet 3  . Multiple Vitamins-Minerals (MULTIVITAMIN WITH MINERALS) tablet Take 1 tablet by mouth daily.    . naphazoline-glycerin (CLEAR EYES) 0.012-0.2 % SOLN Place 1-2 drops into both eyes daily.     . polyethylene glycol (MIRALAX / GLYCOLAX) packet Take 17 g by mouth daily as needed for moderate constipation or severe constipation.    . promethazine-codeine (PHENERGAN WITH CODEINE) 6.25-10 MG/5ML syrup Take 5 mLs by mouth every 6 (six) hours as needed for cough. 120 mL 0  . amitriptyline (ELAVIL) 25 MG tablet Take 25 mg by mouth at bedtime.    . furosemide (LASIX) 20 MG tablet Take 1 tablet (20 mg total) by mouth daily as needed for edema. 30 tablet 3  . hydrochlorothiazide (HYDRODIURIL) 25 MG tablet Take 1 tablet (25 mg total) by mouth daily. 90 tablet 3  . diazepam (VALIUM) 2 MG tablet Take 1 tablet (2 mg total) by mouth  every 8 (eight) hours as needed for muscle spasms. 15 tablet 0  . HYDROcodone-acetaminophen (NORCO/VICODIN) 5-325 MG tablet Take 1-2 tablets by mouth every 4 (four) hours as needed. 15 tablet 0  . lidocaine (LIDODERM) 5 % Place 1 patch onto the skin daily. Remove & Discard patch within 12 hours or as directed by MD 20 patch 0  . oxyCODONE-acetaminophen (PERCOCET/ROXICET) 5-325 MG tablet Take 1-2 tablets by mouth every 4 (four) hours as needed for severe pain. 20 tablet 0  . predniSONE (DELTASONE) 10 MG tablet Take 4 tabs for 3 days, then 3 tabs for 3 days, then 2 tabs for 3 days, then 1 tab for 3 days, then 1/2 tab for 4 days. 32 tablet 0   No facility-administered medications prior to visit.  Allergies:   Ace inhibitors and Sulfur   Social History   Social History  . Marital status: Married    Spouse name: N/A  . Number of children: 2  . Years of education: N/A   Occupational History  . Retired Astronomer    Social History Main Topics  . Smoking status: Former Smoker    Packs/day: 1.00    Years: 20.00    Types: Cigarettes    Quit date: 09/11/1988  . Smokeless tobacco: Never Used  . Alcohol use 0.0 oz/week     Comment: occ  . Drug use: No  . Sexual activity: Yes   Other Topics Concern  . None   Social History Narrative  . None     Family History:  The patient's family history includes Bladder Cancer in her brother; Diabetes in her brother; Heart block in her brother; Stroke in her brother, father, and mother.   ROS:   Please see the history of present illness.    ROS All other systems reviewed and are negative.   PHYSICAL EXAM:   VS:  BP 131/82   Pulse 80   Ht 5\' 3"  (1.6 m)   Wt 105 lb 6.4 oz (47.8 kg)   BMI 18.67 kg/m    GEN: Well nourished, well developed, in no acute distress  HEENT: normal  Neck: no JVD, carotid bruits, or masses Cardiac: RRR; no murmurs, rubs, or gallops,no edema  Respiratory:  clear to auscultation bilaterally, normal work of  breathing GI: soft, nontender, nondistended, + BS MS: no deformity or atrophy  Skin: warm and dry, no rash Neuro:  Alert and Oriented x 3, Strength and sensation are intact Psych: euthymic mood, full affect  Wt Readings from Last 3 Encounters:  01/07/17 105 lb 6.4 oz (47.8 kg)  09/19/16 106 lb (48.1 kg)  08/08/16 104 lb 9.6 oz (47.4 kg)      Studies/Labs Reviewed:   EKG:  EKG is not ordered today.    Recent Labs: 04/17/2016: ALT 17 06/23/2016: BUN 16; Creatinine, Ser 0.58; Hemoglobin 12.7; Platelets 309; Potassium 3.1; Sodium 135   Lipid Panel No results found for: CHOL, TRIG, HDL, CHOLHDL, VLDL, LDLCALC, LDLDIRECT  Additional studies/ records that were reviewed today include:   LE ABI 11/25/2013    ASSESSMENT:    1. Lower extremity edema   2. Decreased pedal pulses   3. Therapeutic drug monitoring   4. PAF (paroxysmal atrial fibrillation) ([place])   5. Essential hypertension   6. Hypothyroidism, unspecified type      PLAN:  In order of problems listed above:  1. Lower extremity edema: Lower extremity edema has improved after she has been taking Lasix 20 mg daily for the past week. She is also on HCTZ as well, she has a history of hypokalemia, I will discontinue her HCTZ to avoid significant drop in the potassium. I recommended conservative management with leg elevation and compression stocking for residual edema. She still have a little edema near the ankle, however it is much improved.  2. Lower extremity pain: Previous ABI normal in 2015, she has been complaining of lower extremity pain. It was difficult to feel her pulse, I will obtain repeat ABI.  3. PAF on eliquis: Rate controlled.  4. Hypertension: Blood pressure well controlled, we'll continue to observe blood pressure after discontinuation of HCTZ.  5. Hypothyroidism: Managed by primary care provider, on Synthroid    Medication Adjustments/Labs and Tests Ordered: Current medicines are reviewed at length  with the  patient today.  Concerns regarding medicines are outlined above.  Medication changes, Labs and Tests ordered today are listed in the Patient Instructions below. Patient Instructions  Medication Instructions:  START TAKING YOUR LASIX (FUROSEMIDE) 20 MG DAILY   STOP HYDROCHLOROTHIAZIDE   Labwork: BMET TODAY   Testing/Procedures: Your physician has requested that you have an echocardiogram. Echocardiography is a painless test that uses sound waves to create images of your heart. It provides your doctor with information about the size and shape of your heart and how well your heart's chambers and valves are working. This procedure takes approximately one hour. There are no restrictions for this procedure. [address]  Your physician has requested that you have an ankle brachial index (ABI). During this test an ultrasound and blood pressure cuff are used to evaluate the arteries that supply the arms and legs with blood. Allow thirty minutes for this exam. There are no restrictions or special instructions.  Follow-Up: Your physician recommends that you schedule a follow-up appointment in: 3-4 WEEKS   Any Other Special Instructions Will Be Listed Below (If Applicable).  Heart Failure Prevention Instruction: 1. Leg elevation at night 2. Compression stocking during the day (need to hold off on the right side due to nail removal and healing right toe 3. Avoid sailt 4. Limit daily fluid intake to between 32-64 oz 5. Weigh yourself daily, call cardiology if weight increase by more than 3 lbs overnight or 5 lbs in a single week.       Hilbert Corrigan, [place]  01/07/2017 10:18 PM    [facility] [place], [place], [place]  [address] Phone: [phone]; Fax: [phone]

## 2017-01-08 LAB — BASIC METABOLIC PANEL
BUN/Creatinine Ratio: 33 — ABNORMAL HIGH (ref 12–28)
BUN: 23 mg/dL (ref 8–27)
CHLORIDE: 94 mmol/L — AB (ref 96–106)
CO2: 23 mmol/L (ref 20–29)
CREATININE: 0.7 mg/dL (ref 0.57–1.00)
Calcium: 9.8 mg/dL (ref 8.7–10.3)
GFR calc non Af Amer: 78 mL/min/{1.73_m2} (ref >59)
GFR, EST AFRICAN AMERICAN: 90 mL/min/{1.73_m2} (ref >59)
GLUCOSE: 92 mg/dL (ref 65–99)
Potassium: 4.2 mmol/L (ref 3.5–5.2)
Sodium: 139 mmol/L (ref 134–144)

## 2017-01-19 ENCOUNTER — Other Ambulatory Visit: Payer: Self-pay | Admitting: Physician Assistant

## 2017-01-19 DIAGNOSIS — R600 Localized edema: Secondary | ICD-10-CM

## 2017-01-19 DIAGNOSIS — R0989 Other specified symptoms and signs involving the circulatory and respiratory systems: Secondary | ICD-10-CM

## 2017-01-21 ENCOUNTER — Other Ambulatory Visit: Payer: Self-pay

## 2017-01-21 ENCOUNTER — Ambulatory Visit (HOSPITAL_COMMUNITY): Payer: Medicare HMO | Attending: Cardiovascular Disease

## 2017-01-21 DIAGNOSIS — I082 Rheumatic disorders of both aortic and tricuspid valves: Secondary | ICD-10-CM | POA: Insufficient documentation

## 2017-01-21 DIAGNOSIS — I10 Essential (primary) hypertension: Secondary | ICD-10-CM | POA: Insufficient documentation

## 2017-01-21 DIAGNOSIS — J449 Chronic obstructive pulmonary disease, unspecified: Secondary | ICD-10-CM | POA: Diagnosis not present

## 2017-01-21 DIAGNOSIS — Z8249 Family history of ischemic heart disease and other diseases of the circulatory system: Secondary | ICD-10-CM | POA: Insufficient documentation

## 2017-01-21 DIAGNOSIS — I4891 Unspecified atrial fibrillation: Secondary | ICD-10-CM | POA: Insufficient documentation

## 2017-01-21 DIAGNOSIS — R600 Localized edema: Secondary | ICD-10-CM | POA: Diagnosis not present

## 2017-01-21 DIAGNOSIS — Z87891 Personal history of nicotine dependence: Secondary | ICD-10-CM | POA: Insufficient documentation

## 2017-01-26 ENCOUNTER — Ambulatory Visit (HOSPITAL_COMMUNITY)
Admission: RE | Admit: 2017-01-26 | Discharge: 2017-01-26 | Disposition: A | Discharge status: Home / Self Care | Payer: Medicare HMO | Source: Ambulatory Visit | Attending: Cardiovascular Disease | Admitting: Cardiovascular Disease

## 2017-01-26 DIAGNOSIS — R9389 Abnormal findings on diagnostic imaging of other specified body structures: Secondary | ICD-10-CM | POA: Diagnosis not present

## 2017-01-26 DIAGNOSIS — R600 Localized edema: Secondary | ICD-10-CM | POA: Diagnosis not present

## 2017-01-26 DIAGNOSIS — R0989 Other specified symptoms and signs involving the circulatory and respiratory systems: Secondary | ICD-10-CM | POA: Diagnosis not present

## 2017-01-29 NOTE — Progress Notes (Signed)
ABI normal, no sign of significant blockage.

## 2017-02-05 ENCOUNTER — Ambulatory Visit: Payer: Medicare HMO | Admitting: Physician Assistant

## 2017-02-05 ENCOUNTER — Encounter: Payer: Self-pay | Admitting: Physician Assistant

## 2017-02-05 VITALS — BP 131/73 | HR 79 | Ht 63.0 in | Wt 106.0 lb

## 2017-02-05 DIAGNOSIS — I480 Paroxysmal atrial fibrillation: Secondary | ICD-10-CM | POA: Diagnosis not present

## 2017-02-05 DIAGNOSIS — M79604 Pain in right leg: Secondary | ICD-10-CM | POA: Diagnosis not present

## 2017-02-05 DIAGNOSIS — M7989 Other specified soft tissue disorders: Secondary | ICD-10-CM

## 2017-02-05 DIAGNOSIS — E039 Hypothyroidism, unspecified: Secondary | ICD-10-CM

## 2017-02-05 DIAGNOSIS — I10 Essential (primary) hypertension: Secondary | ICD-10-CM

## 2017-02-05 NOTE — Progress Notes (Signed)
Cardiology Office Note    Date:  02/05/2017   ID:  Heather, [name] 1926/07/22, MRN [id number]  PCP:  Burnard Bunting, MD  Cardiologist:  Dr. Debara Pickett  Chief Complaint  Patient presents with  . Follow-up    seen for Dr. Debara Pickett, LE edema    History of Present Illness:  Heather [name] is a 83 y.o. female with PMH of emphysema, PAF on eliquis, HTN, GERD, hiatal hernia, hypothyroidism and venous insufficiency. Echocardiogram in 2013 showed EF greater than 55%, normal left atrial size, normal RVF. She was switched recently from Coumadin to low-dose eliquis for stroke prevention. She recently called our office on 12/29/2016 complaining of swelling, she was given as needed dose of Lasix, she presents today for follow-up.  I saw the patient on 01/07/2017, she was essentially on daily doses of Lasix. Her lower extremity edema has improved as well. I discontinued hydrochlorothiazide and continued on daily dose of Lasix. She was having pain in bilateral lower extremity, I obtained a ABI which came back normal. Echocardiogram obtained on 01/21/2014 showed EF 65-70%, grade 1 DD. Patient presents today for cardiology office visit. She continued to have right lower extremity pain in the calf area. Her right lower extremity also is a lot more swollen than the left lower extrem She has at least 3+ pitting edema in the right lower extremity and 2+ pitting edema in the left lower extremity. She has a open draining wound in the RLE as well. I have increased her diuretic to 40 mg daily of Lasix. She will need basic metabolic panel today and also in one week. Although the probability of her having DVT is relatively low on eliquis, however I am concerned about eliquis failure, I will obtain a right lower extremity venous Doppler. She can follow-up with me in 3-4 weeks for medication titration. I think some of her pain and drainage is likely related to the significant edema in the leg. Otherwise she does not have any  significant chest discomfort. She is elevating her legs at night. She will not tolerate compression stocking at this time due to open wound.   Past Medical History:  Diagnosis Date  . Anxiety   . Arthritis   . Atrial fibrillation ([place])   . COPD (chronic obstructive pulmonary disease) ([place])   . Dysrhythmia    AF  . Essential hypertension, benign   . Gallstones   . GERD (gastroesophageal reflux disease)   . H/O hiatal hernia   . History of nuclear stress test 10/2010   dipyridamole; normal pattern of perfusion; low risk, normal study  . 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 Mild aortic insufficiency   . Osteoporosis   . Peripheral neuropathy   . Pneumonia    states she had it twice, last time a couple of months ago  . PONV (postoperative nausea and vomiting)   . Shortness of breath    with exertion  . Unspecified hypothyroidism   . Venous insufficiency     Past Surgical History:  Procedure Laterality Date  . APPENDECTOMY  1935  . BACK SURGERY     x2  . Cardiometablic Testing  6/57/8469   submaximal effort with peak RER of 0.5, peak VO2 79% predicted; HR peak up to 78%; PVC was 55% predicted, PEV1 39% predicted; PEV1/VC ratio was reduced; normal vital capacity; DLCO was reduced to 63%  . CARPAL TUNNEL RELEASE    . CATARACT EXTRACTION W/ INTRAOCULAR LENS  IMPLANT, BILATERAL    .  CHOLECYSTECTOMY  04/07/2014   dr toth  . COLON SURGERY  2008   partial  . HERNIA REPAIR    . JOINT REPLACEMENT    . left hip relacement  2000  . TONSILLECTOMY  1935  . TOTAL ABDOMINAL HYSTERECTOMY  1970  . TRANSTHORACIC ECHOCARDIOGRAM  05/2011   EF=>55%; mild conc LVH; mild mitral annular calcif; mild TR; AV mildly sclerotic & mild AR    Current Medications: Outpatient Medications Prior to Visit  Medication Sig Dispense Refill  . albuterol (PROVENTIL HFA;VENTOLIN HFA) 108 (90 BASE) MCG/ACT inhaler Inhale 2 puffs into the lungs every 6 (six) hours as needed  for wheezing or shortness of breath. 1 Inhaler 2  . BIOGAIA PROBIOTIC (BIOGAIA PROBIOTIC) LIQD Take by mouth daily at 8 pm.    . Budesonide-Formoterol Fumarate (SYMBICORT IN) Inhale into the lungs 2 (two) times daily.    . calcium citrate-vitamin D (CITRACAL+D) 315-200 MG-UNIT per tablet Take 1 tablet by mouth daily.    Marland Kitchen diltiazem (CARDIZEM CD) 180 MG 24 hr capsule Take 180 mg by mouth daily.    Marland Kitchen ELIQUIS 2.5 MG TABS tablet TAKE 1 TABLET BY MOUTH TWICE DAILY 60 tablet 5  . furosemide (LASIX) 20 MG tablet Take 20 mg by mouth daily.    Marland Kitchen levothyroxine (SYNTHROID, LEVOTHROID) 137 MCG tablet Take 137 mcg by mouth daily before breakfast.     . losartan (COZAAR) 100 MG tablet TAKE 1 TABLET BY MOUTH EVERY DAY KEEP OFFICE VISIT 90 tablet 3  . Multiple Vitamins-Minerals (MULTIVITAMIN WITH MINERALS) tablet Take 1 tablet by mouth daily.    . naphazoline-glycerin (CLEAR EYES) 0.012-0.2 % SOLN Place 1-2 drops into both eyes daily.     . polyethylene glycol (MIRALAX / GLYCOLAX) packet Take 17 g by mouth daily as needed for moderate constipation or severe constipation.    . promethazine-codeine (PHENERGAN WITH CODEINE) 6.25-10 MG/5ML syrup Take 5 mLs by mouth every 6 (six) hours as needed for cough. 120 mL 0   No facility-administered medications prior to visit.      Allergies:   Ace inhibitors and Sulfur   Social History   Socioeconomic History  . Marital status: Married    Spouse name: None  . Number of children: 2  . Years of education: None  . Highest education level: None  Social Needs  . Financial resource strain: None  . Food insecurity - worry: None  . Food insecurity - inability: None  . Transportation needs - medical: None  . Transportation needs - non-medical: None  Occupational History  . Occupation: Retired Astronomer  Tobacco Use  . Smoking status: Former Smoker    Packs/day: 1.00    Years: 20.00    Pack years: 20.00    Types: Cigarettes    Last attempt to quit: 09/11/1988     Years since quitting: 28.4  . Smokeless tobacco: Never Used  Substance and Sexual Activity  . Alcohol use: Yes    Alcohol/week: 0.0 oz    Comment: occ  . Drug use: No  . Sexual activity: Yes  Other Topics Concern  . None  Social History Narrative  . None     Family History:  The patient's family history includes Bladder Cancer in her brother; Diabetes in her brother; Heart block in her brother; Stroke in her brother, father, and mother.   ROS:   Please see the history of present illness.    ROS All other systems reviewed and are negative.   PHYSICAL EXAM:  VS:  BP 131/73   Pulse 79   Ht 5\' 3"  (1.6 m)   Wt 106 lb (48.1 kg)   SpO2 93%   BMI 18.78 kg/m    GEN: Well nourished, well developed, in no acute distress  HEENT: normal  Neck: no JVD, carotid bruits, or masses Cardiac: RRR; no murmurs, rubs, or gallops. 2+ LLE pitting edema and 3+ RLE pitting edema. Open wound in RLE Respiratory:  clear to auscultation bilaterally, normal work of breathing GI: soft, nontender, nondistended, + BS MS: no deformity or atrophy  Skin: warm and dry, no rash Neuro:  Alert and Oriented x 3, Strength and sensation are intact Psych: euthymic mood, full affect  Wt Readings from Last 3 Encounters:  02/05/17 106 lb (48.1 kg)  01/07/17 105 lb 6.4 oz (47.8 kg)  09/19/16 106 lb (48.1 kg)      Studies/Labs Reviewed:   EKG:  EKG is not ordered today.    Recent Labs: 04/17/2016: ALT 17 06/23/2016: Hemoglobin 12.7; Platelets 309 01/07/2017: BUN 23; Creatinine, Ser 0.70; Potassium 4.2; Sodium 139   Lipid Panel No results found for: CHOL, TRIG, HDL, CHOLHDL, VLDL, LDLCALC, LDLDIRECT  Additional studies/ records that were reviewed today include:   Echo 01/21/2017 LV EF: 65% -   70%  Study Conclusions  - Left ventricle: The cavity size was normal. Wall thickness was   normal. Systolic function was vigorous. The estimated ejection   fraction was in the range of 65% to 70%. Wall motion  was normal;   there were no regional wall motion abnormalities. Doppler   parameters are consistent with abnormal left ventricular   relaxation (grade 1 diastolic dysfunction). - Aortic valve: Moderately calcified annulus. Moderately thickened,   moderately calcified leaflets. There was trivial regurgitation. - Mitral valve: Mildly to moderately calcified annulus.   ABI 01/26/2017 Final Interpretation: Right: Resting right ankle-brachial index indicates noncompressible right lower extremity arteries.The right toe-brachial index is normal.  Left: Resting left ankle-brachial index indicates noncompressible right lower extremity arteries.The left toe-brachial index is abnormal.   ASSESSMENT:    1. Pain and swelling of lower extremity, right   2. PAF (paroxysmal atrial fibrillation) ([place])   3. Essential hypertension   4. Hypothyroidism, unspecified type      PLAN:  In order of problems listed above:  1. Bilateral lower extremity edema: right lower extremity worse than left. Will obtain right lower extremity venous Doppler to rule out DVT. Also increase Lasix to 40 mg daily. Obtain basic metabolic panel today and also in one week. Although she has redness in bilateral feet, does not appear to be cellulitis, has been going on for years. Recent ABI norma  2. PAF on eliquis: CHA2DS2-Vasc score 4 (female, age, HTN). On low-dose eliquis due to age and weight  3. Hypertension: blood pressure very well controlled. I discontinued her HCTZ during the last office visit as she is also on Lasix.  4. Hypothyroidism: management primary care provider.    Medication Adjustments/Labs and Tests Ordered: Current medicines are reviewed at length with the patient today.  Concerns regarding medicines are outlined above.  Medication changes, Labs and Tests ordered today are listed in the Patient Instructions below. Patient Instructions  Medication Instructions:  INCREASE Lasix to 40 mg take 2(two) 20  mg tablets once a day    Labwork: Your physician recommends that you return for lab work in: TODAY-BMET  Your physician recommends that you return for lab work in: [place] BMET  Testing/Procedures: Your  physician has requested that you have a lower extremity venous duplex. This test is an ultrasound of the veins in the legs or arms. It looks at venous blood flow that carries blood from the heart to the legs or arms. Allow one hour for a Lower Venous exam. Allow thirty minutes for an Upper Venous exam. There are no restrictions or special instructions.  Follow-Up: Your physician recommends that you schedule a follow-up appointment in: 3-4 WEEKS WITH Musette Kisamore, PA-C  Any Other Special Instructions Will Be Listed Below (If Applicable).  If you need a refill on your cardiac medications before your next appointment, please call your pharmacy.     Hilbert Corrigan, [place]  02/05/2017 10:14 PM    [facility] [address], [place], [place]  [address] Phone: [phone]; Fax: [phone]

## 2017-02-05 NOTE — Patient Instructions (Signed)
Medication Instructions:  INCREASE Lasix to 40 mg take 2(two) 20 mg tablets once a day    Labwork: Your physician recommends that you return for lab work in: TODAY-BMET  Your physician recommends that you return for lab work in: [place] BMET  Testing/Procedures: Your physician has requested that you have a lower extremity venous duplex. This test is an ultrasound of the veins in the legs or arms. It looks at venous blood flow that carries blood from the heart to the legs or arms. Allow one hour for a Lower Venous exam. Allow thirty minutes for an Upper Venous exam. There are no restrictions or special instructions.  Follow-Up: Your physician recommends that you schedule a follow-up appointment in: 3-4 WEEKS WITH HAO MENG, PA-C  Any Other Special Instructions Will Be Listed Below (If Applicable).  If you need a refill on your cardiac medications before your next appointment, please call your pharmacy.

## 2017-02-06 LAB — BASIC METABOLIC PANEL
BUN/Creatinine Ratio: 22 (ref 12–28)
BUN: 17 mg/dL (ref 8–27)
CALCIUM: 10 mg/dL (ref 8.7–10.3)
CO2: 27 mmol/L (ref 20–29)
CREATININE: 0.76 mg/dL (ref 0.57–1.00)
Chloride: 101 mmol/L (ref 96–106)
GFR calc Af Amer: 82 mL/min/{1.73_m2} (ref >59)
GFR, EST NON AFRICAN AMERICAN: 71 mL/min/{1.73_m2} (ref >59)
Glucose: 98 mg/dL (ref 65–99)
Potassium: 4.6 mmol/L (ref 3.5–5.2)
Sodium: 145 mmol/L — ABNORMAL HIGH (ref 134–144)

## 2017-02-09 ENCOUNTER — Ambulatory Visit (HOSPITAL_COMMUNITY)
Admission: RE | Admit: 2017-02-09 | Discharge: 2017-02-09 | Disposition: A | Discharge status: Home / Self Care | Payer: Medicare HMO | Source: Ambulatory Visit | Attending: Cardiology | Admitting: Cardiology

## 2017-02-09 DIAGNOSIS — M7989 Other specified soft tissue disorders: Secondary | ICD-10-CM | POA: Diagnosis not present

## 2017-02-09 DIAGNOSIS — M79604 Pain in right leg: Secondary | ICD-10-CM | POA: Diagnosis not present

## 2017-02-11 NOTE — Progress Notes (Signed)
No blood clot noted in the leg

## 2017-02-12 DIAGNOSIS — M7989 Other specified soft tissue disorders: Secondary | ICD-10-CM | POA: Diagnosis not present

## 2017-02-12 DIAGNOSIS — M79604 Pain in right leg: Secondary | ICD-10-CM | POA: Diagnosis not present

## 2017-02-13 LAB — BASIC METABOLIC PANEL
BUN/Creatinine Ratio: 27 (ref 12–28)
BUN: 22 mg/dL (ref 8–27)
CALCIUM: 9.5 mg/dL (ref 8.7–10.3)
CO2: 29 mmol/L (ref 20–29)
Chloride: 99 mmol/L (ref 96–106)
Creatinine, Ser: 0.82 mg/dL (ref 0.57–1.00)
GFR calc Af Amer: 74 mL/min/{1.73_m2} (ref >59)
GFR calc non Af Amer: 65 mL/min/{1.73_m2} (ref >59)
GLUCOSE: 115 mg/dL — AB (ref 65–99)
POTASSIUM: 4 mmol/L (ref 3.5–5.2)
Sodium: 143 mmol/L (ref 134–144)

## 2017-03-03 ENCOUNTER — Ambulatory Visit: Payer: Medicare HMO | Admitting: Physician Assistant

## 2017-03-03 VITALS — BP 128/77 | HR 96 | Wt 107.6 lb

## 2017-03-03 DIAGNOSIS — I872 Venous insufficiency (chronic) (peripheral): Secondary | ICD-10-CM

## 2017-03-03 DIAGNOSIS — E039 Hypothyroidism, unspecified: Secondary | ICD-10-CM | POA: Diagnosis not present

## 2017-03-03 DIAGNOSIS — Z79899 Other long term (current) drug therapy: Secondary | ICD-10-CM | POA: Diagnosis not present

## 2017-03-03 DIAGNOSIS — L89899 Pressure ulcer of other site, unspecified stage: Secondary | ICD-10-CM

## 2017-03-03 DIAGNOSIS — R600 Localized edema: Secondary | ICD-10-CM

## 2017-03-03 DIAGNOSIS — M79604 Pain in right leg: Secondary | ICD-10-CM

## 2017-03-03 DIAGNOSIS — I480 Paroxysmal atrial fibrillation: Secondary | ICD-10-CM | POA: Diagnosis not present

## 2017-03-03 MED ORDER — FUROSEMIDE 40 MG PO TABS: 40.0000 mg | ORAL_TABLET | Freq: Two times a day (BID) | ORAL | 3 refills | Status: DC

## 2017-03-03 MED ORDER — POTASSIUM CHLORIDE ER 8 MEQ PO TBCR: 8.0000 meq | EXTENDED_RELEASE_TABLET | Freq: Two times a day (BID) | ORAL | 3 refills | Status: DC

## 2017-03-03 MED ORDER — GABAPENTIN 300 MG PO CAPS: 300.0000 mg | ORAL_CAPSULE | Freq: Every day | ORAL | 3 refills | Status: DC

## 2017-03-03 NOTE — Patient Instructions (Addendum)
Medication Instructions:   INCREASE Lasix to 40mg  TWICE DAILY START Potassium 48meq tablets TWICE DAILY  START Gabapentin (Neurontin) 300mg  ONCE DAILY  Labwork:   Please return to the [facility] office to have bloodwork (BMET) in ~5 days (Monday of next week). You will not need to fast for this test.   Our lab is open from 8:00am to 12pm and 1:30pm to 4:30pm. You do not need an appointment.  Testing/Procedures:  none  Follow-Up:  With Dr. Debara Pickett in 2 weeks (December 20th at 2:15pm)  If you need a refill on your cardiac medications before your next appointment, please call your pharmacy.

## 2017-03-03 NOTE — Progress Notes (Signed)
Cardiology Office Note    Date:  03/05/2017   ID:  Heather [name], Heather [name] [birth year], MRN [id number]  PCP:  Burnard Bunting, MD  Cardiologist:  Dr. Debara Pickett  Chief Complaint  Patient presents with  . Follow-up    seen for Dr. Debara Pickett    History of Present Illness:  Heather [name] is a 83 y.o. female  with PMH of emphysema, PAF oneliquis, HTN, GERD, hiatal hernia, hypothyroidism and venous insufficiency.Echocardiogram in 2013 showed EF greater than 55%, normal left atrial size, normal RVF.She was switched recently from Coumadin to low-dose eliquis for stroke prevention. She recently called our office on 12/29/2016 complaining of swelling, she was given as needed dose of Lasix, she presents today for follow-up.  I saw the patient in October 2018, her lower extremity edema improved on daily dosing of Lasix.  I discontinued her hydrochlorothiazide and continued her only on Lasix.  Lower extremity ABI was obtained due to bilateral pain, this came back normal.  Echocardiogram obtained on 01/21/2017 showed EF 65-70%, grade 1 DD, no significant valvular issue.  In the last office visit, she appears to be volume overloaded, I increased her diuretic to 40 mg daily.  Lower extremity venous Doppler was negative for DVT.  She presents today for reassessment of lower extremity edema.  Patient continued to have significant lower extremity edema, at least 2-3+ bilaterally.  She has significant right lower leg pain along with a open wound.  The wound appears to be dry without significant erythema or drainage.  I think it is healing, however advised the patient to have a evaluated by his primary care provider as well.  The lower extremity edema is likely preventing significant wound heal.  It is unclear to me why she continued to have this degree of edema, she says after her diuretic was increased to 40 mg daily, she did notice increased urinary output.  Despite this, her weight is unchanged, nor has she noticed  a significant difference in her lower extremity swelling.  I will increase her diuretic to 40 mg twice daily.  We will also start on low-dose potassium supplement as well.  She will need 1 week basic metabolic panel to assess renal function and electrolyte.  She has been complaining of significant shooting pain in the leg, I have given her gabapentin 300 mg daily as a trial.    Past Medical History:  Diagnosis Date  . Anxiety   . Arthritis   . Atrial fibrillation ([place])   . COPD (chronic obstructive pulmonary disease) ([place])   . Dysrhythmia    AF  . Essential hypertension, benign   . Gallstones   . GERD (gastroesophageal reflux disease)   . H/O hiatal hernia   . History of nuclear stress test 10/2010   dipyridamole; normal pattern of perfusion; low risk, normal study  . 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 Mild aortic insufficiency   . Osteoporosis   . Peripheral neuropathy   . Pneumonia    states she had it twice, last time a couple of months ago  . PONV (postoperative nausea and vomiting)   . Shortness of breath    with exertion  . Unspecified hypothyroidism   . Venous insufficiency     Past Surgical History:  Procedure Laterality Date  . APPENDECTOMY  1935  . BACK SURGERY     x2  . Cardiometablic Testing  5/39/7673   submaximal effort with peak RER of 0.5, peak VO2 79% predicted;  HR peak up to 78%; PVC was 55% predicted, PEV1 39% predicted; PEV1/VC ratio was reduced; normal vital capacity; DLCO was reduced to 63%  . CARPAL TUNNEL RELEASE    . CATARACT EXTRACTION W/ INTRAOCULAR LENS  IMPLANT, BILATERAL    . CHOLECYSTECTOMY  04/07/2014   dr toth  . CHOLECYSTECTOMY N/A 04/07/2014   Procedure: LAPAROSCOPIC CHOLECYSTECTOMY WITH INTRAOPERATIVE CHOLANGIOGRAM POSSBILE OPEN;  Surgeon: Autumn Messing III, MD;  Location: [facility];  Service: General;  Laterality: N/A;  . COLON SURGERY  2008   partial  . HERNIA REPAIR    . JOINT REPLACEMENT    . left hip  relacement  2000  . TONSILLECTOMY  1935  . TOTAL ABDOMINAL HYSTERECTOMY  1970  . TRANSTHORACIC ECHOCARDIOGRAM  05/2011   EF=>55%; mild conc LVH; mild mitral annular calcif; mild TR; AV mildly sclerotic & mild AR  . VENTRAL HERNIA REPAIR  09/19/2011   Procedure: LAPAROSCOPIC VENTRAL HERNIA;  Surgeon: Merrie Roof, MD;  Location: [facility];  Service: General;  Laterality: N/A;  laparoscopic ventral hernia repair with mesh    Current Medications: Outpatient Medications Prior to Visit  Medication Sig Dispense Refill  . albuterol (PROVENTIL HFA;VENTOLIN HFA) 108 (90 BASE) MCG/ACT inhaler Inhale 2 puffs into the lungs every 6 (six) hours as needed for wheezing or shortness of breath. 1 Inhaler 2  . BIOGAIA PROBIOTIC (BIOGAIA PROBIOTIC) LIQD Take by mouth daily at 8 pm.    . Budesonide-Formoterol Fumarate (SYMBICORT IN) Inhale into the lungs 2 (two) times daily.    . calcium citrate-vitamin D (CITRACAL+D) 315-200 MG-UNIT per tablet Take 1 tablet by mouth daily.    Marland Kitchen diltiazem (CARDIZEM CD) 180 MG 24 hr capsule Take 180 mg by mouth daily.    Marland Kitchen ELIQUIS 2.5 MG TABS tablet TAKE 1 TABLET BY MOUTH TWICE DAILY 60 tablet 5  . levothyroxine (SYNTHROID, LEVOTHROID) 137 MCG tablet Take 137 mcg by mouth daily before breakfast.     . losartan (COZAAR) 100 MG tablet TAKE 1 TABLET BY MOUTH EVERY DAY KEEP OFFICE VISIT 90 tablet 3  . Multiple Vitamins-Minerals (MULTIVITAMIN WITH MINERALS) tablet Take 1 tablet by mouth daily.    . naphazoline-glycerin (CLEAR EYES) 0.012-0.2 % SOLN Place 1-2 drops into both eyes daily.     . polyethylene glycol (MIRALAX / GLYCOLAX) packet Take 17 g by mouth daily as needed for moderate constipation or severe constipation.    . promethazine-codeine (PHENERGAN WITH CODEINE) 6.25-10 MG/5ML syrup Take 5 mLs by mouth every 6 (six) hours as needed for cough. 120 mL 0  . furosemide (LASIX) 20 MG tablet Take 40 mg by mouth 2 (two) times daily.     No facility-administered medications prior to  visit.      Allergies:   Ace inhibitors and Sulfur   Social History   Socioeconomic History  . Marital status: Married    Spouse name: None  . Number of children: 2  . Years of education: None  . Highest education level: None  Social Needs  . Financial resource strain: None  . Food insecurity - worry: None  . Food insecurity - inability: None  . Transportation needs - medical: None  . Transportation needs - non-medical: None  Occupational History  . Occupation: Retired Astronomer  Tobacco Use  . Smoking status: Former Smoker    Packs/day: 1.00    Years: 20.00    Pack years: 20.00    Types: Cigarettes    Last attempt to quit: 09/11/1988  Years since quitting: 28.4  . Smokeless tobacco: Never Used  Substance and Sexual Activity  . Alcohol use: Yes    Alcohol/week: 0.0 oz    Comment: occ  . Drug use: No  . Sexual activity: Yes  Other Topics Concern  . None  Social History Narrative  . None     Family History:  The patient's family history includes Bladder Cancer in her brother; Diabetes in her brother; Heart block in her brother; Stroke in her brother, father, and mother.   ROS:   Please see the history of present illness.    ROS All other systems reviewed and are negative.   PHYSICAL EXAM:   VS:  BP 128/77   Pulse 96   Wt 107 lb 9.6 oz (48.8 kg)   BMI 19.06 kg/m    GEN: Well nourished, well developed, in no acute distress  HEENT: normal  Neck: no JVD, carotid bruits, or masses Cardiac: RRR; no murmurs, rubs, or gallops 2-3+ bilateral lower extremity edema, right lower extremity also has a open ulcer is healing. Respiratory:  clear to auscultation bilaterally, normal work of breathing GI: soft, nontender, nondistended, + BS MS: no deformity or atrophy  Skin: warm and dry, no rash Neuro:  Alert and Oriented x 3, Strength and sensation are intact Psych: euthymic mood, full affect  Wt Readings from Last 3 Encounters:  03/03/17 107 lb 9.6 oz (48.8 kg)    02/05/17 106 lb (48.1 kg)  01/07/17 105 lb 6.4 oz (47.8 kg)      Studies/Labs Reviewed:   EKG:  EKG is not ordered today.    Recent Labs: 04/17/2016: ALT 17 06/23/2016: Hemoglobin 12.7; Platelets 309 02/12/2017: BUN 22; Creatinine, Ser 0.82; Potassium 4.0; Sodium 143   Lipid Panel No results found for: CHOL, TRIG, HDL, CHOLHDL, VLDL, LDLCALC, LDLDIRECT  Additional studies/ records that were reviewed today include:   Echo 01/21/2017 LV EF: 65% - 70%  Study Conclusions  - Left ventricle: The cavity size was normal. Wall thickness was  normal. Systolic function was vigorous. The estimated ejection  fraction was in the range of 65% to 70%. Wall motion was normal;  there were no regional wall motion abnormalities. Doppler  parameters are consistent with abnormal left ventricular  relaxation (grade 1 diastolic dysfunction).  - Aortic valve: Moderately calcified annulus. Moderately thickened,  moderately calcified leaflets. There was trivial regurgitation.  - Mitral valve: Mildly to moderately calcified annulus.      Arterial ABI 01/26/2017 Final Interpretation: Right: Resting right ankle-brachial index indicates noncompressible right lower extremity arteries.The right toe-brachial index is normal.  Left: Resting left ankle-brachial index indicates noncompressible right lower extremity arteries.The left toe-brachial index is abnormal.     Venous doppler 02/09/2017 Final Interpretation Right: There is no evidence of deep vein thrombosis in the lower extremity. Left: There is no evidence of deep vein thrombosis proximal to the inguinal ligament or in the common femoral vein.    ASSESSMENT:    1. Lower extremity edema   2. Encounter for long-term (current) use of medications   3. Decubitus ulcer of right leg, unspecified pressure ulcer stage   4. PAF (paroxysmal atrial fibrillation) ([place])   5. Hypothyroidism, unspecified type   6. Venous insufficiency   7. Right leg  pain      PLAN:  In order of problems listed above:  1. Lower extremity edema: Although she has noticed increased urinary output, lower extremity edema has not changed.  Some of her symptom is  likely related to venous insufficiency.  Unfortunately I am unable to add compression stocking due to open wound.  She says her leg pain will become worse when she lift up her leg, therefore she has not been doing much leg elevation either.  I will increase her Lasix to 40 mg twice daily as a trial.  She has been instructed to contact us if she started having any signs of dehydration such as weakness or dizziness.  I will also add low-dose potassium supplement as well.  She will need a basic metabolic panel in 5-7 days.  2. Right lower extremity ulcer: Slowly healing, no longer draining as much fluid as before.  If continued to worsen may need wound clinic.  3. Leg pain: Patient described as a shooting pain.  Recent venous Doppler and ABI was normal.  I have given her gabapentin as a trial.  4. Hypothyroidism: Managed by primary care provider.  5. PAF: On Eliquis 2.5 mg twice daily. CHA2DS2-Vasc score 4 (female, age, HTN). On low-dose eliquis due to age and weight    Medication Adjustments/Labs and Tests Ordered: Current medicines are reviewed at length with the patient today.  Concerns regarding medicines are outlined above.  Medication changes, Labs and Tests ordered today are listed in the Patient Instructions below. Patient Instructions  Medication Instructions:   INCREASE Lasix to 40mg  TWICE DAILY START Potassium 4meq tablets TWICE DAILY  START Gabapentin (Neurontin) 300mg  ONCE DAILY  Labwork:   Please return to the [facility] office to have bloodwork (BMET) in ~5 days (Monday of next week). You will not need to fast for this test.   Our lab is open from 8:00am to 12pm and 1:30pm to 4:30pm. You do not need an appointment.  Testing/Procedures:  none  Follow-Up:  With Dr.  Debara Pickett in 2 weeks (December 20th at 2:15pm)  If you need a refill on your cardiac medications before your next appointment, please call your pharmacy.      Hilbert Corrigan, [place]  03/05/2017 1:22 PM    [facility] [place], [place], [place]  [address] Phone: [phone]; Fax: [phone]

## 2017-03-05 ENCOUNTER — Encounter: Payer: Self-pay | Admitting: Physician Assistant

## 2017-03-11 ENCOUNTER — Telehealth: Payer: Self-pay | Admitting: Physician Assistant

## 2017-03-11 DIAGNOSIS — I10 Essential (primary) hypertension: Secondary | ICD-10-CM | POA: Diagnosis not present

## 2017-03-11 DIAGNOSIS — I87319 Chronic venous hypertension (idiopathic) with ulcer of unspecified lower extremity: Secondary | ICD-10-CM | POA: Diagnosis not present

## 2017-03-11 DIAGNOSIS — I4891 Unspecified atrial fibrillation: Secondary | ICD-10-CM | POA: Diagnosis not present

## 2017-03-11 DIAGNOSIS — R600 Localized edema: Secondary | ICD-10-CM | POA: Insufficient documentation

## 2017-03-11 NOTE — Telephone Encounter (Signed)
Paged by answering service. Patient having worsening right leg pain since seen by [facility] in clinic 12/4. Unable to sleep. Daughter is asking to pain and sleep medications. Her pain starts at R knee and radiates to her feet. Prior hx of vertebrae. Advise to call PCP for office visit and further recommendation. Recent LE venous and arterial doppler reassuring.

## 2017-03-19 ENCOUNTER — Ambulatory Visit: Payer: Medicare HMO | Admitting: Internal Medicine

## 2017-03-19 DIAGNOSIS — R600 Localized edema: Secondary | ICD-10-CM | POA: Diagnosis not present

## 2017-03-19 DIAGNOSIS — I87319 Chronic venous hypertension (idiopathic) with ulcer of unspecified lower extremity: Secondary | ICD-10-CM | POA: Diagnosis not present

## 2017-03-19 DIAGNOSIS — Z6820 Body mass index (BMI) 20.0-20.9, adult: Secondary | ICD-10-CM | POA: Diagnosis not present

## 2017-03-27 DIAGNOSIS — G6289 Other specified polyneuropathies: Secondary | ICD-10-CM | POA: Diagnosis not present

## 2017-03-27 DIAGNOSIS — F418 Other specified anxiety disorders: Secondary | ICD-10-CM | POA: Diagnosis not present

## 2017-03-27 DIAGNOSIS — L97811 Non-pressure chronic ulcer of other part of right lower leg limited to breakdown of skin: Secondary | ICD-10-CM | POA: Diagnosis not present

## 2017-03-27 DIAGNOSIS — G629 Polyneuropathy, unspecified: Secondary | ICD-10-CM | POA: Diagnosis not present

## 2017-03-27 DIAGNOSIS — I10 Essential (primary) hypertension: Secondary | ICD-10-CM | POA: Diagnosis not present

## 2017-03-27 DIAGNOSIS — M810 Age-related osteoporosis without current pathological fracture: Secondary | ICD-10-CM | POA: Diagnosis not present

## 2017-03-27 DIAGNOSIS — I87311 Chronic venous hypertension (idiopathic) with ulcer of right lower extremity: Secondary | ICD-10-CM | POA: Diagnosis not present

## 2017-03-27 DIAGNOSIS — J439 Emphysema, unspecified: Secondary | ICD-10-CM | POA: Diagnosis not present

## 2017-03-27 DIAGNOSIS — M1990 Unspecified osteoarthritis, unspecified site: Secondary | ICD-10-CM | POA: Diagnosis not present

## 2017-03-27 DIAGNOSIS — I480 Paroxysmal atrial fibrillation: Secondary | ICD-10-CM | POA: Diagnosis not present

## 2017-03-30 DIAGNOSIS — E038 Other specified hypothyroidism: Secondary | ICD-10-CM | POA: Diagnosis not present

## 2017-03-30 DIAGNOSIS — M810 Age-related osteoporosis without current pathological fracture: Secondary | ICD-10-CM | POA: Diagnosis not present

## 2017-03-30 DIAGNOSIS — R7301 Impaired fasting glucose: Secondary | ICD-10-CM | POA: Diagnosis not present

## 2017-03-30 DIAGNOSIS — I10 Essential (primary) hypertension: Secondary | ICD-10-CM | POA: Diagnosis not present

## 2017-04-03 DIAGNOSIS — L97811 Non-pressure chronic ulcer of other part of right lower leg limited to breakdown of skin: Secondary | ICD-10-CM | POA: Diagnosis not present

## 2017-04-03 DIAGNOSIS — J439 Emphysema, unspecified: Secondary | ICD-10-CM | POA: Diagnosis not present

## 2017-04-03 DIAGNOSIS — I480 Paroxysmal atrial fibrillation: Secondary | ICD-10-CM | POA: Diagnosis not present

## 2017-04-03 DIAGNOSIS — I87311 Chronic venous hypertension (idiopathic) with ulcer of right lower extremity: Secondary | ICD-10-CM | POA: Diagnosis not present

## 2017-04-03 DIAGNOSIS — G629 Polyneuropathy, unspecified: Secondary | ICD-10-CM | POA: Diagnosis not present

## 2017-04-03 DIAGNOSIS — I10 Essential (primary) hypertension: Secondary | ICD-10-CM | POA: Diagnosis not present

## 2017-04-06 DIAGNOSIS — I10 Essential (primary) hypertension: Secondary | ICD-10-CM | POA: Diagnosis not present

## 2017-04-06 DIAGNOSIS — I87311 Chronic venous hypertension (idiopathic) with ulcer of right lower extremity: Secondary | ICD-10-CM | POA: Diagnosis not present

## 2017-04-06 DIAGNOSIS — L97811 Non-pressure chronic ulcer of other part of right lower leg limited to breakdown of skin: Secondary | ICD-10-CM | POA: Diagnosis not present

## 2017-04-06 DIAGNOSIS — I480 Paroxysmal atrial fibrillation: Secondary | ICD-10-CM | POA: Diagnosis not present

## 2017-04-06 DIAGNOSIS — G629 Polyneuropathy, unspecified: Secondary | ICD-10-CM | POA: Diagnosis not present

## 2017-04-06 DIAGNOSIS — J439 Emphysema, unspecified: Secondary | ICD-10-CM | POA: Diagnosis not present

## 2017-04-09 DIAGNOSIS — E038 Other specified hypothyroidism: Secondary | ICD-10-CM | POA: Diagnosis not present

## 2017-04-09 DIAGNOSIS — I951 Orthostatic hypotension: Secondary | ICD-10-CM | POA: Diagnosis not present

## 2017-04-09 DIAGNOSIS — R1310 Dysphagia, unspecified: Secondary | ICD-10-CM | POA: Diagnosis not present

## 2017-04-09 DIAGNOSIS — Z23 Encounter for immunization: Secondary | ICD-10-CM | POA: Diagnosis not present

## 2017-04-09 DIAGNOSIS — Z0000 Encounter for general adult medical examination without abnormal findings: Secondary | ICD-10-CM | POA: Diagnosis not present

## 2017-04-09 DIAGNOSIS — I4891 Unspecified atrial fibrillation: Secondary | ICD-10-CM | POA: Diagnosis not present

## 2017-04-09 DIAGNOSIS — M810 Age-related osteoporosis without current pathological fracture: Secondary | ICD-10-CM | POA: Diagnosis not present

## 2017-04-09 DIAGNOSIS — E871 Hypo-osmolality and hyponatremia: Secondary | ICD-10-CM | POA: Diagnosis not present

## 2017-04-09 DIAGNOSIS — I10 Essential (primary) hypertension: Secondary | ICD-10-CM | POA: Diagnosis not present

## 2017-04-10 DIAGNOSIS — I10 Essential (primary) hypertension: Secondary | ICD-10-CM | POA: Diagnosis not present

## 2017-04-10 DIAGNOSIS — L97811 Non-pressure chronic ulcer of other part of right lower leg limited to breakdown of skin: Secondary | ICD-10-CM | POA: Diagnosis not present

## 2017-04-10 DIAGNOSIS — I87311 Chronic venous hypertension (idiopathic) with ulcer of right lower extremity: Secondary | ICD-10-CM | POA: Diagnosis not present

## 2017-04-10 DIAGNOSIS — I480 Paroxysmal atrial fibrillation: Secondary | ICD-10-CM | POA: Diagnosis not present

## 2017-04-10 DIAGNOSIS — G629 Polyneuropathy, unspecified: Secondary | ICD-10-CM | POA: Diagnosis not present

## 2017-04-10 DIAGNOSIS — J439 Emphysema, unspecified: Secondary | ICD-10-CM | POA: Diagnosis not present

## 2017-04-13 ENCOUNTER — Other Ambulatory Visit: Payer: Self-pay | Admitting: Gastroenterology

## 2017-04-13 DIAGNOSIS — I10 Essential (primary) hypertension: Secondary | ICD-10-CM | POA: Diagnosis not present

## 2017-04-13 DIAGNOSIS — L97811 Non-pressure chronic ulcer of other part of right lower leg limited to breakdown of skin: Secondary | ICD-10-CM | POA: Diagnosis not present

## 2017-04-13 DIAGNOSIS — R1311 Dysphagia, oral phase: Secondary | ICD-10-CM | POA: Diagnosis not present

## 2017-04-13 DIAGNOSIS — I87311 Chronic venous hypertension (idiopathic) with ulcer of right lower extremity: Secondary | ICD-10-CM | POA: Diagnosis not present

## 2017-04-13 DIAGNOSIS — R1310 Dysphagia, unspecified: Secondary | ICD-10-CM

## 2017-04-13 DIAGNOSIS — J439 Emphysema, unspecified: Secondary | ICD-10-CM | POA: Diagnosis not present

## 2017-04-13 DIAGNOSIS — G629 Polyneuropathy, unspecified: Secondary | ICD-10-CM | POA: Diagnosis not present

## 2017-04-13 DIAGNOSIS — I480 Paroxysmal atrial fibrillation: Secondary | ICD-10-CM | POA: Diagnosis not present

## 2017-04-14 DIAGNOSIS — Z1212 Encounter for screening for malignant neoplasm of rectum: Secondary | ICD-10-CM | POA: Diagnosis not present

## 2017-04-16 ENCOUNTER — Other Ambulatory Visit: Payer: Medicare HMO

## 2017-04-16 DIAGNOSIS — I480 Paroxysmal atrial fibrillation: Secondary | ICD-10-CM | POA: Diagnosis not present

## 2017-04-16 DIAGNOSIS — G629 Polyneuropathy, unspecified: Secondary | ICD-10-CM | POA: Diagnosis not present

## 2017-04-16 DIAGNOSIS — I10 Essential (primary) hypertension: Secondary | ICD-10-CM | POA: Diagnosis not present

## 2017-04-16 DIAGNOSIS — J439 Emphysema, unspecified: Secondary | ICD-10-CM | POA: Diagnosis not present

## 2017-04-16 DIAGNOSIS — L97811 Non-pressure chronic ulcer of other part of right lower leg limited to breakdown of skin: Secondary | ICD-10-CM | POA: Diagnosis not present

## 2017-04-16 DIAGNOSIS — I87311 Chronic venous hypertension (idiopathic) with ulcer of right lower extremity: Secondary | ICD-10-CM | POA: Diagnosis not present

## 2017-04-20 DIAGNOSIS — G629 Polyneuropathy, unspecified: Secondary | ICD-10-CM | POA: Diagnosis not present

## 2017-04-20 DIAGNOSIS — L97811 Non-pressure chronic ulcer of other part of right lower leg limited to breakdown of skin: Secondary | ICD-10-CM | POA: Diagnosis not present

## 2017-04-20 DIAGNOSIS — I10 Essential (primary) hypertension: Secondary | ICD-10-CM | POA: Diagnosis not present

## 2017-04-20 DIAGNOSIS — I480 Paroxysmal atrial fibrillation: Secondary | ICD-10-CM | POA: Diagnosis not present

## 2017-04-20 DIAGNOSIS — I87311 Chronic venous hypertension (idiopathic) with ulcer of right lower extremity: Secondary | ICD-10-CM | POA: Diagnosis not present

## 2017-04-20 DIAGNOSIS — J439 Emphysema, unspecified: Secondary | ICD-10-CM | POA: Diagnosis not present

## 2017-04-22 ENCOUNTER — Other Ambulatory Visit: Payer: Medicare HMO

## 2017-04-23 DIAGNOSIS — G629 Polyneuropathy, unspecified: Secondary | ICD-10-CM | POA: Diagnosis not present

## 2017-04-23 DIAGNOSIS — I10 Essential (primary) hypertension: Secondary | ICD-10-CM | POA: Diagnosis not present

## 2017-04-23 DIAGNOSIS — I480 Paroxysmal atrial fibrillation: Secondary | ICD-10-CM | POA: Diagnosis not present

## 2017-04-23 DIAGNOSIS — I87311 Chronic venous hypertension (idiopathic) with ulcer of right lower extremity: Secondary | ICD-10-CM | POA: Diagnosis not present

## 2017-04-23 DIAGNOSIS — J439 Emphysema, unspecified: Secondary | ICD-10-CM | POA: Diagnosis not present

## 2017-04-23 DIAGNOSIS — L97811 Non-pressure chronic ulcer of other part of right lower leg limited to breakdown of skin: Secondary | ICD-10-CM | POA: Diagnosis not present

## 2017-04-24 ENCOUNTER — Ambulatory Visit
Admission: RE | Admit: 2017-04-24 | Discharge: 2017-04-24 | Disposition: A | Discharge status: Home / Self Care | Payer: Medicare HMO | Source: Ambulatory Visit | Attending: Gastroenterology | Admitting: Gastroenterology

## 2017-04-24 DIAGNOSIS — R1310 Dysphagia, unspecified: Secondary | ICD-10-CM

## 2017-04-24 DIAGNOSIS — K449 Diaphragmatic hernia without obstruction or gangrene: Secondary | ICD-10-CM | POA: Diagnosis not present

## 2017-04-28 DIAGNOSIS — I480 Paroxysmal atrial fibrillation: Secondary | ICD-10-CM | POA: Diagnosis not present

## 2017-04-28 DIAGNOSIS — G629 Polyneuropathy, unspecified: Secondary | ICD-10-CM | POA: Diagnosis not present

## 2017-04-28 DIAGNOSIS — J439 Emphysema, unspecified: Secondary | ICD-10-CM | POA: Diagnosis not present

## 2017-04-28 DIAGNOSIS — I87311 Chronic venous hypertension (idiopathic) with ulcer of right lower extremity: Secondary | ICD-10-CM | POA: Diagnosis not present

## 2017-04-28 DIAGNOSIS — I10 Essential (primary) hypertension: Secondary | ICD-10-CM | POA: Diagnosis not present

## 2017-04-28 DIAGNOSIS — L97811 Non-pressure chronic ulcer of other part of right lower leg limited to breakdown of skin: Secondary | ICD-10-CM | POA: Diagnosis not present

## 2017-04-30 DIAGNOSIS — G629 Polyneuropathy, unspecified: Secondary | ICD-10-CM | POA: Diagnosis not present

## 2017-04-30 DIAGNOSIS — L97811 Non-pressure chronic ulcer of other part of right lower leg limited to breakdown of skin: Secondary | ICD-10-CM | POA: Diagnosis not present

## 2017-04-30 DIAGNOSIS — I10 Essential (primary) hypertension: Secondary | ICD-10-CM | POA: Diagnosis not present

## 2017-04-30 DIAGNOSIS — J439 Emphysema, unspecified: Secondary | ICD-10-CM | POA: Diagnosis not present

## 2017-04-30 DIAGNOSIS — I480 Paroxysmal atrial fibrillation: Secondary | ICD-10-CM | POA: Diagnosis not present

## 2017-04-30 DIAGNOSIS — I87311 Chronic venous hypertension (idiopathic) with ulcer of right lower extremity: Secondary | ICD-10-CM | POA: Diagnosis not present

## 2017-05-04 DIAGNOSIS — I480 Paroxysmal atrial fibrillation: Secondary | ICD-10-CM | POA: Diagnosis not present

## 2017-05-04 DIAGNOSIS — I10 Essential (primary) hypertension: Secondary | ICD-10-CM | POA: Diagnosis not present

## 2017-05-04 DIAGNOSIS — G629 Polyneuropathy, unspecified: Secondary | ICD-10-CM | POA: Diagnosis not present

## 2017-05-04 DIAGNOSIS — L97811 Non-pressure chronic ulcer of other part of right lower leg limited to breakdown of skin: Secondary | ICD-10-CM | POA: Diagnosis not present

## 2017-05-04 DIAGNOSIS — I87311 Chronic venous hypertension (idiopathic) with ulcer of right lower extremity: Secondary | ICD-10-CM | POA: Diagnosis not present

## 2017-05-04 DIAGNOSIS — J439 Emphysema, unspecified: Secondary | ICD-10-CM | POA: Diagnosis not present

## 2017-05-07 DIAGNOSIS — I480 Paroxysmal atrial fibrillation: Secondary | ICD-10-CM | POA: Diagnosis not present

## 2017-05-07 DIAGNOSIS — I10 Essential (primary) hypertension: Secondary | ICD-10-CM | POA: Diagnosis not present

## 2017-05-07 DIAGNOSIS — I87311 Chronic venous hypertension (idiopathic) with ulcer of right lower extremity: Secondary | ICD-10-CM | POA: Diagnosis not present

## 2017-05-07 DIAGNOSIS — L97811 Non-pressure chronic ulcer of other part of right lower leg limited to breakdown of skin: Secondary | ICD-10-CM | POA: Diagnosis not present

## 2017-05-07 DIAGNOSIS — J439 Emphysema, unspecified: Secondary | ICD-10-CM | POA: Diagnosis not present

## 2017-05-07 DIAGNOSIS — G629 Polyneuropathy, unspecified: Secondary | ICD-10-CM | POA: Diagnosis not present

## 2017-05-11 DIAGNOSIS — L97811 Non-pressure chronic ulcer of other part of right lower leg limited to breakdown of skin: Secondary | ICD-10-CM | POA: Diagnosis not present

## 2017-05-11 DIAGNOSIS — G629 Polyneuropathy, unspecified: Secondary | ICD-10-CM | POA: Diagnosis not present

## 2017-05-11 DIAGNOSIS — J439 Emphysema, unspecified: Secondary | ICD-10-CM | POA: Diagnosis not present

## 2017-05-11 DIAGNOSIS — I480 Paroxysmal atrial fibrillation: Secondary | ICD-10-CM | POA: Diagnosis not present

## 2017-05-11 DIAGNOSIS — I87311 Chronic venous hypertension (idiopathic) with ulcer of right lower extremity: Secondary | ICD-10-CM | POA: Diagnosis not present

## 2017-05-11 DIAGNOSIS — I10 Essential (primary) hypertension: Secondary | ICD-10-CM | POA: Diagnosis not present

## 2017-05-14 ENCOUNTER — Other Ambulatory Visit: Payer: Self-pay | Admitting: Gastroenterology

## 2017-05-14 DIAGNOSIS — I87311 Chronic venous hypertension (idiopathic) with ulcer of right lower extremity: Secondary | ICD-10-CM | POA: Diagnosis not present

## 2017-05-14 DIAGNOSIS — L97811 Non-pressure chronic ulcer of other part of right lower leg limited to breakdown of skin: Secondary | ICD-10-CM | POA: Diagnosis not present

## 2017-05-14 DIAGNOSIS — G629 Polyneuropathy, unspecified: Secondary | ICD-10-CM | POA: Diagnosis not present

## 2017-05-14 DIAGNOSIS — I480 Paroxysmal atrial fibrillation: Secondary | ICD-10-CM | POA: Diagnosis not present

## 2017-05-14 DIAGNOSIS — R1311 Dysphagia, oral phase: Secondary | ICD-10-CM | POA: Diagnosis not present

## 2017-05-14 DIAGNOSIS — J439 Emphysema, unspecified: Secondary | ICD-10-CM | POA: Diagnosis not present

## 2017-05-14 DIAGNOSIS — I10 Essential (primary) hypertension: Secondary | ICD-10-CM | POA: Diagnosis not present

## 2017-05-18 ENCOUNTER — Other Ambulatory Visit: Payer: Self-pay | Admitting: Gastroenterology

## 2017-05-18 DIAGNOSIS — L97811 Non-pressure chronic ulcer of other part of right lower leg limited to breakdown of skin: Secondary | ICD-10-CM | POA: Diagnosis not present

## 2017-05-18 DIAGNOSIS — I480 Paroxysmal atrial fibrillation: Secondary | ICD-10-CM | POA: Diagnosis not present

## 2017-05-18 DIAGNOSIS — J439 Emphysema, unspecified: Secondary | ICD-10-CM | POA: Diagnosis not present

## 2017-05-18 DIAGNOSIS — I10 Essential (primary) hypertension: Secondary | ICD-10-CM | POA: Diagnosis not present

## 2017-05-18 DIAGNOSIS — I87311 Chronic venous hypertension (idiopathic) with ulcer of right lower extremity: Secondary | ICD-10-CM | POA: Diagnosis not present

## 2017-05-18 DIAGNOSIS — G629 Polyneuropathy, unspecified: Secondary | ICD-10-CM | POA: Diagnosis not present

## 2017-05-21 DIAGNOSIS — I10 Essential (primary) hypertension: Secondary | ICD-10-CM | POA: Diagnosis not present

## 2017-05-21 DIAGNOSIS — J439 Emphysema, unspecified: Secondary | ICD-10-CM | POA: Diagnosis not present

## 2017-05-21 DIAGNOSIS — G629 Polyneuropathy, unspecified: Secondary | ICD-10-CM | POA: Diagnosis not present

## 2017-05-21 DIAGNOSIS — I87311 Chronic venous hypertension (idiopathic) with ulcer of right lower extremity: Secondary | ICD-10-CM | POA: Diagnosis not present

## 2017-05-21 DIAGNOSIS — L97811 Non-pressure chronic ulcer of other part of right lower leg limited to breakdown of skin: Secondary | ICD-10-CM | POA: Diagnosis not present

## 2017-05-21 DIAGNOSIS — I480 Paroxysmal atrial fibrillation: Secondary | ICD-10-CM | POA: Diagnosis not present

## 2017-05-22 ENCOUNTER — Encounter (HOSPITAL_COMMUNITY): Payer: Self-pay | Admitting: *Deleted

## 2017-05-22 ENCOUNTER — Other Ambulatory Visit: Payer: Self-pay

## 2017-05-22 NOTE — Progress Notes (Signed)
Pt denies any acute cardiopulmonary issues. Pt under the care of Dr. Debara Pickett, Cardiology. Pt denies having a cardiac cath. Pt denies having a chest x ray within the last year. Pt denies recent labs. Pt stated that last dose of Eliquis was Thursday. Pt made aware to stop taking otc vitamins, fish oil, Probiotic and herbal medications. Do not take any NSAIDs ie: Ibuprofen, Advil, Naproxen (Aleve), Motrin, BC and Goody Powder. Pt verbalized understanding of all pre-op instructions.

## 2017-05-24 NOTE — Anesthesia Preprocedure Evaluation (Addendum)
Anesthesia Evaluation  Patient identified by MRN, date of birth, ID band Patient awake    Reviewed: Allergy & Precautions, H&P , NPO status , Patient's Chart, lab work & pertinent test results  History of Anesthesia Complications (+) PONV  Airway Mallampati: I  TM Distance: >3 FB Neck ROM: Full    Dental no notable dental hx. (+) Teeth Intact, Dental Advisory Given   Pulmonary COPD,  COPD inhaler, former smoker,    Pulmonary exam normal breath sounds clear to auscultation       Cardiovascular Exercise Tolerance: Good hypertension, Pt. on medications + dysrhythmias Atrial Fibrillation  Rhythm:Regular Rate:Normal     Neuro/Psych Anxiety negative neurological ROS     GI/Hepatic Neg liver ROS, GERD  Controlled,  Endo/Other  Hypothyroidism   Renal/GU negative Renal ROS  negative genitourinary   Musculoskeletal  (+) Arthritis , Osteoarthritis,    Abdominal   Peds  Hematology negative hematology ROS (+)   Anesthesia Other Findings   Reproductive/Obstetrics negative OB ROS                            Anesthesia Physical Anesthesia Plan  ASA: III  Anesthesia Plan: MAC   Post-op Pain Management:    Induction: Intravenous  PONV Risk Score and Plan: 3 and Propofol infusion and Treatment may vary due to age or medical condition  Airway Management Planned: Nasal Cannula  Additional Equipment:   Intra-op Plan:   Post-operative Plan:   Informed Consent: I have reviewed the patients History and Physical, chart, labs and discussed the procedure including the risks, benefits and alternatives for the proposed anesthesia with the patient or authorized representative who has indicated his/her understanding and acceptance.   Dental advisory given  Plan Discussed with: CRNA  Anesthesia Plan Comments:         Anesthesia Quick Evaluation

## 2017-05-25 ENCOUNTER — Encounter (HOSPITAL_COMMUNITY): Payer: Self-pay | Admitting: *Deleted

## 2017-05-25 ENCOUNTER — Ambulatory Visit (HOSPITAL_COMMUNITY)
Admission: RE | Admit: 2017-05-25 | Discharge: 2017-05-25 | Disposition: A | Discharge status: Home / Self Care | Payer: Medicare HMO | Source: Ambulatory Visit | Attending: Gastroenterology | Admitting: Gastroenterology

## 2017-05-25 ENCOUNTER — Ambulatory Visit (HOSPITAL_COMMUNITY): Payer: Medicare HMO | Admitting: Anesthesiology

## 2017-05-25 ENCOUNTER — Ambulatory Visit (HOSPITAL_COMMUNITY): Payer: Medicare HMO

## 2017-05-25 ENCOUNTER — Encounter (HOSPITAL_COMMUNITY)
Admission: RE | Disposition: A | Discharge status: Home / Self Care | Payer: Self-pay | Source: Ambulatory Visit | Attending: Gastroenterology

## 2017-05-25 DIAGNOSIS — K224 Dyskinesia of esophagus: Secondary | ICD-10-CM | POA: Insufficient documentation

## 2017-05-25 DIAGNOSIS — R1310 Dysphagia, unspecified: Secondary | ICD-10-CM | POA: Diagnosis not present

## 2017-05-25 DIAGNOSIS — I10 Essential (primary) hypertension: Secondary | ICD-10-CM | POA: Diagnosis not present

## 2017-05-25 DIAGNOSIS — K439 Ventral hernia without obstruction or gangrene: Secondary | ICD-10-CM | POA: Diagnosis not present

## 2017-05-25 DIAGNOSIS — Z90710 Acquired absence of both cervix and uterus: Secondary | ICD-10-CM | POA: Diagnosis not present

## 2017-05-25 DIAGNOSIS — K219 Gastro-esophageal reflux disease without esophagitis: Secondary | ICD-10-CM | POA: Diagnosis not present

## 2017-05-25 DIAGNOSIS — E785 Hyperlipidemia, unspecified: Secondary | ICD-10-CM | POA: Insufficient documentation

## 2017-05-25 DIAGNOSIS — Z96642 Presence of left artificial hip joint: Secondary | ICD-10-CM | POA: Insufficient documentation

## 2017-05-25 DIAGNOSIS — M1990 Unspecified osteoarthritis, unspecified site: Secondary | ICD-10-CM | POA: Diagnosis not present

## 2017-05-25 DIAGNOSIS — Z87891 Personal history of nicotine dependence: Secondary | ICD-10-CM | POA: Diagnosis not present

## 2017-05-25 DIAGNOSIS — Z823 Family history of stroke: Secondary | ICD-10-CM | POA: Insufficient documentation

## 2017-05-25 DIAGNOSIS — Z8249 Family history of ischemic heart disease and other diseases of the circulatory system: Secondary | ICD-10-CM | POA: Insufficient documentation

## 2017-05-25 DIAGNOSIS — K449 Diaphragmatic hernia without obstruction or gangrene: Secondary | ICD-10-CM | POA: Diagnosis not present

## 2017-05-25 DIAGNOSIS — E039 Hypothyroidism, unspecified: Secondary | ICD-10-CM | POA: Insufficient documentation

## 2017-05-25 DIAGNOSIS — J449 Chronic obstructive pulmonary disease, unspecified: Secondary | ICD-10-CM | POA: Insufficient documentation

## 2017-05-25 DIAGNOSIS — I4891 Unspecified atrial fibrillation: Secondary | ICD-10-CM | POA: Diagnosis not present

## 2017-05-25 DIAGNOSIS — Z79899 Other long term (current) drug therapy: Secondary | ICD-10-CM | POA: Insufficient documentation

## 2017-05-25 HISTORY — PX: ESOPHAGOGASTRODUODENOSCOPY (EGD) WITH PROPOFOL: SHX5813

## 2017-05-25 HISTORY — PX: SAVORY DILATION: SHX5439

## 2017-05-25 SURGERY — ESOPHAGOGASTRODUODENOSCOPY (EGD) WITH PROPOFOL
Anesthesia: Monitor Anesthesia Care

## 2017-05-25 MED ORDER — PROPOFOL 10 MG/ML IV BOLUS
INTRAVENOUS | Status: DC | PRN
  Administered 2017-05-25: 20 mg via INTRAVENOUS
  Administered 2017-05-25: 10 mg via INTRAVENOUS
  Administered 2017-05-25: 20 mg via INTRAVENOUS

## 2017-05-25 MED ORDER — LIDOCAINE 2% (20 MG/ML) 5 ML SYRINGE
INTRAMUSCULAR | Status: DC | PRN
  Administered 2017-05-25: 40 mg via INTRAVENOUS

## 2017-05-25 MED ORDER — DEXMEDETOMIDINE HCL 200 MCG/2ML IV SOLN
INTRAVENOUS | Status: DC | PRN
  Administered 2017-05-25: 12 ug via INTRAVENOUS

## 2017-05-25 MED ORDER — PHENYLEPHRINE 40 MCG/ML (10ML) SYRINGE FOR IV PUSH (FOR BLOOD PRESSURE SUPPORT)
PREFILLED_SYRINGE | INTRAVENOUS | Status: DC | PRN
  Administered 2017-05-25 (×2): 80 ug via INTRAVENOUS

## 2017-05-25 MED ORDER — PROPOFOL 500 MG/50ML IV EMUL
INTRAVENOUS | Status: DC | PRN
  Administered 2017-05-25: 100 ug/kg/min via INTRAVENOUS

## 2017-05-25 MED ORDER — LACTATED RINGERS IV SOLN
INTRAVENOUS | Status: DC | PRN
  Administered 2017-05-25: 09:00:00 via INTRAVENOUS

## 2017-05-25 SURGICAL SUPPLY — 15 items

## 2017-05-25 NOTE — Transfer of Care (Signed)
Immediate Anesthesia Transfer of Care Note  Patient: Heather [name]  Procedure(s) Performed: ESOPHAGOGASTRODUODENOSCOPY (EGD) WITH PROPOFOL (N/A ) BALLOON DILATION (N/A )  Patient Location: Endoscopy Unit  Anesthesia Type:MAC  Level of Consciousness: awake, alert  and oriented  Airway & Oxygen Therapy: Patient Spontanous Breathing  Post-op Assessment: Report given to RN and Post -op Vital signs reviewed and stable  Post vital signs: Reviewed and stable  Last Vitals:  Vitals:   05/25/17 0936 05/25/17 0940  BP: (!) 74/33 (!) 89/65  Pulse: (!) 59 61  Resp: 20 (!) 23  Temp: (!) 35.7 C   SpO2: 100% 99%    Last Pain:  Vitals:   05/25/17 0936  TempSrc: Axillary         Complications: No apparent anesthesia complications

## 2017-05-25 NOTE — Anesthesia Procedure Notes (Signed)
Procedure Name: MAC Performed by: Valda Favia, CRNA Pre-anesthesia Checklist: Patient identified, Emergency Drugs available, Suction available, Patient being monitored and Timeout performed Patient Re-evaluated:Patient Re-evaluated prior to induction Oxygen Delivery Method: Nasal cannula Airway Equipment and Method: Bite block Placement Confirmation: positive ETCO2 Dental Injury: Teeth and Oropharynx as per pre-operative assessment

## 2017-05-25 NOTE — Discharge Instructions (Signed)
YOU HAD AN ENDOSCOPIC PROCEDURE TODAY: Refer to the procedure report and other information in the discharge instructions given to you for any specific questions about what was found during the examination. If this information does not answer your questions, please call [facility] office at [phone] to clarify.   YOU SHOULD EXPECT: Some feelings of bloating in the abdomen. Passage of more gas than usual. Walking can help get rid of the air that was put into your GI tract during the procedure and reduce the bloating.   DIET: Your first meal following the procedure should be a light meal and then it is ok to progress to your normal diet. A half-sandwich or bowl of soup is an example of a good first meal. Heavy or fried foods are harder to digest and may make you feel nauseous or bloated. Drink plenty of fluids but you should avoid alcoholic beverages for 24 hours. If you had a esophageal dilation, please see attached instructions for diet.   ACTIVITY: Your care partner should take you home directly after the procedure. You should plan to take it easy, moving slowly for the rest of the day. You can resume normal activity the day after the procedure however YOU SHOULD NOT DRIVE, use power tools, machinery or perform tasks that involve climbing or major physical exertion for 24 hours (because of the sedation medicines used during the test).   SYMPTOMS TO REPORT IMMEDIATELY: A gastroenterologist can be reached at any hour. Please call [phone]  for any of the following symptoms:   Following upper endoscopy (EGD, EUS, ERCP, esophageal dilation) Vomiting of blood or coffee ground material  New, significant abdominal pain  New, significant chest pain or pain under the shoulder blades  Painful or persistently difficult swallowing  New shortness of breath  Black, tarry-looking or red, bloody stools  FOLLOW UP:  If any biopsies were taken you will be contacted by phone or by letter within the next 1-3  weeks. Call [phone]  if you have not heard about the biopsies in 3 weeks.  Please also call with any specific questions about appointments or follow up tests. Call if question or problem otherwise follow-up in 4-6 weeks and begin with soft solids and slowly diet as tolerated and okay to restart eliques if doing okay

## 2017-05-25 NOTE — Op Note (Signed)
[facility] Patient Name: Heather [name] Procedure Date : 05/25/2017 MRN: [id number] Attending MD: Clarene Essex , MD Date of Birth: [birth year] CSN: [id number] Age: 84 Admit Type: Outpatient Procedure:                Upper GI endoscopy Indications:              Dysphagia Providers:                Clarene Essex, MD, Burtis Junes, RN, Cherylynn Ridges,                            Technician, Edmonia James, CRNA Referring MD:              Medicines:                Propofol total dose 120 mg IV and another medicine                            per anesthesia Complications:            No immediate complications. Estimated Blood Loss:     Estimated blood loss: none. Procedure:                Pre-Anesthesia Assessment:                           - Prior to the procedure, a History and Physical                            was performed, and patient medications and                            allergies were reviewed. The patient's tolerance of                            previous anesthesia was also reviewed. The risks                            and benefits of the procedure and the sedation                            options and risks were discussed with the patient.                            All questions were answered, and informed consent                            was obtained. Prior Anticoagulants: The patient has                            taken Eliquis (apixaban), last dose was 4 days                            prior to procedure. ASA Grade Assessment: II - A  patient with mild systemic disease. After reviewing                            the risks and benefits, the patient was deemed in                            satisfactory condition to undergo the procedure.                           After obtaining informed consent, the endoscope was                            passed under direct vision. Throughout the                            procedure, the patient's  blood pressure, pulse, and                            oxygen saturations were monitored continuously. The                            ([id number]) [id number] was introduced through the                            mouth, and advanced to the fourth part of duodenum.                            The upper GI endoscopy was accomplished without                            difficulty. The patient tolerated the procedure                            well. Scope In: Scope Out: Findings:      The larynx was normal.      A small hiatal hernia was present.      Abnormal motility was noted at the gastroesophageal junction. The       cricopharyngeus was abnormal it was spastic as well and we had some       difficulty initially passing the scope. The distal esophagus/lower       esophageal sphincter is spastic, but gives up passage to the endoscope.       A guidewire was placed under fluoroscopic guidance after the endoscopy       was completed and the scope was withdrawn. Dilation was performed with a       Savary dilator with no resistance and no heme at 14 mm and 15 mm.      The entire examined stomach was normal.      The duodenal bulb, first portion of the duodenum, second portion of the       duodenum, third portion of the duodenum and fourth portion of the       duodenum were normal.      The exam was otherwise without abnormality. Impression:               - Normal larynx.                           -  Small hiatal hernia.                           - Abnormal esophageal motility at both upper and                            lower esophageal sphincters, consistent with                            presbyesophagus. Dilated.                           - Normal stomach.                           - Normal duodenal bulb, first portion of the                            duodenum, second portion of the duodenum, third                            portion of the duodenum and fourth portion of the                             duodenum.                           - The examination was otherwise normal.                           - No specimens collected. Moderate Sedation:      moderate sedation-none Recommendation:           - Patient has a contact number available for                            emergencies. The signs and symptoms of potential                            delayed complications were discussed with the                            patient. Return to normal activities tomorrow.                            Written discharge instructions were provided to the                            patient.                           - Soft diet today.                           - Resume Eliquis (apixaban) at prior dose today.                           -  Continue present medications.                           - Return to GI clinic in 4-6 weeks.                           - Telephone GI clinic if symptomatic PRN. Procedure Code(s):        --- Professional ---                           [phone], Esophagogastroduodenoscopy, flexible,                            transoral; with insertion of guide wire followed by                            passage of dilator(s) through esophagus over guide                            wire Diagnosis Code(s):        --- Professional ---                           K44.9, Diaphragmatic hernia without obstruction or                            gangrene                           K22.4, Dyskinesia of esophagus                           R13.10, Dysphagia, unspecified CPT copyright 2016 American Medical Association. All rights reserved. The codes documented in this report are preliminary and upon coder review may  be revised to meet current compliance requirements. Clarene Essex, MD 05/25/2017 9:36:49 AM This report has been signed electronically. Number of Addenda: 0

## 2017-05-25 NOTE — Progress Notes (Signed)
Heather [name] 8:58 AM  Subjective: Patient without any new complaints since we recently saw her in the office  Objective: Vital signs stable afebrile no acute distress exam please see preassessment evaluation  Assessment: Dysphasia  Plan: Okay to proceed with endoscopy with anesthesia assistance and possible dilation  [facility] E  Pager [phone] After 5PM or if no answer call [phone]

## 2017-05-25 NOTE — Anesthesia Postprocedure Evaluation (Signed)
Anesthesia Post Note  Patient: Heather [name]  Procedure(s) Performed: ESOPHAGOGASTRODUODENOSCOPY (EGD) WITH PROPOFOL (N/A ) BALLOON DILATION (N/A )     Patient location during evaluation: PACU Anesthesia Type: MAC Level of consciousness: awake and alert Pain management: pain level controlled Vital Signs Assessment: post-procedure vital signs reviewed and stable Respiratory status: spontaneous breathing, nonlabored ventilation and respiratory function stable Cardiovascular status: stable and blood pressure returned to baseline Postop Assessment: no apparent nausea or vomiting Anesthetic complications: no    Last Vitals:  Vitals:   05/25/17 0955 05/25/17 1005  BP: (!) 96/43 (!) 111/53  Pulse: 60 63  Resp: 19 19  Temp:    SpO2: 99% 98%    Last Pain:  Vitals:   05/25/17 0936  TempSrc: Axillary                 Jaydee Ingman,W. EDMOND

## 2017-05-26 DIAGNOSIS — F418 Other specified anxiety disorders: Secondary | ICD-10-CM | POA: Diagnosis not present

## 2017-05-26 DIAGNOSIS — I480 Paroxysmal atrial fibrillation: Secondary | ICD-10-CM | POA: Diagnosis not present

## 2017-05-26 DIAGNOSIS — G6289 Other specified polyneuropathies: Secondary | ICD-10-CM | POA: Diagnosis not present

## 2017-05-26 DIAGNOSIS — L97811 Non-pressure chronic ulcer of other part of right lower leg limited to breakdown of skin: Secondary | ICD-10-CM | POA: Diagnosis not present

## 2017-05-26 DIAGNOSIS — G629 Polyneuropathy, unspecified: Secondary | ICD-10-CM | POA: Diagnosis not present

## 2017-05-26 DIAGNOSIS — J439 Emphysema, unspecified: Secondary | ICD-10-CM | POA: Diagnosis not present

## 2017-05-26 DIAGNOSIS — I10 Essential (primary) hypertension: Secondary | ICD-10-CM | POA: Diagnosis not present

## 2017-05-26 DIAGNOSIS — M1991 Primary osteoarthritis, unspecified site: Secondary | ICD-10-CM | POA: Diagnosis not present

## 2017-05-26 DIAGNOSIS — M810 Age-related osteoporosis without current pathological fracture: Secondary | ICD-10-CM | POA: Diagnosis not present

## 2017-05-26 DIAGNOSIS — I87311 Chronic venous hypertension (idiopathic) with ulcer of right lower extremity: Secondary | ICD-10-CM | POA: Diagnosis not present

## 2017-05-28 ENCOUNTER — Encounter (HOSPITAL_COMMUNITY): Payer: Self-pay | Admitting: Gastroenterology

## 2017-05-28 ENCOUNTER — Other Ambulatory Visit: Payer: Self-pay | Admitting: Internal Medicine

## 2017-05-28 DIAGNOSIS — L819 Disorder of pigmentation, unspecified: Secondary | ICD-10-CM | POA: Diagnosis not present

## 2017-05-28 DIAGNOSIS — J449 Chronic obstructive pulmonary disease, unspecified: Secondary | ICD-10-CM | POA: Diagnosis not present

## 2017-05-28 DIAGNOSIS — Z681 Body mass index (BMI) 19 or less, adult: Secondary | ICD-10-CM | POA: Diagnosis not present

## 2017-05-28 DIAGNOSIS — I87319 Chronic venous hypertension (idiopathic) with ulcer of unspecified lower extremity: Secondary | ICD-10-CM | POA: Diagnosis not present

## 2017-05-29 DIAGNOSIS — J439 Emphysema, unspecified: Secondary | ICD-10-CM | POA: Diagnosis not present

## 2017-05-29 DIAGNOSIS — G629 Polyneuropathy, unspecified: Secondary | ICD-10-CM | POA: Diagnosis not present

## 2017-05-29 DIAGNOSIS — I10 Essential (primary) hypertension: Secondary | ICD-10-CM | POA: Diagnosis not present

## 2017-05-29 DIAGNOSIS — L97811 Non-pressure chronic ulcer of other part of right lower leg limited to breakdown of skin: Secondary | ICD-10-CM | POA: Diagnosis not present

## 2017-05-29 DIAGNOSIS — I87311 Chronic venous hypertension (idiopathic) with ulcer of right lower extremity: Secondary | ICD-10-CM | POA: Diagnosis not present

## 2017-05-29 DIAGNOSIS — I480 Paroxysmal atrial fibrillation: Secondary | ICD-10-CM | POA: Diagnosis not present

## 2017-06-01 DIAGNOSIS — J439 Emphysema, unspecified: Secondary | ICD-10-CM | POA: Diagnosis not present

## 2017-06-01 DIAGNOSIS — G629 Polyneuropathy, unspecified: Secondary | ICD-10-CM | POA: Diagnosis not present

## 2017-06-01 DIAGNOSIS — I87311 Chronic venous hypertension (idiopathic) with ulcer of right lower extremity: Secondary | ICD-10-CM | POA: Diagnosis not present

## 2017-06-01 DIAGNOSIS — L97811 Non-pressure chronic ulcer of other part of right lower leg limited to breakdown of skin: Secondary | ICD-10-CM | POA: Diagnosis not present

## 2017-06-01 DIAGNOSIS — I10 Essential (primary) hypertension: Secondary | ICD-10-CM | POA: Diagnosis not present

## 2017-06-01 DIAGNOSIS — I480 Paroxysmal atrial fibrillation: Secondary | ICD-10-CM | POA: Diagnosis not present

## 2017-06-04 DIAGNOSIS — I480 Paroxysmal atrial fibrillation: Secondary | ICD-10-CM | POA: Diagnosis not present

## 2017-06-04 DIAGNOSIS — L97811 Non-pressure chronic ulcer of other part of right lower leg limited to breakdown of skin: Secondary | ICD-10-CM | POA: Diagnosis not present

## 2017-06-04 DIAGNOSIS — J439 Emphysema, unspecified: Secondary | ICD-10-CM | POA: Diagnosis not present

## 2017-06-04 DIAGNOSIS — G629 Polyneuropathy, unspecified: Secondary | ICD-10-CM | POA: Diagnosis not present

## 2017-06-04 DIAGNOSIS — I87311 Chronic venous hypertension (idiopathic) with ulcer of right lower extremity: Secondary | ICD-10-CM | POA: Diagnosis not present

## 2017-06-04 DIAGNOSIS — I10 Essential (primary) hypertension: Secondary | ICD-10-CM | POA: Diagnosis not present

## 2017-06-08 ENCOUNTER — Other Ambulatory Visit: Payer: Self-pay | Admitting: Internal Medicine

## 2017-06-08 DIAGNOSIS — G629 Polyneuropathy, unspecified: Secondary | ICD-10-CM | POA: Diagnosis not present

## 2017-06-08 DIAGNOSIS — I480 Paroxysmal atrial fibrillation: Secondary | ICD-10-CM | POA: Diagnosis not present

## 2017-06-08 DIAGNOSIS — J439 Emphysema, unspecified: Secondary | ICD-10-CM | POA: Diagnosis not present

## 2017-06-08 DIAGNOSIS — I87311 Chronic venous hypertension (idiopathic) with ulcer of right lower extremity: Secondary | ICD-10-CM | POA: Diagnosis not present

## 2017-06-08 DIAGNOSIS — L97811 Non-pressure chronic ulcer of other part of right lower leg limited to breakdown of skin: Secondary | ICD-10-CM | POA: Diagnosis not present

## 2017-06-08 DIAGNOSIS — I10 Essential (primary) hypertension: Secondary | ICD-10-CM | POA: Diagnosis not present

## 2017-06-09 DIAGNOSIS — M810 Age-related osteoporosis without current pathological fracture: Secondary | ICD-10-CM | POA: Diagnosis not present

## 2017-06-09 DIAGNOSIS — Z79899 Other long term (current) drug therapy: Secondary | ICD-10-CM | POA: Diagnosis not present

## 2017-06-12 DIAGNOSIS — G629 Polyneuropathy, unspecified: Secondary | ICD-10-CM | POA: Diagnosis not present

## 2017-06-12 DIAGNOSIS — J439 Emphysema, unspecified: Secondary | ICD-10-CM | POA: Diagnosis not present

## 2017-06-12 DIAGNOSIS — I480 Paroxysmal atrial fibrillation: Secondary | ICD-10-CM | POA: Diagnosis not present

## 2017-06-12 DIAGNOSIS — L97811 Non-pressure chronic ulcer of other part of right lower leg limited to breakdown of skin: Secondary | ICD-10-CM | POA: Diagnosis not present

## 2017-06-12 DIAGNOSIS — I10 Essential (primary) hypertension: Secondary | ICD-10-CM | POA: Diagnosis not present

## 2017-06-12 DIAGNOSIS — I87311 Chronic venous hypertension (idiopathic) with ulcer of right lower extremity: Secondary | ICD-10-CM | POA: Diagnosis not present

## 2017-06-15 DIAGNOSIS — I10 Essential (primary) hypertension: Secondary | ICD-10-CM | POA: Diagnosis not present

## 2017-06-15 DIAGNOSIS — L97811 Non-pressure chronic ulcer of other part of right lower leg limited to breakdown of skin: Secondary | ICD-10-CM | POA: Diagnosis not present

## 2017-06-15 DIAGNOSIS — I87311 Chronic venous hypertension (idiopathic) with ulcer of right lower extremity: Secondary | ICD-10-CM | POA: Diagnosis not present

## 2017-06-15 DIAGNOSIS — J439 Emphysema, unspecified: Secondary | ICD-10-CM | POA: Diagnosis not present

## 2017-06-15 DIAGNOSIS — I480 Paroxysmal atrial fibrillation: Secondary | ICD-10-CM | POA: Diagnosis not present

## 2017-06-15 DIAGNOSIS — G629 Polyneuropathy, unspecified: Secondary | ICD-10-CM | POA: Diagnosis not present

## 2017-06-18 DIAGNOSIS — G629 Polyneuropathy, unspecified: Secondary | ICD-10-CM | POA: Diagnosis not present

## 2017-06-18 DIAGNOSIS — I480 Paroxysmal atrial fibrillation: Secondary | ICD-10-CM | POA: Diagnosis not present

## 2017-06-18 DIAGNOSIS — L97811 Non-pressure chronic ulcer of other part of right lower leg limited to breakdown of skin: Secondary | ICD-10-CM | POA: Diagnosis not present

## 2017-06-18 DIAGNOSIS — J439 Emphysema, unspecified: Secondary | ICD-10-CM | POA: Diagnosis not present

## 2017-06-18 DIAGNOSIS — I87311 Chronic venous hypertension (idiopathic) with ulcer of right lower extremity: Secondary | ICD-10-CM | POA: Diagnosis not present

## 2017-06-18 DIAGNOSIS — I10 Essential (primary) hypertension: Secondary | ICD-10-CM | POA: Diagnosis not present

## 2017-07-13 ENCOUNTER — Telehealth: Payer: Self-pay | Admitting: Internal Medicine

## 2017-07-13 DIAGNOSIS — Z79899 Other long term (current) drug therapy: Secondary | ICD-10-CM

## 2017-07-13 NOTE — Telephone Encounter (Signed)
New message    Pt c/o medication issue:  1. Name of Medication: HCTZ  2. How are you currently taking this medication (dosage and times per day)? N/A  3. Are you having a reaction (difficulty breathing--STAT)? NO  4. What is your medication issue? Patient wants to know if she should be taking.

## 2017-07-13 NOTE — Telephone Encounter (Signed)
Returned call to patient who states she was wondering if she needs to continue lasix vs HCTZ.     Advised per last OV note with Almyra Deforest PA, he HCTZ was discontinued and she was taking furosemide.   She states she thought the lasix was too much, so 2 weeks ago she stopped this and restarted HCTZ. Patient states her swelling and leg wound has resolved and she is not having any issues, therefore she was wondering if she should switch back to HCTZ as she was taking before the changes in October.   Advised I would route to PA for review.

## 2017-07-15 MED ORDER — FUROSEMIDE 40 MG PO TABS: 40.0000 mg | ORAL_TABLET | ORAL | 3 refills | Status: DC | PRN

## 2017-07-15 MED ORDER — HYDROCHLOROTHIAZIDE 25 MG PO TABS: 25.0000 mg | ORAL_TABLET | Freq: Every day | ORAL | 3 refills | Status: DC

## 2017-07-15 NOTE — Telephone Encounter (Signed)
Patient aware and verbalized understanding.  Patient will have lab drawn at PCP-lab order faxed.   rx sent to pharmacy

## 2017-07-15 NOTE — Telephone Encounter (Signed)
If she feels lasix is too strong for her and wish to switch back to her previous HCTZ and use lasix on the side as needed, that is fine with me. However she will need to check her BMET in 2 weeks either at PCP's office or here to make sure she is not taking too much potassium because HCTZ is weaker diuretic than lasix

## 2017-07-20 DIAGNOSIS — Z681 Body mass index (BMI) 19 or less, adult: Secondary | ICD-10-CM | POA: Diagnosis not present

## 2017-07-20 DIAGNOSIS — I10 Essential (primary) hypertension: Secondary | ICD-10-CM | POA: Diagnosis not present

## 2017-07-20 DIAGNOSIS — I4891 Unspecified atrial fibrillation: Secondary | ICD-10-CM | POA: Diagnosis not present

## 2017-07-20 DIAGNOSIS — R0602 Shortness of breath: Secondary | ICD-10-CM | POA: Diagnosis not present

## 2017-07-20 DIAGNOSIS — J449 Chronic obstructive pulmonary disease, unspecified: Secondary | ICD-10-CM | POA: Diagnosis not present

## 2017-07-20 DIAGNOSIS — I87319 Chronic venous hypertension (idiopathic) with ulcer of unspecified lower extremity: Secondary | ICD-10-CM | POA: Diagnosis not present

## 2017-07-27 DIAGNOSIS — M810 Age-related osteoporosis without current pathological fracture: Secondary | ICD-10-CM | POA: Diagnosis not present

## 2017-07-27 DIAGNOSIS — G6289 Other specified polyneuropathies: Secondary | ICD-10-CM | POA: Diagnosis not present

## 2017-07-27 DIAGNOSIS — Z7951 Long term (current) use of inhaled steroids: Secondary | ICD-10-CM | POA: Diagnosis not present

## 2017-07-27 DIAGNOSIS — J449 Chronic obstructive pulmonary disease, unspecified: Secondary | ICD-10-CM | POA: Diagnosis not present

## 2017-07-27 DIAGNOSIS — I10 Essential (primary) hypertension: Secondary | ICD-10-CM | POA: Diagnosis not present

## 2017-07-27 DIAGNOSIS — G629 Polyneuropathy, unspecified: Secondary | ICD-10-CM | POA: Diagnosis not present

## 2017-07-27 DIAGNOSIS — I4891 Unspecified atrial fibrillation: Secondary | ICD-10-CM | POA: Diagnosis not present

## 2017-07-27 DIAGNOSIS — E038 Other specified hypothyroidism: Secondary | ICD-10-CM | POA: Diagnosis not present

## 2017-07-27 DIAGNOSIS — I87303 Chronic venous hypertension (idiopathic) without complications of bilateral lower extremity: Secondary | ICD-10-CM | POA: Diagnosis not present

## 2017-07-27 DIAGNOSIS — Z7901 Long term (current) use of anticoagulants: Secondary | ICD-10-CM | POA: Diagnosis not present

## 2017-07-28 DIAGNOSIS — N390 Urinary tract infection, site not specified: Secondary | ICD-10-CM | POA: Diagnosis not present

## 2017-07-28 DIAGNOSIS — Z681 Body mass index (BMI) 19 or less, adult: Secondary | ICD-10-CM | POA: Diagnosis not present

## 2017-07-28 DIAGNOSIS — R1084 Generalized abdominal pain: Secondary | ICD-10-CM | POA: Diagnosis not present

## 2017-07-28 DIAGNOSIS — M810 Age-related osteoporosis without current pathological fracture: Secondary | ICD-10-CM | POA: Diagnosis not present

## 2017-07-28 DIAGNOSIS — R0602 Shortness of breath: Secondary | ICD-10-CM | POA: Diagnosis not present

## 2017-07-30 ENCOUNTER — Other Ambulatory Visit: Payer: Self-pay | Admitting: Internal Medicine

## 2017-07-30 DIAGNOSIS — I4891 Unspecified atrial fibrillation: Secondary | ICD-10-CM | POA: Diagnosis not present

## 2017-07-30 DIAGNOSIS — I10 Essential (primary) hypertension: Secondary | ICD-10-CM | POA: Diagnosis not present

## 2017-07-30 DIAGNOSIS — M810 Age-related osteoporosis without current pathological fracture: Secondary | ICD-10-CM | POA: Diagnosis not present

## 2017-07-30 DIAGNOSIS — I87303 Chronic venous hypertension (idiopathic) without complications of bilateral lower extremity: Secondary | ICD-10-CM | POA: Diagnosis not present

## 2017-07-30 DIAGNOSIS — G629 Polyneuropathy, unspecified: Secondary | ICD-10-CM | POA: Diagnosis not present

## 2017-07-30 DIAGNOSIS — J449 Chronic obstructive pulmonary disease, unspecified: Secondary | ICD-10-CM | POA: Diagnosis not present

## 2017-07-30 DIAGNOSIS — R109 Unspecified abdominal pain: Secondary | ICD-10-CM

## 2017-08-03 DIAGNOSIS — I4891 Unspecified atrial fibrillation: Secondary | ICD-10-CM | POA: Diagnosis not present

## 2017-08-03 DIAGNOSIS — I10 Essential (primary) hypertension: Secondary | ICD-10-CM | POA: Diagnosis not present

## 2017-08-03 DIAGNOSIS — M810 Age-related osteoporosis without current pathological fracture: Secondary | ICD-10-CM | POA: Diagnosis not present

## 2017-08-03 DIAGNOSIS — I87303 Chronic venous hypertension (idiopathic) without complications of bilateral lower extremity: Secondary | ICD-10-CM | POA: Diagnosis not present

## 2017-08-03 DIAGNOSIS — G629 Polyneuropathy, unspecified: Secondary | ICD-10-CM | POA: Diagnosis not present

## 2017-08-03 DIAGNOSIS — J449 Chronic obstructive pulmonary disease, unspecified: Secondary | ICD-10-CM | POA: Diagnosis not present

## 2017-08-04 ENCOUNTER — Ambulatory Visit
Admission: RE | Admit: 2017-08-04 | Discharge: 2017-08-04 | Disposition: A | Discharge status: Home / Self Care | Payer: Medicare HMO | Source: Ambulatory Visit | Attending: Internal Medicine | Admitting: Internal Medicine

## 2017-08-04 DIAGNOSIS — R109 Unspecified abdominal pain: Secondary | ICD-10-CM

## 2017-08-04 DIAGNOSIS — R1084 Generalized abdominal pain: Secondary | ICD-10-CM | POA: Diagnosis not present

## 2017-08-04 MED ORDER — IOPAMIDOL (ISOVUE-300) INJECTION 61%
100.0000 mL | Freq: Once | INTRAVENOUS | Status: AC | PRN
  Administered 2017-08-04: 100 mL via INTRAVENOUS

## 2017-08-06 DIAGNOSIS — M810 Age-related osteoporosis without current pathological fracture: Secondary | ICD-10-CM | POA: Diagnosis not present

## 2017-08-06 DIAGNOSIS — L218 Other seborrheic dermatitis: Secondary | ICD-10-CM | POA: Diagnosis not present

## 2017-08-06 DIAGNOSIS — D485 Neoplasm of uncertain behavior of skin: Secondary | ICD-10-CM | POA: Diagnosis not present

## 2017-08-06 DIAGNOSIS — I4891 Unspecified atrial fibrillation: Secondary | ICD-10-CM | POA: Diagnosis not present

## 2017-08-06 DIAGNOSIS — I87303 Chronic venous hypertension (idiopathic) without complications of bilateral lower extremity: Secondary | ICD-10-CM | POA: Diagnosis not present

## 2017-08-06 DIAGNOSIS — L820 Inflamed seborrheic keratosis: Secondary | ICD-10-CM | POA: Diagnosis not present

## 2017-08-06 DIAGNOSIS — I10 Essential (primary) hypertension: Secondary | ICD-10-CM | POA: Diagnosis not present

## 2017-08-06 DIAGNOSIS — J449 Chronic obstructive pulmonary disease, unspecified: Secondary | ICD-10-CM | POA: Diagnosis not present

## 2017-08-06 DIAGNOSIS — D225 Melanocytic nevi of trunk: Secondary | ICD-10-CM | POA: Diagnosis not present

## 2017-08-06 DIAGNOSIS — G629 Polyneuropathy, unspecified: Secondary | ICD-10-CM | POA: Diagnosis not present

## 2017-08-10 ENCOUNTER — Other Ambulatory Visit: Payer: Self-pay | Admitting: Internal Medicine

## 2017-08-10 DIAGNOSIS — I4891 Unspecified atrial fibrillation: Secondary | ICD-10-CM | POA: Diagnosis not present

## 2017-08-10 DIAGNOSIS — I10 Essential (primary) hypertension: Secondary | ICD-10-CM | POA: Diagnosis not present

## 2017-08-10 DIAGNOSIS — G629 Polyneuropathy, unspecified: Secondary | ICD-10-CM | POA: Diagnosis not present

## 2017-08-10 DIAGNOSIS — I87303 Chronic venous hypertension (idiopathic) without complications of bilateral lower extremity: Secondary | ICD-10-CM | POA: Diagnosis not present

## 2017-08-10 DIAGNOSIS — J449 Chronic obstructive pulmonary disease, unspecified: Secondary | ICD-10-CM | POA: Diagnosis not present

## 2017-08-10 DIAGNOSIS — M810 Age-related osteoporosis without current pathological fracture: Secondary | ICD-10-CM | POA: Diagnosis not present

## 2017-08-13 DIAGNOSIS — G629 Polyneuropathy, unspecified: Secondary | ICD-10-CM | POA: Diagnosis not present

## 2017-08-13 DIAGNOSIS — I4891 Unspecified atrial fibrillation: Secondary | ICD-10-CM | POA: Diagnosis not present

## 2017-08-13 DIAGNOSIS — I10 Essential (primary) hypertension: Secondary | ICD-10-CM | POA: Diagnosis not present

## 2017-08-13 DIAGNOSIS — M810 Age-related osteoporosis without current pathological fracture: Secondary | ICD-10-CM | POA: Diagnosis not present

## 2017-08-13 DIAGNOSIS — J449 Chronic obstructive pulmonary disease, unspecified: Secondary | ICD-10-CM | POA: Diagnosis not present

## 2017-08-13 DIAGNOSIS — I87303 Chronic venous hypertension (idiopathic) without complications of bilateral lower extremity: Secondary | ICD-10-CM | POA: Diagnosis not present

## 2017-08-17 DIAGNOSIS — G629 Polyneuropathy, unspecified: Secondary | ICD-10-CM | POA: Diagnosis not present

## 2017-08-17 DIAGNOSIS — I10 Essential (primary) hypertension: Secondary | ICD-10-CM | POA: Diagnosis not present

## 2017-08-17 DIAGNOSIS — I4891 Unspecified atrial fibrillation: Secondary | ICD-10-CM | POA: Diagnosis not present

## 2017-08-17 DIAGNOSIS — J449 Chronic obstructive pulmonary disease, unspecified: Secondary | ICD-10-CM | POA: Diagnosis not present

## 2017-08-17 DIAGNOSIS — M810 Age-related osteoporosis without current pathological fracture: Secondary | ICD-10-CM | POA: Diagnosis not present

## 2017-08-17 DIAGNOSIS — I87303 Chronic venous hypertension (idiopathic) without complications of bilateral lower extremity: Secondary | ICD-10-CM | POA: Diagnosis not present

## 2017-08-18 ENCOUNTER — Other Ambulatory Visit (HOSPITAL_COMMUNITY): Payer: Self-pay | Admitting: *Deleted

## 2017-08-19 ENCOUNTER — Ambulatory Visit (HOSPITAL_COMMUNITY)
Admission: RE | Admit: 2017-08-19 | Discharge: 2017-08-19 | Disposition: A | Discharge status: Home / Self Care | Payer: Medicare HMO | Source: Ambulatory Visit | Attending: Internal Medicine | Admitting: Internal Medicine

## 2017-08-19 DIAGNOSIS — M810 Age-related osteoporosis without current pathological fracture: Secondary | ICD-10-CM | POA: Insufficient documentation

## 2017-08-19 MED ORDER — ZOLEDRONIC ACID 5 MG/100ML IV SOLN: 5.0000 mg | Freq: Once | INTRAVENOUS | Status: DC

## 2017-08-19 MED ORDER — ZOLEDRONIC ACID 5 MG/100ML IV SOLN
INTRAVENOUS | Status: AC
  Administered 2017-08-19: 5 mg via INTRAVENOUS
  Filled 2017-08-19: qty 100

## 2017-08-20 DIAGNOSIS — I87303 Chronic venous hypertension (idiopathic) without complications of bilateral lower extremity: Secondary | ICD-10-CM | POA: Diagnosis not present

## 2017-08-20 DIAGNOSIS — G629 Polyneuropathy, unspecified: Secondary | ICD-10-CM | POA: Diagnosis not present

## 2017-08-20 DIAGNOSIS — M810 Age-related osteoporosis without current pathological fracture: Secondary | ICD-10-CM | POA: Diagnosis not present

## 2017-08-20 DIAGNOSIS — I4891 Unspecified atrial fibrillation: Secondary | ICD-10-CM | POA: Diagnosis not present

## 2017-08-20 DIAGNOSIS — J449 Chronic obstructive pulmonary disease, unspecified: Secondary | ICD-10-CM | POA: Diagnosis not present

## 2017-08-20 DIAGNOSIS — I10 Essential (primary) hypertension: Secondary | ICD-10-CM | POA: Diagnosis not present

## 2017-08-27 DIAGNOSIS — G629 Polyneuropathy, unspecified: Secondary | ICD-10-CM | POA: Diagnosis not present

## 2017-08-27 DIAGNOSIS — I10 Essential (primary) hypertension: Secondary | ICD-10-CM | POA: Diagnosis not present

## 2017-08-27 DIAGNOSIS — M810 Age-related osteoporosis without current pathological fracture: Secondary | ICD-10-CM | POA: Diagnosis not present

## 2017-08-27 DIAGNOSIS — I4891 Unspecified atrial fibrillation: Secondary | ICD-10-CM | POA: Diagnosis not present

## 2017-08-27 DIAGNOSIS — I87303 Chronic venous hypertension (idiopathic) without complications of bilateral lower extremity: Secondary | ICD-10-CM | POA: Diagnosis not present

## 2017-08-27 DIAGNOSIS — J449 Chronic obstructive pulmonary disease, unspecified: Secondary | ICD-10-CM | POA: Diagnosis not present

## 2017-09-01 DIAGNOSIS — H903 Sensorineural hearing loss, bilateral: Secondary | ICD-10-CM | POA: Diagnosis not present

## 2017-09-01 DIAGNOSIS — I87303 Chronic venous hypertension (idiopathic) without complications of bilateral lower extremity: Secondary | ICD-10-CM | POA: Diagnosis not present

## 2017-09-01 DIAGNOSIS — I10 Essential (primary) hypertension: Secondary | ICD-10-CM | POA: Diagnosis not present

## 2017-09-01 DIAGNOSIS — G629 Polyneuropathy, unspecified: Secondary | ICD-10-CM | POA: Diagnosis not present

## 2017-09-01 DIAGNOSIS — H6123 Impacted cerumen, bilateral: Secondary | ICD-10-CM | POA: Diagnosis not present

## 2017-09-01 DIAGNOSIS — M810 Age-related osteoporosis without current pathological fracture: Secondary | ICD-10-CM | POA: Diagnosis not present

## 2017-09-01 DIAGNOSIS — H6993 Unspecified Eustachian tube disorder, bilateral: Secondary | ICD-10-CM | POA: Diagnosis not present

## 2017-09-01 DIAGNOSIS — I4891 Unspecified atrial fibrillation: Secondary | ICD-10-CM | POA: Diagnosis not present

## 2017-09-01 DIAGNOSIS — J449 Chronic obstructive pulmonary disease, unspecified: Secondary | ICD-10-CM | POA: Diagnosis not present

## 2017-09-08 DIAGNOSIS — D1801 Hemangioma of skin and subcutaneous tissue: Secondary | ICD-10-CM | POA: Diagnosis not present

## 2017-09-08 DIAGNOSIS — D485 Neoplasm of uncertain behavior of skin: Secondary | ICD-10-CM | POA: Diagnosis not present

## 2017-09-08 DIAGNOSIS — L570 Actinic keratosis: Secondary | ICD-10-CM | POA: Diagnosis not present

## 2017-09-08 DIAGNOSIS — L821 Other seborrheic keratosis: Secondary | ICD-10-CM | POA: Diagnosis not present

## 2017-09-09 DIAGNOSIS — I4891 Unspecified atrial fibrillation: Secondary | ICD-10-CM | POA: Diagnosis not present

## 2017-09-09 DIAGNOSIS — G629 Polyneuropathy, unspecified: Secondary | ICD-10-CM | POA: Diagnosis not present

## 2017-09-09 DIAGNOSIS — M810 Age-related osteoporosis without current pathological fracture: Secondary | ICD-10-CM | POA: Diagnosis not present

## 2017-09-09 DIAGNOSIS — I10 Essential (primary) hypertension: Secondary | ICD-10-CM | POA: Diagnosis not present

## 2017-09-09 DIAGNOSIS — J449 Chronic obstructive pulmonary disease, unspecified: Secondary | ICD-10-CM | POA: Diagnosis not present

## 2017-09-09 DIAGNOSIS — I87303 Chronic venous hypertension (idiopathic) without complications of bilateral lower extremity: Secondary | ICD-10-CM | POA: Diagnosis not present

## 2017-09-15 DIAGNOSIS — I4891 Unspecified atrial fibrillation: Secondary | ICD-10-CM | POA: Diagnosis not present

## 2017-09-15 DIAGNOSIS — G629 Polyneuropathy, unspecified: Secondary | ICD-10-CM | POA: Diagnosis not present

## 2017-09-15 DIAGNOSIS — I87303 Chronic venous hypertension (idiopathic) without complications of bilateral lower extremity: Secondary | ICD-10-CM | POA: Diagnosis not present

## 2017-09-15 DIAGNOSIS — M810 Age-related osteoporosis without current pathological fracture: Secondary | ICD-10-CM | POA: Diagnosis not present

## 2017-09-15 DIAGNOSIS — J449 Chronic obstructive pulmonary disease, unspecified: Secondary | ICD-10-CM | POA: Diagnosis not present

## 2017-09-15 DIAGNOSIS — I10 Essential (primary) hypertension: Secondary | ICD-10-CM | POA: Diagnosis not present

## 2017-09-22 DIAGNOSIS — I4891 Unspecified atrial fibrillation: Secondary | ICD-10-CM | POA: Diagnosis not present

## 2017-09-22 DIAGNOSIS — M810 Age-related osteoporosis without current pathological fracture: Secondary | ICD-10-CM | POA: Diagnosis not present

## 2017-09-22 DIAGNOSIS — I10 Essential (primary) hypertension: Secondary | ICD-10-CM | POA: Diagnosis not present

## 2017-09-22 DIAGNOSIS — I87303 Chronic venous hypertension (idiopathic) without complications of bilateral lower extremity: Secondary | ICD-10-CM | POA: Diagnosis not present

## 2017-09-22 DIAGNOSIS — G629 Polyneuropathy, unspecified: Secondary | ICD-10-CM | POA: Diagnosis not present

## 2017-09-22 DIAGNOSIS — J449 Chronic obstructive pulmonary disease, unspecified: Secondary | ICD-10-CM | POA: Diagnosis not present

## 2017-10-08 DIAGNOSIS — J449 Chronic obstructive pulmonary disease, unspecified: Secondary | ICD-10-CM | POA: Diagnosis not present

## 2017-10-08 DIAGNOSIS — G6289 Other specified polyneuropathies: Secondary | ICD-10-CM | POA: Diagnosis not present

## 2017-10-08 DIAGNOSIS — R7309 Other abnormal glucose: Secondary | ICD-10-CM | POA: Diagnosis not present

## 2017-10-08 DIAGNOSIS — I10 Essential (primary) hypertension: Secondary | ICD-10-CM | POA: Diagnosis not present

## 2017-10-08 DIAGNOSIS — I480 Paroxysmal atrial fibrillation: Secondary | ICD-10-CM | POA: Diagnosis not present

## 2017-10-08 DIAGNOSIS — M810 Age-related osteoporosis without current pathological fracture: Secondary | ICD-10-CM | POA: Diagnosis not present

## 2017-10-08 DIAGNOSIS — R0609 Other forms of dyspnea: Secondary | ICD-10-CM | POA: Diagnosis not present

## 2017-10-08 DIAGNOSIS — L818 Other specified disorders of pigmentation: Secondary | ICD-10-CM | POA: Diagnosis not present

## 2017-10-08 DIAGNOSIS — E038 Other specified hypothyroidism: Secondary | ICD-10-CM | POA: Diagnosis not present

## 2017-11-23 ENCOUNTER — Other Ambulatory Visit: Payer: Self-pay | Admitting: Internal Medicine

## 2018-01-22 ENCOUNTER — Telehealth: Payer: Self-pay | Admitting: Internal Medicine

## 2018-01-22 NOTE — Telephone Encounter (Signed)
Received eliquis patient assistance in mail from patient. MD completed/signed app and it will be mailed back to patient, per her request. Per her note, she requested a 90 day supply script for 1 year which was noted on application as #[phone] for 90 day supply with 3 refills. She also recently received Rx refill sent to [facility] (see below)  ELIQUIS 2.5 MG TABS tablet 180 tablet 1 11/23/2017    Sig: TAKE 1 TABLET BY MOUTH TWICE DAILY   Sent to pharmacy as: Arne Cleveland 2.5 MG Tab tablet   E-Prescribing Status: Receipt confirmed by pharmacy (11/23/2017 9:01 AM EDT)   Pharmacy   [place] ([place]), [place]

## 2018-01-25 ENCOUNTER — Encounter (HOSPITAL_COMMUNITY): Payer: Self-pay | Admitting: *Deleted

## 2018-01-25 ENCOUNTER — Emergency Department (HOSPITAL_COMMUNITY): Payer: Medicare HMO

## 2018-01-25 ENCOUNTER — Emergency Department (HOSPITAL_COMMUNITY)
Admission: EM | Admit: 2018-01-25 | Discharge: 2018-01-26 | Disposition: A | Discharge status: Home / Self Care | Payer: Medicare HMO | Attending: Emergency Medicine | Admitting: Emergency Medicine

## 2018-01-25 DIAGNOSIS — I10 Essential (primary) hypertension: Secondary | ICD-10-CM | POA: Insufficient documentation

## 2018-01-25 DIAGNOSIS — M549 Dorsalgia, unspecified: Secondary | ICD-10-CM | POA: Diagnosis not present

## 2018-01-25 DIAGNOSIS — S22080A Wedge compression fracture of T11-T12 vertebra, initial encounter for closed fracture: Secondary | ICD-10-CM | POA: Diagnosis not present

## 2018-01-25 DIAGNOSIS — Z7901 Long term (current) use of anticoagulants: Secondary | ICD-10-CM | POA: Diagnosis not present

## 2018-01-25 DIAGNOSIS — R110 Nausea: Secondary | ICD-10-CM | POA: Diagnosis not present

## 2018-01-25 DIAGNOSIS — R1084 Generalized abdominal pain: Secondary | ICD-10-CM | POA: Diagnosis not present

## 2018-01-25 DIAGNOSIS — J439 Emphysema, unspecified: Secondary | ICD-10-CM | POA: Diagnosis not present

## 2018-01-25 DIAGNOSIS — Z87891 Personal history of nicotine dependence: Secondary | ICD-10-CM | POA: Insufficient documentation

## 2018-01-25 DIAGNOSIS — J449 Chronic obstructive pulmonary disease, unspecified: Secondary | ICD-10-CM | POA: Insufficient documentation

## 2018-01-25 DIAGNOSIS — M4854XA Collapsed vertebra, not elsewhere classified, thoracic region, initial encounter for fracture: Secondary | ICD-10-CM

## 2018-01-25 DIAGNOSIS — R1010 Upper abdominal pain, unspecified: Secondary | ICD-10-CM | POA: Diagnosis not present

## 2018-01-25 DIAGNOSIS — Z79899 Other long term (current) drug therapy: Secondary | ICD-10-CM | POA: Insufficient documentation

## 2018-01-25 LAB — URINALYSIS, ROUTINE W REFLEX MICROSCOPIC
BILIRUBIN URINE: NEGATIVE
GLUCOSE, UA: NEGATIVE mg/dL
Hgb urine dipstick: NEGATIVE
KETONES UR: NEGATIVE mg/dL
Leukocytes, UA: NEGATIVE
NITRITE: NEGATIVE
PH: 7 (ref 5.0–8.0)
Protein, ur: NEGATIVE mg/dL
Specific Gravity, Urine: 1.015 (ref 1.005–1.030)

## 2018-01-25 LAB — CBC
HCT: 40.9 % (ref 36.0–46.0)
Hemoglobin: 13.8 g/dL (ref 12.0–15.0)
MCH: 31.7 pg (ref 26.0–34.0)
MCHC: 33.7 g/dL (ref 30.0–36.0)
MCV: 93.8 fL (ref 80.0–100.0)
Platelets: 276 10*3/uL (ref 150–400)
RBC: 4.36 MIL/uL (ref 3.87–5.11)
RDW: 13.5 % (ref 11.5–15.5)
WBC: 9.4 10*3/uL (ref 4.0–10.5)
nRBC: 0 % (ref 0.0–0.2)

## 2018-01-25 LAB — COMPREHENSIVE METABOLIC PANEL
ALK PHOS: 69 U/L (ref 38–126)
ALT: 28 U/L (ref 0–44)
AST: 31 U/L (ref 15–41)
Albumin: 3.9 g/dL (ref 3.5–5.0)
Anion gap: 9 (ref 5–15)
BUN: 17 mg/dL (ref 8–23)
CO2: 26 mmol/L (ref 22–32)
CREATININE: 0.66 mg/dL (ref 0.44–1.00)
Calcium: 9.5 mg/dL (ref 8.9–10.3)
Chloride: 101 mmol/L (ref 98–111)
GFR calc Af Amer: >60 mL/min (ref >60)
Glucose, Bld: 121 mg/dL — ABNORMAL HIGH (ref 70–99)
Potassium: 3.6 mmol/L (ref 3.5–5.1)
Sodium: 136 mmol/L (ref 135–145)
Total Bilirubin: 1 mg/dL (ref 0.3–1.2)
Total Protein: 6.1 g/dL — ABNORMAL LOW (ref 6.5–8.1)

## 2018-01-25 LAB — I-STAT TROPONIN, ED: Troponin i, poc: 0 ng/mL (ref 0.00–0.08)

## 2018-01-25 LAB — LIPASE, BLOOD: LIPASE: 37 U/L (ref 11–51)

## 2018-01-25 MED ORDER — FENTANYL CITRATE (PF) 100 MCG/2ML IJ SOLN
50.0000 ug | Freq: Once | INTRAMUSCULAR | Status: AC
  Administered 2018-01-25: 50 ug via INTRAVENOUS
  Filled 2018-01-25: qty 2

## 2018-01-25 MED ORDER — IOHEXOL 300 MG/ML  SOLN
100.0000 mL | Freq: Once | INTRAMUSCULAR | Status: AC | PRN
  Administered 2018-01-25: 100 mL via INTRAVENOUS

## 2018-01-25 MED ORDER — ONDANSETRON HCL 4 MG/2ML IJ SOLN
4.0000 mg | Freq: Once | INTRAMUSCULAR | Status: AC
  Administered 2018-01-25: 4 mg via INTRAVENOUS
  Filled 2018-01-25: qty 2

## 2018-01-25 NOTE — ED Notes (Signed)
Pt medicated for pain and nausea.  Ortho tech called for TLSO brace, per tech if pt goes home rep will come in if pt is admitted brace will be fitted tomorrow.

## 2018-01-25 NOTE — ED Provider Notes (Signed)
[facility] EMERGENCY DEPARTMENT Provider Note   CSN: [id number] Arrival date & time: 01/25/18  1618     History   Chief Complaint Chief Complaint  Patient presents with  . Abdominal Pain  . Back Pain    HPI Heather [name] is a 84 y.o. female.  HPI   84 year old female with history of atrial fibrillation, COPD, hypertension who presents with concern for abdominal pain and back pain.  Patient reports that about mid morning, she began to have severe upper abdominal pain and upper back pain with associated nausea.  Reports she has shortness of breath which is chronic and unchanged.  Denies chest pain.  The pain is severe, and sharp.  If she lays still, she feels better, but with any small movements, she has severe pain in the back radiating to the abdomen.  No urinary symptoms, no fevers, no diarrhea or constipation. Denies leg weakness, loss of control of bowel or bladder  Past Medical History:  Diagnosis Date  . Anxiety   . Arthritis   . Atrial fibrillation ([place])   . COPD (chronic obstructive pulmonary disease) ([place])   . Dysrhythmia    AF  . Essential hypertension, benign   . Gallstones   . GERD (gastroesophageal reflux disease)   . H/O hiatal hernia   . History of nuclear stress test 10/2010   dipyridamole; normal pattern of perfusion; low risk, normal study  . 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 Mild aortic insufficiency   . Osteoporosis   . Peripheral neuropathy   . Pneumonia    states she had it twice, last time a couple of months ago  . PONV (postoperative nausea and vomiting)   . Shortness of breath    with exertion  . Unspecified hypothyroidism   . Venous insufficiency     Patient Active Problem List   Diagnosis Date Noted  . Skin avulsion 09/19/2016  . Influenza-like illness 04/16/2016  . Sepsis ([place]) 04/16/2016  . Hypokalemia 04/16/2016  . Hypothyroidism 04/16/2016  . Post herpetic neuralgia  06/06/2014  . Shingles outbreak 04/19/2014  . Gallstones 09/20/2013  . Community acquired pneumonia 05/17/2013  . COPD (chronic obstructive pulmonary disease) ([place]) 05/17/2013  . Dyspnea on exertion 04/25/2013  . Pulmonary emphysema ([place]) 11/15/2012  . HTN (hypertension) 11/15/2012  . PAF (paroxysmal atrial fibrillation) ([place]) 06/14/2012  . Long term current use of anticoagulant therapy 06/14/2012  . Ventral hernia 07/31/2011  . Abdominal wall mass 07/01/2011    Past Surgical History:  Procedure Laterality Date  . APPENDECTOMY  1935  . BACK SURGERY     x2  . Cardiometablic Testing  04/18/4172   submaximal effort with peak RER of 0.5, peak VO2 79% predicted; HR peak up to 78%; PVC was 55% predicted, PEV1 39% predicted; PEV1/VC ratio was reduced; normal vital capacity; DLCO was reduced to 63%  . CARPAL TUNNEL RELEASE    . CATARACT EXTRACTION W/ INTRAOCULAR LENS  IMPLANT, BILATERAL    . CHOLECYSTECTOMY  04/07/2014   dr toth  . CHOLECYSTECTOMY N/A 04/07/2014   Procedure: LAPAROSCOPIC CHOLECYSTECTOMY WITH INTRAOPERATIVE CHOLANGIOGRAM POSSBILE OPEN;  Surgeon: Autumn Messing III, MD;  Location: [facility];  Service: General;  Laterality: N/A;  . COLON SURGERY  2008   partial  . ESOPHAGOGASTRODUODENOSCOPY (EGD) WITH PROPOFOL N/A 05/25/2017   Procedure: ESOPHAGOGASTRODUODENOSCOPY (EGD) WITH PROPOFOL;  Surgeon: Clarene Essex, MD;  Location: [facility];  Service: Endoscopy;  Laterality: N/A;  . HERNIA REPAIR    .  JOINT REPLACEMENT    . left hip relacement  2000  . SAVORY DILATION N/A 05/25/2017   Procedure: SAVORY DILATION;  Surgeon: Clarene Essex, MD;  Location: [facility] ENDOSCOPY;  Service: Endoscopy;  Laterality: N/A;  . TONSILLECTOMY  1935  . TOTAL ABDOMINAL HYSTERECTOMY  1970  . TRANSTHORACIC ECHOCARDIOGRAM  05/2011   EF=>55%; mild conc LVH; mild mitral annular calcif; mild TR; AV mildly sclerotic & mild AR  . VENTRAL HERNIA REPAIR  09/19/2011   Procedure: LAPAROSCOPIC VENTRAL HERNIA;  Surgeon: Merrie Roof, MD;  Location: [facility];  Service: General;  Laterality: N/A;  laparoscopic ventral hernia repair with mesh     OB History   None      Home Medications    Prior to Admission medications   Medication Sig Start Date End Date Taking? Authorizing Provider  albuterol (PROVENTIL HFA;VENTOLIN HFA) 108 (90 BASE) MCG/ACT inhaler Inhale 2 puffs into the lungs every 6 (six) hours as needed for wheezing or shortness of breath. 05/23/13  Yes Burnard Bunting, MD  budesonide-formoterol [facility]) 160-4.5 MCG/ACT inhaler Inhale 2 puffs into the lungs 2 (two) times daily.    Yes [provider]  calcium citrate-vitamin D (CITRACAL+D) 315-200 MG-UNIT per tablet Take 1 tablet by mouth daily.   Yes [provider]  Carboxymethylcellulose Sodium (THERATEARS OP) Place 2 drops into both eyes 2 (two) times daily.   Yes [provider]  diltiazem (CARDIZEM CD) 180 MG 24 hr capsule Take 180 mg by mouth daily.   Yes [provider]  ELIQUIS 2.5 MG TABS tablet TAKE 1 TABLET BY MOUTH TWICE DAILY Patient taking differently: Take 2.5 mg by mouth 2 (two) times daily.  11/23/17  Yes Hilty, Nadean Corwin, MD  furosemide (LASIX) 40 MG tablet Take 1 tablet (40 mg total) by mouth as needed. 07/15/17  Yes Almyra Deforest, PA  hydrochlorothiazide (HYDRODIURIL) 25 MG tablet Take 1 tablet (25 mg total) by mouth daily. 07/15/17 01/25/18 Yes Almyra Deforest, PA  Lactobacillus Reuteri (BIOGAIA PROBIOTIC PO) Take 1 capsule by mouth daily.   Yes [provider]  levothyroxine (SYNTHROID, LEVOTHROID) 137 MCG tablet Take 137 mcg by mouth daily before breakfast.    Yes [provider]  losartan (COZAAR) 100 MG tablet TAKE 1 TABLET EVERY DAY  (KEEP  OFFICE  VISIT) Patient taking differently: Take 100 mg by mouth daily.  08/11/17  Yes Hilty, Nadean Corwin, MD  Multiple Vitamins-Minerals (MULTIVITAMIN WITH MINERALS) tablet Take 1 tablet by mouth daily.   Yes [provider]  polyethylene glycol  (MIRALAX / GLYCOLAX) packet Take 17 g by mouth every other day.    Yes [provider]  gabapentin (NEURONTIN) 300 MG capsule Take 1 capsule (300 mg total) by mouth daily. Patient not taking: Reported on 05/14/2017 03/03/17   Almyra Deforest, PA  potassium chloride (KLOR-CON) 8 MEQ tablet Take 1 tablet (8 mEq total) by mouth 2 (two) times daily. Patient not taking: Reported on 05/14/2017 03/03/17   Almyra Deforest, PA  promethazine-codeine [facility] WITH CODEINE) 6.25-10 MG/5ML syrup Take 5 mLs by mouth every 6 (six) hours as needed for cough. Patient not taking: Reported on 05/14/2017 04/19/16   Dessa Phi, DO    Family History Family History  Problem Relation Age of Onset  . Stroke Mother   . Stroke Father   . Heart block Brother   . Bladder Cancer Brother   . Diabetes Brother   . Stroke Brother     Social History Social History  Tobacco Use  . Smoking status: Former Smoker    Packs/day: 1.00    Years: 20.00    Pack years: 20.00    Types: Cigarettes    Last attempt to quit: 09/12/1978    Years since quitting: 39.4  . Smokeless tobacco: Never Used  Substance Use Topics  . Alcohol use: Yes    Alcohol/week: 0.0 standard drinks    Comment: occ  . Drug use: No     Allergies   Ace inhibitors and Sulfur   Review of Systems Review of Systems  Constitutional: Negative for fever.  HENT: Negative for sore throat.   Eyes: Negative for visual disturbance.  Respiratory: Negative for cough and shortness of breath.   Cardiovascular: Negative for chest pain.  Gastrointestinal: Positive for abdominal pain and nausea. Negative for constipation, diarrhea and vomiting.  Genitourinary: Negative for difficulty urinating and dysuria.  Musculoskeletal: Positive for back pain. Negative for neck pain.  Skin: Negative for rash.  Neurological: Negative for syncope and headaches.     Physical Exam Updated Vital Signs BP (!) 112/52   Pulse 76   Temp 97.9 F (36.6 C) (Oral)   Resp (!)  22   SpO2 93%   Physical Exam  Constitutional: She is oriented to person, place, and time. She appears well-developed and well-nourished. No distress.  HENT:  Head: Normocephalic and atraumatic.  Eyes: Conjunctivae and EOM are normal.  Neck: Normal range of motion.  Cardiovascular: Normal rate, regular rhythm, normal heart sounds and intact distal pulses. Exam reveals no gallop and no friction rub.  No murmur heard. Pulmonary/Chest: Effort normal and breath sounds normal. No respiratory distress. She has no wheezes. She has no rales.  Abdominal: Soft. She exhibits no distension. There is tenderness in the epigastric area and suprapubic area. There is no guarding.  Musculoskeletal: She exhibits no edema.       Lumbar back: She exhibits tenderness.  Neurological: She is alert and oriented to person, place, and time.  Skin: Skin is warm and dry. No rash noted. She is not diaphoretic. No erythema.  Nursing note and vitals reviewed.    ED Treatments / Results  Labs (all labs ordered are listed, but only abnormal results are displayed) Labs Reviewed  COMPREHENSIVE METABOLIC PANEL - Abnormal; Notable for the following components:      Result Value   Glucose, Bld 121 (*)    Total Protein 6.1 (*)    All other components within normal limits  LIPASE, BLOOD  CBC  URINALYSIS, ROUTINE W REFLEX MICROSCOPIC  I-STAT TROPONIN, ED    EKG EKG Interpretation  Date/Time:  Monday January 25 2018 16:26:44 EDT Ventricular Rate:  98 PR Interval:  150 QRS Duration: 76 QT Interval:  354 QTC Calculation: 451 R Axis:   79 Text Interpretation:  Sinus rhythm with occasional Premature ventricular complexes Possible Left atrial enlargement Anterior infarct , age undetermined Abnormal ECG No significant change since last tracing Confirmed by Gareth Morgan [phone]) on 01/25/2018 6:18:57 PM   Radiology Dg Chest 2 View  Result Date: 01/25/2018 CLINICAL DATA:  Shortness of breath EXAM: CHEST - 2  VIEW COMPARISON:  06/25/2016 and 04/16/2016 FINDINGS: Right posterior hemidiaphragmatic hernia as on prior exams. Emphysema. Atherosclerotic calcification of the aortic arch. Heart size within normal limits.  The lungs appear otherwise clear. T11 compression fracture observed and similar to 07/15/2016 with some questionable subluxation at the T10-11 level, although some of this appearance may be primarily from bony retropulsion. Old T8 compression fracture.  Old left lower rib deformities from prior fractures. Degenerative glenohumeral arthropathy. IMPRESSION: 1.  Emphysema (ICD10-J43.9). 2.  Aortic Atherosclerosis (ICD10-I70.0). 3. Chronic compressions at T11 and T8. Difficult to exclude some mild subluxation at the T10-11 level although much of this appearance may be due to the bony retropulsion at T11. 4. Large right posterior hemidiaphragmatic hernia as on prior exams. Electronically Signed   By: Van Clines M.D.   On: 01/25/2018 18:06   Ct Abdomen Pelvis W Contrast  Result Date: 01/25/2018 CLINICAL DATA:  84 y/o F; upper abdominal and upper back pain with onset this morning. EXAM: CT ABDOMEN AND PELVIS WITH CONTRAST TECHNIQUE: Multidetector CT imaging of the abdomen and pelvis was performed using the standard protocol following bolus administration of intravenous contrast. CONTRAST:  140mL OMNIPAQUE IOHEXOL 300 MG/ML  SOLN COMPARISON:  08/04/2017, 06/23/2016, 03/09/2015, 06/17/2013 CT abdomen and pelvis FINDINGS: Lower chest: No acute abnormality. Hepatobiliary: Stable 7 mm cyst within the dome of right liver. No additional focal liver abnormality is seen. Status post cholecystectomy. Stable mild intrahepatic and extrahepatic biliary ductal dilatation is likely compensatory post cholecystectomy. Pancreas: Unremarkable. No pancreatic ductal dilatation or surrounding inflammatory changes. Spleen: Normal in size without focal abnormality. Adrenals/Urinary Tract: Normal adrenal glands. Stable scattered  subcentimeter cysts bilaterally. No urinary stone disease or hydronephrosis. Normal bladder. Stomach/Bowel: Stomach is within normal limits. Appendix not identified, no pericecal inflammation. No evidence of bowel wall thickening, distention, or inflammatory changes. Vascular/Lymphatic: Aortic atherosclerosis. No enlarged abdominal or pelvic lymph nodes. Reproductive: Status post hysterectomy. No adnexal masses. Other: No abdominal wall hernia or abnormality. No abdominopelvic ascites. Musculoskeletal: Chronic T11 and L2 compression deformity. Total left hip arthroplasty, partially visualized. Chronic L4-5 and L5-S1 interbody fusion with laminectomy. Osteoarthrosis of the right hip joint with loss of joint space and periarticular osteophytosis. Bones are diffusely demineralized. IMPRESSION: 1. No acute process identified as explanation for abdominal pain. 2. Stable chronic findings as above. Electronically Signed   By: Kristine Garbe M.D.   On: 01/25/2018 22:58    Procedures Procedures (including critical care time)  Medications Ordered in ED Medications  lidocaine (LIDODERM) 5 % 1 patch (1 patch Transdermal Patch Applied 01/26/18 0131)  fentaNYL (DURAGESIC - dosed mcg/hr) patch 25 mcg (25 mcg Transdermal Patch Applied 01/26/18 0131)  fentaNYL (SUBLIMAZE) injection 50 mcg (50 mcg Intravenous Given 01/25/18 2043)  ondansetron (ZOFRAN) injection 4 mg (4 mg Intravenous Given 01/25/18 2040)  iohexol (OMNIPAQUE) 300 MG/ML solution 100 mL (100 mLs Intravenous Contrast Given 01/25/18 2223)     Initial Impression / Assessment and Plan / ED Course  I have reviewed the triage vital signs and the nursing notes.  Pertinent labs & imaging results that were available during my care of the patient were reviewed by me and considered in my medical decision making (see chart for details).     84 year old female with history of atrial fibrillation, COPD, hypertension who presents with concern for  abdominal pain and back pain.    CT shows no sign of AAA, appendicitis, SBO, or signs of bowel abnormalities.  No sign of cholangitis. Low suspicion for mesenteric ischemia by history, patient on Eliquis, and CT without abnormalities.  Urinalysis without signs of UTI.  No signs of pancreatitis on labs.  No chest pain, but given epigastric pain ekg and troponin done and show no acute abnormalities. No acute dyspnea, doubt PE.  Suspect patient's abdominal pain and back pain are related to her low thoracic compression fractures.  Reports she has had similar  symptoms in the past.  Given severity of pain at this time, reviewed patient in the [facility] drug database, and found her to have no prescriptions in the last year, I will give her a prescription for fentanyl patch and lidocaine patches and recommend follow-up with spine surgery. Ordered TLSO for comfort and recommend follow up with [facility].    Final Clinical Impressions(s) / ED Diagnoses   Final diagnoses:  Non-traumatic compression fracture of eleventh thoracic vertebra, initial encounter [facility])  Generalized abdominal pain    ED Discharge Orders    None       Gareth Morgan, MD 01/26/18 [phone]

## 2018-01-25 NOTE — ED Triage Notes (Signed)
Pt in c/o upper abdominal pain and upper back pain that started this morning, pt appears uncomfortable in triage, also shortness of breath with nausea

## 2018-01-25 NOTE — ED Notes (Signed)
Patient transported to X-ray 

## 2018-01-26 MED ORDER — OXYCODONE HCL 5 MG PO TABS
5.0000 mg | ORAL_TABLET | Freq: Once | ORAL | Status: DC
  Filled 2018-01-26: qty 1

## 2018-01-26 MED ORDER — FENTANYL 12 MCG/HR TD PT72: 12.0000 ug | MEDICATED_PATCH | TRANSDERMAL | 0 refills | Status: DC

## 2018-01-26 MED ORDER — FENTANYL 25 MCG/HR TD PT72
25.0000 ug | MEDICATED_PATCH | TRANSDERMAL | Status: DC
  Administered 2018-01-26: 25 ug via TRANSDERMAL
  Filled 2018-01-26: qty 1

## 2018-01-26 MED ORDER — FENTANYL 12 MCG/HR TD PT72: 75.0000 ug | MEDICATED_PATCH | TRANSDERMAL | Status: DC

## 2018-01-26 MED ORDER — LIDOCAINE 5 % EX PTCH
1.0000 | MEDICATED_PATCH | CUTANEOUS | Status: DC
  Administered 2018-01-26: 1 via TRANSDERMAL
  Filled 2018-01-26: qty 1

## 2018-01-26 MED ORDER — LIDOCAINE 5 % EX PTCH: 1.0000 | MEDICATED_PATCH | CUTANEOUS | 0 refills | Status: DC

## 2018-01-26 NOTE — ED Notes (Signed)
Representative at bedside for TLSO brace fitting

## 2018-01-26 NOTE — ED Notes (Signed)
biomed called eta of 45 mins

## 2018-01-27 DIAGNOSIS — M810 Age-related osteoporosis without current pathological fracture: Secondary | ICD-10-CM | POA: Diagnosis not present

## 2018-01-27 DIAGNOSIS — S22009G Unspecified fracture of unspecified thoracic vertebra, subsequent encounter for fracture with delayed healing: Secondary | ICD-10-CM | POA: Diagnosis not present

## 2018-01-27 DIAGNOSIS — M5489 Other dorsalgia: Secondary | ICD-10-CM | POA: Diagnosis not present

## 2018-01-27 DIAGNOSIS — Z681 Body mass index (BMI) 19 or less, adult: Secondary | ICD-10-CM | POA: Diagnosis not present

## 2018-01-27 DIAGNOSIS — M549 Dorsalgia, unspecified: Secondary | ICD-10-CM | POA: Insufficient documentation

## 2018-01-28 ENCOUNTER — Other Ambulatory Visit: Payer: Self-pay | Admitting: Registered Nurse

## 2018-01-28 DIAGNOSIS — S22008G Other fracture of unspecified thoracic vertebra, subsequent encounter for fracture with delayed healing: Secondary | ICD-10-CM

## 2018-01-28 DIAGNOSIS — M545 Low back pain, unspecified: Secondary | ICD-10-CM

## 2018-01-28 DIAGNOSIS — M810 Age-related osteoporosis without current pathological fracture: Secondary | ICD-10-CM

## 2018-01-31 ENCOUNTER — Ambulatory Visit
Admission: RE | Admit: 2018-01-31 | Discharge: 2018-01-31 | Disposition: A | Discharge status: Home / Self Care | Payer: Medicare HMO | Source: Ambulatory Visit | Attending: Registered Nurse | Admitting: Registered Nurse

## 2018-01-31 DIAGNOSIS — M545 Low back pain, unspecified: Secondary | ICD-10-CM

## 2018-01-31 DIAGNOSIS — M810 Age-related osteoporosis without current pathological fracture: Secondary | ICD-10-CM

## 2018-01-31 DIAGNOSIS — S22008G Other fracture of unspecified thoracic vertebra, subsequent encounter for fracture with delayed healing: Secondary | ICD-10-CM

## 2018-01-31 DIAGNOSIS — S22070D Wedge compression fracture of T9-T10 vertebra, subsequent encounter for fracture with routine healing: Secondary | ICD-10-CM | POA: Diagnosis not present

## 2018-02-03 ENCOUNTER — Other Ambulatory Visit: Payer: Self-pay | Admitting: Internal Medicine

## 2018-02-03 DIAGNOSIS — M549 Dorsalgia, unspecified: Principal | ICD-10-CM

## 2018-02-03 DIAGNOSIS — S22009A Unspecified fracture of unspecified thoracic vertebra, initial encounter for closed fracture: Secondary | ICD-10-CM | POA: Diagnosis not present

## 2018-02-03 DIAGNOSIS — S22019G Unspecified fracture of first thoracic vertebra, subsequent encounter for fracture with delayed healing: Secondary | ICD-10-CM

## 2018-02-03 DIAGNOSIS — G8929 Other chronic pain: Secondary | ICD-10-CM

## 2018-03-11 DIAGNOSIS — M810 Age-related osteoporosis without current pathological fracture: Secondary | ICD-10-CM | POA: Diagnosis not present

## 2018-03-11 DIAGNOSIS — Z681 Body mass index (BMI) 19 or less, adult: Secondary | ICD-10-CM | POA: Diagnosis not present

## 2018-03-13 ENCOUNTER — Other Ambulatory Visit: Payer: Self-pay | Admitting: Physician Assistant

## 2018-03-24 ENCOUNTER — Other Ambulatory Visit: Payer: Self-pay

## 2018-03-24 ENCOUNTER — Emergency Department (HOSPITAL_COMMUNITY): Payer: Medicare HMO

## 2018-03-24 ENCOUNTER — Encounter (HOSPITAL_COMMUNITY): Payer: Self-pay | Admitting: Emergency Medicine

## 2018-03-24 ENCOUNTER — Emergency Department (HOSPITAL_COMMUNITY)
Admission: EM | Admit: 2018-03-24 | Discharge: 2018-03-24 | Disposition: A | Discharge status: Home / Self Care | Payer: Medicare HMO | Attending: Emergency Medicine | Admitting: Emergency Medicine

## 2018-03-24 DIAGNOSIS — E039 Hypothyroidism, unspecified: Secondary | ICD-10-CM | POA: Diagnosis not present

## 2018-03-24 DIAGNOSIS — R110 Nausea: Secondary | ICD-10-CM | POA: Diagnosis not present

## 2018-03-24 DIAGNOSIS — J441 Chronic obstructive pulmonary disease with (acute) exacerbation: Secondary | ICD-10-CM | POA: Insufficient documentation

## 2018-03-24 DIAGNOSIS — I10 Essential (primary) hypertension: Secondary | ICD-10-CM | POA: Insufficient documentation

## 2018-03-24 DIAGNOSIS — Z7901 Long term (current) use of anticoagulants: Secondary | ICD-10-CM | POA: Insufficient documentation

## 2018-03-24 DIAGNOSIS — Z87891 Personal history of nicotine dependence: Secondary | ICD-10-CM | POA: Diagnosis not present

## 2018-03-24 DIAGNOSIS — Z79899 Other long term (current) drug therapy: Secondary | ICD-10-CM | POA: Diagnosis not present

## 2018-03-24 DIAGNOSIS — R0602 Shortness of breath: Secondary | ICD-10-CM | POA: Diagnosis not present

## 2018-03-24 DIAGNOSIS — I959 Hypotension, unspecified: Secondary | ICD-10-CM | POA: Diagnosis not present

## 2018-03-24 DIAGNOSIS — R0902 Hypoxemia: Secondary | ICD-10-CM | POA: Diagnosis not present

## 2018-03-24 LAB — BASIC METABOLIC PANEL
Anion gap: 10 (ref 5–15)
BUN: 21 mg/dL (ref 8–23)
CO2: 29 mmol/L (ref 22–32)
Calcium: 9.7 mg/dL (ref 8.9–10.3)
Chloride: 94 mmol/L — ABNORMAL LOW (ref 98–111)
Creatinine, Ser: 0.68 mg/dL (ref 0.44–1.00)
GFR calc Af Amer: >60 mL/min (ref >60)
GFR calc non Af Amer: >60 mL/min (ref >60)
Glucose, Bld: 101 mg/dL — ABNORMAL HIGH (ref 70–99)
Potassium: 4.2 mmol/L (ref 3.5–5.1)
Sodium: 133 mmol/L — ABNORMAL LOW (ref 135–145)

## 2018-03-24 LAB — CBC WITH DIFFERENTIAL/PLATELET
Abs Immature Granulocytes: 0.02 10*3/uL (ref 0.00–0.07)
Basophils Absolute: 0.1 10*3/uL (ref 0.0–0.1)
Basophils Relative: 1 %
Eosinophils Absolute: 0.3 10*3/uL (ref 0.0–0.5)
Eosinophils Relative: 4 %
HCT: 40.3 % (ref 36.0–46.0)
Hemoglobin: 14 g/dL (ref 12.0–15.0)
Immature Granulocytes: 0 %
Lymphocytes Relative: 31 %
Lymphs Abs: 2.5 10*3/uL (ref 0.7–4.0)
MCH: 31.3 pg (ref 26.0–34.0)
MCHC: 34.7 g/dL (ref 30.0–36.0)
MCV: 90 fL (ref 80.0–100.0)
Monocytes Absolute: 0.6 10*3/uL (ref 0.1–1.0)
Monocytes Relative: 8 %
Neutro Abs: 4.5 10*3/uL (ref 1.7–7.7)
Neutrophils Relative %: 56 %
Platelets: 299 10*3/uL (ref 150–400)
RBC: 4.48 MIL/uL (ref 3.87–5.11)
RDW: 13.3 % (ref 11.5–15.5)
WBC: 7.9 10*3/uL (ref 4.0–10.5)
nRBC: 0 % (ref 0.0–0.2)

## 2018-03-24 LAB — BRAIN NATRIURETIC PEPTIDE: B Natriuretic Peptide: 32.1 pg/mL (ref 0.0–100.0)

## 2018-03-24 MED ORDER — IPRATROPIUM-ALBUTEROL 0.5-2.5 (3) MG/3ML IN SOLN
3.0000 mL | Freq: Once | RESPIRATORY_TRACT | Status: AC
  Administered 2018-03-24: 3 mL via RESPIRATORY_TRACT
  Filled 2018-03-24: qty 3

## 2018-03-24 MED ORDER — METHYLPREDNISOLONE SODIUM SUCC 125 MG IJ SOLR
80.0000 mg | Freq: Once | INTRAMUSCULAR | Status: AC
  Administered 2018-03-24: 80 mg via INTRAVENOUS
  Filled 2018-03-24: qty 2

## 2018-03-24 MED ORDER — PREDNISONE 20 MG PO TABS: 40.0000 mg | ORAL_TABLET | Freq: Every day | ORAL | 0 refills | Status: DC

## 2018-03-24 NOTE — ED Notes (Signed)
Patient transported to X-ray 

## 2018-03-24 NOTE — ED Notes (Signed)
Pt ambulatory with walker per baseline, with only standby assistance. Pt reports feeling somewhat weak, but no c/o incr'd SOB with ambulation. SpO2 >91% on RA throughout.

## 2018-03-24 NOTE — ED Triage Notes (Signed)
Pt presents to ED via [facility] from home. Pt complains of SOB x1 week. Pt states it is worse with exertion. Pt has 55% lung capacity.  BP 112/70 HR 80 O2 98% RA CBG 194 RR 18

## 2018-03-24 NOTE — ED Provider Notes (Signed)
[facility] EMERGENCY DEPARTMENT Provider Note   CSN: [id number] Arrival date & time: 03/24/18  1643     History   Chief Complaint Chief Complaint  Patient presents with  . Shortness of Breath    HPI Heather [name] is a 84 y.o. female.  HPI   84 year old female with cough and shortness of breath.  Worsening over the past week or so.  Worse with activity.  Occasional nonproductive cough.  No fevers or chills.  No unusual leg pain or swelling.  No orthopnea.  She reports compliance with her medications.  History of COPD.  Not on home oxygen.  Past Medical History:  Diagnosis Date  . Anxiety   . Arthritis   . Atrial fibrillation ([place])   . COPD (chronic obstructive pulmonary disease) ([place])   . Dysrhythmia    AF  . Essential hypertension, benign   . Gallstones   . GERD (gastroesophageal reflux disease)   . H/O hiatal hernia   . History of nuclear stress test 10/2010   dipyridamole; normal pattern of perfusion; low risk, normal study  . 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 Mild aortic insufficiency   . Osteoporosis   . Peripheral neuropathy   . Pneumonia    states she had it twice, last time a couple of months ago  . PONV (postoperative nausea and vomiting)   . Shortness of breath    with exertion  . Unspecified hypothyroidism   . Venous insufficiency     Patient Active Problem List   Diagnosis Date Noted  . Skin avulsion 09/19/2016  . Influenza-like illness 04/16/2016  . Sepsis ([place]) 04/16/2016  . Hypokalemia 04/16/2016  . Hypothyroidism 04/16/2016  . Post herpetic neuralgia 06/06/2014  . Shingles outbreak 04/19/2014  . Gallstones 09/20/2013  . Community acquired pneumonia 05/17/2013  . COPD (chronic obstructive pulmonary disease) ([place]) 05/17/2013  . Dyspnea on exertion 04/25/2013  . Pulmonary emphysema ([place]) 11/15/2012  . HTN (hypertension) 11/15/2012  . PAF (paroxysmal atrial fibrillation) ([place])  06/14/2012  . Long term current use of anticoagulant therapy 06/14/2012  . Ventral hernia 07/31/2011  . Abdominal wall mass 07/01/2011    Past Surgical History:  Procedure Laterality Date  . APPENDECTOMY  1935  . BACK SURGERY     x2  . Cardiometablic Testing  7/82/9562   submaximal effort with peak RER of 0.5, peak VO2 79% predicted; HR peak up to 78%; PVC was 55% predicted, PEV1 39% predicted; PEV1/VC ratio was reduced; normal vital capacity; DLCO was reduced to 63%  . CARPAL TUNNEL RELEASE    . CATARACT EXTRACTION W/ INTRAOCULAR LENS  IMPLANT, BILATERAL    . CHOLECYSTECTOMY  04/07/2014   dr toth  . CHOLECYSTECTOMY N/A 04/07/2014   Procedure: LAPAROSCOPIC CHOLECYSTECTOMY WITH INTRAOPERATIVE CHOLANGIOGRAM POSSBILE OPEN;  Surgeon: Autumn Messing III, MD;  Location: [facility];  Service: General;  Laterality: N/A;  . COLON SURGERY  2008   partial  . ESOPHAGOGASTRODUODENOSCOPY (EGD) WITH PROPOFOL N/A 05/25/2017   Procedure: ESOPHAGOGASTRODUODENOSCOPY (EGD) WITH PROPOFOL;  Surgeon: Clarene Essex, MD;  Location: [facility];  Service: Endoscopy;  Laterality: N/A;  . HERNIA REPAIR    . JOINT REPLACEMENT    . left hip relacement  2000  . SAVORY DILATION N/A 05/25/2017   Procedure: SAVORY DILATION;  Surgeon: Clarene Essex, MD;  Location: [facility] ENDOSCOPY;  Service: Endoscopy;  Laterality: N/A;  . TONSILLECTOMY  1935  . TOTAL ABDOMINAL HYSTERECTOMY  1970  . TRANSTHORACIC ECHOCARDIOGRAM  05/2011  EF=>55%; mild conc LVH; mild mitral annular calcif; mild TR; AV mildly sclerotic & mild AR  . VENTRAL HERNIA REPAIR  09/19/2011   Procedure: LAPAROSCOPIC VENTRAL HERNIA;  Surgeon: Merrie Roof, MD;  Location: [facility];  Service: General;  Laterality: N/A;  laparoscopic ventral hernia repair with mesh     OB History   No obstetric history on file.      Home Medications    Prior to Admission medications   Medication Sig Start Date End Date Taking? Authorizing Provider  albuterol (PROVENTIL HFA;VENTOLIN HFA) 108  (90 BASE) MCG/ACT inhaler Inhale 2 puffs into the lungs every 6 (six) hours as needed for wheezing or shortness of breath. 05/23/13   Burnard Bunting, MD  budesonide-formoterol [facility]) 160-4.5 MCG/ACT inhaler Inhale 2 puffs into the lungs 2 (two) times daily.     [provider]  calcium citrate-vitamin D (CITRACAL+D) 315-200 MG-UNIT per tablet Take 1 tablet by mouth daily.    [provider]  Carboxymethylcellulose Sodium (THERATEARS OP) Place 2 drops into both eyes 2 (two) times daily.    [provider]  diltiazem (CARDIZEM CD) 180 MG 24 hr capsule Take 180 mg by mouth daily.    [provider]  ELIQUIS 2.5 MG TABS tablet TAKE 1 TABLET BY MOUTH TWICE DAILY Patient taking differently: Take 2.5 mg by mouth 2 (two) times daily.  11/23/17   Hilty, Nadean Corwin, MD  fentaNYL (DURAGESIC - DOSED MCG/HR) 12 MCG/HR Place 1 patch (12.5 mcg total) onto the skin every 3 (three) days. 01/26/18   Gareth Morgan, MD  furosemide (LASIX) 40 MG tablet Take 1 tablet (40 mg total) by mouth as needed. 07/15/17   Almyra Deforest, PA  gabapentin (NEURONTIN) 300 MG capsule Take 1 capsule (300 mg total) by mouth daily. Patient not taking: Reported on 05/14/2017 03/03/17   Almyra Deforest, PA  hydrochlorothiazide (HYDRODIURIL) 25 MG tablet Take 1 tablet (25 mg total) by mouth daily. 03/17/18   Hilty, Nadean Corwin, MD  Lactobacillus Reuteri (BIOGAIA PROBIOTIC PO) Take 1 capsule by mouth daily.    [provider]  levothyroxine (SYNTHROID, LEVOTHROID) 137 MCG tablet Take 137 mcg by mouth daily before breakfast.     [provider]  lidocaine (LIDODERM) 5 % Place 1 patch onto the skin daily. Remove & Discard patch within 12 hours or as directed by MD 01/26/18   Gareth Morgan, MD  losartan (COZAAR) 100 MG tablet TAKE 1 TABLET EVERY DAY  (KEEP  OFFICE  VISIT) Patient taking differently: Take 100 mg by mouth daily.  08/11/17   Hilty, Nadean Corwin, MD  Multiple Vitamins-Minerals (MULTIVITAMIN  WITH MINERALS) tablet Take 1 tablet by mouth daily.    [provider]  polyethylene glycol (MIRALAX / GLYCOLAX) packet Take 17 g by mouth every other day.     [provider]  potassium chloride (KLOR-CON) 8 MEQ tablet Take 1 tablet (8 mEq total) by mouth 2 (two) times daily. Patient not taking: Reported on 05/14/2017 03/03/17   Almyra Deforest, PA  promethazine-codeine [facility] WITH CODEINE) 6.25-10 MG/5ML syrup Take 5 mLs by mouth every 6 (six) hours as needed for cough. Patient not taking: Reported on 05/14/2017 04/19/16   Dessa Phi, DO    Family History Family History  Problem Relation Age of Onset  . Stroke Mother   . Stroke Father   . Heart block Brother   . Bladder Cancer Brother   . Diabetes Brother   . Stroke Brother  Social History Social History   Tobacco Use  . Smoking status: Former Smoker    Packs/day: 1.00    Years: 20.00    Pack years: 20.00    Types: Cigarettes    Last attempt to quit: 09/12/1978    Years since quitting: 39.5  . Smokeless tobacco: Never Used  Substance Use Topics  . Alcohol use: Yes    Alcohol/week: 0.0 standard drinks    Comment: occ  . Drug use: No     Allergies   Ace inhibitors and Sulfur   Review of Systems Review of Systems  All systems reviewed and negative, other than as noted in HPI.  Physical Exam Updated Vital Signs BP 108/65 (BP Location: Right Arm)   Pulse 88   Temp 97.9 F (36.6 C) (Oral)   Resp 16   Ht 5\' 3"  (1.6 m)   Wt 45.4 kg   SpO2 93%   BMI 17.71 kg/m   Physical Exam Vitals signs and nursing note reviewed.  Constitutional:      General: She is not in acute distress.    Appearance: She is well-developed.  HENT:     Head: Normocephalic and atraumatic.  Eyes:     General:        Right eye: No discharge.        Left eye: No discharge.     Conjunctiva/sclera: Conjunctivae normal.  Neck:     Musculoskeletal: Neck supple.  Cardiovascular:     Rate and Rhythm: Normal rate and  regular rhythm.     Heart sounds: Normal heart sounds. No murmur. No friction rub. No gallop.   Pulmonary:     Effort: Pulmonary effort is normal. No respiratory distress.     Breath sounds: Wheezing present.     Comments: Faint expiratory wheezing bilaterally.  No increased work of breathing.  Speaks in complete sentences. Abdominal:     General: There is no distension.     Palpations: Abdomen is soft.     Tenderness: There is no abdominal tenderness.  Musculoskeletal:        General: No tenderness.  Skin:    General: Skin is warm and dry.  Neurological:     Mental Status: She is alert.  Psychiatric:        Behavior: Behavior normal.        Thought Content: Thought content normal.      ED Treatments / Results  Labs (all labs ordered are listed, but only abnormal results are displayed) Labs Reviewed  BASIC METABOLIC PANEL - Abnormal; Notable for the following components:      Result Value   Sodium 133 (*)    Chloride 94 (*)    Glucose, Bld 101 (*)    All other components within normal limits  CBC WITH DIFFERENTIAL/PLATELET  BRAIN NATRIURETIC PEPTIDE    EKG EKG Interpretation  Date/Time:  Wednesday March 24 2018 17:42:17 EST Ventricular Rate:  69 PR Interval:    QRS Duration: 79 QT Interval:  426 QTC Calculation: 457 R Axis:   62 Text Interpretation:  Sinus rhythm Atrial premature complex Anterior infarct, old Confirmed by Virgel Manifold [phone]) on 03/24/2018 5:45:14 PM   Radiology Dg Chest 2 View  Result Date: 03/24/2018 CLINICAL DATA:  Shortness of breath 1 week. EXAM: CHEST - 2 VIEW COMPARISON:  01/25/2018 FINDINGS: Lungs are adequately inflated with stable elevation of the right hemidiaphragm. No focal airspace consolidation or effusion. Evidence of emphysematous disease. Cardiomediastinal silhouette is within normal. Multiple thoracic  spine compression fractures unchanged. IMPRESSION: No acute cardiopulmonary disease. Emphysematous disease. Multiple  thoracic spine compression fractures unchanged. Electronically Signed   By: Marin Olp M.D.   On: 03/24/2018 18:13    Procedures Procedures (including critical care time)  Medications Ordered in ED Medications  ipratropium-albuterol (DUONEB) 0.5-2.5 (3) MG/3ML nebulizer solution 3 mL (3 mLs Nebulization Given 03/24/18 1823)  methylPREDNISolone sodium succinate (SOLU-MEDROL) 125 mg/2 mL injection 80 mg (80 mg Intravenous Given 03/24/18 1825)     Initial Impression / Assessment and Plan / ED Course  I have reviewed the triage vital signs and the nursing notes.  Pertinent labs & imaging results that were available during my care of the patient were reviewed by me and considered in my medical decision making (see chart for details).     84 year old female with what I suspect may be a mild COPD exacerbation.  Feeling better with treatment here in the emergency room.  ED work-up fairly unremarkable. Final Clinical Impressions(s) / ED Diagnoses   Final diagnoses:  COPD exacerbation [facility])    ED Discharge Orders    None       Virgel Manifold, MD 04/02/18 [phone]

## 2018-03-24 NOTE — ED Notes (Signed)
Reviewed d/c instructions with pt, who verbalized understanding and had no outstanding questions. Pt departed in NAD.   

## 2018-03-25 ENCOUNTER — Telehealth: Payer: Self-pay | Admitting: Internal Medicine

## 2018-03-25 NOTE — Telephone Encounter (Signed)
New Message   Sonal from [facility] at part of [address] is calling to advise that they have attempted to reach patient 3 times to assist with the patient assistance program with no success. At this time they will be closing out the case.

## 2018-03-26 ENCOUNTER — Telehealth: Payer: Self-pay | Admitting: Internal Medicine

## 2018-03-26 DIAGNOSIS — J441 Chronic obstructive pulmonary disease with (acute) exacerbation: Secondary | ICD-10-CM | POA: Diagnosis not present

## 2018-03-26 DIAGNOSIS — Z681 Body mass index (BMI) 19 or less, adult: Secondary | ICD-10-CM | POA: Diagnosis not present

## 2018-03-26 NOTE — Telephone Encounter (Signed)
Spoke with [employer]. Date was left off application. Will re-fax

## 2018-03-26 NOTE — Telephone Encounter (Signed)
Faxed patient assistance application to [employer] for Eliquis for year 2020. Fax: [phone]

## 2018-03-26 NOTE — Telephone Encounter (Signed)
Spoke with rep at [facility] to notify them that patient WILL NEED shipment of eliquis 2.5mg . I was able to provide med list, allergy list, and confirm address for them so that patient can be shipped medication on Monday 03/29/18.

## 2018-03-26 NOTE — Telephone Encounter (Signed)
New Message   [name] is calling about an application they received for pts Eliquis  Please call.

## 2018-03-30 DIAGNOSIS — J449 Chronic obstructive pulmonary disease, unspecified: Secondary | ICD-10-CM | POA: Diagnosis not present

## 2018-03-30 DIAGNOSIS — J441 Chronic obstructive pulmonary disease with (acute) exacerbation: Secondary | ICD-10-CM | POA: Diagnosis not present

## 2018-03-30 NOTE — Telephone Encounter (Signed)
Faxed patient assistance application to [employer]

## 2018-04-08 DIAGNOSIS — R82998 Other abnormal findings in urine: Secondary | ICD-10-CM | POA: Diagnosis not present

## 2018-04-08 DIAGNOSIS — I10 Essential (primary) hypertension: Secondary | ICD-10-CM | POA: Diagnosis not present

## 2018-04-08 DIAGNOSIS — E038 Other specified hypothyroidism: Secondary | ICD-10-CM | POA: Diagnosis not present

## 2018-04-08 DIAGNOSIS — M810 Age-related osteoporosis without current pathological fracture: Secondary | ICD-10-CM | POA: Diagnosis not present

## 2018-04-08 DIAGNOSIS — R7309 Other abnormal glucose: Secondary | ICD-10-CM | POA: Diagnosis not present

## 2018-04-15 DIAGNOSIS — E038 Other specified hypothyroidism: Secondary | ICD-10-CM | POA: Diagnosis not present

## 2018-04-15 DIAGNOSIS — I480 Paroxysmal atrial fibrillation: Secondary | ICD-10-CM | POA: Diagnosis not present

## 2018-04-15 DIAGNOSIS — R7301 Impaired fasting glucose: Secondary | ICD-10-CM | POA: Diagnosis not present

## 2018-04-15 DIAGNOSIS — M5489 Other dorsalgia: Secondary | ICD-10-CM | POA: Diagnosis not present

## 2018-04-15 DIAGNOSIS — I10 Essential (primary) hypertension: Secondary | ICD-10-CM | POA: Diagnosis not present

## 2018-04-15 DIAGNOSIS — Z23 Encounter for immunization: Secondary | ICD-10-CM | POA: Diagnosis not present

## 2018-04-15 DIAGNOSIS — G629 Polyneuropathy, unspecified: Secondary | ICD-10-CM | POA: Diagnosis not present

## 2018-04-15 DIAGNOSIS — Z0000 Encounter for general adult medical examination without abnormal findings: Secondary | ICD-10-CM | POA: Diagnosis not present

## 2018-04-15 DIAGNOSIS — M5416 Radiculopathy, lumbar region: Secondary | ICD-10-CM | POA: Diagnosis not present

## 2018-04-15 DIAGNOSIS — M810 Age-related osteoporosis without current pathological fracture: Secondary | ICD-10-CM | POA: Diagnosis not present

## 2018-04-24 ENCOUNTER — Emergency Department (HOSPITAL_COMMUNITY): Payer: Medicare HMO

## 2018-04-24 ENCOUNTER — Encounter (HOSPITAL_COMMUNITY): Payer: Self-pay | Admitting: *Deleted

## 2018-04-24 ENCOUNTER — Other Ambulatory Visit: Payer: Self-pay

## 2018-04-24 ENCOUNTER — Inpatient Hospital Stay (HOSPITAL_COMMUNITY)
Admission: EM | Admit: 2018-04-24 | Discharge: 2018-04-27 | DRG: 193 | Disposition: A | Discharge status: Home Health | Payer: Medicare HMO | Attending: Family Medicine | Admitting: Family Medicine

## 2018-04-24 DIAGNOSIS — J9621 Acute and chronic respiratory failure with hypoxia: Secondary | ICD-10-CM | POA: Diagnosis not present

## 2018-04-24 DIAGNOSIS — J101 Influenza due to other identified influenza virus with other respiratory manifestations: Secondary | ICD-10-CM | POA: Diagnosis not present

## 2018-04-24 DIAGNOSIS — I10 Essential (primary) hypertension: Secondary | ICD-10-CM | POA: Diagnosis not present

## 2018-04-24 DIAGNOSIS — Z7951 Long term (current) use of inhaled steroids: Secondary | ICD-10-CM | POA: Diagnosis not present

## 2018-04-24 DIAGNOSIS — I110 Hypertensive heart disease with heart failure: Secondary | ICD-10-CM | POA: Diagnosis present

## 2018-04-24 DIAGNOSIS — Z888 Allergy status to other drugs, medicaments and biological substances status: Secondary | ICD-10-CM

## 2018-04-24 DIAGNOSIS — Z681 Body mass index (BMI) 19 or less, adult: Secondary | ICD-10-CM | POA: Diagnosis not present

## 2018-04-24 DIAGNOSIS — I480 Paroxysmal atrial fibrillation: Secondary | ICD-10-CM | POA: Diagnosis not present

## 2018-04-24 DIAGNOSIS — G629 Polyneuropathy, unspecified: Secondary | ICD-10-CM | POA: Diagnosis present

## 2018-04-24 DIAGNOSIS — B9789 Other viral agents as the cause of diseases classified elsewhere: Secondary | ICD-10-CM

## 2018-04-24 DIAGNOSIS — Z961 Presence of intraocular lens: Secondary | ICD-10-CM | POA: Diagnosis present

## 2018-04-24 DIAGNOSIS — J069 Acute upper respiratory infection, unspecified: Secondary | ICD-10-CM

## 2018-04-24 DIAGNOSIS — R0902 Hypoxemia: Secondary | ICD-10-CM

## 2018-04-24 DIAGNOSIS — K219 Gastro-esophageal reflux disease without esophagitis: Secondary | ICD-10-CM | POA: Diagnosis present

## 2018-04-24 DIAGNOSIS — Z7901 Long term (current) use of anticoagulants: Secondary | ICD-10-CM

## 2018-04-24 DIAGNOSIS — J441 Chronic obstructive pulmonary disease with (acute) exacerbation: Secondary | ICD-10-CM | POA: Diagnosis present

## 2018-04-24 DIAGNOSIS — J449 Chronic obstructive pulmonary disease, unspecified: Secondary | ICD-10-CM | POA: Diagnosis present

## 2018-04-24 DIAGNOSIS — Z87891 Personal history of nicotine dependence: Secondary | ICD-10-CM

## 2018-04-24 DIAGNOSIS — Z7952 Long term (current) use of systemic steroids: Secondary | ICD-10-CM

## 2018-04-24 DIAGNOSIS — M1990 Unspecified osteoarthritis, unspecified site: Secondary | ICD-10-CM | POA: Diagnosis not present

## 2018-04-24 DIAGNOSIS — Z9842 Cataract extraction status, left eye: Secondary | ICD-10-CM

## 2018-04-24 DIAGNOSIS — M810 Age-related osteoporosis without current pathological fracture: Secondary | ICD-10-CM | POA: Diagnosis present

## 2018-04-24 DIAGNOSIS — E039 Hypothyroidism, unspecified: Secondary | ICD-10-CM | POA: Diagnosis present

## 2018-04-24 DIAGNOSIS — Z79899 Other long term (current) drug therapy: Secondary | ICD-10-CM | POA: Diagnosis not present

## 2018-04-24 DIAGNOSIS — J9601 Acute respiratory failure with hypoxia: Secondary | ICD-10-CM | POA: Diagnosis not present

## 2018-04-24 DIAGNOSIS — E43 Unspecified severe protein-calorie malnutrition: Secondary | ICD-10-CM | POA: Diagnosis not present

## 2018-04-24 DIAGNOSIS — Z9841 Cataract extraction status, right eye: Secondary | ICD-10-CM | POA: Diagnosis not present

## 2018-04-24 DIAGNOSIS — I5032 Chronic diastolic (congestive) heart failure: Secondary | ICD-10-CM | POA: Diagnosis not present

## 2018-04-24 DIAGNOSIS — R140 Abdominal distension (gaseous): Secondary | ICD-10-CM | POA: Diagnosis not present

## 2018-04-24 DIAGNOSIS — R0602 Shortness of breath: Secondary | ICD-10-CM | POA: Diagnosis not present

## 2018-04-24 DIAGNOSIS — Z96642 Presence of left artificial hip joint: Secondary | ICD-10-CM | POA: Diagnosis present

## 2018-04-24 DIAGNOSIS — F419 Anxiety disorder, unspecified: Secondary | ICD-10-CM | POA: Diagnosis not present

## 2018-04-24 DIAGNOSIS — R69 Illness, unspecified: Secondary | ICD-10-CM | POA: Diagnosis not present

## 2018-04-24 DIAGNOSIS — R05 Cough: Secondary | ICD-10-CM | POA: Diagnosis not present

## 2018-04-24 DIAGNOSIS — I4891 Unspecified atrial fibrillation: Secondary | ICD-10-CM | POA: Diagnosis present

## 2018-04-24 DIAGNOSIS — Z882 Allergy status to sulfonamides status: Secondary | ICD-10-CM

## 2018-04-24 LAB — CBC WITH DIFFERENTIAL/PLATELET
Abs Immature Granulocytes: 0.05 10*3/uL (ref 0.00–0.07)
BASOS PCT: 0 %
Basophils Absolute: 0 10*3/uL (ref 0.0–0.1)
Eosinophils Absolute: 0.2 10*3/uL (ref 0.0–0.5)
Eosinophils Relative: 2 %
HCT: 39.7 % (ref 36.0–46.0)
HEMOGLOBIN: 13.2 g/dL (ref 12.0–15.0)
Immature Granulocytes: 1 %
Lymphocytes Relative: 13 %
Lymphs Abs: 1 10*3/uL (ref 0.7–4.0)
MCH: 31.5 pg (ref 26.0–34.0)
MCHC: 33.2 g/dL (ref 30.0–36.0)
MCV: 94.7 fL (ref 80.0–100.0)
Monocytes Absolute: 0.4 10*3/uL (ref 0.1–1.0)
Monocytes Relative: 5 %
NEUTROS ABS: 5.9 10*3/uL (ref 1.7–7.7)
Neutrophils Relative %: 79 %
Platelets: 291 10*3/uL (ref 150–400)
RBC: 4.19 MIL/uL (ref 3.87–5.11)
RDW: 14.6 % (ref 11.5–15.5)
WBC: 7.5 10*3/uL (ref 4.0–10.5)
nRBC: 0 % (ref 0.0–0.2)

## 2018-04-24 LAB — COMPREHENSIVE METABOLIC PANEL
ALT: 24 U/L (ref 0–44)
AST: 28 U/L (ref 15–41)
Albumin: 3.9 g/dL (ref 3.5–5.0)
Alkaline Phosphatase: 77 U/L (ref 38–126)
Anion gap: 12 (ref 5–15)
BUN: 23 mg/dL (ref 8–23)
CO2: 28 mmol/L (ref 22–32)
Calcium: 9.5 mg/dL (ref 8.9–10.3)
Chloride: 99 mmol/L (ref 98–111)
Creatinine, Ser: 0.73 mg/dL (ref 0.44–1.00)
GFR calc Af Amer: >60 mL/min (ref >60)
Glucose, Bld: 130 mg/dL — ABNORMAL HIGH (ref 70–99)
Potassium: 3.1 mmol/L — ABNORMAL LOW (ref 3.5–5.1)
Sodium: 139 mmol/L (ref 135–145)
TOTAL PROTEIN: 6.5 g/dL (ref 6.5–8.1)
Total Bilirubin: 0.9 mg/dL (ref 0.3–1.2)

## 2018-04-24 MED ORDER — LEVOTHYROXINE SODIUM 137 MCG PO TABS
137.0000 ug | ORAL_TABLET | Freq: Every day | ORAL | Status: DC
  Administered 2018-04-25 – 2018-04-27 (×3): 137 ug via ORAL
  Filled 2018-04-24 (×3): qty 1

## 2018-04-24 MED ORDER — ALBUTEROL SULFATE (2.5 MG/3ML) 0.083% IN NEBU: 2.5000 mg | INHALATION_SOLUTION | RESPIRATORY_TRACT | Status: DC | PRN

## 2018-04-24 MED ORDER — APIXABAN 2.5 MG PO TABS
2.5000 mg | ORAL_TABLET | Freq: Two times a day (BID) | ORAL | Status: DC
  Administered 2018-04-25 – 2018-04-27 (×6): 2.5 mg via ORAL
  Filled 2018-04-24 (×6): qty 1

## 2018-04-24 MED ORDER — DILTIAZEM HCL ER COATED BEADS 180 MG PO CP24
180.0000 mg | ORAL_CAPSULE | Freq: Every day | ORAL | Status: DC
  Administered 2018-04-25 – 2018-04-27 (×3): 180 mg via ORAL
  Filled 2018-04-24 (×3): qty 1

## 2018-04-24 MED ORDER — ADULT MULTIVITAMIN W/MINERALS CH
1.0000 | ORAL_TABLET | Freq: Every day | ORAL | Status: DC
  Administered 2018-04-25 – 2018-04-27 (×3): 1 via ORAL
  Filled 2018-04-24 (×3): qty 1

## 2018-04-24 MED ORDER — TERIPARATIDE (RECOMBINANT) 600 MCG/2.4ML ~~LOC~~ SOLN
Freq: Every day | SUBCUTANEOUS | Status: DC
  Administered 2018-04-26: 16:00:00 via SUBCUTANEOUS
  Administered 2018-04-27: 250 ug via SUBCUTANEOUS
  Filled 2018-04-24 (×2): qty 2.4

## 2018-04-24 MED ORDER — ACETAMINOPHEN 325 MG PO TABS: 650.0000 mg | ORAL_TABLET | Freq: Four times a day (QID) | ORAL | Status: DC | PRN

## 2018-04-24 MED ORDER — ALBUTEROL SULFATE (2.5 MG/3ML) 0.083% IN NEBU
5.0000 mg | INHALATION_SOLUTION | Freq: Once | RESPIRATORY_TRACT | Status: AC
  Administered 2018-04-24: 5 mg via RESPIRATORY_TRACT
  Filled 2018-04-24: qty 6

## 2018-04-24 MED ORDER — POTASSIUM CHLORIDE CRYS ER 20 MEQ PO TBCR
40.0000 meq | EXTENDED_RELEASE_TABLET | Freq: Once | ORAL | Status: AC
  Administered 2018-04-24: 40 meq via ORAL
  Filled 2018-04-24: qty 2

## 2018-04-24 MED ORDER — POLYVINYL ALCOHOL 1.4 % OP SOLN
Freq: Two times a day (BID) | OPHTHALMIC | Status: DC
  Administered 2018-04-25 – 2018-04-27 (×5): via OPHTHALMIC
  Filled 2018-04-24: qty 15

## 2018-04-24 MED ORDER — MOMETASONE FURO-FORMOTEROL FUM 200-5 MCG/ACT IN AERO
1.0000 | INHALATION_SPRAY | Freq: Two times a day (BID) | RESPIRATORY_TRACT | Status: DC
  Administered 2018-04-25 – 2018-04-27 (×5): 1 via RESPIRATORY_TRACT
  Filled 2018-04-24 (×2): qty 8.8

## 2018-04-24 MED ORDER — SODIUM CHLORIDE 0.9% FLUSH
3.0000 mL | Freq: Once | INTRAVENOUS | Status: AC
  Administered 2018-04-24: 3 mL via INTRAVENOUS

## 2018-04-24 MED ORDER — IPRATROPIUM BROMIDE 0.02 % IN SOLN
0.5000 mg | Freq: Once | RESPIRATORY_TRACT | Status: AC
  Administered 2018-04-24: 0.5 mg via RESPIRATORY_TRACT
  Filled 2018-04-24: qty 2.5

## 2018-04-24 MED ORDER — ACETAMINOPHEN 325 MG PO TABS
650.0000 mg | ORAL_TABLET | Freq: Once | ORAL | Status: AC
  Administered 2018-04-24: 650 mg via ORAL
  Filled 2018-04-24: qty 2

## 2018-04-24 MED ORDER — OSELTAMIVIR PHOSPHATE 30 MG PO CAPS
30.0000 mg | ORAL_CAPSULE | Freq: Two times a day (BID) | ORAL | Status: DC
  Administered 2018-04-25 – 2018-04-27 (×6): 30 mg via ORAL
  Filled 2018-04-24 (×6): qty 1

## 2018-04-24 MED ORDER — IPRATROPIUM BROMIDE 0.02 % IN SOLN
0.5000 mg | Freq: Four times a day (QID) | RESPIRATORY_TRACT | Status: DC
  Administered 2018-04-25 (×4): 0.5 mg via RESPIRATORY_TRACT
  Filled 2018-04-24 (×4): qty 2.5

## 2018-04-24 MED ORDER — SODIUM CHLORIDE 0.9 % IV BOLUS
500.0000 mL | Freq: Once | INTRAVENOUS | Status: AC
  Administered 2018-04-24: 500 mL via INTRAVENOUS

## 2018-04-24 MED ORDER — METHYLPREDNISOLONE SODIUM SUCC 125 MG IJ SOLR
80.0000 mg | Freq: Once | INTRAMUSCULAR | Status: AC
  Administered 2018-04-24: 80 mg via INTRAVENOUS
  Filled 2018-04-24: qty 2

## 2018-04-24 MED ORDER — PREDNISONE 20 MG PO TABS
40.0000 mg | ORAL_TABLET | Freq: Every day | ORAL | Status: DC
  Administered 2018-04-25 – 2018-04-26 (×2): 40 mg via ORAL
  Filled 2018-04-24 (×2): qty 2

## 2018-04-24 MED ORDER — ENOXAPARIN SODIUM 40 MG/0.4ML ~~LOC~~ SOLN: 40.0000 mg | SUBCUTANEOUS | Status: DC

## 2018-04-24 MED ORDER — CALCIUM CARBONATE-VITAMIN D 500-200 MG-UNIT PO TABS
1.0000 | ORAL_TABLET | Freq: Every day | ORAL | Status: DC
  Administered 2018-04-25 – 2018-04-27 (×3): 1 via ORAL
  Filled 2018-04-24 (×3): qty 1

## 2018-04-24 MED ORDER — POLYETHYLENE GLYCOL 3350 17 G PO PACK
17.0000 g | PACK | ORAL | Status: DC
  Filled 2018-04-24 (×2): qty 1

## 2018-04-24 NOTE — ED Provider Notes (Signed)
[facility] EMERGENCY DEPARTMENT Provider Note   CSN: [id number] Arrival date & time: 04/24/18  1935     History   Chief Complaint Chief Complaint  Patient presents with  . Shortness of Breath    HPI Heather [name] is a 85 y.o. female.  Patient c/o onset non prood cough, chills, congestion, sore throat, in the past 1 day. Symptoms acute onset, moderate, persistent. No specific known ill contacts. Denies chest pain or discomfort. No abd pain. No nvd. No dysuria or gu c/o.   The history is provided by the patient.  Shortness of Breath  Associated symptoms: cough and sore throat   Associated symptoms: no abdominal pain, no chest pain, no fever, no headaches, no neck pain, no rash and no vomiting     Past Medical History:  Diagnosis Date  . Anxiety   . Arthritis   . Atrial fibrillation ([place])   . COPD (chronic obstructive pulmonary disease) ([place])   . Dysrhythmia    AF  . Essential hypertension, benign   . Gallstones   . GERD (gastroesophageal reflux disease)   . H/O hiatal hernia   . History of nuclear stress test 10/2010   dipyridamole; normal pattern of perfusion; low risk, normal study  . 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 Mild aortic insufficiency   . Osteoporosis   . Peripheral neuropathy   . Pneumonia    states she had it twice, last time a couple of months ago  . PONV (postoperative nausea and vomiting)   . Shortness of breath    with exertion  . Unspecified hypothyroidism   . Venous insufficiency     Patient Active Problem List   Diagnosis Date Noted  . Skin avulsion 09/19/2016  . Influenza-like illness 04/16/2016  . Sepsis ([place]) 04/16/2016  . Hypokalemia 04/16/2016  . Hypothyroidism 04/16/2016  . Post herpetic neuralgia 06/06/2014  . Shingles outbreak 04/19/2014  . Gallstones 09/20/2013  . Community acquired pneumonia 05/17/2013  . COPD (chronic obstructive pulmonary disease) ([place]) 05/17/2013   . Dyspnea on exertion 04/25/2013  . Pulmonary emphysema ([place]) 11/15/2012  . HTN (hypertension) 11/15/2012  . PAF (paroxysmal atrial fibrillation) ([place]) 06/14/2012  . Long term current use of anticoagulant therapy 06/14/2012  . Ventral hernia 07/31/2011  . Abdominal wall mass 07/01/2011    Past Surgical History:  Procedure Laterality Date  . APPENDECTOMY  1935  . BACK SURGERY     x2  . Cardiometablic Testing  4/40/1027   submaximal effort with peak RER of 0.5, peak VO2 79% predicted; HR peak up to 78%; PVC was 55% predicted, PEV1 39% predicted; PEV1/VC ratio was reduced; normal vital capacity; DLCO was reduced to 63%  . CARPAL TUNNEL RELEASE    . CATARACT EXTRACTION W/ INTRAOCULAR LENS  IMPLANT, BILATERAL    . CHOLECYSTECTOMY  04/07/2014   dr toth  . CHOLECYSTECTOMY N/A 04/07/2014   Procedure: LAPAROSCOPIC CHOLECYSTECTOMY WITH INTRAOPERATIVE CHOLANGIOGRAM POSSBILE OPEN;  Surgeon: Autumn Messing III, MD;  Location: [facility];  Service: General;  Laterality: N/A;  . COLON SURGERY  2008   partial  . ESOPHAGOGASTRODUODENOSCOPY (EGD) WITH PROPOFOL N/A 05/25/2017   Procedure: ESOPHAGOGASTRODUODENOSCOPY (EGD) WITH PROPOFOL;  Surgeon: Clarene Essex, MD;  Location: [facility];  Service: Endoscopy;  Laterality: N/A;  . HERNIA REPAIR    . JOINT REPLACEMENT    . left hip relacement  2000  . SAVORY DILATION N/A 05/25/2017   Procedure: SAVORY DILATION;  Surgeon: Clarene Essex, MD;  Location:  [facility];  Service: Endoscopy;  Laterality: N/A;  . TONSILLECTOMY  1935  . TOTAL ABDOMINAL HYSTERECTOMY  1970  . TRANSTHORACIC ECHOCARDIOGRAM  05/2011   EF=>55%; mild conc LVH; mild mitral annular calcif; mild TR; AV mildly sclerotic & mild AR  . VENTRAL HERNIA REPAIR  09/19/2011   Procedure: LAPAROSCOPIC VENTRAL HERNIA;  Surgeon: Merrie Roof, MD;  Location: [facility];  Service: General;  Laterality: N/A;  laparoscopic ventral hernia repair with mesh     OB History   No obstetric history on file.      Home  Medications    Prior to Admission medications   Medication Sig Start Date End Date Taking? Authorizing Provider  albuterol (PROVENTIL HFA;VENTOLIN HFA) 108 (90 BASE) MCG/ACT inhaler Inhale 2 puffs into the lungs every 6 (six) hours as needed for wheezing or shortness of breath. 05/23/13   Burnard Bunting, MD  budesonide-formoterol [facility]) 160-4.5 MCG/ACT inhaler Inhale 2 puffs into the lungs 2 (two) times daily.     [provider]  calcium citrate-vitamin D (CITRACAL+D) 315-200 MG-UNIT per tablet Take 1 tablet by mouth daily.    [provider]  Carboxymethylcellulose Sodium (THERATEARS OP) Place 2 drops into both eyes 2 (two) times daily.    [provider]  diltiazem (CARDIZEM CD) 180 MG 24 hr capsule Take 180 mg by mouth daily.    [provider]  ELIQUIS 2.5 MG TABS tablet TAKE 1 TABLET BY MOUTH TWICE DAILY Patient taking differently: Take 2.5 mg by mouth 2 (two) times daily.  11/23/17   Hilty, Nadean Corwin, MD  fentaNYL (DURAGESIC - DOSED MCG/HR) 12 MCG/HR Place 1 patch (12.5 mcg total) onto the skin every 3 (three) days. 01/26/18   Gareth Morgan, MD  furosemide (LASIX) 40 MG tablet Take 1 tablet (40 mg total) by mouth as needed. 07/15/17   Almyra Deforest, PA  gabapentin (NEURONTIN) 300 MG capsule Take 1 capsule (300 mg total) by mouth daily. Patient not taking: Reported on 05/14/2017 03/03/17   Almyra Deforest, PA  hydrochlorothiazide (HYDRODIURIL) 25 MG tablet Take 1 tablet (25 mg total) by mouth daily. 03/17/18   Hilty, Nadean Corwin, MD  Lactobacillus Reuteri (BIOGAIA PROBIOTIC PO) Take 1 capsule by mouth daily.    [provider]  levothyroxine (SYNTHROID, LEVOTHROID) 137 MCG tablet Take 137 mcg by mouth daily before breakfast.     [provider]  lidocaine (LIDODERM) 5 % Place 1 patch onto the skin daily. Remove & Discard patch within 12 hours or as directed by MD 01/26/18   Gareth Morgan, MD  losartan (COZAAR) 100 MG tablet TAKE 1 TABLET  EVERY DAY  (KEEP  OFFICE  VISIT) Patient taking differently: Take 100 mg by mouth daily.  08/11/17   Hilty, Nadean Corwin, MD  Multiple Vitamins-Minerals (MULTIVITAMIN WITH MINERALS) tablet Take 1 tablet by mouth daily.    [provider]  polyethylene glycol (MIRALAX / GLYCOLAX) packet Take 17 g by mouth every other day.     [provider]  potassium chloride (KLOR-CON) 8 MEQ tablet Take 1 tablet (8 mEq total) by mouth 2 (two) times daily. Patient not taking: Reported on 05/14/2017 03/03/17   Almyra Deforest, PA  predniSONE (DELTASONE) 20 MG tablet Take 2 tablets (40 mg total) by mouth daily. 03/24/18   Virgel Manifold, MD  promethazine-codeine (PHENERGAN WITH CODEINE) 6.25-10 MG/5ML syrup Take 5 mLs by mouth every 6 (six) hours as needed for cough. Patient not taking: Reported on 05/14/2017 04/19/16   Maylene Roes,  Anderson Malta, DO    Family History Family History  Problem Relation Age of Onset  . Stroke Mother   . Stroke Father   . Heart block Brother   . Bladder Cancer Brother   . Diabetes Brother   . Stroke Brother     Social History Social History   Tobacco Use  . Smoking status: Former Smoker    Packs/day: 1.00    Years: 20.00    Pack years: 20.00    Types: Cigarettes    Last attempt to quit: 09/12/1978    Years since quitting: 39.6  . Smokeless tobacco: Never Used  Substance Use Topics  . Alcohol use: Yes    Alcohol/week: 0.0 standard drinks    Comment: occ  . Drug use: No     Allergies   Ace inhibitors and Sulfur   Review of Systems Review of Systems  Constitutional: Positive for chills. Negative for fever.  HENT: Positive for congestion, rhinorrhea and sore throat.   Eyes: Negative for discharge and redness.  Respiratory: Positive for cough and shortness of breath.   Cardiovascular: Negative for chest pain and leg swelling.  Gastrointestinal: Negative for abdominal pain, diarrhea and vomiting.  Genitourinary: Negative for dysuria and flank pain.    Musculoskeletal: Positive for myalgias. Negative for neck pain and neck stiffness.  Skin: Negative for rash.  Neurological: Negative for headaches.  Hematological: Does not bruise/bleed easily.  Psychiatric/Behavioral: Negative for confusion.     Physical Exam Updated Vital Signs BP 134/61 (BP Location: Left Arm)   Pulse (!) 116   Temp 98.6 F (37 C) (Oral)   Resp (!) 24   SpO2 99%   Physical Exam Vitals signs and nursing note reviewed.  Constitutional:      Appearance: Normal appearance. She is well-developed.  HENT:     Head: Atraumatic.     Nose: Congestion present.     Mouth/Throat:     Mouth: Mucous membranes are moist.     Pharynx: Oropharynx is clear. No oropharyngeal exudate.  Eyes:     General: No scleral icterus.    Conjunctiva/sclera: Conjunctivae normal.     Pupils: Pupils are equal, round, and reactive to light.  Neck:     Musculoskeletal: Normal range of motion and neck supple. No neck rigidity or muscular tenderness.     Trachea: No tracheal deviation.  Cardiovascular:     Rate and Rhythm: Normal rate and regular rhythm.     Pulses: Normal pulses.     Heart sounds: Normal heart sounds. No murmur. No friction rub. No gallop.   Pulmonary:     Effort: Pulmonary effort is normal. No respiratory distress.     Breath sounds: Wheezing present.     Comments: Mild wheezing.  Abdominal:     General: Bowel sounds are normal. There is no distension.     Palpations: Abdomen is soft.     Tenderness: There is no abdominal tenderness. There is no guarding.  Genitourinary:    Comments: No cva tenderness.  Musculoskeletal:        General: No swelling.  Lymphadenopathy:     Cervical: No cervical adenopathy.  Skin:    General: Skin is warm and dry.     Findings: No rash.  Neurological:     Mental Status: She is alert.     Comments: Alert, speech normal.   Psychiatric:        Mood and Affect: Mood normal.      ED Treatments / Results  Labs (all labs  ordered are listed, but only abnormal results are displayed) Results for orders placed or performed during the hospital encounter of 04/24/18  Comprehensive metabolic panel  Result Value Ref Range   Sodium 139 135 - 145 mmol/L   Potassium 3.1 (L) 3.5 - 5.1 mmol/L   Chloride 99 98 - 111 mmol/L   CO2 28 22 - 32 mmol/L   Glucose, Bld 130 (H) 70 - 99 mg/dL   BUN 23 8 - 23 mg/dL   Creatinine, Ser 0.73 0.44 - 1.00 mg/dL   Calcium 9.5 8.9 - 10.3 mg/dL   Total Protein 6.5 6.5 - 8.1 g/dL   Albumin 3.9 3.5 - 5.0 g/dL   AST 28 15 - 41 U/L   ALT 24 0 - 44 U/L   Alkaline Phosphatase 77 38 - 126 U/L   Total Bilirubin 0.9 0.3 - 1.2 mg/dL   GFR calc non Af Amer >60 >60 mL/min   GFR calc Af Amer >60 >60 mL/min   Anion gap 12 5 - 15  CBC with Differential  Result Value Ref Range   WBC 7.5 4.0 - 10.5 K/uL   RBC 4.19 3.87 - 5.11 MIL/uL   Hemoglobin 13.2 12.0 - 15.0 g/dL   HCT 39.7 36.0 - 46.0 %   MCV 94.7 80.0 - 100.0 fL   MCH 31.5 26.0 - 34.0 pg   MCHC 33.2 30.0 - 36.0 g/dL   RDW 14.6 11.5 - 15.5 %   Platelets 291 150 - 400 K/uL   nRBC 0.0 0.0 - 0.2 %   Neutrophils Relative % 79 %   Neutro Abs 5.9 1.7 - 7.7 K/uL   Lymphocytes Relative 13 %   Lymphs Abs 1.0 0.7 - 4.0 K/uL   Monocytes Relative 5 %   Monocytes Absolute 0.4 0.1 - 1.0 K/uL   Eosinophils Relative 2 %   Eosinophils Absolute 0.2 0.0 - 0.5 K/uL   Basophils Relative 0 %   Basophils Absolute 0.0 0.0 - 0.1 K/uL   Immature Granulocytes 1 %   Abs Immature Granulocytes 0.05 0.00 - 0.07 K/uL   Dg Chest 2 View  Result Date: 04/24/2018 CLINICAL DATA:  Cough and shortness of breath. EXAM: CHEST - 2 VIEW COMPARISON:  03/24/2018, additional prior FINDINGS: Chronic posterior elevation right hemidiaphragm. Chronic hyperinflation and emphysema. Unchanged heart size and mediastinal contours. Aortic atherosclerosis. No acute airspace disease, pleural effusion or pneumothorax. Multiple compression fractures in the thoracic spine, as seen on prior.  No acute osseous abnormalities. IMPRESSION: Chronic hyperinflation and emphysema. No acute abnormality. Aortic Atherosclerosis (ICD10-I70.0) and Emphysema (ICD10-J43.9). Electronically Signed   By: Keith Rake M.D.   On: 04/24/2018 20:32    EKG None  Radiology Dg Chest 2 View  Result Date: 04/24/2018 CLINICAL DATA:  Cough and shortness of breath. EXAM: CHEST - 2 VIEW COMPARISON:  03/24/2018, additional prior FINDINGS: Chronic posterior elevation right hemidiaphragm. Chronic hyperinflation and emphysema. Unchanged heart size and mediastinal contours. Aortic atherosclerosis. No acute airspace disease, pleural effusion or pneumothorax. Multiple compression fractures in the thoracic spine, as seen on prior. No acute osseous abnormalities. IMPRESSION: Chronic hyperinflation and emphysema. No acute abnormality. Aortic Atherosclerosis (ICD10-I70.0) and Emphysema (ICD10-J43.9). Electronically Signed   By: Keith Rake M.D.   On: 04/24/2018 20:32    Procedures Procedures (including critical care time)  Medications Ordered in ED Medications  sodium chloride flush (NS) 0.9 % injection 3 mL (has no administration in time range)     Initial Impression / Assessment  and Plan / ED Course  I have reviewed the triage vital signs and the nursing notes.  Pertinent labs & imaging results that were available during my care of the patient were reviewed by me and considered in my medical decision making (see chart for details).  Cxr. Labs.   Reviewed nursing notes and prior charts for additional history.   Albuterol and atrovent neb.   Labs reviewed - chem normal except k sl low. kcl po.    cxr reviewed - no pna.   Recheck wheezing improved, but wheezing/dyspnea persist - room air pulse ox 87%. 2 liters -  sats 94%.   Additional nebs. Solumedrol. Flu test added to labs.   Acetaminophen for fever. Po fluids.   Given persistent hypoxia on room air, wheezing - will admit. As no pna on cxr,  and constellation symptoms (cough, congestion, sore throat, body aches) ? Whether viral/flu exacerbating copd.   hospitalists consulted for admission.      Final Clinical Impressions(s) / ED Diagnoses   Final diagnoses:  None    ED Discharge Orders    None       Lajean Saver, MD 04/24/18 2304

## 2018-04-24 NOTE — ED Triage Notes (Signed)
Pt reports she has had chills, cough, sob, nausea, and throat pain, started today. Daughter reports zofran and neb treatment 1 hour ago, denies fevers.  Low saturations at nurse first, applied 3 liters [place]

## 2018-04-24 NOTE — ED Triage Notes (Signed)
Pt reports flu like symptoms for 2 days, worse today, pt 89% on room air, oxygen applied (3L) and increased to 94%

## 2018-04-25 ENCOUNTER — Other Ambulatory Visit: Payer: Self-pay

## 2018-04-25 DIAGNOSIS — J101 Influenza due to other identified influenza virus with other respiratory manifestations: Principal | ICD-10-CM

## 2018-04-25 DIAGNOSIS — J441 Chronic obstructive pulmonary disease with (acute) exacerbation: Secondary | ICD-10-CM

## 2018-04-25 DIAGNOSIS — J069 Acute upper respiratory infection, unspecified: Secondary | ICD-10-CM

## 2018-04-25 DIAGNOSIS — I480 Paroxysmal atrial fibrillation: Secondary | ICD-10-CM

## 2018-04-25 DIAGNOSIS — I10 Essential (primary) hypertension: Secondary | ICD-10-CM

## 2018-04-25 DIAGNOSIS — R69 Illness, unspecified: Secondary | ICD-10-CM

## 2018-04-25 DIAGNOSIS — B9789 Other viral agents as the cause of diseases classified elsewhere: Secondary | ICD-10-CM

## 2018-04-25 DIAGNOSIS — R0902 Hypoxemia: Secondary | ICD-10-CM

## 2018-04-25 LAB — URINALYSIS, ROUTINE W REFLEX MICROSCOPIC
Bilirubin Urine: NEGATIVE
Glucose, UA: NEGATIVE mg/dL
Hgb urine dipstick: NEGATIVE
Ketones, ur: NEGATIVE mg/dL
LEUKOCYTES UA: NEGATIVE
NITRITE: NEGATIVE
Protein, ur: NEGATIVE mg/dL
Specific Gravity, Urine: 1.014 (ref 1.005–1.030)
pH: 5 (ref 5.0–8.0)

## 2018-04-25 LAB — HIV ANTIBODY (ROUTINE TESTING W REFLEX): HIV Screen 4th Generation wRfx: NONREACTIVE

## 2018-04-25 LAB — INFLUENZA PANEL BY PCR (TYPE A & B)
Influenza A By PCR: NEGATIVE
Influenza B By PCR: POSITIVE — AB

## 2018-04-25 MED ORDER — SODIUM CHLORIDE 0.9 % IV SOLN
INTRAVENOUS | Status: DC
  Administered 2018-04-25 – 2018-04-26 (×2): via INTRAVENOUS

## 2018-04-25 NOTE — Progress Notes (Signed)
Assisted pt in ordering breakfast

## 2018-04-25 NOTE — Evaluation (Addendum)
Physical Therapy Evaluation Patient Details Name: Heather [name] MRN: [id number] DOB: Jun 13, 1926 Today's Date: 04/25/2018   History of Present Illness  Heather [name] is a 85 y.o. female with medical history significant of COPD, A.Fib on Eliquis, HTN. Patient presents to the ED with c/o cough, SOB, sore throat, fever, headache, body and muscle aches   Clinical Impression  Pt admitted with above diagnosis. Pt currently with functional limitations due to the deficits listed below (see PT Problem List). PTA, pt living at home with husband and children, independent with mobility and ADLs. Today patient min guard for OOB mobility, will progress to HHPT. VSS on 2L   Pt will benefit from skilled PT to increase their independence and safety with mobility to allow discharge to the venue listed below.       Follow Up Recommendations Home health PT    Equipment Recommendations  None recommended by PT    Recommendations for Other Services       Precautions / Restrictions Precautions Precautions: Fall Restrictions Weight Bearing Restrictions: No      Mobility  Bed Mobility Overal bed mobility: Modified Independent                Transfers Overall transfer level: Modified independent Equipment used: None                Ambulation/Gait Ambulation/Gait assistance: Min guard Gait Distance (Feet): 15 Feet Assistive device: None Gait Pattern/deviations: Step-to pattern;Step-through pattern Gait velocity: decreaseed   General Gait Details: Pt with mild unsteadiness holding on to rail of bed walking around it. VSS. no overt LOB. will progress gait next session.  Stairs            Wheelchair Mobility    Modified Rankin (Stroke Patients Only)       Balance                                             Pertinent Vitals/Pain Pain Assessment: No/denies pain    Home Living Family/patient expects to be discharged to:: Private residence Living  Arrangements: Spouse/significant other;Children Available Help at Discharge: Family Type of Home: House Home Access: Level entry     Home Layout: One [place]: Grab bars - toilet;Grab bars - tub/shower;Walker - 2 wheels;Cane - single point      Prior Function Level of Independence: Independent with assistive device(s)         Comments: SPC for household ambulation sometimes. I with ADLs      Hand Dominance        Extremity/Trunk Assessment   Upper Extremity Assessment Upper Extremity Assessment: Overall WFL for tasks assessed    Lower Extremity Assessment Lower Extremity Assessment: Overall WFL for tasks assessed       Communication   Communication: No difficulties  Cognition Arousal/Alertness: Awake/alert Behavior During Therapy: WFL for tasks assessed/performed Overall Cognitive Status: Within Functional Limits for tasks assessed                                        General Comments      Exercises     Assessment/Plan    PT Assessment Patient needs continued PT services  PT Problem List Decreased strength       PT Treatment Interventions DME  instruction;Gait training;Therapeutic activities;Functional mobility training;Therapeutic exercise    PT Goals (Current goals can be found in the Care Plan section)  Acute Rehab PT Goals Patient Stated Goal: go home with  HHPT PT Goal Formulation: With patient Time For Goal Achievement: 05/09/18 Potential to Achieve Goals: Good    Frequency Min 3X/week   Barriers to discharge        Co-evaluation               AM-PAC PT "6 Clicks" Mobility  Outcome Measure Help needed turning from your back to your side while in a flat bed without using bedrails?: None Help needed moving from lying on your back to sitting on the side of a flat bed without using bedrails?: None Help needed moving to and from a bed to a chair (including a wheelchair)?: A Little Help needed standing up  from a chair using your arms (e.g., wheelchair or bedside chair)?: A Little Help needed to walk in hospital room?: A Little Help needed climbing 3-5 steps with a railing? : A Lot 6 Click Score: 19    End of Session Equipment Utilized During Treatment: Gait belt       PT Visit Diagnosis: Unsteadiness on feet (R26.81)    Time: 1110-1135 PT Time Calculation (min) (ACUTE ONLY): 25 min   Charges:   PT Evaluation $PT Eval Low Complexity: 1 Low PT Treatments $Gait Training: 8-22 mins        Reinaldo Berber, PT, DPT Acute Rehabilitation Services Pager: [phone] Office: [phone]    Reinaldo Berber 04/25/2018, 2:33 PM

## 2018-04-25 NOTE — Progress Notes (Signed)
Educated pt on use of incentive spirometer  Pt and family verbalize understanding

## 2018-04-25 NOTE — H&P (Signed)
History and Physical    Heather [name] BJS:[id number] DOB: 1926/06/20 DOA: 04/24/2018  PCP: Burnard Bunting, MD  Patient coming from: Home  I have personally briefly reviewed patient's old medical records in [place]  Chief Complaint: SOB  HPI: Heather [name] is a 85 y.o. female with medical history significant of COPD, A.Fib on Eliquis, HTN.  Patient presents to the ED with c/o cough, SOB, sore throat, fever, headache, body and muscle aches.  Onset over the past 1 day or so.  No known specific ill contacts.   ED Course: Satting 89% on RA even after neb treatments and solumedrol.  Influenza PCR pending, hospitalist asked to admit.  CXR neg.   Review of Systems: As per HPI otherwise 10 point review of systems negative.   Past Medical History:  Diagnosis Date  . Anxiety   . Arthritis   . Atrial fibrillation ([place])   . COPD (chronic obstructive pulmonary disease) ([place])   . Dysrhythmia    AF  . Essential hypertension, benign   . Gallstones   . GERD (gastroesophageal reflux disease)   . H/O hiatal hernia   . History of nuclear stress test 10/2010   dipyridamole; normal pattern of perfusion; low risk, normal study  . 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 Mild aortic insufficiency   . Osteoporosis   . Peripheral neuropathy   . Pneumonia    states she had it twice, last time a couple of months ago  . PONV (postoperative nausea and vomiting)   . Shortness of breath    with exertion  . Unspecified hypothyroidism   . Venous insufficiency     Past Surgical History:  Procedure Laterality Date  . APPENDECTOMY  1935  . BACK SURGERY     x2  . Cardiometablic Testing  09/04/3708   submaximal effort with peak RER of 0.5, peak VO2 79% predicted; HR peak up to 78%; PVC was 55% predicted, PEV1 39% predicted; PEV1/VC ratio was reduced; normal vital capacity; DLCO was reduced to 63%  . CARPAL TUNNEL RELEASE    . CATARACT EXTRACTION W/  INTRAOCULAR LENS  IMPLANT, BILATERAL    . CHOLECYSTECTOMY  04/07/2014   dr toth  . CHOLECYSTECTOMY N/A 04/07/2014   Procedure: LAPAROSCOPIC CHOLECYSTECTOMY WITH INTRAOPERATIVE CHOLANGIOGRAM POSSBILE OPEN;  Surgeon: Autumn Messing III, MD;  Location: [facility];  Service: General;  Laterality: N/A;  . COLON SURGERY  2008   partial  . ESOPHAGOGASTRODUODENOSCOPY (EGD) WITH PROPOFOL N/A 05/25/2017   Procedure: ESOPHAGOGASTRODUODENOSCOPY (EGD) WITH PROPOFOL;  Surgeon: Clarene Essex, MD;  Location: [facility];  Service: Endoscopy;  Laterality: N/A;  . HERNIA REPAIR    . JOINT REPLACEMENT    . left hip relacement  2000  . SAVORY DILATION N/A 05/25/2017   Procedure: SAVORY DILATION;  Surgeon: Clarene Essex, MD;  Location[facility] END[facility]COPY;  Service: Endoscopy;  Laterality: N/A;  . TONSILLECTOMY  1935  . TOTAL ABDOMINAL HYSTERECTOMY  1970  . TRANSTHORACIC ECHOCARDIOGRAM  05/2011   EF=>55%; mild conc LVH; mild mitral annular calcif; mild TR; AV mildly sclerotic & mild AR  . VENTRAL HERNIA REPAIR  09/19/2011   Procedure: LAPAROSCOPIC VENTRAL HERNIA;  Surgeon: Merrie Roof, MD;  Location: [facility];  Service: General;  Laterality: N/A;  laparoscopic ventral hernia repair with mesh     reports that she quit smoking about 39 years ago. Her smoking use included cigarettes. She has a 20.00 pack-year smoking history. She has never used smokeless tobacco.  She reports current alcohol use. She reports that she does not use drugs.  Allergies  Allergen Reactions  . Ace Inhibitors Cough  . Sulfur Nausea And Vomiting    Family History  Problem Relation Age of Onset  . Stroke Mother   . Stroke Father   . Heart block Brother   . Bladder Cancer Brother   . Diabetes Brother   . Stroke Brother      Prior to Admission medications   Medication Sig Start Date End Date Taking? Authorizing Provider  albuterol (PROVENTIL HFA;VENTOLIN HFA) 108 (90 BASE) MCG/ACT inhaler Inhale 2 puffs into the lungs every 6 (six) hours as needed for  wheezing or shortness of breath. 05/23/13  Yes Burnard Bunting, MD  albuterol (PROVENTIL) (2.5 MG/3ML) 0.083% nebulizer solution Take 2.5 mg by nebulization every 6 (six) hours as needed for wheezing or shortness of breath.   Yes [provider]  budesonide-formoterol (SYMBICORT) 160-4.5 MCG/ACT inhaler Inhale 2 puffs into the lungs 2 (two) times daily.    Yes [provider]  calcium citrate-vitamin D (CITRACAL+D) 315-200 MG-UNIT per tablet Take 1 tablet by mouth daily.   Yes [provider]  Carboxymethylcellulose Sodium (THERATEARS OP) Place 2 drops into both eyes 2 (two) times daily.   Yes [provider]  diltiazem (CARDIZEM CD) 180 MG 24 hr capsule Take 180 mg by mouth daily.   Yes [provider]  ELIQUIS 2.5 MG TABS tablet TAKE 1 TABLET BY MOUTH TWICE DAILY Patient taking differently: Take 2.5 mg by mouth 2 (two) times daily.  11/23/17  Yes Hilty, Nadean Corwin, MD  furosemide (LASIX) 40 MG tablet Take 1 tablet (40 mg total) by mouth as needed. 07/15/17  Yes Almyra Deforest, PA  hydrochlorothiazide (HYDRODIURIL) 25 MG tablet Take 1 tablet (25 mg total) by mouth daily. 03/17/18  Yes Hilty, Nadean Corwin, MD  Lactobacillus Reuteri (BIOGAIA PROBIOTIC PO) Take 1 capsule by mouth daily.   Yes [provider]  levothyroxine (SYNTHROID, LEVOTHROID) 137 MCG tablet Take 137 mcg by mouth daily before breakfast.    Yes [provider]  losartan (COZAAR) 100 MG tablet TAKE 1 TABLET EVERY DAY  (KEEP  OFFICE  VISIT) Patient taking differently: Take 100 mg by mouth daily.  08/11/17  Yes Hilty, Nadean Corwin, MD  Multiple Vitamins-Minerals (MULTIVITAMIN WITH MINERALS) tablet Take 1 tablet by mouth daily.   Yes [provider]  polyethylene glycol (MIRALAX / GLYCOLAX) packet Take 17 g by mouth every other day.    Yes [provider]  Teriparatide, Recombinant, (FORTEO [place]) Inject 1 pen into the skin daily.   Yes [provider]    Physical  Exam: Vitals:   04/24/18 2145 04/24/18 2200 04/24/18 2230 04/24/18 2338  BP: (!) 112/54 (!) 111/53 119/60   Pulse: (!) 112 (!) 109 (!) 119   Resp: (!) 30 20 (!) 36   Temp:    99.8 F (37.7 C)  TempSrc:    Oral  SpO2: (!) 87% 98% 92%   Weight:    45.4 kg  Height:    5\' 3"  (1.6 m)    Constitutional: NAD, calm, comfortable, eating a sandwich Eyes: PERRL, lids and conjunctivae normal ENMT: Mucous membranes are moist. Posterior pharynx clear of any exudate or lesions.Normal dentition.  Neck: normal, supple, no masses, no thyromegaly Respiratory: Faint diffuse wheezes  Cardiovascular: Regular rate and rhythm, no murmurs / rubs / gallops. No extremity edema. 2+ pedal pulses. No carotid bruits.  Abdomen: no tenderness,  no masses palpated. No hepatosplenomegaly. Bowel sounds positive.  Musculoskeletal: no clubbing / cyanosis. No joint deformity upper and lower extremities. Good ROM, no contractures. Normal muscle tone.  Skin: no rashes, lesions, ulcers. No induration Neurologic: CN 2-12 grossly intact. Sensation intact, DTR normal. Strength 5/5 in all 4.  Psychiatric: Normal judgment and insight. Alert and oriented x 3. Normal mood.    Labs on Admission: I have personally reviewed following labs and imaging studies  CBC: Recent Labs  Lab 04/24/18 1954  WBC 7.5  NEUTROABS 5.9  HGB 13.2  HCT 39.7  MCV 94.7  PLT 841   Basic Metabolic Panel: Recent Labs  Lab 04/24/18 1954  NA 139  K 3.1*  CL 99  CO2 28  GLUCOSE 130*  BUN 23  CREATININE 0.73  CALCIUM 9.5   GFR: Estimated Creatinine Clearance: 34.2 mL/min (by C-G formula based on SCr of 0.73 mg/dL). Liver Function Tests: Recent Labs  Lab 04/24/18 1954  AST 28  ALT 24  ALKPHOS 77  BILITOT 0.9  PROT 6.5  ALBUMIN 3.9   No results for input(s): LIPASE, AMYLASE in the last 168 hours. No results for input(s): AMMONIA in the last 168 hours. Coagulation Profile: No results for input(s): INR, PROTIME in the last 168  hours. Cardiac Enzymes: No results for input(s): CKTOTAL, CKMB, CKMBINDEX, TROPONINI in the last 168 hours. BNP (last 3 results) No results for input(s): PROBNP in the last 8760 hours. HbA1C: No results for input(s): HGBA1C in the last 72 hours. CBG: No results for input(s): GLUCAP in the last 168 hours. Lipid Profile: No results for input(s): CHOL, HDL, LDLCALC, TRIG, CHOLHDL, LDLDIRECT in the last 72 hours. Thyroid Function Tests: No results for input(s): TSH, T4TOTAL, FREET4, T3FREE, THYROIDAB in the last 72 hours. Anemia Panel: No results for input(s): VITAMINB12, FOLATE, FERRITIN, TIBC, IRON, RETICCTPCT in the last 72 hours. Urine analysis:    Component Value Date/Time   COLORURINE YELLOW 01/25/2018 2005   APPEARANCEUR CLEAR 01/25/2018 2005   LABSPEC 1.015 01/25/2018 2005   PHURINE 7.0 01/25/2018 2005   GLUCOSEU NEGATIVE 01/25/2018 2005   HGBUR NEGATIVE 01/25/2018 2005   BILIRUBINUR NEGATIVE 01/25/2018 2005   [place] NEGATIVE 01/25/2018 2005   PROTEINUR NEGATIVE 01/25/2018 2005   UROBILINOGEN 0.2 05/17/2013 1207   NITRITE NEGATIVE 01/25/2018 2005   LEUKOCYTESUR NEGATIVE 01/25/2018 2005    Radiological Exams on Admission: Dg Chest 2 View  Result Date: 04/24/2018 CLINICAL DATA:  Cough and shortness of breath. EXAM: CHEST - 2 VIEW COMPARISON:  03/24/2018, additional prior FINDINGS: Chronic posterior elevation right hemidiaphragm. Chronic hyperinflation and emphysema. Unchanged heart size and mediastinal contours. Aortic atherosclerosis. No acute airspace disease, pleural effusion or pneumothorax. Multiple compression fractures in the thoracic spine, as seen on prior. No acute osseous abnormalities. IMPRESSION: Chronic hyperinflation and emphysema. No acute abnormality. Aortic Atherosclerosis (ICD10-I70.0) and Emphysema (ICD10-J43.9). Electronically Signed   By: Keith Rake M.D.   On: 04/24/2018 20:32    EKG: Independently reviewed.  Assessment/Plan Principal  Problem:   Influenza-like illness Active Problems:   PAF (paroxysmal atrial fibrillation) (HCC)   HTN (hypertension)   COPD with acute exacerbation ([place])    1. ILI - suspicious for flu 1. Tamiflu 2. Stop tamiflu and switch to empiric ABx for COPD exacerbation if influenza PCR neg 2. COPD exacerbation - 1. COPD pathway 2. Scheduled and PRN nebs 3. Adult wheeze protocol 4. Prednisone 5. Tamiflu as above for the moment 3. HTN - 1. Holding diuretics and losartan for the  moment 2. Continue cardizem 4. PAF - 1. Cont cardizem 2. Cont eliquis  DVT prophylaxis: Eliquis Code Status: Full Family Communication: Daughter at bedside Disposition Plan: Home after admit Consults called: None Admission status: Admit to inpatient  Severity of Illness: The appropriate patient status for this patient is INPATIENT. Inpatient status is judged to be reasonable and necessary in order to provide the required intensity of service to ensure the patient's safety. The patient's presenting symptoms, physical exam findings, and initial radiographic and laboratory data in the context of their chronic comorbidities is felt to place them at high risk for further clinical deterioration. Furthermore, it is not anticipated that the patient will be medically stable for discharge from the hospital within 2 midnights of admission. The following factors support the patient status of inpatient.   " The patient's presenting symptoms include SOB, ILI symptoms. " The worrisome physical exam findings include fever, tachypnea, tachycardia, SOB, O2 borderline low. " The chronic co-morbidities include COPD.   * I certify that at the point of admission it is my clinical judgment that the patient will require inpatient hospital care spanning beyond 2 midnights from the point of admission due to high intensity of service, high risk for further deterioration and high frequency of surveillance required.Etta Quill  DO [facility] Pager [phone] Only works nights!  If 7AM-7PM, please contact the primary day team physician taking care of patient  [email or web address] Password [email or web address]  04/25/2018, 12:02 AM

## 2018-04-25 NOTE — Progress Notes (Signed)
Occupational Therapy Evaluation Patient Details Name: Heather [name] MRN: [id number] DOB: October 22, 1926 Today's Date: 04/25/2018    History of Present Illness Heather [name] is a 85 y.o. female with medical history significant of COPD, A.Fib on Eliquis, HTN. Patient admitted to the ED with c/o cough, SOB, sore throat, fever, headache, body and muscle aches.    Clinical Impression   PTA, pt lived at home with her husband and daughter and was modified independent with ADL and mobility @ cane level. Pt currently limited with ADL due to O2 desat to 88 during functional tasks and 83 during hallway ambulation on RA with 3/4 dyspnea. Recommend HHOT after DC to facilitate return to PLOF and reduce risk of falls.     Follow Up Recommendations  Home health OT;Supervision - Intermittent    Equipment Recommendations  3 in 1 bedside commode(to use as showerchair)    Recommendations for Other Services       Precautions / Restrictions Precautions Precautions: Fall Precaution Comments: watch O2 Sats Restrictions Weight Bearing Restrictions: No      Mobility Bed Mobility Overal bed mobility: Modified Independent                Transfers Overall transfer level: Needs assistance Equipment used: None Transfers: Sit to/from Stand Sit to Stand: Supervision              Balance Overall balance assessment: Needs assistance   Sitting balance-Leahy Scale: Good       Standing balance-Leahy Scale: Fair Standing balance comment: prefers to use RW                           ADL either performed or assessed with clinical judgement   ADL Overall ADL's : Needs assistance/impaired     Grooming: Set up;Standing   Upper Body Bathing: Set up;Sitting   Lower Body Bathing: Set up;Sit to/from stand   Upper Body Dressing : Set up;Sitting   Lower Body Dressing: Set up;Sit to/from stand   Toilet Transfer: Supervision/safety;Ambulation   Toileting- Clothing Manipulation  and Hygiene: Modified independent;Sit to/from stand Toileting - Clothing Manipulation Details (indicate cue type and reason): incontinenet of urine     Functional mobility during ADLs: Supervision/safety;Rolling walker General ADL Comments: Desat to 34 during ADL on RA     Vision         Perception     Praxis      Pertinent Vitals/Pain Pain Assessment: No/denies pain     Hand Dominance Right   Extremity/Trunk Assessment Upper Extremity Assessment Upper Extremity Assessment: Generalized weakness   Lower Extremity Assessment Lower Extremity Assessment: Defer to PT evaluation   Cervical / Trunk Assessment Cervical / Trunk Assessment: Normal   Communication Communication Communication: No difficulties   Cognition Arousal/Alertness: Awake/alert Behavior During Therapy: WFL for tasks assessed/performed Overall Cognitive Status: Within Functional Limits for tasks assessed                                     General Comments       Exercises Exercises: Other exercises Other Exercises Other Exercises: pursed lip breathing   Shoulder Instructions      Home Living Family/patient expects to be discharged to:: Private residence Living Arrangements: Spouse/significant other;Children Available Help at Discharge: Family Type of Home: House Home Access: Level entry     Home Layout: One level  Bathroom Shower/Tub: Advertising copywriter: Yes How Accessible: Accessible via walker Home Equipment: Grab bars - toilet;Grab bars - tub/shower;Walker - 2 wheels;Cane - single point          Prior Functioning/Environment Level of Independence: Independent with assistive device(s)        Comments: SPC for household ambulation sometimes. I with ADLs ; does light cooking and laundry        OT Problem List: Decreased activity tolerance;Decreased knowledge of use of DME or AE      OT  Treatment/Interventions: Self-care/ADL training;Energy conservation;DME and/or AE instruction;Therapeutic activities;Patient/family education    OT Goals(Current goals can be found in the care plan section) Acute Rehab OT Goals Patient Stated Goal: to be able to be independent  OT Goal Formulation: With patient Time For Goal Achievement: 05/09/18 Potential to Achieve Goals: Good  OT Frequency: Min 2X/week   Barriers to D/C:            Co-evaluation              AM-PAC OT "6 Clicks" Daily Activity     Outcome Measure Help from another person eating meals?: None Help from another person taking care of personal grooming?: None Help from another person toileting, which includes using toliet, bedpan, or urinal?: A Little Help from another person bathing (including washing, rinsing, drying)?: A Little Help from another person to put on and taking off regular upper body clothing?: None Help from another person to put on and taking off regular lower body clothing?: A Little 6 Click Score: 21   End of Session Equipment Utilized During Treatment: Gait belt;Rolling walker Nurse Communication: Mobility status;Other (comment)(O2 sats)  Activity Tolerance: Patient tolerated treatment well Patient left: in bed;with call bell/phone within reach;with family/visitor present  OT Visit Diagnosis: Other (comment);Muscle weakness (generalized) (M62.81)(Decreased activity tolerance)                Time: 8338-2505 OT Time Calculation (min): 24 min Charges:  OT General Charges $OT Visit: 1 Visit OT Evaluation $OT Eval Low Complexity: 1 Low OT Treatments $Self Care/Home Management : 8-22 mins  Maurie Boettcher, OT/L   Acute OT Clinical Specialist [place] Pager [phone] Office [phone]   [facility] 04/25/2018, 6:41 PM

## 2018-04-25 NOTE — Progress Notes (Signed)
Assisted pt in ordering lunch

## 2018-04-25 NOTE — Progress Notes (Signed)
Pt daughter at bedside, provided home medication - forteo 50mcg Pt takes once daily  RN sent medication to pharmacy, corresponding paper work in shadow chart

## 2018-04-25 NOTE — ED Notes (Signed)
Attempted to ambulate pt on pulse ox. Pt's O2 sat dropped to 86% on RA while resting in bed. Pt placed back on 2L [place]

## 2018-04-25 NOTE — Progress Notes (Signed)
Patient seen and examined at bedside, patient admitted after midnight, please see earlier detailed admission note by Etta Quill, DO. Briefly, patient presented with shortness of breath found to have a COPD exacerbation secondary to influenza B infection. Currently on oxygen, Tamiflu, prednisone. No changes to management. Hopefully home tomorrow if able to wean to room air.   Cordelia Poche, MD [facility] 04/25/2018, 3:35 PM

## 2018-04-26 DIAGNOSIS — E43 Unspecified severe protein-calorie malnutrition: Secondary | ICD-10-CM

## 2018-04-26 MED ORDER — IPRATROPIUM-ALBUTEROL 0.5-2.5 (3) MG/3ML IN SOLN: 3.0000 mL | RESPIRATORY_TRACT | Status: DC | PRN

## 2018-04-26 MED ORDER — IPRATROPIUM BROMIDE 0.02 % IN SOLN: 0.5000 mg | Freq: Two times a day (BID) | RESPIRATORY_TRACT | Status: DC

## 2018-04-26 MED ORDER — ENSURE ENLIVE PO LIQD
237.0000 mL | Freq: Two times a day (BID) | ORAL | Status: DC
  Administered 2018-04-26 – 2018-04-27 (×3): 237 mL via ORAL

## 2018-04-26 MED ORDER — IPRATROPIUM-ALBUTEROL 0.5-2.5 (3) MG/3ML IN SOLN
3.0000 mL | Freq: Four times a day (QID) | RESPIRATORY_TRACT | Status: DC | PRN
  Administered 2018-04-26: 3 mL via RESPIRATORY_TRACT
  Filled 2018-04-26: qty 3

## 2018-04-26 MED ORDER — METHYLPREDNISOLONE SODIUM SUCC 40 MG IJ SOLR
40.0000 mg | Freq: Two times a day (BID) | INTRAMUSCULAR | Status: DC
  Administered 2018-04-26 – 2018-04-27 (×2): 40 mg via INTRAVENOUS
  Filled 2018-04-26 (×3): qty 1

## 2018-04-26 NOTE — Care Management Note (Deleted)
Case Management Note  Patient Details  Name: Heather [name] MRN: [id number] Date of Birth: 1927/01/13  Subjective/Objective:                    Action/Plan:   Expected Discharge Date:  04/26/18               Expected Discharge Plan:  [place]  In-House Referral:     Discharge planning Services  CM Consult  Post Acute Care Choice:    Choice offered to:     DME Arranged:    DME Agency:     HH Arranged:  Patient Refused [place] Agency:     Status of Service:     If discussed at [employer], dates discussed:    Additional Comments:  Royston Bake, RN 04/26/2018, 3:22 PM

## 2018-04-26 NOTE — Progress Notes (Signed)
Initial Nutrition Assessment  DOCUMENTATION CODES:   Severe malnutrition in context of chronic illness  INTERVENTION:   - Ensure Enlive BID (each provides 350 kcal, 20 g protein) - Continue Oscal with D and MVI with minerals as ordered - Given difficulty swallowing, pt would benefit from SLP visit   NUTRITION DIAGNOSIS:   Severe Malnutrition related to chronic illness as evidenced by percent weight loss, severe fat depletion, severe muscle depletion.  GOAL:   Patient will meet greater than or equal to 90% of their needs    MONITOR:   PO intake, Supplement acceptance, Labs, Weight trends  REASON FOR ASSESSMENT:   Consult Assessment of nutrition requirement/status  ASSESSMENT:   85 yo female, admitted with influenza B. PMH significant for COPD, a fib on Eliquis, HTN, GERD, h/o hiatal hernia, gallstones, osteoporosis, hypothyroidism.   Labs: potassium 3.1, glucose 130, positive for Flu B Meds: Oscal with D, levothyroxine, MVI with minerals, Miralax, NS at 75 mL/hr continuous  Pt sitting in chair at time of visit.  Reports good, normal appetite. Typically eats 3 meals daily. Does not follow any special diet and takes MVI at home.   Per chart, wt stable within 3 kg range x 1 year. UBW 140# several years ago - has lost and been unable to regain weight over the years through multiple illnesses, fractures, wounds. Discussed how needs increase with illness/injury.  Endorses some nausea - Zofran helps this. Pt uses Miralax and stool softener at home PRN. Describes long-standing difficulty swallowing - has had esophagus stretched. States she takes small bites and chews her food thoroughly. Pt would benefit from SLP visit.  Encouraged pt to include protein-rich foods with all meals and snacks, and to eat those foods first if not feeling hungry. Pt agreeable to trying Ensure BID in strawberry.   NUTRITION - FOCUSED PHYSICAL EXAM:    Most Recent Value  Orbital Region  Moderate  depletion  Thoracic and Lumbar Region  Severe depletion  Buccal Region  Severe depletion  Temple Region  Severe depletion  Clavicle Bone Region  Severe depletion  Clavicle and Acromion Bone Region  Severe depletion  Scapular Bone Region  Severe depletion  Dorsal Hand  Severe depletion  Patellar Region  Severe depletion  Anterior Thigh Region  Severe depletion  Posterior Calf Region  Moderate depletion  Edema (RD Assessment)  Mild  Hair  Reviewed  Eyes  Reviewed  Mouth  Reviewed  Nails  Reviewed       Diet Order:  50% x 2 meals 1/26, per nsg documentation Diet Order            Diet Heart Room service appropriate? Yes; Fluid consistency: Thin  Diet effective now              EDUCATION NEEDS:   Education needs have been addressed  Skin:  Skin Assessment: Skin Integrity Issues: Skin Integrity Issues:: Other (Comment) Other: ecchymosis - BL arms  Last BM:  1/26  Height:   Ht Readings from Last 1 Encounters:  04/24/18 5\' 3"  (1.6 m)    Weight:   Wt Readings from Last 1 Encounters:  04/26/18 48.9 kg    Ideal Body Weight:  52.2 kg  BMI:  Body mass index is 19.11 kg/m.  Estimated Nutritional Needs:   Kcal:  1467-1711 (30-35 kcal/kg ABW)  Protein:  59-73 gm (1.2-1.5 g/kg ABW)  Fluid:  1 mL/kcal or per MD  Althea Grimmer, MS, RDN, LDN Pager: [phone] Available Mondays and Fridays, 9am-2pm

## 2018-04-26 NOTE — Progress Notes (Signed)
Physical Therapy Treatment Patient Details Name: Heather [name] MRN: [id number] DOB: [birth year] Today's Date: 04/26/2018    History of Present Illness Heather [name] is a 85 y.o. female with medical history significant of COPD, A.Fib on Eliquis, HTN. Patient admitted to the ED with c/o cough, SOB, sore throat, fever, headache, body and muscle aches. + Flu B    PT Comments    Patient progressing well towards PT goals. Improved ambulation distance with Min guard for balance/safety. Balance and energy conservation much better with use of RW. Recommend using RW at home initially, pt agreeable. Sp02 dropped to 86% on RA during mobility, 3/4 DOE. 1 standing rest break needed. Required supplemental 02 to maintain sats >90% with activity. Encouraged walking with nursing 2 more times today. Will continue to follow and progress as tolerated.     Follow Up Recommendations  Home health PT     Equipment Recommendations  None recommended by PT    Recommendations for Other Services       Precautions / Restrictions Precautions Precautions: Fall Precaution Comments: watch O2 Sats Restrictions Weight Bearing Restrictions: No    Mobility  Bed Mobility               General bed mobility comments: Sitting in chair upon PT arrival.   Transfers Overall transfer level: Needs assistance Equipment used: Rolling walker (2 wheeled) Transfers: Sit to/from Stand Sit to Stand: Supervision         General transfer comment: Supervision for safety. Stood from Youth worker.   Ambulation/Gait Ambulation/Gait assistance: Min guard Gait Distance (Feet): 150 Feet Assistive device: Rolling walker (2 wheeled) Gait Pattern/deviations: Step-through pattern;Decreased stride length;Trunk flexed Gait velocity: decreased   General Gait Details: Slow, mostly steady gait with RW for support; 3/4 DOE. Sp02 dropped to 86% on RA, donned 2L/min 02 and Sp02 >90%. 1 standing rest break.    Stairs             Wheelchair Mobility    Modified Rankin (Stroke Patients Only)       Balance Overall balance assessment: Needs assistance Sitting-balance support: Feet supported;No upper extremity supported Sitting balance-Leahy Scale: Good Sitting balance - Comments: Able to reach down and donn socks wtihout difficulty; fatigued after and SOB noted.    Standing balance support: During functional activity Standing balance-Leahy Scale: Fair Standing balance comment: prefers to use RW                            Cognition Arousal/Alertness: Awake/alert Behavior During Therapy: WFL for tasks assessed/performed Overall Cognitive Status: Within Functional Limits for tasks assessed                                        Exercises      General Comments        Pertinent Vitals/Pain Pain Assessment: No/denies pain    Home Living                      Prior Function            PT Goals (current goals can now be found in the care plan section) Progress towards PT goals: Progressing toward goals    Frequency    Min 3X/week      PT Plan Current plan remains appropriate    Co-evaluation  AM-PAC PT "6 Clicks" Mobility   Outcome Measure  Help needed turning from your back to your side while in a flat bed without using bedrails?: None Help needed moving from lying on your back to sitting on the side of a flat bed without using bedrails?: None Help needed moving to and from a bed to a chair (including a wheelchair)?: A Little Help needed standing up from a chair using your arms (e.g., wheelchair or bedside chair)?: A Little Help needed to walk in hospital room?: A Little Help needed climbing 3-5 steps with a railing? : A Little 6 Click Score: 20    End of Session Equipment Utilized During Treatment: Gait belt;Oxygen Activity Tolerance: Patient tolerated treatment well;Treatment limited secondary to medical complications  (Comment)(drop in Sp02) Patient left: in chair;with call bell/phone within reach Nurse Communication: Mobility status;Other (comment)(need for 02) PT Visit Diagnosis: Unsteadiness on feet (R26.81)     Time: 5146-0479 PT Time Calculation (min) (ACUTE ONLY): 27 min  Charges:  $Gait Training: 23-37 mins                     Wray Kearns, [place], DPT Acute Rehabilitation Services Pager [phone] Office [place] 04/26/2018, 11:07 AM

## 2018-04-26 NOTE — Care Management Note (Signed)
Case Management Note  Patient Details  Name: Heather [name] MRN: [id number] Date of Birth: 1926/10/20  Subjective/Objective:       Influenza            Action/Plan: Patient lives at home with spouse and daughter; PCP Sr Burnard Bunting; has private insurance with [facility] Medicare with prescription drug coverage; pharmacy of choice is [facility]; patient reports no problem getting her medication; no DME; pt is refusing all [place] services at this time; CM will continue to follow for progression off care  Expected Discharge Date:  Possibly 04/27/2018             Expected Discharge Plan:  [place]  Discharge planning Services  CM Consult    HH Arranged:  Patient Refused [facility] Status of Service:   In progress  Sherrilyn Rist [phone] 04/26/2018, 3:18 PM

## 2018-04-26 NOTE — Progress Notes (Signed)
PROGRESS NOTE    Heather [name]  HQI:[id number] DOB: 02/19/1927 DOA: 04/24/2018 PCP: Burnard Bunting, MD   Brief Narrative: Heather [name] is a 85 y.o. female with a history of atrial fibrillation, hypertension, COPD. Patient presented with shortness of breath and found to have influenza B infection. Also with a COPD exacerbation currently being treated.   Assessment & Plan:   Principal Problem:   Influenza B Active Problems:   PAF (paroxysmal atrial fibrillation) (HCC)   HTN (hypertension)   COPD with acute exacerbation (HCC)   Protein-calorie malnutrition, severe   Influenza B infection Afebrile overnight. -Continue Tamiflu  COPD exacerbation Secondary to above. Weaned to room air at rest but needing oxygen on ambulation with significant dyspnea on exertion below baseline -Duoneb, Dulera -Oxygen as needed -Switch to solumedrol today  Severe malnutrition -Ensure  Paroxysmal atrial fibrillation -Continue Eliquis and diltiazem  Hypothyroidism -Continue Synthroid  Chronic diastolic heart failure On lasix as an outpatient -Hold for now   DVT prophylaxis: Eliquis Code Status:   Code Status: Full Code Family Communication: None at bedside Disposition Plan: Discharge tomorrow if close to baseline   Consultants:   None  Procedures:   None  Antimicrobials:  Tamiflu    Subjective: Cough, congestion. Very dyspneic with ambulation.  Objective: Vitals:   04/25/18 2003 04/25/18 2011 04/26/18 0448 04/26/18 0827  BP:  (!) 100/57 117/81   Pulse:  87 90   Resp:  18 18   Temp:  98.4 F (36.9 C) 98.4 F (36.9 C)   TempSrc:  Oral Oral   SpO2: 93% 98% 96% 94%  Weight:   48.9 kg   Height:        Intake/Output Summary (Last 24 hours) at 04/26/2018 1456 Last data filed at 04/26/2018 0300 Gross per 24 hour  Intake 928.3 ml  Output -  Net 928.3 ml   Filed Weights   04/24/18 2338 04/26/18 0448  Weight: 45.4 kg 48.9 kg    Examination:  General  exam: Appears calm and comfortable Respiratory system: diminished on auscultation. Respiratory effort normal while at rest Cardiovascular system: S1 & S2 heard, RRR. No murmurs, rubs, gallops or clicks. Gastrointestinal system: Abdomen is nondistended, soft and nontender. No organomegaly or masses felt. Normal bowel sounds heard. Central nervous system: Alert and oriented. No focal neurological deficits. Extremities: No edema. No calf tenderness Skin: No cyanosis. No rashes Psychiatry: Judgement and insight appear normal. Mood & affect appropriate.     Data Reviewed: I have personally reviewed following labs and imaging studies  CBC: Recent Labs  Lab 04/24/18 1954  WBC 7.5  NEUTROABS 5.9  HGB 13.2  HCT 39.7  MCV 94.7  PLT 132   Basic Metabolic Panel: Recent Labs  Lab 04/24/18 1954  NA 139  K 3.1*  CL 99  CO2 28  GLUCOSE 130*  BUN 23  CREATININE 0.73  CALCIUM 9.5   GFR: Estimated Creatinine Clearance: 36.8 mL/min (by C-G formula based on SCr of 0.73 mg/dL). Liver Function Tests: Recent Labs  Lab 04/24/18 1954  AST 28  ALT 24  ALKPHOS 77  BILITOT 0.9  PROT 6.5  ALBUMIN 3.9   No results for input(s): LIPASE, AMYLASE in the last 168 hours. No results for input(s): AMMONIA in the last 168 hours. Coagulation Profile: No results for input(s): INR, PROTIME in the last 168 hours. Cardiac Enzymes: No results for input(s): CKTOTAL, CKMB, CKMBINDEX, TROPONINI in the last 168 hours. BNP (last 3 results) No results for input(s): PROBNP  in the last 8760 hours. HbA1C: No results for input(s): HGBA1C in the last 72 hours. CBG: No results for input(s): GLUCAP in the last 168 hours. Lipid Profile: No results for input(s): CHOL, HDL, LDLCALC, TRIG, CHOLHDL, LDLDIRECT in the last 72 hours. Thyroid Function Tests: No results for input(s): TSH, T4TOTAL, FREET4, T3FREE, THYROIDAB in the last 72 hours. Anemia Panel: No results for input(s): VITAMINB12, FOLATE, FERRITIN,  TIBC, IRON, RETICCTPCT in the last 72 hours. Sepsis Labs: No results for input(s): PROCALCITON, LATICACIDVEN in the last 168 hours.  No results found for this or any previous visit (from the past 240 hour(s)).       Radiology Studies: Dg Chest 2 View  Result Date: 04/24/2018 CLINICAL DATA:  Cough and shortness of breath. EXAM: CHEST - 2 VIEW COMPARISON:  03/24/2018, additional prior FINDINGS: Chronic posterior elevation right hemidiaphragm. Chronic hyperinflation and emphysema. Unchanged heart size and mediastinal contours. Aortic atherosclerosis. No acute airspace disease, pleural effusion or pneumothorax. Multiple compression fractures in the thoracic spine, as seen on prior. No acute osseous abnormalities. IMPRESSION: Chronic hyperinflation and emphysema. No acute abnormality. Aortic Atherosclerosis (ICD10-I70.0) and Emphysema (ICD10-J43.9). Electronically Signed   By: Keith Rake M.D.   On: 04/24/2018 20:32        Scheduled Meds: . apixaban  2.5 mg Oral BID  . calcium-vitamin D  1 tablet Oral Daily  . diltiazem  180 mg Oral Daily  . feeding supplement (ENSURE ENLIVE)  237 mL Oral BID BM  . ipratropium  0.5 mg Nebulization BID  . levothyroxine  137 mcg Oral Q0600  . mometasone-formoterol  1 puff Inhalation BID  . multivitamin with minerals  1 tablet Oral Daily  . oseltamivir  30 mg Oral BID  . polyethylene glycol  17 g Oral QODAY  . polyvinyl alcohol   Both Eyes BID  . predniSONE  40 mg Oral Q breakfast  . Teriparatide (Recombinant)   Subcutaneous Daily   Continuous Infusions: . sodium chloride 75 mL/hr at 04/26/18 0016     LOS: 2 days     Cordelia Poche, MD [facility] 04/26/2018, 2:56 PM  If 7PM-7AM, please contact night-coverage [email or web address]

## 2018-04-26 NOTE — Progress Notes (Signed)
SATURATION QUALIFICATIONS: (This note is used to comply with regulatory documentation for home oxygen)  Patient Saturations on Room Air at Rest = 90%  Patient Saturations on Room Air while Ambulating = 86%  Patient Saturations on 1 Liters of oxygen while Ambulating = 91%  Please briefly explain why patient needs home oxygen: Pt not able to maintain Sp02 >90% on RA with mobility.    Wray Kearns, PT, DPT Acute Rehabilitation Services Pager [phone] Office [phone]

## 2018-04-26 NOTE — Progress Notes (Signed)
Occupational Therapy Treatment Patient Details Name: Heather [name] MRN: [id number] DOB: 03-21-1927 Today's Date: 04/26/2018    History of present illness Heather [name] is a 85 y.o. female with medical history significant of COPD, A.Fib on Eliquis, HTN. Patient admitted to the ED with c/o cough, SOB, sore throat, fever, headache, body and muscle aches. + Flu B   OT comments  Pt desats to 85 during ADL task as noted below on RA. 4/4 dyspnea. Pt will need 2L home O2 to complete ADL and funcitonal mobility for ADL. Educated pton energy conservation. Pt needs 3in1 for safe DC home. Continue to recommend [place]. .   Follow Up Recommendations  Home health OT;Supervision - Intermittent    Equipment Recommendations  3 in 1 bedside commode    Recommendations for Other Services      Precautions / Restrictions Precautions Precautions: Fall Precaution Comments: watch O2 Sats Restrictions Weight Bearing Restrictions: No       Mobility Bed Mobility Overal bed mobility: Modified Independent             General bed mobility comments: Sitting in chair upon PT arrival.   Transfers Overall transfer level: Needs assistance Equipment used: Rolling walker (2 wheeled) Transfers: Sit to/from Stand Sit to Stand: Supervision         General transfer comment: Supervision for safety. Stood from Youth worker.     Balance Overall balance assessment: Needs assistance Sitting-balance support: Feet supported;No upper extremity supported Sitting balance-Leahy Scale: Good Sitting balance - Comments: Able to reach down and donn socks wtihout difficulty; fatigued after and SOB noted.    Standing balance support: During functional activity Standing balance-Leahy Scale: Fair Standing balance comment: prefers to use RW                           ADL either performed or assessed with clinical judgement   ADL                                       Functional mobility  during ADLs: Supervision/safety;Rolling walker General ADL Comments: Ambulated to bathroom @ 20 ft, had a BM, completed hygiene after toileting and only able to ambulate 5 ft before requiring to sit adn end activity. 4/4 dyspnea. O2 85 RA. Reviewed energy conservation startegies.      Vision       Perception     Praxis      Cognition Arousal/Alertness: Awake/alert Behavior During Therapy: WFL for tasks assessed/performed Overall Cognitive Status: Within Functional Limits for tasks assessed                                          Exercises Other Exercises Other Exercises: pursed lip breathing Other Exercises: incentive spirometer x 10; pulling 739ml   Shoulder Instructions       General Comments      Pertinent Vitals/ Pain       Pain Assessment: No/denies pain  Home Living                                          Prior Functioning/Environment  Frequency  Min 2X/week        Progress Toward Goals  OT Goals(current goals can now be found in the care plan section)  Progress towards OT goals: Progressing toward goals  Acute Rehab OT Goals Patient Stated Goal: to be able to be independent  OT Goal Formulation: With patient Time For Goal Achievement: 05/09/18 Potential to Achieve Goals: Good ADL Goals Pt Will Transfer to Toilet: with modified independence;regular height toilet Additional ADL Goal #1: Pt will independently verbalize 3 energy conservation strategies  Plan Discharge plan remains appropriate    Co-evaluation                 AM-PAC OT "6 Clicks" Daily Activity     Outcome Measure   Help from another person eating meals?: None Help from another person taking care of personal grooming?: None Help from another person toileting, which includes using toliet, bedpan, or urinal?: A Little Help from another person bathing (including washing, rinsing, drying)?: A Little Help from another person  to put on and taking off regular upper body clothing?: None Help from another person to put on and taking off regular lower body clothing?: A Little 6 Click Score: 21    End of Session Equipment Utilized During Treatment: Rolling walker  OT Visit Diagnosis: Other (comment);Muscle weakness (generalized) (M62.81)   Activity Tolerance Patient limited by fatigue   Patient Left in chair;with call bell/phone within reach   Nurse Communication Mobility status;Other (comment)(O2 sats)        Time: 2763-9432 OT Time Calculation (min): 25 min  Charges: OT General Charges $OT Visit: 1 Visit OT Treatments $Self Care/Home Management : 23-37 mins  Maurie Boettcher, OT/L   Acute OT Clinical Specialist [place] Pager [phone] Office [phone]    [facility] 04/26/2018, 12:17 PM

## 2018-04-26 NOTE — Discharge Instructions (Addendum)
Heather [name],  You were in the hospital because of an influenza infection causing a COPD exacerbation and trouble breathing. You are being treated with Tamiflu, breathing treatments, steroids (prednisone). You are requiring oxygen while walking because of your infection but this should hopefully resolve over the next few days to weeks. Please make sure to follow-up with your primary care physician within one week for a hospital follow-up.   Information on my medicine - ELIQUIS (apixaban)  Why was Eliquis prescribed for you? Eliquis was prescribed for you to reduce the risk of a blood clot forming that can cause a stroke if you have a medical condition called atrial fibrillation (a type of irregular heartbeat).  What do You need to know about Eliquis ? Take your Eliquis TWICE DAILY - one tablet in the morning and one tablet in the evening with or without food. If you have difficulty swallowing the tablet whole please discuss with your pharmacist how to take the medication safely.  Take Eliquis exactly as prescribed by your doctor and DO NOT stop taking Eliquis without talking to the doctor who prescribed the medication.  Stopping may increase your risk of developing a stroke.  Refill your prescription before you run out.  After discharge, you should have regular check-up appointments with your healthcare provider that is prescribing your Eliquis.  In the future your dose may need to be changed if your kidney function or weight changes by a significant amount or as you get older.  What do you do if you miss a dose? If you miss a dose, take it as soon as you remember on the same day and resume taking twice daily.  Do not take more than one dose of ELIQUIS at the same time to make up a missed dose.  Important Safety Information A possible side effect of Eliquis is bleeding. You should call your healthcare provider right away if you experience any of the following: ? Bleeding from an  injury or your nose that does not stop. ? Unusual colored urine (red or dark brown) or unusual colored stools (red or black). ? Unusual bruising for unknown reasons. ? A serious fall or if you hit your head (even if there is no bleeding).  Some medicines may interact with Eliquis and might increase your risk of bleeding or clotting while on Eliquis. To help avoid this, consult your healthcare provider or pharmacist prior to using any new prescription or non-prescription medications, including herbals, vitamins, non-steroidal anti-inflammatory drugs (NSAIDs) and supplements.  This website has more information on Eliquis (apixaban): http://www.eliquis.com/eliquis/home

## 2018-04-27 ENCOUNTER — Inpatient Hospital Stay (HOSPITAL_COMMUNITY): Payer: Medicare HMO

## 2018-04-27 DIAGNOSIS — J9621 Acute and chronic respiratory failure with hypoxia: Secondary | ICD-10-CM

## 2018-04-27 DIAGNOSIS — J9601 Acute respiratory failure with hypoxia: Secondary | ICD-10-CM | POA: Insufficient documentation

## 2018-04-27 LAB — BASIC METABOLIC PANEL
Anion gap: 11 (ref 5–15)
BUN: 19 mg/dL (ref 8–23)
CO2: 25 mmol/L (ref 22–32)
CREATININE: 0.64 mg/dL (ref 0.44–1.00)
Calcium: 8.8 mg/dL — ABNORMAL LOW (ref 8.9–10.3)
Chloride: 101 mmol/L (ref 98–111)
GFR calc non Af Amer: >60 mL/min (ref >60)
Glucose, Bld: 161 mg/dL — ABNORMAL HIGH (ref 70–99)
Potassium: 4 mmol/L (ref 3.5–5.1)
Sodium: 137 mmol/L (ref 135–145)

## 2018-04-27 MED ORDER — PREDNISONE 20 MG PO TABS: 40.0000 mg | ORAL_TABLET | Freq: Every day | ORAL | 0 refills | Status: AC

## 2018-04-27 MED ORDER — ENSURE ENLIVE PO LIQD: 237.0000 mL | Freq: Two times a day (BID) | ORAL | 0 refills | Status: DC

## 2018-04-27 MED ORDER — OSELTAMIVIR PHOSPHATE 30 MG PO CAPS: 30.0000 mg | ORAL_CAPSULE | Freq: Two times a day (BID) | ORAL | 0 refills | Status: AC

## 2018-04-27 NOTE — Progress Notes (Signed)
CM talked to patient again at the bedside, she now wants [facility] services and chose [facility]; [place] with [facility] called for referral. Aneta Mins [phone]

## 2018-04-27 NOTE — Progress Notes (Signed)
SATURATION QUALIFICATIONS: (This note is used to comply with regulatory documentation for home oxygen)  Patient Saturations on Room Air at Rest = 98%  Patient Saturations on Room Air while Ambulating = 86%  Patient Saturations on 2 Liters of oxygen while Ambulating = 94%  Please briefly explain why patient needs home oxygen: Patient desats with exertion. Only able to walk in room and patient states she "feels winded".

## 2018-04-27 NOTE — Progress Notes (Signed)
Patient ready for discharge. 

## 2018-04-27 NOTE — Discharge Summary (Addendum)
Physician Discharge Summary  Heather [name] GNF:[id number] DOB: May 28, 1926 DOA: 04/24/2018  PCP: Burnard Bunting, MD  Admit date: 04/24/2018 Discharge date: 04/27/2018  Admitted From: Home Disposition: Home  Recommendations for Outpatient Follow-up:  1. Follow up with PCP in 1 week 2. Please obtain BMP/CBC in one week 3. Please follow up on the following pending results: None  Home Health: PT/OT Equipment/Devices: 3 in 1, oxygen  Discharge Condition: Stable CODE STATUS: Full code Diet recommendation: Heart healthy   Brief/Interim Summary:  Admission HPI written by Etta Quill, DO   Chief Complaint: SOB  HPI: Heather [name] is a 85 y.o. female with medical history significant of COPD, A.Fib on Eliquis, HTN.  Patient presents to the ED with c/o cough, SOB, sore throat, fever, headache, body and muscle aches.  Onset over the past 1 day or so.  No known specific ill contacts.    Hospital course:  Influenza B infection Afebrile overnight. Tamiflu started. Complete 5 day course.  COPD exacerbation Acute respiratory failure with hypoxia Secondary to above. Weaned to room air at rest but needing oxygen on ambulation. Home with oxygen tank to use 2L via [place] while ambulating. Discharge with prednisone and continue home regimen.  Severe malnutrition Ensure  Paroxysmal atrial fibrillation Continue Eliquis and diltiazem  Hypothyroidism Continue Synthroid  Chronic diastolic heart failure On lasix as an outpatient as needed. Resume on discharge.  Distended abdomen Chronic per patient. Slightly increased. Normal exam. Abdominal x-ray without acute abnormalities of ileus or obstruction. Patient has had this problem for months-years  Discharge Diagnoses:  Principal Problem:   Influenza B Active Problems:   PAF (paroxysmal atrial fibrillation) (HCC)   HTN (hypertension)   COPD with acute exacerbation (HCC)   Protein-calorie malnutrition, severe   Acute  respiratory failure with hypoxia [facility])    Discharge Instructions  Discharge Instructions    Call MD for:  difficulty breathing, headache or visual disturbances   Complete by:  As directed    Diet - low sodium heart healthy   Complete by:  As directed    Increase activity slowly   Complete by:  As directed      Allergies as of 04/27/2018      Reactions   Ace Inhibitors Cough   Sulfur Nausea And Vomiting      Medication List    TAKE these medications   albuterol (2.5 MG/3ML) 0.083% nebulizer solution Commonly known as:  PROVENTIL Take 2.5 mg by nebulization every 6 (six) hours as needed for wheezing or shortness of breath.   albuterol 108 (90 Base) MCG/ACT inhaler Commonly known as:  PROVENTIL HFA;VENTOLIN HFA Inhale 2 puffs into the lungs every 6 (six) hours as needed for wheezing or shortness of breath.   BIOGAIA PROBIOTIC PO Take 1 capsule by mouth daily.   calcium citrate-vitamin D 315-200 MG-UNIT tablet Commonly known as:  CITRACAL+D Take 1 tablet by mouth daily.   diltiazem 180 MG 24 hr capsule Commonly known as:  CARDIZEM CD Take 180 mg by mouth daily.   ELIQUIS 2.5 MG Tabs tablet Generic drug:  apixaban TAKE 1 TABLET BY MOUTH TWICE DAILY What changed:  how much to take   feeding supplement (ENSURE ENLIVE) Liqd Take 237 mLs by mouth 2 (two) times daily between meals.   FORTEO [place] Inject 1 pen into the skin daily.   furosemide 40 MG tablet Commonly known as:  LASIX Take 1 tablet (40 mg total) by mouth as needed.   hydrochlorothiazide  25 MG tablet Commonly known as:  HYDRODIURIL Take 1 tablet (25 mg total) by mouth daily.   levothyroxine 137 MCG tablet Commonly known as:  SYNTHROID, LEVOTHROID Take 137 mcg by mouth daily before breakfast.   losartan 100 MG tablet Commonly known as:  COZAAR TAKE 1 TABLET EVERY DAY  (KEEP  OFFICE  VISIT) What changed:    how much to take  how to take this  when to take this  additional instructions     multivitamin with minerals tablet Take 1 tablet by mouth daily.   oseltamivir 30 MG capsule Commonly known as:  TAMIFLU Take 1 capsule (30 mg total) by mouth 2 (two) times daily for 3 days.   polyethylene glycol packet Commonly known as:  MIRALAX / GLYCOLAX Take 17 g by mouth every other day.   predniSONE 20 MG tablet Commonly known as:  DELTASONE Take 2 tablets (40 mg total) by mouth daily with breakfast for 3 days. Start taking on:  April 28, 2018   SYMBICORT 160-4.5 MCG/ACT inhaler Generic drug:  budesonide-formoterol Inhale 2 puffs into the lungs 2 (two) times daily.   THERATEARS OP Place 2 drops into both eyes 2 (two) times daily.            Durable Medical Equipment  (From admission, onward)         Start     Ordered   04/27/18 1342  For home use only DME oxygen  Once    Question Answer Comment  Mode or (Route) Nasal cannula   Liters per Minute 2   Frequency Continuous (stationary and portable oxygen unit needed)   Oxygen conserving device No   Oxygen delivery system Gas      04/27/18 1342         Follow-up Information    Burnard Bunting, MD.   Specialty:  Internal Medicine Why:  Office will call patient Contact information: [place] [address] [phone]        Home, [facility] Follow up.   Specialty:  Home Health Services Why:  They will do your home health care at your home Contact information: [address] [place] [address] [phone]          Allergies  Allergen Reactions  . Ace Inhibitors Cough  . Sulfur Nausea And Vomiting    Consultations:  None   Procedures/Studies: Dg Chest 2 View  Result Date: 04/24/2018 CLINICAL DATA:  Cough and shortness of breath. EXAM: CHEST - 2 VIEW COMPARISON:  03/24/2018, additional prior FINDINGS: Chronic posterior elevation right hemidiaphragm. Chronic hyperinflation and emphysema. Unchanged heart size and mediastinal contours. Aortic  atherosclerosis. No acute airspace disease, pleural effusion or pneumothorax. Multiple compression fractures in the thoracic spine, as seen on prior. No acute osseous abnormalities. IMPRESSION: Chronic hyperinflation and emphysema. No acute abnormality. Aortic Atherosclerosis (ICD10-I70.0) and Emphysema (ICD10-J43.9). Electronically Signed   By: Keith Rake M.D.   On: 04/24/2018 20:32   Dg Abd Portable 1v  Result Date: 04/27/2018 CLINICAL DATA:  Abdominal distension for a few months EXAM: PORTABLE ABDOMEN - 1 VIEW COMPARISON:  01/25/2018 FINDINGS: Scattered large and small bowel gas is noted. Mild retained fecal material is noted without obstructive change. Postsurgical changes are noted. Degenerative changes of the right hip joint are seen. Degenerative changes of the lumbar spine are noted stable from the prior exam. IMPRESSION: No acute abnormality noted. Electronically Signed   By: Inez Catalina M.D.   On: 04/27/2018 11:11  Subjective: Feels much better than yesterday.  Discharge Exam: Vitals:   04/27/18 1144 04/27/18 1200  BP:  115/71  Pulse: 99 92  Resp: 17   Temp:  97.8 F (36.6 C)  SpO2: 100% 98%   Vitals:   04/27/18 0448 04/27/18 0800 04/27/18 1144 04/27/18 1200  BP: (!) 145/88 134/83  115/71  Pulse: 81 72 99 92  Resp: 18 17 17    Temp: (!) 97.5 F (36.4 C) 98.1 F (36.7 C)  97.8 F (36.6 C)  TempSrc: Oral Oral  Oral  SpO2: 98% 99% 100% 98%  Weight: 48.6 kg     Height:        General: Pt is alert, awake, not in acute distress Cardiovascular: RRR, S1/S2 +, no rubs, no gallops Respiratory: Mild end-expiratory wheezing Abdominal: Soft, NT, ND, bowel sounds + Extremities: no edema, no cyanosis    The results of significant diagnostics from this hospitalization (including imaging, microbiology, ancillary and laboratory) are listed below for reference.     Microbiology: No results found for this or any previous visit (from the past 240 hour(s)).    Labs: BNP (last 3 results) Recent Labs    03/24/18 1820  BNP 31.4   Basic Metabolic Panel: Recent Labs  Lab 04/24/18 1954 04/27/18 0936  NA 139 137  K 3.1* 4.0  CL 99 101  CO2 28 25  GLUCOSE 130* 161*  BUN 23 19  CREATININE 0.73 0.64  CALCIUM 9.5 8.8*   Liver Function Tests: Recent Labs  Lab 04/24/18 1954  AST 28  ALT 24  ALKPHOS 77  BILITOT 0.9  PROT 6.5  ALBUMIN 3.9   No results for input(s): LIPASE, AMYLASE in the last 168 hours. No results for input(s): AMMONIA in the last 168 hours. CBC: Recent Labs  Lab 04/24/18 1954  WBC 7.5  NEUTROABS 5.9  HGB 13.2  HCT 39.7  MCV 94.7  PLT 291   Cardiac Enzymes: No results for input(s): CKTOTAL, CKMB, CKMBINDEX, TROPONINI in the last 168 hours. BNP: Invalid input(s): POCBNP CBG: No results for input(s): GLUCAP in the last 168 hours. D-Dimer No results for input(s): DDIMER in the last 72 hours. Hgb A1c No results for input(s): HGBA1C in the last 72 hours. Lipid Profile No results for input(s): CHOL, HDL, LDLCALC, TRIG, CHOLHDL, LDLDIRECT in the last 72 hours. Thyroid function studies No results for input(s): TSH, T4TOTAL, T3FREE, THYROIDAB in the last 72 hours.  Invalid input(s): FREET3 Anemia work up No results for input(s): VITAMINB12, FOLATE, FERRITIN, TIBC, IRON, RETICCTPCT in the last 72 hours. Urinalysis    Component Value Date/Time   COLORURINE YELLOW 04/25/2018 0329   APPEARANCEUR HAZY (A) 04/25/2018 0329   LABSPEC 1.014 04/25/2018 0329   PHURINE 5.0 04/25/2018 0329   GLUCOSEU NEGATIVE 04/25/2018 0329   HGBUR NEGATIVE 04/25/2018 0329   BILIRUBINUR NEGATIVE 04/25/2018 0329   KETONESUR NEGATIVE 04/25/2018 0329   PROTEINUR NEGATIVE 04/25/2018 0329   UROBILINOGEN 0.2 05/17/2013 1207   NITRITE NEGATIVE 04/25/2018 0329   LEUKOCYTESUR NEGATIVE 04/25/2018 0329     SIGNED:   Cordelia Poche, MD [facility] 04/27/2018, 2:22 PM

## 2018-04-27 NOTE — Progress Notes (Signed)
Home Oxygen ordered as requested; Jermaine with [facility] called; he is processing the order. A Portable oxygen tank will be delivered to the patient's bedside prior to discharging home and more oxygen tanks will be delivered to the patient home . Mindi [facility] [phone]

## 2018-04-27 NOTE — Progress Notes (Signed)
Patient complains of pain when sitting down, relieved when she stands.

## 2018-04-27 NOTE — Plan of Care (Signed)
  Problem: Education: Goal: Knowledge of disease or condition will improve Outcome: Progressing   Problem: Education: Goal: Knowledge of the prescribed therapeutic regimen will improve Outcome: Progressing

## 2018-04-28 DIAGNOSIS — M810 Age-related osteoporosis without current pathological fracture: Secondary | ICD-10-CM | POA: Diagnosis not present

## 2018-04-28 DIAGNOSIS — I110 Hypertensive heart disease with heart failure: Secondary | ICD-10-CM | POA: Diagnosis not present

## 2018-04-28 DIAGNOSIS — G629 Polyneuropathy, unspecified: Secondary | ICD-10-CM | POA: Diagnosis not present

## 2018-04-28 DIAGNOSIS — F419 Anxiety disorder, unspecified: Secondary | ICD-10-CM | POA: Diagnosis not present

## 2018-04-28 DIAGNOSIS — M1611 Unilateral primary osteoarthritis, right hip: Secondary | ICD-10-CM | POA: Diagnosis not present

## 2018-04-28 DIAGNOSIS — I480 Paroxysmal atrial fibrillation: Secondary | ICD-10-CM | POA: Diagnosis not present

## 2018-04-28 DIAGNOSIS — J439 Emphysema, unspecified: Secondary | ICD-10-CM | POA: Diagnosis not present

## 2018-04-28 DIAGNOSIS — M47816 Spondylosis without myelopathy or radiculopathy, lumbar region: Secondary | ICD-10-CM | POA: Diagnosis not present

## 2018-04-28 DIAGNOSIS — I5032 Chronic diastolic (congestive) heart failure: Secondary | ICD-10-CM | POA: Diagnosis not present

## 2018-04-30 DIAGNOSIS — J441 Chronic obstructive pulmonary disease with (acute) exacerbation: Secondary | ICD-10-CM | POA: Diagnosis not present

## 2018-04-30 DIAGNOSIS — J449 Chronic obstructive pulmonary disease, unspecified: Secondary | ICD-10-CM | POA: Diagnosis not present

## 2018-04-30 NOTE — Consult Note (Signed)
            [facility] Primary Care Navigator  04/30/2018  Heather [name] [birth year] [id number]   Attempt to seepatient at the bedside to identify possible discharge needs butshe wasalreadydischarged. Patient went home with home health services and home oxygen (while ambulating).   Per MD note,patientpresented to the ED with complaints of cough, shortness of breath, sore throat, fever, headache, body and muscle aches. (Influenza B infection, acute respiratory failure with hypoxia, COPD exacerbation, paroxysmal atrial fibrillation, severe protein-calorie malnutrition)  Primary care provider's office is listed as providing transition of care (TOC) follow-up.   Patient has discharge instruction to follow-up withprimary care provider in 1 week.   For additional questions please contact:  Edwena Felty A. Chemika Nightengale, BSN, RN-BC [facility] PRIMARY CARE Navigator Cell: [phone]

## 2018-05-03 DIAGNOSIS — J439 Emphysema, unspecified: Secondary | ICD-10-CM | POA: Diagnosis not present

## 2018-05-03 DIAGNOSIS — G629 Polyneuropathy, unspecified: Secondary | ICD-10-CM | POA: Diagnosis not present

## 2018-05-03 DIAGNOSIS — M1611 Unilateral primary osteoarthritis, right hip: Secondary | ICD-10-CM | POA: Diagnosis not present

## 2018-05-03 DIAGNOSIS — M810 Age-related osteoporosis without current pathological fracture: Secondary | ICD-10-CM | POA: Diagnosis not present

## 2018-05-03 DIAGNOSIS — F419 Anxiety disorder, unspecified: Secondary | ICD-10-CM | POA: Diagnosis not present

## 2018-05-03 DIAGNOSIS — M47816 Spondylosis without myelopathy or radiculopathy, lumbar region: Secondary | ICD-10-CM | POA: Diagnosis not present

## 2018-05-03 DIAGNOSIS — I110 Hypertensive heart disease with heart failure: Secondary | ICD-10-CM | POA: Diagnosis not present

## 2018-05-03 DIAGNOSIS — I5032 Chronic diastolic (congestive) heart failure: Secondary | ICD-10-CM | POA: Diagnosis not present

## 2018-05-03 DIAGNOSIS — I480 Paroxysmal atrial fibrillation: Secondary | ICD-10-CM | POA: Diagnosis not present

## 2018-05-05 DIAGNOSIS — J439 Emphysema, unspecified: Secondary | ICD-10-CM | POA: Diagnosis not present

## 2018-05-05 DIAGNOSIS — M47816 Spondylosis without myelopathy or radiculopathy, lumbar region: Secondary | ICD-10-CM | POA: Diagnosis not present

## 2018-05-05 DIAGNOSIS — M1611 Unilateral primary osteoarthritis, right hip: Secondary | ICD-10-CM | POA: Diagnosis not present

## 2018-05-05 DIAGNOSIS — F419 Anxiety disorder, unspecified: Secondary | ICD-10-CM | POA: Diagnosis not present

## 2018-05-05 DIAGNOSIS — I110 Hypertensive heart disease with heart failure: Secondary | ICD-10-CM | POA: Diagnosis not present

## 2018-05-05 DIAGNOSIS — I480 Paroxysmal atrial fibrillation: Secondary | ICD-10-CM | POA: Diagnosis not present

## 2018-05-05 DIAGNOSIS — M810 Age-related osteoporosis without current pathological fracture: Secondary | ICD-10-CM | POA: Diagnosis not present

## 2018-05-05 DIAGNOSIS — G629 Polyneuropathy, unspecified: Secondary | ICD-10-CM | POA: Diagnosis not present

## 2018-05-05 DIAGNOSIS — I5032 Chronic diastolic (congestive) heart failure: Secondary | ICD-10-CM | POA: Diagnosis not present

## 2018-05-06 DIAGNOSIS — J441 Chronic obstructive pulmonary disease with (acute) exacerbation: Secondary | ICD-10-CM | POA: Diagnosis not present

## 2018-05-06 DIAGNOSIS — I5032 Chronic diastolic (congestive) heart failure: Secondary | ICD-10-CM | POA: Diagnosis not present

## 2018-05-06 DIAGNOSIS — Z681 Body mass index (BMI) 19 or less, adult: Secondary | ICD-10-CM | POA: Diagnosis not present

## 2018-05-06 DIAGNOSIS — J111 Influenza due to unidentified influenza virus with other respiratory manifestations: Secondary | ICD-10-CM | POA: Diagnosis not present

## 2018-05-06 DIAGNOSIS — I5033 Acute on chronic diastolic (congestive) heart failure: Secondary | ICD-10-CM | POA: Insufficient documentation

## 2018-05-06 DIAGNOSIS — I480 Paroxysmal atrial fibrillation: Secondary | ICD-10-CM | POA: Diagnosis not present

## 2018-05-06 DIAGNOSIS — I10 Essential (primary) hypertension: Secondary | ICD-10-CM | POA: Diagnosis not present

## 2018-05-10 ENCOUNTER — Other Ambulatory Visit: Payer: Self-pay | Admitting: Internal Medicine

## 2018-05-11 ENCOUNTER — Telehealth: Payer: Self-pay | Admitting: Internal Medicine

## 2018-05-11 DIAGNOSIS — J441 Chronic obstructive pulmonary disease with (acute) exacerbation: Secondary | ICD-10-CM | POA: Diagnosis not present

## 2018-05-11 DIAGNOSIS — J449 Chronic obstructive pulmonary disease, unspecified: Secondary | ICD-10-CM | POA: Diagnosis not present

## 2018-05-11 NOTE — Telephone Encounter (Signed)
LMTCB

## 2018-05-11 NOTE — Telephone Encounter (Signed)
Heather [name], pts daughter on DPR, called to report that Dr. Reynaldo Minium at her D/C summary advised her to decrease her Losartan to 50mg  due to low BP... but she wanted [place] to call [employer] to make the change... I advised her that since Dr. Reynaldo Minium is managing her BP and making med changes it would be appropriate for his office to call the pharmacy with her new RX.Marland Kitchen She verbalized understanding and agreed. She declined making a follow up appt with [place] at this time.

## 2018-05-11 NOTE — Telephone Encounter (Signed)
New Message:      Please call, concerning pt's Losartan.

## 2018-05-12 DIAGNOSIS — J439 Emphysema, unspecified: Secondary | ICD-10-CM | POA: Diagnosis not present

## 2018-05-12 DIAGNOSIS — M47816 Spondylosis without myelopathy or radiculopathy, lumbar region: Secondary | ICD-10-CM | POA: Diagnosis not present

## 2018-05-12 DIAGNOSIS — M810 Age-related osteoporosis without current pathological fracture: Secondary | ICD-10-CM | POA: Diagnosis not present

## 2018-05-12 DIAGNOSIS — F419 Anxiety disorder, unspecified: Secondary | ICD-10-CM | POA: Diagnosis not present

## 2018-05-12 DIAGNOSIS — I480 Paroxysmal atrial fibrillation: Secondary | ICD-10-CM | POA: Diagnosis not present

## 2018-05-12 DIAGNOSIS — M1611 Unilateral primary osteoarthritis, right hip: Secondary | ICD-10-CM | POA: Diagnosis not present

## 2018-05-12 DIAGNOSIS — G629 Polyneuropathy, unspecified: Secondary | ICD-10-CM | POA: Diagnosis not present

## 2018-05-12 DIAGNOSIS — I5032 Chronic diastolic (congestive) heart failure: Secondary | ICD-10-CM | POA: Diagnosis not present

## 2018-05-12 DIAGNOSIS — I110 Hypertensive heart disease with heart failure: Secondary | ICD-10-CM | POA: Diagnosis not present

## 2018-05-14 DIAGNOSIS — F419 Anxiety disorder, unspecified: Secondary | ICD-10-CM | POA: Diagnosis not present

## 2018-05-14 DIAGNOSIS — M1611 Unilateral primary osteoarthritis, right hip: Secondary | ICD-10-CM | POA: Diagnosis not present

## 2018-05-14 DIAGNOSIS — G629 Polyneuropathy, unspecified: Secondary | ICD-10-CM | POA: Diagnosis not present

## 2018-05-14 DIAGNOSIS — I110 Hypertensive heart disease with heart failure: Secondary | ICD-10-CM | POA: Diagnosis not present

## 2018-05-14 DIAGNOSIS — I5032 Chronic diastolic (congestive) heart failure: Secondary | ICD-10-CM | POA: Diagnosis not present

## 2018-05-14 DIAGNOSIS — J439 Emphysema, unspecified: Secondary | ICD-10-CM | POA: Diagnosis not present

## 2018-05-14 DIAGNOSIS — M810 Age-related osteoporosis without current pathological fracture: Secondary | ICD-10-CM | POA: Diagnosis not present

## 2018-05-14 DIAGNOSIS — I480 Paroxysmal atrial fibrillation: Secondary | ICD-10-CM | POA: Diagnosis not present

## 2018-05-14 DIAGNOSIS — M47816 Spondylosis without myelopathy or radiculopathy, lumbar region: Secondary | ICD-10-CM | POA: Diagnosis not present

## 2018-05-17 DIAGNOSIS — I480 Paroxysmal atrial fibrillation: Secondary | ICD-10-CM | POA: Diagnosis not present

## 2018-05-17 DIAGNOSIS — M47816 Spondylosis without myelopathy or radiculopathy, lumbar region: Secondary | ICD-10-CM | POA: Diagnosis not present

## 2018-05-17 DIAGNOSIS — I110 Hypertensive heart disease with heart failure: Secondary | ICD-10-CM | POA: Diagnosis not present

## 2018-05-17 DIAGNOSIS — I5032 Chronic diastolic (congestive) heart failure: Secondary | ICD-10-CM | POA: Diagnosis not present

## 2018-05-17 DIAGNOSIS — M1611 Unilateral primary osteoarthritis, right hip: Secondary | ICD-10-CM | POA: Diagnosis not present

## 2018-05-17 DIAGNOSIS — F419 Anxiety disorder, unspecified: Secondary | ICD-10-CM | POA: Diagnosis not present

## 2018-05-17 DIAGNOSIS — G629 Polyneuropathy, unspecified: Secondary | ICD-10-CM | POA: Diagnosis not present

## 2018-05-17 DIAGNOSIS — M810 Age-related osteoporosis without current pathological fracture: Secondary | ICD-10-CM | POA: Diagnosis not present

## 2018-05-17 DIAGNOSIS — J439 Emphysema, unspecified: Secondary | ICD-10-CM | POA: Diagnosis not present

## 2018-05-19 DIAGNOSIS — M1611 Unilateral primary osteoarthritis, right hip: Secondary | ICD-10-CM | POA: Diagnosis not present

## 2018-05-19 DIAGNOSIS — G629 Polyneuropathy, unspecified: Secondary | ICD-10-CM | POA: Diagnosis not present

## 2018-05-19 DIAGNOSIS — M47816 Spondylosis without myelopathy or radiculopathy, lumbar region: Secondary | ICD-10-CM | POA: Diagnosis not present

## 2018-05-19 DIAGNOSIS — J439 Emphysema, unspecified: Secondary | ICD-10-CM | POA: Diagnosis not present

## 2018-05-19 DIAGNOSIS — M810 Age-related osteoporosis without current pathological fracture: Secondary | ICD-10-CM | POA: Diagnosis not present

## 2018-05-19 DIAGNOSIS — I5032 Chronic diastolic (congestive) heart failure: Secondary | ICD-10-CM | POA: Diagnosis not present

## 2018-05-19 DIAGNOSIS — F419 Anxiety disorder, unspecified: Secondary | ICD-10-CM | POA: Diagnosis not present

## 2018-05-19 DIAGNOSIS — I110 Hypertensive heart disease with heart failure: Secondary | ICD-10-CM | POA: Diagnosis not present

## 2018-05-19 DIAGNOSIS — I480 Paroxysmal atrial fibrillation: Secondary | ICD-10-CM | POA: Diagnosis not present

## 2018-05-29 DIAGNOSIS — J449 Chronic obstructive pulmonary disease, unspecified: Secondary | ICD-10-CM | POA: Diagnosis not present

## 2018-05-29 DIAGNOSIS — J441 Chronic obstructive pulmonary disease with (acute) exacerbation: Secondary | ICD-10-CM | POA: Diagnosis not present

## 2018-06-22 ENCOUNTER — Other Ambulatory Visit: Payer: Self-pay

## 2018-06-22 MED ORDER — APIXABAN 2.5 MG PO TABS: 2.5000 mg | ORAL_TABLET | Freq: Two times a day (BID) | ORAL | 1 refills | Status: DC

## 2018-06-22 NOTE — Telephone Encounter (Signed)
Received a fax from [facility] patient assistance requesting an updated Eliquis prescriptions specifying the amount of refills. Printed Rx completed and will fax once MD sign.

## 2018-06-24 NOTE — Telephone Encounter (Signed)
Signed Rx for Eliquis faxed to [facility] today 3/26.

## 2018-06-29 DIAGNOSIS — J441 Chronic obstructive pulmonary disease with (acute) exacerbation: Secondary | ICD-10-CM | POA: Diagnosis not present

## 2018-06-29 DIAGNOSIS — J449 Chronic obstructive pulmonary disease, unspecified: Secondary | ICD-10-CM | POA: Diagnosis not present

## 2018-07-01 ENCOUNTER — Telehealth: Payer: Self-pay | Admitting: Internal Medicine

## 2018-07-01 MED ORDER — APIXABAN 2.5 MG PO TABS: 2.5000 mg | ORAL_TABLET | Freq: Two times a day (BID) | ORAL | 5 refills | Status: DC

## 2018-07-01 NOTE — Telephone Encounter (Signed)
°*  STAT* If patient is at the pharmacy, call can be transferred to refill team.   1. Which medications need to be refilled? (please list name of each medication and dose if known) Eliquis 2.5   2. Which pharmacy/location (including street and city if local pharmacy) is medication to be sent to? [place]  3. Do they need a 30 day or 90 day supply? [place]

## 2018-07-05 DIAGNOSIS — I5032 Chronic diastolic (congestive) heart failure: Secondary | ICD-10-CM | POA: Diagnosis not present

## 2018-07-05 DIAGNOSIS — I110 Hypertensive heart disease with heart failure: Secondary | ICD-10-CM | POA: Diagnosis not present

## 2018-07-05 DIAGNOSIS — J449 Chronic obstructive pulmonary disease, unspecified: Secondary | ICD-10-CM | POA: Diagnosis not present

## 2018-07-05 DIAGNOSIS — R0609 Other forms of dyspnea: Secondary | ICD-10-CM | POA: Diagnosis not present

## 2018-07-05 DIAGNOSIS — I480 Paroxysmal atrial fibrillation: Secondary | ICD-10-CM | POA: Diagnosis not present

## 2018-07-05 DIAGNOSIS — R600 Localized edema: Secondary | ICD-10-CM | POA: Diagnosis not present

## 2018-07-12 DIAGNOSIS — R600 Localized edema: Secondary | ICD-10-CM | POA: Diagnosis not present

## 2018-07-12 DIAGNOSIS — J449 Chronic obstructive pulmonary disease, unspecified: Secondary | ICD-10-CM | POA: Diagnosis not present

## 2018-07-12 DIAGNOSIS — I480 Paroxysmal atrial fibrillation: Secondary | ICD-10-CM | POA: Diagnosis not present

## 2018-07-12 DIAGNOSIS — R0609 Other forms of dyspnea: Secondary | ICD-10-CM | POA: Diagnosis not present

## 2018-07-12 DIAGNOSIS — I110 Hypertensive heart disease with heart failure: Secondary | ICD-10-CM | POA: Diagnosis not present

## 2018-07-12 DIAGNOSIS — I5032 Chronic diastolic (congestive) heart failure: Secondary | ICD-10-CM | POA: Diagnosis not present

## 2018-07-12 DIAGNOSIS — I129 Hypertensive chronic kidney disease with stage 1 through stage 4 chronic kidney disease, or unspecified chronic kidney disease: Secondary | ICD-10-CM | POA: Diagnosis not present

## 2018-07-21 DIAGNOSIS — J441 Chronic obstructive pulmonary disease with (acute) exacerbation: Secondary | ICD-10-CM | POA: Diagnosis not present

## 2018-07-21 DIAGNOSIS — J449 Chronic obstructive pulmonary disease, unspecified: Secondary | ICD-10-CM | POA: Diagnosis not present

## 2018-07-29 DIAGNOSIS — J449 Chronic obstructive pulmonary disease, unspecified: Secondary | ICD-10-CM | POA: Diagnosis not present

## 2018-07-29 DIAGNOSIS — R600 Localized edema: Secondary | ICD-10-CM | POA: Diagnosis not present

## 2018-07-29 DIAGNOSIS — I110 Hypertensive heart disease with heart failure: Secondary | ICD-10-CM | POA: Diagnosis not present

## 2018-07-29 DIAGNOSIS — J189 Pneumonia, unspecified organism: Secondary | ICD-10-CM | POA: Diagnosis not present

## 2018-07-29 DIAGNOSIS — I8310 Varicose veins of unspecified lower extremity with inflammation: Secondary | ICD-10-CM | POA: Diagnosis not present

## 2018-07-29 DIAGNOSIS — J441 Chronic obstructive pulmonary disease with (acute) exacerbation: Secondary | ICD-10-CM | POA: Diagnosis not present

## 2018-07-29 DIAGNOSIS — I480 Paroxysmal atrial fibrillation: Secondary | ICD-10-CM | POA: Diagnosis not present

## 2018-07-29 DIAGNOSIS — I5032 Chronic diastolic (congestive) heart failure: Secondary | ICD-10-CM | POA: Diagnosis not present

## 2018-08-13 DIAGNOSIS — I480 Paroxysmal atrial fibrillation: Secondary | ICD-10-CM | POA: Diagnosis not present

## 2018-08-13 DIAGNOSIS — I5032 Chronic diastolic (congestive) heart failure: Secondary | ICD-10-CM | POA: Diagnosis not present

## 2018-08-13 DIAGNOSIS — I130 Hypertensive heart and chronic kidney disease with heart failure and stage 1 through stage 4 chronic kidney disease, or unspecified chronic kidney disease: Secondary | ICD-10-CM | POA: Diagnosis not present

## 2018-08-13 DIAGNOSIS — I700 Atherosclerosis of aorta: Secondary | ICD-10-CM | POA: Diagnosis not present

## 2018-08-13 DIAGNOSIS — J439 Emphysema, unspecified: Secondary | ICD-10-CM | POA: Diagnosis not present

## 2018-08-13 DIAGNOSIS — E039 Hypothyroidism, unspecified: Secondary | ICD-10-CM | POA: Diagnosis not present

## 2018-08-13 DIAGNOSIS — M1611 Unilateral primary osteoarthritis, right hip: Secondary | ICD-10-CM | POA: Diagnosis not present

## 2018-08-13 DIAGNOSIS — N189 Chronic kidney disease, unspecified: Secondary | ICD-10-CM | POA: Diagnosis not present

## 2018-08-13 DIAGNOSIS — M47816 Spondylosis without myelopathy or radiculopathy, lumbar region: Secondary | ICD-10-CM | POA: Diagnosis not present

## 2018-08-17 DIAGNOSIS — N189 Chronic kidney disease, unspecified: Secondary | ICD-10-CM | POA: Diagnosis not present

## 2018-08-17 DIAGNOSIS — M1611 Unilateral primary osteoarthritis, right hip: Secondary | ICD-10-CM | POA: Diagnosis not present

## 2018-08-17 DIAGNOSIS — I700 Atherosclerosis of aorta: Secondary | ICD-10-CM | POA: Diagnosis not present

## 2018-08-17 DIAGNOSIS — I5032 Chronic diastolic (congestive) heart failure: Secondary | ICD-10-CM | POA: Diagnosis not present

## 2018-08-17 DIAGNOSIS — E039 Hypothyroidism, unspecified: Secondary | ICD-10-CM | POA: Diagnosis not present

## 2018-08-17 DIAGNOSIS — M47816 Spondylosis without myelopathy or radiculopathy, lumbar region: Secondary | ICD-10-CM | POA: Diagnosis not present

## 2018-08-17 DIAGNOSIS — I480 Paroxysmal atrial fibrillation: Secondary | ICD-10-CM | POA: Diagnosis not present

## 2018-08-17 DIAGNOSIS — J439 Emphysema, unspecified: Secondary | ICD-10-CM | POA: Diagnosis not present

## 2018-08-17 DIAGNOSIS — I130 Hypertensive heart and chronic kidney disease with heart failure and stage 1 through stage 4 chronic kidney disease, or unspecified chronic kidney disease: Secondary | ICD-10-CM | POA: Diagnosis not present

## 2018-08-18 DIAGNOSIS — J449 Chronic obstructive pulmonary disease, unspecified: Secondary | ICD-10-CM | POA: Diagnosis not present

## 2018-08-18 DIAGNOSIS — J441 Chronic obstructive pulmonary disease with (acute) exacerbation: Secondary | ICD-10-CM | POA: Diagnosis not present

## 2018-08-19 DIAGNOSIS — E039 Hypothyroidism, unspecified: Secondary | ICD-10-CM | POA: Diagnosis not present

## 2018-08-19 DIAGNOSIS — I130 Hypertensive heart and chronic kidney disease with heart failure and stage 1 through stage 4 chronic kidney disease, or unspecified chronic kidney disease: Secondary | ICD-10-CM | POA: Diagnosis not present

## 2018-08-19 DIAGNOSIS — I5032 Chronic diastolic (congestive) heart failure: Secondary | ICD-10-CM | POA: Diagnosis not present

## 2018-08-19 DIAGNOSIS — M1611 Unilateral primary osteoarthritis, right hip: Secondary | ICD-10-CM | POA: Diagnosis not present

## 2018-08-19 DIAGNOSIS — I480 Paroxysmal atrial fibrillation: Secondary | ICD-10-CM | POA: Diagnosis not present

## 2018-08-19 DIAGNOSIS — N189 Chronic kidney disease, unspecified: Secondary | ICD-10-CM | POA: Diagnosis not present

## 2018-08-19 DIAGNOSIS — M47816 Spondylosis without myelopathy or radiculopathy, lumbar region: Secondary | ICD-10-CM | POA: Diagnosis not present

## 2018-08-19 DIAGNOSIS — J439 Emphysema, unspecified: Secondary | ICD-10-CM | POA: Diagnosis not present

## 2018-08-19 DIAGNOSIS — I700 Atherosclerosis of aorta: Secondary | ICD-10-CM | POA: Diagnosis not present

## 2018-08-24 DIAGNOSIS — M1611 Unilateral primary osteoarthritis, right hip: Secondary | ICD-10-CM | POA: Diagnosis not present

## 2018-08-24 DIAGNOSIS — I480 Paroxysmal atrial fibrillation: Secondary | ICD-10-CM | POA: Diagnosis not present

## 2018-08-24 DIAGNOSIS — M47816 Spondylosis without myelopathy or radiculopathy, lumbar region: Secondary | ICD-10-CM | POA: Diagnosis not present

## 2018-08-24 DIAGNOSIS — I700 Atherosclerosis of aorta: Secondary | ICD-10-CM | POA: Diagnosis not present

## 2018-08-24 DIAGNOSIS — N189 Chronic kidney disease, unspecified: Secondary | ICD-10-CM | POA: Diagnosis not present

## 2018-08-24 DIAGNOSIS — J439 Emphysema, unspecified: Secondary | ICD-10-CM | POA: Diagnosis not present

## 2018-08-24 DIAGNOSIS — I130 Hypertensive heart and chronic kidney disease with heart failure and stage 1 through stage 4 chronic kidney disease, or unspecified chronic kidney disease: Secondary | ICD-10-CM | POA: Diagnosis not present

## 2018-08-24 DIAGNOSIS — I5032 Chronic diastolic (congestive) heart failure: Secondary | ICD-10-CM | POA: Diagnosis not present

## 2018-08-24 DIAGNOSIS — E039 Hypothyroidism, unspecified: Secondary | ICD-10-CM | POA: Diagnosis not present

## 2018-08-26 DIAGNOSIS — J439 Emphysema, unspecified: Secondary | ICD-10-CM | POA: Diagnosis not present

## 2018-08-26 DIAGNOSIS — I480 Paroxysmal atrial fibrillation: Secondary | ICD-10-CM | POA: Diagnosis not present

## 2018-08-26 DIAGNOSIS — I5032 Chronic diastolic (congestive) heart failure: Secondary | ICD-10-CM | POA: Diagnosis not present

## 2018-08-26 DIAGNOSIS — M47816 Spondylosis without myelopathy or radiculopathy, lumbar region: Secondary | ICD-10-CM | POA: Diagnosis not present

## 2018-08-26 DIAGNOSIS — M1611 Unilateral primary osteoarthritis, right hip: Secondary | ICD-10-CM | POA: Diagnosis not present

## 2018-08-26 DIAGNOSIS — I130 Hypertensive heart and chronic kidney disease with heart failure and stage 1 through stage 4 chronic kidney disease, or unspecified chronic kidney disease: Secondary | ICD-10-CM | POA: Diagnosis not present

## 2018-08-26 DIAGNOSIS — N189 Chronic kidney disease, unspecified: Secondary | ICD-10-CM | POA: Diagnosis not present

## 2018-08-26 DIAGNOSIS — I700 Atherosclerosis of aorta: Secondary | ICD-10-CM | POA: Diagnosis not present

## 2018-08-26 DIAGNOSIS — E039 Hypothyroidism, unspecified: Secondary | ICD-10-CM | POA: Diagnosis not present

## 2018-08-29 DIAGNOSIS — J449 Chronic obstructive pulmonary disease, unspecified: Secondary | ICD-10-CM | POA: Diagnosis not present

## 2018-08-29 DIAGNOSIS — J441 Chronic obstructive pulmonary disease with (acute) exacerbation: Secondary | ICD-10-CM | POA: Diagnosis not present

## 2018-08-30 DIAGNOSIS — J439 Emphysema, unspecified: Secondary | ICD-10-CM | POA: Diagnosis not present

## 2018-08-30 DIAGNOSIS — E039 Hypothyroidism, unspecified: Secondary | ICD-10-CM | POA: Diagnosis not present

## 2018-08-30 DIAGNOSIS — N189 Chronic kidney disease, unspecified: Secondary | ICD-10-CM | POA: Diagnosis not present

## 2018-08-30 DIAGNOSIS — I700 Atherosclerosis of aorta: Secondary | ICD-10-CM | POA: Diagnosis not present

## 2018-08-30 DIAGNOSIS — I480 Paroxysmal atrial fibrillation: Secondary | ICD-10-CM | POA: Diagnosis not present

## 2018-08-30 DIAGNOSIS — M47816 Spondylosis without myelopathy or radiculopathy, lumbar region: Secondary | ICD-10-CM | POA: Diagnosis not present

## 2018-08-30 DIAGNOSIS — M1611 Unilateral primary osteoarthritis, right hip: Secondary | ICD-10-CM | POA: Diagnosis not present

## 2018-08-30 DIAGNOSIS — I130 Hypertensive heart and chronic kidney disease with heart failure and stage 1 through stage 4 chronic kidney disease, or unspecified chronic kidney disease: Secondary | ICD-10-CM | POA: Diagnosis not present

## 2018-08-30 DIAGNOSIS — I5032 Chronic diastolic (congestive) heart failure: Secondary | ICD-10-CM | POA: Diagnosis not present

## 2018-09-02 DIAGNOSIS — J439 Emphysema, unspecified: Secondary | ICD-10-CM | POA: Diagnosis not present

## 2018-09-02 DIAGNOSIS — I5032 Chronic diastolic (congestive) heart failure: Secondary | ICD-10-CM | POA: Diagnosis not present

## 2018-09-02 DIAGNOSIS — E039 Hypothyroidism, unspecified: Secondary | ICD-10-CM | POA: Diagnosis not present

## 2018-09-02 DIAGNOSIS — N189 Chronic kidney disease, unspecified: Secondary | ICD-10-CM | POA: Diagnosis not present

## 2018-09-02 DIAGNOSIS — I700 Atherosclerosis of aorta: Secondary | ICD-10-CM | POA: Diagnosis not present

## 2018-09-02 DIAGNOSIS — M1611 Unilateral primary osteoarthritis, right hip: Secondary | ICD-10-CM | POA: Diagnosis not present

## 2018-09-02 DIAGNOSIS — M47816 Spondylosis without myelopathy or radiculopathy, lumbar region: Secondary | ICD-10-CM | POA: Diagnosis not present

## 2018-09-02 DIAGNOSIS — I480 Paroxysmal atrial fibrillation: Secondary | ICD-10-CM | POA: Diagnosis not present

## 2018-09-02 DIAGNOSIS — I130 Hypertensive heart and chronic kidney disease with heart failure and stage 1 through stage 4 chronic kidney disease, or unspecified chronic kidney disease: Secondary | ICD-10-CM | POA: Diagnosis not present

## 2018-09-06 DIAGNOSIS — M1611 Unilateral primary osteoarthritis, right hip: Secondary | ICD-10-CM | POA: Diagnosis not present

## 2018-09-06 DIAGNOSIS — I700 Atherosclerosis of aorta: Secondary | ICD-10-CM | POA: Diagnosis not present

## 2018-09-06 DIAGNOSIS — M47816 Spondylosis without myelopathy or radiculopathy, lumbar region: Secondary | ICD-10-CM | POA: Diagnosis not present

## 2018-09-06 DIAGNOSIS — J439 Emphysema, unspecified: Secondary | ICD-10-CM | POA: Diagnosis not present

## 2018-09-06 DIAGNOSIS — I130 Hypertensive heart and chronic kidney disease with heart failure and stage 1 through stage 4 chronic kidney disease, or unspecified chronic kidney disease: Secondary | ICD-10-CM | POA: Diagnosis not present

## 2018-09-06 DIAGNOSIS — E039 Hypothyroidism, unspecified: Secondary | ICD-10-CM | POA: Diagnosis not present

## 2018-09-06 DIAGNOSIS — I5032 Chronic diastolic (congestive) heart failure: Secondary | ICD-10-CM | POA: Diagnosis not present

## 2018-09-06 DIAGNOSIS — I480 Paroxysmal atrial fibrillation: Secondary | ICD-10-CM | POA: Diagnosis not present

## 2018-09-06 DIAGNOSIS — N189 Chronic kidney disease, unspecified: Secondary | ICD-10-CM | POA: Diagnosis not present

## 2018-09-10 DIAGNOSIS — N189 Chronic kidney disease, unspecified: Secondary | ICD-10-CM | POA: Diagnosis not present

## 2018-09-10 DIAGNOSIS — I5032 Chronic diastolic (congestive) heart failure: Secondary | ICD-10-CM | POA: Diagnosis not present

## 2018-09-10 DIAGNOSIS — I700 Atherosclerosis of aorta: Secondary | ICD-10-CM | POA: Diagnosis not present

## 2018-09-10 DIAGNOSIS — J439 Emphysema, unspecified: Secondary | ICD-10-CM | POA: Diagnosis not present

## 2018-09-10 DIAGNOSIS — E039 Hypothyroidism, unspecified: Secondary | ICD-10-CM | POA: Diagnosis not present

## 2018-09-10 DIAGNOSIS — I130 Hypertensive heart and chronic kidney disease with heart failure and stage 1 through stage 4 chronic kidney disease, or unspecified chronic kidney disease: Secondary | ICD-10-CM | POA: Diagnosis not present

## 2018-09-10 DIAGNOSIS — I480 Paroxysmal atrial fibrillation: Secondary | ICD-10-CM | POA: Diagnosis not present

## 2018-09-10 DIAGNOSIS — M1611 Unilateral primary osteoarthritis, right hip: Secondary | ICD-10-CM | POA: Diagnosis not present

## 2018-09-10 DIAGNOSIS — M47816 Spondylosis without myelopathy or radiculopathy, lumbar region: Secondary | ICD-10-CM | POA: Diagnosis not present

## 2018-09-16 DIAGNOSIS — J439 Emphysema, unspecified: Secondary | ICD-10-CM | POA: Diagnosis not present

## 2018-09-16 DIAGNOSIS — M47816 Spondylosis without myelopathy or radiculopathy, lumbar region: Secondary | ICD-10-CM | POA: Diagnosis not present

## 2018-09-16 DIAGNOSIS — N189 Chronic kidney disease, unspecified: Secondary | ICD-10-CM | POA: Diagnosis not present

## 2018-09-16 DIAGNOSIS — I5032 Chronic diastolic (congestive) heart failure: Secondary | ICD-10-CM | POA: Diagnosis not present

## 2018-09-16 DIAGNOSIS — E039 Hypothyroidism, unspecified: Secondary | ICD-10-CM | POA: Diagnosis not present

## 2018-09-16 DIAGNOSIS — I130 Hypertensive heart and chronic kidney disease with heart failure and stage 1 through stage 4 chronic kidney disease, or unspecified chronic kidney disease: Secondary | ICD-10-CM | POA: Diagnosis not present

## 2018-09-16 DIAGNOSIS — I480 Paroxysmal atrial fibrillation: Secondary | ICD-10-CM | POA: Diagnosis not present

## 2018-09-16 DIAGNOSIS — M1611 Unilateral primary osteoarthritis, right hip: Secondary | ICD-10-CM | POA: Diagnosis not present

## 2018-09-16 DIAGNOSIS — I700 Atherosclerosis of aorta: Secondary | ICD-10-CM | POA: Diagnosis not present

## 2018-09-21 DIAGNOSIS — E039 Hypothyroidism, unspecified: Secondary | ICD-10-CM | POA: Diagnosis not present

## 2018-09-21 DIAGNOSIS — I130 Hypertensive heart and chronic kidney disease with heart failure and stage 1 through stage 4 chronic kidney disease, or unspecified chronic kidney disease: Secondary | ICD-10-CM | POA: Diagnosis not present

## 2018-09-21 DIAGNOSIS — I480 Paroxysmal atrial fibrillation: Secondary | ICD-10-CM | POA: Diagnosis not present

## 2018-09-21 DIAGNOSIS — M1611 Unilateral primary osteoarthritis, right hip: Secondary | ICD-10-CM | POA: Diagnosis not present

## 2018-09-21 DIAGNOSIS — J439 Emphysema, unspecified: Secondary | ICD-10-CM | POA: Diagnosis not present

## 2018-09-21 DIAGNOSIS — I5032 Chronic diastolic (congestive) heart failure: Secondary | ICD-10-CM | POA: Diagnosis not present

## 2018-09-21 DIAGNOSIS — I700 Atherosclerosis of aorta: Secondary | ICD-10-CM | POA: Diagnosis not present

## 2018-09-21 DIAGNOSIS — N189 Chronic kidney disease, unspecified: Secondary | ICD-10-CM | POA: Diagnosis not present

## 2018-09-21 DIAGNOSIS — M47816 Spondylosis without myelopathy or radiculopathy, lumbar region: Secondary | ICD-10-CM | POA: Diagnosis not present

## 2018-09-22 DIAGNOSIS — J449 Chronic obstructive pulmonary disease, unspecified: Secondary | ICD-10-CM | POA: Diagnosis not present

## 2018-09-22 DIAGNOSIS — J441 Chronic obstructive pulmonary disease with (acute) exacerbation: Secondary | ICD-10-CM | POA: Diagnosis not present

## 2018-09-27 DIAGNOSIS — I480 Paroxysmal atrial fibrillation: Secondary | ICD-10-CM | POA: Diagnosis not present

## 2018-09-27 DIAGNOSIS — M47816 Spondylosis without myelopathy or radiculopathy, lumbar region: Secondary | ICD-10-CM | POA: Diagnosis not present

## 2018-09-27 DIAGNOSIS — N189 Chronic kidney disease, unspecified: Secondary | ICD-10-CM | POA: Diagnosis not present

## 2018-09-27 DIAGNOSIS — J439 Emphysema, unspecified: Secondary | ICD-10-CM | POA: Diagnosis not present

## 2018-09-27 DIAGNOSIS — I130 Hypertensive heart and chronic kidney disease with heart failure and stage 1 through stage 4 chronic kidney disease, or unspecified chronic kidney disease: Secondary | ICD-10-CM | POA: Diagnosis not present

## 2018-09-27 DIAGNOSIS — I700 Atherosclerosis of aorta: Secondary | ICD-10-CM | POA: Diagnosis not present

## 2018-09-27 DIAGNOSIS — I5032 Chronic diastolic (congestive) heart failure: Secondary | ICD-10-CM | POA: Diagnosis not present

## 2018-09-27 DIAGNOSIS — E039 Hypothyroidism, unspecified: Secondary | ICD-10-CM | POA: Diagnosis not present

## 2018-09-27 DIAGNOSIS — M1611 Unilateral primary osteoarthritis, right hip: Secondary | ICD-10-CM | POA: Diagnosis not present

## 2018-09-28 DIAGNOSIS — J449 Chronic obstructive pulmonary disease, unspecified: Secondary | ICD-10-CM | POA: Diagnosis not present

## 2018-09-28 DIAGNOSIS — J441 Chronic obstructive pulmonary disease with (acute) exacerbation: Secondary | ICD-10-CM | POA: Diagnosis not present

## 2018-10-11 DIAGNOSIS — M1611 Unilateral primary osteoarthritis, right hip: Secondary | ICD-10-CM | POA: Diagnosis not present

## 2018-10-11 DIAGNOSIS — I700 Atherosclerosis of aorta: Secondary | ICD-10-CM | POA: Diagnosis not present

## 2018-10-11 DIAGNOSIS — M47816 Spondylosis without myelopathy or radiculopathy, lumbar region: Secondary | ICD-10-CM | POA: Diagnosis not present

## 2018-10-11 DIAGNOSIS — E039 Hypothyroidism, unspecified: Secondary | ICD-10-CM | POA: Diagnosis not present

## 2018-10-11 DIAGNOSIS — I5032 Chronic diastolic (congestive) heart failure: Secondary | ICD-10-CM | POA: Diagnosis not present

## 2018-10-11 DIAGNOSIS — I130 Hypertensive heart and chronic kidney disease with heart failure and stage 1 through stage 4 chronic kidney disease, or unspecified chronic kidney disease: Secondary | ICD-10-CM | POA: Diagnosis not present

## 2018-10-11 DIAGNOSIS — J439 Emphysema, unspecified: Secondary | ICD-10-CM | POA: Diagnosis not present

## 2018-10-11 DIAGNOSIS — I480 Paroxysmal atrial fibrillation: Secondary | ICD-10-CM | POA: Diagnosis not present

## 2018-10-11 DIAGNOSIS — N189 Chronic kidney disease, unspecified: Secondary | ICD-10-CM | POA: Diagnosis not present

## 2018-10-12 DIAGNOSIS — M47816 Spondylosis without myelopathy or radiculopathy, lumbar region: Secondary | ICD-10-CM | POA: Diagnosis not present

## 2018-10-12 DIAGNOSIS — N189 Chronic kidney disease, unspecified: Secondary | ICD-10-CM | POA: Diagnosis not present

## 2018-10-12 DIAGNOSIS — E039 Hypothyroidism, unspecified: Secondary | ICD-10-CM | POA: Diagnosis not present

## 2018-10-12 DIAGNOSIS — J439 Emphysema, unspecified: Secondary | ICD-10-CM | POA: Diagnosis not present

## 2018-10-12 DIAGNOSIS — I480 Paroxysmal atrial fibrillation: Secondary | ICD-10-CM | POA: Diagnosis not present

## 2018-10-12 DIAGNOSIS — M1611 Unilateral primary osteoarthritis, right hip: Secondary | ICD-10-CM | POA: Diagnosis not present

## 2018-10-12 DIAGNOSIS — I130 Hypertensive heart and chronic kidney disease with heart failure and stage 1 through stage 4 chronic kidney disease, or unspecified chronic kidney disease: Secondary | ICD-10-CM | POA: Diagnosis not present

## 2018-10-12 DIAGNOSIS — I700 Atherosclerosis of aorta: Secondary | ICD-10-CM | POA: Diagnosis not present

## 2018-10-12 DIAGNOSIS — I5032 Chronic diastolic (congestive) heart failure: Secondary | ICD-10-CM | POA: Diagnosis not present

## 2018-10-16 DIAGNOSIS — I700 Atherosclerosis of aorta: Secondary | ICD-10-CM | POA: Diagnosis not present

## 2018-10-16 DIAGNOSIS — M47816 Spondylosis without myelopathy or radiculopathy, lumbar region: Secondary | ICD-10-CM | POA: Diagnosis not present

## 2018-10-16 DIAGNOSIS — M1611 Unilateral primary osteoarthritis, right hip: Secondary | ICD-10-CM | POA: Diagnosis not present

## 2018-10-16 DIAGNOSIS — I130 Hypertensive heart and chronic kidney disease with heart failure and stage 1 through stage 4 chronic kidney disease, or unspecified chronic kidney disease: Secondary | ICD-10-CM | POA: Diagnosis not present

## 2018-10-16 DIAGNOSIS — I5032 Chronic diastolic (congestive) heart failure: Secondary | ICD-10-CM | POA: Diagnosis not present

## 2018-10-16 DIAGNOSIS — J439 Emphysema, unspecified: Secondary | ICD-10-CM | POA: Diagnosis not present

## 2018-10-16 DIAGNOSIS — I480 Paroxysmal atrial fibrillation: Secondary | ICD-10-CM | POA: Diagnosis not present

## 2018-10-16 DIAGNOSIS — E039 Hypothyroidism, unspecified: Secondary | ICD-10-CM | POA: Diagnosis not present

## 2018-10-16 DIAGNOSIS — N189 Chronic kidney disease, unspecified: Secondary | ICD-10-CM | POA: Diagnosis not present

## 2018-10-19 ENCOUNTER — Other Ambulatory Visit: Payer: Self-pay

## 2018-10-19 DIAGNOSIS — I130 Hypertensive heart and chronic kidney disease with heart failure and stage 1 through stage 4 chronic kidney disease, or unspecified chronic kidney disease: Secondary | ICD-10-CM | POA: Diagnosis not present

## 2018-10-19 DIAGNOSIS — J439 Emphysema, unspecified: Secondary | ICD-10-CM | POA: Diagnosis not present

## 2018-10-19 DIAGNOSIS — M1611 Unilateral primary osteoarthritis, right hip: Secondary | ICD-10-CM | POA: Diagnosis not present

## 2018-10-19 DIAGNOSIS — N189 Chronic kidney disease, unspecified: Secondary | ICD-10-CM | POA: Diagnosis not present

## 2018-10-19 DIAGNOSIS — L819 Disorder of pigmentation, unspecified: Secondary | ICD-10-CM

## 2018-10-19 DIAGNOSIS — I700 Atherosclerosis of aorta: Secondary | ICD-10-CM | POA: Diagnosis not present

## 2018-10-19 DIAGNOSIS — M47816 Spondylosis without myelopathy or radiculopathy, lumbar region: Secondary | ICD-10-CM | POA: Diagnosis not present

## 2018-10-19 DIAGNOSIS — I480 Paroxysmal atrial fibrillation: Secondary | ICD-10-CM | POA: Diagnosis not present

## 2018-10-19 DIAGNOSIS — E039 Hypothyroidism, unspecified: Secondary | ICD-10-CM | POA: Diagnosis not present

## 2018-10-19 DIAGNOSIS — I5032 Chronic diastolic (congestive) heart failure: Secondary | ICD-10-CM | POA: Diagnosis not present

## 2018-10-20 DIAGNOSIS — E039 Hypothyroidism, unspecified: Secondary | ICD-10-CM | POA: Diagnosis not present

## 2018-10-20 DIAGNOSIS — I77810 Thoracic aortic ectasia: Secondary | ICD-10-CM | POA: Diagnosis not present

## 2018-10-20 DIAGNOSIS — I8310 Varicose veins of unspecified lower extremity with inflammation: Secondary | ICD-10-CM | POA: Diagnosis not present

## 2018-10-20 DIAGNOSIS — I480 Paroxysmal atrial fibrillation: Secondary | ICD-10-CM | POA: Diagnosis not present

## 2018-10-20 DIAGNOSIS — R7301 Impaired fasting glucose: Secondary | ICD-10-CM | POA: Diagnosis not present

## 2018-10-20 DIAGNOSIS — R0902 Hypoxemia: Secondary | ICD-10-CM | POA: Diagnosis not present

## 2018-10-20 DIAGNOSIS — J449 Chronic obstructive pulmonary disease, unspecified: Secondary | ICD-10-CM | POA: Diagnosis not present

## 2018-10-20 DIAGNOSIS — I5032 Chronic diastolic (congestive) heart failure: Secondary | ICD-10-CM | POA: Diagnosis not present

## 2018-10-20 DIAGNOSIS — I130 Hypertensive heart and chronic kidney disease with heart failure and stage 1 through stage 4 chronic kidney disease, or unspecified chronic kidney disease: Secondary | ICD-10-CM | POA: Diagnosis not present

## 2018-10-21 DIAGNOSIS — J449 Chronic obstructive pulmonary disease, unspecified: Secondary | ICD-10-CM | POA: Diagnosis not present

## 2018-10-21 DIAGNOSIS — J441 Chronic obstructive pulmonary disease with (acute) exacerbation: Secondary | ICD-10-CM | POA: Diagnosis not present

## 2018-10-24 DIAGNOSIS — N189 Chronic kidney disease, unspecified: Secondary | ICD-10-CM | POA: Diagnosis not present

## 2018-10-24 DIAGNOSIS — J439 Emphysema, unspecified: Secondary | ICD-10-CM | POA: Diagnosis not present

## 2018-10-24 DIAGNOSIS — I700 Atherosclerosis of aorta: Secondary | ICD-10-CM | POA: Diagnosis not present

## 2018-10-24 DIAGNOSIS — I5032 Chronic diastolic (congestive) heart failure: Secondary | ICD-10-CM | POA: Diagnosis not present

## 2018-10-24 DIAGNOSIS — M1611 Unilateral primary osteoarthritis, right hip: Secondary | ICD-10-CM | POA: Diagnosis not present

## 2018-10-24 DIAGNOSIS — I130 Hypertensive heart and chronic kidney disease with heart failure and stage 1 through stage 4 chronic kidney disease, or unspecified chronic kidney disease: Secondary | ICD-10-CM | POA: Diagnosis not present

## 2018-10-24 DIAGNOSIS — M47816 Spondylosis without myelopathy or radiculopathy, lumbar region: Secondary | ICD-10-CM | POA: Diagnosis not present

## 2018-10-24 DIAGNOSIS — I480 Paroxysmal atrial fibrillation: Secondary | ICD-10-CM | POA: Diagnosis not present

## 2018-10-24 DIAGNOSIS — E039 Hypothyroidism, unspecified: Secondary | ICD-10-CM | POA: Diagnosis not present

## 2018-10-26 ENCOUNTER — Encounter: Payer: Self-pay | Admitting: Vascular Surgery

## 2018-10-26 ENCOUNTER — Ambulatory Visit (INDEPENDENT_AMBULATORY_CARE_PROVIDER_SITE_OTHER): Payer: Medicare HMO | Admitting: Vascular Surgery

## 2018-10-26 ENCOUNTER — Ambulatory Visit (HOSPITAL_COMMUNITY)
Admission: RE | Admit: 2018-10-26 | Discharge: 2018-10-26 | Disposition: A | Discharge status: Home / Self Care | Payer: Medicare HMO | Source: Ambulatory Visit | Attending: Family | Admitting: Family

## 2018-10-26 ENCOUNTER — Other Ambulatory Visit: Payer: Self-pay

## 2018-10-26 VITALS — BP 116/60 | HR 88 | Temp 98.1°F | Resp 18 | Ht 63.0 in | Wt 98.9 lb

## 2018-10-26 DIAGNOSIS — I739 Peripheral vascular disease, unspecified: Secondary | ICD-10-CM

## 2018-10-26 DIAGNOSIS — L819 Disorder of pigmentation, unspecified: Secondary | ICD-10-CM | POA: Insufficient documentation

## 2018-10-26 DIAGNOSIS — L97329 Non-pressure chronic ulcer of left ankle with unspecified severity: Secondary | ICD-10-CM | POA: Diagnosis not present

## 2018-10-26 DIAGNOSIS — I83023 Varicose veins of left lower extremity with ulcer of ankle: Secondary | ICD-10-CM

## 2018-10-26 NOTE — Progress Notes (Signed)
[facility] [place]  Patient name: Heather [name] MRN: [id number] DOB: [birth year] Sex: female  REASON FOR CONSULT: Evaluation lower extremity discoloration and ulceration  HPI: Heather [name] is a 85 y.o. female, who is here today for evaluation of lower extremity pain and discoloration.  She has had a history of prior venous ulcers long time healing on the right and superficial currently on the left.  She is seen today to rule out any critical ischemia of her lower extremities.  She is here today with her daughter.  She is frail.  She is able to walk with his assistance of a rolling walker.  She does have swelling and wore compression garments in the past but is switched to a lesser degree of compression sock.  Past Medical History:  Diagnosis Date  . Anxiety   . Arthritis   . Atrial fibrillation ([place])   . COPD (chronic obstructive pulmonary disease) ([place])   . Dysrhythmia    AF  . Essential hypertension, benign   . Gallstones   . GERD (gastroesophageal reflux disease)   . H/O hiatal hernia   . History of nuclear stress test 10/2010   dipyridamole; normal pattern of perfusion; low risk, normal study  . 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 Mild aortic insufficiency   . Osteoporosis   . Peripheral neuropathy   . Pneumonia    states she had it twice, last time a couple of months ago  . PONV (postoperative nausea and vomiting)   . Shortness of breath    with exertion  . Unspecified hypothyroidism   . Venous insufficiency     Family History  Problem Relation Age of Onset  . Stroke Mother   . Stroke Father   . Heart block Brother   . Bladder Cancer Brother   . Diabetes Brother   . Stroke Brother     SOCIAL HISTORY: Social History   Socioeconomic History  . Marital status: Married    Spouse name: Not on file  . Number of children: 2  . Years of education: Not on file  .  Highest education level: Not on file  Occupational History  . Occupation: Retired Administrator  . Financial resource strain: Not on file  . Food insecurity    Worry: Not on file    Inability: Not on file  . Transportation needs    Medical: Not on file    Non-medical: Not on file  Tobacco Use  . Smoking status: Former Smoker    Packs/day: 1.00    Years: 20.00    Pack years: 20.00    Types: Cigarettes    Quit date: 09/12/1978    Years since quitting: 40.1  . Smokeless tobacco: Never Used  Substance and Sexual Activity  . Alcohol use: Yes    Alcohol/week: 0.0 standard drinks    Comment: occ  . Drug use: No  . Sexual activity: Yes  Lifestyle  . Physical activity    Days per week: Not on file    Minutes per session: Not on file  . Stress: Not on file  Relationships  . Social Herbalist on phone: Not on file    Gets together: Not on file    Attends religious service: Not on file    Active member of club or organization: Not on file    Attends meetings of clubs or organizations: Not on file  Relationship status: Not on file  . Intimate partner violence    Fear of current or ex partner: Not on file    Emotionally abused: Not on file    Physically abused: Not on file    Forced sexual activity: Not on file  Other Topics Concern  . Not on file  Social History Narrative  . Not on file    Allergies  Allergen Reactions  . Ace Inhibitors Cough  . Sulfur Nausea And Vomiting    Current Outpatient Medications  Medication Sig Dispense Refill  . albuterol (PROVENTIL HFA;VENTOLIN HFA) 108 (90 BASE) MCG/ACT inhaler Inhale 2 puffs into the lungs every 6 (six) hours as needed for wheezing or shortness of breath. 1 Inhaler 2  . albuterol (PROVENTIL) (2.5 MG/3ML) 0.083% nebulizer solution Take 2.5 mg by nebulization every 6 (six) hours as needed for wheezing or shortness of breath.    Marland Kitchen apixaban (ELIQUIS) 2.5 MG TABS tablet Take 1 tablet (2.5 mg total) by mouth  2 (two) times daily. 60 tablet 5  . budesonide-formoterol (SYMBICORT) 160-4.5 MCG/ACT inhaler Inhale 2 puffs into the lungs 2 (two) times daily.     . calcium citrate-vitamin D (CITRACAL+D) 315-200 MG-UNIT per tablet Take 1 tablet by mouth daily.    . Carboxymethylcellulose Sodium (THERATEARS OP) Place 2 drops into both eyes 2 (two) times daily.    Marland Kitchen diltiazem (CARDIZEM CD) 180 MG 24 hr capsule Take 180 mg by mouth daily.    . feeding supplement, ENSURE ENLIVE, (ENSURE ENLIVE) LIQD Take 237 mLs by mouth 2 (two) times daily between meals. 60 Bottle 0  . furosemide (LASIX) 40 MG tablet Take 1 tablet (40 mg total) by mouth as needed. 60 tablet 3  . hydrochlorothiazide (HYDRODIURIL) 25 MG tablet Take 1 tablet (25 mg total) by mouth daily. 90 tablet 0  . Lactobacillus Reuteri (BIOGAIA PROBIOTIC PO) Take 1 capsule by mouth daily.    Marland Kitchen levothyroxine (SYNTHROID, LEVOTHROID) 137 MCG tablet Take 137 mcg by mouth daily before breakfast.     . losartan (COZAAR) 100 MG tablet TAKE 1 TABLET EVERY DAY (Patient taking differently: Take 50 mg by mouth daily. TAKE 1 TABLET EVERY DAY) 30 tablet 0  . Multiple Vitamins-Minerals (MULTIVITAMIN WITH MINERALS) tablet Take 1 tablet by mouth daily.    . polyethylene glycol (MIRALAX / GLYCOLAX) packet Take 17 g by mouth every other day.     . potassium chloride SA (K-DUR) 20 MEQ tablet TAKE 1 TABLET BY MOUTH ONCE DAILY WITH FUROSEMIDE    . Teriparatide, Recombinant, (FORTEO [place]) Inject 1 pen into the skin daily.    . furosemide (LASIX) 20 MG tablet TAKE 1 TABLET BY MOUTH ONCE DAILY WITH POTASSIUM     No current facility-administered medications for this visit.     REVIEW OF SYSTEMS:  [X]  denotes positive finding, [ ]  denotes negative finding Cardiac  Comments:  Chest pain or chest pressure:    Shortness of breath upon exertion: x   Short of breath when lying flat:    Irregular heart rhythm: x       Vascular    Pain in calf, thigh, or hip brought on by ambulation:     Pain in feet at night that wakes you up from your sleep:  x   Blood clot in your veins:    Leg swelling:  x       Pulmonary    Oxygen at home:    Productive cough:     Wheezing:  Neurologic    Sudden weakness in arms or legs:     Sudden numbness in arms or legs:     Sudden onset of difficulty speaking or slurred speech:    Temporary loss of vision in one eye:     Problems with dizziness:  x       Gastrointestinal    Blood in stool:     Vomited blood:         Genitourinary    Burning when urinating:     Blood in urine:        Psychiatric    Major depression:         Hematologic    Bleeding problems:    Problems with blood clotting too easily:        Skin    Rashes or ulcers:        Constitutional    Fever or chills:      PHYSICAL EXAM: Vitals:   10/26/18 1251  BP: 116/60  Pulse: 88  Resp: 18  Temp: 98.1 F (36.7 C)  TempSrc: Temporal  SpO2: 97%  Weight: 98 lb 14.4 oz (44.9 kg)  Height: 5\' 3"  (1.6 m)    GENERAL: The patient is a well-nourished female, in no acute distress. The vital signs are documented above. CARDIOVASCULAR: 2+ radial 2+ femoral and 2+ popliteal pulses bilaterally.  I do not palpate pedal pulses although she does have marked swelling.  She does have good Doppler flow in her posterior tibial and dorsalis pedis bilaterally.  Does have cyanosis bilaterally PULMONARY: There is good air exchange  ABDOMEN: Soft and non-tender  MUSCULOSKELETAL: There are no major deformities or cyanosis. NEUROLOGIC: No focal weakness or paresthesias are detected. SKIN: There are no ulcers or rashes noted.  Ulceration on left posterior calf PSYCHIATRIC: The patient has a normal affect.  DATA:  Noninvasive studies reveal noncompressible vessels making noninvasive studies unreliable.  She does have slightly diminished toe brachial index  MEDICAL ISSUES: Had long discussion with the patient and her daughter present.  I do not see any evidence of critical  limb ischemia.  She does have palpable popliteal pulses bilaterally with probable some calcification in her tibial vessels.  I would not recommend any invasive further evaluation.  Feel that her majority of her symptoms are related to edema and explained the critical importance of knee-high compression and elevation.  She was reassured with this discussion will see [place] again on an as-needed basis   Rosetta Posner, MD [facility] Tel [phone] Pager [phone]

## 2018-10-29 DIAGNOSIS — J441 Chronic obstructive pulmonary disease with (acute) exacerbation: Secondary | ICD-10-CM | POA: Diagnosis not present

## 2018-10-29 DIAGNOSIS — J449 Chronic obstructive pulmonary disease, unspecified: Secondary | ICD-10-CM | POA: Diagnosis not present

## 2018-11-02 DIAGNOSIS — N189 Chronic kidney disease, unspecified: Secondary | ICD-10-CM | POA: Diagnosis not present

## 2018-11-02 DIAGNOSIS — I130 Hypertensive heart and chronic kidney disease with heart failure and stage 1 through stage 4 chronic kidney disease, or unspecified chronic kidney disease: Secondary | ICD-10-CM | POA: Diagnosis not present

## 2018-11-02 DIAGNOSIS — E039 Hypothyroidism, unspecified: Secondary | ICD-10-CM | POA: Diagnosis not present

## 2018-11-02 DIAGNOSIS — M1611 Unilateral primary osteoarthritis, right hip: Secondary | ICD-10-CM | POA: Diagnosis not present

## 2018-11-02 DIAGNOSIS — J439 Emphysema, unspecified: Secondary | ICD-10-CM | POA: Diagnosis not present

## 2018-11-02 DIAGNOSIS — I5032 Chronic diastolic (congestive) heart failure: Secondary | ICD-10-CM | POA: Diagnosis not present

## 2018-11-02 DIAGNOSIS — I480 Paroxysmal atrial fibrillation: Secondary | ICD-10-CM | POA: Diagnosis not present

## 2018-11-02 DIAGNOSIS — I700 Atherosclerosis of aorta: Secondary | ICD-10-CM | POA: Diagnosis not present

## 2018-11-02 DIAGNOSIS — M47816 Spondylosis without myelopathy or radiculopathy, lumbar region: Secondary | ICD-10-CM | POA: Diagnosis not present

## 2018-11-04 DIAGNOSIS — I10 Essential (primary) hypertension: Secondary | ICD-10-CM | POA: Diagnosis not present

## 2018-11-09 DIAGNOSIS — N189 Chronic kidney disease, unspecified: Secondary | ICD-10-CM | POA: Diagnosis not present

## 2018-11-09 DIAGNOSIS — I5032 Chronic diastolic (congestive) heart failure: Secondary | ICD-10-CM | POA: Diagnosis not present

## 2018-11-09 DIAGNOSIS — J439 Emphysema, unspecified: Secondary | ICD-10-CM | POA: Diagnosis not present

## 2018-11-09 DIAGNOSIS — I480 Paroxysmal atrial fibrillation: Secondary | ICD-10-CM | POA: Diagnosis not present

## 2018-11-09 DIAGNOSIS — I700 Atherosclerosis of aorta: Secondary | ICD-10-CM | POA: Diagnosis not present

## 2018-11-09 DIAGNOSIS — I130 Hypertensive heart and chronic kidney disease with heart failure and stage 1 through stage 4 chronic kidney disease, or unspecified chronic kidney disease: Secondary | ICD-10-CM | POA: Diagnosis not present

## 2018-11-09 DIAGNOSIS — E039 Hypothyroidism, unspecified: Secondary | ICD-10-CM | POA: Diagnosis not present

## 2018-11-09 DIAGNOSIS — M1611 Unilateral primary osteoarthritis, right hip: Secondary | ICD-10-CM | POA: Diagnosis not present

## 2018-11-09 DIAGNOSIS — M47816 Spondylosis without myelopathy or radiculopathy, lumbar region: Secondary | ICD-10-CM | POA: Diagnosis not present

## 2018-11-15 DIAGNOSIS — I700 Atherosclerosis of aorta: Secondary | ICD-10-CM | POA: Diagnosis not present

## 2018-11-15 DIAGNOSIS — I480 Paroxysmal atrial fibrillation: Secondary | ICD-10-CM | POA: Diagnosis not present

## 2018-11-15 DIAGNOSIS — E039 Hypothyroidism, unspecified: Secondary | ICD-10-CM | POA: Diagnosis not present

## 2018-11-15 DIAGNOSIS — M47816 Spondylosis without myelopathy or radiculopathy, lumbar region: Secondary | ICD-10-CM | POA: Diagnosis not present

## 2018-11-15 DIAGNOSIS — J439 Emphysema, unspecified: Secondary | ICD-10-CM | POA: Diagnosis not present

## 2018-11-15 DIAGNOSIS — I130 Hypertensive heart and chronic kidney disease with heart failure and stage 1 through stage 4 chronic kidney disease, or unspecified chronic kidney disease: Secondary | ICD-10-CM | POA: Diagnosis not present

## 2018-11-15 DIAGNOSIS — N189 Chronic kidney disease, unspecified: Secondary | ICD-10-CM | POA: Diagnosis not present

## 2018-11-15 DIAGNOSIS — I5032 Chronic diastolic (congestive) heart failure: Secondary | ICD-10-CM | POA: Diagnosis not present

## 2018-11-15 DIAGNOSIS — M1611 Unilateral primary osteoarthritis, right hip: Secondary | ICD-10-CM | POA: Diagnosis not present

## 2018-11-16 ENCOUNTER — Other Ambulatory Visit: Payer: Self-pay

## 2018-11-22 DIAGNOSIS — I480 Paroxysmal atrial fibrillation: Secondary | ICD-10-CM | POA: Diagnosis not present

## 2018-11-22 DIAGNOSIS — M47816 Spondylosis without myelopathy or radiculopathy, lumbar region: Secondary | ICD-10-CM | POA: Diagnosis not present

## 2018-11-22 DIAGNOSIS — I5032 Chronic diastolic (congestive) heart failure: Secondary | ICD-10-CM | POA: Diagnosis not present

## 2018-11-22 DIAGNOSIS — I700 Atherosclerosis of aorta: Secondary | ICD-10-CM | POA: Diagnosis not present

## 2018-11-22 DIAGNOSIS — E039 Hypothyroidism, unspecified: Secondary | ICD-10-CM | POA: Diagnosis not present

## 2018-11-22 DIAGNOSIS — M1611 Unilateral primary osteoarthritis, right hip: Secondary | ICD-10-CM | POA: Diagnosis not present

## 2018-11-22 DIAGNOSIS — I130 Hypertensive heart and chronic kidney disease with heart failure and stage 1 through stage 4 chronic kidney disease, or unspecified chronic kidney disease: Secondary | ICD-10-CM | POA: Diagnosis not present

## 2018-11-22 DIAGNOSIS — J439 Emphysema, unspecified: Secondary | ICD-10-CM | POA: Diagnosis not present

## 2018-11-22 DIAGNOSIS — N189 Chronic kidney disease, unspecified: Secondary | ICD-10-CM | POA: Diagnosis not present

## 2018-11-29 DIAGNOSIS — J441 Chronic obstructive pulmonary disease with (acute) exacerbation: Secondary | ICD-10-CM | POA: Diagnosis not present

## 2018-11-29 DIAGNOSIS — J449 Chronic obstructive pulmonary disease, unspecified: Secondary | ICD-10-CM | POA: Diagnosis not present

## 2018-11-30 DIAGNOSIS — J439 Emphysema, unspecified: Secondary | ICD-10-CM | POA: Diagnosis not present

## 2018-11-30 DIAGNOSIS — E039 Hypothyroidism, unspecified: Secondary | ICD-10-CM | POA: Diagnosis not present

## 2018-11-30 DIAGNOSIS — I5032 Chronic diastolic (congestive) heart failure: Secondary | ICD-10-CM | POA: Diagnosis not present

## 2018-11-30 DIAGNOSIS — M47816 Spondylosis without myelopathy or radiculopathy, lumbar region: Secondary | ICD-10-CM | POA: Diagnosis not present

## 2018-11-30 DIAGNOSIS — M1611 Unilateral primary osteoarthritis, right hip: Secondary | ICD-10-CM | POA: Diagnosis not present

## 2018-11-30 DIAGNOSIS — I700 Atherosclerosis of aorta: Secondary | ICD-10-CM | POA: Diagnosis not present

## 2018-11-30 DIAGNOSIS — N189 Chronic kidney disease, unspecified: Secondary | ICD-10-CM | POA: Diagnosis not present

## 2018-11-30 DIAGNOSIS — I480 Paroxysmal atrial fibrillation: Secondary | ICD-10-CM | POA: Diagnosis not present

## 2018-11-30 DIAGNOSIS — I130 Hypertensive heart and chronic kidney disease with heart failure and stage 1 through stage 4 chronic kidney disease, or unspecified chronic kidney disease: Secondary | ICD-10-CM | POA: Diagnosis not present

## 2018-12-07 DIAGNOSIS — E039 Hypothyroidism, unspecified: Secondary | ICD-10-CM | POA: Diagnosis not present

## 2018-12-07 DIAGNOSIS — I5032 Chronic diastolic (congestive) heart failure: Secondary | ICD-10-CM | POA: Diagnosis not present

## 2018-12-07 DIAGNOSIS — J439 Emphysema, unspecified: Secondary | ICD-10-CM | POA: Diagnosis not present

## 2018-12-07 DIAGNOSIS — M47816 Spondylosis without myelopathy or radiculopathy, lumbar region: Secondary | ICD-10-CM | POA: Diagnosis not present

## 2018-12-07 DIAGNOSIS — N189 Chronic kidney disease, unspecified: Secondary | ICD-10-CM | POA: Diagnosis not present

## 2018-12-07 DIAGNOSIS — I480 Paroxysmal atrial fibrillation: Secondary | ICD-10-CM | POA: Diagnosis not present

## 2018-12-07 DIAGNOSIS — M1611 Unilateral primary osteoarthritis, right hip: Secondary | ICD-10-CM | POA: Diagnosis not present

## 2018-12-07 DIAGNOSIS — J441 Chronic obstructive pulmonary disease with (acute) exacerbation: Secondary | ICD-10-CM | POA: Diagnosis not present

## 2018-12-07 DIAGNOSIS — J449 Chronic obstructive pulmonary disease, unspecified: Secondary | ICD-10-CM | POA: Diagnosis not present

## 2018-12-07 DIAGNOSIS — I130 Hypertensive heart and chronic kidney disease with heart failure and stage 1 through stage 4 chronic kidney disease, or unspecified chronic kidney disease: Secondary | ICD-10-CM | POA: Diagnosis not present

## 2018-12-07 DIAGNOSIS — I700 Atherosclerosis of aorta: Secondary | ICD-10-CM | POA: Diagnosis not present

## 2018-12-14 DIAGNOSIS — I10 Essential (primary) hypertension: Secondary | ICD-10-CM | POA: Diagnosis not present

## 2018-12-14 DIAGNOSIS — Z23 Encounter for immunization: Secondary | ICD-10-CM | POA: Diagnosis not present

## 2018-12-29 DIAGNOSIS — J441 Chronic obstructive pulmonary disease with (acute) exacerbation: Secondary | ICD-10-CM | POA: Diagnosis not present

## 2018-12-29 DIAGNOSIS — J449 Chronic obstructive pulmonary disease, unspecified: Secondary | ICD-10-CM | POA: Diagnosis not present

## 2019-01-05 ENCOUNTER — Other Ambulatory Visit: Payer: Self-pay

## 2019-01-05 ENCOUNTER — Inpatient Hospital Stay (HOSPITAL_COMMUNITY)
Admission: EM | Admit: 2019-01-05 | Discharge: 2019-01-11 | DRG: 177 | Disposition: A | Discharge status: Home Health | Payer: Medicare HMO | Attending: Internal Medicine | Admitting: Internal Medicine

## 2019-01-05 ENCOUNTER — Emergency Department (HOSPITAL_COMMUNITY): Payer: Medicare HMO

## 2019-01-05 ENCOUNTER — Encounter (HOSPITAL_COMMUNITY): Payer: Self-pay | Admitting: Obstetrics and Gynecology

## 2019-01-05 DIAGNOSIS — J438 Other emphysema: Secondary | ICD-10-CM | POA: Diagnosis not present

## 2019-01-05 DIAGNOSIS — J1289 Other viral pneumonia: Secondary | ICD-10-CM | POA: Diagnosis present

## 2019-01-05 DIAGNOSIS — Z7952 Long term (current) use of systemic steroids: Secondary | ICD-10-CM | POA: Diagnosis not present

## 2019-01-05 DIAGNOSIS — E861 Hypovolemia: Secondary | ICD-10-CM | POA: Diagnosis not present

## 2019-01-05 DIAGNOSIS — U071 COVID-19: Secondary | ICD-10-CM | POA: Diagnosis not present

## 2019-01-05 DIAGNOSIS — E039 Hypothyroidism, unspecified: Secondary | ICD-10-CM | POA: Diagnosis present

## 2019-01-05 DIAGNOSIS — I959 Hypotension, unspecified: Secondary | ICD-10-CM | POA: Diagnosis present

## 2019-01-05 DIAGNOSIS — I9589 Other hypotension: Secondary | ICD-10-CM

## 2019-01-05 DIAGNOSIS — J439 Emphysema, unspecified: Secondary | ICD-10-CM | POA: Diagnosis present

## 2019-01-05 DIAGNOSIS — K439 Ventral hernia without obstruction or gangrene: Secondary | ICD-10-CM | POA: Diagnosis present

## 2019-01-05 DIAGNOSIS — Z681 Body mass index (BMI) 19 or less, adult: Secondary | ICD-10-CM | POA: Diagnosis not present

## 2019-01-05 DIAGNOSIS — H9190 Unspecified hearing loss, unspecified ear: Secondary | ICD-10-CM | POA: Diagnosis present

## 2019-01-05 DIAGNOSIS — I5032 Chronic diastolic (congestive) heart failure: Secondary | ICD-10-CM | POA: Diagnosis not present

## 2019-01-05 DIAGNOSIS — I480 Paroxysmal atrial fibrillation: Secondary | ICD-10-CM | POA: Diagnosis not present

## 2019-01-05 DIAGNOSIS — E869 Volume depletion, unspecified: Secondary | ICD-10-CM | POA: Diagnosis present

## 2019-01-05 DIAGNOSIS — R0600 Dyspnea, unspecified: Secondary | ICD-10-CM | POA: Diagnosis not present

## 2019-01-05 DIAGNOSIS — R0602 Shortness of breath: Secondary | ICD-10-CM | POA: Diagnosis not present

## 2019-01-05 DIAGNOSIS — I4891 Unspecified atrial fibrillation: Secondary | ICD-10-CM | POA: Diagnosis present

## 2019-01-05 DIAGNOSIS — Z7901 Long term (current) use of anticoagulants: Secondary | ICD-10-CM | POA: Diagnosis not present

## 2019-01-05 DIAGNOSIS — Z8679 Personal history of other diseases of the circulatory system: Secondary | ICD-10-CM | POA: Diagnosis not present

## 2019-01-05 DIAGNOSIS — Z823 Family history of stroke: Secondary | ICD-10-CM

## 2019-01-05 DIAGNOSIS — Z79890 Hormone replacement therapy (postmenopausal): Secondary | ICD-10-CM

## 2019-01-05 DIAGNOSIS — J449 Chronic obstructive pulmonary disease, unspecified: Secondary | ICD-10-CM

## 2019-01-05 DIAGNOSIS — R0902 Hypoxemia: Secondary | ICD-10-CM | POA: Diagnosis not present

## 2019-01-05 DIAGNOSIS — I110 Hypertensive heart disease with heart failure: Secondary | ICD-10-CM | POA: Diagnosis not present

## 2019-01-05 DIAGNOSIS — J9601 Acute respiratory failure with hypoxia: Secondary | ICD-10-CM | POA: Diagnosis not present

## 2019-01-05 DIAGNOSIS — J9811 Atelectasis: Secondary | ICD-10-CM | POA: Diagnosis not present

## 2019-01-05 DIAGNOSIS — Z961 Presence of intraocular lens: Secondary | ICD-10-CM | POA: Diagnosis present

## 2019-01-05 DIAGNOSIS — Z9842 Cataract extraction status, left eye: Secondary | ICD-10-CM

## 2019-01-05 DIAGNOSIS — J441 Chronic obstructive pulmonary disease with (acute) exacerbation: Secondary | ICD-10-CM

## 2019-01-05 DIAGNOSIS — Z9841 Cataract extraction status, right eye: Secondary | ICD-10-CM

## 2019-01-05 DIAGNOSIS — R509 Fever, unspecified: Secondary | ICD-10-CM | POA: Diagnosis not present

## 2019-01-05 DIAGNOSIS — I10 Essential (primary) hypertension: Secondary | ICD-10-CM | POA: Diagnosis present

## 2019-01-05 DIAGNOSIS — J1282 Pneumonia due to coronavirus disease 2019: Secondary | ICD-10-CM

## 2019-01-05 DIAGNOSIS — E43 Unspecified severe protein-calorie malnutrition: Secondary | ICD-10-CM | POA: Diagnosis not present

## 2019-01-05 DIAGNOSIS — Z79899 Other long term (current) drug therapy: Secondary | ICD-10-CM

## 2019-01-05 LAB — COMPREHENSIVE METABOLIC PANEL
ALT: 27 U/L (ref 0–44)
AST: 30 U/L (ref 15–41)
Albumin: 3.9 g/dL (ref 3.5–5.0)
Alkaline Phosphatase: 82 U/L (ref 38–126)
Anion gap: 12 (ref 5–15)
BUN: 28 mg/dL — ABNORMAL HIGH (ref 8–23)
CO2: 28 mmol/L (ref 22–32)
Calcium: 9.3 mg/dL (ref 8.9–10.3)
Chloride: 96 mmol/L — ABNORMAL LOW (ref 98–111)
Creatinine, Ser: 0.89 mg/dL (ref 0.44–1.00)
GFR calc Af Amer: >60 mL/min (ref >60)
GFR calc non Af Amer: 57 mL/min — ABNORMAL LOW (ref >60)
Glucose, Bld: 105 mg/dL — ABNORMAL HIGH (ref 70–99)
Potassium: 3.7 mmol/L (ref 3.5–5.1)
Sodium: 136 mmol/L (ref 135–145)
Total Bilirubin: 0.7 mg/dL (ref 0.3–1.2)
Total Protein: 6.5 g/dL (ref 6.5–8.1)

## 2019-01-05 LAB — CBC WITH DIFFERENTIAL/PLATELET
Abs Immature Granulocytes: 0.03 10*3/uL (ref 0.00–0.07)
Basophils Absolute: 0 10*3/uL (ref 0.0–0.1)
Basophils Relative: 0 %
Eosinophils Absolute: 0 10*3/uL (ref 0.0–0.5)
Eosinophils Relative: 1 %
HCT: 43.9 % (ref 36.0–46.0)
Hemoglobin: 14.4 g/dL (ref 12.0–15.0)
Immature Granulocytes: 1 %
Lymphocytes Relative: 28 %
Lymphs Abs: 1.4 10*3/uL (ref 0.7–4.0)
MCH: 31 pg (ref 26.0–34.0)
MCHC: 32.8 g/dL (ref 30.0–36.0)
MCV: 94.4 fL (ref 80.0–100.0)
Monocytes Absolute: 0.4 10*3/uL (ref 0.1–1.0)
Monocytes Relative: 7 %
Neutro Abs: 3.2 10*3/uL (ref 1.7–7.7)
Neutrophils Relative %: 63 %
Platelets: 219 10*3/uL (ref 150–400)
RBC: 4.65 MIL/uL (ref 3.87–5.11)
RDW: 14.6 % (ref 11.5–15.5)
WBC: 5 10*3/uL (ref 4.0–10.5)
nRBC: 0 % (ref 0.0–0.2)

## 2019-01-05 LAB — ABO/RH: ABO/RH(D): B POS

## 2019-01-05 LAB — TROPONIN I (HIGH SENSITIVITY): Troponin I (High Sensitivity): 10 ng/L (ref <18)

## 2019-01-05 LAB — SARS CORONAVIRUS 2 BY RT PCR (HOSPITAL ORDER, PERFORMED IN ~~LOC~~ HOSPITAL LAB): SARS Coronavirus 2: POSITIVE — AB

## 2019-01-05 MED ORDER — DOXYCYCLINE HYCLATE 100 MG PO TABS
100.0000 mg | ORAL_TABLET | Freq: Once | ORAL | Status: AC
  Administered 2019-01-05: 17:00:00 100 mg via ORAL
  Filled 2019-01-05: qty 1

## 2019-01-05 MED ORDER — KETOROLAC TROMETHAMINE 30 MG/ML IJ SOLN
15.0000 mg | Freq: Once | INTRAMUSCULAR | Status: AC
  Administered 2019-01-05: 15 mg via INTRAVENOUS
  Filled 2019-01-05: qty 1

## 2019-01-05 MED ORDER — PREDNISONE 20 MG PO TABS: ORAL_TABLET | ORAL | 0 refills | Status: DC

## 2019-01-05 MED ORDER — DILTIAZEM HCL ER COATED BEADS 180 MG PO CP24
180.0000 mg | ORAL_CAPSULE | Freq: Every day | ORAL | Status: DC
  Administered 2019-01-06 – 2019-01-11 (×6): 180 mg via ORAL
  Filled 2019-01-05 (×6): qty 1

## 2019-01-05 MED ORDER — ACETAMINOPHEN 325 MG PO TABS
650.0000 mg | ORAL_TABLET | Freq: Four times a day (QID) | ORAL | Status: DC | PRN
  Administered 2019-01-07: 650 mg via ORAL
  Filled 2019-01-05 (×2): qty 2

## 2019-01-05 MED ORDER — LEVOTHYROXINE SODIUM 137 MCG PO TABS
137.0000 ug | ORAL_TABLET | Freq: Every day | ORAL | Status: DC
  Administered 2019-01-06 – 2019-01-11 (×6): 137 ug via ORAL
  Filled 2019-01-05 (×8): qty 1

## 2019-01-05 MED ORDER — SODIUM CHLORIDE 0.9 % IV SOLN
INTRAVENOUS | Status: DC
  Administered 2019-01-06 (×2): via INTRAVENOUS

## 2019-01-05 MED ORDER — PREDNISONE 20 MG PO TABS
60.0000 mg | ORAL_TABLET | Freq: Once | ORAL | Status: AC
  Administered 2019-01-05: 17:00:00 60 mg via ORAL
  Filled 2019-01-05: qty 3

## 2019-01-05 MED ORDER — SODIUM CHLORIDE 0.9 % IV BOLUS
1000.0000 mL | Freq: Once | INTRAVENOUS | Status: AC
  Administered 2019-01-05: 1000 mL via INTRAVENOUS

## 2019-01-05 MED ORDER — DOXYCYCLINE HYCLATE 100 MG PO CAPS: 100.0000 mg | ORAL_CAPSULE | Freq: Two times a day (BID) | ORAL | 0 refills | Status: DC

## 2019-01-05 MED ORDER — MOMETASONE FURO-FORMOTEROL FUM 200-5 MCG/ACT IN AERO
2.0000 | INHALATION_SPRAY | Freq: Two times a day (BID) | RESPIRATORY_TRACT | Status: DC
  Administered 2019-01-06 – 2019-01-11 (×11): 2 via RESPIRATORY_TRACT
  Filled 2019-01-05 (×2): qty 8.8

## 2019-01-05 MED ORDER — ACETAMINOPHEN 500 MG PO TABS
1000.0000 mg | ORAL_TABLET | Freq: Once | ORAL | Status: AC
  Administered 2019-01-05: 1000 mg via ORAL
  Filled 2019-01-05: qty 2

## 2019-01-05 MED ORDER — HYDROCOD POLST-CPM POLST ER 10-8 MG/5ML PO SUER
5.0000 mL | Freq: Once | ORAL | Status: AC
  Administered 2019-01-05: 5 mL via ORAL
  Filled 2019-01-05: qty 5

## 2019-01-05 MED ORDER — ALBUTEROL SULFATE (2.5 MG/3ML) 0.083% IN NEBU
2.5000 mg | INHALATION_SOLUTION | Freq: Four times a day (QID) | RESPIRATORY_TRACT | Status: DC | PRN
  Filled 2019-01-05: qty 3

## 2019-01-05 MED ORDER — SODIUM CHLORIDE 0.9 % IV SOLN: Freq: Once | INTRAVENOUS | Status: DC

## 2019-01-05 MED ORDER — APIXABAN 2.5 MG PO TABS
2.5000 mg | ORAL_TABLET | Freq: Two times a day (BID) | ORAL | Status: DC
  Administered 2019-01-06 – 2019-01-11 (×12): 2.5 mg via ORAL
  Filled 2019-01-05 (×14): qty 1

## 2019-01-05 NOTE — ED Notes (Signed)
PPE and [id number] isolation sign outside room door

## 2019-01-05 NOTE — ED Triage Notes (Signed)
Patient presents from her MD's office. Patient reportedly had weakness and low O2 at the office.  Patient is alert and oriented x4. Patient is on Room air and at 96%.

## 2019-01-05 NOTE — H&P (Signed)
History and Physical    Heather [name] UXN:[id number] DOB: 1926-06-29 DOA: 01/05/2019  PCP: Burnard Bunting, MD  Patient coming from: Home  I have personally briefly reviewed patient's old medical records in [place]  Chief Complaint: Fatigue and fever  HPI: Heather [name] is a 85 y.o. female with medical history significant of paroxysmal atrial fibrillation on Eliquis, COPD, hypertension who presented with concerns of fever and increasing fatigue.  Daughter at bedside provides most of history as patient was hard of hearing.  She [place] the [place] and then starting this past Sunday about 3 days ago she began to noticed elevated temperature, fatigue, sore throat, runny nose and muscle aches.  She normally has shortness of breath worse with speaking at baseline due to her COPD.  She feels this is no worse.  However has had increased cough with sputum production.  She presented to her PCP today for evaluation and was found to be hypoxic and was sent to be evaluated in the ED. Patient denies any chest pain, palpitation.  Denies any nausea, vomiting, diarrhea or abdominal pain.  She endorses chronic erythema and edema of both of her feet with no acute worsening.  ED Course: Patient was afebrile and hypotensive down to 90 over 50s.  Patient had oxygen saturation of about 92 to 93% at rest but would have desaturations down to 88% with ambulation.  She is otherwise comfortable on room air.  Her blood pressure did improve after 2 L of normal saline fluid up to 114/60 at the time of my evaluation.  CBC shows no leukocytosis or anemia. CMP shows stable creatinine and otherwise unremarkable.  Troponin of 10. CXR with no acute cardiopulmonary findings.  Patient was initially treated by EDP with doxycycline and steroids for COPD exacerbation.  Initially had plans to discharge home but developed hypotension and COVID testing returned positive necessitating admission by [facility]  hospitalist.  Review of Systems:  Constitutional: No Weight Change,+ Fever ENT/Mouth: + sore throat, + Rhinorrhea Eyes: No Eye Pain, No Vision Changes Cardiovascular: No Chest Pain, no SOB,No Palpitations Respiratory: + Cough, +Sputum, No Wheezing, no Dyspnea  Gastrointestinal: No Nausea, No Vomiting, No Diarrhea, No Constipation, No Pain Genitourinary: no Urinary Incontinence, No Urgency, No Flank Pain Musculoskeletal: No Arthralgias, + Myalgias Skin: No Skin Lesions, No Pruritus, Neuro: no Weakness, No Numbness,  No Loss of Consciousness, No Syncope Psych: No Anxiety/Panic, No Depression, no decrease appetite Heme/Lymph: No Bruising, No Bleeding  Past Medical History:  Diagnosis Date  . Anxiety   . Arthritis   . Atrial fibrillation ([place])   . COPD (chronic obstructive pulmonary disease) ([place])   . Dysrhythmia    AF  . Essential hypertension, benign   . Gallstones   . GERD (gastroesophageal reflux disease)   . H/O hiatal hernia   . History of nuclear stress test 10/2010   dipyridamole; normal pattern of perfusion; low risk, normal study  . 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 Mild aortic insufficiency   . Osteoporosis   . Peripheral neuropathy   . Pneumonia    states she had it twice, last time a couple of months ago  . PONV (postoperative nausea and vomiting)   . Shortness of breath    with exertion  . Unspecified hypothyroidism   . Venous insufficiency     Past Surgical History:  Procedure Laterality Date  . APPENDECTOMY  1935  . BACK SURGERY     x2  .  Cardiometablic Testing  9/98/3382   submaximal effort with peak RER of 0.5, peak VO2 79% predicted; HR peak up to 78%; PVC was 55% predicted, PEV1 39% predicted; PEV1/VC ratio was reduced; normal vital capacity; DLCO was reduced to 63%  . CARPAL TUNNEL RELEASE    . CATARACT EXTRACTION W/ INTRAOCULAR LENS  IMPLANT, BILATERAL    . CHOLECYSTECTOMY  04/07/2014   dr toth  .  CHOLECYSTECTOMY N/A 04/07/2014   Procedure: LAPAROSCOPIC CHOLECYSTECTOMY WITH INTRAOPERATIVE CHOLANGIOGRAM POSSBILE OPEN;  Surgeon: Autumn Messing III, MD;  Location: [place];  Service: General;  Laterality: N/A;  . COLON SURGERY  2008   partial  . ESOPHAGOGASTRODUODENOSCOPY (EGD) WITH PROPOFOL N/A 05/25/2017   Procedure: ESOPHAGOGASTRODUODENOSCOPY (EGD) WITH PROPOFOL;  Surgeon: Clarene Essex, MD;  Location: [place];  Service: Endoscopy;  Laterality: N/A;  . HERNIA REPAIR    . JOINT REPLACEMENT    . left hip relacement  2000  . SAVORY DILATION N/A 05/25/2017   Procedure: SAVORY DILATION;  Surgeon: Clarene Essex, MD;  Location: [facility] ENDOSCOPY;  Service: Endoscopy;  Laterality: N/A;  . TONSILLECTOMY  1935  . TOTAL ABDOMINAL HYSTERECTOMY  1970  . TRANSTHORACIC ECHOCARDIOGRAM  05/2011   EF=>55%; mild conc LVH; mild mitral annular calcif; mild TR; AV mildly sclerotic & mild AR  . VENTRAL HERNIA REPAIR  09/19/2011   Procedure: LAPAROSCOPIC VENTRAL HERNIA;  Surgeon: Merrie Roof, MD;  Location: [place];  Service: General;  Laterality: N/A;  laparoscopic ventral hernia repair with mesh     reports that she quit smoking about 40 years ago. Her smoking use included cigarettes. She has a 20.00 pack-year smoking history. She has never used smokeless tobacco. She reports current alcohol use. She reports that she does not use drugs.  Allergies  Allergen Reactions  . Ace Inhibitors Cough  . Sulfur Nausea And Vomiting    Family History  Problem Relation Age of Onset  . Stroke Mother   . Stroke Father   . Heart block Brother   . Bladder Cancer Brother   . Diabetes Brother   . Stroke Brother     Family history reviewed and not pertinent   Prior to Admission medications   Medication Sig Start Date End Date Taking? Authorizing Provider  albuterol (PROVENTIL HFA;VENTOLIN HFA) 108 (90 BASE) MCG/ACT inhaler Inhale 2 puffs into the lungs every 6 (six) hours as needed for wheezing or shortness of breath. 05/23/13    Burnard Bunting, MD  albuterol (PROVENTIL) (2.5 MG/3ML) 0.083% nebulizer solution Take 2.5 mg by nebulization every 6 (six) hours as needed for wheezing or shortness of breath.    [provider]  apixaban (ELIQUIS) 2.5 MG TABS tablet Take 1 tablet (2.5 mg total) by mouth 2 (two) times daily. 07/01/18   Hilty, Nadean Corwin, MD  budesonide-formoterol (SYMBICORT) 160-4.5 MCG/ACT inhaler Inhale 2 puffs into the lungs 2 (two) times daily.     [provider]  calcium citrate-vitamin D (CITRACAL+D) 315-200 MG-UNIT per tablet Take 1 tablet by mouth daily.    [provider]  Carboxymethylcellulose Sodium (THERATEARS OP) Place 2 drops into both eyes 2 (two) times daily.    [provider]  diltiazem (CARDIZEM CD) 180 MG 24 hr capsule Take 180 mg by mouth daily.    [provider]  doxycycline (VIBRAMYCIN) 100 MG capsule Take 1 capsule (100 mg total) by mouth 2 (two) times daily. One po bid x 7 days 01/05/19   Deno Etienne, DO  feeding supplement, ENSURE ENLIVE, (  ENSURE ENLIVE) LIQD Take 237 mLs by mouth 2 (two) times daily between meals. 04/27/18   Mariel Aloe, MD  furosemide (LASIX) 20 MG tablet TAKE 1 TABLET BY MOUTH ONCE DAILY WITH POTASSIUM 10/21/18   [provider]  furosemide (LASIX) 40 MG tablet Take 1 tablet (40 mg total) by mouth as needed. 07/15/17   Almyra Deforest, PA  hydrochlorothiazide (HYDRODIURIL) 25 MG tablet Take 1 tablet (25 mg total) by mouth daily. 03/17/18   Hilty, Nadean Corwin, MD  Lactobacillus Reuteri (BIOGAIA PROBIOTIC PO) Take 1 capsule by mouth daily.    [provider]  levothyroxine (SYNTHROID, LEVOTHROID) 137 MCG tablet Take 137 mcg by mouth daily before breakfast.     [provider]  losartan (COZAAR) 100 MG tablet TAKE 1 TABLET EVERY DAY Patient taking differently: Take 50 mg by mouth daily. TAKE 1 TABLET EVERY DAY 05/11/18   Hilty, Nadean Corwin, MD  Multiple Vitamins-Minerals (MULTIVITAMIN WITH MINERALS) tablet Take 1  tablet by mouth daily.    [provider]  polyethylene glycol (MIRALAX / GLYCOLAX) packet Take 17 g by mouth every other day.     [provider]  potassium chloride SA (K-DUR) 20 MEQ tablet TAKE 1 TABLET BY MOUTH ONCE DAILY WITH FUROSEMIDE 10/20/18   [provider]  predniSONE (DELTASONE) 20 MG tablet 2 tabs po daily x 4 days 01/05/19   Deno Etienne, DO  Teriparatide, Recombinant, (FORTEO [place]) Inject 1 pen into the skin daily.    [provider]    Physical Exam: Vitals:   01/05/19 1707 01/05/19 1730 01/05/19 1800 01/05/19 1830  BP: (!) 86/50 (!) 96/59 (!) 94/56 (!) 96/56  Pulse: 73 65 63 65  Resp: 16 11 11  (!) 9  Temp: 97.8 F (36.6 C)     TempSrc: Oral     SpO2: 90% (!) 88% 91% 93%    Constitutional: NAD, calm, comfortable, cachectic elderly female laying at about 30 degrees incline in bed Vitals:   01/05/19 1707 01/05/19 1730 01/05/19 1800 01/05/19 1830  BP: (!) 86/50 (!) 96/59 (!) 94/56 (!) 96/56  Pulse: 73 65 63 65  Resp: 16 11 11  (!) 9  Temp: 97.8 F (36.6 C)     TempSrc: Oral     SpO2: 90% (!) 88% 91% 93%   Eyes: PERRL, lids and conjunctivae normal ENMT: Mucous membranes are moist. Posterior pharynx clear of any exudate or lesions.Normal dentition.  Neck: normal, supple, no masses, no thyromegaly Respiratory: clear to auscultation bilaterally, no wheezing, no crackles. Normal respiratory effort but has some notable dyspnea when speaking-which patient reports is chronic. No accessory muscle use.  Cardiovascular: Regular rate and rhythm, no murmurs / rubs / gallops.  Mild erythema and weeping edema of all digits of the bilateral feet. 2+ pedal pulses. No carotid bruits.  Abdomen: no tenderness, no masses palpated. No hepatosplenomegaly. Bowel sounds positive.  Musculoskeletal: no clubbing / cyanosis. No joint deformity upper and lower extremities. Good ROM, no contractures. Normal muscle tone.  Skin: no rashes, lesions, ulcers. No induration  Neurologic: CN 2-12 grossly intact. Sensation intact, DTR normal. Strength 5/5 in all 4.  Psychiatric: Normal judgment and insight. Alert and oriented x 3. Normal mood.     Labs on Admission: I have personally reviewed following labs and imaging studies  CBC: Recent Labs  Lab 01/05/19 1550  WBC 5.0  NEUTROABS 3.2  HGB 14.4  HCT 43.9  MCV 94.4  PLT 607   Basic Metabolic Panel: Recent Labs  Lab 01/05/19 1550  NA 136  K 3.7  CL 96*  CO2 28  GLUCOSE 105*  BUN 28*  CREATININE 0.89  CALCIUM 9.3   GFR: CrCl cannot be calculated (Unknown ideal weight.). Liver Function Tests: Recent Labs  Lab 01/05/19 1550  AST 30  ALT 27  ALKPHOS 82  BILITOT 0.7  PROT 6.5  ALBUMIN 3.9   No results for input(s): LIPASE, AMYLASE in the last 168 hours. No results for input(s): AMMONIA in the last 168 hours. Coagulation Profile: No results for input(s): INR, PROTIME in the last 168 hours. Cardiac Enzymes: No results for input(s): CKTOTAL, CKMB, CKMBINDEX, TROPONINI in the last 168 hours. BNP (last 3 results) No results for input(s): PROBNP in the last 8760 hours. HbA1C: No results for input(s): HGBA1C in the last 72 hours. CBG: No results for input(s): GLUCAP in the last 168 hours. Lipid Profile: No results for input(s): CHOL, HDL, LDLCALC, TRIG, CHOLHDL, LDLDIRECT in the last 72 hours. Thyroid Function Tests: No results for input(s): TSH, T4TOTAL, FREET4, T3FREE, THYROIDAB in the last 72 hours. Anemia Panel: No results for input(s): VITAMINB12, FOLATE, FERRITIN, TIBC, IRON, RETICCTPCT in the last 72 hours. Urine analysis:    Component Value Date/Time   COLORURINE YELLOW 04/25/2018 0329   APPEARANCEUR HAZY (A) 04/25/2018 0329   LABSPEC 1.014 04/25/2018 0329   PHURINE 5.0 04/25/2018 0329   GLUCOSEU NEGATIVE 04/25/2018 0329   HGBUR NEGATIVE 04/25/2018 0329   BILIRUBINUR NEGATIVE 04/25/2018 0329   KETONESUR NEGATIVE 04/25/2018 0329   PROTEINUR NEGATIVE 04/25/2018 0329    UROBILINOGEN 0.2 05/17/2013 1207   NITRITE NEGATIVE 04/25/2018 0329   LEUKOCYTESUR NEGATIVE 04/25/2018 0329    Radiological Exams on Admission: Dg Chest Port 1 View  Result Date: 01/05/2019 CLINICAL DATA:  Shortness of breath, weakness and low O2 saturations. EXAM: PORTABLE CHEST 1 VIEW COMPARISON:  04/24/2018 FINDINGS: Right hemidiaphragm remains elevated. Left lung is hyperinflated with signs of emphysema. Cardiomediastinal contours are stable and there is atherosclerotic change in the thoracic aorta and left carotid artery. No acute bone finding. IMPRESSION: No acute cardiopulmonary disease. Dense calcification over left carotid artery and signs of aortic atherosclerosis. Aortic Atherosclerosis (ICD10-I70.0) and Emphysema (ICD10-J43.9). Electronically Signed   By: Zetta Bills M.D.   On: 01/05/2019 16:05    EKG: Independently reviewed.   Assessment/Plan  Hypotension secondary to COVID 19 infection - Improved with 2L NS fluids -Will hold off on any antibiotics due to the lack of respiratory symptoms -Unable to provide Remdesivir at this time due to restricted use and lack of hypoxia.  COPD  -Patient was initiated on doxycycline and steroids by EDP.  However does not suspect COPD exacerbation.  Patient is not hypoxic on room air.  No acute findings on EKG.  No wheezing on lung exam.  Cough with sputum production likely secondary to viral infection. -Albuterol PRN -Continue Symbicort  History of paroxysmal atrial fibrillation -Continue Eliquis -Continue Cardizem  Hypertension -Hold home Lasix, HCTZ and losartan secondary to hypotension  Hypothyroidism -Continue levothyroxine  Malnutrition -Protein caloric deficiency -Consult nutrition  DVT prophylaxis:.Eliquis  Code Status:Full Family Communication: Plan discussed with patient and daughter at bedside  disposition Plan: Home with at least 2 midnight stays -transfer to [facility] for [place] -19 infection Consults called:   Admission status: inpatient   Med Rec not completed by pharmacy at the time of this documentation.  Orene Desanctis DO [facility]   If 7PM-7AM, please contact night-coverage [email or web address] Password [email or web address]  01/05/2019, 8:59 PM

## 2019-01-05 NOTE — ED Notes (Signed)
[facility] contacted dispatch states it maybe several hours before pt is transported to [facility].

## 2019-01-05 NOTE — ED Notes (Signed)
RN had patient stand in room and do quick steps and walk and her O2 dropped to 88% on RA. Patient's O2 came back up to 94% after a few minutes of rest.

## 2019-01-05 NOTE — ED Provider Notes (Addendum)
[facility] DEPT Provider Note   CSN: [id number] Arrival date & time: 01/05/19  1458     History   Chief Complaint Chief Complaint  Patient presents with  . Fever  . Shortness of Breath    HPI Heather [name] is a 85 y.o. female.     85 yo F with a chief complaint of cough and shortness of breath.  Going on for about 4 days now.  Feels very fatigued if she gets up to move around.  Has had some mild chest discomfort with this.  Had some diarrhea at the onset which is resolved.  Denies abdominal pain.  No known sick contacts.  She saw her family doctor in the office today.  Had an oxygen saturation of 89% and a temperature of 100.3 and told to come to the ED for evaluation.  The history is provided by the patient.  Fever Associated symptoms: no chest pain, no chills, no congestion, no dysuria, no headaches, no myalgias, no nausea, no rhinorrhea and no vomiting   Shortness of Breath Associated symptoms: fever   Associated symptoms: no chest pain, no headaches, no vomiting and no wheezing   Illness Severity:  Moderate Onset quality:  Gradual Duration:  4 days Timing:  Constant Progression:  Worsening Chronicity:  New Associated symptoms: fever and shortness of breath   Associated symptoms: no chest pain, no congestion, no headaches, no myalgias, no nausea, no rhinorrhea, no vomiting and no wheezing     Past Medical History:  Diagnosis Date  . Anxiety   . Arthritis   . Atrial fibrillation ([place])   . COPD (chronic obstructive pulmonary disease) ([place])   . Dysrhythmia    AF  . Essential hypertension, benign   . Gallstones   . GERD (gastroesophageal reflux disease)   . H/O hiatal hernia   . History of nuclear stress test 10/2010   dipyridamole; normal pattern of perfusion; low risk, normal study  . 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 Mild aortic insufficiency   . Osteoporosis   . Peripheral neuropathy    . Pneumonia    states she had it twice, last time a couple of months ago  . PONV (postoperative nausea and vomiting)   . Shortness of breath    with exertion  . Unspecified hypothyroidism   . Venous insufficiency     Patient Active Problem List   Diagnosis Date Noted  . Acute respiratory failure with hypoxia ([place]) 04/27/2018  . Protein-calorie malnutrition, severe 04/26/2018  . Skin avulsion 09/19/2016  . Influenza B 04/16/2016  . Sepsis ([place]) 04/16/2016  . Hypokalemia 04/16/2016  . Hypothyroidism 04/16/2016  . Post herpetic neuralgia 06/06/2014  . Shingles outbreak 04/19/2014  . Gallstones 09/20/2013  . Community acquired pneumonia 05/17/2013  . COPD with acute exacerbation ([place]) 05/17/2013  . Dyspnea on exertion 04/25/2013  . Pulmonary emphysema ([place]) 11/15/2012  . HTN (hypertension) 11/15/2012  . PAF (paroxysmal atrial fibrillation) ([place]) 06/14/2012  . Long term current use of anticoagulant therapy 06/14/2012  . Ventral hernia 07/31/2011  . Abdominal wall mass 07/01/2011    Past Surgical History:  Procedure Laterality Date  . APPENDECTOMY  1935  . BACK SURGERY     x2  . Cardiometablic Testing  2/77/4128   submaximal effort with peak RER of 0.5, peak VO2 79% predicted; HR peak up to 78%; PVC was 55% predicted, PEV1 39% predicted; PEV1/VC ratio was reduced; normal vital capacity; DLCO was reduced to 63%  .  CARPAL TUNNEL RELEASE    . CATARACT EXTRACTION W/ INTRAOCULAR LENS  IMPLANT, BILATERAL    . CHOLECYSTECTOMY  04/07/2014   dr toth  . CHOLECYSTECTOMY N/A 04/07/2014   Procedure: LAPAROSCOPIC CHOLECYSTECTOMY WITH INTRAOPERATIVE CHOLANGIOGRAM POSSBILE OPEN;  Surgeon: Autumn Messing III, MD;  Location: [facility];  Service: General;  Laterality: N/A;  . COLON SURGERY  2008   partial  . ESOPHAGOGASTRODUODENOSCOPY (EGD) WITH PROPOFOL N/A 05/25/2017   Procedure: ESOPHAGOGASTRODUODENOSCOPY (EGD) WITH PROPOFOL;  Surgeon: Clarene Essex, MD;  Location: [facility];  Service: Endoscopy;   Laterality: N/A;  . HERNIA REPAIR    . JOINT REPLACEMENT    . left hip relacement  2000  . SAVORY DILATION N/A 05/25/2017   Procedure: SAVORY DILATION;  Surgeon: Clarene Essex, MD;  Location: [facility] ENDOSCOPY;  Service: Endoscopy;  Laterality: N/A;  . TONSILLECTOMY  1935  . TOTAL ABDOMINAL HYSTERECTOMY  1970  . TRANSTHORACIC ECHOCARDIOGRAM  05/2011   EF=>55%; mild conc LVH; mild mitral annular calcif; mild TR; AV mildly sclerotic & mild AR  . VENTRAL HERNIA REPAIR  09/19/2011   Procedure: LAPAROSCOPIC VENTRAL HERNIA;  Surgeon: Merrie Roof, MD;  Location: [facility];  Service: General;  Laterality: N/A;  laparoscopic ventral hernia repair with mesh     OB History   No obstetric history on file.      Home Medications    Prior to Admission medications   Medication Sig Start Date End Date Taking? Authorizing Provider  albuterol (PROVENTIL HFA;VENTOLIN HFA) 108 (90 BASE) MCG/ACT inhaler Inhale 2 puffs into the lungs every 6 (six) hours as needed for wheezing or shortness of breath. 05/23/13   Burnard Bunting, MD  albuterol (PROVENTIL) (2.5 MG/3ML) 0.083% nebulizer solution Take 2.5 mg by nebulization every 6 (six) hours as needed for wheezing or shortness of breath.    [provider]  apixaban (ELIQUIS) 2.5 MG TABS tablet Take 1 tablet (2.5 mg total) by mouth 2 (two) times daily. 07/01/18   Hilty, Nadean Corwin, MD  budesonide-formoterol (SYMBICORT) 160-4.5 MCG/ACT inhaler Inhale 2 puffs into the lungs 2 (two) times daily.     [provider]  calcium citrate-vitamin D (CITRACAL+D) 315-200 MG-UNIT per tablet Take 1 tablet by mouth daily.    [provider]  Carboxymethylcellulose Sodium (THERATEARS OP) Place 2 drops into both eyes 2 (two) times daily.    [provider]  diltiazem (CARDIZEM CD) 180 MG 24 hr capsule Take 180 mg by mouth daily.    [provider]  doxycycline (VIBRAMYCIN) 100 MG capsule Take 1 capsule (100 mg total) by mouth 2 (two) times daily.  One po bid x 7 days 01/05/19   Deno Etienne, DO  feeding supplement, ENSURE ENLIVE, (ENSURE ENLIVE) LIQD Take 237 mLs by mouth 2 (two) times daily between meals. 04/27/18   Mariel Aloe, MD  furosemide (LASIX) 20 MG tablet TAKE 1 TABLET BY MOUTH ONCE DAILY WITH POTASSIUM 10/21/18   [provider]  furosemide (LASIX) 40 MG tablet Take 1 tablet (40 mg total) by mouth as needed. 07/15/17   Almyra Deforest, PA  hydrochlorothiazide (HYDRODIURIL) 25 MG tablet Take 1 tablet (25 mg total) by mouth daily. 03/17/18   Hilty, Nadean Corwin, MD  Lactobacillus Reuteri (BIOGAIA PROBIOTIC PO) Take 1 capsule by mouth daily.    [provider]  levothyroxine (SYNTHROID, LEVOTHROID) 137 MCG tablet Take 137 mcg by mouth daily before breakfast.     [provider]  losartan (COZAAR) 100 MG tablet TAKE 1  TABLET EVERY DAY Patient taking differently: Take 50 mg by mouth daily. TAKE 1 TABLET EVERY DAY 05/11/18   Hilty, Nadean Corwin, MD  Multiple Vitamins-Minerals (MULTIVITAMIN WITH MINERALS) tablet Take 1 tablet by mouth daily.    [provider]  polyethylene glycol (MIRALAX / GLYCOLAX) packet Take 17 g by mouth every other day.     [provider]  potassium chloride SA (K-DUR) 20 MEQ tablet TAKE 1 TABLET BY MOUTH ONCE DAILY WITH FUROSEMIDE 10/20/18   [provider]  predniSONE (DELTASONE) 20 MG tablet 2 tabs po daily x 4 days 01/05/19   Deno Etienne, DO  Teriparatide, Recombinant, (FORTEO [place]) Inject 1 pen into the skin daily.    [provider]    Family History Family History  Problem Relation Age of Onset  . Stroke Mother   . Stroke Father   . Heart block Brother   . Bladder Cancer Brother   . Diabetes Brother   . Stroke Brother     Social History Social History   Tobacco Use  . Smoking status: Former Smoker    Packs/day: 1.00    Years: 20.00    Pack years: 20.00    Types: Cigarettes    Quit date: 09/12/1978    Years since quitting: 40.3  . Smokeless  tobacco: Never Used  Substance Use Topics  . Alcohol use: Yes    Alcohol/week: 0.0 standard drinks    Comment: occ  . Drug use: No     Allergies   Ace inhibitors and Sulfur   Review of Systems Review of Systems  Constitutional: Positive for fever. Negative for chills.  HENT: Negative for congestion and rhinorrhea.   Eyes: Negative for redness and visual disturbance.  Respiratory: Positive for shortness of breath. Negative for wheezing.   Cardiovascular: Negative for chest pain and palpitations.  Gastrointestinal: Negative for nausea and vomiting.  Genitourinary: Negative for dysuria and urgency.  Musculoskeletal: Negative for arthralgias and myalgias.  Skin: Negative for pallor and wound.  Neurological: Negative for dizziness and headaches.     Physical Exam Updated Vital Signs BP (!) 100/59   Pulse 90   Temp 98.3 F (36.8 C) (Oral)   Resp (!) 22   SpO2 93%   Physical Exam Vitals signs and nursing note reviewed.  Constitutional:      General: She is not in acute distress.    Appearance: She is well-developed. She is not diaphoretic.  HENT:     Head: Normocephalic and atraumatic.  Eyes:     Pupils: Pupils are equal, round, and reactive to light.  Neck:     Musculoskeletal: Normal range of motion and neck supple.  Cardiovascular:     Rate and Rhythm: Normal rate and regular rhythm.     Heart sounds: No murmur. No friction rub. No gallop.   Pulmonary:     Effort: Pulmonary effort is normal.     Breath sounds: No wheezing or rales.  Abdominal:     General: There is no distension.     Palpations: Abdomen is soft.     Tenderness: There is no abdominal tenderness.  Musculoskeletal:        General: No tenderness.  Skin:    General: Skin is warm and dry.  Neurological:     Mental Status: She is alert and oriented to person, place, and time.  Psychiatric:        Behavior: Behavior normal.      ED Treatments / Results  Labs (  all labs ordered are listed,  but only abnormal results are displayed) Labs Reviewed  COMPREHENSIVE METABOLIC PANEL - Abnormal; Notable for the following components:      Result Value   Chloride 96 (*)    Glucose, Bld 105 (*)    BUN 28 (*)    GFR calc non Af Amer 57 (*)    All other components within normal limits  NOVEL CORONAVIRUS, NAA (HOSP ORDER, SEND-OUT TO REF LAB; TAT 18-24 HRS)  CBC WITH DIFFERENTIAL/PLATELET  TROPONIN I (HIGH SENSITIVITY)    EKG EKG Interpretation  Date/Time:  Wednesday January 05 2019 15:42:57 EDT Ventricular Rate:  82 PR Interval:    QRS Duration: 79 QT Interval:  382 QTC Calculation: 447 R Axis:   65 Text Interpretation:  Sinus rhythm No significant change since last tracing Confirmed by Deno Etienne [phone]) on 01/05/2019 3:44:49 PM   Radiology Dg Chest Port 1 View  Result Date: 01/05/2019 CLINICAL DATA:  Shortness of breath, weakness and low O2 saturations. EXAM: PORTABLE CHEST 1 VIEW COMPARISON:  04/24/2018 FINDINGS: Right hemidiaphragm remains elevated. Left lung is hyperinflated with signs of emphysema. Cardiomediastinal contours are stable and there is atherosclerotic change in the thoracic aorta and left carotid artery. No acute bone finding. IMPRESSION: No acute cardiopulmonary disease. Dense calcification over left carotid artery and signs of aortic atherosclerosis. Aortic Atherosclerosis (ICD10-I70.0) and Emphysema (ICD10-J43.9). Electronically Signed   By: Zetta Bills M.D.   On: 01/05/2019 16:05    Procedures Procedures (including critical care time)  Medications Ordered in ED Medications  predniSONE (DELTASONE) tablet 60 mg (has no administration in time range)  doxycycline (VIBRA-TABS) tablet 100 mg (has no administration in time range)  sodium chloride 0.9 % bolus 1,000 mL (1,000 mLs Intravenous New Bag/Given 01/05/19 1548)  chlorpheniramine-HYDROcodone (TUSSIONEX) 10-8 MG/5ML suspension 5 mL (5 mLs Oral Given 01/05/19 1554)  acetaminophen (TYLENOL) tablet 1,000 mg  (1,000 mg Oral Given 01/05/19 1554)  ketorolac (TORADOL) 30 MG/ML injection 15 mg (15 mg Intravenous Given 01/05/19 1548)     Initial Impression / Assessment and Plan / ED Course  I have reviewed the triage vital signs and the nursing notes.  Pertinent labs & imaging results that were available during my care of the patient were reviewed by me and considered in my medical decision making (see chart for details).        85 yo F with a chief complaints of cough fever and shortness of breath.  This been going on for about 4 days now.  No known sick contacts.  Seen at the family doctor's office and some concern for the novel coronavirus.  Sent here for evaluation.  My exam with clear lung sounds.  She was able to ambulate in the room and had an oxygen saturation of 95%.  No significant tachypnea.  We will give the patient a bolus of IV fluids cough medicine Tylenol and Toradol check lab work reassess.  Patient was able to ambulate to assess that she could around the bed without having any hypoxia.  Patient would have transient dips into the upper 80s without any worsening symptomatology.  Never got below 34.  I have offered admission, discussed what typically would happen in the hospital.  Patient feels that she has been doing well at home and would prefer to stay at home.  She told me that she has done a breathing treatment just prior to arrival.  I will start her on antibiotics and steroids for presumptive COPD exacerbation.  We will have her continue to use her breathing treatments at home.  We will have her return for any worsening symptoms.  Sent an outpatient coronavirus test as the symptoms are occurring during the novel coronavirus pandemic.  BRITENY FULGHUM was evaluated in Emergency Department on 01/05/2019 for the symptoms described in the history of present illness. He/she was evaluated in the context of the global COVID-19 pandemic, which necessitated consideration that the patient might be  at risk for infection with the SARS-CoV-2 virus that causes COVID-19. Institutional protocols and algorithms that pertain to the evaluation of patients at risk for COVID-19 are in a state of rapid change based on information released by regulatory bodies including the CDC and federal and state organizations. These policies and algorithms were followed during the patient's care in the ED.   The patient was getting ready to be discharged and when she stood up felt a little bit lightheaded.  Blood pressure was rechecked and she was noted to have a blood pressure in the 80s.  Rechecked multiple times without change.  Given a second bolus of IV fluids with some improvement.  Patient still would like to go home though after a long discussion with her about her ongoing recent illness and borderline hypoxia and borderline low blood pressure she agrees to come to the hospital.  Will discuss with the hospitalist.  CRITICAL CARE Performed by: Cecilio Asper   Total critical care time: 35 minutes  Critical care time was exclusive of separately billable procedures and treating other patients.  Critical care was necessary to treat or prevent imminent or life-threatening deterioration.  Critical care was time spent personally by me on the following activities: development of treatment plan with patient and/or surrogate as well as nursing, discussions with consultants, evaluation of patient's response to treatment, examination of patient, obtaining history from patient or surrogate, ordering and performing treatments and interventions, ordering and review of laboratory studies, ordering and review of radiographic studies, pulse oximetry and re-evaluation of patient's condition.   The patient is noted to have a MAP's <65/ SBP's <90. With the current information available to me, I don't think the patient is in septic shock. The MAP's <65/ SBP's <90, is related to [place] .    Medications  given during this visit Medications  predniSONE (DELTASONE) tablet 60 mg (has no administration in time range)  doxycycline (VIBRA-TABS) tablet 100 mg (has no administration in time range)  sodium chloride 0.9 % bolus 1,000 mL (1,000 mLs Intravenous New Bag/Given 01/05/19 1548)  chlorpheniramine-HYDROcodone (TUSSIONEX) 10-8 MG/5ML suspension 5 mL (5 mLs Oral Given 01/05/19 1554)  acetaminophen (TYLENOL) tablet 1,000 mg (1,000 mg Oral Given 01/05/19 1554)  ketorolac (TORADOL) 30 MG/ML injection 15 mg (15 mg Intravenous Given 01/05/19 1548)       Final Clinical Impressions(s) / ED Diagnoses   Final diagnoses:  COPD exacerbation [facility])    ED Discharge Orders         Ordered    doxycycline (VIBRAMYCIN) 100 MG capsule  2 times daily     01/05/19 1647    predniSONE (DELTASONE) 20 MG tablet     01/05/19 [place], [place], DO 01/05/19 2304

## 2019-01-06 ENCOUNTER — Inpatient Hospital Stay (HOSPITAL_COMMUNITY): Payer: Medicare HMO

## 2019-01-06 ENCOUNTER — Ambulatory Visit (HOSPITAL_COMMUNITY)
Admit: 2019-01-06 | Discharge: 2019-01-06 | Disposition: A | Discharge status: Home / Self Care | Payer: Medicare HMO | Attending: Internal Medicine | Admitting: Internal Medicine

## 2019-01-06 DIAGNOSIS — K439 Ventral hernia without obstruction or gangrene: Secondary | ICD-10-CM

## 2019-01-06 DIAGNOSIS — E43 Unspecified severe protein-calorie malnutrition: Secondary | ICD-10-CM | POA: Diagnosis not present

## 2019-01-06 DIAGNOSIS — U071 COVID-19: Secondary | ICD-10-CM

## 2019-01-06 DIAGNOSIS — J1282 Pneumonia due to coronavirus disease 2019: Secondary | ICD-10-CM

## 2019-01-06 DIAGNOSIS — J9601 Acute respiratory failure with hypoxia: Secondary | ICD-10-CM | POA: Diagnosis not present

## 2019-01-06 DIAGNOSIS — J1289 Other viral pneumonia: Secondary | ICD-10-CM

## 2019-01-06 DIAGNOSIS — I480 Paroxysmal atrial fibrillation: Secondary | ICD-10-CM

## 2019-01-06 DIAGNOSIS — Z681 Body mass index (BMI) 19 or less, adult: Secondary | ICD-10-CM | POA: Diagnosis not present

## 2019-01-06 DIAGNOSIS — I10 Essential (primary) hypertension: Secondary | ICD-10-CM | POA: Diagnosis not present

## 2019-01-06 DIAGNOSIS — I959 Hypotension, unspecified: Secondary | ICD-10-CM | POA: Diagnosis not present

## 2019-01-06 DIAGNOSIS — J438 Other emphysema: Secondary | ICD-10-CM

## 2019-01-06 DIAGNOSIS — J439 Emphysema, unspecified: Secondary | ICD-10-CM | POA: Diagnosis not present

## 2019-01-06 DIAGNOSIS — J9811 Atelectasis: Secondary | ICD-10-CM | POA: Diagnosis not present

## 2019-01-06 MED ORDER — SODIUM CHLORIDE 0.9 % IV SOLN
200.0000 mg | Freq: Once | INTRAVENOUS | Status: AC
  Administered 2019-01-06: 200 mg via INTRAVENOUS
  Filled 2019-01-06: qty 40

## 2019-01-06 MED ORDER — SODIUM CHLORIDE 0.9 % IV SOLN
100.0000 mg | INTRAVENOUS | Status: AC
  Administered 2019-01-07 – 2019-01-10 (×4): 100 mg via INTRAVENOUS
  Filled 2019-01-06 (×4): qty 20

## 2019-01-06 MED ORDER — ALBUTEROL SULFATE HFA 108 (90 BASE) MCG/ACT IN AERS
1.0000 | INHALATION_SPRAY | Freq: Four times a day (QID) | RESPIRATORY_TRACT | Status: DC | PRN
  Filled 2019-01-06: qty 6.7

## 2019-01-06 MED ORDER — ENSURE ENLIVE PO LIQD
237.0000 mL | Freq: Two times a day (BID) | ORAL | Status: DC
  Administered 2019-01-06 – 2019-01-11 (×8): 237 mL via ORAL

## 2019-01-06 MED ORDER — DEXAMETHASONE SODIUM PHOSPHATE 10 MG/ML IJ SOLN
6.0000 mg | Freq: Every day | INTRAMUSCULAR | Status: DC
  Administered 2019-01-06 – 2019-01-11 (×6): 6 mg via INTRAVENOUS
  Filled 2019-01-06 (×6): qty 1
  Filled 2019-01-06: qty 0.6

## 2019-01-06 NOTE — Progress Notes (Signed)
PROGRESS NOTE    Heather [name]  KVQ:[id number] DOB: May 17, 1926 DOA: 01/05/2019 PCP: Burnard Bunting, MD    Brief Narrative:  85 year old female who presented with fever.  She does have significant past medical history for paroxysmal atrial fibrillation, COPD and hypertension.  Patient reported 3 days of fever, fatigue, sore throat, runny nose and body aches.  Her dyspnea has been stable but she had noticed increased sputum production along with cough.  She was seen by her primary care physician who found her hypoxic and sent her to the emergency room.  On initial physical examination patient was afebrile, her blood pressure was 90/50, her oxygen saturation was 92 to 93%, at rest, on ambulation 88%, she had moist mucous membranes, her lungs are clear to auscultation bilaterally, positive dyspnea, heart S1-S2 present rhythm, abdomen soft, positive pedal edema bilaterally. Sodium 136, potassium 3.7, chloride 96 bicarb 20, glucose 105, BUN 28, creatinine 0.89, white count 5.0, hemoglobin 14.4, hematocrit 43.9, platelets 219.  Chest radiograph had hyperinflation no infiltrates.  82 bpm, normal axis, normal intervals, sinus rhythm, no ST segment T wave changes.  Patient was admitted to the hospital with a working diagnosis of hypotension and hypoxemia in the setting of SARS COVID-19 infection  Assessment & Plan:   Principal Problem:   Acute respiratory failure with hypoxemia (HCC) Active Problems:   Ventral hernia   PAF (paroxysmal atrial fibrillation) (HCC)   Pulmonary emphysema (HCC)   HTN (hypertension)   Hypotension   Pneumonia due to COVID-19 virus    1. Acute hypoxic respiratory failure suspected due to SARS COVID 19 pneumonia. Patient has persistent hypoxemia down to 70-88% on room air, she has baseline COPD, but hypoxemia seems to be new. Her repeat chest film continue to have no defined infiltrate/ personally reviewed. The pretest probability for viral pneumonia is high, patient  with significant risk factors for poor outcomes, including elderly, deconditioned and baseline COPD, will start treatment with Remdesivir and Dexamethasone, follow with non contrast CT chest. Continue oxymetry monitoring and will check inflammatory markers.  2. Hypotension due to volume depletion. Patient with improved blood pressure after isotonic fluids, blood pressure this am 132/77. Will continue blood pressure monitoring, hold on antihypertensive medications. Hold on IV fluids for now, follow a restrictive IV fluids strategy.   3. Paroxysmal atrial fibrillation. Continue rate control with diltiazem and telemetry monitoring. Continue anticoagulation with apixaban.   4. COPD. No signs of acute exacerbation, will continue as needed bronchodilator therapy.   5. Protein calorie malnutrition/ undetermined severity. Will continue nutritional supplements.   6. Hypothyroid. Continue with levothyroxine.   DVT prophylaxis: apixaban   Code Status:  full Family Communication: no family at the bedside  Disposition Plan/ discharge barriers: pending clinical improvement.   Body mass index is 17.36 kg/m. Malnutrition Type:      Malnutrition Characteristics:      Nutrition Interventions:     RN Pressure Injury Documentation:     Consultants:     Procedures:     Antimicrobials:   Remdesivir 01/06/19.     Subjective: Patient does have dyspnea with minimal efforts. She mentions is at her baseline, no cough, no nausea or vomiting. Positive weakness.   Objective: Vitals:   01/06/19 0100 01/06/19 0130 01/06/19 0250 01/06/19 0827  BP: 97/66 98/61 122/78 133/81  Pulse: 70 70 74 81  Resp: 11 12 15  (!) 27  Temp:   97.7 F (36.5 C) 98.4 F (36.9 C)  TempSrc:   Oral Oral  SpO2: [place] Kitchen)  88% (!) 87% 100% 94%  Weight:   44.5 kg   Height:   5\' 3"  (1.6 m)     Intake/Output Summary (Last 24 hours) at 01/06/2019 0906 Last data filed at 01/06/2019 0700 Gross per 24 hour  Intake  2439.48 ml  Output -  Net 2439.48 ml   Filed Weights   01/06/19 0250  Weight: 44.5 kg    Examination:   General: Evident dyspnea at rest and on exertion.  Neurology: Awake and alert, non focal  E ENT: mild pallor, no icterus, oral mucosa moist Cardiovascular: No JVD. S1-S2 present, rhythmic, no gallops, rubs, or murmurs. No lower extremity edema. Pulmonary: positive breath sounds bilaterally. Gastrointestinal. Abdomen with no organomegaly, non tender, no rebound or guarding Skin. No rashes Musculoskeletal: no joint deformities     Data Reviewed: I have personally reviewed following labs and imaging studies  CBC: Recent Labs  Lab 01/05/19 1550  WBC 5.0  NEUTROABS 3.2  HGB 14.4  HCT 43.9  MCV 94.4  PLT 599   Basic Metabolic Panel: Recent Labs  Lab 01/05/19 1550  NA 136  K 3.7  CL 96*  CO2 28  GLUCOSE 105*  BUN 28*  CREATININE 0.89  CALCIUM 9.3   GFR: Estimated Creatinine Clearance: 30.1 mL/min (by C-G formula based on SCr of 0.89 mg/dL). Liver Function Tests: Recent Labs  Lab 01/05/19 1550  AST 30  ALT 27  ALKPHOS 82  BILITOT 0.7  PROT 6.5  ALBUMIN 3.9   No results for input(s): LIPASE, AMYLASE in the last 168 hours. No results for input(s): AMMONIA in the last 168 hours. Coagulation Profile: No results for input(s): INR, PROTIME in the last 168 hours. Cardiac Enzymes: No results for input(s): CKTOTAL, CKMB, CKMBINDEX, TROPONINI in the last 168 hours. BNP (last 3 results) No results for input(s): PROBNP in the last 8760 hours. HbA1C: No results for input(s): HGBA1C in the last 72 hours. CBG: No results for input(s): GLUCAP in the last 168 hours. Lipid Profile: No results for input(s): CHOL, HDL, LDLCALC, TRIG, CHOLHDL, LDLDIRECT in the last 72 hours. Thyroid Function Tests: No results for input(s): TSH, T4TOTAL, FREET4, T3FREE, THYROIDAB in the last 72 hours. Anemia Panel: No results for input(s): VITAMINB12, FOLATE, FERRITIN, TIBC, IRON,  RETICCTPCT in the last 72 hours.    Radiology Studies: I have reviewed all of the imaging during this hospital visit personally     Scheduled Meds: . apixaban  2.5 mg Oral BID  . diltiazem  180 mg Oral Daily  . levothyroxine  137 mcg Oral Q0600  . mometasone-formoterol  2 puff Inhalation BID   Continuous Infusions: . sodium chloride 100 mL/hr at 01/06/19 0700     LOS: 1 day         Gerome Apley, MD

## 2019-01-06 NOTE — ED Notes (Signed)
ED TO INPATIENT HANDOFF REPORT  Name/Age/Gender Heather [name] 85 y.o. female  Code Status    Code Status Orders  (From admission, onward)         Start     Ordered   01/05/19 2057  Full code  Continuous     01/05/19 2057        Code Status History    Date Active Date Inactive Code Status Order ID Comments User Context   04/24/2018 2342 04/27/2018 2013 Full Code [id number]  Etta Quill, DO ED   04/16/2016 2245 04/19/2016 2056 Full Code [id number]  Norval Morton, MD ED   04/07/2014 1703 04/08/2014 1737 Full Code [id number]  Jovita Kussmaul, MD Inpatient   05/17/2013 1746 05/23/2013 1858 Full Code [id number]  Marton Redwood, MD Inpatient   Advance Care Planning Activity      Home/SNF/Other Home  Chief Complaint shob; oxygen 89; temp 100.3; ref. by dr  Level of Care/Admitting Diagnosis ED Disposition    ED Disposition Condition [facility] Area: [place] [[id number]]  Level of Care: Telemetry [5]  Covid Evaluation: Confirmed COVID Positive  Diagnosis: Hypotension [528413]  Admitting Physician: Orene Desanctis [[id number]]  Attending Physician: Orene Desanctis [[id number]]  Estimated length of stay: past midnight tomorrow  Certification:: I certify this patient will need inpatient services for at least 2 midnights  PT Class (Do Not Modify): Inpatient [101]  PT Acc Code (Do Not Modify): Private [1]       Medical History Past Medical History:  Diagnosis Date  . Anxiety   . Arthritis   . Atrial fibrillation ([place])   . COPD (chronic obstructive pulmonary disease) ([place])   . Dysrhythmia    AF  . Essential hypertension, benign   . Gallstones   . GERD (gastroesophageal reflux disease)   . H/O hiatal hernia   . History of nuclear stress test 10/2010   dipyridamole; normal pattern of perfusion; low risk, normal study  . 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 Mild aortic insufficiency   . Osteoporosis   . Peripheral  neuropathy   . Pneumonia    states she had it twice, last time a couple of months ago  . PONV (postoperative nausea and vomiting)   . Shortness of breath    with exertion  . Unspecified hypothyroidism   . Venous insufficiency     Allergies Allergies  Allergen Reactions  . Ace Inhibitors Cough  . Sulfur Nausea And Vomiting    IV Location/Drains/Wounds Patient Lines/Drains/Airways Status   Active Line/Drains/Airways    Name:   Placement date:   Placement time:   Site:   Days:   Peripheral IV 01/05/19 Right Forearm   01/05/19    1547    Forearm   1   Incision 09/19/11 Abdomen Other (Comment)   09/19/11    0818     2666   Incision (Closed) 04/07/14 Abdomen Other (Comment)   04/07/14    1508     1735   Incision - 4 Ports Abdomen 1: Umbilicus 2: Right 3: Right 4: Upper   04/07/14    1500     1735   Wound / Incision (Open or Dehisced) 06/26/14 Laceration Eye Left 3cm laceration Left eyebrow   06/26/14    1507    Eye   1655          Labs/Imaging Results for orders placed or performed during the hospital  encounter of 01/05/19 (from the past 48 hour(s))  CBC with Differential     Status: None   Collection Time: 01/05/19  3:50 PM  Result Value Ref Range   WBC 5.0 4.0 - 10.5 K/uL   RBC 4.65 3.87 - 5.11 MIL/uL   Hemoglobin 14.4 12.0 - 15.0 g/dL   HCT 43.9 36.0 - 46.0 %   MCV 94.4 80.0 - 100.0 fL   MCH 31.0 26.0 - 34.0 pg   MCHC 32.8 30.0 - 36.0 g/dL   RDW 14.6 11.5 - 15.5 %   Platelets 219 150 - 400 K/uL   nRBC 0.0 0.0 - 0.2 %   Neutrophils Relative % 63 %   Neutro Abs 3.2 1.7 - 7.7 K/uL   Lymphocytes Relative 28 %   Lymphs Abs 1.4 0.7 - 4.0 K/uL   Monocytes Relative 7 %   Monocytes Absolute 0.4 0.1 - 1.0 K/uL   Eosinophils Relative 1 %   Eosinophils Absolute 0.0 0.0 - 0.5 K/uL   Basophils Relative 0 %   Basophils Absolute 0.0 0.0 - 0.1 K/uL   Immature Granulocytes 1 %   Abs Immature Granulocytes 0.03 0.00 - 0.07 K/uL    Comment: Performed at [facility], [place] [address], [place], [place] [address]  Comprehensive metabolic panel     Status: Abnormal   Collection Time: 01/05/19  3:50 PM  Result Value Ref Range   Sodium 136 135 - 145 mmol/L   Potassium 3.7 3.5 - 5.1 mmol/L   Chloride 96 (L) 98 - 111 mmol/L   CO2 28 22 - 32 mmol/L   Glucose, Bld 105 (H) 70 - 99 mg/dL   BUN 28 (H) 8 - 23 mg/dL   Creatinine, Ser 0.89 0.44 - 1.00 mg/dL   Calcium 9.3 8.9 - 10.3 mg/dL   Total Protein 6.5 6.5 - 8.1 g/dL   Albumin 3.9 3.5 - 5.0 g/dL   AST 30 15 - 41 U/L   ALT 27 0 - 44 U/L   Alkaline Phosphatase 82 38 - 126 U/L   Total Bilirubin 0.7 0.3 - 1.2 mg/dL   GFR calc non Af Amer 57 (L) >60 mL/min   GFR calc Af Amer >60 >60 mL/min   Anion gap 12 5 - 15    Comment: Performed at [facility], [place] [address] [place], [place] [address]  Troponin I (High Sensitivity)     Status: None   Collection Time: 01/05/19  3:50 PM  Result Value Ref Range   Troponin I (High Sensitivity) 10 <18 ng/L    Comment: (NOTE) Elevated high sensitivity troponin I (hsTnI) values and significant  changes across serial measurements may suggest ACS but many other  chronic and acute conditions are known to elevate hsTnI results.  Refer to the "Links" section for chest pain algorithms and additional  guidance. Performed at [facility], [place] [address] [place], [place] [address]   SARS Coronavirus 2 [facility] order, Performed in [facility] hospital lab) Nasopharyngeal Nasopharyngeal Swab     Status: Abnormal   Collection Time: 01/05/19  5:16 PM   Specimen: Nasopharyngeal Swab  Result Value Ref Range   SARS Coronavirus 2 POSITIVE (A) NEGATIVE    Comment: CRITICAL RESULT CALLED TO, READ BACK BY AND VERIFIED WITH: RN k zuleta at [place] 01/05/19 cru[place]hank a (NOTE) If result is NEGATIVE SARS-CoV-2 target nucleic acids are NOT DETECTED. The SARS-CoV-2 RNA is generally detectable in upper and lower  respiratory specimens  during the  acute phase of infection. The lowest  concentration of SARS-CoV-2 viral copies this assay can detect is 250  copies / mL. A negative result does not preclude SARS-CoV-2 infection  and should not be used as the sole basis for treatment or other  patient management decisions.  A negative result may occur with  improper specimen collection / handling, submission of specimen other  than nasopharyngeal swab, presence of viral mutation(s) within the  areas targeted by this assay, and inadequate number of viral copies  (<250 copies / mL). A negative result must be combined with clinical  observations, patient history, and epidemiological information. If result is POSITIVE SARS-CoV-2 target nucleic acids a re DETECTED. The SARS-CoV-2 RNA is generally detectable in upper and lower  respiratory specimens during the acute phase of infection.  Positive  results are indicative of active infection with SARS-CoV-2.  Clinical  correlation with patient history and other diagnostic information is  necessary to determine patient infection status.  Positive results do  not rule out bacterial infection or co-infection with other viruses. If result is PRESUMPTIVE POSTIVE SARS-CoV-2 nucleic acids MAY BE PRESENT.   A presumptive positive result was obtained on the submitted specimen  and confirmed on repeat testing.  While 2019 novel coronavirus  (SARS-CoV-2) nucleic acids may be present in the submitted sample  additional confirmatory testing may be necessary for epidemiological  and / or clinical management purposes  to differentiate between  SARS-CoV-2 and other Sarbecovirus currently known to infect humans.  If clinically indicated additional testing with an alternate test  methodolo gy [phone]) is advised. The SARS-CoV-2 RNA is generally  detectable in upper and lower respiratory specimens during the acute  phase of infection. The expected result is Negative. Fact Sheet for Patients:   StrictlyIdeas.no Fact Sheet for Healthcare Providers: BankingDealers.co.za This test is not yet approved or cleared by the [place] FDA and has been authorized for detection and/or diagnosis of SARS-CoV-2 by FDA under an Emergency Use Authorization (EUA).  This EUA will remain in effect (meaning this test can be used) for the duration of the COVID-19 declaration under Section 564(b)(1) of the Act, 21 U.S.C. section 360bbb-3(b)(1), unless the authorization is terminated or revoked sooner. Performed at [facility], [place] [address] [place], [place] [address]   ABO/Rh     Status: None   Collection Time: 01/05/19  8:57 PM  Result Value Ref Range   ABO/RH(D)      B POS Performed at [facility], [place] [address] [place], [place] [address]    Dg Chest Port 1 View  Result Date: 01/05/2019 CLINICAL DATA:  Shortness of breath, weakness and low O2 saturations. EXAM: PORTABLE CHEST 1 VIEW COMPARISON:  04/24/2018 FINDINGS: Right hemidiaphragm remains elevated. Left lung is hyperinflated with signs of emphysema. Cardiomediastinal contours are stable and there is atherosclerotic change in the thoracic aorta and left carotid artery. No acute bone finding. IMPRESSION: No acute cardiopulmonary disease. Dense calcification over left carotid artery and signs of aortic atherosclerosis. Aortic Atherosclerosis (ICD10-I70.0) and Emphysema (ICD10-J43.9). Electronically Signed   By: Zetta Bills M.D.   On: 01/05/2019 16:05    Pending Labs Unresulted Labs (From admission, onward)   None      Vitals/Pain Today's Vitals   01/06/19 0000 01/06/19 0030 01/06/19 0100 01/06/19 0130  BP: 97/63 116/74 97/66 98/61   Pulse: 73 73 70 70  Resp: 17 15 11 12   Temp:      TempSrc:  SpO2: (!) 70% 90% (!) 88% (!) 87%  PainSc:        Isolation Precautions Airborne and Contact precautions  Medications Medications   acetaminophen (TYLENOL) tablet 650 mg (has no administration in time range)  diltiazem (CARDIZEM CD) 24 hr capsule 180 mg (has no administration in time range)  levothyroxine (SYNTHROID) tablet 137 mcg (has no administration in time range)  apixaban (ELIQUIS) tablet 2.5 mg (2.5 mg Oral Given 01/06/19 0035)  mometasone-formoterol (DULERA) 200-5 MCG/ACT inhaler 2 puff (has no administration in time range)  albuterol (PROVENTIL) (2.5 MG/3ML) 0.083% nebulizer solution 2.5 mg (has no administration in time range)  0.9 %  sodium chloride infusion ( Intravenous Transfusing/Transfer 01/06/19 0248)  sodium chloride 0.9 % bolus 1,000 mL (0 mLs Intravenous Stopped 01/05/19 1713)  chlorpheniramine-HYDROcodone (TUSSIONEX) 10-8 MG/5ML suspension 5 mL (5 mLs Oral Given 01/05/19 1554)  acetaminophen (TYLENOL) tablet 1,000 mg (1,000 mg Oral Given 01/05/19 1554)  ketorolac (TORADOL) 30 MG/ML injection 15 mg (15 mg Intravenous Given 01/05/19 1548)  predniSONE (DELTASONE) tablet 60 mg (60 mg Oral Given 01/05/19 1707)  doxycycline (VIBRA-TABS) tablet 100 mg (100 mg Oral Given 01/05/19 1707)  sodium chloride 0.9 % bolus 1,000 mL (0 mLs Intravenous Stopped 01/05/19 2129)    Mobility walks

## 2019-01-06 NOTE — Progress Notes (Signed)
Patient deferred family update phone call. 

## 2019-01-06 NOTE — Progress Notes (Signed)
Initial Nutrition Assessment  DOCUMENTATION CODES:   Underweight  INTERVENTION:   Liberalize diet to Regular  Ensure Enlive po BID, each supplement provides 350 kcal and 20 grams of protein  Pt receiving Hormel Shake daily with Breakfast which provides 520 kcals and 22 g of protein and Magic cup BID with lunch and dinner, each supplement provides 290 kcal and 9 grams of protein, automatically on meal trays to optimize nutritional intake.   Encourage PO intake   NUTRITION DIAGNOSIS:   Increased nutrient needs related to (COVID-19 PNA) as evidenced by estimated needs.  GOAL:   Patient will meet greater than or equal to 90% of their needs  MONITOR:   PO intake, Supplement acceptance  REASON FOR ASSESSMENT:   Consult Assessment of nutrition requirement/status  ASSESSMENT:   Pt with PMH of afib on Eliquis, COPD, HOH, HTN, and severe malnutrition who was admitted with fever/fatigue found to be hypoxic with COVID-19 PNA.   Noted in previous RD assessment pt met criteria for severe malnutrition due to physical exam.   Medications reviewed and include: decadron,  Labs reviewed    NUTRITION - FOCUSED PHYSICAL EXAM:  Deferred; RD working remotely   Diet Order:   Diet Order            Diet regular Room service appropriate? Yes; Fluid consistency: Thin  Diet effective now              EDUCATION NEEDS:   No education needs have been identified at this time  Skin:  Skin Assessment: Reviewed RN Assessment  Last BM:  10/7  Height:   Ht Readings from Last 1 Encounters:  01/06/19 '5\' 3"'  (1.6 m)    Weight:   Wt Readings from Last 1 Encounters:  01/06/19 44.5 kg    Ideal Body Weight:  52.2 kg  BMI:  Body mass index is 17.36 kg/m.  Estimated Nutritional Needs:   Kcal:  1350-1550  Protein:  65-85 grams  Fluid:  >1.5 L/day  Maylon Peppers RD, LDN, CNSC [phone] Pager [phone] After Hours Pager

## 2019-01-06 NOTE — Progress Notes (Signed)
Remdesivir - Pharmacy Brief Note   O:  ALT: 27 CXR: 10/7: No acute cardiopulmonary disease.  10/8 pending or SpO2: 89% on room air   A/P:  Remdesivir 200 mg once followed by 100 mg daily x 4 days.   Gretta Arab PharmD, BCPS Clinical pharmacist phone 7am- 5pm: [phone] 01/06/2019 10:34 AM

## 2019-01-06 NOTE — Discharge Instructions (Signed)
Take tylenol 2 pills 4 times a day.  Drink plenty of fluids.  Return for worsening shortness of breath, headache, confusion. Follow up with your family doctor.      Person Under Monitoring Name: Heather [name]  Location: [address] [place] [address]   Infection Prevention Recommendations for Individuals Confirmed to have, or Being Evaluated for, 2019 Novel Coronavirus (COVID-19) Infection Who Receive Care at Home  Individuals who are confirmed to have, or are being evaluated for, COVID-19 should follow the prevention steps below until a healthcare provider or local or state health department says they can return to normal activities.  Stay home except to get medical care You should restrict activities outside your home, except for getting medical care. Do not go to work, school, or public areas, and do not use public transportation or taxis.  Call ahead before visiting your doctor Before your medical appointment, call the healthcare provider and tell them that you have, or are being evaluated for, COVID-19 infection. This will help the healthcare providers office take steps to keep other people from getting infected. Ask your healthcare provider to call the local or state health department.  Monitor your symptoms Seek prompt medical attention if your illness is worsening (e.g., difficulty breathing). Before going to your medical appointment, call the healthcare provider and tell them that you have, or are being evaluated for, COVID-19 infection. Ask your healthcare provider to call the local or state health department.  Wear a facemask You should wear a facemask that covers your nose and mouth when you are in the same room with other people and when you visit a healthcare provider. People who live with or visit you should also wear a facemask while they are in the same room with you.  Separate yourself from other people in your home As much as possible, you should stay in a  different room from other people in your home. Also, you should use a separate bathroom, if available.  Avoid sharing household items You should not share dishes, drinking glasses, cups, eating utensils, towels, bedding, or other items with other people in your home. After using these items, you should wash them thoroughly with soap and water.  Cover your coughs and sneezes Cover your mouth and nose with a tissue when you cough or sneeze, or you can cough or sneeze into your sleeve. Throw used tissues in a lined trash can, and immediately wash your hands with soap and water for at least 20 seconds or use an alcohol-based hand rub.  Wash your [facility] your hands often and thoroughly with soap and water for at least 20 seconds. You can use an alcohol-based hand sanitizer if soap and water are not available and if your hands are not visibly dirty. Avoid touching your eyes, nose, and mouth with unwashed hands.   Prevention Steps for Caregivers and Household Members of Individuals Confirmed to have, or Being Evaluated for, COVID-19 Infection Being Cared for in the Home  If you live with, or provide care at home for, a person confirmed to have, or being evaluated for, COVID-19 infection please follow these guidelines to prevent infection:  Follow healthcare providers instructions Make sure that you understand and can help the patient follow any healthcare provider instructions for all care.  Provide for the patients basic needs You should help the patient with basic needs in the home and provide support for getting groceries, prescriptions, and other personal needs.  Monitor the patients symptoms If  they are getting sicker, call his or her medical provider and tell them that the patient has, or is being evaluated for, COVID-19 infection. This will help the healthcare providers office take steps to keep other people from getting infected. Ask the healthcare provider to call the local  or state health department.  Limit the number of people who have contact with the patient  If possible, have only one caregiver for the patient.  Other household members should stay in another home or place of residence. If this is not possible, they should stay  in another room, or be separated from the patient as much as possible. Use a separate bathroom, if available.  Restrict visitors who do not have an essential need to be in the home.  Keep older adults, very young children, and other sick people away from the patient Keep older adults, very young children, and those who have compromised immune systems or chronic health conditions away from the patient. This includes people with chronic heart, lung, or kidney conditions, diabetes, and cancer.  Ensure good ventilation Make sure that shared spaces in the home have good air flow, such as from an air conditioner or an opened window, weather permitting.  Wash your hands often  Wash your hands often and thoroughly with soap and water for at least 20 seconds. You can use an alcohol based hand sanitizer if soap and water are not available and if your hands are not visibly dirty.  Avoid touching your eyes, nose, and mouth with unwashed hands.  Use disposable paper towels to dry your hands. If not available, use dedicated cloth towels and replace them when they become wet.  Wear a facemask and gloves  Wear a disposable facemask at all times in the room and gloves when you touch or have contact with the patients blood, body fluids, and/or secretions or excretions, such as sweat, saliva, sputum, nasal mucus, vomit, urine, or feces.  Ensure the mask fits over your nose and mouth tightly, and do not touch it during use.  Throw out disposable facemasks and gloves after using them. Do not reuse.  Wash your hands immediately after removing your facemask and gloves.  If your personal clothing becomes contaminated, carefully remove clothing  and launder. Wash your hands after handling contaminated clothing.  Place all used disposable facemasks, gloves, and other waste in a lined container before disposing them with other household waste.  Remove gloves and wash your hands immediately after handling these items.  Do not share dishes, glasses, or other household items with the patient  Avoid sharing household items. You should not share dishes, drinking glasses, cups, eating utensils, towels, bedding, or other items with a patient who is confirmed to have, or being evaluated for, COVID-19 infection.  After the person uses these items, you should wash them thoroughly with soap and water.  Wash laundry thoroughly  Immediately remove and wash clothes or bedding that have blood, body fluids, and/or secretions or excretions, such as sweat, saliva, sputum, nasal mucus, vomit, urine, or feces, on them.  Wear gloves when handling laundry from the patient.  Read and follow directions on labels of laundry or clothing items and detergent. In general, wash and dry with the warmest temperatures recommended on the label.  Clean all areas the individual has used often  Clean all touchable surfaces, such as counters, tabletops, doorknobs, bathroom fixtures, toilets, phones, keyboards, tablets, and bedside tables, every day. Also, clean any surfaces that may have blood, body fluids,  and/or secretions or excretions on them.  Wear gloves when cleaning surfaces the patient has come in contact with.  Use a diluted bleach solution (e.g., dilute bleach with 1 part bleach and 10 parts water) or a household disinfectant with a label that says EPA-registered for coronaviruses. To make a bleach solution at home, add 1 tablespoon of bleach to 1 quart (4 cups) of water. For a larger supply, add  cup of bleach to 1 gallon (16 cups) of water.  Read labels of cleaning products and follow recommendations provided on product labels. Labels contain instructions  for safe and effective use of the cleaning product including precautions you should take when applying the product, such as wearing gloves or eye protection and making sure you have good ventilation during use of the product.  Remove gloves and wash hands immediately after cleaning.  Monitor yourself for signs and symptoms of illness Caregivers and household members are considered close contacts, should monitor their health, and will be asked to limit movement outside of the home to the extent possible. Follow the monitoring steps for close contacts listed on the symptom monitoring form.   ? If you have additional questions, contact your local health department or call the epidemiologist on call at [phone] (available 24/7). ? This guidance is subject to change. For the most up-to-date guidance from [facility], please refer to their website: YouBlogs.pl  Information on my medicine - ELIQUIS (apixaban)  This medication education was reviewed with me or my healthcare representative as part of my discharge preparation.  The pharmacist that spoke with me during my hospital stay was:  Onnie Boer, RPH-CPP  Why was Eliquis prescribed for you? Eliquis was prescribed for you to reduce the risk of a blood clot forming that can cause a stroke if you have a medical condition called atrial fibrillation (a type of irregular heartbeat).  What do You need to know about Eliquis ? Take your Eliquis TWICE DAILY - one tablet in the morning and one tablet in the evening with or without food. If you have difficulty swallowing the tablet whole please discuss with your pharmacist how to take the medication safely.  Take Eliquis exactly as prescribed by your doctor and DO NOT stop taking Eliquis without talking to the doctor who prescribed the medication.  Stopping may increase your risk of developing a stroke.  Refill your prescription before you run  out.  After discharge, you should have regular check-up appointments with your healthcare provider that is prescribing your Eliquis.  In the future your dose may need to be changed if your kidney function or weight changes by a significant amount or as you get older.  What do you do if you miss a dose? If you miss a dose, take it as soon as you remember on the same day and resume taking twice daily.  Do not take more than one dose of ELIQUIS at the same time to make up a missed dose.  Important Safety Information A possible side effect of Eliquis is bleeding. You should call your healthcare provider right away if you experience any of the following: ? Bleeding from an injury or your nose that does not stop. ? Unusual colored urine (red or dark brown) or unusual colored stools (red or black). ? Unusual bruising for unknown reasons. ? A serious fall or if you hit your head (even if there is no bleeding).  Some medicines may interact with Eliquis and might increase your risk of bleeding or  clotting while on Eliquis. To help avoid this, consult your healthcare provider or pharmacist prior to using any new prescription or non-prescription medications, including herbals, vitamins, non-steroidal anti-inflammatory drugs (NSAIDs) and supplements.  This website has more information on Eliquis (apixaban): http://www.eliquis.com/eliquis/home

## 2019-01-06 NOTE — Progress Notes (Signed)
I spoke with the patient's daughter over the phone about patient's condition, plan of care and all questions were addressed.

## 2019-01-07 LAB — BASIC METABOLIC PANEL
Anion gap: 8 (ref 5–15)
BUN: 26 mg/dL — ABNORMAL HIGH (ref 8–23)
CO2: 29 mmol/L (ref 22–32)
Calcium: 8.6 mg/dL — ABNORMAL LOW (ref 8.9–10.3)
Chloride: 102 mmol/L (ref 98–111)
Creatinine, Ser: 0.56 mg/dL (ref 0.44–1.00)
GFR calc Af Amer: >60 mL/min (ref >60)
GFR calc non Af Amer: >60 mL/min (ref >60)
Glucose, Bld: 171 mg/dL — ABNORMAL HIGH (ref 70–99)
Potassium: 4.3 mmol/L (ref 3.5–5.1)
Sodium: 139 mmol/L (ref 135–145)

## 2019-01-07 LAB — COMPREHENSIVE METABOLIC PANEL
ALT: 25 U/L (ref 0–44)
AST: 26 U/L (ref 15–41)
Albumin: 3.2 g/dL — ABNORMAL LOW (ref 3.5–5.0)
Alkaline Phosphatase: 66 U/L (ref 38–126)
Anion gap: 8 (ref 5–15)
BUN: 26 mg/dL — ABNORMAL HIGH (ref 8–23)
CO2: 27 mmol/L (ref 22–32)
Calcium: 8.5 mg/dL — ABNORMAL LOW (ref 8.9–10.3)
Chloride: 103 mmol/L (ref 98–111)
Creatinine, Ser: 0.73 mg/dL (ref 0.44–1.00)
GFR calc Af Amer: >60 mL/min (ref >60)
GFR calc non Af Amer: >60 mL/min (ref >60)
Glucose, Bld: 168 mg/dL — ABNORMAL HIGH (ref 70–99)
Potassium: 4.3 mmol/L (ref 3.5–5.1)
Sodium: 138 mmol/L (ref 135–145)
Total Bilirubin: 0.5 mg/dL (ref 0.3–1.2)
Total Protein: 5.6 g/dL — ABNORMAL LOW (ref 6.5–8.1)

## 2019-01-07 LAB — CBC WITH DIFFERENTIAL/PLATELET
Abs Immature Granulocytes: 0.03 10*3/uL (ref 0.00–0.07)
Basophils Absolute: 0 10*3/uL (ref 0.0–0.1)
Basophils Relative: 0 %
Eosinophils Absolute: 0 10*3/uL (ref 0.0–0.5)
Eosinophils Relative: 0 %
HCT: 41.5 % (ref 36.0–46.0)
Hemoglobin: 13.9 g/dL (ref 12.0–15.0)
Immature Granulocytes: 1 %
Lymphocytes Relative: 20 %
Lymphs Abs: 0.7 10*3/uL (ref 0.7–4.0)
MCH: 31.3 pg (ref 26.0–34.0)
MCHC: 33.5 g/dL (ref 30.0–36.0)
MCV: 93.5 fL (ref 80.0–100.0)
Monocytes Absolute: 0.3 10*3/uL (ref 0.1–1.0)
Monocytes Relative: 9 %
Neutro Abs: 2.6 10*3/uL (ref 1.7–7.7)
Neutrophils Relative %: 70 %
Platelets: 219 10*3/uL (ref 150–400)
RBC: 4.44 MIL/uL (ref 3.87–5.11)
RDW: 13.9 % (ref 11.5–15.5)
WBC: 3.7 10*3/uL — ABNORMAL LOW (ref 4.0–10.5)
nRBC: 0 % (ref 0.0–0.2)

## 2019-01-07 LAB — D-DIMER, QUANTITATIVE: D-Dimer, Quant: 0.35 ug/mL-FEU (ref 0.00–0.50)

## 2019-01-07 LAB — FERRITIN: Ferritin: 95 ng/mL (ref 11–307)

## 2019-01-07 LAB — C-REACTIVE PROTEIN: CRP: <0.8 mg/dL (ref <1.0)

## 2019-01-07 NOTE — Progress Notes (Addendum)
PROGRESS NOTE    Heather [name]  DXI:[id number] DOB: 1926-07-03 DOA: 01/05/2019 PCP: Burnard Bunting, MD    Brief Narrative:  85 year old female who presented with fever.  She does have significant past medical history for paroxysmal atrial fibrillation, COPD and hypertension.  Patient reported 3 days of fever, fatigue, sore throat, runny nose and body aches.  Her dyspnea has been stable but she had noticed increased sputum production along with cough.  She was seen by her primary care physician who found her hypoxic and sent her to the emergency room.  On initial physical examination patient was afebrile, her blood pressure was 90/50, her oxygen saturation was 92 to 93%, at rest, on ambulation 88%, she had moist mucous membranes, her lungs are clear to auscultation bilaterally, positive dyspnea, heart S1-S2 present rhythm, abdomen soft, positive pedal edema bilaterally. Sodium 136, potassium 3.7, chloride 96 bicarb 20, glucose 105, BUN 28, creatinine 0.89, white count 5.0, hemoglobin 14.4, hematocrit 43.9, platelets 219.  Chest radiograph had hyperinflation no infiltrates.  82 bpm, normal axis, normal intervals, sinus rhythm, no ST segment T wave changes.  Patient was admitted to the hospital with a working diagnosis of hypotension and hypoxemia in the setting of SARS COVID-19 infection.  Patient has persistent hypoxemia down to 70-88% on room air, she has baseline COPD. Pretest probability for viral pneumonia is high, patient with significant risk factors for poor outcomes, including elderly, deconditioned and baseline COPD, Patient placed on Remdesivir and Dexamethasone.  Further work up with CT chest showed very faint isolated ground glass opacitie anterior right lower lobe along with significant emphysema. Positive 16x12 mm new nodular focus at medial right middle lobe base adjacent to the mediastinum, possible developing mass.    Assessment & Plan:   Principal Problem:   Acute  respiratory failure with hypoxemia (HCC) Active Problems:   Ventral hernia   PAF (paroxysmal atrial fibrillation) (HCC)   Pulmonary emphysema (HCC)   HTN (hypertension)   Hypotension   Pneumonia due to COVID-19 virus   1. Acute hypoxic respiratory failure due to SARS COVID 19 pneumonia in the setting of emphysema/ COPD. Patient looks very deconditioned with positive dyspnea at rest worse with minimal efforts. Her oxygenation is 98% on 1.5 LPM per [place] and respiratory rate 20 bpm.  Ferritin 95 CRP <0.8 D dimer 0.35  Will continue medical therapy with Remdesivir and systemic steroids with dexamethasone. Patient continue to be high risk for decompensation due to COPD/ Emphysema.   No clinical signs of COPD exacerbation currently. Continue bronchodilator therapy.   2. Hypotension due to volume depletion. Her blod pressure has remain stable at 124/79 mmHg.  Continue to hold on IV fluids and antihypertensive medications.  3. Paroxysmal atrial fibrillation. Her heart rate 85 bpm, continue control with diltiazem and apixaban for anticoagulation.   5. Protein calorie malnutrition/ undetermined severity. On nutritional supplements.   6. Hypothyroid. On levothyroxine.   7. New right middle lobe developing mass 16x12 mm. Will need outpatient followup.   DVT prophylaxis: apixaban   Code Status:  full Family Communication: no family at the bedside  Disposition Plan/ discharge barriers: pending clinical improvement       Body mass index is 17.36 kg/m. Malnutrition Type:  Nutrition Problem: Increased nutrient needs Etiology: (COVID-19 PNA)   Malnutrition Characteristics:  Signs/Symptoms: estimated needs   Nutrition Interventions:  Interventions: Refer to RD note for recommendations  RN Pressure Injury Documentation:     Consultants:     Procedures:  Antimicrobials:      Subjective: Patient reports no change in dyspnea, but looks dyspneic at rest in  bed. No nausea or vomiting, no chest pain. Her husband has been hospitalized for COVID 19.   Objective: Vitals:   01/06/19 1140 01/06/19 1648 01/06/19 2105 01/07/19 0530  BP:  120/72 126/77 125/75  Pulse: 80 65 87 66  Resp: (!) 24 20 18 18   Temp:  (!) 97.4 F (36.3 C) 97.6 F (36.4 C) 97.6 F (36.4 C)  TempSrc:  Oral Oral Oral  SpO2: 98% 96% 96% 97%  Weight:      Height:        Intake/Output Summary (Last 24 hours) at 01/07/2019 0828 Last data filed at 01/06/2019 1800 Gross per 24 hour  Intake 1131.77 ml  Output -  Net 1131.77 ml   Filed Weights   01/06/19 0250  Weight: 44.5 kg    Examination:   General: deconditioned and ill looking appearing.  Neurology: Awake and alert, non focal  E ENT: mild pallor, no icterus, oral mucosa moist Cardiovascular: No JVD. S1-S2 present, rhythmic, no gallops, rubs, or murmurs. No lower extremity edema. Pulmonary: positive breath sounds bilaterally, decreased air movement.  Gastrointestinal. Abdomen flat, no organomegaly, non tender, no rebound or guarding Skin. No rashes Musculoskeletal: no joint deformities     Data Reviewed: I have personally reviewed following labs and imaging studies  CBC: Recent Labs  Lab 01/05/19 1550 01/07/19 0525  WBC 5.0 3.7*  NEUTROABS 3.2 2.6  HGB 14.4 13.9  HCT 43.9 41.5  MCV 94.4 93.5  PLT 219 782   Basic Metabolic Panel: Recent Labs  Lab 01/05/19 1550 01/07/19 0525  NA 136 139  K 3.7 4.3  CL 96* 102  CO2 28 29  GLUCOSE 105* 171*  BUN 28* 26*  CREATININE 0.89 0.56  CALCIUM 9.3 8.6*   GFR: Estimated Creatinine Clearance: 33.5 mL/min (by C-G formula based on SCr of 0.56 mg/dL). Liver Function Tests: Recent Labs  Lab 01/05/19 1550  AST 30  ALT 27  ALKPHOS 82  BILITOT 0.7  PROT 6.5  ALBUMIN 3.9   No results for input(s): LIPASE, AMYLASE in the last 168 hours. No results for input(s): AMMONIA in the last 168 hours. Coagulation Profile: No results for input(s): INR, PROTIME  in the last 168 hours. Cardiac Enzymes: No results for input(s): CKTOTAL, CKMB, CKMBINDEX, TROPONINI in the last 168 hours. BNP (last 3 results) No results for input(s): PROBNP in the last 8760 hours. HbA1C: No results for input(s): HGBA1C in the last 72 hours. CBG: No results for input(s): GLUCAP in the last 168 hours. Lipid Profile: No results for input(s): CHOL, HDL, LDLCALC, TRIG, CHOLHDL, LDLDIRECT in the last 72 hours. Thyroid Function Tests: No results for input(s): TSH, T4TOTAL, FREET4, T3FREE, THYROIDAB in the last 72 hours. Anemia Panel: Recent Labs    01/07/19 0525  FERRITIN 95      Radiology Studies: I have reviewed all of the imaging during this hospital visit personally     Scheduled Meds: . apixaban  2.5 mg Oral BID  . dexamethasone (DECADRON) injection  6 mg Intravenous Q breakfast  . diltiazem  180 mg Oral Daily  . feeding supplement (ENSURE ENLIVE)  237 mL Oral BID BM  . levothyroxine  137 mcg Oral Q0600  . mometasone-formoterol  2 puff Inhalation BID   Continuous Infusions: . remdesivir 100 mg in NS 250 mL       LOS: 2 days   Gerome Apley, MD

## 2019-01-07 NOTE — Progress Notes (Addendum)
Notified daughter via voicemail of how mother was feeling this evening, will encourage PO intake this evening and offer Ensure and fruits.  This nurse's contact number shared for further communication.

## 2019-01-07 NOTE — Progress Notes (Signed)
I spoke over the phone with the patient's daughter about patient's condition, plan of care and all questions were addressed.

## 2019-01-08 LAB — COMPREHENSIVE METABOLIC PANEL
ALT: 26 U/L (ref 0–44)
AST: 25 U/L (ref 15–41)
Albumin: 3.2 g/dL — ABNORMAL LOW (ref 3.5–5.0)
Alkaline Phosphatase: 66 U/L (ref 38–126)
Anion gap: 6 (ref 5–15)
BUN: 29 mg/dL — ABNORMAL HIGH (ref 8–23)
CO2: 30 mmol/L (ref 22–32)
Calcium: 8.9 mg/dL (ref 8.9–10.3)
Chloride: 107 mmol/L (ref 98–111)
Creatinine, Ser: 0.65 mg/dL (ref 0.44–1.00)
GFR calc Af Amer: >60 mL/min (ref >60)
GFR calc non Af Amer: >60 mL/min (ref >60)
Glucose, Bld: 162 mg/dL — ABNORMAL HIGH (ref 70–99)
Potassium: 5.4 mmol/L — ABNORMAL HIGH (ref 3.5–5.1)
Sodium: 143 mmol/L (ref 135–145)
Total Bilirubin: 0.3 mg/dL (ref 0.3–1.2)
Total Protein: 5.5 g/dL — ABNORMAL LOW (ref 6.5–8.1)

## 2019-01-08 LAB — D-DIMER, QUANTITATIVE: D-Dimer, Quant: 0.37 ug/mL-FEU (ref 0.00–0.50)

## 2019-01-08 LAB — POTASSIUM: Potassium: 4.2 mmol/L (ref 3.5–5.1)

## 2019-01-08 LAB — C-REACTIVE PROTEIN: CRP: 0.9 mg/dL (ref <1.0)

## 2019-01-08 LAB — FERRITIN: Ferritin: 92 ng/mL (ref 11–307)

## 2019-01-08 MED ORDER — IPRATROPIUM-ALBUTEROL 20-100 MCG/ACT IN AERS
1.0000 | INHALATION_SPRAY | Freq: Four times a day (QID) | RESPIRATORY_TRACT | Status: DC
  Administered 2019-01-08 – 2019-01-11 (×11): 1 via RESPIRATORY_TRACT
  Filled 2019-01-08: qty 4

## 2019-01-08 MED ORDER — GUAIFENESIN-DM 100-10 MG/5ML PO SYRP
5.0000 mL | ORAL_SOLUTION | Freq: Four times a day (QID) | ORAL | Status: DC
  Administered 2019-01-08 – 2019-01-11 (×11): 5 mL via ORAL
  Filled 2019-01-08 (×11): qty 10

## 2019-01-08 MED ORDER — ALBUTEROL SULFATE HFA 108 (90 BASE) MCG/ACT IN AERS: 2.0000 | INHALATION_SPRAY | Freq: Four times a day (QID) | RESPIRATORY_TRACT | Status: DC | PRN

## 2019-01-08 NOTE — Progress Notes (Signed)
PROGRESS NOTE    Heather [name]  RCV:[id number] DOB: Apr 19, 1926 DOA: 01/05/2019 PCP: Burnard Bunting, MD    Brief Narrative:  85 year old female who presented with fever.She does have significant past medical history for paroxysmal atrial fibrillation, COPD and hypertension. Patient reported 3 days of fever, fatigue, sore throat, runny nose and body aches. Her dyspnea has been stable but she had noticed increased sputum production along with cough. She was seen by her primary care physician who found her hypoxic and sent her to the emergency room. On initial physical examination patient was afebrile, her blood pressure was 90/50, her oxygen saturation was 92 to 93%, at rest, on ambulation 88%, she had moist mucous membranes, her lungs are clear to auscultation bilaterally, positive dyspnea, heart S1-S2 present rhythm, abdomen soft, positive pedal edema bilaterally. Sodium 136, potassium 3.7, chloride 96 bicarb 20, glucose 105, BUN 28, creatinine 0.89, white count 5.0, hemoglobin 14.4,hematocrit 43.9, platelets 219. Chest radiograph had hyperinflation no infiltrates. 82 bpm, normal axis, normal intervals, sinus rhythm, no ST segment T wave changes.  Patient was admitted to the hospital with a working diagnosis of hypotension and hypoxemia in the setting of SARS COVID-19 infection.  Patient on admission had persistent hypoxemia down to 70-88% on room air, she has baseline COPD. Pretest probability for viral pneumonia was high, patient with significant risk factors for poor outcomes, including elderly, deconditioned and baseline COPD. Patient placed on Remdesivir and Dexamethasone.  Further work up with CT chest showed very faint isolated ground glass opacity at the anterior right lower lobe along with significant emphysema. Positive 16x12 mm new nodular focus at medial right middle lobe base adjacent to the mediastinum, possible developing mass.   Through hospitalization patient  continue to be deconditioned, but improved oxygen requirements.   Assessment & Plan:   Principal Problem:   Acute respiratory failure with hypoxemia (HCC) Active Problems:   Ventral hernia   PAF (paroxysmal atrial fibrillation) (HCC)   Pulmonary emphysema (HCC)   HTN (hypertension)   Hypotension   Pneumonia due to COVID-19 virus    1. Acute hypoxic respiratory failure due to SARS COVID 19 pneumonia in the setting of emphysema/ COPD with acute exacerbation. Patient has remained stable, with persistent dyspnea, her oxygen requirements have improved, but not back to baseline.    Fi02: 24 to 28%, 1 to 1,5 LPM per [place] Pulse oxymetry: 97 to 98%  Ferritin 95>92  CRP <0.8, 0.9 D dimer 0.35<0.37  On Remdesivir #3/5, continue with systemic steroids with dexamethasone while hospitalized. She reports worsening cough and sputum production, consistent with COPD exacerbation. Continue with bronchodilator therapy.    2. Hypotension due to volume depletion. Stable blood pressure 141 to 148 mmHg, will continue close monitoring.   3. Paroxysmal atrial fibrillation. Continue with rate control with  diltiazem and anticoagulation with apixaban.   5. Protein calorie malnutrition/ undetermined severity. Continue with nutritional supplements. Patient with very poor oral intake.   6. Hypothyroid. On levothyroxine.  7. New right middle lobe developing mass 16x12 mm.  For outpatient followup.    Body mass index is 17.36 kg/m. Malnutrition Type:  Nutrition Problem: Increased nutrient needs Etiology: (COVID-19 PNA)   Malnutrition Characteristics:  Signs/Symptoms: estimated needs   Nutrition Interventions:  Interventions: Refer to RD note for recommendations  RN Pressure Injury Documentation:     Consultants:     Procedures:     Antimicrobials:   Remdesivir     Subjective: Patient continue to be very weak and deconditioned, positive  dyspnea and cough, no nausea or  vomiting. Poor appetite and poor oral intake.   Objective: Vitals:   01/07/19 2035 01/08/19 0110 01/08/19 0515 01/08/19 0741  BP: 126/85  (!) 148/75 (!) 141/72  Pulse: 68 75 70 65  Resp: 19  20   Temp: 97.7 F (36.5 C)  (!) 97.3 F (36.3 C) 97.9 F (36.6 C)  TempSrc: Oral  Oral Oral  SpO2: 96% 96% 97% 98%  Weight:      Height:        Intake/Output Summary (Last 24 hours) at 01/08/2019 1029 Last data filed at 01/07/2019 1300 Gross per 24 hour  Intake 360 ml  Output -  Net 360 ml   Filed Weights   01/06/19 0250  Weight: 44.5 kg    Examination:   General: deconditioned and ill looking appearing  Neurology: Awake and alert, non focal  E ENT: mild pallor, no icterus, oral mucosa moist Cardiovascular: No JVD. S1-S2 present, rhythmic, no gallops, rubs, or murmurs. No lower extremity edema. Pulmonary: positive breath sounds bilaterally. Gastrointestinal. Abdomen with no organomegaly, non tender, no rebound or guarding Skin. No rashes Musculoskeletal: no joint deformities     Data Reviewed: I have personally reviewed following labs and imaging studies  CBC: Recent Labs  Lab 01/05/19 1550 01/07/19 0525  WBC 5.0 3.7*  NEUTROABS 3.2 2.6  HGB 14.4 13.9  HCT 43.9 41.5  MCV 94.4 93.5  PLT 219 027   Basic Metabolic Panel: Recent Labs  Lab 01/05/19 1550 01/07/19 0525 01/08/19 0015  NA 136 138  139 143  K 3.7 4.3  4.3 5.4*  CL 96* 103  102 107  CO2 28 27  29 30   GLUCOSE 105* 168*  171* 162*  BUN 28* 26*  26* 29*  CREATININE 0.89 0.73  0.56 0.65  CALCIUM 9.3 8.5*  8.6* 8.9   GFR: Estimated Creatinine Clearance: 33.5 mL/min (by C-G formula based on SCr of 0.65 mg/dL). Liver Function Tests: Recent Labs  Lab 01/05/19 1550 01/07/19 0525 01/08/19 0015  AST 30 26 25   ALT 27 25 26   ALKPHOS 82 66 66  BILITOT 0.7 0.5 0.3  PROT 6.5 5.6* 5.5*  ALBUMIN 3.9 3.2* 3.2*   No results for input(s): LIPASE, AMYLASE in the last 168 hours. No results for  input(s): AMMONIA in the last 168 hours. Coagulation Profile: No results for input(s): INR, PROTIME in the last 168 hours. Cardiac Enzymes: No results for input(s): CKTOTAL, CKMB, CKMBINDEX, TROPONINI in the last 168 hours. BNP (last 3 results) No results for input(s): PROBNP in the last 8760 hours. HbA1C: No results for input(s): HGBA1C in the last 72 hours. CBG: No results for input(s): GLUCAP in the last 168 hours. Lipid Profile: No results for input(s): CHOL, HDL, LDLCALC, TRIG, CHOLHDL, LDLDIRECT in the last 72 hours. Thyroid Function Tests: No results for input(s): TSH, T4TOTAL, FREET4, T3FREE, THYROIDAB in the last 72 hours. Anemia Panel: Recent Labs    01/07/19 0525 01/08/19 0015  FERRITIN 95 92      Radiology Studies: I have reviewed all of the imaging during this hospital visit personally     Scheduled Meds: . apixaban  2.5 mg Oral BID  . dexamethasone (DECADRON) injection  6 mg Intravenous Q breakfast  . diltiazem  180 mg Oral Daily  . feeding supplement (ENSURE ENLIVE)  237 mL Oral BID BM  . levothyroxine  137 mcg Oral Q0600  . mometasone-formoterol  2 puff Inhalation BID   Continuous Infusions: .  remdesivir 100 mg in NS 250 mL Stopped (01/07/19 0939)     LOS: 3 days         Gerome Apley, MD

## 2019-01-08 NOTE — Progress Notes (Addendum)
Messaged MD regarding potassium level of 5.4  12:17 Attempted to call patient's daughter [name]. No answer. Message left with call back number.   13:45 Offered to sit patient up in the chair multiple times this shift. Patient refused. Patient has been up to the bedside commode and stood multiple times while bathing. Patient requesting to rest today. Patient states she has spoken to her daughter on the phone and has updated her. Notified patient that this writer is happy to answer any additional questions [name] may have.

## 2019-01-08 NOTE — Progress Notes (Signed)
I spoke over the phone with the patient's daughter about patient's condition, plan of care and all questions were addressed.

## 2019-01-09 LAB — COMPREHENSIVE METABOLIC PANEL
ALT: 30 U/L (ref 0–44)
AST: 26 U/L (ref 15–41)
Albumin: 3.1 g/dL — ABNORMAL LOW (ref 3.5–5.0)
Alkaline Phosphatase: 59 U/L (ref 38–126)
Anion gap: 8 (ref 5–15)
BUN: 27 mg/dL — ABNORMAL HIGH (ref 8–23)
CO2: 29 mmol/L (ref 22–32)
Calcium: 8.5 mg/dL — ABNORMAL LOW (ref 8.9–10.3)
Chloride: 105 mmol/L (ref 98–111)
Creatinine, Ser: 0.54 mg/dL (ref 0.44–1.00)
GFR calc Af Amer: >60 mL/min (ref >60)
GFR calc non Af Amer: >60 mL/min (ref >60)
Glucose, Bld: 111 mg/dL — ABNORMAL HIGH (ref 70–99)
Potassium: 3.8 mmol/L (ref 3.5–5.1)
Sodium: 142 mmol/L (ref 135–145)
Total Bilirubin: 0.9 mg/dL (ref 0.3–1.2)
Total Protein: 5.2 g/dL — ABNORMAL LOW (ref 6.5–8.1)

## 2019-01-09 LAB — D-DIMER, QUANTITATIVE: D-Dimer, Quant: 0.51 ug/mL-FEU — ABNORMAL HIGH (ref 0.00–0.50)

## 2019-01-09 LAB — C-REACTIVE PROTEIN: CRP: <0.8 mg/dL (ref <1.0)

## 2019-01-09 LAB — FERRITIN: Ferritin: 83 ng/mL (ref 11–307)

## 2019-01-09 NOTE — Progress Notes (Signed)
PROGRESS NOTE    Heather [name]  MHD:[id number] DOB: [birth year] DOA: 01/05/2019 PCP: Burnard Bunting, MD    Brief Narrative:  85 year old female who presented with fever.She does have significant past medical history for paroxysmal atrial fibrillation, COPD and hypertension. Patient reported 3 days of fever, fatigue, sore throat, runny nose and body aches. Her dyspnea has been stable but she had noticed increased sputum production along with cough. She was seen by her primary care physician who found her hypoxic and sent her to the emergency room. On initial physical examination patient was afebrile, her blood pressure was 90/50, her oxygen saturation was 92 to 93%, at rest, on ambulation 88%, she had moist mucous membranes, her lungs are clear to auscultation bilaterally, positive dyspnea, heart S1-S2 present rhythm, abdomen soft, positive pedal edema bilaterally. Sodium 136, potassium 3.7, chloride 96 bicarb 20, glucose 105, BUN 28, creatinine 0.89, white count 5.0, hemoglobin 14.4,hematocrit 43.9, platelets 219. Chest radiograph had hyperinflation no infiltrates. 82 bpm, normal axis, normal intervals, sinus rhythm, no ST segment T wave changes.  Patient was admitted to the hospital with a working diagnosis of hypotension and hypoxemia in the setting of SARS COVID-19 infection.  Patient on admission had persistent hypoxemia down to 70-88% on room air, she has baseline COPD. Pretest probability for viral pneumonia was high, patient with significant risk factors for poor outcomes, including elderly, deconditioned and baseline COPD.Patient placed onRemdesivir and Dexamethasone.  Further work up with CT chest showed very faint isolated ground glass opacity at the anterior right lower lobe along with significant emphysema. Positive16x12 mm new nodular focus at medial right middle lobe base adjacent to the mediastinum, possible developing mass.  Through hospitalization patient  continue to be deconditioned, but improved oxygen requirements.     Assessment & Plan:   Principal Problem:   Acute respiratory failure with hypoxemia (HCC) Active Problems:   Ventral hernia   PAF (paroxysmal atrial fibrillation) (HCC)   Pulmonary emphysema (HCC)   HTN (hypertension)   Hypotension   Pneumonia due to COVID-19 virus   1. Acute hypoxic respiratory failure due to SARS COVID 19 pneumoniain the setting of emphysema/ COPD with acute exacerbation. continue to be dyspneic with minimal efforts, despite bronchodilator therapy. Very weak and deconditioned, poor oral intake. Oxygen requirements have decrease to 1, LPM from 1,5 LPM.    Fi02: 24%, 1 LPM per [place] Pulse oxymetry: 97%  Ferritin 95>92>83  CRP <0.8, 0.9< 0.8 D dimer 0.35<0.37<0.51  Continue with Remdesivir #4/5. Systemic steroids with dexamethasone IV while hospitalized. Continue with bronchodilator therapy.    2. Hypotension due to volume depletion. Patient with very poor oral intake, blood pressure has remained stable.  3. Paroxysmal atrial fibrillation.rate controlled with diltiazemand anticoagulation with apixaban.   5. Protein calorie malnutrition/ undetermined severity. On nutritional supplements. Patient with persistent very poor oral intake.   6. Hypothyroid.Continue with oral levothyroxine.  7. New right middle lobe developing mass 16x12 mm.  Will need outpatient followup.    Body mass index is 17.36 kg/m. Malnutrition Type:  Nutrition Problem: Increased nutrient needs Etiology: (COVID-19 PNA)   Malnutrition Characteristics:  Signs/Symptoms: estimated needs   Nutrition Interventions:  Interventions: Refer to RD note for recommendations  RN Pressure Injury Documentation:     Consultants:     Procedures:     Antimicrobials:   Remdesivir.     Subjective: Patient continue to be fatigued and deconditioned, positive dyspnea with minimal efforts, poor appetite,  no nausea or vomiting.   Objective: Vitals:  01/08/19 1639 01/08/19 2000 01/09/19 0430 01/09/19 0827  BP: (!) 115/54 127/68 119/73 138/84  Pulse: 65 68 69 72  Resp: (!) 22 20 20 20   Temp: 97.8 F (36.6 C) 98 F (36.7 C) 98 F (36.7 C) (!) 97.5 F (36.4 C)  TempSrc: Oral Oral Oral Oral  SpO2: 97% 98% 95% 97%  Weight:      Height:        Intake/Output Summary (Last 24 hours) at 01/09/2019 0855 Last data filed at 01/09/2019 0529 Gross per 24 hour  Intake 1070 ml  Output 300 ml  Net 770 ml   Filed Weights   01/06/19 0250  Weight: 44.5 kg    Examination:   General: Not in pain or dyspnea, deconditioned  Neurology: Awake and alert, non focal  E ENT: mild pallor, no icterus, oral mucosa moist Cardiovascular: No JVD. S1-S2 present, rhythmic, no gallops, rubs, or murmurs. No lower extremity edema. Pulmonary: positive breath sounds bilaterally.  Gastrointestinal. Abdomen with no organomegaly, non tender, no rebound or guarding Skin. No rashes Musculoskeletal: no joint deformities     Data Reviewed: I have personally reviewed following labs and imaging studies  CBC: Recent Labs  Lab 01/05/19 1550 01/07/19 0525  WBC 5.0 3.7*  NEUTROABS 3.2 2.6  HGB 14.4 13.9  HCT 43.9 41.5  MCV 94.4 93.5  PLT 219 161   Basic Metabolic Panel: Recent Labs  Lab 01/05/19 1550 01/07/19 0525 01/08/19 0015 01/08/19 1415  NA 136 138  139 143  --   K 3.7 4.3  4.3 5.4* 4.2  CL 96* 103  102 107  --   CO2 28 27  29 30   --   GLUCOSE 105* 168*  171* 162*  --   BUN 28* 26*  26* 29*  --   CREATININE 0.89 0.73  0.56 0.65  --   CALCIUM 9.3 8.5*  8.6* 8.9  --    GFR: Estimated Creatinine Clearance: 33.5 mL/min (by C-G formula based on SCr of 0.65 mg/dL). Liver Function Tests: Recent Labs  Lab 01/05/19 1550 01/07/19 0525 01/08/19 0015  AST 30 26 25   ALT 27 25 26   ALKPHOS 82 66 66  BILITOT 0.7 0.5 0.3  PROT 6.5 5.6* 5.5*  ALBUMIN 3.9 3.2* 3.2*   No results for  input(s): LIPASE, AMYLASE in the last 168 hours. No results for input(s): AMMONIA in the last 168 hours. Coagulation Profile: No results for input(s): INR, PROTIME in the last 168 hours. Cardiac Enzymes: No results for input(s): CKTOTAL, CKMB, CKMBINDEX, TROPONINI in the last 168 hours. BNP (last 3 results) No results for input(s): PROBNP in the last 8760 hours. HbA1C: No results for input(s): HGBA1C in the last 72 hours. CBG: No results for input(s): GLUCAP in the last 168 hours. Lipid Profile: No results for input(s): CHOL, HDL, LDLCALC, TRIG, CHOLHDL, LDLDIRECT in the last 72 hours. Thyroid Function Tests: No results for input(s): TSH, T4TOTAL, FREET4, T3FREE, THYROIDAB in the last 72 hours. Anemia Panel: Recent Labs    01/07/19 0525 01/08/19 0015  FERRITIN 95 92      Radiology Studies: I have reviewed all of the imaging during this hospital visit personally     Scheduled Meds: . apixaban  2.5 mg Oral BID  . dexamethasone (DECADRON) injection  6 mg Intravenous Q breakfast  . diltiazem  180 mg Oral Daily  . feeding supplement (ENSURE ENLIVE)  237 mL Oral BID BM  . guaiFENesin-dextromethorphan  5 mL Oral Q6H  . Ipratropium-Albuterol  1 puff Inhalation QID  . levothyroxine  137 mcg Oral Q0600  . mometasone-formoterol  2 puff Inhalation BID   Continuous Infusions: . remdesivir 100 mg in NS 250 mL Stopped (01/08/19 1110)     LOS: 4 days        Dona Klemann Gerome Apley, MD

## 2019-01-09 NOTE — TOC Initial Note (Signed)
Transition of Care [facility]) - Initial/Assessment Note    Patient Details  Name: Heather [name] MRN: [id number] Date of Birth: 21-May-1926  Transition of Care [facility]) CM/SW Contact:    Ninfa Meeker, RN Phone Number: 01/09/2019, 1:36 PM  Clinical Narrative  : is a 85 y.o. female with medical history significant of paroxysmal atrial fibrillation on Eliquis, COPD, hypertension who presented to ED with concerns of fever and increasing fatigue.Patient HOH. Daughter is contact. [place] and then 3 days after she began to noticed elevated temperature, fatigue, sore throat, runny nose and muscle aches. Patient is on RA, started on REmdesivir and IV steroids.  Case manager will continue to monitor for needs as patient medically improves, may she be blessed to do so.        Patient Goals and CMS Choice        Expected Discharge Plan and Services                                                Prior Living Arrangements/Services                       Activities of Daily Living Home Assistive Devices/Equipment: Cane (specify quad or straight), Eyeglasses, Hearing aid, Walker (specify type) ADL Screening (condition at time of admission) Patient's cognitive ability adequate to safely complete daily activities?: Yes Is the patient deaf or have difficulty hearing?: Yes Does the patient have difficulty seeing, even when wearing glasses/contacts?: Yes Does the patient have difficulty concentrating, remembering, or making decisions?: No Patient able to express need for assistance with ADLs?: Yes Does the patient have difficulty dressing or bathing?: Yes Independently performs ADLs?: Yes (appropriate for developmental age) Does the patient have difficulty walking or climbing stairs?: Yes Weakness of Legs: Both Weakness of Arms/Hands: None  Permission Sought/Granted                  Emotional Assessment              Admission  diagnosis:  COPD exacerbation ([place]) [J44.1] Patient Active Problem List   Diagnosis Date Noted  . Acute respiratory failure with hypoxemia ([place]) 01/06/2019  . Pneumonia due to COVID-19 virus 01/06/2019  . Hypotension 01/05/2019  . Acute respiratory failure with hypoxia ([place]) 04/27/2018  . Protein-calorie malnutrition, severe 04/26/2018  . Skin avulsion 09/19/2016  . Influenza B 04/16/2016  . Sepsis ([place]) 04/16/2016  . Hypokalemia 04/16/2016  . Hypothyroidism 04/16/2016  . Post herpetic neuralgia 06/06/2014  . Shingles outbreak 04/19/2014  . Gallstones 09/20/2013  . Community acquired pneumonia 05/17/2013  . COPD with acute exacerbation ([place]) 05/17/2013  . Dyspnea on exertion 04/25/2013  . Pulmonary emphysema ([place]) 11/15/2012  . HTN (hypertension) 11/15/2012  . PAF (paroxysmal atrial fibrillation) ([place]) 06/14/2012  . Long term current use of anticoagulant therapy 06/14/2012  . Ventral hernia 07/31/2011  . Abdominal wall mass 07/01/2011   PCP:  Burnard Bunting, MD Pharmacy:   [facility] [address] [place]), [place] - [address] [place] ([place]) [place] [address] Phone: [phone] Fax: [phone]  [facility] [place], [place] [address] Phone: [phone] Fax: [phone]     Social Determinants of Health (SDOH) Interventions    Readmission  Risk Interventions No flowsheet data found.

## 2019-01-09 NOTE — Plan of Care (Signed)
Pt slept intermittently overnight. No complaints of pain verbalized. Alert and oriented. Vitals stable on 1L O2 via nasal cannula, maintained for comfort as pt still complaining of dyspnea on  minimal exertion although sats remain >95%. Minimal assist with ADLs. Assistance x1 to the [facility] for toileting needs. Skin assessed,  IV  SL. Minimal assistance needed for regular repositioning for pressure relief. Notable to tolerate prone positioning overnight. No other issues, will monitor.   Problem: Education: Goal: Knowledge of risk factors and measures for prevention of condition will improve Outcome: Progressing   Problem: Coping: Goal: Psychosocial and spiritual needs will be supported Outcome: Progressing   Problem: Respiratory: Goal: Will maintain a patent airway Outcome: Progressing Goal: Complications related to the disease process, condition or treatment will be avoided or minimized Outcome: Progressing

## 2019-01-09 NOTE — Plan of Care (Signed)
  Problem: Education: Goal: Knowledge of risk factors and measures for prevention of condition will improve Outcome: Adequate for Discharge   Problem: Coping: Goal: Psychosocial and spiritual needs will be supported Outcome: Adequate for Discharge   Problem: Respiratory: Goal: Will maintain a patent airway Outcome: Progressing Goal: Complications related to the disease process, condition or treatment will be avoided or minimized Outcome: Adequate for Discharge

## 2019-01-09 NOTE — Progress Notes (Signed)
Patient deferred family update phone call. 

## 2019-01-09 NOTE — Progress Notes (Signed)
[name] updated

## 2019-01-10 LAB — COMPREHENSIVE METABOLIC PANEL
ALT: 40 U/L (ref 0–44)
AST: 31 U/L (ref 15–41)
Albumin: 3 g/dL — ABNORMAL LOW (ref 3.5–5.0)
Alkaline Phosphatase: 64 U/L (ref 38–126)
Anion gap: 9 (ref 5–15)
BUN: 27 mg/dL — ABNORMAL HIGH (ref 8–23)
CO2: 27 mmol/L (ref 22–32)
Calcium: 8.6 mg/dL — ABNORMAL LOW (ref 8.9–10.3)
Chloride: 104 mmol/L (ref 98–111)
Creatinine, Ser: 0.51 mg/dL (ref 0.44–1.00)
GFR calc Af Amer: >60 mL/min (ref >60)
GFR calc non Af Amer: >60 mL/min (ref >60)
Glucose, Bld: 150 mg/dL — ABNORMAL HIGH (ref 70–99)
Potassium: 3.9 mmol/L (ref 3.5–5.1)
Sodium: 140 mmol/L (ref 135–145)
Total Bilirubin: 0.5 mg/dL (ref 0.3–1.2)
Total Protein: 5.1 g/dL — ABNORMAL LOW (ref 6.5–8.1)

## 2019-01-10 LAB — C-REACTIVE PROTEIN: CRP: <0.8 mg/dL (ref <1.0)

## 2019-01-10 LAB — D-DIMER, QUANTITATIVE: D-Dimer, Quant: 0.48 ug/mL-FEU (ref 0.00–0.50)

## 2019-01-10 LAB — FERRITIN: Ferritin: 99 ng/mL (ref 11–307)

## 2019-01-10 NOTE — Progress Notes (Signed)
SATURATION QUALIFICATIONS: (This note is used to comply with regulatory documentation for home oxygen)  Patient Saturations on Room Air at Rest = 90%  Patient Saturations on Room Air while Ambulating = 85%  Patient Saturations on 1 Liters of oxygen while Ambulating = 90%  Please briefly explain why patient needs home oxygen:  To maintain oxygen saturation during functional activity.     Barry Brunner, PT

## 2019-01-10 NOTE — Progress Notes (Signed)
[name] updated

## 2019-01-10 NOTE — Progress Notes (Signed)
PROGRESS NOTE    Heather [name]  GEZ:[id number] DOB: January 28, 1927 DOA: 01/05/2019 PCP: Burnard Bunting, MD    Brief Narrative:  85 year old female who presented with fever.She does have significant past medical history for paroxysmal atrial fibrillation, COPD and hypertension. Patient reported 3 days of fever, fatigue, sore throat, runny nose and body aches. Her dyspnea has been stable but she had noticed increased sputum production along with cough. She was seen by her primary care physician who found her hypoxic and sent her to the emergency room. On initial physical examination patient was afebrile, her blood pressure was 90/50, her oxygen saturation was 92 to 93%, at rest, on ambulation 88%, she had moist mucous membranes, her lungs are clear to auscultation bilaterally, positive dyspnea, heart S1-S2 present rhythm, abdomen soft, positive pedal edema bilaterally. Sodium 136, potassium 3.7, chloride 96 bicarb 20, glucose 105, BUN 28, creatinine 0.89, white count 5.0, hemoglobin 14.4,hematocrit 43.9, platelets 219. Chest radiograph had hyperinflation no infiltrates. 82 bpm, normal axis, normal intervals, sinus rhythm, no ST segment T wave changes.  Patient was admitted to the hospital with a working diagnosis of hypotension and hypoxemia in the setting of SARS COVID-19 infection.  [place] admission hadpersistent hypoxemia down to 70-88% on room air, she has baseline COPD. Pretest probability for viral pneumonia washigh, patient with significant risk factors for poor outcomes, including elderly, deconditioned and baseline COPD.Patient placed onRemdesivir and Dexamethasone.  Further work up with CT chest showed very faint isolated ground glass opacity at theanterior right lower lobe along with significant emphysema. Positive16x12 mm new nodular focus at medial right middle lobe base adjacent to the mediastinum, possible developing mass.  Through hospitalization patient  continue to be deconditioned, but improved oxygen requirements.  Pending physical therapy for further disposition.    Assessment & Plan:   Principal Problem:   Acute respiratory failure with hypoxemia (HCC) Active Problems:   Ventral hernia   PAF (paroxysmal atrial fibrillation) (HCC)   Pulmonary emphysema (HCC)   HTN (hypertension)   Hypotension   Pneumonia due to COVID-19 virus   1. Acute hypoxic respiratory failure due to SARS COVID 19 pneumoniain the setting of emphysema/ COPDwith acute exacerbation.Improved dyspnea but continue to have worsening symptoms  with minimal efforts.  Fi02:24%, 1 LPM per [place] Pulse oxymetry:97%  Ferritin 95>92>83<99 CRP <0.8, 0.9< 0.8<0.8 D dimer 0.35<0.37<0.51>0.48  Tolerating well medical therapy with Remdesivir #5/5. Continue with systemic steroids with dexamethasone IV. Continue bronchodilator with albuterol, mometasone, and formoterol.    2. Hypotension due to volume depletion. Resolved.   3. HTN. Blood pressure 147/90 and 129/59, will continue to hold on blood pressure medications for now, patient is very weak and deconditioned, very poor oral intake.   3. Paroxysmal atrial fibrillation.Continue rate controlled withdiltiazem. Anticoagulation with apixaban.  5. Protein calorie malnutrition/ undetermined severity. Continue with nutritional supplements.   6. Hypothyroid.Continue with levothyroxine.  7. New right middle lobe developing mass 16x12 mm. Follow up as outpatient needed      DVT prophylaxis: apixaban   Code Status: full Family Communication: no family at the bedside  Disposition Plan/ discharge barriers: pending clinical improvement and physical therapy evaluation, plan for dc in am.   Body mass index is 17.36 kg/m. Malnutrition Type:  Nutrition Problem: Increased nutrient needs Etiology: (COVID-19 PNA)   Malnutrition Characteristics:  Signs/Symptoms: estimated needs   Nutrition  Interventions:  Interventions: Refer to RD note for recommendations  RN Pressure Injury Documentation:     Consultants:     Procedures:  Antimicrobials:   Remdesivir.     Subjective: Patient continue to feel better, improved cough and dyspnea, very weak and deconditioned, continue to have very poor oral intake. Dyspnea on exertion.   Objective: Vitals:   01/09/19 1708 01/09/19 1955 01/10/19 0540 01/10/19 0821  BP: (!) 105/54 (!) 129/59 (!) 142/71 (!) 147/90  Pulse: 70 70 73 64  Resp: 16   16  Temp: 98.3 F (36.8 C) 97.9 F (36.6 C) 97.9 F (36.6 C) 97.6 F (36.4 C)  TempSrc: Oral Oral Oral Oral  SpO2: 98% 95% 99% 97%  Weight:      Height:        Intake/Output Summary (Last 24 hours) at 01/10/2019 1113 Last data filed at 01/09/2019 1857 Gross per 24 hour  Intake 541.81 ml  Output -  Net 541.81 ml   Filed Weights   01/06/19 0250  Weight: 44.5 kg    Examination:   General: Not in pain or dyspnea, deconditioned  Neurology: Awake and alert, non focal  E ENT: mild pallor, no icterus, oral mucosa moist Cardiovascular: No JVD. S1-S2 present, rhythmic, no gallops, rubs, or murmurs. No lower extremity edema. Pulmonary: positive breath sounds bilaterally, decreased air movement biut no wheezing or rales, no rhonchi. Gastrointestinal. Abdomen with no organomegaly, non tender, no rebound or guarding Skin. No rashes Musculoskeletal: no joint deformities     Data Reviewed: I have personally reviewed following labs and imaging studies  CBC: Recent Labs  Lab 01/05/19 1550 01/07/19 0525  WBC 5.0 3.7*  NEUTROABS 3.2 2.6  HGB 14.4 13.9  HCT 43.9 41.5  MCV 94.4 93.5  PLT 219 208   Basic Metabolic Panel: Recent Labs  Lab 01/05/19 1550 01/07/19 0525 01/08/19 0015 01/08/19 1415 01/09/19 0750 01/10/19 0120  NA 136 138  139 143  --  142 140  K 3.7 4.3  4.3 5.4* 4.2 3.8 3.9  CL 96* 103  102 107  --  105 104  CO2 28 27  29 30   --  29 27   GLUCOSE 105* 168*  171* 162*  --  111* 150*  BUN 28* 26*  26* 29*  --  27* 27*  CREATININE 0.89 0.73  0.56 0.65  --  0.54 0.51  CALCIUM 9.3 8.5*  8.6* 8.9  --  8.5* 8.6*   GFR: Estimated Creatinine Clearance: 33.5 mL/min (by C-G formula based on SCr of 0.51 mg/dL). Liver Function Tests: Recent Labs  Lab 01/05/19 1550 01/07/19 0525 01/08/19 0015 01/09/19 0750 01/10/19 0120  AST 30 26 25 26 31   ALT 27 25 26 30  40  ALKPHOS 82 66 66 59 64  BILITOT 0.7 0.5 0.3 0.9 0.5  PROT 6.5 5.6* 5.5* 5.2* 5.1*  ALBUMIN 3.9 3.2* 3.2* 3.1* 3.0*   No results for input(s): LIPASE, AMYLASE in the last 168 hours. No results for input(s): AMMONIA in the last 168 hours. Coagulation Profile: No results for input(s): INR, PROTIME in the last 168 hours. Cardiac Enzymes: No results for input(s): CKTOTAL, CKMB, CKMBINDEX, TROPONINI in the last 168 hours. BNP (last 3 results) No results for input(s): PROBNP in the last 8760 hours. HbA1C: No results for input(s): HGBA1C in the last 72 hours. CBG: No results for input(s): GLUCAP in the last 168 hours. Lipid Profile: No results for input(s): CHOL, HDL, LDLCALC, TRIG, CHOLHDL, LDLDIRECT in the last 72 hours. Thyroid Function Tests: No results for input(s): TSH, T4TOTAL, FREET4, T3FREE, THYROIDAB in the last 72 hours. Anemia Panel: Recent Labs  01/09/19 0750 01/10/19 0120  FERRITIN 83 99      Radiology Studies: I have reviewed all of the imaging during this hospital visit personally     Scheduled Meds: . apixaban  2.5 mg Oral BID  . dexamethasone (DECADRON) injection  6 mg Intravenous Q breakfast  . diltiazem  180 mg Oral Daily  . feeding supplement (ENSURE ENLIVE)  237 mL Oral BID BM  . guaiFENesin-dextromethorphan  5 mL Oral Q6H  . Ipratropium-Albuterol  1 puff Inhalation QID  . levothyroxine  137 mcg Oral Q0600  . mometasone-formoterol  2 puff Inhalation BID   Continuous Infusions:   LOS: 5 days        Rosanne Wohlfarth Gerome Apley, MD

## 2019-01-10 NOTE — Evaluation (Signed)
Physical Therapy Evaluation Patient Details Name: Heather [name] MRN: [id number] DOB: 08-18-1926 Today's Date: 01/10/2019   History of Present Illness  85 y.o. female with medical history significant of paroxysmal atrial fibrillation on Eliquis, COPD, hypertension who presented 01/05/19 with concerns of fever and increasing fatigue. Patient was initially treated by EDP with doxycycline and steroids for COPD exacerbation.  Initially had plans to discharge home but developed hypotension and COVID testing returned positive necessitating admission by [facility] hospitalist. CT chest found new rt lobe mass.   Clinical Impression   Pt admitted with above diagnosis. On room air, pt desaturated from 97% to 90% with talking. With walking, she dropped to 85% and unable to incr sats with deep breaths. Resumed 1L O2 and maintained sats 90% with walking back to her chair from bathroom. At rest, able to wean back down to room air with sats 95-100%. Educated on pursed lip breathing and importance of pursing her lips (she tends to just exhale after deep breath).  Pt currently with functional limitations due to the deficits listed below (see PT Problem List). Pt will benefit from skilled PT to increase their independence and safety with mobility to allow discharge to the venue listed below.       Follow Up Recommendations Home health PT;Supervision - Intermittent    Equipment Recommendations  None recommended by PT    Recommendations for Other Services       Precautions / Restrictions Precautions Precautions: Fall;Other (comment) Precaution Comments: monitor sats      Mobility  Bed Mobility                  Transfers Overall transfer level: Modified independent Equipment used: Rolling walker (2 wheeled)                Ambulation/Gait Ambulation/Gait assistance: Supervision Gait Distance (Feet): 22 Feet(x2 (to/from toilet)) Assistive device: Rolling walker (2 wheeled) Gait  Pattern/deviations: WFL(Within Functional Limits)     General Gait Details: tends to move quickly; cues to slow down and slow her breathing  Stairs            Wheelchair Mobility    Modified Rankin (Stroke Patients Only)       Balance Overall balance assessment: Mild deficits observed, not formally tested                                           Pertinent Vitals/Pain Pain Assessment: No/denies pain    Home Living Family/patient expects to be discharged to:: Private residence Living Arrangements: Children;Spouse/significant other(daughter) Available Help at Discharge: Family;Personal care attendant(daughter works from home; caregiver for her husband) Type of Home: House Home Access: Ramped entrance     Home Layout: One level Home Equipment: Grab bars - toilet;Grab bars - tub/shower;Walker - 2 wheels;Cane - single point;Walker - 4 wheels      Prior Function Level of Independence: Independent with assistive device(s)         Comments: has been using a cane but has transitioned to using rollator as she has gotten weaker     Hand Dominance   Dominant Hand: Right    Extremity/Trunk Assessment   Upper Extremity Assessment Upper Extremity Assessment: Generalized weakness    Lower Extremity Assessment Lower Extremity Assessment: Generalized weakness    Cervical / Trunk Assessment Cervical / Trunk Assessment: Normal  Communication   Communication: No difficulties  Cognition Arousal/Alertness: Awake/alert Behavior During Therapy: Anxious Overall Cognitive Status: Within Functional Limits for tasks assessed                                 General Comments: worried about how weak she has gotten      General Comments General comments (skin integrity, edema, etc.): on .5L on arrival with sats 96% (dropping to 90% with conversation); on 1L to bathroom with sats 93-98%; while seated, switched to room air sats 95%; standing at  sink on room air sats dropped to 85%, resumed 1L O2 for walk to chair with sats 95%; weaned back to room air while sitting with sats 95% at end of session    Exercises General Exercises - Lower Extremity Ankle Circles/Pumps: AROM;Both;10 reps;Seated Long Arc Quad: AROM;Strengthening;Both;10 reps;Seated(5 sec hold) Hip Flexion/Marching: AROM;Both;10 reps;Seated   Assessment/Plan    PT Assessment Patient needs continued PT services  PT Problem List Decreased strength;Decreased activity tolerance;Decreased balance;Decreased mobility;Decreased knowledge of use of DME;Cardiopulmonary status limiting activity       PT Treatment Interventions DME instruction;Gait training;Functional mobility training;Therapeutic activities;Therapeutic exercise;Balance training;Patient/family education    PT Goals (Current goals can be found in the Care Plan section)  Acute Rehab PT Goals Patient Stated Goal: get back to my usual self--before I got COVID PT Goal Formulation: With patient Time For Goal Achievement: 01/24/19 Potential to Achieve Goals: Good    Frequency Min 3X/week   Barriers to discharge        Co-evaluation               AM-PAC PT "6 Clicks" Mobility  Outcome Measure Help needed turning from your back to your side while in a flat bed without using bedrails?: None Help needed moving from lying on your back to sitting on the side of a flat bed without using bedrails?: None Help needed moving to and from a bed to a chair (including a wheelchair)?: None Help needed standing up from a chair using your arms (e.g., wheelchair or bedside chair)?: None Help needed to walk in hospital room?: A Little Help needed climbing 3-5 steps with a railing? : A Little 6 Click Score: 22    End of Session Equipment Utilized During Treatment: Oxygen Activity Tolerance: Patient limited by fatigue Patient left: in chair;with call bell/phone within reach Nurse Communication: Mobility status;Other  (comment)(O2 needs) PT Visit Diagnosis: Unsteadiness on feet (R26.81);Muscle weakness (generalized) (M62.81);Difficulty in walking, not elsewhere classified (R26.2)    Time: 1400-1443 PT Time Calculation (min) (ACUTE ONLY): 43 min   Charges:   PT Evaluation $PT Eval Low Complexity: 1 Low PT Treatments $Gait Training: 8-22 mins $Therapeutic Exercise: 8-22 mins          Barry Brunner, PT      Rexanne Mano 01/10/2019, 2:53 PM

## 2019-01-10 NOTE — Progress Notes (Signed)
I spoke over the phone with the patient's daughter about patient's  condition, plan of care and all questions were addressed.   Plan for dc home in am with home health and home 02.

## 2019-01-10 NOTE — Care Management (Signed)
Attempt to reach daughter to arrange [facility] for DC tomorrow, no answer.

## 2019-01-11 LAB — COMPREHENSIVE METABOLIC PANEL
ALT: 37 U/L (ref 0–44)
AST: 23 U/L (ref 15–41)
Albumin: 3.1 g/dL — ABNORMAL LOW (ref 3.5–5.0)
Alkaline Phosphatase: 65 U/L (ref 38–126)
Anion gap: 10 (ref 5–15)
BUN: 28 mg/dL — ABNORMAL HIGH (ref 8–23)
CO2: 28 mmol/L (ref 22–32)
Calcium: 8.6 mg/dL — ABNORMAL LOW (ref 8.9–10.3)
Chloride: 103 mmol/L (ref 98–111)
Creatinine, Ser: 0.51 mg/dL (ref 0.44–1.00)
GFR calc Af Amer: >60 mL/min (ref >60)
GFR calc non Af Amer: >60 mL/min (ref >60)
Glucose, Bld: 152 mg/dL — ABNORMAL HIGH (ref 70–99)
Potassium: 3.6 mmol/L (ref 3.5–5.1)
Sodium: 141 mmol/L (ref 135–145)
Total Bilirubin: 0.7 mg/dL (ref 0.3–1.2)
Total Protein: 5.3 g/dL — ABNORMAL LOW (ref 6.5–8.1)

## 2019-01-11 LAB — FERRITIN: Ferritin: 83 ng/mL (ref 11–307)

## 2019-01-11 LAB — C-REACTIVE PROTEIN: CRP: <0.8 mg/dL (ref <1.0)

## 2019-01-11 LAB — D-DIMER, QUANTITATIVE: D-Dimer, Quant: 0.5 ug/mL-FEU (ref 0.00–0.50)

## 2019-01-11 MED ORDER — GUAIFENESIN-DM 100-10 MG/5ML PO SYRP: 5.0000 mL | ORAL_SOLUTION | Freq: Four times a day (QID) | ORAL | 0 refills | Status: DC | PRN

## 2019-01-11 MED ORDER — ACETAMINOPHEN 325 MG PO TABS: 650.0000 mg | ORAL_TABLET | Freq: Four times a day (QID) | ORAL | 0 refills | Status: DC | PRN

## 2019-01-11 MED ORDER — ENSURE ENLIVE PO LIQD: 237.0000 mL | Freq: Two times a day (BID) | ORAL | 0 refills | Status: AC

## 2019-01-11 NOTE — Discharge Summary (Signed)
Physician Discharge Summary  Heather [name] DGU:[id number] DOB: [birth year] DOA: 01/05/2019  PCP: Burnard Bunting, MD  Admit date: 01/05/2019 Discharge date: 01/11/2019  Admitted From: Home  Disposition:  Home   Recommendations for Outpatient Follow-up and new medication changes:  1. Follow up with Dr. Winona Legato in 14 days.  2. Please self quarantine, maintain physical distancing and use a mask in public for the next 14 days.  3. Patient very deconditioned, furosemide has been discontinued for now, along with K supplements.  4. Please follow up on right middle lobe new pulmonary nodule.  5. Patient will be discharge on supplemental 02 per  2 LPM.   Home Health: yes   Equipment/Devices: Home 02    Discharge Condition: stable  CODE STATUS: full  Diet recommendation: heart healthy   Brief/Interim Summary: 85 year old female who presented with fever.She does have significant past medical history for paroxysmal atrial fibrillation, COPD and hypertension. Patient reported 3 days of fever, fatigue, sore throat, runny nose and body aches. Her dyspnea has been stable but she had noticed increased sputum production along with cough. She was seen by her primary care physician who found her hypoxic and sent her to the emergency room. On initial physical examination patient was afebrile, her blood pressure was 90/50, her oxygen saturation was 92 to 93%, at rest, on ambulation 88%, she had moist mucous membranes, her lungs were clear to auscultation bilaterally, evident dyspnea, heart S1-S2 present and rhythmic, abdomen soft, positive pedal edema bilaterally. Sodium 136, potassium 3.7, chloride 96 bicarb 20, glucose 105, BUN 28, creatinine 0.89, white count 5.0, hemoglobin 14.4,hematocrit 43.9, platelets 219. Chest radiograph had hyperinflation no infiltrates. EKG with82 bpm, normal axis, normal intervals, sinus rhythm, no ST segment T wave changes.  Patient was admitted to the hospital with a  working diagnosis of hypotension and hypoxemia in the setting of SARS COVID-19 infection.  [place] admission hadpersistent hypoxemia down to 70-88% on room air, she has baseline COPD. Pretest probability for viral pneumonia washigh, patient with significant risk factors for poor outcomes, including elderly, deconditioned and baseline COPD.Patient placed onRemdesivir and Dexamethasone.  Further work up with CT chest showed very faint isolated ground glass opacity at theanterior right lower lobe along with significant emphysema. Positive16x12 mm new nodular focus at medial right middle lobe base adjacent to the mediastinum, possible developing mass.  Through hospitalization patient continue to be deconditioned, but improved oxygen requirements.  1.  Acute hypoxic respiratory failure due to SARS COVID-19 viral pneumonia, present on admission, complicated by acute COPD exacerbation.  Patient was admitted to the medical ward, she received supplemental oxygen per nasal cannula, systemic steroids with dexamethasone 6 mg intravenously daily along intravenous Remdesivir.  She was placed on bronchodilator therapy with albuterol, mometasone and formoterol.  Patient responded well to medical therapy with improvement of her symptoms and inflammatory markers, She had decrease in her oxygen requirements.  By discharge at rest on room air her oxygen saturation was 90%, and on ambulation down to 85%.  Patient will have home oxygen arranged, 2 L/min.  Continue bronchodilator therapy at home.  Home health services will be arranged.  2.  Hypotension due to volume depletion.  Initially her antihypertensive agents were held along with her diuretic therapy.  Through the course of her hospitalization her blood pressure improved, at home she will resume her antihypertensive agents but furosemide will be held for now while patient recovering from her viral illness.  3.  Paroxysmal atrial fibrillation.  Patient  remains in  sinus rhythm, continue rate control with diltiazem, anticoagulation with apixaban.  4.  Protein calorie malnutrition/undetermined severity.  Patient with very poor oral intake, deconditioning, she will continue with nutritional supplements, physical therapy recommended home health services.  5.  Hypothyroidism.  Continue with levothyroxine.  6.  New right middle lobe nodule with possible developing mass, 16 x 12 mm.  Need outpatient follow-up.   Discharge Diagnoses:  Principal Problem:   Acute respiratory failure with hypoxemia (HCC) Active Problems:   Ventral hernia   PAF (paroxysmal atrial fibrillation) (HCC)   Pulmonary emphysema (HCC)   HTN (hypertension)   Hypotension   Pneumonia due to COVID-19 virus    Discharge Instructions   Allergies as of 01/11/2019      Reactions   Ace Inhibitors Cough   Sulfur Nausea And Vomiting      Medication List    STOP taking these medications   furosemide 20 MG tablet Commonly known as: LASIX   furosemide 40 MG tablet Commonly known as: LASIX   potassium chloride SA 20 MEQ tablet Commonly known as: KLOR-CON     TAKE these medications   acetaminophen 325 MG tablet Commonly known as: TYLENOL Take 2 tablets (650 mg total) by mouth every 6 (six) hours as needed for mild pain or headache (fever >/= 101).   albuterol (2.5 MG/3ML) 0.083% nebulizer solution Commonly known as: PROVENTIL Take 2.5 mg by nebulization every 6 (six) hours as needed for wheezing or shortness of breath.   albuterol 108 (90 Base) MCG/ACT inhaler Commonly known as: VENTOLIN HFA Inhale 2 puffs into the lungs every 6 (six) hours as needed for wheezing or shortness of breath.   apixaban 2.5 MG Tabs tablet Commonly known as: Eliquis Take 1 tablet (2.5 mg total) by mouth 2 (two) times daily.   BIOGAIA PROBIOTIC PO Take 1 capsule by mouth daily.   calcium citrate-vitamin D 315-200 MG-UNIT tablet Commonly known as: CITRACAL+D Take 1 tablet by  mouth daily.   diltiazem 180 MG 24 hr capsule Commonly known as: CARDIZEM CD Take 180 mg by mouth daily.   feeding supplement (ENSURE ENLIVE) Liqd Take 237 mLs by mouth 2 (two) times daily between meals.   FORTEO [place] Inject 1 pen into the skin daily.   guaiFENesin-dextromethorphan 100-10 MG/5ML syrup Commonly known as: ROBITUSSIN DM Take 5 mLs by mouth every 6 (six) hours as needed for cough.   hydrochlorothiazide 25 MG tablet Commonly known as: HYDRODIURIL Take 1 tablet (25 mg total) by mouth daily.   levothyroxine 137 MCG tablet Commonly known as: SYNTHROID Take 137 mcg by mouth daily before breakfast.   losartan 100 MG tablet Commonly known as: COZAAR TAKE 1 TABLET EVERY DAY What changed:   how much to take  how to take this  when to take this  additional instructions   multivitamin with minerals tablet Take 1 tablet by mouth daily.   polyethylene glycol 17 g packet Commonly known as: MIRALAX / GLYCOLAX Take 17 g by mouth every other day.   Symbicort 160-4.5 MCG/ACT inhaler Generic drug: budesonide-formoterol Inhale 2 puffs into the lungs 2 (two) times daily.   THERATEARS OP Place 2 drops into both eyes 2 (two) times daily.            Durable Medical Equipment  (From admission, onward)         Start     Ordered   01/10/19 1601  For home use only DME oxygen  Once    Question Answer Comment  Length of Need 6 Months   Mode or (Route) Nasal cannula   Liters per Minute 2   Frequency Continuous (stationary and portable oxygen unit needed)   Oxygen conserving device Yes   Oxygen delivery system Gas      01/10/19 1602         Follow-up Information    Burnard Bunting, MD. Call in 2 week(s).   Specialty: Internal Medicine Why: and let them know how you are doing. Contact information: [address] [place] [address] [phone]          Allergies  Allergen Reactions  . Ace Inhibitors Cough  . Sulfur Nausea And Vomiting     Consultations:     Procedures/Studies: Dg Chest 1 View  Result Date: 01/06/2019 CLINICAL DATA:  Dyspnea. EXAM: CHEST  1 VIEW COMPARISON:  Radiograph of January 05, 2019. FINDINGS: The heart size and mediastinal contours are within normal limits. Atherosclerosis of thoracic aorta is noted. No pneumothorax or pleural effusion is noted. Stable elevated right hemidiaphragm. Emphysematous disease is noted in the upper lobes. Both lungs are otherwise clear. The visualized skeletal structures are unremarkable. IMPRESSION: No acute cardiopulmonary abnormality seen. Aortic Atherosclerosis (ICD10-I70.0) and Emphysema (ICD10-J43.9). Electronically Signed   By: Marijo Conception M.D.   On: 01/06/2019 12:28   Ct Chest Wo Contrast  Result Date: 01/07/2019 CLINICAL DATA:  Acute hypoxic respiratory failure suspected due to COVID-19 EXAM: CT CHEST WITHOUT CONTRAST TECHNIQUE: Multidetector CT imaging of the chest was performed following the standard protocol without IV contrast. Sagittal and coronal MPR images reconstructed from axial data set. COMPARISON:  06/30/2007 FINDINGS: Cardiovascular: Atherosclerotic calcifications of aorta, coronary arteries and proximal great vessels. Aneurysmal dilatation of ascending thoracic aorta 4.0 cm transverse. No pericardial effusion. Mediastinum/Nodes: Base of cervical region normal appearance. No thoracic adenopathy. Esophagus unremarkable. Lungs/Pleura: Severe emphysematous changes. Mild central peribronchial thickening. Eventration of the posterior RIGHT diaphragm with associated atelectasis in RIGHT lower lobe. 3 mm RIGHT lower lobe nodule image 71 unchanged since 2009, likely calcified granuloma based off of attenuation on prior exam. Nodular density at anteromedial base of RIGHT middle lobe 16 x 12 mm new since prior exam, could represent a developing mass though cannot exclude atelectasis. Subsegmental atelectasis in lingula. No segmental consolidation, pleural effusion or  pneumothorax. Upper Abdomen: Scattered atherosclerotic calcifications. No definite focal upper abdominal abnormalities. Musculoskeletal: Osseous demineralization with compression fractures of multiple thoracic vertebra, new since prior exam. IMPRESSION: Severe emphysematous changes with bibasilar atelectasis and probable calcified granuloma RIGHT lower lobe. No acute pulmonary infiltrates identified. 16 x 12 mm new nodular focus at medial RIGHT middle lobe base adjacent mediastinum, question developing mass though atelectasis not completely excluded; further assessment by either PET-CT imaging or short-term follow-up CT imaging to ensure resolution recommended to exclude malignancy. Extensive atherosclerotic calcifications including coronary arteries. Multiple thoracic spine compression fractures and osteoporosis. Aneurysmal dilatation ascending thoracic aorta 4.0 cm transverse, recommendation below. Recommend annual imaging followup by CTA or MRA. This recommendation follows 2010 ACCF/AHA/AATS/ACR/ASA/SCA/SCAI/SIR/STS/SVM Guidelines for the Diagnosis and Management of Patients with Thoracic Aortic Disease. Circulation. 2010; 121: D220-U542. Aortic aneurysm NOS (ICD10-I71.9) Aortic Atherosclerosis (ICD10-I70.0) and Emphysema (ICD10-J43.9). Aortic aneurysm NOS (ICD10-I71.9). These results will be called to the ordering clinician or representative by the Radiologist Assistant, and communication documented in the PACS or zVision Dashboard. Electronically Signed   By: Lavonia Dana M.D.   On: 01/07/2019 09:26   Dg Chest Port 1 View  Result Date: 01/05/2019 CLINICAL DATA:  Shortness of breath, weakness  and low O2 saturations. EXAM: PORTABLE CHEST 1 VIEW COMPARISON:  04/24/2018 FINDINGS: Right hemidiaphragm remains elevated. Left lung is hyperinflated with signs of emphysema. Cardiomediastinal contours are stable and there is atherosclerotic change in the thoracic aorta and left carotid artery. No acute bone finding.  IMPRESSION: No acute cardiopulmonary disease. Dense calcification over left carotid artery and signs of aortic atherosclerosis. Aortic Atherosclerosis (ICD10-I70.0) and Emphysema (ICD10-J43.9). Electronically Signed   By: Zetta Bills M.D.   On: 01/05/2019 16:05      Procedures:   Subjective: Patient is feeling better, dyspnea continue to improve, no nausea or vomiting, no chest pain.   Discharge Exam: Vitals:   01/11/19 0440 01/11/19 0738  BP: 138/61 (!) 148/73  Pulse: 65 79  Resp: 18   Temp: 97.9 F (36.6 C) 97.8 F (36.6 C)  SpO2: 97% 98%   Vitals:   01/10/19 1700 01/10/19 1955 01/11/19 0440 01/11/19 0738  BP: (!) 143/77 129/70 138/61 (!) 148/73  Pulse: 66 79 65 79  Resp: 18  18   Temp: 98.3 F (36.8 C) 98.1 F (36.7 C) 97.9 F (36.6 C) 97.8 F (36.6 C)  TempSrc: Oral Oral Oral Oral  SpO2: 95% 94% 97% 98%  Weight:      Height:        General: Not in pain, dyspnea on exertion  Neurology: Awake and alert, non focal  E ENT: no pallor, no icterus, oral mucosa moist Cardiovascular: No JVD. S1-S2 present, rhythmic, no gallops, rubs, or murmurs. No lower extremity edema. Pulmonary: positive breath sounds bilaterally, decreases bilateral air movement, no wheezing, rhonchi or rales. Gastrointestinal. Abdomen with no organomegaly, non tender, no rebound or guarding Skin. No rashes Musculoskeletal: no joint deformities   The results of significant diagnostics from this hospitalization (including imaging, microbiology, ancillary and laboratory) are listed below for reference.     Microbiology: Recent Results (from the past 240 hour(s))  SARS Coronavirus 2 [facility] order, Performed in [facility] hospital lab) Nasopharyngeal Nasopharyngeal Swab     Status: Abnormal   Collection Time: 01/05/19  5:16 PM   Specimen: Nasopharyngeal Swab  Result Value Ref Range Status   SARS Coronavirus 2 POSITIVE (A) NEGATIVE Final    Comment: CRITICAL RESULT CALLED TO, READ BACK BY AND  VERIFIED WITH: RN k zuleta at [place] 01/05/19 cruickshank a (NOTE) If result is NEGATIVE SARS-CoV-2 target nucleic acids are NOT DETECTED. The SARS-CoV-2 RNA is generally detectable in upper and lower  respiratory specimens during the acute phase of infection. The lowest  concentration of SARS-CoV-2 viral copies this assay can detect is 250  copies / mL. A negative result does not preclude SARS-CoV-2 infection  and should not be used as the sole basis for treatment or other  patient management decisions.  A negative result may occur with  improper specimen collection / handling, submission of specimen other  than nasopharyngeal swab, presence of viral mutation(s) within the  areas targeted by this assay, and inadequate number of viral copies  (<250 copies / mL). A negative result must be combined with clinical  observations, patient history, and epidemiological information. If result is POSITIVE SARS-CoV-2 target nucleic acids a re DETECTED. The SARS-CoV-2 RNA is generally detectable in upper and lower  respiratory specimens during the acute phase of infection.  Positive  results are indicative of active infection with SARS-CoV-2.  Clinical  correlation with patient history and other diagnostic information is  necessary to determine patient infection status.  Positive results do  not rule  out bacterial infection or co-infection with other viruses. If result is PRESUMPTIVE POSTIVE SARS-CoV-2 nucleic acids MAY BE PRESENT.   A presumptive positive result was obtained on the submitted specimen  and confirmed on repeat testing.  While 2019 novel coronavirus  (SARS-CoV-2) nucleic acids may be present in the submitted sample  additional confirmatory testing may be necessary for epidemiological  and / or clinical management purposes  to differentiate between  SARS-CoV-2 and other Sarbecovirus currently known to infect humans.  If clinically indicated additional testing with an alternate test   methodolo gy [phone]) is advised. The SARS-CoV-2 RNA is generally  detectable in upper and lower respiratory specimens during the acute  phase of infection. The expected result is Negative. Fact Sheet for Patients:  StrictlyIdeas.no Fact Sheet for Healthcare Providers: BankingDealers.co.za This test is not yet approved or cleared by the [place] FDA and has been authorized for detection and/or diagnosis of SARS-CoV-2 by FDA under an Emergency Use Authorization (EUA).  This EUA will remain in effect (meaning this test can be used) for the duration of the COVID-19 declaration under Section 564(b)(1) of the Act, 21 U.S.C. section 360bbb-3(b)(1), unless the authorization is terminated or revoked sooner. Performed at [facility], [place] [address] [place], [place] [address]      Labs: BNP (last 3 results) Recent Labs    03/24/18 1820  BNP 78.9   Basic Metabolic Panel: Recent Labs  Lab 01/05/19 1550 01/07/19 0525 01/08/19 0015 01/08/19 1415 01/09/19 0750 01/10/19 0120  NA 136 138  139 143  --  142 140  K 3.7 4.3  4.3 5.4* 4.2 3.8 3.9  CL 96* 103  102 107  --  105 104  CO2 28 27  29 30   --  29 27  GLUCOSE 105* 168*  171* 162*  --  111* 150*  BUN 28* 26*  26* 29*  --  27* 27*  CREATININE 0.89 0.73  0.56 0.65  --  0.54 0.51  CALCIUM 9.3 8.5*  8.6* 8.9  --  8.5* 8.6*   Liver Function Tests: Recent Labs  Lab 01/05/19 1550 01/07/19 0525 01/08/19 0015 01/09/19 0750 01/10/19 0120  AST 30 26 25 26 31   ALT 27 25 26 30  40  ALKPHOS 82 66 66 59 64  BILITOT 0.7 0.5 0.3 0.9 0.5  PROT 6.5 5.6* 5.5* 5.2* 5.1*  ALBUMIN 3.9 3.2* 3.2* 3.1* 3.0*   No results for input(s): LIPASE, AMYLASE in the last 168 hours. No results for input(s): AMMONIA in the last 168 hours. CBC: Recent Labs  Lab 01/05/19 1550 01/07/19 0525  WBC 5.0 3.7*  NEUTROABS 3.2 2.6  HGB 14.4 13.9  HCT 43.9 41.5  MCV 94.4 93.5   PLT 219 219   Cardiac Enzymes: No results for input(s): CKTOTAL, CKMB, CKMBINDEX, TROPONINI in the last 168 hours. BNP: Invalid input(s): POCBNP CBG: No results for input(s): GLUCAP in the last 168 hours. D-Dimer Recent Labs    01/09/19 0750 01/10/19 0120  DDIMER 0.51* 0.48   Hgb A1c No results for input(s): HGBA1C in the last 72 hours. Lipid Profile No results for input(s): CHOL, HDL, LDLCALC, TRIG, CHOLHDL, LDLDIRECT in the last 72 hours. Thyroid function studies No results for input(s): TSH, T4TOTAL, T3FREE, THYROIDAB in the last 72 hours.  Invalid input(s): FREET3 Anemia work up Recent Labs    01/09/19 0750 01/10/19 0120  FERRITIN 83 99   Urinalysis    Component Value Date/Time   COLORURINE YELLOW 04/25/2018 0329  APPEARANCEUR HAZY (A) 04/25/2018 0329   LABSPEC 1.014 04/25/2018 0329   PHURINE 5.0 04/25/2018 0329   GLUCOSEU NEGATIVE 04/25/2018 0329   HGBUR NEGATIVE 04/25/2018 0329   BILIRUBINUR NEGATIVE 04/25/2018 0329   KETONESUR NEGATIVE 04/25/2018 0329   PROTEINUR NEGATIVE 04/25/2018 0329   UROBILINOGEN 0.2 05/17/2013 1207   NITRITE NEGATIVE 04/25/2018 0329   LEUKOCYTESUR NEGATIVE 04/25/2018 0329   Sepsis Labs Invalid input(s): PROCALCITONIN,  WBC,  LACTICIDVEN Microbiology Recent Results (from the past 240 hour(s))  SARS Coronavirus 2 [facility] order, Performed in [facility] hospital lab) Nasopharyngeal Nasopharyngeal Swab     Status: Abnormal   Collection Time: 01/05/19  5:16 PM   Specimen: Nasopharyngeal Swab  Result Value Ref Range Status   SARS Coronavirus 2 POSITIVE (A) NEGATIVE Final    Comment: CRITICAL RESULT CALLED TO, READ BACK BY AND VERIFIED WITH: RN k zuleta at [place] 01/05/19 cruickshank a (NOTE) If result is NEGATIVE SARS-CoV-2 target nucleic acids are NOT DETECTED. The SARS-CoV-2 RNA is generally detectable in upper and lower  respiratory specimens during the acute phase of infection. The lowest  concentration of SARS-CoV-2 viral  copies this assay can detect is 250  copies / mL. A negative result does not preclude SARS-CoV-2 infection  and should not be used as the sole basis for treatment or other  patient management decisions.  A negative result may occur with  improper specimen collection / handling, submission of specimen other  than nasopharyngeal swab, presence of viral mutation(s) within the  areas targeted by this assay, and inadequate number of viral copies  (<250 copies / mL). A negative result must be combined with clinical  observations, patient history, and epidemiological information. If result is POSITIVE SARS-CoV-2 target nucleic acids a re DETECTED. The SARS-CoV-2 RNA is generally detectable in upper and lower  respiratory specimens during the acute phase of infection.  Positive  results are indicative of active infection with SARS-CoV-2.  Clinical  correlation with patient history and other diagnostic information is  necessary to determine patient infection status.  Positive results do  not rule out bacterial infection or co-infection with other viruses. If result is PRESUMPTIVE POSTIVE SARS-CoV-2 nucleic acids MAY BE PRESENT.   A presumptive positive result was obtained on the submitted specimen  and confirmed on repeat testing.  While 2019 novel coronavirus  (SARS-CoV-2) nucleic acids may be present in the submitted sample  additional confirmatory testing may be necessary for epidemiological  and / or clinical management purposes  to differentiate between  SARS-CoV-2 and other Sarbecovirus currently known to infect humans.  If clinically indicated additional testing with an alternate test  methodolo gy [phone]) is advised. The SARS-CoV-2 RNA is generally  detectable in upper and lower respiratory specimens during the acute  phase of infection. The expected result is Negative. Fact Sheet for Patients:  StrictlyIdeas.no Fact Sheet for Healthcare  Providers: BankingDealers.co.za This test is not yet approved or cleared by the [place] FDA and has been authorized for detection and/or diagnosis of SARS-CoV-2 by FDA under an Emergency Use Authorization (EUA).  This EUA will remain in effect (meaning this test can be used) for the duration of the COVID-19 declaration under Section 564(b)(1) of the Act, 21 U.S.C. section 360bbb-3(b)(1), unless the authorization is terminated or revoked sooner. Performed at [facility], [place] [address], [place], [place] [address]      Time coordinating discharge: 45 minutes  SIGNED:   Tawni Millers, MD  [facility] 01/11/2019, 8:12 AM

## 2019-01-11 NOTE — TOC Transition Note (Signed)
Transition of Care [facility]) - CM/SW Discharge Note   Patient Details  Name: Heather [name] MRN: [id number] Date of Birth: 1926/09/08  Transition of Care [facility]) CM/SW Contact:  Ninfa Meeker, RN Phone Number: 01/11/2019, 10:34 AM   Clinical Narrative:   85 year old female who presented with fever.She does have significant past medical history for paroxysmal atrial fibrillation, COPD and hypertension.Patient was admitted with hypotension and hypoxemia due to COVID-19 infection. Case manager contacted patient's daughter, Melene Plan- [phone], to discuss [place] needs and offer choice. Referral called to Adela Lank, [facility] Liaison for [facility] and contacted Learta Codding to arrange for Oxygen to be delivered to patient's home. Patient lives with her daughter and son-in-law. Case manager contacted Dannielle Karvonen to request that a tank be delivered to patient's room for transport home. Patient's daughter will pick her up when she is ready for discharge.                             Patient reported 3 days of fever, fatigue, sore throat, runny nose and body aches. Her dyspnea has been stable but she had noticed increased sputum production along with cough. She was seen by her primary care physician who found her hypoxic and sent her to the emergency room. On initial physical examination patient was afebrile, her blood pressure was 90/50, her oxygen saturation was 92 to 93%, at rest, on ambulation 88%, she had moist mucous membranes, her lungs were clear to auscultation bilaterally, evident dyspnea, heart S1-S2 present and rhythmic, abdomen soft, positive pedal edema bilaterally. Sodium 136, potassium 3.7, chloride 96 bicarb 20, glucose 105, BUN 28, creatinine 0.89, white count 5.0, hemoglobin 14.4,hematocrit 43.9, platelets 219. Chest radiograph had hyperinflation no infiltrates. EKG with82 bpm, normal axis, normal intervals, sinus rhythm, no ST segment T wave  changes.  Patient was admitted to the hospital with a working diagnosis of hypotension and hypoxemia in the setting of SARS COVID-19 infection.    Final next level of care: [place] Barriers to Discharge: No Barriers Identified   Patient Goals and CMS Choice Patient states their goals for this hospitalization and ongoing recovery are:: to get better per family CMS Medicare.gov Compare Post Acute Care list provided to:: Patient Represenative (must comment)(daughter - Melene Plan) Choice offered to / list presented to : Adult Children(Daughter)  Discharge Placement                       Discharge Plan and Services                DME Arranged: Oxygen DME Agency: [place] Date DME Agency Contacted: 01/11/19 Time DME Agency Contacted: [phone]   [place] Arranged: PT, OT HH Agency: [place] Date [place]: 01/11/19 Time [place]: 1030 Representative spoke with at [place]: Adela Lank  Social Determinants of Health (SDOH) Interventions     Readmission Risk Interventions No flowsheet data found.

## 2019-01-11 NOTE — Progress Notes (Signed)
Patient deferred family update phone call. 

## 2019-01-12 DIAGNOSIS — U071 COVID-19: Secondary | ICD-10-CM | POA: Diagnosis not present

## 2019-01-18 DIAGNOSIS — U071 COVID-19: Secondary | ICD-10-CM | POA: Diagnosis not present

## 2019-01-18 DIAGNOSIS — I5032 Chronic diastolic (congestive) heart failure: Secondary | ICD-10-CM | POA: Diagnosis not present

## 2019-01-18 DIAGNOSIS — R911 Solitary pulmonary nodule: Secondary | ICD-10-CM | POA: Diagnosis not present

## 2019-01-18 DIAGNOSIS — I110 Hypertensive heart disease with heart failure: Secondary | ICD-10-CM | POA: Diagnosis not present

## 2019-01-18 DIAGNOSIS — I480 Paroxysmal atrial fibrillation: Secondary | ICD-10-CM | POA: Diagnosis not present

## 2019-01-18 DIAGNOSIS — E039 Hypothyroidism, unspecified: Secondary | ICD-10-CM | POA: Diagnosis not present

## 2019-01-18 DIAGNOSIS — E46 Unspecified protein-calorie malnutrition: Secondary | ICD-10-CM | POA: Diagnosis not present

## 2019-01-18 DIAGNOSIS — Z9981 Dependence on supplemental oxygen: Secondary | ICD-10-CM | POA: Insufficient documentation

## 2019-01-18 DIAGNOSIS — I959 Hypotension, unspecified: Secondary | ICD-10-CM | POA: Diagnosis not present

## 2019-01-18 DIAGNOSIS — J9601 Acute respiratory failure with hypoxia: Secondary | ICD-10-CM | POA: Diagnosis not present

## 2019-01-20 ENCOUNTER — Encounter (INDEPENDENT_AMBULATORY_CARE_PROVIDER_SITE_OTHER): Payer: Self-pay

## 2019-01-20 DIAGNOSIS — I5032 Chronic diastolic (congestive) heart failure: Secondary | ICD-10-CM | POA: Diagnosis not present

## 2019-01-20 DIAGNOSIS — N189 Chronic kidney disease, unspecified: Secondary | ICD-10-CM | POA: Diagnosis not present

## 2019-01-20 DIAGNOSIS — I480 Paroxysmal atrial fibrillation: Secondary | ICD-10-CM | POA: Diagnosis not present

## 2019-01-20 DIAGNOSIS — J441 Chronic obstructive pulmonary disease with (acute) exacerbation: Secondary | ICD-10-CM | POA: Diagnosis not present

## 2019-01-20 DIAGNOSIS — J1281 Pneumonia due to SARS-associated coronavirus: Secondary | ICD-10-CM | POA: Diagnosis not present

## 2019-01-20 DIAGNOSIS — J9601 Acute respiratory failure with hypoxia: Secondary | ICD-10-CM | POA: Diagnosis not present

## 2019-01-20 DIAGNOSIS — M19042 Primary osteoarthritis, left hand: Secondary | ICD-10-CM | POA: Diagnosis not present

## 2019-01-20 DIAGNOSIS — I130 Hypertensive heart and chronic kidney disease with heart failure and stage 1 through stage 4 chronic kidney disease, or unspecified chronic kidney disease: Secondary | ICD-10-CM | POA: Diagnosis not present

## 2019-01-20 DIAGNOSIS — M19041 Primary osteoarthritis, right hand: Secondary | ICD-10-CM | POA: Diagnosis not present

## 2019-01-22 DIAGNOSIS — J9601 Acute respiratory failure with hypoxia: Secondary | ICD-10-CM | POA: Diagnosis not present

## 2019-01-22 DIAGNOSIS — J1281 Pneumonia due to SARS-associated coronavirus: Secondary | ICD-10-CM | POA: Diagnosis not present

## 2019-01-22 DIAGNOSIS — I130 Hypertensive heart and chronic kidney disease with heart failure and stage 1 through stage 4 chronic kidney disease, or unspecified chronic kidney disease: Secondary | ICD-10-CM | POA: Diagnosis not present

## 2019-01-22 DIAGNOSIS — J441 Chronic obstructive pulmonary disease with (acute) exacerbation: Secondary | ICD-10-CM | POA: Diagnosis not present

## 2019-01-22 DIAGNOSIS — I480 Paroxysmal atrial fibrillation: Secondary | ICD-10-CM | POA: Diagnosis not present

## 2019-01-22 DIAGNOSIS — M19042 Primary osteoarthritis, left hand: Secondary | ICD-10-CM | POA: Diagnosis not present

## 2019-01-22 DIAGNOSIS — I5032 Chronic diastolic (congestive) heart failure: Secondary | ICD-10-CM | POA: Diagnosis not present

## 2019-01-22 DIAGNOSIS — M19041 Primary osteoarthritis, right hand: Secondary | ICD-10-CM | POA: Diagnosis not present

## 2019-01-22 DIAGNOSIS — N189 Chronic kidney disease, unspecified: Secondary | ICD-10-CM | POA: Diagnosis not present

## 2019-01-24 DIAGNOSIS — R600 Localized edema: Secondary | ICD-10-CM | POA: Diagnosis not present

## 2019-01-24 DIAGNOSIS — I110 Hypertensive heart disease with heart failure: Secondary | ICD-10-CM | POA: Diagnosis not present

## 2019-01-24 DIAGNOSIS — I480 Paroxysmal atrial fibrillation: Secondary | ICD-10-CM | POA: Diagnosis not present

## 2019-01-24 DIAGNOSIS — Z9981 Dependence on supplemental oxygen: Secondary | ICD-10-CM | POA: Diagnosis not present

## 2019-01-24 DIAGNOSIS — J449 Chronic obstructive pulmonary disease, unspecified: Secondary | ICD-10-CM | POA: Diagnosis not present

## 2019-01-24 DIAGNOSIS — F4320 Adjustment disorder, unspecified: Secondary | ICD-10-CM | POA: Insufficient documentation

## 2019-01-24 DIAGNOSIS — R0600 Dyspnea, unspecified: Secondary | ICD-10-CM | POA: Diagnosis not present

## 2019-01-24 DIAGNOSIS — I5032 Chronic diastolic (congestive) heart failure: Secondary | ICD-10-CM | POA: Diagnosis not present

## 2019-01-29 DIAGNOSIS — J441 Chronic obstructive pulmonary disease with (acute) exacerbation: Secondary | ICD-10-CM | POA: Diagnosis not present

## 2019-01-29 DIAGNOSIS — J449 Chronic obstructive pulmonary disease, unspecified: Secondary | ICD-10-CM | POA: Diagnosis not present

## 2019-02-01 DIAGNOSIS — J441 Chronic obstructive pulmonary disease with (acute) exacerbation: Secondary | ICD-10-CM | POA: Diagnosis not present

## 2019-02-01 DIAGNOSIS — I5032 Chronic diastolic (congestive) heart failure: Secondary | ICD-10-CM | POA: Diagnosis not present

## 2019-02-01 DIAGNOSIS — M19042 Primary osteoarthritis, left hand: Secondary | ICD-10-CM | POA: Diagnosis not present

## 2019-02-01 DIAGNOSIS — I10 Essential (primary) hypertension: Secondary | ICD-10-CM | POA: Diagnosis not present

## 2019-02-01 DIAGNOSIS — I130 Hypertensive heart and chronic kidney disease with heart failure and stage 1 through stage 4 chronic kidney disease, or unspecified chronic kidney disease: Secondary | ICD-10-CM | POA: Diagnosis not present

## 2019-02-01 DIAGNOSIS — I480 Paroxysmal atrial fibrillation: Secondary | ICD-10-CM | POA: Diagnosis not present

## 2019-02-01 DIAGNOSIS — M19041 Primary osteoarthritis, right hand: Secondary | ICD-10-CM | POA: Diagnosis not present

## 2019-02-01 DIAGNOSIS — J9601 Acute respiratory failure with hypoxia: Secondary | ICD-10-CM | POA: Diagnosis not present

## 2019-02-01 DIAGNOSIS — J1281 Pneumonia due to SARS-associated coronavirus: Secondary | ICD-10-CM | POA: Diagnosis not present

## 2019-02-01 DIAGNOSIS — N189 Chronic kidney disease, unspecified: Secondary | ICD-10-CM | POA: Diagnosis not present

## 2019-02-02 ENCOUNTER — Telehealth: Payer: Self-pay | Admitting: Internal Medicine

## 2019-02-02 NOTE — Telephone Encounter (Signed)
Mailed application for eliquis patient assistance.  Patient to complete & return

## 2019-02-03 DIAGNOSIS — I130 Hypertensive heart and chronic kidney disease with heart failure and stage 1 through stage 4 chronic kidney disease, or unspecified chronic kidney disease: Secondary | ICD-10-CM | POA: Diagnosis not present

## 2019-02-03 DIAGNOSIS — N189 Chronic kidney disease, unspecified: Secondary | ICD-10-CM | POA: Diagnosis not present

## 2019-02-03 DIAGNOSIS — J441 Chronic obstructive pulmonary disease with (acute) exacerbation: Secondary | ICD-10-CM | POA: Diagnosis not present

## 2019-02-03 DIAGNOSIS — J1281 Pneumonia due to SARS-associated coronavirus: Secondary | ICD-10-CM | POA: Diagnosis not present

## 2019-02-03 DIAGNOSIS — I5032 Chronic diastolic (congestive) heart failure: Secondary | ICD-10-CM | POA: Diagnosis not present

## 2019-02-03 DIAGNOSIS — I480 Paroxysmal atrial fibrillation: Secondary | ICD-10-CM | POA: Diagnosis not present

## 2019-02-03 DIAGNOSIS — J9601 Acute respiratory failure with hypoxia: Secondary | ICD-10-CM | POA: Diagnosis not present

## 2019-02-03 DIAGNOSIS — M19042 Primary osteoarthritis, left hand: Secondary | ICD-10-CM | POA: Diagnosis not present

## 2019-02-03 DIAGNOSIS — M19041 Primary osteoarthritis, right hand: Secondary | ICD-10-CM | POA: Diagnosis not present

## 2019-02-05 DIAGNOSIS — M19042 Primary osteoarthritis, left hand: Secondary | ICD-10-CM | POA: Diagnosis not present

## 2019-02-05 DIAGNOSIS — I480 Paroxysmal atrial fibrillation: Secondary | ICD-10-CM | POA: Diagnosis not present

## 2019-02-05 DIAGNOSIS — I130 Hypertensive heart and chronic kidney disease with heart failure and stage 1 through stage 4 chronic kidney disease, or unspecified chronic kidney disease: Secondary | ICD-10-CM | POA: Diagnosis not present

## 2019-02-05 DIAGNOSIS — J9601 Acute respiratory failure with hypoxia: Secondary | ICD-10-CM | POA: Diagnosis not present

## 2019-02-05 DIAGNOSIS — J1281 Pneumonia due to SARS-associated coronavirus: Secondary | ICD-10-CM | POA: Diagnosis not present

## 2019-02-05 DIAGNOSIS — M19041 Primary osteoarthritis, right hand: Secondary | ICD-10-CM | POA: Diagnosis not present

## 2019-02-05 DIAGNOSIS — N189 Chronic kidney disease, unspecified: Secondary | ICD-10-CM | POA: Diagnosis not present

## 2019-02-05 DIAGNOSIS — I5032 Chronic diastolic (congestive) heart failure: Secondary | ICD-10-CM | POA: Diagnosis not present

## 2019-02-05 DIAGNOSIS — J441 Chronic obstructive pulmonary disease with (acute) exacerbation: Secondary | ICD-10-CM | POA: Diagnosis not present

## 2019-02-10 DIAGNOSIS — I480 Paroxysmal atrial fibrillation: Secondary | ICD-10-CM | POA: Diagnosis not present

## 2019-02-10 DIAGNOSIS — M19041 Primary osteoarthritis, right hand: Secondary | ICD-10-CM | POA: Diagnosis not present

## 2019-02-10 DIAGNOSIS — J441 Chronic obstructive pulmonary disease with (acute) exacerbation: Secondary | ICD-10-CM | POA: Diagnosis not present

## 2019-02-10 DIAGNOSIS — N189 Chronic kidney disease, unspecified: Secondary | ICD-10-CM | POA: Diagnosis not present

## 2019-02-10 DIAGNOSIS — J1281 Pneumonia due to SARS-associated coronavirus: Secondary | ICD-10-CM | POA: Diagnosis not present

## 2019-02-10 DIAGNOSIS — J9601 Acute respiratory failure with hypoxia: Secondary | ICD-10-CM | POA: Diagnosis not present

## 2019-02-10 DIAGNOSIS — M19042 Primary osteoarthritis, left hand: Secondary | ICD-10-CM | POA: Diagnosis not present

## 2019-02-10 DIAGNOSIS — I5032 Chronic diastolic (congestive) heart failure: Secondary | ICD-10-CM | POA: Diagnosis not present

## 2019-02-10 DIAGNOSIS — I130 Hypertensive heart and chronic kidney disease with heart failure and stage 1 through stage 4 chronic kidney disease, or unspecified chronic kidney disease: Secondary | ICD-10-CM | POA: Diagnosis not present

## 2019-02-12 DIAGNOSIS — U071 COVID-19: Secondary | ICD-10-CM | POA: Diagnosis not present

## 2019-02-15 DIAGNOSIS — I5032 Chronic diastolic (congestive) heart failure: Secondary | ICD-10-CM | POA: Diagnosis not present

## 2019-02-15 DIAGNOSIS — J1281 Pneumonia due to SARS-associated coronavirus: Secondary | ICD-10-CM | POA: Diagnosis not present

## 2019-02-15 DIAGNOSIS — N189 Chronic kidney disease, unspecified: Secondary | ICD-10-CM | POA: Diagnosis not present

## 2019-02-15 DIAGNOSIS — I480 Paroxysmal atrial fibrillation: Secondary | ICD-10-CM | POA: Diagnosis not present

## 2019-02-15 DIAGNOSIS — M19042 Primary osteoarthritis, left hand: Secondary | ICD-10-CM | POA: Diagnosis not present

## 2019-02-15 DIAGNOSIS — I130 Hypertensive heart and chronic kidney disease with heart failure and stage 1 through stage 4 chronic kidney disease, or unspecified chronic kidney disease: Secondary | ICD-10-CM | POA: Diagnosis not present

## 2019-02-15 DIAGNOSIS — J441 Chronic obstructive pulmonary disease with (acute) exacerbation: Secondary | ICD-10-CM | POA: Diagnosis not present

## 2019-02-15 DIAGNOSIS — M19041 Primary osteoarthritis, right hand: Secondary | ICD-10-CM | POA: Diagnosis not present

## 2019-02-15 DIAGNOSIS — J9601 Acute respiratory failure with hypoxia: Secondary | ICD-10-CM | POA: Diagnosis not present

## 2019-02-17 DIAGNOSIS — M19041 Primary osteoarthritis, right hand: Secondary | ICD-10-CM | POA: Diagnosis not present

## 2019-02-17 DIAGNOSIS — M19042 Primary osteoarthritis, left hand: Secondary | ICD-10-CM | POA: Diagnosis not present

## 2019-02-17 DIAGNOSIS — N189 Chronic kidney disease, unspecified: Secondary | ICD-10-CM | POA: Diagnosis not present

## 2019-02-17 DIAGNOSIS — I130 Hypertensive heart and chronic kidney disease with heart failure and stage 1 through stage 4 chronic kidney disease, or unspecified chronic kidney disease: Secondary | ICD-10-CM | POA: Diagnosis not present

## 2019-02-17 DIAGNOSIS — J1281 Pneumonia due to SARS-associated coronavirus: Secondary | ICD-10-CM | POA: Diagnosis not present

## 2019-02-17 DIAGNOSIS — I480 Paroxysmal atrial fibrillation: Secondary | ICD-10-CM | POA: Diagnosis not present

## 2019-02-17 DIAGNOSIS — J441 Chronic obstructive pulmonary disease with (acute) exacerbation: Secondary | ICD-10-CM | POA: Diagnosis not present

## 2019-02-17 DIAGNOSIS — J9601 Acute respiratory failure with hypoxia: Secondary | ICD-10-CM | POA: Diagnosis not present

## 2019-02-17 DIAGNOSIS — I5032 Chronic diastolic (congestive) heart failure: Secondary | ICD-10-CM | POA: Diagnosis not present

## 2019-02-22 DIAGNOSIS — I480 Paroxysmal atrial fibrillation: Secondary | ICD-10-CM | POA: Diagnosis not present

## 2019-02-22 DIAGNOSIS — J9601 Acute respiratory failure with hypoxia: Secondary | ICD-10-CM | POA: Diagnosis not present

## 2019-02-22 DIAGNOSIS — M19042 Primary osteoarthritis, left hand: Secondary | ICD-10-CM | POA: Diagnosis not present

## 2019-02-22 DIAGNOSIS — I130 Hypertensive heart and chronic kidney disease with heart failure and stage 1 through stage 4 chronic kidney disease, or unspecified chronic kidney disease: Secondary | ICD-10-CM | POA: Diagnosis not present

## 2019-02-22 DIAGNOSIS — J1281 Pneumonia due to SARS-associated coronavirus: Secondary | ICD-10-CM | POA: Diagnosis not present

## 2019-02-22 DIAGNOSIS — N189 Chronic kidney disease, unspecified: Secondary | ICD-10-CM | POA: Diagnosis not present

## 2019-02-22 DIAGNOSIS — M19041 Primary osteoarthritis, right hand: Secondary | ICD-10-CM | POA: Diagnosis not present

## 2019-02-22 DIAGNOSIS — J441 Chronic obstructive pulmonary disease with (acute) exacerbation: Secondary | ICD-10-CM | POA: Diagnosis not present

## 2019-02-22 DIAGNOSIS — I5032 Chronic diastolic (congestive) heart failure: Secondary | ICD-10-CM | POA: Diagnosis not present

## 2019-02-28 DIAGNOSIS — J441 Chronic obstructive pulmonary disease with (acute) exacerbation: Secondary | ICD-10-CM | POA: Diagnosis not present

## 2019-02-28 DIAGNOSIS — J449 Chronic obstructive pulmonary disease, unspecified: Secondary | ICD-10-CM | POA: Diagnosis not present

## 2019-03-01 DIAGNOSIS — I480 Paroxysmal atrial fibrillation: Secondary | ICD-10-CM | POA: Diagnosis not present

## 2019-03-01 DIAGNOSIS — J449 Chronic obstructive pulmonary disease, unspecified: Secondary | ICD-10-CM | POA: Diagnosis not present

## 2019-03-01 DIAGNOSIS — R0600 Dyspnea, unspecified: Secondary | ICD-10-CM | POA: Diagnosis not present

## 2019-03-01 DIAGNOSIS — F4320 Adjustment disorder, unspecified: Secondary | ICD-10-CM | POA: Diagnosis not present

## 2019-03-01 DIAGNOSIS — I5032 Chronic diastolic (congestive) heart failure: Secondary | ICD-10-CM | POA: Diagnosis not present

## 2019-03-01 DIAGNOSIS — R600 Localized edema: Secondary | ICD-10-CM | POA: Diagnosis not present

## 2019-03-01 DIAGNOSIS — I110 Hypertensive heart disease with heart failure: Secondary | ICD-10-CM | POA: Diagnosis not present

## 2019-03-01 DIAGNOSIS — Z9981 Dependence on supplemental oxygen: Secondary | ICD-10-CM | POA: Diagnosis not present

## 2019-03-03 DIAGNOSIS — J441 Chronic obstructive pulmonary disease with (acute) exacerbation: Secondary | ICD-10-CM | POA: Diagnosis not present

## 2019-03-03 DIAGNOSIS — J1281 Pneumonia due to SARS-associated coronavirus: Secondary | ICD-10-CM | POA: Diagnosis not present

## 2019-03-03 DIAGNOSIS — M19042 Primary osteoarthritis, left hand: Secondary | ICD-10-CM | POA: Diagnosis not present

## 2019-03-03 DIAGNOSIS — M19041 Primary osteoarthritis, right hand: Secondary | ICD-10-CM | POA: Diagnosis not present

## 2019-03-03 DIAGNOSIS — J9601 Acute respiratory failure with hypoxia: Secondary | ICD-10-CM | POA: Diagnosis not present

## 2019-03-03 DIAGNOSIS — I480 Paroxysmal atrial fibrillation: Secondary | ICD-10-CM | POA: Diagnosis not present

## 2019-03-03 DIAGNOSIS — I5032 Chronic diastolic (congestive) heart failure: Secondary | ICD-10-CM | POA: Diagnosis not present

## 2019-03-03 DIAGNOSIS — I130 Hypertensive heart and chronic kidney disease with heart failure and stage 1 through stage 4 chronic kidney disease, or unspecified chronic kidney disease: Secondary | ICD-10-CM | POA: Diagnosis not present

## 2019-03-03 DIAGNOSIS — N189 Chronic kidney disease, unspecified: Secondary | ICD-10-CM | POA: Diagnosis not present

## 2019-03-04 DIAGNOSIS — I130 Hypertensive heart and chronic kidney disease with heart failure and stage 1 through stage 4 chronic kidney disease, or unspecified chronic kidney disease: Secondary | ICD-10-CM | POA: Diagnosis not present

## 2019-03-04 DIAGNOSIS — M19041 Primary osteoarthritis, right hand: Secondary | ICD-10-CM | POA: Diagnosis not present

## 2019-03-04 DIAGNOSIS — J9601 Acute respiratory failure with hypoxia: Secondary | ICD-10-CM | POA: Diagnosis not present

## 2019-03-04 DIAGNOSIS — J1281 Pneumonia due to SARS-associated coronavirus: Secondary | ICD-10-CM | POA: Diagnosis not present

## 2019-03-04 DIAGNOSIS — N189 Chronic kidney disease, unspecified: Secondary | ICD-10-CM | POA: Diagnosis not present

## 2019-03-04 DIAGNOSIS — I5032 Chronic diastolic (congestive) heart failure: Secondary | ICD-10-CM | POA: Diagnosis not present

## 2019-03-04 DIAGNOSIS — M19042 Primary osteoarthritis, left hand: Secondary | ICD-10-CM | POA: Diagnosis not present

## 2019-03-04 DIAGNOSIS — I480 Paroxysmal atrial fibrillation: Secondary | ICD-10-CM | POA: Diagnosis not present

## 2019-03-04 DIAGNOSIS — J441 Chronic obstructive pulmonary disease with (acute) exacerbation: Secondary | ICD-10-CM | POA: Diagnosis not present

## 2019-03-14 DIAGNOSIS — U071 COVID-19: Secondary | ICD-10-CM | POA: Diagnosis not present

## 2019-03-17 DIAGNOSIS — J1281 Pneumonia due to SARS-associated coronavirus: Secondary | ICD-10-CM | POA: Diagnosis not present

## 2019-03-17 DIAGNOSIS — M19042 Primary osteoarthritis, left hand: Secondary | ICD-10-CM | POA: Diagnosis not present

## 2019-03-17 DIAGNOSIS — N189 Chronic kidney disease, unspecified: Secondary | ICD-10-CM | POA: Diagnosis not present

## 2019-03-17 DIAGNOSIS — J9601 Acute respiratory failure with hypoxia: Secondary | ICD-10-CM | POA: Diagnosis not present

## 2019-03-17 DIAGNOSIS — M19041 Primary osteoarthritis, right hand: Secondary | ICD-10-CM | POA: Diagnosis not present

## 2019-03-17 DIAGNOSIS — I5032 Chronic diastolic (congestive) heart failure: Secondary | ICD-10-CM | POA: Diagnosis not present

## 2019-03-17 DIAGNOSIS — I480 Paroxysmal atrial fibrillation: Secondary | ICD-10-CM | POA: Diagnosis not present

## 2019-03-17 DIAGNOSIS — I130 Hypertensive heart and chronic kidney disease with heart failure and stage 1 through stage 4 chronic kidney disease, or unspecified chronic kidney disease: Secondary | ICD-10-CM | POA: Diagnosis not present

## 2019-03-17 DIAGNOSIS — J441 Chronic obstructive pulmonary disease with (acute) exacerbation: Secondary | ICD-10-CM | POA: Diagnosis not present

## 2019-03-23 ENCOUNTER — Other Ambulatory Visit: Payer: Self-pay | Admitting: Internal Medicine

## 2019-03-29 ENCOUNTER — Other Ambulatory Visit: Payer: Self-pay

## 2019-03-29 ENCOUNTER — Encounter: Payer: Self-pay | Admitting: Emergency Medicine

## 2019-03-29 ENCOUNTER — Ambulatory Visit: Payer: Medicare HMO | Admitting: Emergency Medicine

## 2019-03-29 VITALS — BP 110/68 | HR 102 | Temp 97.3°F | Ht 63.0 in | Wt 104.8 lb

## 2019-03-29 DIAGNOSIS — R911 Solitary pulmonary nodule: Secondary | ICD-10-CM | POA: Diagnosis not present

## 2019-03-29 MED ORDER — STIOLTO RESPIMAT 2.5-2.5 MCG/ACT IN AERS: 2.0000 | INHALATION_SPRAY | Freq: Every day | RESPIRATORY_TRACT | 0 refills | Status: DC

## 2019-03-29 NOTE — Patient Instructions (Signed)
Please stop Symbicort for now. We will start Stiolto 2 puffs once daily to see if you get more benefit.  If so we will continue with your pharmacy. Keep your albuterol available to use 2 puffs up to every 4 hours if needed for shortness of breath, chest tightness, wheezing.  You can take this before exertion as well Walking oximetry today on room air We will plan to repeat your CT scan of the chest 6 months after your prior to follow small pulmonary opacity Follow with Dr. Lamonte Sakai in 4-6 weeks or sooner if you have any problems.

## 2019-03-29 NOTE — Assessment & Plan Note (Signed)
Significant emphysema on CT chest and notable obstruction on pulmonary function testing from 2015.  She has a progressive decline, is only on LABA/ICS.  We will try a change to Stiolto.  If she misses the ICS component then we could change to Trelegy at some point in the future.  She needs a repeat walking oximetry to ensure that she does not desaturate.  She was sent home from the hospital in October with supplemental oxygen.  Please stop Symbicort for now. We will start Stiolto 2 puffs once daily to see if you get more benefit.  If so we will continue with your pharmacy. Keep your albuterol available to use 2 puffs up to every 4 hours if needed for shortness of breath, chest tightness, wheezing.  You can take this before exertion as well Walking oximetry today on room air Follow with Dr. Lamonte Sakai in 4-6 weeks or sooner if you have any problems.

## 2019-03-29 NOTE — Progress Notes (Signed)
Subjective:    Patient ID: Heather [name], female    DOB: 04/05/1926, 85 y.o.   MRN: [id number]  HPI 85 year old woman with a history of tobacco use and COPD, severe obstruction based on pulmonary function testing 05/31/2013.  I have seen her before in reference to this.  She also has atrial fibrillation, GERD with hiatal hernia, aortic insufficiency, hypothyroidism.  She has been treated with Spiriva and supplemental oxygen in the past. She was admitted with exacerbation of COPD in the setting of COVID-19 viral infection, pneumonitis in October.  She was treated with remdesivir, dexamethasone.  She discharged to home, has had some additional pred since then.  Part of her evaluation included a CT chest done on 10/9 which I have reviewed, shows significant emphysema, stable 3 mm right lower lobe calcified nodule, a rounded anterior medial basilar right middle lobe opacity of unclear significance, possible early nodule.   Her current bronchodilator regimen includes Symbicort, albuterol which she uses a few times a week.  She has used pred but not more than once a year. She went home with O2 but stopped using it and is not seeing any desats. She is not having much wheeze or cough. No sputum production.     Review of Systems  Past Medical History:  Diagnosis Date  . Anxiety   . Arthritis   . Atrial fibrillation ([place])   . Cerumen impaction   . COPD (chronic obstructive pulmonary disease) ([place])   . Dysrhythmia    AF  . Essential hypertension, benign   . Gallstones   . GERD (gastroesophageal reflux disease)   . H/O hiatal hernia   . History of nuclear stress test 10/2010   dipyridamole; normal pattern of perfusion; low risk, normal study  . 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 Mild aortic insufficiency   . Osteoporosis   . Peripheral neuropathy   . Pneumonia    states she had it twice, last time a couple of months ago  . PONV (postoperative nausea and  vomiting)   . Shortness of breath    with exertion  . Unspecified hypothyroidism   . Venous insufficiency      Family History  Problem Relation Age of Onset  . Stroke Mother   . Stroke Father   . Heart block Brother   . Bladder Cancer Brother   . Diabetes Brother   . Stroke Brother      Social History   Socioeconomic History  . Marital status: Married    Spouse name: Not on file  . Number of children: 2  . Years of education: Not on file  . Highest education level: Not on file  Occupational History  . Occupation: Retired Astronomer  Tobacco Use  . Smoking status: Former Smoker    Packs/day: 1.00    Years: 20.00    Pack years: 20.00    Types: Cigarettes    Quit date: 09/12/1978    Years since quitting: 40.5  . Smokeless tobacco: Never Used  Substance and Sexual Activity  . Alcohol use: Yes    Alcohol/week: 0.0 standard drinks    Comment: occ  . Drug use: No  . Sexual activity: Yes  Other Topics Concern  . Not on file  Social History Narrative  . Not on file   Social Determinants of Health   Financial Resource Strain:   . Difficulty of Paying Living Expenses: Not on file  Food Insecurity:   . Worried About Running  Out of Food in the Last Year: Not on file  . Ran Out of Food in the Last Year: Not on file  Transportation Needs:   . Lack of Transportation (Medical): Not on file  . Lack of Transportation (Non-Medical): Not on file  Physical Activity:   . Days of Exercise per Week: Not on file  . Minutes of Exercise per Session: Not on file  Stress:   . Feeling of Stress : Not on file  Social Connections:   . Frequency of Communication with Friends and Family: Not on file  . Frequency of Social Gatherings with Friends and Family: Not on file  . Attends Religious Services: Not on file  . Active Member of Clubs or Organizations: Not on file  . Attends Archivist Meetings: Not on file  . Marital Status: Not on file  Intimate Partner Violence:   .  Fear of Current or Ex-Partner: Not on file  . Emotionally Abused: Not on file  . Physically Abused: Not on file  . Sexually Abused: Not on file     Allergies  Allergen Reactions  . Ace Inhibitors Cough  . Sulfur Nausea And Vomiting     Outpatient Medications Prior to Visit  Medication Sig Dispense Refill  . acetaminophen (TYLENOL) 325 MG tablet Take 2 tablets (650 mg total) by mouth every 6 (six) hours as needed for mild pain or headache (fever >/= 101). 20 tablet 0  . albuterol (PROVENTIL HFA;VENTOLIN HFA) 108 (90 BASE) MCG/ACT inhaler Inhale 2 puffs into the lungs every 6 (six) hours as needed for wheezing or shortness of breath. 1 Inhaler 2  . albuterol (PROVENTIL) (2.5 MG/3ML) 0.083% nebulizer solution Take 2.5 mg by nebulization every 6 (six) hours as needed for wheezing or shortness of breath.    . budesonide-formoterol (SYMBICORT) 160-4.5 MCG/ACT inhaler Inhale 2 puffs into the lungs 2 (two) times daily.     . calcium citrate-vitamin D (CITRACAL+D) 315-200 MG-UNIT per tablet Take 1 tablet by mouth daily.    . Carboxymethylcellulose Sodium (THERATEARS OP) Place 2 drops into both eyes 2 (two) times daily.    Marland Kitchen diltiazem (CARDIZEM CD) 180 MG 24 hr capsule Take 180 mg by mouth daily.    Marland Kitchen ELIQUIS 2.5 MG TABS tablet TAKE 1 TABLET BY MOUTH TWICE DAILY 180 tablet 3  . guaiFENesin-dextromethorphan (ROBITUSSIN DM) 100-10 MG/5ML syrup Take 5 mLs by mouth every 6 (six) hours as needed for cough. 118 mL 0  . hydrochlorothiazide (HYDRODIURIL) 25 MG tablet Take 1 tablet (25 mg total) by mouth daily. 90 tablet 0  . Lactobacillus Reuteri (BIOGAIA PROBIOTIC PO) Take 1 capsule by mouth daily.    Marland Kitchen levothyroxine (SYNTHROID, LEVOTHROID) 137 MCG tablet Take 137 mcg by mouth daily before breakfast.     . losartan (COZAAR) 100 MG tablet TAKE 1 TABLET EVERY DAY (Patient taking differently: Take 50 mg by mouth daily. ) 30 tablet 0  . Multiple Vitamins-Minerals (MULTIVITAMIN WITH MINERALS) tablet Take 1  tablet by mouth daily.    . polyethylene glycol (MIRALAX / GLYCOLAX) packet Take 17 g by mouth every other day.     . Teriparatide, Recombinant, (FORTEO [place]) Inject 1 pen into the skin daily.     No facility-administered medications prior to visit.        Objective:   Physical Exam Vitals:   03/29/19 1331  BP: 110/68  Pulse: (!) 102  Temp: (!) 97.3 F (36.3 C)  TempSrc: Temporal  SpO2: 95%  Weight: 104  lb 12.8 oz (47.5 kg)  Height: 5\' 3"  (1.6 m)   Gen: Pleasant, thin elderly woman, in no distress,  normal affect  ENT: No lesions,  mouth clear,  oropharynx clear, no postnasal drip  Neck: No JVD, no stridor  Lungs: No use of accessory muscles, no crackles or wheezing on normal respiration, no wheeze on forced expiration  Cardiovascular: RRR, heart sounds normal, no murmur or gallops, 1+ ankle peripheral edema  Musculoskeletal: No deformities, no cyanosis or clubbing  Neuro: alert, awake, non focal  Skin: Warm, no lesions or rash     Assessment & Plan:  COPD (chronic obstructive pulmonary disease) (HCC) Significant emphysema on CT chest and notable obstruction on pulmonary function testing from 2015.  She has a progressive decline, is only on LABA/ICS.  We will try a change to Stiolto.  If she misses the ICS component then we could change to Trelegy at some point in the future.  She needs a repeat walking oximetry to ensure that she does not desaturate.  She was sent home from the hospital in October with supplemental oxygen.  Please stop Symbicort for now. We will start Stiolto 2 puffs once daily to see if you get more benefit.  If so we will continue with your pharmacy. Keep your albuterol available to use 2 puffs up to every 4 hours if needed for shortness of breath, chest tightness, wheezing.  You can take this before exertion as well Walking oximetry today on room air Follow with Dr. Lamonte Sakai in 4-6 weeks or sooner if you have any problems.   Pulmonary nodule New  medial basilar right middle lobe opacity, possible evolving small nodule.  Given her age the benefits of invasive testing, tissue diagnosis are less certain.  For now we will plan to repeat the CT in 6 months to look for resolution or other interval change.  Depending on that study and any symptoms we can decide whether other evaluation is warranted.  [name], MD, PhD 03/29/2019, 2:08 PM [facility] [phone] or if no answer [phone]

## 2019-03-29 NOTE — Assessment & Plan Note (Signed)
New medial basilar right middle lobe opacity, possible evolving small nodule.  Given her age the benefits of invasive testing, tissue diagnosis are less certain.  For now we will plan to repeat the CT in 6 months to look for resolution or other interval change.  Depending on that study and any symptoms we can decide whether other evaluation is warranted.

## 2019-03-31 DIAGNOSIS — J449 Chronic obstructive pulmonary disease, unspecified: Secondary | ICD-10-CM | POA: Diagnosis not present

## 2019-03-31 DIAGNOSIS — J441 Chronic obstructive pulmonary disease with (acute) exacerbation: Secondary | ICD-10-CM | POA: Diagnosis not present

## 2019-04-06 ENCOUNTER — Other Ambulatory Visit: Payer: Self-pay

## 2019-04-06 ENCOUNTER — Encounter: Payer: Self-pay | Admitting: *Deleted

## 2019-04-06 MED ORDER — APIXABAN 2.5 MG PO TABS: 2.5000 mg | ORAL_TABLET | Freq: Two times a day (BID) | ORAL | 1 refills | Status: DC

## 2019-04-06 NOTE — Telephone Encounter (Signed)
This encounter was created in error - please disregard.

## 2019-04-14 DIAGNOSIS — U071 COVID-19: Secondary | ICD-10-CM | POA: Diagnosis not present

## 2019-04-21 DIAGNOSIS — Z0000 Encounter for general adult medical examination without abnormal findings: Secondary | ICD-10-CM | POA: Insufficient documentation

## 2019-04-21 DIAGNOSIS — R7301 Impaired fasting glucose: Secondary | ICD-10-CM | POA: Diagnosis not present

## 2019-04-21 DIAGNOSIS — E039 Hypothyroidism, unspecified: Secondary | ICD-10-CM | POA: Diagnosis not present

## 2019-04-21 DIAGNOSIS — I10 Essential (primary) hypertension: Secondary | ICD-10-CM | POA: Diagnosis not present

## 2019-04-21 DIAGNOSIS — M810 Age-related osteoporosis without current pathological fracture: Secondary | ICD-10-CM | POA: Diagnosis not present

## 2019-04-21 NOTE — Telephone Encounter (Signed)
Received fax from [employer] concerning Eliquis assistance application. No part of the application has been submitted. Patient did not return application that was previously mailed. LM for patient asking her to complete/return app if she needs assistance OR if she needs new app, call office and we can mail her one.

## 2019-04-22 ENCOUNTER — Other Ambulatory Visit: Payer: Self-pay

## 2019-04-22 ENCOUNTER — Ambulatory Visit (INDEPENDENT_AMBULATORY_CARE_PROVIDER_SITE_OTHER)
Admission: RE | Admit: 2019-04-22 | Discharge: 2019-04-22 | Disposition: A | Discharge status: Home / Self Care | Payer: Medicare HMO | Source: Ambulatory Visit | Attending: Emergency Medicine | Admitting: Emergency Medicine

## 2019-04-22 DIAGNOSIS — R911 Solitary pulmonary nodule: Secondary | ICD-10-CM

## 2019-04-26 ENCOUNTER — Other Ambulatory Visit: Payer: Self-pay | Admitting: Internal Medicine

## 2019-04-26 ENCOUNTER — Encounter: Payer: Self-pay | Admitting: Emergency Medicine

## 2019-04-26 ENCOUNTER — Ambulatory Visit (INDEPENDENT_AMBULATORY_CARE_PROVIDER_SITE_OTHER): Payer: Medicare HMO | Admitting: Emergency Medicine

## 2019-04-26 ENCOUNTER — Other Ambulatory Visit: Payer: Self-pay

## 2019-04-26 DIAGNOSIS — J449 Chronic obstructive pulmonary disease, unspecified: Secondary | ICD-10-CM

## 2019-04-26 DIAGNOSIS — R911 Solitary pulmonary nodule: Secondary | ICD-10-CM | POA: Diagnosis not present

## 2019-04-26 DIAGNOSIS — J9601 Acute respiratory failure with hypoxia: Secondary | ICD-10-CM | POA: Diagnosis not present

## 2019-04-26 DIAGNOSIS — R82998 Other abnormal findings in urine: Secondary | ICD-10-CM | POA: Diagnosis not present

## 2019-04-26 MED ORDER — APIXABAN 2.5 MG PO TABS: 2.5000 mg | ORAL_TABLET | Freq: Two times a day (BID) | ORAL | 1 refills | Status: DC

## 2019-04-26 MED ORDER — ALBUTEROL SULFATE HFA 108 (90 BASE) MCG/ACT IN AERS: 2.0000 | INHALATION_SPRAY | Freq: Four times a day (QID) | RESPIRATORY_TRACT | 5 refills | Status: DC | PRN

## 2019-04-26 NOTE — Patient Instructions (Addendum)
Agree with continuing Symbicort 2 puffs twice a day.  Rinse and gargle after using. We will refill your albuterol (Proventil) today.  Use 2 puffs up to every 4 hours if needed for shortness of breath, chest tightness, wheezing. We will perform a walking oxygen titration today and then order the latest most portable oxygen for you to use at home that is adequate to keep her saturations at goal. We will plan to repeat your CT scan of the chest in July 2021 to look for interval change in your small pulmonary nodule. Follow with Dr Lamonte Sakai in July after the CT scan to review the results together.

## 2019-04-26 NOTE — Assessment & Plan Note (Signed)
We will titrate her oxygen needs, try to get her more portable system.  If she can tolerate pulsed flow then we will work on getting a POC

## 2019-04-26 NOTE — Addendum Note (Signed)
Addended by: [name] Salts on: 04/26/2019 02:36 PM   Modules accepted: Orders

## 2019-04-26 NOTE — Progress Notes (Signed)
Subjective:    Patient ID: Heather [name], female    DOB: March 27, 1927, 86 y.o.   MRN: [id number]  HPI 86 year old woman with a history of tobacco use and COPD, severe obstruction based on pulmonary function testing 05/31/2013.  I have seen her before in reference to this.  She also has atrial fibrillation, GERD with hiatal hernia, aortic insufficiency, hypothyroidism.  She has been treated with Spiriva and supplemental oxygen in the past. She was admitted with exacerbation of COPD in the setting of COVID-19 viral infection, pneumonitis in October.  She was treated with remdesivir, dexamethasone.  She discharged to home, has had some additional pred since then.  Part of her evaluation included a CT chest done on 10/9 which I have reviewed, shows significant emphysema, stable 3 mm right lower lobe calcified nodule, a rounded anterior medial basilar right middle lobe opacity of unclear significance, possible early nodule.   Her current bronchodilator regimen includes Symbicort, albuterol which she uses a few times a week.  She has used pred but not more than once a year. She went home with O2 but stopped using it and is not seeing any desats. She is not having much wheeze or cough. No sputum production.   ROV 04/26/2019 --follow-up visit for 86 year old woman with COPD and severe obstruction.  I saw her last month for this and also for a right middle lobe opacity, possible evolving pulmonary nodule.  We stopped Symbicort and started Stiolto to see if she would get additional benefit.  She reports today that initially no real change, but then after about 2 weeks her breathing worsened. She stopped it and went back to Symbicort. She has recovered now back to baseline. Still significant exertional limitation.  Repeat CT scan of the chest was done on 1/22 reviewed by me, shows right perihilar spiculated nodule 1.7 x 1.4 cm, possibly slightly enlarged compared with prior from October 2020.   Review of  Systems As above      Objective:   Physical Exam Vitals:   04/26/19 1316  BP: 114/66  Pulse: (!) 102  Temp: 97.9 F (36.6 C)  SpO2: 95%  Weight: 104 lb 3.2 oz (47.3 kg)  Height: 5\' 3"  (1.6 m)   Gen: Pleasant, thin elderly woman, in no distress,  normal affect  ENT: No lesions,  mouth clear,  oropharynx clear, no postnasal drip  Neck: No JVD, no stridor  Lungs: No use of accessory muscles, no crackles or wheezing on normal respiration, no wheeze on forced expiration  Cardiovascular: RRR, heart sounds normal, no murmur or gallops, 1+ ankle peripheral edema  Musculoskeletal: No deformities, no cyanosis or clubbing  Neuro: alert, awake, non focal  Skin: Warm, no lesions or rash     Assessment & Plan:  Pulmonary nodule Spiculated pulmonary nodule, slightly larger between October and January.  She is at risk for primary lung cancer.  In considering the pros and cons of an invasive procedure for biopsy need to consider her age and overall functional capacity.  She is asymptomatic.  I think at this point it would be most reasonable to follow with repeat CT scan in 6 months.  If there is interval enlargement or symptoms develop then we could consider options for diagnosis and or therapy, possibly even empiric XRT therapy.   COPD (chronic obstructive pulmonary disease) ([place]) She did not tolerate Stiolto, felt worse.  Now back to Symbicort.  She has intermittently used prednisone depending on her dyspnea and I have cautioned  her to stop doing this.  She may ultimately require daily prednisone going forward depending on her functional capacity and breathing.  She does need supplemental oxygen we will work on this today  Acute respiratory failure with hypoxemia ([place]) We will titrate her oxygen needs, try to get her more portable system.  If she can tolerate pulsed flow then we will work on getting a POC  [name], MD, PhD 04/26/2019, 1:58 PM [facility] [phone] or if no answer [phone]

## 2019-04-26 NOTE — Assessment & Plan Note (Signed)
She did not tolerate Stiolto, felt worse.  Now back to Symbicort.  She has intermittently used prednisone depending on her dyspnea and I have cautioned her to stop doing this.  She may ultimately require daily prednisone going forward depending on her functional capacity and breathing.  She does need supplemental oxygen we will work on this today

## 2019-04-26 NOTE — Assessment & Plan Note (Signed)
Spiculated pulmonary nodule, slightly larger between October and January.  She is at risk for primary lung cancer.  In considering the pros and cons of an invasive procedure for biopsy need to consider her age and overall functional capacity.  She is asymptomatic.  I think at this point it would be most reasonable to follow with repeat CT scan in 6 months.  If there is interval enlargement or symptoms develop then we could consider options for diagnosis and or therapy, possibly even empiric XRT therapy.

## 2019-04-26 NOTE — Telephone Encounter (Signed)
*  STAT* If patient is at the pharmacy, call can be transferred to refill team.   1. Which medications need to be refilled? (please list name of each medication and dose if known) apixaban (ELIQUIS) 2.5 MG TABS tablet  2. Which pharmacy/location (including street and city if local pharmacy) is medication to be sent to? [facility] ([place] [place] - [address]  3. Do they need a 30 day or 90 day supply? 30 day

## 2019-04-28 DIAGNOSIS — I110 Hypertensive heart disease with heart failure: Secondary | ICD-10-CM | POA: Diagnosis not present

## 2019-04-28 DIAGNOSIS — Z1331 Encounter for screening for depression: Secondary | ICD-10-CM | POA: Diagnosis not present

## 2019-04-28 DIAGNOSIS — Z0000 Encounter for general adult medical examination without abnormal findings: Secondary | ICD-10-CM | POA: Diagnosis not present

## 2019-04-28 DIAGNOSIS — I5032 Chronic diastolic (congestive) heart failure: Secondary | ICD-10-CM | POA: Diagnosis not present

## 2019-04-28 DIAGNOSIS — U071 COVID-19: Secondary | ICD-10-CM | POA: Diagnosis not present

## 2019-04-28 DIAGNOSIS — R7301 Impaired fasting glucose: Secondary | ICD-10-CM | POA: Diagnosis not present

## 2019-04-28 DIAGNOSIS — Z1339 Encounter for screening examination for other mental health and behavioral disorders: Secondary | ICD-10-CM | POA: Diagnosis not present

## 2019-04-28 DIAGNOSIS — Z9981 Dependence on supplemental oxygen: Secondary | ICD-10-CM | POA: Diagnosis not present

## 2019-04-28 DIAGNOSIS — J449 Chronic obstructive pulmonary disease, unspecified: Secondary | ICD-10-CM | POA: Diagnosis not present

## 2019-05-01 DIAGNOSIS — J441 Chronic obstructive pulmonary disease with (acute) exacerbation: Secondary | ICD-10-CM | POA: Diagnosis not present

## 2019-05-01 DIAGNOSIS — J449 Chronic obstructive pulmonary disease, unspecified: Secondary | ICD-10-CM | POA: Diagnosis not present

## 2019-05-10 DIAGNOSIS — J449 Chronic obstructive pulmonary disease, unspecified: Secondary | ICD-10-CM | POA: Diagnosis not present

## 2019-05-12 DIAGNOSIS — H524 Presbyopia: Secondary | ICD-10-CM | POA: Diagnosis not present

## 2019-05-12 DIAGNOSIS — H10413 Chronic giant papillary conjunctivitis, bilateral: Secondary | ICD-10-CM | POA: Diagnosis not present

## 2019-05-12 DIAGNOSIS — H532 Diplopia: Secondary | ICD-10-CM | POA: Diagnosis not present

## 2019-05-15 DIAGNOSIS — J449 Chronic obstructive pulmonary disease, unspecified: Secondary | ICD-10-CM | POA: Diagnosis not present

## 2019-05-20 ENCOUNTER — Encounter: Payer: Self-pay | Admitting: Neurology

## 2019-05-29 DIAGNOSIS — J441 Chronic obstructive pulmonary disease with (acute) exacerbation: Secondary | ICD-10-CM | POA: Diagnosis not present

## 2019-05-29 DIAGNOSIS — J449 Chronic obstructive pulmonary disease, unspecified: Secondary | ICD-10-CM | POA: Diagnosis not present

## 2019-06-02 DIAGNOSIS — I110 Hypertensive heart disease with heart failure: Secondary | ICD-10-CM | POA: Diagnosis not present

## 2019-06-02 DIAGNOSIS — J449 Chronic obstructive pulmonary disease, unspecified: Secondary | ICD-10-CM | POA: Diagnosis not present

## 2019-06-02 DIAGNOSIS — R7301 Impaired fasting glucose: Secondary | ICD-10-CM | POA: Diagnosis not present

## 2019-06-02 DIAGNOSIS — Z9981 Dependence on supplemental oxygen: Secondary | ICD-10-CM | POA: Diagnosis not present

## 2019-06-02 DIAGNOSIS — R911 Solitary pulmonary nodule: Secondary | ICD-10-CM | POA: Diagnosis not present

## 2019-06-02 DIAGNOSIS — Z1331 Encounter for screening for depression: Secondary | ICD-10-CM | POA: Diagnosis not present

## 2019-06-12 DIAGNOSIS — J449 Chronic obstructive pulmonary disease, unspecified: Secondary | ICD-10-CM | POA: Diagnosis not present

## 2019-06-21 ENCOUNTER — Ambulatory Visit (INDEPENDENT_AMBULATORY_CARE_PROVIDER_SITE_OTHER): Payer: Medicare HMO | Admitting: Otolaryngology

## 2019-06-21 ENCOUNTER — Other Ambulatory Visit: Payer: Self-pay

## 2019-06-21 ENCOUNTER — Encounter (INDEPENDENT_AMBULATORY_CARE_PROVIDER_SITE_OTHER): Payer: Self-pay | Admitting: Otolaryngology

## 2019-06-21 VITALS — Temp 97.7°F

## 2019-06-21 DIAGNOSIS — J310 Chronic rhinitis: Secondary | ICD-10-CM | POA: Diagnosis not present

## 2019-06-21 NOTE — Progress Notes (Signed)
HPI: Heather [name] is a 86 y.o. female who returns today for evaluation of complaints of itching in her ears.  She also needs a refill of Flonase.  She also complains of blockage of the left nostril.  She has had occasional sore throat and sometimes chokes easily..  Past Medical History:  Diagnosis Date  . Anxiety   . Arthritis   . Atrial fibrillation ([place])   . Cerumen impaction   . COPD (chronic obstructive pulmonary disease) ([place])   . Dysrhythmia    AF  . Essential hypertension, benign   . Gallstones   . GERD (gastroesophageal reflux disease)   . H/O hiatal hernia   . History of nuclear stress test 10/2010   dipyridamole; normal pattern of perfusion; low risk, normal study  . 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 Mild aortic insufficiency   . Osteoporosis   . Peripheral neuropathy   . Pneumonia    states she had it twice, last time a couple of months ago  . PONV (postoperative nausea and vomiting)   . Shortness of breath    with exertion  . Unspecified hypothyroidism   . Venous insufficiency    Past Surgical History:  Procedure Laterality Date  . APPENDECTOMY  1935  . BACK SURGERY     x2  . Cardiometablic Testing  2/44/0102   submaximal effort with peak RER of 0.5, peak VO2 79% predicted; HR peak up to 78%; PVC was 55% predicted, PEV1 39% predicted; PEV1/VC ratio was reduced; normal vital capacity; DLCO was reduced to 63%  . CARPAL TUNNEL RELEASE    . CATARACT EXTRACTION W/ INTRAOCULAR LENS  IMPLANT, BILATERAL    . CHOLECYSTECTOMY  04/07/2014   dr toth  . CHOLECYSTECTOMY N/A 04/07/2014   Procedure: LAPAROSCOPIC CHOLECYSTECTOMY WITH INTRAOPERATIVE CHOLANGIOGRAM POSSBILE OPEN;  Surgeon: Autumn Messing III, MD;  Location: [place];  Service: General;  Laterality: N/A;  . COLON SURGERY  2008   partial  . ESOPHAGOGASTRODUODENOSCOPY (EGD) WITH PROPOFOL N/A 05/25/2017   Procedure: ESOPHAGOGASTRODUODENOSCOPY (EGD) WITH PROPOFOL;  Surgeon: Clarene Essex,  MD;  Location: [place];  Service: Endoscopy;  Laterality: N/A;  . HERNIA REPAIR    . JOINT REPLACEMENT    . left hip relacement  2000  . SAVORY DILATION N/A 05/25/2017   Procedure: SAVORY DILATION;  Surgeon: Clarene Essex, MD;  Location[facility] ENDOSCOPY;  Service: Endoscopy;  Laterality: N/A;  . TONSILLECTOMY  1935  . TOTAL ABDOMINAL HYSTERECTOMY  1970  . TRANSTHORACIC ECHOCARDIOGRAM  05/2011   EF=>55%; mild conc LVH; mild mitral annular calcif; mild TR; AV mildly sclerotic & mild AR  . VENTRAL HERNIA REPAIR  09/19/2011   Procedure: LAPAROSCOPIC VENTRAL HERNIA;  Surgeon: Merrie Roof, MD;  Location: [place];  Service: General;  Laterality: N/A;  laparoscopic ventral hernia repair with mesh   Social History   Socioeconomic History  . Marital status: Married    Spouse name: Not on file  . Number of children: 2  . Years of education: Not on file  . Highest education level: Not on file  Occupational History  . Occupation: Retired Astronomer  Tobacco Use  . Smoking status: Former Smoker    Packs/day: 1.00    Years: 20.00    Pack years: 20.00    Types: Cigarettes    Start date: 13    Quit date: 09/12/1978    Years since quitting: 40.8  . Smokeless tobacco: Never Used  Substance and Sexual Activity  .  Alcohol use: Yes    Alcohol/week: 0.0 standard drinks    Comment: occ  . Drug use: No  . Sexual activity: Yes  Other Topics Concern  . Not on file  Social History Narrative  . Not on file   Social Determinants of Health   Financial Resource Strain:   . Difficulty of Paying Living Expenses:   Food Insecurity:   . Worried About Charity fundraiser in the Last Year:   . Arboriculturist in the Last Year:   Transportation Needs:   . Film/video editor (Medical):   Marland Kitchen Lack of Transportation (Non-Medical):   Physical Activity:   . Days of Exercise per Week:   . Minutes of Exercise per Session:   Stress:   . Feeling of Stress :   Social Connections:   . Frequency of  Communication with Friends and Family:   . Frequency of Social Gatherings with Friends and Family:   . Attends Religious Services:   . Active Member of Clubs or Organizations:   . Attends Archivist Meetings:   Marland Kitchen Marital Status:    Family History  Problem Relation Age of Onset  . Stroke Mother   . Stroke Father   . Heart block Brother   . Bladder Cancer Brother   . Diabetes Brother   . Stroke Brother    Allergies  Allergen Reactions  . Ace Inhibitors Cough  . Sulfur Nausea And Vomiting   Prior to Admission medications   Medication Sig Start Date End Date Taking? Authorizing Provider  acetaminophen (TYLENOL) 325 MG tablet Take 2 tablets (650 mg total) by mouth every 6 (six) hours as needed for mild pain or headache (fever >/= 101). 01/11/19  Yes Arrien, Jimmy Picket, MD  albuterol (PROVENTIL) (2.5 MG/3ML) 0.083% nebulizer solution Take 2.5 mg by nebulization every 6 (six) hours as needed for wheezing or shortness of breath.   Yes [provider]  albuterol (VENTOLIN HFA) 108 (90 Base) MCG/ACT inhaler Inhale 2 puffs into the lungs every 6 (six) hours as needed for wheezing or shortness of breath. 04/26/19  Yes Byrum, Rose Fillers, MD  apixaban (ELIQUIS) 2.5 MG TABS tablet Take 1 tablet (2.5 mg total) by mouth 2 (two) times daily. 04/26/19  Yes Hilty, Nadean Corwin, MD  budesonide-formoterol (SYMBICORT) 160-4.5 MCG/ACT inhaler Inhale 2 puffs into the lungs 2 (two) times daily.    Yes [provider]  calcium citrate-vitamin D (CITRACAL+D) 315-200 MG-UNIT per tablet Take 1 tablet by mouth daily.   Yes [provider]  Carboxymethylcellulose Sodium (THERATEARS OP) Place 2 drops into both eyes 2 (two) times daily.   Yes [provider]  diltiazem (CARDIZEM CD) 180 MG 24 hr capsule Take 180 mg by mouth daily.   Yes [provider]  furosemide (LASIX) 40 MG tablet Take 40 mg by mouth.   Yes [provider]  guaiFENesin-dextromethorphan  (ROBITUSSIN DM) 100-10 MG/5ML syrup Take 5 mLs by mouth every 6 (six) hours as needed for cough. 01/11/19  Yes Arrien, Jimmy Picket, MD  Lactobacillus Reuteri (BIOGAIA PROBIOTIC PO) Take 1 capsule by mouth daily.   Yes [provider]  levothyroxine (SYNTHROID, LEVOTHROID) 137 MCG tablet Take 137 mcg by mouth daily before breakfast.    Yes [provider]  losartan (COZAAR) 100 MG tablet TAKE 1 TABLET EVERY DAY Patient taking differently: Take 50 mg by mouth daily.  05/11/18  Yes Hilty, Nadean Corwin, MD  Multiple Vitamins-Minerals (MULTIVITAMIN WITH MINERALS)  tablet Take 1 tablet by mouth daily.   Yes [provider]  polyethylene glycol (MIRALAX / GLYCOLAX) packet Take 17 g by mouth every other day.    Yes [provider]  Teriparatide, Recombinant, (FORTEO [place]) Inject 1 pen into the skin daily.   Yes [provider]     Positive ROS: Otherwise negative  All other systems have been reviewed and were otherwise negative with the exception of those mentioned in the HPI and as above.  Physical Exam: Constitutional: Alert, well-appearing, no acute distress Ears: External ears without lesions or tenderness. Ear canals are clear bilaterally.  No signs of external otitis.  TMs are clear bilaterally.  She has slight scaling of the lateral portion of the external auditory canals.  Minimal wax buildup on the left side that was removed. Nasal: External nose without lesions. Septum is deviated to the left.  Mild rhinitis..  Both middle meatus regions are clear with no signs of infection.  Nasal cavity is otherwise clear Oral: Lips and gums without lesions. Tongue and palate mucosa without lesions. Posterior oropharynx clear.  Indirect laryngoscopy revealed a clear base of tongue vallecula epiglottis and vocal cords. Neck: No palpable adenopathy or masses Respiratory: Breathing comfortably  Skin: No facial/neck lesions or rash  noted.  Procedures  Assessment: Chronic rhinitis with septal deviation to the left contributing to nasal obstruction Mild eczema of the lateral portion ear canals.  Plan: Refilled her Flonase. I also prescribed Diprolene 0.05% cream to try in the ears twice daily for 5 days as needed itching.   Radene Journey, MD

## 2019-07-04 NOTE — Progress Notes (Signed)
Virtual Visit via Telephone Note  I connected with Heather [name] on 07/05/19 at 11:00 AM EDT by telephone and verified that I am speaking with the correct person using two identifiers.  Location: Patient: Home Provider: [facility] - [address], [place], [place], [place] [address]   I discussed the limitations, risks, security and privacy concerns of performing an evaluation and management service by telephone and the availability of in person appointments. I also discussed with the patient that there may be a patient responsible charge related to this service. The patient expressed understanding and agreed to proceed.  Patient consented to consult via telephone: Yes People present and their role in pt care: Pt    History of Present Illness:  86 year old female former smoker followed in our office for COPD, SARS-CoV-2 infection requiring hospitalization October/2020, and abnormal CT imaging  Past medical history: A. fib, GERD, hiatal hernia, aortic insufficiency, hypothyroidism, chronic rhinitis  Smoking history: Former smoker.  Quit 1980.  20-pack-year smoking history Maintenance: Symbicort 160, tried Stiolto found to be intolerant Patient of Dr. Lamonte Sakai  Chief complaint: Shortness of Breath   86 year old female former smoker followed in our office for COPD.  They are completing a televisit today.  Patient's daughter is also on the phone of the patient's request.  They have noticed an increased shortness of breath with the patient over the last 2 to 3 weeks.  They deny any other symptoms.  Denies nasal congestion, sputum production, cough, wheezing.  Denies fevers.  Patient does have a tickle in the back of her throat.  She has known chronic rhinitis and is followed by Dr. Lucia Gaskins with the ENT for this.  She continues to be adherent to Symbicort 160.  They report that in the past patient has typically been responsive to prednisone when she has had the symptoms.  They have  not noticed any weight increases.  Weights have been stable.  Patient weighs every day.  Patient does not have to use her rescue inhaler very often.  There is a planned CT ordered for the patient in June or July of this year by Dr. Lamonte Sakai.  Observations/Objective:  04/22/2019-CT chest without contrast-slight enlargement with enlargement over time of a spiculated nodule in the right inferior chest concerning for malignancy, PET scan or biopsy may be helpful as clinically warranted, stable moderate dilatation of ascending thoracic aorta to 3.9 centimeters, recommending annual imaging follow-up, stable dilatation of main pulmonary artery, 3 mm nodule right lung base, stable advanced pulmonary emphysema 01/21/2017-echocardiogram-LV ejection fraction 65 to 44%, grade 1 diastolic dysfunction, aortic valve moderately calcified annulus, moderately thickened and moderately calcified leaflets, trivial regurg, mitral valve mildly to moderately calcified annulus  05/31/2013-pulmonary function test-FVC 2.10 (91% predicted), postbronchodilator ratio 50, postbronchodilator FEV1 1.08 (63% predicted), no bronchodilator response, dlco 15.99 (66 percent predicted)  Social History   Tobacco Use  Smoking Status Former Smoker  . Packs/day: 1.00  . Years: 20.00  . Pack years: 20.00  . Types: Cigarettes  . Start date: 24  . Quit date: 09/12/1978  . Years since quitting: 40.8  Smokeless Tobacco Never Used   Immunization History  Administered Date(s) Administered  . Influenza, High Dose Seasonal PF 11/30/2018  . Influenza-Unspecified 12/29/2013  . PPD Test 05/13/2011  . Pneumococcal Polysaccharide-23 04/08/2014  . Tdap 06/26/2014      Assessment and Plan:  COPD (chronic obstructive pulmonary disease) ([place]) 2 to 3 weeks of increased shortness of breath Denies wheezing Telephone visit today  Plan: Prednisone taper today We will defer antibiotics given no fevers, or sputum production 4-week telephonic  visit or an office visit to ensure symptoms are improving Continue Symbicort 160 Emphasized that if symptoms or not improving or shortness of breath worsens patient is to contact our office  Pulmonary nodule Plan: Continue follow-up as planned by Dr. Lamonte Sakai for June/July/2021 CT of chest   Follow Up Instructions:  Return in about 4 weeks (around 08/02/2019), or if symptoms worsen or fail to improve, for Follow up with Wyn Quaker FNP-C, Follow up with Dr. Lamonte Sakai.   I discussed the assessment and treatment plan with the patient. The patient was provided an opportunity to ask questions and all were answered. The patient agreed with the plan and demonstrated an understanding of the instructions.   The patient was advised to call back or seek an in-person evaluation if the symptoms worsen or if the condition fails to improve as anticipated.  I provided 23 minutes of non-face-to-face time during this encounter.   Heather Rinne, NP

## 2019-07-05 ENCOUNTER — Ambulatory Visit (INDEPENDENT_AMBULATORY_CARE_PROVIDER_SITE_OTHER): Payer: Medicare HMO | Admitting: Pulmonary Disease

## 2019-07-05 ENCOUNTER — Other Ambulatory Visit: Payer: Self-pay

## 2019-07-05 ENCOUNTER — Encounter: Payer: Self-pay | Admitting: Pulmonary Disease

## 2019-07-05 DIAGNOSIS — R911 Solitary pulmonary nodule: Secondary | ICD-10-CM

## 2019-07-05 DIAGNOSIS — J449 Chronic obstructive pulmonary disease, unspecified: Secondary | ICD-10-CM

## 2019-07-05 MED ORDER — PREDNISONE 10 MG PO TABS: ORAL_TABLET | ORAL | 0 refills | Status: DC

## 2019-07-05 NOTE — Assessment & Plan Note (Signed)
2 to 3 weeks of increased shortness of breath Denies wheezing Telephone visit today  Plan: Prednisone taper today We will defer antibiotics given no fevers, or sputum production 4-week telephonic visit or an office visit to ensure symptoms are improving Continue Symbicort 160 Emphasized that if symptoms or not improving or shortness of breath worsens patient is to contact our office

## 2019-07-05 NOTE — Assessment & Plan Note (Signed)
Plan: Continue follow-up as planned by Dr. Lamonte Sakai for June/July/2021 CT of chest

## 2019-07-05 NOTE — Patient Instructions (Addendum)
You were seen today by [name], NP  for:   Nice talking with you today over the phone.  I am sorry that you are having this increased shortness of breath.  We will treat you with prednisone today to see if this helps with symptomatic improvement.  If your symptoms worsen such as increased cough, congestion, fevers or worsening shortness of breath or if you feel like your symptoms are not improving under this current regimen please contact our office so that way we can evaluate you in person and likely obtain chest x-ray imaging.  We will plan on seeing you in 4 weeks either virtually or in office for a follow-up visit to ensure symptoms are improving.  Take care and stay safe,  Heather [name]  1. Chronic obstructive pulmonary disease, unspecified COPD type ([place])  - predniSONE (DELTASONE) 10 MG tablet; 4 tabs for 2 days, then 3 tabs for 2 days, 2 tabs for 2 days, then 1 tab for 2 days, then stop  Dispense: 20 tablet; Refill: 0  Continue Symbicort >>> 2 puffs in the morning right when you wake up, rinse out your mouth after use, 12 hours later 2 puffs, rinse after use >>> Take this daily, no matter what >>> This is not a rescue inhaler   Only use your albuterol as a rescue medication to be used if you can't catch your breath by resting or doing a relaxed purse lip breathing pattern.  - The less you use it, the better it will work when you need it. - Ok to use up to 2 puffs  every 4 hours if you must but call for immediate appointment if use goes up over your usual need - Don't leave home without it !!  (think of it like the spare tire for your car)   Note your daily symptoms > remember "red flags" for COPD:   >>>Increase in cough >>>increase in sputum production >>>increase in shortness of breath or activity  intolerance.   If you notice these symptoms, please call the office to be seen.   2. Pulmonary nodule  Complete planned June/July 2021 CT of chest   We recommend today:   Meds  ordered this encounter  Medications  . predniSONE (DELTASONE) 10 MG tablet    Sig: 4 tabs for 2 days, then 3 tabs for 2 days, 2 tabs for 2 days, then 1 tab for 2 days, then stop    Dispense:  20 tablet    Refill:  0    Follow Up:    Return in about 4 weeks (around 08/02/2019), or if symptoms worsen or fail to improve, for Follow up with Heather Quaker FNP-C, Follow up with Dr. Lamonte Sakai.   Please do your part to reduce the spread of COVID-19:      Reduce your risk of any infection  and COVID19 by using the similar precautions used for avoiding the common cold or flu:  Marland Kitchen Wash your hands often with soap and warm water for at least 20 seconds.  If soap and water are not readily available, use an alcohol-based hand sanitizer with at least 60% alcohol.  . If coughing or sneezing, cover your mouth and nose by coughing or sneezing into the elbow areas of your shirt or coat, into a tissue or into your sleeve (not your hands). Langley Gauss A MASK when in public  . Avoid shaking hands with others and consider head nods or verbal greetings only. . Avoid touching your eyes,  nose, or mouth with unwashed hands.  . Avoid close contact with people who are sick. . Avoid places or events with large numbers of people in one location, like concerts or sporting events. . If you have some symptoms but not all symptoms, continue to monitor at home and seek medical attention if your symptoms worsen. . If you are having a medical emergency, call 911.   [place] / e-Visit: [email or web address]         [facility]: [facility]: [phone]                   [facility]: [phone]     It is flu season:   >>> Best ways to protect herself from the flu: Receive the yearly flu vaccine, practice good hand hygiene washing with soap and also using hand sanitizer when  available, eat a nutritious meals, get adequate rest, hydrate appropriately   Please contact the office if your symptoms worsen or you have concerns that you are not improving.   Thank you for choosing [facility] for your healthcare, and for allowing [place] to partner with you on your healthcare journey. I am thankful to be able to provide care to you today.   Heather Quaker FNP-C

## 2019-07-06 DIAGNOSIS — J449 Chronic obstructive pulmonary disease, unspecified: Secondary | ICD-10-CM | POA: Diagnosis not present

## 2019-07-07 NOTE — Addendum Note (Signed)
Addended by: [name] Salts on: 07/07/2019 03:52 PM   Modules accepted: Orders

## 2019-07-08 ENCOUNTER — Other Ambulatory Visit: Payer: Self-pay

## 2019-07-08 ENCOUNTER — Other Ambulatory Visit: Payer: Medicare HMO

## 2019-07-08 ENCOUNTER — Encounter: Payer: Self-pay | Admitting: Neurology

## 2019-07-08 ENCOUNTER — Ambulatory Visit: Payer: Medicare HMO | Admitting: Neurology

## 2019-07-08 VITALS — BP 120/70 | HR 81 | Resp 20 | Ht 63.0 in | Wt 100.0 lb

## 2019-07-08 DIAGNOSIS — H532 Diplopia: Secondary | ICD-10-CM

## 2019-07-08 NOTE — Progress Notes (Signed)
[facility] Clinic Note - Initial Visit   Date: 07/08/19  Heather [name] MRN: [id number] DOB: Mar 06, 1927   Dear Dr. Delman Cheadle:  Thank you for your kind referral of Heather [name] for consultation of diplopia. Although her history is well known to you, please allow [place] to reiterate it for the purpose of our medical record. The patient was accompanied to the clinic by self.   History of Present Illness: Heather [name] is a 86 y.o. female with atrial fibrlllation, COPD, GERD, neuropathy, and hypertension presenting for evaluation of double vision.  For the past year, she has noticed intermittent double vision, with images side-by-side.  She notices it worse at night time when she is watching TV.  Double vision does not fluctuate throughout the day.  She denies droopiness of the eyelids, difficulty with speech, or facial weakness. Double vision resolves with closing one eye.  She has a long history of dysphagia which has been evaluated by GI.  She is referred to exclude myasthenia as a possible cause, prior to getting fit for prisms.    Past Medical History:  Diagnosis Date  . Anxiety   . Arthritis   . Atrial fibrillation ([place])   . Cerumen impaction   . COPD (chronic obstructive pulmonary disease) ([place])   . Dysrhythmia    AF  . Essential hypertension, benign   . Gallstones   . GERD (gastroesophageal reflux disease)   . H/O hiatal hernia   . History of nuclear stress test 10/2010   dipyridamole; normal pattern of perfusion; low risk, normal study  . 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 Mild aortic insufficiency   . Osteoporosis   . Peripheral neuropathy   . Pneumonia    states she had it twice, last time a couple of months ago  . PONV (postoperative nausea and vomiting)   . Shortness of breath    with exertion  . Unspecified hypothyroidism   . Venous insufficiency     Past Surgical History:  Procedure Laterality  Date  . APPENDECTOMY  1935  . BACK SURGERY     x2  . Cardiometablic Testing  1/93/7902   submaximal effort with peak RER of 0.5, peak VO2 79% predicted; HR peak up to 78%; PVC was 55% predicted, PEV1 39% predicted; PEV1/VC ratio was reduced; normal vital capacity; DLCO was reduced to 63%  . CARPAL TUNNEL RELEASE    . CATARACT EXTRACTION W/ INTRAOCULAR LENS  IMPLANT, BILATERAL    . CHOLECYSTECTOMY  04/07/2014   dr toth  . CHOLECYSTECTOMY N/A 04/07/2014   Procedure: LAPAROSCOPIC CHOLECYSTECTOMY WITH INTRAOPERATIVE CHOLANGIOGRAM POSSBILE OPEN;  Surgeon: Autumn Messing III, MD;  Location: [facility];  Service: General;  Laterality: N/A;  . COLON SURGERY  2008   partial  . ESOPHAGOGASTRODUODENOSCOPY (EGD) WITH PROPOFOL N/A 05/25/2017   Procedure: ESOPHAGOGASTRODUODENOSCOPY (EGD) WITH PROPOFOL;  Surgeon: Clarene Essex, MD;  Location: [facility];  Service: Endoscopy;  Laterality: N/A;  . HERNIA REPAIR    . JOINT REPLACEMENT    . left hip relacement  2000  . SAVORY DILATION N/A 05/25/2017   Procedure: SAVORY DILATION;  Surgeon: Clarene Essex, MD;  Location: [facility];  Service: Endoscopy;  Laterality: N/A;  . TONSILLECTOMY  1935  . TOTAL ABDOMINAL HYSTERECTOMY  1970  . TRANSTHORACIC ECHOCARDIOGRAM  05/2011   EF=>55%; mild conc LVH; mild mitral annular calcif; mild TR; AV mildly sclerotic & mild AR  . VENTRAL HERNIA REPAIR  09/19/2011   Procedure: LAPAROSCOPIC  VENTRAL HERNIA;  Surgeon: Merrie Roof, MD;  Location: [facility];  Service: General;  Laterality: N/A;  laparoscopic ventral hernia repair with mesh     Medications:  Outpatient Encounter Medications as of 07/08/2019  Medication Sig  . acetaminophen (TYLENOL) 325 MG tablet Take 2 tablets (650 mg total) by mouth every 6 (six) hours as needed for mild pain or headache (fever >/= 101).  Marland Kitchen albuterol (PROVENTIL) (2.5 MG/3ML) 0.083% nebulizer solution Take 2.5 mg by nebulization every 6 (six) hours as needed for wheezing or shortness of breath.  Marland Kitchen albuterol  (VENTOLIN HFA) 108 (90 Base) MCG/ACT inhaler Inhale 2 puffs into the lungs every 6 (six) hours as needed for wheezing or shortness of breath.  Marland Kitchen apixaban (ELIQUIS) 2.5 MG TABS tablet Take 1 tablet (2.5 mg total) by mouth 2 (two) times daily.  . budesonide-formoterol (SYMBICORT) 160-4.5 MCG/ACT inhaler Inhale 2 puffs into the lungs 2 (two) times daily.   . calcium citrate-vitamin D (CITRACAL+D) 315-200 MG-UNIT per tablet Take 1 tablet by mouth daily.  . Carboxymethylcellulose Sodium (THERATEARS OP) Place 2 drops into both eyes 2 (two) times daily.  Marland Kitchen diltiazem (CARDIZEM CD) 180 MG 24 hr capsule Take 180 mg by mouth daily.  . fluticasone (FLONASE) 50 MCG/ACT nasal spray   . furosemide (LASIX) 40 MG tablet Take 40 mg by mouth.  Marland Kitchen guaiFENesin-dextromethorphan (ROBITUSSIN DM) 100-10 MG/5ML syrup Take 5 mLs by mouth every 6 (six) hours as needed for cough.  . Lactobacillus Reuteri (BIOGAIA PROBIOTIC PO) Take 1 capsule by mouth daily.  Marland Kitchen levothyroxine (SYNTHROID, LEVOTHROID) 137 MCG tablet Take 137 mcg by mouth daily before breakfast.   . losartan (COZAAR) 100 MG tablet TAKE 1 TABLET EVERY DAY (Patient taking differently: Take 50 mg by mouth daily. )  . losartan (COZAAR) 50 MG tablet   . Multiple Vitamins-Minerals (MULTIVITAMIN WITH MINERALS) tablet Take 1 tablet by mouth daily.  . polyethylene glycol (MIRALAX / GLYCOLAX) packet Take 17 g by mouth every other day.   . Teriparatide, Recombinant, (FORTEO [place]) Inject 1 pen into the skin daily.  . predniSONE (DELTASONE) 10 MG tablet 4 tabs for 2 days, then 3 tabs for 2 days, 2 tabs for 2 days, then 1 tab for 2 days, then stop (Patient not taking: Reported on 07/08/2019)   No facility-administered encounter medications on file as of 07/08/2019.    Allergies:  Allergies  Allergen Reactions  . Ace Inhibitors Cough  . Sulfur Nausea And Vomiting    Family History: Family History  Problem Relation Age of Onset  . Stroke Mother   . Stroke Father   . Heart  block Brother   . Bladder Cancer Brother   . Diabetes Brother   . Stroke Brother     Social History: Social History   Tobacco Use  . Smoking status: Former Smoker    Packs/day: 1.00    Years: 20.00    Pack years: 20.00    Types: Cigarettes    Start date: 56    Quit date: 09/12/1978    Years since quitting: 40.8  . Smokeless tobacco: Never Used  Substance Use Topics  . Alcohol use: Not Currently    Alcohol/week: 0.0 standard drinks    Comment: occ  . Drug use: No   Social History   Social History Narrative   Right handed   One story home   Drinks coffee    Vital Signs:  BP 120/70   Pulse 81   Resp 20  Ht 5\' 3"  (1.6 m)   Wt 100 lb (45.4 kg)   SpO2 90%   BMI 17.71 kg/m   Neurological Exam: MENTAL STATUS including orientation to time, place, person, recent and remote memory, attention span and concentration, language, and fund of knowledge is normal.  Speech is not dysarthric.  CRANIAL NERVES: II:  No visual field defects.   III-IV-VI: Pupils equal round and reactive to light.  Normal conjugate, extra-ocular eye movements in all directions of gaze, except very mildly restricted left lateral gaze in the left eye.  No nystagmus.  No ptosis at rest or with sustained upgaze.   V:  Normal facial sensation.    VII:  Normal facial symmetry and movements.  Motor strength of orbicularis oculi, orbicularis oris, and buccinator is 5/5 VIII:  Normal hearing and vestibular function.   IX-X:  Normal palatal movement.   XI:  Normal shoulder shrug and head rotation.   XII:  Normal tongue strength and range of motion, no deviation or fasciculation.  MOTOR:  Motor strength is 5/5 throughout, no fatigability.  Generalized loss of muscle bulk throughout.  No atrophy, fasciculations or abnormal movements.  No pronator drift.   MSRs:  Right        Left                  brachioradialis 2+  2+  biceps 2+  2+  triceps 2+  2+  patellar 1+  1+  ankle jerk 0  0  Hoffman no  no    plantar response down  down   SENSORY:  Vibration reduced distal to ankles bilaterally.  COORDINATION/GAIT: Normal finger-to- nose-finger. Gait is assisted with a rollator and appears stable.   IMPRESSION: 1.  Monocular diplopia, most likely due to decompensated phoria.  To be complete, I will check acetylcholine receptor antibodies.  If this is normal, it would be reasonable for her to prisms. Overall, suspicion for myasthenia is very low so I do not see value in sending her for single fiber EMG.   2. Peripheral neuropathy, longstanding, likely idiopathic.  Patient educated on daily foot inspection, fall prevention, and safety precautions around the home.    Thank you for allowing me to participate in patient's care.  If I can answer any additional questions, I would be pleased to do so.    Sincerely,    Aries Kasa K. Posey Pronto, DO

## 2019-07-08 NOTE — Patient Instructions (Addendum)
We will call you with your lab results, which usually takes about 10-14 days to come back  Your provider has requested that you have labwork completed today. Please go to [facility] ([id number]) on the second floor of this building before leaving the office today. You do not need to check in. If you are not called within 15 minutes please check with the front desk.

## 2019-07-13 DIAGNOSIS — J449 Chronic obstructive pulmonary disease, unspecified: Secondary | ICD-10-CM | POA: Diagnosis not present

## 2019-07-14 LAB — MYASTHENIA GRAVIS PANEL 2
A CHR BINDING ABS: <0.3 nmol/L
ACHR Blocking Abs: <15 % Inhibition (ref <15)
Acetylchol Modul Ab: 11 % Inhibition

## 2019-07-26 DIAGNOSIS — H532 Diplopia: Secondary | ICD-10-CM | POA: Diagnosis not present

## 2019-07-28 ENCOUNTER — Telehealth: Payer: Self-pay | Admitting: *Deleted

## 2019-07-28 ENCOUNTER — Encounter: Payer: Self-pay | Admitting: Emergency Medicine

## 2019-07-28 ENCOUNTER — Other Ambulatory Visit: Payer: Self-pay

## 2019-07-28 ENCOUNTER — Ambulatory Visit (INDEPENDENT_AMBULATORY_CARE_PROVIDER_SITE_OTHER): Payer: Medicare HMO | Admitting: Emergency Medicine

## 2019-07-28 DIAGNOSIS — J449 Chronic obstructive pulmonary disease, unspecified: Secondary | ICD-10-CM | POA: Diagnosis not present

## 2019-07-28 NOTE — Progress Notes (Signed)
Virtual Visit via Telephone Note  I connected with Heather [name] on 07/28/19 at  2:00 PM EDT by telephone and verified that I am speaking with the correct person using two identifiers.  Location: Patient: Home Provider: Office   I discussed the limitations, risks, security and privacy concerns of performing an evaluation and management service by telephone and the availability of in person appointments. I also discussed with the patient that there may be a patient responsible charge related to this service. The patient expressed understanding and agreed to proceed.   History of Present Illness: 86 year old woman followed for severe COPD, pulmonary nodular disease with a spiculated right perihilar nodule 1.7 x 1.4 cm concerning for possible malignancy.  We have decided to follow this expectantly and with serial CTs, next in July.  She was treated with a prednisone taper earlier this month for an acute exacerbation.  She is currently managed on Symbicort.  Oxygen is at 3-5L/min. She has not had COVID vaccines yet   Observations/Objective: She was having panting prior to the recent prednisone. She reports that she is feeling better, is back to baseline. She is using flonase NS in the evening. She uses albuterol nebs qam, rare albuterol use after that. She wants a backup battery for her inogen POC. She is on 3- 5L/min pulsed. She is due for CT chest in July.   Assessment and Plan: Severe COPD with recent acute exacerbation.  She benefited from prednisone and is now feeling back to baseline.  Continue Symbicort and albuterol as she has been using them.  Chronic hypoxemic respiratory failure.  She uses an antigen POC at 3 to 5 L/min.  She would like to try to get a backup battery and we will work on this for her.  Spiculated right midlung nodule suggestive of primary lung cancer.  Given her age, the pros and cons of either biopsy or therapy we have decided to follow this with serial CT scans.  The  next one will be in July.  We will follow-up afterwards to review the results together, determine whether any maneuvers need to be made based on the pace of change.  Follow Up Instructions: July or prn   I discussed the assessment and treatment plan with the patient. The patient was provided an opportunity to ask questions and all were answered. The patient agreed with the plan and demonstrated an understanding of the instructions.   The patient was advised to call back or seek an in-person evaluation if the symptoms worsen or if the condition fails to improve as anticipated.  I provided 15 minutes of non-face-to-face time during this encounter.   Collene Gobble, MD

## 2019-08-09 DIAGNOSIS — J449 Chronic obstructive pulmonary disease, unspecified: Secondary | ICD-10-CM | POA: Diagnosis not present

## 2019-08-12 DIAGNOSIS — J449 Chronic obstructive pulmonary disease, unspecified: Secondary | ICD-10-CM | POA: Diagnosis not present

## 2019-09-12 DIAGNOSIS — J449 Chronic obstructive pulmonary disease, unspecified: Secondary | ICD-10-CM | POA: Diagnosis not present

## 2019-09-13 ENCOUNTER — Ambulatory Visit: Payer: Medicare HMO | Admitting: Podiatry

## 2019-09-20 DIAGNOSIS — H10412 Chronic giant papillary conjunctivitis, left eye: Secondary | ICD-10-CM | POA: Diagnosis not present

## 2019-09-22 ENCOUNTER — Ambulatory Visit: Payer: Medicare HMO | Admitting: Podiatry

## 2019-09-27 ENCOUNTER — Ambulatory Visit: Payer: Medicare HMO | Admitting: Podiatry

## 2019-09-27 ENCOUNTER — Other Ambulatory Visit: Payer: Self-pay

## 2019-09-27 ENCOUNTER — Encounter: Payer: Self-pay | Admitting: Podiatry

## 2019-09-27 VITALS — Temp 96.3°F

## 2019-09-27 DIAGNOSIS — L853 Xerosis cutis: Secondary | ICD-10-CM

## 2019-09-27 DIAGNOSIS — M79675 Pain in left toe(s): Secondary | ICD-10-CM | POA: Diagnosis not present

## 2019-09-27 DIAGNOSIS — M79674 Pain in right toe(s): Secondary | ICD-10-CM

## 2019-09-27 DIAGNOSIS — R600 Localized edema: Secondary | ICD-10-CM | POA: Diagnosis not present

## 2019-09-27 DIAGNOSIS — B351 Tinea unguium: Secondary | ICD-10-CM

## 2019-09-27 NOTE — Patient Instructions (Signed)
Use Aveeno body wash on feet during shower.  Apply Aquaphor Healing Ointment to feet once daily.   Edema  Edema is an abnormal buildup of fluids in the body tissues and under the skin. Swelling of the legs, feet, and ankles is a common symptom that becomes more likely as you get older. Swelling is also common in looser tissues, like around the eyes. When the affected area is squeezed, the fluid may move out of that spot and leave a dent for a few moments. This dent is called pitting edema. There are many possible causes of edema. Eating too much salt (sodium) and being on your feet or sitting for a long time can cause edema in your legs, feet, and ankles. Hot weather may make edema worse. Common causes of edema include:  Heart failure.  Liver or kidney disease.  Weak leg blood vessels.  Cancer.  An injury.  Pregnancy.  Medicines.  Being obese.  Low protein levels in the blood. Edema is usually painless. Your skin may look swollen or shiny. Follow these instructions at home:  Keep the affected body part raised (elevated) above the level of your heart when you are sitting or lying down.  Do not sit still or stand for long periods of time.  Do not wear tight clothing. Do not wear garters on your upper legs.  Exercise your legs to get your circulation going. This helps to move the fluid back into your blood vessels, and it may help the swelling go down.  Wear elastic bandages or support stockings to reduce swelling as told by your health care provider.  Eat a low-salt (low-sodium) diet to reduce fluid as told by your health care provider.  Depending on the cause of your swelling, you may need to limit how much fluid you drink (fluid restriction).  Take over-the-counter and prescription medicines only as told by your health care provider. Contact a health care provider if:  Your edema does not get better with treatment.  You have heart, liver, or kidney disease and have  symptoms of edema.  You have sudden and unexplained weight gain. Get help right away if:  You develop shortness of breath or chest pain.  You cannot breathe when you lie down.  You develop pain, redness, or warmth in the swollen areas.  You have heart, liver, or kidney disease and suddenly get edema.  You have a fever and your symptoms suddenly get worse. Summary  Edema is an abnormal buildup of fluids in the body tissues and under the skin.  Eating too much salt (sodium) and being on your feet or sitting for a long time can cause edema in your legs, feet, and ankles.  Keep the affected body part raised (elevated) above the level of your heart when you are sitting or lying down. This information is not intended to replace advice given to you by your health care provider. Make sure you discuss any questions you have with your health care provider. Document Revised: 08/04/2018 Document Reviewed: 04/19/2016 Elsevier Patient Education  [place].  Onychomycosis/Fungal Toenails  WHAT IS IT? An infection that lies within the keratin of your nail plate that is caused by a fungus.  WHY ME? Fungal infections affect all ages, sexes, races, and creeds.  There may be many factors that predispose you to a fungal infection such as age, coexisting medical conditions such as diabetes, or an autoimmune disease; stress, medications, fatigue, genetics, etc.  Bottom line: fungus thrives in a  warm, moist environment and your shoes offer such a location.  IS IT CONTAGIOUS? Theoretically, yes.  You do not want to share shoes, nail clippers or files with someone who has fungal toenails.  Walking around barefoot in the same room or sleeping in the same bed is unlikely to transfer the organism.  It is important to realize, however, that fungus can spread easily from one nail to the next on the same foot.  HOW DO WE TREAT THIS?  There are several ways to treat this condition.  Treatment may depend on  many factors such as age, medications, pregnancy, liver and kidney conditions, etc.  It is best to ask your doctor which options are available to you.  No treatment.   Unlike many other medical concerns, you can live with this condition.  However for many people this can be a painful condition and may lead to ingrown toenails or a bacterial infection.  It is recommended that you keep the nails cut short to help reduce the amount of fungal nail. Topical treatment.  These range from herbal remedies to prescription strength nail lacquers.  About 40-50% effective, topicals require twice daily application for approximately 9 to 12 months or until an entirely new nail has grown out.  The most effective topicals are medical grade medications available through physicians offices. Oral antifungal medications.  With an 80-90% cure rate, the most common oral medication requires 3 to 4 months of therapy and stays in your system for a year as the new nail grows out.  Oral antifungal medications do require blood work to make sure it is a safe drug for you.  A liver function panel will be performed prior to starting the medication and after the first month of treatment.  It is important to have the blood work performed to avoid any harmful side effects.  In general, this medication safe but blood work is required. Laser Therapy.  This treatment is performed by applying a specialized laser to the affected nail plate.  This therapy is noninvasive, fast, and non-painful.  It is not covered by insurance and is therefore, out of pocket.  The results have been very good with a 80-95% cure rate.  The [facility] is the only practice in the area to offer this therapy. Permanent Nail Avulsion.  Removing the entire nail so that a new nail will not grow back.   Rash, Adult  A rash is a change in the color of your skin. A rash can also change the way your skin feels. There are many different conditions and factors that can  cause a rash. Follow these instructions at home: The goal of treatment is to stop the itching and keep the rash from spreading. Watch for any changes in your symptoms. Let your doctor know about them. Follow these instructions to help with your condition: Medicine Take or apply over-the-counter and prescription medicines only as told by your doctor. These may include medicines:  To treat red or swollen skin (corticosteroid creams).  To treat itching.  To treat an allergy (oral antihistamines).  To treat very bad symptoms (oral corticosteroids).  Skin care  Put cool cloths (compresses) on the affected areas.  Do not scratch or rub your skin.  Avoid covering the rash. Make sure that the rash is exposed to air as much as possible. Managing itching and discomfort  Avoid hot showers or baths. These can make itching worse. A cold shower may help.  Try taking a  bath with: ? Epsom salts. You can get these at your local pharmacy or grocery store. Follow the instructions on the package. ? Baking soda. Pour a small amount into the bath as told by your doctor. ? Colloidal oatmeal. You can get this at your local pharmacy or grocery store. Follow the instructions on the package.  Try putting baking soda paste onto your skin. Stir water into baking soda until it gets like a paste.  Try putting on a lotion that relieves itchiness (calamine lotion).  Keep cool and out of the sun. Sweating and being hot can make itching worse. General instructions   Rest as needed.  Drink enough fluid to keep your pee (urine) pale yellow.  Wear loose-fitting clothing.  Avoid scented soaps, detergents, and perfumes. Use gentle soaps, detergents, perfumes, and other cosmetic products.  Avoid anything that causes your rash. Keep a journal to help track what causes your rash. Write down: ? What you eat. ? What cosmetic products you use. ? What you drink. ? What you wear. This includes jewelry.  Keep all  follow-up visits as told by your doctor. This is important. Contact a doctor if:  You sweat at night.  You lose weight.  You pee (urinate) more than normal.  You pee less than normal, or you notice that your pee is a darker color than normal.  You feel weak.  You throw up (vomit).  Your skin or the whites of your eyes look yellow (jaundice).  Your skin: ? Tingles. ? Is numb.  Your rash: ? Does not go away after a few days. ? Gets worse.  You are: ? More thirsty than normal. ? More tired than normal.  You have: ? New symptoms. ? Pain in your belly (abdomen). ? A fever. ? Watery poop (diarrhea). Get help right away if:  You have a fever and your symptoms suddenly get worse.  You start to feel mixed up (confused).  You have a very bad headache or a stiff neck.  You have very bad joint pains or stiffness.  You have jerky movements that you cannot control (seizure).  Your rash covers all or most of your body. The rash may or may not be painful.  You have blisters that: ? Are on top of the rash. ? Grow larger. ? Grow together. ? Are painful. ? Are inside your nose or mouth.  You have a rash that: ? Looks like purple pinprick-sized spots all over your body. ? Has a "bull's eye" or looks like a target. ? Is red and painful, causes your skin to peel, and is not from being in the sun too long. Summary  A rash is a change in the color of your skin. A rash can also change the way your skin feels.  The goal of treatment is to stop the itching and keep the rash from spreading.  Take or apply over-the-counter and prescription medicines only as told by your doctor.  Contact a doctor if you have new symptoms or symptoms that get worse.  Keep all follow-up visits as told by your doctor. This is important. This information is not intended to replace advice given to you by your health care provider. Make sure you discuss any questions you have with your health care  provider. Document Revised: 07/09/2018 Document Reviewed: 10/19/2017 Elsevier Patient Education  [place].

## 2019-10-01 NOTE — Progress Notes (Signed)
Subjective: Heather [name] presents today referred by Heather Bunting, MD for complaint of painful thick toenails that are difficult to trim. Pain interferes with ambulation. Aggravating factors include wearing enclosed shoe gear.  She is on blood thinner, Eliquis.  Daughter, Heather [name],  is present during today's visit. Patient also has cracking, itching, and burning of feet which waxes and wanes. She has been using Vaseline Lotion.   Past Medical History:  Diagnosis Date  . Anxiety   . Arthritis   . Atrial fibrillation ([place])   . Cerumen impaction   . COPD (chronic obstructive pulmonary disease) ([place])   . Dysrhythmia    AF  . Essential hypertension, benign   . Gallstones   . GERD (gastroesophageal reflux disease)   . H/O hiatal hernia   . History of nuclear stress test 10/2010   dipyridamole; normal pattern of perfusion; low risk, normal study  . 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 Mild aortic insufficiency   . Osteoporosis   . Peripheral neuropathy   . Pneumonia    states she had it twice, last time a couple of months ago  . PONV (postoperative nausea and vomiting)   . Shortness of breath    with exertion  . Unspecified hypothyroidism   . Venous insufficiency      Patient Active Problem List   Diagnosis Date Noted  . Pulmonary nodule 03/29/2019  . Acute respiratory failure with hypoxemia ([place]) 01/06/2019  . Pneumonia due to COVID-19 virus 01/06/2019  . Hypotension 01/05/2019  . Acute respiratory failure with hypoxia ([place]) 04/27/2018  . Protein-calorie malnutrition, severe 04/26/2018  . Skin avulsion 09/19/2016  . Influenza B 04/16/2016  . Sepsis ([place]) 04/16/2016  . Hypokalemia 04/16/2016  . Hypothyroidism 04/16/2016  . Post herpetic neuralgia 06/06/2014  . Shingles outbreak 04/19/2014  . Gallstones 09/20/2013  . Community acquired pneumonia 05/17/2013  . COPD (chronic obstructive pulmonary disease) ([place]) 05/17/2013  . Dyspnea  on exertion 04/25/2013  . Pulmonary emphysema ([place]) 11/15/2012  . HTN (hypertension) 11/15/2012  . PAF (paroxysmal atrial fibrillation) ([place]) 06/14/2012  . Long term current use of anticoagulant therapy 06/14/2012  . Ventral hernia 07/31/2011  . Abdominal wall mass 07/01/2011     Past Surgical History:  Procedure Laterality Date  . APPENDECTOMY  1935  . BACK SURGERY     x2  . Cardiometablic Testing  2/97/9892   submaximal effort with peak RER of 0.5, peak VO2 79% predicted; HR peak up to 78%; PVC was 55% predicted, PEV1 39% predicted; PEV1/VC ratio was reduced; normal vital capacity; DLCO was reduced to 63%  . CARPAL TUNNEL RELEASE    . CATARACT EXTRACTION W/ INTRAOCULAR LENS  IMPLANT, BILATERAL    . CHOLECYSTECTOMY  04/07/2014   dr toth  . CHOLECYSTECTOMY N/A 04/07/2014   Procedure: LAPAROSCOPIC CHOLECYSTECTOMY WITH INTRAOPERATIVE CHOLANGIOGRAM POSSBILE OPEN;  Surgeon: Autumn Messing III, MD;  Location: [place];  Service: General;  Laterality: N/A;  . COLON SURGERY  2008   partial  . ESOPHAGOGASTRODUODENOSCOPY (EGD) WITH PROPOFOL N/A 05/25/2017   Procedure: ESOPHAGOGASTRODUODENOSCOPY (EGD) WITH PROPOFOL;  Surgeon: Clarene Essex, MD;  Location: [place];  Service: Endoscopy;  Laterality: N/A;  . HERNIA REPAIR    . JOINT REPLACEMENT    . left hip relacement  2000  . SAVORY DILATION N/A 05/25/2017   Procedure: SAVORY DILATION;  Surgeon: Clarene Essex, MD;  Location: [facility] ENDOSCOPY;  Service: Endoscopy;  Laterality: N/A;  . TONSILLECTOMY  1935  . TOTAL ABDOMINAL HYSTERECTOMY  1970  .  TRANSTHORACIC ECHOCARDIOGRAM  05/2011   EF=>55%; mild conc LVH; mild mitral annular calcif; mild TR; AV mildly sclerotic & mild AR  . VENTRAL HERNIA REPAIR  09/19/2011   Procedure: LAPAROSCOPIC VENTRAL HERNIA;  Surgeon: Merrie Roof, MD;  Location: [place];  Service: General;  Laterality: N/A;  laparoscopic ventral hernia repair with mesh     Current Outpatient Medications on File Prior to Visit  Medication Sig  Dispense Refill  . acetaminophen (TYLENOL) 325 MG tablet Take 2 tablets (650 mg total) by mouth every 6 (six) hours as needed for mild pain or headache (fever >/= 101). 20 tablet 0  . albuterol (PROVENTIL) (2.5 MG/3ML) 0.083% nebulizer solution Take 2.5 mg by nebulization every 6 (six) hours as needed for wheezing or shortness of breath.    Marland Kitchen albuterol (VENTOLIN HFA) 108 (90 Base) MCG/ACT inhaler Inhale 2 puffs into the lungs every 6 (six) hours as needed for wheezing or shortness of breath. 18 g 5  . apixaban (ELIQUIS) 2.5 MG TABS tablet Take 1 tablet (2.5 mg total) by mouth 2 (two) times daily. 180 tablet 1  . budesonide-formoterol (SYMBICORT) 160-4.5 MCG/ACT inhaler Inhale 2 puffs into the lungs 2 (two) times daily.     . calcium citrate-vitamin D (CITRACAL+D) 315-200 MG-UNIT per tablet Take 1 tablet by mouth daily.    . Carboxymethylcellulose Sodium (THERATEARS OP) Place 2 drops into both eyes 2 (two) times daily.    Marland Kitchen diltiazem (CARDIZEM CD) 180 MG 24 hr capsule Take 180 mg by mouth daily.    . fluticasone (FLONASE) 50 MCG/ACT nasal spray     . furosemide (LASIX) 40 MG tablet Take 40 mg by mouth.    Marland Kitchen guaiFENesin-dextromethorphan (ROBITUSSIN DM) 100-10 MG/5ML syrup Take 5 mLs by mouth every 6 (six) hours as needed for cough. 118 mL 0  . Lactobacillus Reuteri (BIOGAIA PROBIOTIC PO) Take 1 capsule by mouth daily.    Marland Kitchen levothyroxine (SYNTHROID, LEVOTHROID) 137 MCG tablet Take 137 mcg by mouth daily before breakfast.     . Multiple Vitamins-Minerals (MULTIVITAMIN WITH MINERALS) tablet Take 1 tablet by mouth daily.    Marland Kitchen neomycin-polymyxin b-dexamethasone (MAXITROL) 3.5-10000-0.1 SUSP     . polyethylene glycol (MIRALAX / GLYCOLAX) packet Take 17 g by mouth every other day.     . Teriparatide, Recombinant, (FORTEO [place]) Inject 1 pen into the skin daily.     No current facility-administered medications on file prior to visit.     Allergies  Allergen Reactions  . Ace Inhibitors Cough  . Sulfur  Nausea And Vomiting     Social History   Occupational History  . Occupation: Retired Astronomer  Tobacco Use  . Smoking status: Former Smoker    Packs/day: 1.00    Years: 20.00    Pack years: 20.00    Types: Cigarettes    Start date: 79    Quit date: 09/12/1978    Years since quitting: 41.0  . Smokeless tobacco: Never Used  Vaping Use  . Vaping Use: Never used  Substance and Sexual Activity  . Alcohol use: Not Currently    Alcohol/week: 0.0 standard drinks    Comment: occ  . Drug use: No  . Sexual activity: Yes     Family History  Problem Relation Age of Onset  . Stroke Mother   . Stroke Father   . Heart block Brother   . Bladder Cancer Brother   . Diabetes Brother   . Stroke Brother      Immunization  History  Administered Date(s) Administered  . Influenza, High Dose Seasonal PF 11/30/2018  . Influenza-Unspecified 12/29/2013  . PPD Test 05/13/2011  . Pneumococcal Polysaccharide-23 04/08/2014  . Tdap 06/26/2014     Objective: RUE VALLADARES is a/an 86 y.o. female WD, WN in NAD.Marland Kitchen AAO x 3. Vitals:   09/27/19 1001  Temp: (!) 96.3 F (35.7 C)   Vascular Examination:  Capillary fill time to digits <3 seconds b/l lower extremities. Faintly palpable pedal pulses b/l. Pedal hair absent. Lower extremity skin temperature gradient within normal limits. No pain with calf compression b/l. +1 pitting edema left foot, right foot, left ankle and right ankle.  Dermatological Examination: Pedal skin with normal turgor, texture and tone bilaterally. No open wounds bilaterally. No interdigital macerations bilaterally. Toenails b/l lower extremities and 2-5 bilaterally elongated, discolored, dystrophic, thickened, and crumbly with subungual debris and tenderness to dorsal palpation. Anonychia noted L hallux and R hallux. Nailbed(s) epithelialized.  Pedal skin noted to be dry and flaky b/l lower extremities.  Musculoskeletal: Normal muscle strength 5/5 to all lower extremity  muscle groups bilaterally. No pain crepitus or joint limitation noted with ROM b/l. No gross bony deformities bilaterally.  Neurological: Protective sensation intact 5/5 intact bilaterally with 10g monofilament b/l. Vibratory sensation intact b/l. Proprioception intact bilaterally. Clonus negative b/l.  Assessment: 1. Pain due to onychomycosis of toenails of both feet   2. Localized edema   3. Dry skin dermatitis    Plan: -Examined patient. -Toenails 2-5 bilaterally debrided in length and girth without iatrogenic bleeding with sterile nail nipper and dremel.  -Patient is to use Aveeno body wash on feet during bath/shower. Patient to apply Aquaphor Ointment to both feet once daily. Re-evaulate next visit. -Patient to continue soft, supportive shoe gear daily. -Patient/POA to call should there be question/concern in the interim.  Return in about 3 months (around 12/28/2019).

## 2019-10-12 DIAGNOSIS — J449 Chronic obstructive pulmonary disease, unspecified: Secondary | ICD-10-CM | POA: Diagnosis not present

## 2019-10-13 ENCOUNTER — Other Ambulatory Visit: Payer: Self-pay

## 2019-10-13 ENCOUNTER — Ambulatory Visit (INDEPENDENT_AMBULATORY_CARE_PROVIDER_SITE_OTHER)
Admission: RE | Admit: 2019-10-13 | Discharge: 2019-10-13 | Disposition: A | Discharge status: Home / Self Care | Payer: Medicare HMO | Source: Ambulatory Visit | Attending: Emergency Medicine | Admitting: Emergency Medicine

## 2019-10-13 DIAGNOSIS — R911 Solitary pulmonary nodule: Secondary | ICD-10-CM | POA: Diagnosis not present

## 2019-10-13 DIAGNOSIS — J432 Centrilobular emphysema: Secondary | ICD-10-CM | POA: Diagnosis not present

## 2019-10-13 DIAGNOSIS — I2510 Atherosclerotic heart disease of native coronary artery without angina pectoris: Secondary | ICD-10-CM | POA: Diagnosis not present

## 2019-10-13 DIAGNOSIS — J986 Disorders of diaphragm: Secondary | ICD-10-CM | POA: Diagnosis not present

## 2019-10-13 DIAGNOSIS — I700 Atherosclerosis of aorta: Secondary | ICD-10-CM | POA: Diagnosis not present

## 2019-10-17 ENCOUNTER — Emergency Department (HOSPITAL_COMMUNITY): Payer: Medicare HMO

## 2019-10-17 ENCOUNTER — Encounter (HOSPITAL_COMMUNITY): Payer: Self-pay | Admitting: *Deleted

## 2019-10-17 ENCOUNTER — Inpatient Hospital Stay (HOSPITAL_COMMUNITY)
Admission: EM | Admit: 2019-10-17 | Discharge: 2019-10-21 | DRG: 190 | Disposition: A | Discharge status: Home / Self Care | Payer: Medicare HMO | Attending: Internal Medicine | Admitting: Internal Medicine

## 2019-10-17 DIAGNOSIS — J441 Chronic obstructive pulmonary disease with (acute) exacerbation: Principal | ICD-10-CM | POA: Diagnosis present

## 2019-10-17 DIAGNOSIS — A419 Sepsis, unspecified organism: Secondary | ICD-10-CM | POA: Diagnosis present

## 2019-10-17 DIAGNOSIS — Z7951 Long term (current) use of inhaled steroids: Secondary | ICD-10-CM

## 2019-10-17 DIAGNOSIS — Z882 Allergy status to sulfonamides status: Secondary | ICD-10-CM

## 2019-10-17 DIAGNOSIS — R54 Age-related physical debility: Secondary | ICD-10-CM | POA: Diagnosis present

## 2019-10-17 DIAGNOSIS — Z96642 Presence of left artificial hip joint: Secondary | ICD-10-CM | POA: Diagnosis present

## 2019-10-17 DIAGNOSIS — J80 Acute respiratory distress syndrome: Secondary | ICD-10-CM | POA: Diagnosis not present

## 2019-10-17 DIAGNOSIS — J449 Chronic obstructive pulmonary disease, unspecified: Secondary | ICD-10-CM | POA: Diagnosis not present

## 2019-10-17 DIAGNOSIS — I480 Paroxysmal atrial fibrillation: Secondary | ICD-10-CM | POA: Diagnosis present

## 2019-10-17 DIAGNOSIS — Z8616 Personal history of COVID-19: Secondary | ICD-10-CM

## 2019-10-17 DIAGNOSIS — I4891 Unspecified atrial fibrillation: Secondary | ICD-10-CM | POA: Diagnosis present

## 2019-10-17 DIAGNOSIS — R0600 Dyspnea, unspecified: Secondary | ICD-10-CM | POA: Diagnosis not present

## 2019-10-17 DIAGNOSIS — J189 Pneumonia, unspecified organism: Secondary | ICD-10-CM | POA: Diagnosis not present

## 2019-10-17 DIAGNOSIS — E46 Unspecified protein-calorie malnutrition: Secondary | ICD-10-CM | POA: Diagnosis present

## 2019-10-17 DIAGNOSIS — J9601 Acute respiratory failure with hypoxia: Secondary | ICD-10-CM | POA: Diagnosis not present

## 2019-10-17 DIAGNOSIS — Z20822 Contact with and (suspected) exposure to covid-19: Secondary | ICD-10-CM | POA: Diagnosis not present

## 2019-10-17 DIAGNOSIS — Z9049 Acquired absence of other specified parts of digestive tract: Secondary | ICD-10-CM

## 2019-10-17 DIAGNOSIS — Z79899 Other long term (current) drug therapy: Secondary | ICD-10-CM

## 2019-10-17 DIAGNOSIS — K219 Gastro-esophageal reflux disease without esophagitis: Secondary | ICD-10-CM | POA: Diagnosis present

## 2019-10-17 DIAGNOSIS — I10 Essential (primary) hypertension: Secondary | ICD-10-CM | POA: Diagnosis present

## 2019-10-17 DIAGNOSIS — I351 Nonrheumatic aortic (valve) insufficiency: Secondary | ICD-10-CM | POA: Diagnosis present

## 2019-10-17 DIAGNOSIS — F419 Anxiety disorder, unspecified: Secondary | ICD-10-CM | POA: Diagnosis not present

## 2019-10-17 DIAGNOSIS — B9729 Other coronavirus as the cause of diseases classified elsewhere: Secondary | ICD-10-CM | POA: Diagnosis present

## 2019-10-17 DIAGNOSIS — E039 Hypothyroidism, unspecified: Secondary | ICD-10-CM | POA: Diagnosis not present

## 2019-10-17 DIAGNOSIS — Z9981 Dependence on supplemental oxygen: Secondary | ICD-10-CM | POA: Diagnosis not present

## 2019-10-17 DIAGNOSIS — R0602 Shortness of breath: Secondary | ICD-10-CM | POA: Diagnosis not present

## 2019-10-17 DIAGNOSIS — R000 Tachycardia, unspecified: Secondary | ICD-10-CM | POA: Diagnosis not present

## 2019-10-17 DIAGNOSIS — Z90710 Acquired absence of both cervix and uterus: Secondary | ICD-10-CM

## 2019-10-17 DIAGNOSIS — Z888 Allergy status to other drugs, medicaments and biological substances status: Secondary | ICD-10-CM

## 2019-10-17 DIAGNOSIS — J9621 Acute and chronic respiratory failure with hypoxia: Secondary | ICD-10-CM

## 2019-10-17 DIAGNOSIS — Z681 Body mass index (BMI) 19 or less, adult: Secondary | ICD-10-CM | POA: Diagnosis not present

## 2019-10-17 DIAGNOSIS — Z87891 Personal history of nicotine dependence: Secondary | ICD-10-CM

## 2019-10-17 DIAGNOSIS — R6510 Systemic inflammatory response syndrome (SIRS) of non-infectious origin without acute organ dysfunction: Secondary | ICD-10-CM | POA: Diagnosis not present

## 2019-10-17 DIAGNOSIS — I872 Venous insufficiency (chronic) (peripheral): Secondary | ICD-10-CM | POA: Diagnosis present

## 2019-10-17 DIAGNOSIS — Z66 Do not resuscitate: Secondary | ICD-10-CM | POA: Diagnosis not present

## 2019-10-17 DIAGNOSIS — R600 Localized edema: Secondary | ICD-10-CM | POA: Diagnosis not present

## 2019-10-17 DIAGNOSIS — M810 Age-related osteoporosis without current pathological fracture: Secondary | ICD-10-CM | POA: Diagnosis present

## 2019-10-17 DIAGNOSIS — G629 Polyneuropathy, unspecified: Secondary | ICD-10-CM | POA: Diagnosis present

## 2019-10-17 DIAGNOSIS — Z7901 Long term (current) use of anticoagulants: Secondary | ICD-10-CM

## 2019-10-17 DIAGNOSIS — R0689 Other abnormalities of breathing: Secondary | ICD-10-CM | POA: Diagnosis not present

## 2019-10-17 DIAGNOSIS — Z79890 Hormone replacement therapy (postmenopausal): Secondary | ICD-10-CM

## 2019-10-17 DIAGNOSIS — R0902 Hypoxemia: Secondary | ICD-10-CM | POA: Diagnosis not present

## 2019-10-17 DIAGNOSIS — I5032 Chronic diastolic (congestive) heart failure: Secondary | ICD-10-CM | POA: Diagnosis not present

## 2019-10-17 DIAGNOSIS — R509 Fever, unspecified: Secondary | ICD-10-CM | POA: Diagnosis not present

## 2019-10-17 DIAGNOSIS — Z1152 Encounter for screening for COVID-19: Secondary | ICD-10-CM | POA: Diagnosis not present

## 2019-10-17 DIAGNOSIS — E876 Hypokalemia: Secondary | ICD-10-CM | POA: Diagnosis present

## 2019-10-17 DIAGNOSIS — J439 Emphysema, unspecified: Secondary | ICD-10-CM | POA: Diagnosis not present

## 2019-10-17 DIAGNOSIS — R911 Solitary pulmonary nodule: Secondary | ICD-10-CM | POA: Diagnosis present

## 2019-10-17 DIAGNOSIS — I110 Hypertensive heart disease with heart failure: Secondary | ICD-10-CM | POA: Diagnosis not present

## 2019-10-17 LAB — CBC WITH DIFFERENTIAL/PLATELET
Abs Immature Granulocytes: 0.05 10*3/uL (ref 0.00–0.07)
Basophils Absolute: 0 10*3/uL (ref 0.0–0.1)
Basophils Relative: 0 %
Eosinophils Absolute: 0.1 10*3/uL (ref 0.0–0.5)
Eosinophils Relative: 1 %
HCT: 43.7 % (ref 36.0–46.0)
Hemoglobin: 14.4 g/dL (ref 12.0–15.0)
Immature Granulocytes: 1 %
Lymphocytes Relative: 13 %
Lymphs Abs: 1.3 10*3/uL (ref 0.7–4.0)
MCH: 31.2 pg (ref 26.0–34.0)
MCHC: 33 g/dL (ref 30.0–36.0)
MCV: 94.8 fL (ref 80.0–100.0)
Monocytes Absolute: 0.3 10*3/uL (ref 0.1–1.0)
Monocytes Relative: 3 %
Neutro Abs: 8.5 10*3/uL — ABNORMAL HIGH (ref 1.7–7.7)
Neutrophils Relative %: 82 %
Platelets: 230 10*3/uL (ref 150–400)
RBC: 4.61 MIL/uL (ref 3.87–5.11)
RDW: 13.8 % (ref 11.5–15.5)
WBC: 10.3 10*3/uL (ref 4.0–10.5)
nRBC: 0 % (ref 0.0–0.2)

## 2019-10-17 LAB — LIPASE, BLOOD: Lipase: 32 U/L (ref 11–51)

## 2019-10-17 LAB — RESPIRATORY PANEL BY PCR

## 2019-10-17 LAB — COMPREHENSIVE METABOLIC PANEL
ALT: 25 U/L (ref 0–44)
AST: 27 U/L (ref 15–41)
Albumin: 3.9 g/dL (ref 3.5–5.0)
Alkaline Phosphatase: 82 U/L (ref 38–126)
Anion gap: 11 (ref 5–15)
BUN: 18 mg/dL (ref 8–23)
CO2: 33 mmol/L — ABNORMAL HIGH (ref 22–32)
Calcium: 9.4 mg/dL (ref 8.9–10.3)
Chloride: 93 mmol/L — ABNORMAL LOW (ref 98–111)
Creatinine, Ser: 0.78 mg/dL (ref 0.44–1.00)
GFR calc Af Amer: >60 mL/min (ref >60)
GFR calc non Af Amer: >60 mL/min (ref >60)
Glucose, Bld: 153 mg/dL — ABNORMAL HIGH (ref 70–99)
Potassium: 3.4 mmol/L — ABNORMAL LOW (ref 3.5–5.1)
Sodium: 137 mmol/L (ref 135–145)
Total Bilirubin: 1.3 mg/dL — ABNORMAL HIGH (ref 0.3–1.2)
Total Protein: 6.5 g/dL (ref 6.5–8.1)

## 2019-10-17 LAB — PROTIME-INR
INR: 1.3 — ABNORMAL HIGH (ref 0.8–1.2)
Prothrombin Time: 15.5 seconds — ABNORMAL HIGH (ref 11.4–15.2)

## 2019-10-17 LAB — URINALYSIS, ROUTINE W REFLEX MICROSCOPIC
Bilirubin Urine: NEGATIVE
Glucose, UA: NEGATIVE mg/dL
Hgb urine dipstick: NEGATIVE
Ketones, ur: NEGATIVE mg/dL
Leukocytes,Ua: NEGATIVE
Nitrite: NEGATIVE
Protein, ur: NEGATIVE mg/dL
Specific Gravity, Urine: 1.008 (ref 1.005–1.030)
pH: 7 (ref 5.0–8.0)

## 2019-10-17 LAB — SARS CORONAVIRUS 2 BY RT PCR (HOSPITAL ORDER, PERFORMED IN ~~LOC~~ HOSPITAL LAB): SARS Coronavirus 2: NEGATIVE

## 2019-10-17 LAB — BRAIN NATRIURETIC PEPTIDE: B Natriuretic Peptide: 53.3 pg/mL (ref 0.0–100.0)

## 2019-10-17 LAB — HIV ANTIBODY (ROUTINE TESTING W REFLEX): HIV Screen 4th Generation wRfx: NONREACTIVE

## 2019-10-17 LAB — LACTIC ACID, PLASMA: Lactic Acid, Venous: 1.6 mmol/L (ref 0.5–1.9)

## 2019-10-17 LAB — TROPONIN I (HIGH SENSITIVITY)
Troponin I (High Sensitivity): 8 ng/L (ref <18)
Troponin I (High Sensitivity): 9 ng/L (ref <18)

## 2019-10-17 MED ORDER — ONDANSETRON HCL 4 MG/2ML IJ SOLN: 4.0000 mg | Freq: Four times a day (QID) | INTRAMUSCULAR | Status: DC | PRN

## 2019-10-17 MED ORDER — SODIUM CHLORIDE 0.9 % IV SOLN
500.0000 mg | Freq: Once | INTRAVENOUS | Status: AC
  Administered 2019-10-17: 500 mg via INTRAVENOUS
  Filled 2019-10-17: qty 500

## 2019-10-17 MED ORDER — APIXABAN 2.5 MG PO TABS
2.5000 mg | ORAL_TABLET | Freq: Two times a day (BID) | ORAL | Status: DC
  Administered 2019-10-18 – 2019-10-21 (×8): 2.5 mg via ORAL
  Filled 2019-10-17 (×9): qty 1

## 2019-10-17 MED ORDER — CALCIUM CARBONATE-VITAMIN D 500-200 MG-UNIT PO TABS
1.0000 | ORAL_TABLET | Freq: Every day | ORAL | Status: DC
  Administered 2019-10-18 – 2019-10-21 (×4): 1 via ORAL
  Filled 2019-10-17 (×4): qty 1

## 2019-10-17 MED ORDER — SODIUM CHLORIDE 0.9 % IV SOLN
1.0000 g | INTRAVENOUS | Status: DC
  Administered 2019-10-18: 1 g via INTRAVENOUS
  Filled 2019-10-17: qty 1
  Filled 2019-10-17: qty 10

## 2019-10-17 MED ORDER — FUROSEMIDE 20 MG PO TABS: 40.0000 mg | ORAL_TABLET | Freq: Every morning | ORAL | Status: DC

## 2019-10-17 MED ORDER — METHYLPREDNISOLONE SODIUM SUCC 40 MG IJ SOLR: 40.0000 mg | Freq: Every day | INTRAMUSCULAR | Status: DC

## 2019-10-17 MED ORDER — MAGNESIUM SULFATE 2 GM/50ML IV SOLN
2.0000 g | Freq: Once | INTRAVENOUS | Status: AC
  Administered 2019-10-17: 2 g via INTRAVENOUS
  Filled 2019-10-17: qty 50

## 2019-10-17 MED ORDER — LOSARTAN POTASSIUM 50 MG PO TABS: 25.0000 mg | ORAL_TABLET | Freq: Every morning | ORAL | Status: DC

## 2019-10-17 MED ORDER — ACETAMINOPHEN 325 MG PO TABS
650.0000 mg | ORAL_TABLET | Freq: Once | ORAL | Status: AC
  Administered 2019-10-17: 650 mg via ORAL
  Filled 2019-10-17: qty 2

## 2019-10-17 MED ORDER — SODIUM CHLORIDE 0.9 % IV BOLUS
500.0000 mL | Freq: Once | INTRAVENOUS | Status: AC
  Administered 2019-10-17: 500 mL via INTRAVENOUS

## 2019-10-17 MED ORDER — DILTIAZEM HCL ER COATED BEADS 180 MG PO CP24
180.0000 mg | ORAL_CAPSULE | Freq: Every morning | ORAL | Status: DC
  Administered 2019-10-18 – 2019-10-21 (×4): 180 mg via ORAL
  Filled 2019-10-17 (×4): qty 1

## 2019-10-17 MED ORDER — MOMETASONE FURO-FORMOTEROL FUM 200-5 MCG/ACT IN AERO
2.0000 | INHALATION_SPRAY | Freq: Two times a day (BID) | RESPIRATORY_TRACT | Status: DC
  Filled 2019-10-17: qty 8.8

## 2019-10-17 MED ORDER — POTASSIUM CHLORIDE CRYS ER 20 MEQ PO TBCR
20.0000 meq | EXTENDED_RELEASE_TABLET | Freq: Every morning | ORAL | Status: DC
  Administered 2019-10-18 – 2019-10-21 (×4): 20 meq via ORAL
  Filled 2019-10-17 (×4): qty 1

## 2019-10-17 MED ORDER — SODIUM CHLORIDE 0.9 % IV SOLN
500.0000 mg | INTRAVENOUS | Status: DC
  Administered 2019-10-18: 500 mg via INTRAVENOUS
  Filled 2019-10-17 (×2): qty 500

## 2019-10-17 MED ORDER — SODIUM CHLORIDE 0.9 % IV SOLN: INTRAVENOUS | Status: AC

## 2019-10-17 MED ORDER — ALBUTEROL SULFATE (2.5 MG/3ML) 0.083% IN NEBU
2.5000 mg | INHALATION_SOLUTION | Freq: Once | RESPIRATORY_TRACT | Status: AC
  Administered 2019-10-17: 2.5 mg via RESPIRATORY_TRACT

## 2019-10-17 MED ORDER — SODIUM CHLORIDE 0.9 % IV SOLN
1.0000 g | Freq: Once | INTRAVENOUS | Status: AC
  Administered 2019-10-17: 1 g via INTRAVENOUS
  Filled 2019-10-17: qty 10

## 2019-10-17 MED ORDER — POLYETHYLENE GLYCOL 3350 17 G PO PACK
17.0000 g | PACK | ORAL | Status: DC
  Administered 2019-10-19: 17 g via ORAL
  Filled 2019-10-17: qty 1

## 2019-10-17 MED ORDER — ALBUTEROL SULFATE (2.5 MG/3ML) 0.083% IN NEBU
2.5000 mg | INHALATION_SOLUTION | Freq: Four times a day (QID) | RESPIRATORY_TRACT | Status: DC | PRN
  Administered 2019-10-20: 2.5 mg via RESPIRATORY_TRACT
  Filled 2019-10-17: qty 3

## 2019-10-17 MED ORDER — IPRATROPIUM-ALBUTEROL 0.5-2.5 (3) MG/3ML IN SOLN
3.0000 mL | Freq: Four times a day (QID) | RESPIRATORY_TRACT | Status: DC
  Administered 2019-10-17 – 2019-10-19 (×9): 3 mL via RESPIRATORY_TRACT
  Filled 2019-10-17 (×9): qty 3

## 2019-10-17 MED ORDER — IPRATROPIUM-ALBUTEROL 0.5-2.5 (3) MG/3ML IN SOLN
3.0000 mL | Freq: Once | RESPIRATORY_TRACT | Status: AC
  Administered 2019-10-17: 3 mL via RESPIRATORY_TRACT

## 2019-10-17 MED ORDER — LEVOTHYROXINE SODIUM 25 MCG PO TABS
137.0000 ug | ORAL_TABLET | Freq: Every day | ORAL | Status: DC
  Administered 2019-10-18 – 2019-10-21 (×4): 137 ug via ORAL
  Filled 2019-10-17 (×4): qty 1

## 2019-10-17 NOTE — H&P (Addendum)
History and Physical    Heather [name] UUV:[id number] DOB: 1927-02-21 DOA: 10/17/2019  PCP: Burnard Bunting, MD  Patient coming from: Home, daughter at bedside.  Patient lives with daughter and her husband.  I have personally briefly reviewed patient's old medical records in [place]  Chief Complaint: Worsening shortness of breath  HPI: Heather [name] is a 86 y.o. female with medical history significant for Hx of COPD with chronic hypoxemia on 3L, paroxysmal atrial fibrillation on Eliquis, hypertension, and hypothyroidism who presents with worsening shortness of breath.   For the past 4 days patient has noticed increasing worsening shortness of breath worse with exertion.  Last night could no longer sleep laying down and had to sleep on recline.  Has had increased coughing productive of purulent sputum.  Has felt chills but no fevers.  Son-in-law was sick about 2 days prior to the start of her symptoms.  She normally is on 3 L for COPD and has had to increase it to 5 L.  Has felt somewhat nauseous and has dry heaves today but no actual vomiting.  No diarrhea.  Has had decreased appetite.  She was febrile, tachycardic, tachypneic and had to be placed on 4L.  No leukocytosis and no significant electrolyte derangement.  CXR consistent with COPD.  COVID PCR pending.   She received IV Rocephin and Azithromycin, Mg, 500cc bolus and duoneb x 1 in the ED.  Review of Systems:  Constitutional: No Weight Change, No Fever ENT/Mouth: No sore throat, No Rhinorrhea Eyes: No Eye Pain, No Vision Changes Cardiovascular: No Chest Pain, + SOB,+ Dyspnea on Exertion, No Orthopnea Respiratory:+ Cough, +Sputum, + Wheezing, + Dyspnea  Gastrointestinal: + Nausea, No Vomiting, No Diarrhea, No Constipation, No Pain Genitourinary: No Urgency, No Flank Pain Musculoskeletal: No Arthralgias, No Myalgias Skin: No Skin Lesions, No Pruritus, Neuro: no Weakness, No Numbness Psych: No Anxiety/Panic, No  Depression, + decrease appetite Heme/Lymph: No Bruising, No Bleeding Past Medical History:  Diagnosis Date  . Anxiety   . Arthritis   . Atrial fibrillation ([place])   . Cerumen impaction   . COPD (chronic obstructive pulmonary disease) ([place])   . Dysrhythmia    AF  . Essential hypertension, benign   . Gallstones   . GERD (gastroesophageal reflux disease)   . H/O hiatal hernia   . History of nuclear stress test 10/2010   dipyridamole; normal pattern of perfusion; low risk, normal study  . 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 Mild aortic insufficiency   . Osteoporosis   . Peripheral neuropathy   . Pneumonia    states she had it twice, last time a couple of months ago  . PONV (postoperative nausea and vomiting)   . Shortness of breath    with exertion  . Unspecified hypothyroidism   . Venous insufficiency     Past Surgical History:  Procedure Laterality Date  . APPENDECTOMY  1935  . BACK SURGERY     x2  . Cardiometablic Testing  4/74/2595   submaximal effort with peak RER of 0.5, peak VO2 79% predicted; HR peak up to 78%; PVC was 55% predicted, PEV1 39% predicted; PEV1/VC ratio was reduced; normal vital capacity; DLCO was reduced to 63%  . CARPAL TUNNEL RELEASE    . CATARACT EXTRACTION W/ INTRAOCULAR LENS  IMPLANT, BILATERAL    . CHOLECYSTECTOMY  04/07/2014   dr toth  . CHOLECYSTECTOMY N/A 04/07/2014   Procedure: LAPAROSCOPIC CHOLECYSTECTOMY WITH INTRAOPERATIVE CHOLANGIOGRAM POSSBILE OPEN;  Surgeon:  Autumn Messing III, MD;  Location: [place];  Service: General;  Laterality: N/A;  . COLON SURGERY  2008   partial  . ESOPHAGOGASTRODUODENOSCOPY (EGD) WITH PROPOFOL N/A 05/25/2017   Procedure: ESOPHAGOGASTRODUODENOSCOPY (EGD) WITH PROPOFOL;  Surgeon: Clarene Essex, MD;  Location: [place];  Service: Endoscopy;  Laterality: N/A;  . HERNIA REPAIR    . JOINT REPLACEMENT    . left hip relacement  2000  . SAVORY DILATION N/A 05/25/2017   Procedure: SAVORY  DILATION;  Surgeon: Clarene Essex, MD;  Location: [facility] ENDOSCOPY;  Service: Endoscopy;  Laterality: N/A;  . TONSILLECTOMY  1935  . TOTAL ABDOMINAL HYSTERECTOMY  1970  . TRANSTHORACIC ECHOCARDIOGRAM  05/2011   EF=>55%; mild conc LVH; mild mitral annular calcif; mild TR; AV mildly sclerotic & mild AR  . VENTRAL HERNIA REPAIR  09/19/2011   Procedure: LAPAROSCOPIC VENTRAL HERNIA;  Surgeon: Merrie Roof, MD;  Location: [place];  Service: General;  Laterality: N/A;  laparoscopic ventral hernia repair with mesh     reports that she quit smoking about 41 years ago. Her smoking use included cigarettes. She started smoking about 61 years ago. She has a 20.00 pack-year smoking history. She has never used smokeless tobacco. She reports previous alcohol use. She reports that she does not use drugs.  Allergies  Allergen Reactions  . Ace Inhibitors Cough  . Sulfur Nausea And Vomiting    Family History  Problem Relation Age of Onset  . Stroke Mother   . Stroke Father   . Heart block Brother   . Bladder Cancer Brother   . Diabetes Brother   . Stroke Brother      Prior to Admission medications   Medication Sig Start Date End Date Taking? Authorizing Provider  acetaminophen (TYLENOL) 325 MG tablet Take 2 tablets (650 mg total) by mouth every 6 (six) hours as needed for mild pain or headache (fever >/= 101). 01/11/19   Arrien, Jimmy Picket, MD  albuterol (PROVENTIL) (2.5 MG/3ML) 0.083% nebulizer solution Take 2.5 mg by nebulization every 6 (six) hours as needed for wheezing or shortness of breath.    [provider]  albuterol (VENTOLIN HFA) 108 (90 Base) MCG/ACT inhaler Inhale 2 puffs into the lungs every 6 (six) hours as needed for wheezing or shortness of breath. 04/26/19   Byrum, Rose Fillers, MD  apixaban (ELIQUIS) 2.5 MG TABS tablet Take 1 tablet (2.5 mg total) by mouth 2 (two) times daily. 04/26/19   Hilty, Nadean Corwin, MD  budesonide-formoterol (SYMBICORT) 160-4.5 MCG/ACT inhaler Inhale 2 puffs  into the lungs 2 (two) times daily.     [provider]  calcium citrate-vitamin D (CITRACAL+D) 315-200 MG-UNIT per tablet Take 1 tablet by mouth daily.    [provider]  Carboxymethylcellulose Sodium (THERATEARS OP) Place 2 drops into both eyes 2 (two) times daily.    [provider]  diltiazem (CARDIZEM CD) 180 MG 24 hr capsule Take 180 mg by mouth daily.    [provider]  fluticasone Asencion Islam) 50 MCG/ACT nasal spray  06/21/19   [provider]  furosemide (LASIX) 40 MG tablet Take 40 mg by mouth.    [provider]  guaiFENesin-dextromethorphan (ROBITUSSIN DM) 100-10 MG/5ML syrup Take 5 mLs by mouth every 6 (six) hours as needed for cough. 01/11/19   Arrien, Jimmy Picket, MD  Lactobacillus Reuteri (BIOGAIA PROBIOTIC PO) Take 1 capsule by mouth daily.    [provider]  levothyroxine (SYNTHROID, LEVOTHROID) 137 MCG tablet Take 137 mcg  by mouth daily before breakfast.     [provider]  Multiple Vitamins-Minerals (MULTIVITAMIN WITH MINERALS) tablet Take 1 tablet by mouth daily.    [provider]  neomycin-polymyxin b-dexamethasone (MAXITROL) 3.5-10000-0.1 SUSP  09/20/19   [provider]  polyethylene glycol (MIRALAX / GLYCOLAX) packet Take 17 g by mouth every other day.     [provider]  Teriparatide, Recombinant, (FORTEO [place]) Inject 1 pen into the skin daily.    [provider]    Physical Exam: Vitals:   10/17/19 1625 10/17/19 1626 10/17/19 1628  BP:  (!) 141/81   Pulse:  (!) 103   Resp:  (!) 23   Temp:  (!) 101.4 F (38.6 C)   TempSrc:  Oral   SpO2: 99% 95%   Weight:   46.2 kg  Height:   5\' 3"  (1.6 m)    Constitutional: NAD, calm, comfortable, nontoxic appearing thin frail elderly female sitting at 40 degree incline in bed Vitals:   10/17/19 1625 10/17/19 1626 10/17/19 1628  BP:  (!) 141/81   Pulse:  (!) 103   Resp:  (!) 23   Temp:  (!) 101.4 F (38.6 C)    TempSrc:  Oral   SpO2: 99% 95%   Weight:   46.2 kg  Height:   5\' 3"  (1.6 m)   Eyes: PERRL, lids and conjunctivae normal ENMT: Mucous membranes are moist.  Neck: normal, supple Respiratory: Poor aeration throughout but no wheezing, no crackles. Normal respiratory effort on 4 L O2 via nasal cannula. No accessory muscle use.  Cardiovascular: Regular rate and rhythm, no murmurs / rubs / gallops.  +3 pitting edema distal lower extremity.   Abdomen: no tenderness, no masses palpated. Bowel sounds positive.  Musculoskeletal: no clubbing / cyanosis. No joint deformity upper and lower extremities. Good ROM, no contractures. Normal muscle tone.  Skin: no rashes, lesions, ulcers. No induration Neurologic: CN 2-12 grossly intact. Sensation intact. Strength 5/5 in all 4.  Psychiatric: Normal judgment and insight. Alert and oriented x 3. Normal mood.     Labs on Admission: I have personally reviewed following labs and imaging studies  CBC: Recent Labs  Lab 10/17/19 1700  WBC 10.3  NEUTROABS 8.5*  HGB 14.4  HCT 43.7  MCV 94.8  PLT 962   Basic Metabolic Panel: Recent Labs  Lab 10/17/19 1700  NA 137  K 3.4*  CL 93*  CO2 33*  GLUCOSE 153*  BUN 18  CREATININE 0.78  CALCIUM 9.4   GFR: Estimated Creatinine Clearance: 34.1 mL/min (by C-G formula based on SCr of 0.78 mg/dL). Liver Function Tests: Recent Labs  Lab 10/17/19 1700  AST 27  ALT 25  ALKPHOS 82  BILITOT 1.3*  PROT 6.5  ALBUMIN 3.9   Recent Labs  Lab 10/17/19 1700  LIPASE 32   No results for input(s): AMMONIA in the last 168 hours. Coagulation Profile: Recent Labs  Lab 10/17/19 1700  INR 1.3*   Cardiac Enzymes: No results for input(s): CKTOTAL, CKMB, CKMBINDEX, TROPONINI in the last 168 hours. BNP (last 3 results) No results for input(s): PROBNP in the last 8760 hours. HbA1C: No results for input(s): HGBA1C in the last 72 hours. CBG: No results for input(s): GLUCAP in the last 168 hours. Lipid  Profile: No results for input(s): CHOL, HDL, LDLCALC, TRIG, CHOLHDL, LDLDIRECT in the last 72 hours. Thyroid Function Tests: No results for input(s): TSH, T4TOTAL, FREET4, T3FREE, THYROIDAB in the last 72 hours. Anemia Panel: No results  for input(s): VITAMINB12, FOLATE, FERRITIN, TIBC, IRON, RETICCTPCT in the last 72 hours. Urine analysis:    Component Value Date/Time   COLORURINE YELLOW 10/17/2019 [place] 10/17/2019 1755   LABSPEC 1.008 10/17/2019 1755   PHURINE 7.0 10/17/2019 [place] 10/17/2019 1755   HGBUR NEGATIVE 10/17/2019 1755   [place] 10/17/2019 1755   [place] 10/17/2019 1755   PROTEINUR NEGATIVE 10/17/2019 1755   UROBILINOGEN 0.2 05/17/2013 1207   NITRITE NEGATIVE 10/17/2019 1755   LEUKOCYTESUR NEGATIVE 10/17/2019 1755    Radiological Exams on Admission: DG Chest Port 1 View  Result Date: 10/17/2019 CLINICAL DATA:  SOB. COPD. EXAM: PORTABLE CHEST 1 VIEW COMPARISON:  Chest radiograph 01/06/2019, CT chest 10/13/2019 FINDINGS: Stable cardiomediastinal contours. Persistent asymmetric elevation of the right hemidiaphragm. The lungs are hyperexpanded. Emphysema. No new focal opacity. The patient's known right middle lobe nodule is not well visualized radiographically. No pneumothorax or significant pleural effusion. No acute finding in the visualized skeleton. IMPRESSION: 1. No acute cardiopulmonary finding. 2. COPD and emphysema. 3. The patient's known right middle lobe pulmonary nodule is not well seen radiographically. Electronically Signed   By: Audie Pinto M.D.   On: 10/17/2019 17:17      Assessment/Plan  Sepsis secondary to community acquired pneumonia with COPD exacerbation Continue Rocephin and Azithromycin  Gentle hydration with continuous IV fluids Check RVP for viral infection as well  Acute on chronic hypoxemic respiratory failure secondary to COPD exacerbation Baseline on 3 L DuoNeb scheduled  every 6 Daily IV Solu-Medrol Flutter valve/incentive spirometry  Paroxysmal atrial fibrillation Continue Eliquis and diltiazem  Borderline BP with intermittent hypotension Hold ACE for now and only continue diltiazem for rate control  Hypothyroidism Continue levothyroxine  Protein calorie malnutrition Consult nutrition  DVT prophylaxis:.Eliquis Code Status: DNR Family Communication: Plan discussed with patient and daughter at bedside  disposition Plan: Home with at least 2 midnight stays  Consults called:  Admission status: inpatient   Status is: Inpatient  Remains inpatient appropriate because:Inpatient level of care appropriate due to severity of illness   Dispo: The patient is from: Home              Anticipated d/c is to: Home              Anticipated d/c date is: 3 days              Patient currently is not medically stable to d/c.         Orene Desanctis DO [facility]   If 7PM-7AM, please contact night-coverage [email or web address]   10/17/2019, 6:46 PM

## 2019-10-17 NOTE — ED Provider Notes (Signed)
[facility] Provider Note   CSN: [id number] Arrival date & time: 10/17/19  1613     History No chief complaint on file.   Heather [name] is a 86 y.o. female.  She has a history of COPD is on 3 L of oxygen, history of A. fib on anticoagulation.  Complaining of increased shortness of breath that started 5 days ago.  Cough productive of some yellow thick sputum.  Went to primary care doctor today and found to be febrile.  Received Solu-Medrol by EMS.  Complaining of shortness of breath with any type of exertion.  Complaining of abdominal pain but she said that is old.  No recent falls.  No tobacco.  The history is provided by the patient and the EMS personnel.  Shortness of Breath Severity:  Severe Onset quality:  Gradual Timing:  Constant Progression:  Worsening Chronicity:  Chronic Relieved by:  Nothing Worsened by:  Activity Ineffective treatments:  Oxygen and rest Associated symptoms: abdominal pain, chest pain, cough, fever, sputum production and wheezing   Associated symptoms: no diaphoresis, no headaches, no hemoptysis, no neck pain, no rash, no sore throat, no syncope and no vomiting        Past Medical History:  Diagnosis Date  . Anxiety   . Arthritis   . Atrial fibrillation ([place])   . Cerumen impaction   . COPD (chronic obstructive pulmonary disease) ([place])   . Dysrhythmia    AF  . Essential hypertension, benign   . Gallstones   . GERD (gastroesophageal reflux disease)   . H/O hiatal hernia   . History of nuclear stress test 10/2010   dipyridamole; normal pattern of perfusion; low risk, normal study  . 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 Mild aortic insufficiency   . Osteoporosis   . Peripheral neuropathy   . Pneumonia    states she had it twice, last time a couple of months ago  . PONV (postoperative nausea and vomiting)   . Shortness of breath    with exertion  . Unspecified  hypothyroidism   . Venous insufficiency     Patient Active Problem List   Diagnosis Date Noted  . Pulmonary nodule 03/29/2019  . Acute respiratory failure with hypoxemia ([place]) 01/06/2019  . Pneumonia due to COVID-19 virus 01/06/2019  . Hypotension 01/05/2019  . Acute respiratory failure with hypoxia ([place]) 04/27/2018  . Protein-calorie malnutrition, severe 04/26/2018  . Skin avulsion 09/19/2016  . Influenza B 04/16/2016  . Sepsis ([place]) 04/16/2016  . Hypokalemia 04/16/2016  . Hypothyroidism 04/16/2016  . Post herpetic neuralgia 06/06/2014  . Shingles outbreak 04/19/2014  . Gallstones 09/20/2013  . Community acquired pneumonia 05/17/2013  . COPD (chronic obstructive pulmonary disease) ([place]) 05/17/2013  . Dyspnea on exertion 04/25/2013  . Pulmonary emphysema ([place]) 11/15/2012  . HTN (hypertension) 11/15/2012  . PAF (paroxysmal atrial fibrillation) ([place]) 06/14/2012  . Long term current use of anticoagulant therapy 06/14/2012  . Ventral hernia 07/31/2011  . Abdominal wall mass 07/01/2011    Past Surgical History:  Procedure Laterality Date  . APPENDECTOMY  1935  . BACK SURGERY     x2  . Cardiometablic Testing  2/77/4128   submaximal effort with peak RER of 0.5, peak VO2 79% predicted; HR peak up to 78%; PVC was 55% predicted, PEV1 39% predicted; PEV1/VC ratio was reduced; normal vital capacity; DLCO was reduced to 63%  . CARPAL TUNNEL RELEASE    . CATARACT EXTRACTION W/ INTRAOCULAR LENS  IMPLANT, BILATERAL    . CHOLECYSTECTOMY  04/07/2014   dr toth  . CHOLECYSTECTOMY N/A 04/07/2014   Procedure: LAPAROSCOPIC CHOLECYSTECTOMY WITH INTRAOPERATIVE CHOLANGIOGRAM POSSBILE OPEN;  Surgeon: Autumn Messing III, MD;  Location: [facility];  Service: General;  Laterality: N/A;  . COLON SURGERY  2008   partial  . ESOPHAGOGASTRODUODENOSCOPY (EGD) WITH PROPOFOL N/A 05/25/2017   Procedure: ESOPHAGOGASTRODUODENOSCOPY (EGD) WITH PROPOFOL;  Surgeon: Clarene Essex, MD;  Location: [facility];  Service:  Endoscopy;  Laterality: N/A;  . HERNIA REPAIR    . JOINT REPLACEMENT    . left hip relacement  2000  . SAVORY DILATION N/A 05/25/2017   Procedure: SAVORY DILATION;  Surgeon: Clarene Essex, MD;  Location: [facility] ENDOSCOPY;  Service: Endoscopy;  Laterality: N/A;  . TONSILLECTOMY  1935  . TOTAL ABDOMINAL HYSTERECTOMY  1970  . TRANSTHORACIC ECHOCARDIOGRAM  05/2011   EF=>55%; mild conc LVH; mild mitral annular calcif; mild TR; AV mildly sclerotic & mild AR  . VENTRAL HERNIA REPAIR  09/19/2011   Procedure: LAPAROSCOPIC VENTRAL HERNIA;  Surgeon: Merrie Roof, MD;  Location: [facility];  Service: General;  Laterality: N/A;  laparoscopic ventral hernia repair with mesh     OB History   No obstetric history on file.     Family History  Problem Relation Age of Onset  . Stroke Mother   . Stroke Father   . Heart block Brother   . Bladder Cancer Brother   . Diabetes Brother   . Stroke Brother     Social History   Tobacco Use  . Smoking status: Former Smoker    Packs/day: 1.00    Years: 20.00    Pack years: 20.00    Types: Cigarettes    Start date: 9    Quit date: 09/12/1978    Years since quitting: 41.1  . Smokeless tobacco: Never Used  Vaping Use  . Vaping Use: Never used  Substance Use Topics  . Alcohol use: Not Currently    Alcohol/week: 0.0 standard drinks    Comment: occ  . Drug use: No    Home Medications Prior to Admission medications   Medication Sig Start Date End Date Taking? Authorizing Provider  acetaminophen (TYLENOL) 325 MG tablet Take 2 tablets (650 mg total) by mouth every 6 (six) hours as needed for mild pain or headache (fever >/= 101). 01/11/19   Arrien, Jimmy Picket, MD  albuterol (PROVENTIL) (2.5 MG/3ML) 0.083% nebulizer solution Take 2.5 mg by nebulization every 6 (six) hours as needed for wheezing or shortness of breath.    [provider]  albuterol (VENTOLIN HFA) 108 (90 Base) MCG/ACT inhaler Inhale 2 puffs into the lungs every 6 (six) hours as  needed for wheezing or shortness of breath. 04/26/19   Byrum, Rose Fillers, MD  apixaban (ELIQUIS) 2.5 MG TABS tablet Take 1 tablet (2.5 mg total) by mouth 2 (two) times daily. 04/26/19   Hilty, Nadean Corwin, MD  budesonide-formoterol (SYMBICORT) 160-4.5 MCG/ACT inhaler Inhale 2 puffs into the lungs 2 (two) times daily.     [provider]  calcium citrate-vitamin D (CITRACAL+D) 315-200 MG-UNIT per tablet Take 1 tablet by mouth daily.    [provider]  Carboxymethylcellulose Sodium (THERATEARS OP) Place 2 drops into both eyes 2 (two) times daily.    [provider]  diltiazem (CARDIZEM CD) 180 MG 24 hr capsule Take 180 mg by mouth daily.    [provider]  fluticasone (FLONASE) 50 MCG/ACT nasal spray  06/21/19  [provider]  furosemide (LASIX) 40 MG tablet Take 40 mg by mouth.    [provider]  guaiFENesin-dextromethorphan (ROBITUSSIN DM) 100-10 MG/5ML syrup Take 5 mLs by mouth every 6 (six) hours as needed for cough. 01/11/19   Arrien, Jimmy Picket, MD  Lactobacillus Reuteri (BIOGAIA PROBIOTIC PO) Take 1 capsule by mouth daily.    [provider]  levothyroxine (SYNTHROID, LEVOTHROID) 137 MCG tablet Take 137 mcg by mouth daily before breakfast.     [provider]  Multiple Vitamins-Minerals (MULTIVITAMIN WITH MINERALS) tablet Take 1 tablet by mouth daily.    [provider]  neomycin-polymyxin b-dexamethasone (MAXITROL) 3.5-10000-0.1 SUSP  09/20/19   [provider]  polyethylene glycol (MIRALAX / GLYCOLAX) packet Take 17 g by mouth every other day.     [provider]  Teriparatide, Recombinant, (FORTEO [place]) Inject 1 pen into the skin daily.    [provider]    Allergies    Ace inhibitors and Sulfur  Review of Systems   Review of Systems  Constitutional: Positive for fever. Negative for diaphoresis.  HENT: Negative for sore throat.   Eyes: Negative for visual disturbance.   Respiratory: Positive for cough, sputum production, shortness of breath and wheezing. Negative for hemoptysis.   Cardiovascular: Positive for chest pain. Negative for syncope.  Gastrointestinal: Positive for abdominal pain. Negative for nausea and vomiting.  Genitourinary: Negative for dysuria.  Musculoskeletal: Negative for neck pain.  Skin: Negative for rash.  Neurological: Negative for headaches.    Physical Exam Updated Vital Signs BP (!) 141/81 (BP Location: Right Arm)   Pulse (!) 103   Temp (!) 101.4 F (38.6 C) (Oral)   Resp (!) 23   Ht 5\' 3"  (1.6 m)   Wt 46.2 kg   SpO2 95%   BMI 18.05 kg/m   Physical Exam Vitals and nursing note reviewed.  Constitutional:      General: She is in acute distress.     Appearance: She is well-developed.  HENT:     Head: Normocephalic and atraumatic.  Eyes:     Conjunctiva/sclera: Conjunctivae normal.  Cardiovascular:     Rate and Rhythm: Regular rhythm. Tachycardia present.     Pulses: Normal pulses.     Heart sounds: No murmur heard.   Pulmonary:     Effort: Tachypnea and accessory muscle usage present. No respiratory distress.     Breath sounds: Examination of the right-upper field reveals decreased breath sounds. Examination of the right-lower field reveals rhonchi. Examination of the left-lower field reveals rhonchi. Decreased breath sounds and rhonchi present.  Abdominal:     Palpations: Abdomen is soft.     Tenderness: There is no abdominal tenderness.  Musculoskeletal:        General: Normal range of motion.     Cervical back: Neck supple.     Right lower leg: No edema.     Left lower leg: No edema.  Skin:    General: Skin is warm and dry.     Capillary Refill: Capillary refill takes less than 2 seconds.  Neurological:     General: No focal deficit present.     Mental Status: She is alert.     Sensory: No sensory deficit.     Motor: No weakness.     ED Results / Procedures / Treatments   Labs (all labs ordered  are listed, but only abnormal results are displayed) Labs Reviewed  RESPIRATORY PANEL BY PCR - Abnormal; Notable for the following  components:      Result Value   Coronavirus OC43 DETECTED (*)    All other components within normal limits  COMPREHENSIVE METABOLIC PANEL - Abnormal; Notable for the following components:   Potassium 3.4 (*)    Chloride 93 (*)    CO2 33 (*)    Glucose, Bld 153 (*)    Total Bilirubin 1.3 (*)    All other components within normal limits  CBC WITH DIFFERENTIAL/PLATELET - Abnormal; Notable for the following components:   Neutro Abs 8.5 (*)    All other components within normal limits  PROTIME-INR - Abnormal; Notable for the following components:   Prothrombin Time 15.5 (*)    INR 1.3 (*)    All other components within normal limits  BASIC METABOLIC PANEL - Abnormal; Notable for the following components:   Glucose, Bld 189 (*)    Calcium 8.6 (*)    All other components within normal limits  CBC - Abnormal; Notable for the following components:   WBC 14.1 (*)    All other components within normal limits  CULTURE, BLOOD (ROUTINE X 2)  CULTURE, BLOOD (ROUTINE X 2)  SARS CORONAVIRUS 2 BY RT PCR (HOSPITAL ORDER, [facility])  URINE CULTURE  BRAIN NATRIURETIC PEPTIDE  LIPASE, BLOOD  LACTIC ACID, PLASMA  LACTIC ACID, PLASMA  URINALYSIS, ROUTINE W REFLEX MICROSCOPIC  HIV ANTIBODY (ROUTINE TESTING W REFLEX)  MAGNESIUM  PROCALCITONIN  BLOOD GAS, VENOUS  TROPONIN I (HIGH SENSITIVITY)  TROPONIN I (HIGH SENSITIVITY)    EKG EKG Interpretation  Date/Time:  Monday October 17 2019 16:55:07 EDT Ventricular Rate:  106 PR Interval:    QRS Duration: 82 QT Interval:  311 QTC Calculation: 413 R Axis:   35 Text Interpretation: Sinus tachycardia Artifact in lead(s) II III aVR aVL aVF V1 V2 nonspecific st.ts compared with prior 10/20 Confirmed by Aletta Edouard [phone]) on 10/17/2019 4:57:40 PM   Radiology DG Chest Port 1 View  Result  Date: 10/17/2019 CLINICAL DATA:  SOB. COPD. EXAM: PORTABLE CHEST 1 VIEW COMPARISON:  Chest radiograph 01/06/2019, CT chest 10/13/2019 FINDINGS: Stable cardiomediastinal contours. Persistent asymmetric elevation of the right hemidiaphragm. The lungs are hyperexpanded. Emphysema. No new focal opacity. The patient's known right middle lobe nodule is not well visualized radiographically. No pneumothorax or significant pleural effusion. No acute finding in the visualized skeleton. IMPRESSION: 1. No acute cardiopulmonary finding. 2. COPD and emphysema. 3. The patient's known right middle lobe pulmonary nodule is not well seen radiographically. Electronically Signed   By: Audie Pinto M.D.   On: 10/17/2019 17:17    Procedures .Critical Care Performed by: Hayden Rasmussen, MD Authorized by: Hayden Rasmussen, MD   Critical care provider statement:    Critical care time (minutes):  45   Critical care time was exclusive of:  Separately billable procedures and treating other patients   Critical care was necessary to treat or prevent imminent or life-threatening deterioration of the following conditions:  Respiratory failure (SIRS)   Critical care was time spent personally by me on the following activities:  Discussions with consultants, evaluation of patient's response to treatment, examination of patient, ordering and performing treatments and interventions, ordering and review of laboratory studies, ordering and review of radiographic studies, pulse oximetry, re-evaluation of patient's condition, obtaining history from patient or surrogate, review of old charts and development of treatment plan with patient or surrogate   I assumed direction of critical care for this patient from another provider in my  specialty: no     (including critical care time)  Medications Ordered in ED Medications  ipratropium-albuterol (DUONEB) 0.5-2.5 (3) MG/3ML nebulizer solution 3 mL (has no administration in time range)   albuterol (PROVENTIL) (2.5 MG/3ML) 0.083% nebulizer solution 2.5 mg (has no administration in time range)  magnesium sulfate IVPB 2 g 50 mL (has no administration in time range)    ED Course  I have reviewed the triage vital signs and the nursing notes.  Pertinent labs & imaging results that were available during my care of the patient were reviewed by me and considered in my medical decision making (see chart for details).  Clinical Course as of Oct 17 1099  Mon Oct 17, 2019  1642 Patient's daughter here now and confirm most of what the patient is able to give her history.  Uses oxygen as needed with exertion.  Has needed it more the last few days and constant today.  Had a chest x-ray that did not show an obvious pneumonia at the PCPs office today.  Had a low-grade temp at the office.  Had Covid back in October and has not had the vaccine.   [MB]  3149 Chest x-ray interpreted by me as elevated right hemidiaphragm no gross infiltrates similar to prior   [MB]  1826 Discussed with [facility] hospitalist Dr. Flossie Buffy who will evaluate the patient for admission.  I updated the patient and her daughter regarding plan for admission and they are in agreement.   [MB]    Clinical Course User Index [MB] Hayden Rasmussen, MD   MDM Rules/Calculators/A&P                         This patient complains of shortness of breath dyspnea on exertion fever; this involves an extensive number of treatment Options and is a complaint that carries with it a high risk of complications and Morbidity. The differential includes pneumonia, bronchitis, COPD exacerbation, Covid, sepsis  I ordered, reviewed and interpreted labs, which included CBC with elevated white count, normal hemoglobin, chemistries normal other than elevated glucose and mildly low calcium, troponin not significantly elevated, similar with BNP.  Lactate not elevated.  Covid testing negative I ordered medication IV fluids, Tylenol, ceftriaxone and Zithromax  IV, breathing treatments, IV magnesium.  Patient had received steroids by EMS already. I ordered imaging studies which included chest x-ray and I independently    visualized and interpreted imaging which showed elevated right hemidiaphragm similar to prior no acute infiltrates Additional history obtained from patient's daughter Previous records obtained and reviewed in epic, follows with pulmonary and reviewed their notes I consulted Dr. Flossie Buffy [facility] hospitalist and discussed lab and imaging findings  Critical Interventions: Recognition and work-up of SIRS and respiratory distress with early antibiotics and IV magnesium respiratory treatments  After the interventions stated above, I reevaluated the patient and found patient's breathing to be improved.  She will need to be admitted to the hospital for further management of COPD exacerbation and bronchitis, SIRS.  Heather [name] was evaluated in Emergency Department on 10/17/2019 for the symptoms described in the history of present illness. She was evaluated in the context of the global COVID-19 pandemic, which necessitated consideration that the patient might be at risk for infection with the SARS-CoV-2 virus that causes COVID-19. Institutional protocols and algorithms that pertain to the evaluation of patients at risk for COVID-19 are in a state of rapid change based on information released by regulatory bodies including  the [employer] and federal and state organizations. These policies and algorithms were followed during the patient's care in the ED.  CHA2DS2/VAS Stroke Risk Points  Current as of 9 minutes ago     5 >= 2 Points: High Risk  1 - 1.99 Points: Medium Risk  0 Points: Low Risk    Last Change: N/A      Details    This score determines the patient's risk of having a stroke if the  patient has atrial fibrillation.       Points Metrics  1 Has Congestive Heart Failure:  Yes     Current as of 9 minutes ago  0 Has Vascular Disease:  No     Current as of 9 minutes ago  1 Has Hypertension:  Yes    Current as of 9 minutes ago  2 Age:  43    Current as of 9 minutes ago  0 Has Diabetes:  No    Current as of 9 minutes ago  0 Had Stroke:  No  Had TIA:  No  Had thromboembolism:  No    Current as of 9 minutes ago  1 Female:  Yes    Current as of 9 minutes ago            Final Clinical Impression(s) / ED Diagnoses Final diagnoses:  SIRS (systemic inflammatory response syndrome) (HCC)  Acute respiratory failure with hypoxia (HCC)  COPD with acute exacerbation ([place])    Rx / DC Orders ED Discharge Orders    None       Hayden Rasmussen, MD 10/18/19 1108

## 2019-10-17 NOTE — ED Triage Notes (Signed)
Pt sent from PCP because of SHOB and fever . Pt uses Nasal O2 at 3 liters continus, Pt received a dou- neb in rout to ED . Pt reports a productive cough with yellow spum. EMS vitals . EMS temp 105.5, SPO2 98% 3 liters nasal O2. EMS gave 125 solumedrol.

## 2019-10-18 ENCOUNTER — Other Ambulatory Visit: Payer: Self-pay

## 2019-10-18 ENCOUNTER — Encounter (HOSPITAL_COMMUNITY): Payer: Self-pay | Admitting: Family Medicine

## 2019-10-18 DIAGNOSIS — J9621 Acute and chronic respiratory failure with hypoxia: Secondary | ICD-10-CM

## 2019-10-18 LAB — BASIC METABOLIC PANEL
Anion gap: 7 (ref 5–15)
BUN: 18 mg/dL (ref 8–23)
CO2: 31 mmol/L (ref 22–32)
Calcium: 8.6 mg/dL — ABNORMAL LOW (ref 8.9–10.3)
Chloride: 100 mmol/L (ref 98–111)
Creatinine, Ser: 0.69 mg/dL (ref 0.44–1.00)
GFR calc Af Amer: >60 mL/min (ref >60)
GFR calc non Af Amer: >60 mL/min (ref >60)
Glucose, Bld: 189 mg/dL — ABNORMAL HIGH (ref 70–99)
Potassium: 4.1 mmol/L (ref 3.5–5.1)
Sodium: 138 mmol/L (ref 135–145)

## 2019-10-18 LAB — CBC
HCT: 39.9 % (ref 36.0–46.0)
Hemoglobin: 13 g/dL (ref 12.0–15.0)
MCH: 31.3 pg (ref 26.0–34.0)
MCHC: 32.6 g/dL (ref 30.0–36.0)
MCV: 95.9 fL (ref 80.0–100.0)
Platelets: 208 10*3/uL (ref 150–400)
RBC: 4.16 MIL/uL (ref 3.87–5.11)
RDW: 13.8 % (ref 11.5–15.5)
WBC: 14.1 10*3/uL — ABNORMAL HIGH (ref 4.0–10.5)
nRBC: 0 % (ref 0.0–0.2)

## 2019-10-18 LAB — URINE CULTURE

## 2019-10-18 LAB — LACTIC ACID, PLASMA: Lactic Acid, Venous: 1.6 mmol/L (ref 0.5–1.9)

## 2019-10-18 LAB — PROCALCITONIN: Procalcitonin: 9.37 ng/mL

## 2019-10-18 LAB — MAGNESIUM: Magnesium: 2.3 mg/dL (ref 1.7–2.4)

## 2019-10-18 MED ORDER — METHYLPREDNISOLONE SODIUM SUCC 40 MG IJ SOLR
40.0000 mg | Freq: Two times a day (BID) | INTRAMUSCULAR | Status: DC
  Administered 2019-10-18 – 2019-10-21 (×7): 40 mg via INTRAVENOUS
  Filled 2019-10-18 (×7): qty 1

## 2019-10-18 MED ORDER — BUDESONIDE 0.25 MG/2ML IN SUSP
0.2500 mg | Freq: Two times a day (BID) | RESPIRATORY_TRACT | Status: DC
  Administered 2019-10-18 – 2019-10-21 (×7): 0.25 mg via RESPIRATORY_TRACT
  Filled 2019-10-18 (×7): qty 2

## 2019-10-18 MED ORDER — ARFORMOTEROL TARTRATE 15 MCG/2ML IN NEBU
15.0000 ug | INHALATION_SOLUTION | Freq: Two times a day (BID) | RESPIRATORY_TRACT | Status: DC
  Administered 2019-10-18 – 2019-10-21 (×7): 15 ug via RESPIRATORY_TRACT
  Filled 2019-10-18 (×8): qty 2

## 2019-10-18 NOTE — Progress Notes (Signed)
Pt is on HFNC, SOB and receiving a breathing treatment.  O2 sats are 99%, but pt is worried about trying to get up.  Will reattempt as pt is able to tolerate it.   10/18/19 1100  PT Visit Information  Last PT Received On 10/18/19  Reason Eval/Treat Not Completed Medical issues which prohibited therapy    Mee Hives, PT MS Acute Rehab Dept. Number: [place] and [place]

## 2019-10-18 NOTE — Progress Notes (Signed)
PROGRESS NOTE  Heather [name] XTG:[id number] DOB: May 07, 1926 DOA: 10/17/2019 PCP: Burnard Bunting, MD  Brief History:  86 year old female with a history of chronic respiratory failure on 3 L, severe COPD, paroxysmal atrial fibrillation on apixaban, hypothyroidism, hypertension, peripheral neuropathy presenting with 4-day history of shortness of breath and decreased oral intake.  She is complaining of increasing cough with yellow sputum.  She denies any mopped assist.  The patient has had some subjective chills.  She states that she was exposed to her son-in-law who had a similar illness 2 days prior to the onset of her own symptoms.  She increased her home oxygen demand from 3 to 5 L at home.  She continued to have shortness of breath.  To result, she presented for further evaluation.  Chest x-ray in the emergency department did not show any consolidations.  It showed a chronically elevated right hemidiaphragm with hyperinflation.  BMP and LFTs were unremarkable.  WBC 10.3, hemoglobin 14.4, platelets 230,000.  The patient was started on bronchodilators and IV Solu-Medrol for COPD exacerbation.  Assessment/Plan: Acute on chronic respiratory failure with hypoxia -Secondary COPD exacerbation -Initially on 5 L -Wean back to 3 L which is her usual baseline demand at home  COPD exacerbation -Triggered by coronavirus infection (NOT SARS-CoV2) -Viral respiratory panel positive for coronavirus OC 43 -Increase IV Medrol to 40 mg IV twice daily -Add Pulmicort -Continue duo nebs -Add Brovana  SIRS -Sepsis ruled out -Lactic acid 1.6 -Personally reviewed chest x-ray--no consolidation -Initially presented with tachycardia and fever up to 1 to 101.4 F  Paroxysmal atrial fibrillation -Currently in sinus rhythm -Continue Cardizem CD 180 mg daily -Continue apixaban  Right middle lobe lung nodule -Noted on CT 10/13/2019 -This was a surveillance CT ordered by the patient's  pulmonologist--Dr. Byrum -Nodule measured 2.0 x 1.3 cm (previously 1.8 x 1.2 cm) -Outpatient follow-up  Hypothyroidism -Continue levothyroxine  Essential hypertension -Continue Cardizem CD  Hypokalemia -Replete -Check magnesium  Chest pain -atypical by hx -personally reviewed EKG--sinus, nonspecific ST changes -troponin 8>>9      Status is: Inpatient  Remains inpatient appropriate because:IV treatments appropriate due to intensity of illness or inability to take PO   Dispo: The patient is from: Home              Anticipated d/c is to: Home              Anticipated d/c date is: 2 days              Patient currently is not medically stable to d/c.        Family Communication: no  Family at bedside  Consultants:  none  Code Status:  DNR  DVT Prophylaxis:  apixaban   Procedures: As Listed in Progress Note Above  Antibiotics: azithro 7/19>>> Ceftriaxone 7/19      Subjective: Patient states that her shortness of breath is not much worse than yesterday but not a whole lot better.  She still has a cough with yellow sputum.  She denies any hemoptysis.  There is no fever, chills, chest pain, nausea, vomiting, diarrhea, domino pain.  Objective: Vitals:   10/18/19 0500 10/18/19 0530 10/18/19 0600 10/18/19 0615  BP: 123/62 105/62 (!) 122/59 103/67  Pulse: 85 88 97 87  Resp: (!) 23 20 (!) 21 13  Temp:      TempSrc:      SpO2: 93% (!) 89% 94% 96%  Weight:  Height:        Intake/Output Summary (Last 24 hours) at 10/18/2019 0750 Last data filed at 10/17/2019 2020 Gross per 24 hour  Intake 750 ml  Output --  Net 750 ml   Weight change:  Exam:   General:  Pt is alert, follows commands appropriately, not in acute distress  HEENT: No icterus, No thrush, No neck mass, [place]/AT  Cardiovascular: RRR, S1/S2, no rubs, no gallops  Respiratory:bilateral rales and wheeze.  Diminished breath wounds  Abdomen: Soft/+BS, non tender, non distended, no  guarding  Extremities: 1+LE  edema, No lymphangitis, No petechiae, No rashes, no synovitis   Data Reviewed: I have personally reviewed following labs and imaging studies Basic Metabolic Panel: Recent Labs  Lab 10/17/19 1700  NA 137  K 3.4*  CL 93*  CO2 33*  GLUCOSE 153*  BUN 18  CREATININE 0.78  CALCIUM 9.4   Liver Function Tests: Recent Labs  Lab 10/17/19 1700  AST 27  ALT 25  ALKPHOS 82  BILITOT 1.3*  PROT 6.5  ALBUMIN 3.9   Recent Labs  Lab 10/17/19 1700  LIPASE 32   No results for input(s): AMMONIA in the last 168 hours. Coagulation Profile: Recent Labs  Lab 10/17/19 1700  INR 1.3*   CBC: Recent Labs  Lab 10/17/19 1700  WBC 10.3  NEUTROABS 8.5*  HGB 14.4  HCT 43.7  MCV 94.8  PLT 230   Cardiac Enzymes: No results for input(s): CKTOTAL, CKMB, CKMBINDEX, TROPONINI in the last 168 hours. BNP: Invalid input(s): POCBNP CBG: No results for input(s): GLUCAP in the last 168 hours. HbA1C: No results for input(s): HGBA1C in the last 72 hours. Urine analysis:    Component Value Date/Time   COLORURINE YELLOW 10/17/2019 [place] 10/17/2019 1755   LABSPEC 1.008 10/17/2019 1755   PHURINE 7.0 10/17/2019 [place] 10/17/2019 1755   HGBUR NEGATIVE 10/17/2019 1755   [place] 10/17/2019 1755   [place] 10/17/2019 1755   PROTEINUR NEGATIVE 10/17/2019 1755   UROBILINOGEN 0.2 05/17/2013 1207   NITRITE NEGATIVE 10/17/2019 1755   LEUKOCYTESUR NEGATIVE 10/17/2019 1755   Sepsis Labs: @LABRCNTIP (procalcitonin:4,lacticidven:4) ) Recent Results (from the past 240 hour(s))  Culture, blood (routine x 2)     Status: None (Preliminary result)   Collection Time: 10/17/19  4:48 PM   Specimen: BLOOD  Result Value Ref Range Status   Specimen Description BLOOD SITE NOT SPECIFIED  Final   Special Requests   Final    BOTTLES DRAWN AEROBIC AND ANAEROBIC Blood Culture adequate volume   Culture   Final    NO GROWTH  < 12 HOURS Performed at [facility], [place] [address], [place], [place] [address]    Report Status PENDING  Incomplete  Culture, blood (routine x 2)     Status: None (Preliminary result)   Collection Time: 10/17/19  5:00 PM   Specimen: BLOOD RIGHT ARM  Result Value Ref Range Status   Specimen Description BLOOD RIGHT ARM  Final   Special Requests   Final    BOTTLES DRAWN AEROBIC ONLY Blood Culture results may not be optimal due to an inadequate volume of blood received in culture bottles   Culture   Final    NO GROWTH < 12 HOURS Performed at [facility], [place] [address], [place], [place] [address]    Report Status PENDING  Incomplete  SARS Coronavirus 2 by RT PCR (hospital order, performed in [facility] hospital  lab) Nasopharyngeal Nasopharyngeal Swab     Status: None   Collection Time: 10/17/19  7:19 PM   Specimen: Nasopharyngeal Swab  Result Value Ref Range Status   SARS Coronavirus 2 NEGATIVE NEGATIVE Final    Comment: (NOTE) SARS-CoV-2 target nucleic acids are NOT DETECTED.  The SARS-CoV-2 RNA is generally detectable in upper and lower respiratory specimens during the acute phase of infection. The lowest concentration of SARS-CoV-2 viral copies this assay can detect is 250 copies / mL. A negative result does not preclude SARS-CoV-2 infection and should not be used as the sole basis for treatment or other patient management decisions.  A negative result may occur with improper specimen collection / handling, submission of specimen other than nasopharyngeal swab, presence of viral mutation(s) within the areas targeted by this assay, and inadequate number of viral copies (<250 copies / mL). A negative result must be combined with clinical observations, patient history, and epidemiological information.  Fact Sheet for Patients:   StrictlyIdeas.no  Fact Sheet for Healthcare Providers: BankingDealers.co.za  This test is  not yet approved or  cleared by the [place] FDA and has been authorized for detection and/or diagnosis of SARS-CoV-2 by FDA under an Emergency Use Authorization (EUA).  This EUA will remain in effect (meaning this test can be used) for the duration of the COVID-19 declaration under Section 564(b)(1) of the Act, 21 U.S.C. section 360bbb-3(b)(1), unless the authorization is terminated or revoked sooner.  Performed at [facility], [place] [address] [place], [place] [address]   Respiratory Panel by PCR     Status: Abnormal   Collection Time: 10/17/19  7:19 PM   Specimen: Nasopharyngeal Swab; Respiratory  Result Value Ref Range Status   Adenovirus NOT DETECTED NOT DETECTED Final   Coronavirus 229E NOT DETECTED NOT DETECTED Final    Comment: (NOTE) The Coronavirus on the Respiratory Panel, DOES NOT test for the novel  Coronavirus (2019 nCoV)    Coronavirus HKU1 NOT DETECTED NOT DETECTED Final   Coronavirus NL63 NOT DETECTED NOT DETECTED Final   Coronavirus OC43 DETECTED (A) NOT DETECTED Final   Metapneumovirus NOT DETECTED NOT DETECTED Final   Rhinovirus / Enterovirus NOT DETECTED NOT DETECTED Final   Influenza A NOT DETECTED NOT DETECTED Final   Influenza B NOT DETECTED NOT DETECTED Final   Parainfluenza Virus 1 NOT DETECTED NOT DETECTED Final   Parainfluenza Virus 2 NOT DETECTED NOT DETECTED Final   Parainfluenza Virus 3 NOT DETECTED NOT DETECTED Final   Parainfluenza Virus 4 NOT DETECTED NOT DETECTED Final   Respiratory Syncytial Virus NOT DETECTED NOT DETECTED Final   Bordetella pertussis NOT DETECTED NOT DETECTED Final   Chlamydophila pneumoniae NOT DETECTED NOT DETECTED Final   Mycoplasma pneumoniae NOT DETECTED NOT DETECTED Final    Comment: Performed at [facility], [place] [address], [place], [place] [address]     Scheduled Meds:  apixaban  2.5 mg Oral BID PC   arformoterol  15 mcg Nebulization BID   budesonide (PULMICORT) nebulizer solution  0.25 mg  Nebulization BID   calcium-vitamin D  1 tablet Oral Daily   diltiazem  180 mg Oral q morning - 10a   furosemide  40 mg Oral q morning - 10a   ipratropium-albuterol  3 mL Nebulization Q6H   levothyroxine  137 mcg Oral QAC breakfast   methylPREDNISolone (SOLU-MEDROL) injection  40 mg Intravenous Q12H   [START ON 10/19/2019] polyethylene glycol  17 g Oral QODAY   potassium chloride SA  20 mEq Oral q morning - 10a   Continuous Infusions:  azithromycin     cefTRIAXone (ROCEPHIN)  IV      Procedures/Studies: DG Chest Port 1 View  Result Date: 10/17/2019 CLINICAL DATA:  SOB. COPD. EXAM: PORTABLE CHEST 1 VIEW COMPARISON:  Chest radiograph 01/06/2019, CT chest 10/13/2019 FINDINGS: Stable cardiomediastinal contours. Persistent asymmetric elevation of the right hemidiaphragm. The lungs are hyperexpanded. Emphysema. No new focal opacity. The patient's known right middle lobe nodule is not well visualized radiographically. No pneumothorax or significant pleural effusion. No acute finding in the visualized skeleton. IMPRESSION: 1. No acute cardiopulmonary finding. 2. COPD and emphysema. 3. The patient's known right middle lobe pulmonary nodule is not well seen radiographically. Electronically Signed   By: Audie Pinto M.D.   On: 10/17/2019 17:17   CT Super D Chest Wo Contrast  Result Date: 10/13/2019 CLINICAL DATA:  Follow-up pulmonary nodule. EXAM: CT CHEST WITHOUT CONTRAST TECHNIQUE: Multidetector CT imaging of the chest was performed using thin slice collimation for electromagnetic bronchoscopy planning purposes, without intravenous contrast. COMPARISON:  04/22/2019 FINDINGS: Cardiovascular: The heart size is normal. Aortic atherosclerosis noted. Three vessel coronary artery calcifications. No pericardial effusion. Mediastinum/Nodes: No enlarged mediastinal, hilar, or axillary lymph nodes. Thyroid gland, trachea, and esophagus demonstrate no significant findings. Lungs/Pleura: Asymmetric  elevation of right hemidiaphragm is again noted. No pleural effusion. Severe changes of centrilobular and paraseptal emphysema. Atelectasis versus scar noted within the lingula and right lower lobe, unchanged. Pulmonary nodule, with small central calcification, within the medial right middle lobe measures 2.0 by 1.3 cm, image 125/2. This is compared with 1.8 x 1.2 cm (when remeasured). On 01/25/18 this measured 0.7 x 0.8 cm. Upper Abdomen: Extensive aortic atherosclerosis is identified within the imaged portions of the upper abdomen. Evidence of branch vessel involvement is noted. No acute abnormality identified. Musculoskeletal: The bones appear diffusely osteopenic. Multi level compression deformities within the mid and lower thoracic spine are unchanged. No new compression fractures multiple healed left lateral rib deformities are identified, likely posttraumatic. IMPRESSION: 1. Continual gradual increase in size of right middle lobe lung nodule which remains worrisome for pulmonary malignancy. 2. Emphysema and aortic atherosclerosis. 3. Three vessel coronary artery calcifications noted. Aortic Atherosclerosis (ICD10-I70.0) and Emphysema (ICD10-J43.9). Electronically Signed   By: Kerby Moors M.D.   On: 10/13/2019 15:58    Orson Eva, DO  [facility]  If 7PM-7AM, please contact night-coverage [email or web address] Password [email or web address] 10/18/2019, 7:50 AM   LOS: 1 day

## 2019-10-18 NOTE — ED Notes (Signed)
Lunch Tray Ordered @ 1045 

## 2019-10-18 NOTE — ED Notes (Signed)
Report given to RN on 2W 

## 2019-10-18 NOTE — ED Notes (Signed)
Pt HOB >65 degrees, pt SPO2 95% on 2L O2 therapy, pt has productive cough with yellow thick sputum. Pt respirations labored with coughing. Pt able to speak in full sentences, pt states " I can sleep ok in between my coughing spells."

## 2019-10-18 NOTE — Progress Notes (Signed)
PT Cancellation Note  Patient Details Name: Heather [name] MRN: [id number] DOB: Oct 14, 1926   Cancelled Treatment:    Reason Eval/Treat Not Completed: Medical issues which prohibited therapy.  Pt is still desaturating with any effort, and agreed to check on her tomorrow per nursing's recommendations.     Ramond Dial 10/18/2019, 2:30 PM   Mee Hives, PT MS Acute Rehab Dept. Number: [place]

## 2019-10-19 LAB — MAGNESIUM: Magnesium: 2.2 mg/dL (ref 1.7–2.4)

## 2019-10-19 LAB — CBC
HCT: 40.7 % (ref 36.0–46.0)
Hemoglobin: 13.4 g/dL (ref 12.0–15.0)
MCH: 31.4 pg (ref 26.0–34.0)
MCHC: 32.9 g/dL (ref 30.0–36.0)
MCV: 95.3 fL (ref 80.0–100.0)
Platelets: 215 10*3/uL (ref 150–400)
RBC: 4.27 MIL/uL (ref 3.87–5.11)
RDW: 13.6 % (ref 11.5–15.5)
WBC: 13.8 10*3/uL — ABNORMAL HIGH (ref 4.0–10.5)
nRBC: 0 % (ref 0.0–0.2)

## 2019-10-19 LAB — BASIC METABOLIC PANEL
Anion gap: 8 (ref 5–15)
BUN: 19 mg/dL (ref 8–23)
CO2: 29 mmol/L (ref 22–32)
Calcium: 9 mg/dL (ref 8.9–10.3)
Chloride: 103 mmol/L (ref 98–111)
Creatinine, Ser: 0.58 mg/dL (ref 0.44–1.00)
GFR calc Af Amer: >60 mL/min (ref >60)
GFR calc non Af Amer: >60 mL/min (ref >60)
Glucose, Bld: 191 mg/dL — ABNORMAL HIGH (ref 70–99)
Potassium: 4.1 mmol/L (ref 3.5–5.1)
Sodium: 140 mmol/L (ref 135–145)

## 2019-10-19 MED ORDER — CEFDINIR 300 MG PO CAPS
600.0000 mg | ORAL_CAPSULE | Freq: Every day | ORAL | Status: DC
  Administered 2019-10-19 – 2019-10-20 (×2): 600 mg via ORAL
  Filled 2019-10-19 (×3): qty 2

## 2019-10-19 MED ORDER — ENSURE ENLIVE PO LIQD
237.0000 mL | Freq: Two times a day (BID) | ORAL | Status: DC
  Administered 2019-10-19 – 2019-10-21 (×3): 237 mL via ORAL

## 2019-10-19 MED ORDER — AZITHROMYCIN 250 MG PO TABS
250.0000 mg | ORAL_TABLET | Freq: Every day | ORAL | Status: DC
  Administered 2019-10-19 – 2019-10-20 (×2): 250 mg via ORAL
  Filled 2019-10-19 (×2): qty 1

## 2019-10-19 MED ORDER — IPRATROPIUM-ALBUTEROL 0.5-2.5 (3) MG/3ML IN SOLN
3.0000 mL | Freq: Two times a day (BID) | RESPIRATORY_TRACT | Status: DC
  Administered 2019-10-20 – 2019-10-21 (×3): 3 mL via RESPIRATORY_TRACT
  Filled 2019-10-19 (×3): qty 3

## 2019-10-19 NOTE — Progress Notes (Signed)
Initial Nutrition Assessment  DOCUMENTATION CODES:   Underweight, suspect malnutrition but unable to confirm at this time  INTERVENTION:   - Liberalize diet to Regular, verbal with readback placed per MD  - Ensure Enlive po BID, each supplement provides 350 kcal and 20 grams of protein  - Continue Oscal with D  NUTRITION DIAGNOSIS:   Increased nutrient needs related to chronic illness (COPD) as evidenced by estimated needs.  GOAL:   Patient will meet greater than or equal to 90% of their needs  MONITOR:   PO intake, Supplement acceptance, Labs, Weight trends  REASON FOR ASSESSMENT:   Consult Assessment of nutrition requirement/status  ASSESSMENT:   86 year old female who presented on 7/19 with SOB and fever. PMH of COPD on 3 L oxygen at baseline, atrial fibrillation, HTN, GERD. Admitted with COPD exacerbation.   RD was unable to reach pt via phone call to room.  Pt with a history of severe protein calorie malnutrition based on RD physical exam. Suspect malnutrition persists but this RD unable to confirm without repeat NFPE.  No meal completions documented this admission.  Discussed diet liberalizing with MD who agreed. Verbal with readback order placed.  Will order Ensure Enlive BID to aid pt in meeting kcal and protein needs.  Reviewed weight history in chart. Weight stable between 44-47 kg over the last year. No real trends.  Medications reviewed and include: Oscal with D, solu-medrol, miralax, Klor-con 20 mEq daily, IV abx  Labs reviewed.  NUTRITION - FOCUSED PHYSICAL EXAM:  Unable to complete at this time. RD working remotely.  Diet Order:   Diet Order            Diet regular Room service appropriate? Yes; Fluid consistency: Thin  Diet effective now                 EDUCATION NEEDS:   Not appropriate for education at this time  Skin:  Skin Assessment: Reviewed RN Assessment  Last BM:  10/19/19  Height:   Ht Readings from Last 1 Encounters:   10/17/19 5\' 3"  (1.6 m)    Weight:   Wt Readings from Last 1 Encounters:  10/17/19 46.2 kg    Ideal Body Weight:  52.3 kg  BMI:  Body mass index is 18.05 kg/m.  Estimated Nutritional Needs:   Kcal:  1350-1550  Protein:  60-75 grams  Fluid:  1.3-1.5 L    Gaynell Face, MS, RD, LDN Inpatient Clinical Dietitian Please see AMiON for contact information.

## 2019-10-19 NOTE — Evaluation (Signed)
Occupational Therapy Evaluation Patient Details Name: Heather [name] MRN: [id number] DOB: 23-Jan-1927 Today's Date: 10/19/2019    History of Present Illness Heather [name] is a 86 y.o. female with medical history significant for Hx of COPD with chronic hypoxemia on 3L, paroxysmal atrial fibrillation on Eliquis, hypertension, and hypothyroidism who presents with worsening shortness of breath.    Clinical Impression   PTA, pt lives with daughter and son-in-law. Pt's family able to provide 24/7 supervision (daughter works from home, son assists if daughter cannot). Pt reports Modified Independence with ADLs, housekeeping tasks, and mobility with Rollator. Pt presents now with diagnoses above and deficits in cardiopulmonary tolerance, strength, and dynamic standing balance. Pt received on 2 L O2 and experienced dyspnea throughout session. Delayed SpO2 reading after activity at 92%, but suspect upper 80s immediately after activity. Pt requires min guard for short distance mobility and LB ADLs at this time. Plan to further educate on implementation of energy conservation strategies and build activity tolerance to maximize ADL/IADL performance.     Follow Up Recommendations  Home health OT;Supervision/Assistance - 24 hour    Equipment Recommendations  3 in 1 bedside commode (for shower use)    Recommendations for Other Services       Precautions / Restrictions Precautions Precautions: Fall Precaution Comments: monitor O2 Restrictions Weight Bearing Restrictions: No      Mobility Bed Mobility               General bed mobility comments: up in recliner on entry  Transfers Overall transfer level: Needs assistance Equipment used: Rolling walker (2 wheeled) Transfers: Sit to/from [facility] Sit to Stand: Min assist Stand pivot transfers: Min guard       General transfer comment: min guard with unsteadiness noted with initial sit to stand, but pt improved once  mobilizing.    Balance Overall balance assessment: Needs assistance Sitting-balance support: Feet supported Sitting balance-Leahy Scale: Fair Sitting balance - Comments: leaning to don socks without LOB   Standing balance support: Single extremity supported;During functional activity Standing balance-Leahy Scale: Poor Standing balance comment: requires B UE support to maintain standing balance                           ADL either performed or assessed with clinical judgement   ADL Overall ADL's : Needs assistance/impaired Eating/Feeding: Set up;Sitting   Grooming: Supervision/safety;Standing;Wash/dry hands Grooming Details (indicate cue type and reason): Supervision for standing at sink for hand hygiene. Pt noted to push RW out of way to step up to sink Upper Body Bathing: Supervision/ safety;Sitting   Lower Body Bathing: Min guard;Sit to/from stand   Upper Body Dressing : Supervision/safety;Sitting   Lower Body Dressing: Min guard;Sit to/from stand Lower Body Dressing Details (indicate cue type and reason): min guard in standing to ensure safety as unsupported balance activities challenging. Pt able to don socks sitting in recliner chair supervision level with increased effort noted Toilet Transfer: Min guard;Ambulation;Comfort height toilet;RW Armed forces technical officer Details (indicate cue type and reason): min guard for ambulation with RW, no major LOB, but generalized unsteadiness noted Toileting- Water quality scientist and Hygiene: Min guard;Sit to/from stand       Functional mobility during ADLs: Min guard;Rolling walker General ADL Comments: Pt limited by decreased strength and decreased cardiopulmonary tolerance with SOB and frequent rest breaks needed     Vision Baseline Vision/History: Wears glasses Wears Glasses: At all times Patient Visual Report: No change from baseline  Vision Assessment?: No apparent visual deficits     Perception     Praxis       Pertinent Vitals/Pain Pain Assessment: No/denies pain     Hand Dominance Right   Extremity/Trunk Assessment Upper Extremity Assessment Upper Extremity Assessment: Generalized weakness   Lower Extremity Assessment Lower Extremity Assessment: Defer to PT evaluation   Cervical / Trunk Assessment Cervical / Trunk Assessment: Normal   Communication Communication Communication: No difficulties   Cognition Arousal/Alertness: Awake/alert Behavior During Therapy: WFL for tasks assessed/performed Overall Cognitive Status: Within Functional Limits for tasks assessed                                     General Comments  Pt on 2 L O2 on OT entry. Pt reports wearing O2 prn at home. Pt with frequent productive coughs and SOB during session. Delayed reading after short distance mobility at 92% on 2 LO2. Suspect upper 80s reading right after mobility based on pt's 3/4 DOE    Exercises     Shoulder Instructions      Home Living Family/patient expects to be discharged to:: Private residence Living Arrangements: Children Available Help at Discharge: Family;Available 24 hours/day Type of Home: House Home Access: Ramped entrance     Home Layout: One level     Bathroom Shower/Tub: Teacher, early years/pre: Handicapped height Bathroom Accessibility: Yes   Home Equipment: Grab bars - toilet;Grab bars - tub/shower;Walker - 2 wheels;Cane - single point;Walker - 4 wheels          Prior Functioning/Environment Level of Independence: Independent with assistive device(s)        Comments: Pt reports she uses Rollator for mobility in the home. Pt was completing ADLs Modified Independent, was assisting with laundry and cleaning in the home. Reports wearing O2 when needed at home        OT Problem List: Decreased strength;Decreased activity tolerance;Impaired balance (sitting and/or standing);Cardiopulmonary status limiting activity      OT  Treatment/Interventions: Self-care/ADL training;Therapeutic exercise;Energy conservation;DME and/or AE instruction;Therapeutic activities;Patient/family education    OT Goals(Current goals can be found in the care plan section) Acute Rehab OT Goals Patient Stated Goal: go home and be active like I was OT Goal Formulation: With patient Time For Goal Achievement: 11/02/19 Potential to Achieve Goals: Good ADL Goals Pt Will Perform Grooming: with modified independence;standing Pt Will Perform Lower Body Dressing: with modified independence;sit to/from stand Pt Will Transfer to Toilet: with modified independence;ambulating;bedside commode Pt Will Perform Toileting - Clothing Manipulation and hygiene: with modified independence;sit to/from stand Additional ADL Goal #1: Pt to verbalize 3 energy conservation strategies to implement during ADLs/IADLs Additional ADL Goal #2: Pt to demonstrate ability to complete simple IADL task at Supervision level with implementation of energy conservation strategies  OT Frequency: Min 2X/week   Barriers to D/C:            Co-evaluation              AM-PAC OT "6 Clicks" Daily Activity     Outcome Measure Help from another person eating meals?: A Little Help from another person taking care of personal grooming?: A Little Help from another person toileting, which includes using toliet, bedpan, or urinal?: A Little Help from another person bathing (including washing, rinsing, drying)?: A Little Help from another person to put on and taking off regular upper body clothing?: A Little Help  from another person to put on and taking off regular lower body clothing?: A Little 6 Click Score: 18   End of Session Equipment Utilized During Treatment: Gait belt;Rolling walker;Oxygen Nurse Communication: Mobility status  Activity Tolerance: Patient tolerated treatment well Patient left: in chair;with call bell/phone within reach;with chair alarm set  OT Visit  Diagnosis: Unsteadiness on feet (R26.81);Other abnormalities of gait and mobility (R26.89);Muscle weakness (generalized) (M62.81);Other (comment) (decreased cardiopulmonary tolerance)                Time: 5051-8335 OT Time Calculation (min): 37 min Charges:  OT General Charges $OT Visit: 1 Visit OT Evaluation $OT Eval Moderate Complexity: 1 Mod OT Treatments $Self Care/Home Management : 8-22 mins  Layla Maw, OTR/L  Layla Maw 10/19/2019, 9:25 AM

## 2019-10-19 NOTE — Progress Notes (Signed)
PROGRESS NOTE    Heather [name]  OEV:[id number] DOB: 1926/10/28 DOA: 10/17/2019 PCP: Burnard Bunting, MD   Brief Narrative:  87 year old female with a history of chronic respiratory failure on 3 L, severe COPD, paroxysmal atrial fibrillation on apixaban, hypothyroidism, hypertension, peripheral neuropathy presenting with 4-day history of shortness of breath and decreased oral intake.  She is complaining of increasing cough with yellow sputum.  She denies any mopped assist.  The patient has had some subjective chills.  She states that she was exposed to her son-in-law who had a similar illness 2 days prior to the onset of her own symptoms.  She increased her home oxygen demand from 3 to 5 L at home.  She continued to have shortness of breath.  To result, she presented for further evaluation.  Chest x-ray in the emergency department did not show any consolidations.  It showed a chronically elevated right hemidiaphragm with hyperinflation.  BMP and LFTs were unremarkable.  WBC 10.3, hemoglobin 14.4, platelets 230,000.  The patient was started on bronchodilators and IV Solu-Medrol for COPD exacerbation.  Assessment & Plan:   Principal Problem:   COPD exacerbation ([place]) Active Problems:   PAF (paroxysmal atrial fibrillation) (HCC)   HTN (hypertension)   Community acquired pneumonia   Sepsis ([place])   Hypothyroidism   Acute respiratory failure with hypoxia (HCC)   Acute on chronic respiratory failure with hypoxia (HCC)  Acute on chronic respiratory failure with hypoxia-improving -Secondary COPD exacerbation -Initially on 5 L -Wean back to 3 L which is her usual baseline demand at home, currently on 2L, but difficult for her to tolerate; will increase back to 3L  COPD exacerbation-ongoing -Triggered by coronavirus infection (NOT SARS-CoV2) -Viral respiratory panel positive for coronavirus OC 43 -Increase IV Medrol to 40 mg IV twice daily -Pulmicort bid -Continue duo nebs q6 hours -Add  Brovana  SIRS -Sepsis ruled out -Lactic acid 1.6 -Personally reviewed chest x-ray--no consolidation -Initially presented with tachycardia and fever up to 1 to 101.4 F  Paroxysmal atrial fibrillation -Currently in sinus rhythm -Continue Cardizem CD 180 mg daily -Continue apixaban  Right middle lobe lung nodule -Noted on CT 10/13/2019 -This was a surveillance CT ordered by the patient's pulmonologist--Dr. Byrum -Nodule measured 2.0 x 1.3 cm (previously 1.8 x 1.2 cm) -Outpatient follow-up  Hypothyroidism -Continue levothyroxine  Essential hypertension -Continue Cardizem CD  Hypokalemia-repleted  Chest pain -atypical by hx -personally reviewed EKG--sinus, nonspecific ST changes -troponin 8>>9    DVT prophylaxis: Eliquis Code Status: DNR Family Communication: Discussed with daughter Ms. Curt Bears; prefers her landline [phone] Disposition Plan:   Status is: Inpatient  Remains inpatient appropriate because:IV treatments appropriate due to intensity of illness or inability to take PO and Inpatient level of care appropriate due to severity of illness   Dispo: The patient is from: Home              Anticipated d/c is to: Home              Anticipated d/c date is: 2 days              Patient currently is not medically stable to d/c.  Consultants:   None  Procedures:   See below  Antimicrobials:  Anti-infectives (From admission, onward)   Start     Dose/Rate Route Frequency Ordered Stop   10/18/19 1900  cefTRIAXone (ROCEPHIN) 1 g in sodium chloride 0.9 % 100 mL IVPB     Discontinue     1 g 200  mL/hr over 30 Minutes Intravenous Every 24 hours 10/17/19 1844 10/23/19 1859   10/18/19 1800  azithromycin (ZITHROMAX) 500 mg in sodium chloride 0.9 % 250 mL IVPB     Discontinue     500 mg 250 mL/hr over 60 Minutes Intravenous Every 24 hours 10/17/19 1844     10/17/19 1730  cefTRIAXone (ROCEPHIN) 1 g in sodium chloride 0.9 % 100 mL IVPB        1 g 200 mL/hr over  30 Minutes Intravenous  Once 10/17/19 1719 10/17/19 2019   10/17/19 1730  azithromycin (ZITHROMAX) 500 mg in sodium chloride 0.9 % 250 mL IVPB        500 mg 250 mL/hr over 60 Minutes Intravenous  Once 10/17/19 1719 10/17/19 1856       Subjective: Patient seen and evaluated today with ongoing shortness of breath as well as cough with sputum production and wheezing.  She is particularly worse with any exertion.  Objective: Vitals:   10/18/19 2057 10/19/19 0101 10/19/19 0807 10/19/19 0825  BP: (!) 104/56   124/85  Pulse: 92   92  Resp:    20  Temp: 98.1 F (36.7 C)   97.7 F (36.5 C)  TempSrc: Oral   Oral  SpO2: 93% 97% 96% 96%  Weight:      Height:        Intake/Output Summary (Last 24 hours) at 10/19/2019 1000 Last data filed at 10/18/2019 1848 Gross per 24 hour  Intake 1250 ml  Output --  Net 1250 ml   Filed Weights   10/17/19 1628  Weight: 46.2 kg    Examination:  General exam: Appears calm and comfortable  Respiratory system: Diminished breath sounds bilaterally.  Currently on 2 L nasal cannula oxygen. Cardiovascular system: S1 & S2 heard, RRR. No JVD, murmurs, rubs, gallops or clicks. No pedal edema. Gastrointestinal system: Abdomen is nondistended, soft and nontender. No organomegaly or masses felt. Normal bowel sounds heard. Central nervous system: Alert and oriented. No focal neurological deficits. Extremities: Symmetric 5 x 5 power. Skin: No rashes, lesions or ulcers Psychiatry: Judgement and insight appear normal. Mood & affect appropriate.     Data Reviewed: I have personally reviewed following labs and imaging studies  CBC: Recent Labs  Lab 10/17/19 1700 10/18/19 0751 10/19/19 0251  WBC 10.3 14.1* 13.8*  NEUTROABS 8.5*  --   --   HGB 14.4 13.0 13.4  HCT 43.7 39.9 40.7  MCV 94.8 95.9 95.3  PLT 230 208 672   Basic Metabolic Panel: Recent Labs  Lab 10/17/19 1700 10/18/19 0751 10/19/19 0251  NA 137 138 140  K 3.4* 4.1 4.1  CL 93* 100 103   CO2 33* 31 29  GLUCOSE 153* 189* 191*  BUN 18 18 19   CREATININE 0.78 0.69 0.58  CALCIUM 9.4 8.6* 9.0  MG  --  2.3 2.2   GFR: Estimated Creatinine Clearance: 34.1 mL/min (by C-G formula based on SCr of 0.58 mg/dL). Liver Function Tests: Recent Labs  Lab 10/17/19 1700  AST 27  ALT 25  ALKPHOS 82  BILITOT 1.3*  PROT 6.5  ALBUMIN 3.9   Recent Labs  Lab 10/17/19 1700  LIPASE 32   No results for input(s): AMMONIA in the last 168 hours. Coagulation Profile: Recent Labs  Lab 10/17/19 1700  INR 1.3*   Cardiac Enzymes: No results for input(s): CKTOTAL, CKMB, CKMBINDEX, TROPONINI in the last 168 hours. BNP (last 3 results) No results for input(s): PROBNP in the last 8760  hours. HbA1C: No results for input(s): HGBA1C in the last 72 hours. CBG: No results for input(s): GLUCAP in the last 168 hours. Lipid Profile: No results for input(s): CHOL, HDL, LDLCALC, TRIG, CHOLHDL, LDLDIRECT in the last 72 hours. Thyroid Function Tests: No results for input(s): TSH, T4TOTAL, FREET4, T3FREE, THYROIDAB in the last 72 hours. Anemia Panel: No results for input(s): VITAMINB12, FOLATE, FERRITIN, TIBC, IRON, RETICCTPCT in the last 72 hours. Sepsis Labs: Recent Labs  Lab 10/17/19 1653 10/18/19 0751  PROCALCITON  --  9.37  LATICACIDVEN 1.6 1.6    Recent Results (from the past 240 hour(s))  Culture, blood (routine x 2)     Status: None (Preliminary result)   Collection Time: 10/17/19  4:48 PM   Specimen: BLOOD  Result Value Ref Range Status   Specimen Description BLOOD SITE NOT SPECIFIED  Final   Special Requests   Final    BOTTLES DRAWN AEROBIC AND ANAEROBIC Blood Culture adequate volume   Culture   Final    NO GROWTH 2 DAYS Performed at [facility] [address] [place], [place] [address]    Report Status PENDING  Incomplete  Culture, blood (routine x 2)     Status: None (Preliminary result)   Collection Time: 10/17/19  5:00 PM   Specimen: BLOOD RIGHT ARM  Result  Value Ref Range Status   Specimen Description BLOOD RIGHT ARM  Final   Special Requests   Final    BOTTLES DRAWN AEROBIC ONLY Blood Culture results may not be optimal due to an inadequate volume of blood received in culture bottles   Culture   Final    NO GROWTH 2 DAYS Performed at [facility], [place] [address], [place], [place] [address]    Report Status PENDING  Incomplete  Urine culture     Status: Abnormal   Collection Time: 10/17/19  6:06 PM   Specimen: Urine, Clean Catch  Result Value Ref Range Status   Specimen Description URINE, CLEAN CATCH  Final   Special Requests   Final    NONE Performed at [facility], [place] [address] [place], [place] [address]    Culture MULTIPLE SPECIES PRESENT, SUGGEST RECOLLECTION (A)  Final   Report Status 10/18/2019 FINAL  Final  SARS Coronavirus 2 by RT PCR (hospital order, performed in [facility] hospital lab) Nasopharyngeal Nasopharyngeal Swab     Status: None   Collection Time: 10/17/19  7:19 PM   Specimen: Nasopharyngeal Swab  Result Value Ref Range Status   SARS Coronavirus 2 NEGATIVE NEGATIVE Final    Comment: (NOTE) SARS-CoV-2 target nucleic acids are NOT DETECTED.  The SARS-CoV-2 RNA is generally detectable in upper and lower respiratory specimens during the acute phase of infection. The lowest concentration of SARS-CoV-2 viral copies this assay can detect is 250 copies / mL. A negative result does not preclude SARS-CoV-2 infection and should not be used as the sole basis for treatment or other patient management decisions.  A negative result may occur with improper specimen collection / handling, submission of specimen other than nasopharyngeal swab, presence of viral mutation(s) within the areas targeted by this assay, and inadequate number of viral copies (<250 copies / mL). A negative result must be combined with clinical observations, patient history, and epidemiological information.  Fact Sheet for Patients:    StrictlyIdeas.no  Fact Sheet for Healthcare Providers: BankingDealers.co.za  This test is not yet approved or  cleared by the [place] FDA and has  been authorized for detection and/or diagnosis of SARS-CoV-2 by FDA under an Emergency Use Authorization (EUA).  This EUA will remain in effect (meaning this test can be used) for the duration of the COVID-19 declaration under Section 564(b)(1) of the Act, 21 U.S.C. section 360bbb-3(b)(1), unless the authorization is terminated or revoked sooner.  Performed at [facility], [place] [address] [place], [place] [address]   Respiratory Panel by PCR     Status: Abnormal   Collection Time: 10/17/19  7:19 PM   Specimen: Nasopharyngeal Swab; Respiratory  Result Value Ref Range Status   Adenovirus NOT DETECTED NOT DETECTED Final   Coronavirus 229E NOT DETECTED NOT DETECTED Final    Comment: (NOTE) The Coronavirus on the Respiratory Panel, DOES NOT test for the novel  Coronavirus (2019 nCoV)    Coronavirus HKU1 NOT DETECTED NOT DETECTED Final   Coronavirus NL63 NOT DETECTED NOT DETECTED Final   Coronavirus OC43 DETECTED (A) NOT DETECTED Final   Metapneumovirus NOT DETECTED NOT DETECTED Final   Rhinovirus / Enterovirus NOT DETECTED NOT DETECTED Final   Influenza A NOT DETECTED NOT DETECTED Final   Influenza B NOT DETECTED NOT DETECTED Final   Parainfluenza Virus 1 NOT DETECTED NOT DETECTED Final   Parainfluenza Virus 2 NOT DETECTED NOT DETECTED Final   Parainfluenza Virus 3 NOT DETECTED NOT DETECTED Final   Parainfluenza Virus 4 NOT DETECTED NOT DETECTED Final   Respiratory Syncytial Virus NOT DETECTED NOT DETECTED Final   Bordetella pertussis NOT DETECTED NOT DETECTED Final   Chlamydophila pneumoniae NOT DETECTED NOT DETECTED Final   Mycoplasma pneumoniae NOT DETECTED NOT DETECTED Final    Comment: Performed at [facility], [place]. [address], [place], [place] [address]          Radiology Studies: DG Chest Port 1 View  Result Date: 10/17/2019 CLINICAL DATA:  SOB. COPD. EXAM: PORTABLE CHEST 1 VIEW COMPARISON:  Chest radiograph 01/06/2019, CT chest 10/13/2019 FINDINGS: Stable cardiomediastinal contours. Persistent asymmetric elevation of the right hemidiaphragm. The lungs are hyperexpanded. Emphysema. No new focal opacity. The patient's known right middle lobe nodule is not well visualized radiographically. No pneumothorax or significant pleural effusion. No acute finding in the visualized skeleton. IMPRESSION: 1. No acute cardiopulmonary finding. 2. COPD and emphysema. 3. The patient's known right middle lobe pulmonary nodule is not well seen radiographically. Electronically Signed   By: Audie Pinto M.D.   On: 10/17/2019 17:17        Scheduled Meds: . apixaban  2.5 mg Oral BID PC  . arformoterol  15 mcg Nebulization BID  . budesonide (PULMICORT) nebulizer solution  0.25 mg Nebulization BID  . calcium-vitamin D  1 tablet Oral Daily  . diltiazem  180 mg Oral q morning - 10a  . ipratropium-albuterol  3 mL Nebulization Q6H  . levothyroxine  137 mcg Oral QAC breakfast  . methylPREDNISolone (SOLU-MEDROL) injection  40 mg Intravenous Q12H  . polyethylene glycol  17 g Oral QODAY  . potassium chloride SA  20 mEq Oral q morning - 10a   Continuous Infusions: . azithromycin 500 mg (10/18/19 1618)  . cefTRIAXone (ROCEPHIN)  IV 1 g (10/18/19 1848)     LOS: 2 days    Time spent: 35 minutes    Blondell Laperle D Manuella Ghazi, DO [facility]  If 7PM-7AM, please contact night-coverage [email or web address] 10/19/2019, 10:00 AM

## 2019-10-19 NOTE — Progress Notes (Signed)
Ok to change her IV abx to PO cefdinir/azith for 3 more days to complete 5d of therapy per Dr. Manuella Ghazi.  Onnie Boer, PharmD, BCIDP, AAHIVP, CPP Infectious Disease Pharmacist 10/19/2019 3:30 PM

## 2019-10-19 NOTE — Evaluation (Signed)
Physical Therapy Evaluation Patient Details Name: Heather [name] MRN: [id number] DOB: 1927-02-20 Today's Date: 10/19/2019   History of Present Illness  Heather [name] is a 86 y.o. female with medical history significant for Hx of COPD with chronic hypoxemia on 3L, paroxysmal atrial fibrillation on Eliquis, hypertension, and hypothyroidism who presents with worsening shortness of breath.   Clinical Impression   Patient received in chair, very pleasant and willing to work with therapy. Had incontinent urination in standing, then able to transfer to [facility] with min guard/RW and also had BM. Sats >92% with transfers with RW on 2LPM. Tolerated gait training approximately 54f with RW/min guard and assist for line management but with signfiicant increase in WOB and accessory muscle activation, unable to get accurate signal from pulse ox at this point but suspect desat into 80s- did recover back to baseline SpO2 in 90s with seated rest however. Left sitting up in chair with all needs met, chair alarm active. Will benefit from HHPT moving forward.     Follow Up Recommendations Home health PT    Equipment Recommendations  None recommended by PT (well equipped)    Recommendations for Other Services       Precautions / Restrictions Precautions Precautions: Fall Precaution Comments: monitor O2 Restrictions Weight Bearing Restrictions: No      Mobility  Bed Mobility               General bed mobility comments: up in recliner on entry  Transfers Overall transfer level: Needs assistance Equipment used: Rolling walker (2 wheeled) Transfers: Sit to/from Stand Sit to Stand: Min guard Stand pivot transfers: Min guard       General transfer comment: min guard with RW, min VC for safety; had incontinent urination in standing then BM on BSC  Ambulation/Gait Ambulation/Gait assistance: Min guard Gait Distance (Feet): 20 Feet Assistive device: Rolling walker (2 wheeled) Gait  Pattern/deviations: Step-through pattern;Trunk flexed Gait velocity: decreased   General Gait Details: slow but steady with RW, increased WOB however pulse ox unable to get accurate signal until return to sitting  Stairs            Wheelchair Mobility    Modified Rankin (Stroke Patients Only)       Balance Overall balance assessment: Needs assistance Sitting-balance support: Feet supported Sitting balance-Leahy Scale: Fair Sitting balance - Comments: leaning to don socks without LOB   Standing balance support: Single extremity supported;During functional activity Standing balance-Leahy Scale: Fair Standing balance comment: requires B UE support to maintain standing balance                             Pertinent Vitals/Pain Pain Assessment: No/denies pain    Home Living Family/patient expects to be discharged to:: Private residence Living Arrangements: Children Available Help at Discharge: Family;Available 24 hours/day Type of Home: House Home Access: Ramped entrance     Home Layout: One level Home Equipment: Grab bars - toilet;Grab bars - tub/shower;Walker - 2 wheels;Cane - single point;Walker - 4 wheels      Prior Function Level of Independence: Independent with assistive device(s)         Comments: Pt reports she uses Rollator for mobility in the home. Pt was completing ADLs Modified Independent, was assisting with laundry and cleaning in the home. Reports wearing O2 when needed at home     Hand Dominance   Dominant Hand: Right    Extremity/Trunk Assessment   Upper  Extremity Assessment Upper Extremity Assessment: Defer to OT evaluation    Lower Extremity Assessment Lower Extremity Assessment: Generalized weakness    Cervical / Trunk Assessment Cervical / Trunk Assessment: Normal  Communication   Communication: No difficulties  Cognition Arousal/Alertness: Awake/alert Behavior During Therapy: WFL for tasks  assessed/performed Overall Cognitive Status: Within Functional Limits for tasks assessed                                        General Comments General comments (skin integrity, edema, etc.): 2LPMO2 91-94% with transfers to/from [facility]; unable to get accurate signal with gait but suspect desat into 80s due to increased WOB and accessory muscle use, increased lung sounds after activity    Exercises     Assessment/Plan    PT Assessment Patient needs continued PT services  PT Problem List Decreased strength;Decreased knowledge of use of DME;Decreased activity tolerance;Decreased balance;Decreased mobility;Decreased coordination;Cardiopulmonary status limiting activity       PT Treatment Interventions DME instruction;Balance training;Gait training;Functional mobility training;Patient/family education;Therapeutic activities;Therapeutic exercise    PT Goals (Current goals can be found in the Care Plan section)  Acute Rehab PT Goals Patient Stated Goal: go home and be active like I was PT Goal Formulation: With patient Time For Goal Achievement: 11/02/19 Potential to Achieve Goals: Good    Frequency Min 3X/week   Barriers to discharge        Co-evaluation               AM-PAC PT "6 Clicks" Mobility  Outcome Measure Help needed turning from your back to your side while in a flat bed without using bedrails?: A Little Help needed moving from lying on your back to sitting on the side of a flat bed without using bedrails?: A Little Help needed moving to and from a bed to a chair (including a wheelchair)?: A Little Help needed standing up from a chair using your arms (e.g., wheelchair or bedside chair)?: A Little Help needed to walk in hospital room?: A Little Help needed climbing 3-5 steps with a railing? : A Little 6 Click Score: 18    End of Session Equipment Utilized During Treatment: Oxygen Activity Tolerance: Patient tolerated treatment well Patient left:  in chair;with call bell/phone within reach;with chair alarm set   PT Visit Diagnosis: Muscle weakness (generalized) (M62.81);Unsteadiness on feet (R26.81)    Time: 0026-2854 PT Time Calculation (min) (ACUTE ONLY): 26 min   Charges:   PT Evaluation $PT Eval Moderate Complexity: 1 Mod PT Treatments $Gait Training: 8-22 mins        Windell Norfolk, DPT, PN1   Supplemental Physical Therapist [place]    Pager [phone] Acute Rehab Office [phone]

## 2019-10-20 LAB — CBC
HCT: 41.7 % (ref 36.0–46.0)
Hemoglobin: 13.9 g/dL (ref 12.0–15.0)
MCH: 31.9 pg (ref 26.0–34.0)
MCHC: 33.3 g/dL (ref 30.0–36.0)
MCV: 95.6 fL (ref 80.0–100.0)
Platelets: 256 10*3/uL (ref 150–400)
RBC: 4.36 MIL/uL (ref 3.87–5.11)
RDW: 13.5 % (ref 11.5–15.5)
WBC: 11.8 10*3/uL — ABNORMAL HIGH (ref 4.0–10.5)
nRBC: 0 % (ref 0.0–0.2)

## 2019-10-20 LAB — BASIC METABOLIC PANEL
Anion gap: 9 (ref 5–15)
BUN: 26 mg/dL — ABNORMAL HIGH (ref 8–23)
CO2: 27 mmol/L (ref 22–32)
Calcium: 9.3 mg/dL (ref 8.9–10.3)
Chloride: 103 mmol/L (ref 98–111)
Creatinine, Ser: 0.74 mg/dL (ref 0.44–1.00)
GFR calc Af Amer: >60 mL/min (ref >60)
GFR calc non Af Amer: >60 mL/min (ref >60)
Glucose, Bld: 220 mg/dL — ABNORMAL HIGH (ref 70–99)
Potassium: 4.6 mmol/L (ref 3.5–5.1)
Sodium: 139 mmol/L (ref 135–145)

## 2019-10-20 MED ORDER — ACETYLCYSTEINE 10 % IN SOLN: 2.0000 mL | Freq: Two times a day (BID) | RESPIRATORY_TRACT | Status: DC

## 2019-10-20 MED ORDER — ACETYLCYSTEINE 10 % IN SOLN
2.0000 mL | Freq: Three times a day (TID) | RESPIRATORY_TRACT | Status: DC
  Filled 2019-10-20 (×2): qty 2

## 2019-10-20 MED ORDER — ACETYLCYSTEINE 20 % IN SOLN
4.0000 mL | Freq: Three times a day (TID) | RESPIRATORY_TRACT | Status: AC
  Administered 2019-10-20 (×2): 4 mL via RESPIRATORY_TRACT
  Filled 2019-10-20 (×3): qty 4

## 2019-10-20 NOTE — Progress Notes (Signed)
PROGRESS NOTE    Heather [name]  TIW:[id number] DOB: Oct 20, 1926 DOA: 10/17/2019 PCP: Burnard Bunting, MD   Brief Narrative:  86 year old female with a history of chronic respiratory failure on 3 L, severe COPD, paroxysmal atrial fibrillation on apixaban, hypothyroidism, hypertension, peripheral neuropathy presenting with 4-day history of shortness of breath and decreased oral intake. She is complaining of increasing cough with yellow sputum. She denies any mopped assist. The patient has had some subjective chills. She states that she was exposed to her son-in-law who had a similar illness 2 days prior to the onset of her own symptoms. She increased her home oxygen demand from 3 to 5 L at home. She continued to have shortness of breath. To result, she presented for further evaluation. Chest x-ray in the emergency department did not show any consolidations. It showed a chronically elevated right hemidiaphragm with hyperinflation. BMP and LFTs were unremarkable. WBC 10.3, hemoglobin 14.4, platelets 230,000. The patient was started on bronchodilators and IV Solu-Medrol for COPD exacerbation.  -She continues to have very slow progress.  Assessment & Plan:   Principal Problem:   COPD exacerbation ([place]) Active Problems:   PAF (paroxysmal atrial fibrillation) (HCC)   HTN (hypertension)   Community acquired pneumonia   Sepsis ([place])   Hypothyroidism   Acute respiratory failure with hypoxia (HCC)   Acute on chronic respiratory failure with hypoxia (HCC)   Acute on chronic respiratory failure with hypoxia-improving -Secondary COPD exacerbation -Initially on 5 L -Wean back to 3 L which is her usual baseline demand at home, currently on 2L and she appears to be tolerating  COPD exacerbation-ongoing -Triggered by coronavirus infection(NOT SARS-CoV2) -Viral respiratory panel positive for coronavirus OC 43 -Increase IV Medrol to 40 mg IV twice daily -Pulmicort bid -Continue duo  nebs q6 hours -Add Brovana -Add Mucomyst as well as chest PT and flutter valve today to help with chest congestion 7/22 -Chest PT  SIRS -Sepsis ruled out -Lactic acid 1.6 -Personally reviewed chest x-ray--no consolidation -Initially presented with tachycardia and fever up to 1 to 101.4 F  Paroxysmal atrial fibrillation -Currently in sinus rhythm -Continue Cardizem CD 180 mg daily -Continue apixaban  Right middle lobe lung nodule -Noted on CT 10/13/2019 -This was a surveillance CT ordered by the patient'spulmonologist--Dr. Lamonte Sakai -Nodule measured 2.0 x 1.3 cm(previously 1.8 x 1.2 cm) -Outpatient follow-up  Hypothyroidism -Continue levothyroxine  Essential hypertension -Continue Cardizem CD  Hypokalemia-repleted  Chest pain -atypical by hx -personally reviewed EKG--sinus, nonspecific ST changes -troponin 8>>9    DVT prophylaxis: Eliquis Code Status: DNR Family Communication: Discussed with daughter Ms. Curt Bears; prefers her landline [phone] Disposition Plan:   Status is: Inpatient  Remains inpatient appropriate because:IV treatments appropriate due to intensity of illness or inability to take PO and Inpatient level of care appropriate due to severity of illness   Dispo: The patient is from: Home  Anticipated d/c is to: Home  Anticipated d/c date is: 1-2 days  Patient currently is not medically stable to d/c.  Consultants:   None  Procedures:   See below  Antimicrobials:  Anti-infectives (From admission, onward)   Start     Dose/Rate Route Frequency Ordered Stop   10/19/19 1800  cefdinir (OMNICEF) capsule 600 mg     Discontinue     600 mg Oral Daily-1800 10/19/19 1530 10/22/19 1759   10/19/19 1800  azithromycin (ZITHROMAX) tablet 250 mg     Discontinue     250 mg Oral Daily-1800 10/19/19 1530 10/22/19 1759   10/18/19  1900  cefTRIAXone (ROCEPHIN) 1 g in sodium chloride 0.9 % 100 mL IVPB  Status:   Discontinued        1 g 200 mL/hr over 30 Minutes Intravenous Every 24 hours 10/17/19 1844 10/19/19 1530   10/18/19 1800  azithromycin (ZITHROMAX) 500 mg in sodium chloride 0.9 % 250 mL IVPB  Status:  Discontinued        500 mg 250 mL/hr over 60 Minutes Intravenous Every 24 hours 10/17/19 1844 10/19/19 1530   10/17/19 1730  cefTRIAXone (ROCEPHIN) 1 g in sodium chloride 0.9 % 100 mL IVPB        1 g 200 mL/hr over 30 Minutes Intravenous  Once 10/17/19 1719 10/17/19 2019   10/17/19 1730  azithromycin (ZITHROMAX) 500 mg in sodium chloride 0.9 % 250 mL IVPB        500 mg 250 mL/hr over 60 Minutes Intravenous  Once 10/17/19 1719 10/17/19 1856       Subjective: Patient seen and evaluated today with no new acute complaints or concerns. No acute concerns or events noted overnight.  She appears to be progressing, but very slowly.  She continues to have an ongoing productive cough and chest congestion.  Objective: Vitals:   10/20/19 0751 10/20/19 0752 10/20/19 0753 10/20/19 0825  BP:    133/68  Pulse:    86  Resp:      Temp:    98.1 F (36.7 C)  TempSrc:    Oral  SpO2: 97% 97% 97% 97%  Weight:      Height:       No intake or output data in the 24 hours ending 10/20/19 0956 Filed Weights   10/17/19 1628  Weight: 46.2 kg    Examination:  General exam: Appears calm and comfortable  Respiratory system: Clear to auscultation. Respiratory effort normal.  Currently on 2 L nasal cannula oxygen.  No wheezing noted. Cardiovascular system: S1 & S2 heard, RRR. No JVD, murmurs, rubs, gallops or clicks. No pedal edema. Gastrointestinal system: Abdomen is nondistended, soft and nontender. No organomegaly or masses felt. Normal bowel sounds heard. Central nervous system: Alert and oriented. No focal neurological deficits. Extremities: Symmetric 5 x 5 power. Skin: No rashes, lesions or ulcers Psychiatry: Judgement and insight appear normal. Mood & affect appropriate.     Data Reviewed: I have  personally reviewed following labs and imaging studies  CBC: Recent Labs  Lab 10/17/19 1700 10/18/19 0751 10/19/19 0251 10/20/19 0220  WBC 10.3 14.1* 13.8* 11.8*  NEUTROABS 8.5*  --   --   --   HGB 14.4 13.0 13.4 13.9  HCT 43.7 39.9 40.7 41.7  MCV 94.8 95.9 95.3 95.6  PLT 230 208 215 858   Basic Metabolic Panel: Recent Labs  Lab 10/17/19 1700 10/18/19 0751 10/19/19 0251 10/20/19 0220  NA 137 138 140 139  K 3.4* 4.1 4.1 4.6  CL 93* 100 103 103  CO2 33* 31 29 27   GLUCOSE 153* 189* 191* 220*  BUN 18 18 19  26*  CREATININE 0.78 0.69 0.58 0.74  CALCIUM 9.4 8.6* 9.0 9.3  MG  --  2.3 2.2  --    GFR: Estimated Creatinine Clearance: 34.1 mL/min (by C-G formula based on SCr of 0.74 mg/dL). Liver Function Tests: Recent Labs  Lab 10/17/19 1700  AST 27  ALT 25  ALKPHOS 82  BILITOT 1.3*  PROT 6.5  ALBUMIN 3.9   Recent Labs  Lab 10/17/19 1700  LIPASE 32   No results for  input(s): AMMONIA in the last 168 hours. Coagulation Profile: Recent Labs  Lab 10/17/19 1700  INR 1.3*   Cardiac Enzymes: No results for input(s): CKTOTAL, CKMB, CKMBINDEX, TROPONINI in the last 168 hours. BNP (last 3 results) No results for input(s): PROBNP in the last 8760 hours. HbA1C: No results for input(s): HGBA1C in the last 72 hours. CBG: No results for input(s): GLUCAP in the last 168 hours. Lipid Profile: No results for input(s): CHOL, HDL, LDLCALC, TRIG, CHOLHDL, LDLDIRECT in the last 72 hours. Thyroid Function Tests: No results for input(s): TSH, T4TOTAL, FREET4, T3FREE, THYROIDAB in the last 72 hours. Anemia Panel: No results for input(s): VITAMINB12, FOLATE, FERRITIN, TIBC, IRON, RETICCTPCT in the last 72 hours. Sepsis Labs: Recent Labs  Lab 10/17/19 1653 10/18/19 0751  PROCALCITON  --  9.37  LATICACIDVEN 1.6 1.6    Recent Results (from the past 240 hour(s))  Culture, blood (routine x 2)     Status: None (Preliminary result)   Collection Time: 10/17/19  4:48 PM    Specimen: BLOOD  Result Value Ref Range Status   Specimen Description BLOOD SITE NOT SPECIFIED  Final   Special Requests   Final    BOTTLES DRAWN AEROBIC AND ANAEROBIC Blood Culture adequate volume   Culture   Final    NO GROWTH 2 DAYS Performed at [facility] [address], [place], [place] [address]    Report Status PENDING  Incomplete  Culture, blood (routine x 2)     Status: None (Preliminary result)   Collection Time: 10/17/19  5:00 PM   Specimen: BLOOD RIGHT ARM  Result Value Ref Range Status   Specimen Description BLOOD RIGHT ARM  Final   Special Requests   Final    BOTTLES DRAWN AEROBIC ONLY Blood Culture results may not be optimal due to an inadequate volume of blood received in culture bottles   Culture   Final    NO GROWTH 2 DAYS Performed at [facility], [place] [address], [place], [place] [address]    Report Status PENDING  Incomplete  Urine culture     Status: Abnormal   Collection Time: 10/17/19  6:06 PM   Specimen: Urine, Clean Catch  Result Value Ref Range Status   Specimen Description URINE, CLEAN CATCH  Final   Special Requests   Final    NONE Performed at [facility], [place] [address], [place], [place] [address]    Culture MULTIPLE SPECIES PRESENT, SUGGEST RECOLLECTION (A)  Final   Report Status 10/18/2019 FINAL  Final  SARS Coronavirus 2 by RT PCR (hospital order, performed in [facility] hospital lab) Nasopharyngeal Nasopharyngeal Swab     Status: None   Collection Time: 10/17/19  7:19 PM   Specimen: Nasopharyngeal Swab  Result Value Ref Range Status   SARS Coronavirus 2 NEGATIVE NEGATIVE Final    Comment: (NOTE) SARS-CoV-2 target nucleic acids are NOT DETECTED.  The SARS-CoV-2 RNA is generally detectable in upper and lower respiratory specimens during the acute phase of infection. The lowest concentration of SARS-CoV-2 viral copies this assay can detect is 250 copies / mL. A negative result does not preclude SARS-CoV-2  infection and should not be used as the sole basis for treatment or other patient management decisions.  A negative result may occur with improper specimen collection / handling, submission of specimen other than nasopharyngeal swab, presence of viral mutation(s) within the areas targeted by this assay, and inadequate number of viral copies (<250 copies /  mL). A negative result must be combined with clinical observations, patient history, and epidemiological information.  Fact Sheet for Patients:   StrictlyIdeas.no  Fact Sheet for Healthcare Providers: BankingDealers.co.za  This test is not yet approved or  cleared by the [place] FDA and has been authorized for detection and/or diagnosis of SARS-CoV-2 by FDA under an Emergency Use Authorization (EUA).  This EUA will remain in effect (meaning this test can be used) for the duration of the COVID-19 declaration under Section 564(b)(1) of the Act, 21 U.S.C. section 360bbb-3(b)(1), unless the authorization is terminated or revoked sooner.  Performed at [facility], [place] [address], [place], [place] [address]   Respiratory Panel by PCR     Status: Abnormal   Collection Time: 10/17/19  7:19 PM   Specimen: Nasopharyngeal Swab; Respiratory  Result Value Ref Range Status   Adenovirus NOT DETECTED NOT DETECTED Final   Coronavirus 229E NOT DETECTED NOT DETECTED Final    Comment: (NOTE) The Coronavirus on the Respiratory Panel, DOES NOT test for the novel  Coronavirus (2019 nCoV)    Coronavirus HKU1 NOT DETECTED NOT DETECTED Final   Coronavirus NL63 NOT DETECTED NOT DETECTED Final   Coronavirus OC43 DETECTED (A) NOT DETECTED Final   Metapneumovirus NOT DETECTED NOT DETECTED Final   Rhinovirus / Enterovirus NOT DETECTED NOT DETECTED Final   Influenza A NOT DETECTED NOT DETECTED Final   Influenza B NOT DETECTED NOT DETECTED Final   Parainfluenza Virus 1 NOT DETECTED NOT DETECTED  Final   Parainfluenza Virus 2 NOT DETECTED NOT DETECTED Final   Parainfluenza Virus 3 NOT DETECTED NOT DETECTED Final   Parainfluenza Virus 4 NOT DETECTED NOT DETECTED Final   Respiratory Syncytial Virus NOT DETECTED NOT DETECTED Final   Bordetella pertussis NOT DETECTED NOT DETECTED Final   Chlamydophila pneumoniae NOT DETECTED NOT DETECTED Final   Mycoplasma pneumoniae NOT DETECTED NOT DETECTED Final    Comment: Performed at [facility], [place]. [address], [place], [place] [address]         Radiology Studies: No results found.      Scheduled Meds: . acetylcysteine  4 mL Nebulization TID  . apixaban  2.5 mg Oral BID PC  . arformoterol  15 mcg Nebulization BID  . azithromycin  250 mg Oral q1800  . budesonide (PULMICORT) nebulizer solution  0.25 mg Nebulization BID  . calcium-vitamin D  1 tablet Oral Daily  . cefdinir  600 mg Oral q1800  . diltiazem  180 mg Oral q morning - 10a  . feeding supplement (ENSURE ENLIVE)  237 mL Oral BID BM  . ipratropium-albuterol  3 mL Nebulization BID  . levothyroxine  137 mcg Oral QAC breakfast  . methylPREDNISolone (SOLU-MEDROL) injection  40 mg Intravenous Q12H  . polyethylene glycol  17 g Oral QODAY  . potassium chloride SA  20 mEq Oral q morning - 10a    LOS: 3 days    Time spent: 30 minutes    Jamair Cato Darleen Crocker, DO [facility]  If 7PM-7AM, please contact night-coverage [email or web address] 10/20/2019, 9:56 AM

## 2019-10-20 NOTE — Discharge Instructions (Signed)

## 2019-10-20 NOTE — Plan of Care (Signed)
  Problem: Health Behavior/Discharge Planning: Goal: Ability to manage health-related needs will improve Outcome: Progressing   Problem: Clinical Measurements: Goal: Ability to maintain clinical measurements within normal limits will improve Outcome: Progressing Goal: Will remain free from infection Outcome: Progressing Goal: Diagnostic test results will improve Outcome: Progressing Goal: Respiratory complications will improve Outcome: Progressing Goal: Cardiovascular complication will be avoided Outcome: Progressing   Problem: Activity: Goal: Risk for activity intolerance will decrease Outcome: Progressing   Problem: Nutrition: Goal: Adequate nutrition will be maintained Outcome: Progressing   Problem: Elimination: Goal: Will not experience complications related to bowel motility Outcome: Progressing Goal: Will not experience complications related to urinary retention Outcome: Progressing   Problem: Safety: Goal: Ability to remain free from injury will improve Outcome: Progressing   Problem: Skin Integrity: Goal: Risk for impaired skin integrity will decrease Outcome: Progressing   Problem: Education: Goal: Knowledge of disease or condition will improve Outcome: Progressing Goal: Knowledge of the prescribed therapeutic regimen will improve Outcome: Progressing Goal: Individualized Educational Video(s) Outcome: Progressing   Problem: Pain Managment: Goal: General experience of comfort will improve Outcome: Progressing

## 2019-10-20 NOTE — Progress Notes (Signed)
Physical Therapy Treatment Patient Details Name: Heather [name] MRN: [id number] DOB: [birth year] Today's Date: 10/20/2019    History of Present Illness Heather [name] is a 86 y.o. female with medical history significant for Hx of COPD with chronic hypoxemia on 3L, paroxysmal atrial fibrillation on Eliquis, hypertension, and hypothyroidism who presents with worsening shortness of breath.     PT Comments    Pt was found sitting upright in chair at the beginning of the session. She reported feeling better today than yesterday. Droplet precautions were discontinued. Patient experienced urinary incontinence upon sit to stand transfer and was transferred to [facility] and stated that she typically experiences urinary incontinence when she moves to the standing position. Patient required Min Guard assistance in all transfers and throughout gait training. Pt ambulated 44' with Min Guard assistance provided by SPT. When returning back into her room from the hallway, pt reported feeling SOB with upper abdominal/diaphragmatic pain, which resolved within minutes through deep breathing and relaxation techniques. O2 saturation and HR remained WNL throughout the session. Pt was left sitting upright in the chair, with all lines connected, and all needs within reach with the chair alarm on and connected.    Follow Up Recommendations  Home health PT     Equipment Recommendations  None recommended by PT    Recommendations for Other Services       Precautions / Restrictions Precautions Precautions: Fall Precaution Comments: watch o2 sat    Mobility  Bed Mobility               General bed mobility comments: up in recliner on entry  Transfers Overall transfer level: Needs assistance Equipment used: Rolling walker (2 wheeled)   Sit to Stand: Min guard Stand pivot transfers: Min guard       General transfer comment: min guard with RW, min VC for safety; had incontinent urination in standing then  transfered to [facility]  Ambulation/Gait Ambulation/Gait assistance: Min guard Gait Distance (Feet): 80 Feet Assistive device: Rolling walker (2 wheeled) Gait Pattern/deviations: Step-through pattern;Trunk flexed Gait velocity: decreased   General Gait Details: slow gait velocity, tolerated exercise well during walking, did not feel SOB in hallway, because very fatigued and SOB when returning to room. c/o upper abdominal pain and SOB at end of walk. O2 and HR remained WNL   Stairs             Wheelchair Mobility    Modified Rankin (Stroke Patients Only)       Balance Overall balance assessment: Needs assistance Sitting-balance support: Feet supported Sitting balance-Leahy Scale: Good Sitting balance - Comments: leaning to don socks without LOB   Standing balance support: Single extremity supported;During functional activity Standing balance-Leahy Scale: Fair Standing balance comment: requires B UE support to maintain standing balance                            Cognition Arousal/Alertness: Awake/alert Behavior During Therapy: WFL for tasks assessed/performed Overall Cognitive Status: Within Functional Limits for tasks assessed                                        Exercises      General Comments General comments (skin integrity, edema, etc.): 2LPM O2 92-95% with transfers to/from [facility] and while ambulating in hallway. Upper abdominal pain and SOB at the end of gait training  when returning to room. Was instructed how to use Incentive Spirometer during session and was informed to do 10 deep breaths/hour.      Pertinent Vitals/Pain Pain Assessment: Faces Faces Pain Scale: Hurts little more Pain Location: upper abdominal/diaphragm pain after walking Pain Descriptors / Indicators: Cramping;Aching;Discomfort Pain Intervention(s): Utilized relaxation techniques;Monitored during session;Repositioned;Relaxation    Home Living                       Prior Function            PT Goals (current goals can now be found in the care plan section) Acute Rehab PT Goals Patient Stated Goal: go home and be active like I was PT Goal Formulation: With patient Time For Goal Achievement: 11/02/19 Potential to Achieve Goals: Good Progress towards PT goals: Progressing toward goals    Frequency    Min 3X/week      PT Plan Current plan remains appropriate    Co-evaluation              AM-PAC PT "6 Clicks" Mobility   Outcome Measure  Help needed turning from your back to your side while in a flat bed without using bedrails?: A Little Help needed moving from lying on your back to sitting on the side of a flat bed without using bedrails?: A Little Help needed moving to and from a bed to a chair (including a wheelchair)?: A Little Help needed standing up from a chair using your arms (e.g., wheelchair or bedside chair)?: A Little Help needed to walk in hospital room?: A Little Help needed climbing 3-5 steps with a railing? : A Little 6 Click Score: 18    End of Session Equipment Utilized During Treatment: Oxygen;Gait belt Activity Tolerance: Patient tolerated treatment well Patient left: in chair;with call bell/phone within reach;with chair alarm set   PT Visit Diagnosis: Muscle weakness (generalized) (M62.81);Unsteadiness on feet (R26.81)     Time: 9407-6808 PT Time Calculation (min) (ACUTE ONLY): 27 min  Charges:  $Gait Training: 8-22 mins $Therapeutic Activity: 8-22 mins                        Livingston Diones, SPT 10/20/2019, 12:43 PM

## 2019-10-21 MED ORDER — AZITHROMYCIN 250 MG PO TABS: 250.0000 mg | ORAL_TABLET | Freq: Every day | ORAL | 0 refills | Status: AC

## 2019-10-21 MED ORDER — ENSURE ENLIVE PO LIQD: 237.0000 mL | Freq: Two times a day (BID) | ORAL | 12 refills | Status: AC

## 2019-10-21 MED ORDER — CEFDINIR 300 MG PO CAPS: 600.0000 mg | ORAL_CAPSULE | Freq: Every day | ORAL | 0 refills | Status: AC

## 2019-10-21 MED ORDER — PREDNISONE 10 MG PO TABS
40.0000 mg | ORAL_TABLET | Freq: Every day | ORAL | 0 refills | Status: AC
Start: 2019-10-21 — End: 2019-10-26

## 2019-10-21 MED ORDER — GUAIFENESIN ER 600 MG PO TB12: 600.0000 mg | ORAL_TABLET | Freq: Two times a day (BID) | ORAL | 0 refills | Status: AC

## 2019-10-21 MED FILL — AZITHROMYCIN 250 MG TABLET: 250 | 1 days supply | Qty: 1 | Fill #0

## 2019-10-21 MED FILL — CEFDINIR 300 MG CAPSULE: 300 | 1 days supply | Qty: 2 | Fill #0

## 2019-10-21 MED FILL — predniSONE 10 MG TABS: 10 | 5 days supply | Qty: 20 | Fill #0

## 2019-10-21 MED FILL — MUCUS RELIEF 600 MG TB12: 600 | 10 days supply | Qty: 20 | Fill #0

## 2019-10-21 NOTE — Care Management Important Message (Signed)
Important Message  Patient Details  Name: HARMONY SANDELL MRN: [id number] Date of Birth: [birth year]   Medicare Important Message Given:  Yes  Patient left prior to IM delivery.  IM mailed to the patient home address.    Izic Stfort 10/21/2019, 3:20 PM

## 2019-10-21 NOTE — Progress Notes (Signed)
Physical Therapy Treatment Patient Details Name: Heather [name] MRN: [id number] DOB: Jan 17, 1927 Today's Date: 10/21/2019    History of Present Illness Heather [name] is a 86 y.o. female with medical history significant for Hx of COPD with chronic hypoxemia on 3L, paroxysmal atrial fibrillation on Eliquis, hypertension, and hypothyroidism who presents with worsening shortness of breath.     PT Comments    Pt was received in chair at the start of the session. Stated she was feeling much better than yesterday and excited to go home. She was transferred chair <> BSC with MinAx1 provided and use of a handheld assist for safety. Pt tolerated standing exercises of standing marches x 20 and standing calf raises x 12 well, with use of BUEs on RW and Min Guard assist for safety. She successfully ambulated 22' in the hallway with use of RW and Min Guard assist for safety.  Pt felt SOB and was coughing after exertion, however O2 Sat and HR were monitored throughout session and remained WNL for all activities. She was left sitting up in the recliner chair with all lines attached, the chair alarm turned on, and all needs within reach.    Follow Up Recommendations  Home health PT     Equipment Recommendations  None recommended by PT    Recommendations for Other Services       Precautions / Restrictions Precautions Precautions: Fall Precaution Comments: watch o2 sat and HR Restrictions Weight Bearing Restrictions: No    Mobility  Bed Mobility               General bed mobility comments: up in recliner on entry  Transfers Overall transfer level: Needs assistance Equipment used: Rolling walker (2 wheeled) Transfers: Sit to/from [facility] Sit to Stand: Min guard Stand pivot transfers: Min assist       General transfer comment: minAx1 with handheld assist in transfer to [facility]  Ambulation/Gait Ambulation/Gait assistance: Min guard Gait Distance (Feet): 60  Feet Assistive device: Rolling walker (2 wheeled) Gait Pattern/deviations: Step-through pattern;Trunk flexed Gait velocity: decreased   General Gait Details: slow gait velocity, tolerated exercise well. SOB post-ambulation. O2 sat and HR remained WNL.   Stairs             Wheelchair Mobility    Modified Rankin (Stroke Patients Only)       Balance Overall balance assessment: Needs assistance Sitting-balance support: Feet supported Sitting balance-Leahy Scale: Good     Standing balance support: Bilateral upper extremity supported Standing balance-Leahy Scale: Fair Standing balance comment: requires B UE support to maintain standing balance. maintained balance during standing marches and calf raises well with use of BUE on RW                            Cognition Arousal/Alertness: Awake/alert Behavior During Therapy: WFL for tasks assessed/performed Overall Cognitive Status: Within Functional Limits for tasks assessed                                        Exercises      General Comments General comments (skin integrity, edema, etc.): 4LPM O2 >94% with transfer to [facility], standing exercises, and while ambulating in hallway. Reviewed use of Incentive Spirometer as she had stated she had not used it since last session.      Pertinent Vitals/Pain Pain Assessment: No/denies  pain Pain Score: 0-No pain Faces Pain Scale: No hurt    Home Living                      Prior Function            PT Goals (current goals can now be found in the care plan section) Acute Rehab PT Goals Patient Stated Goal: go home and be active like I was PT Goal Formulation: With patient Time For Goal Achievement: 11/02/19 Potential to Achieve Goals: Good Progress towards PT goals: Progressing toward goals    Frequency    Min 3X/week      PT Plan Current plan remains appropriate    Co-evaluation              AM-PAC PT "6 Clicks"  Mobility   Outcome Measure  Help needed turning from your back to your side while in a flat bed without using bedrails?: A Little Help needed moving from lying on your back to sitting on the side of a flat bed without using bedrails?: A Little Help needed moving to and from a bed to a chair (including a wheelchair)?: A Little Help needed standing up from a chair using your arms (e.g., wheelchair or bedside chair)?: A Little Help needed to walk in hospital room?: A Little Help needed climbing 3-5 steps with a railing? : A Little 6 Click Score: 18    End of Session Equipment Utilized During Treatment: Oxygen;Gait belt Activity Tolerance: Patient tolerated treatment well Patient left: in chair;with call bell/phone within reach;with chair alarm set   PT Visit Diagnosis: Muscle weakness (generalized) (M62.81);Unsteadiness on feet (R26.81)     Time: 4665-9935 PT Time Calculation (min) (ACUTE ONLY): 25 min  Charges:  $Gait Training: 8-22 mins $Therapeutic Exercise: 8-22 mins                     Livingston Diones, SPT, ATC

## 2019-10-21 NOTE — Discharge Summary (Signed)
Physician Discharge Summary  Heather [name] ZOX:[id number] DOB: 20-Mar-1927 DOA: 10/17/2019  PCP: Burnard Bunting, MD  Admit date: 10/17/2019  Discharge date: 10/21/2019  Admitted From:Home  Disposition:  Home  Recommendations for Outpatient Follow-up:  1. Follow up with PCP in 1-2 weeks, follow-up BMP in 1 week 2. Continue on Mucinex as well as breathing treatments as needed for symptoms of shortness of breath, cough, or wheezing 3. Continue on prednisone 40 mg daily for the next 5 days as prescribed 4. Continue on azithromycin and Omnicef for 1 more day to complete 5-day course of treatment 5. Hold home Lasix, potassium, and losartan for now until repeat BMP and blood pressure recheck on office visit in the next 1-2 weeks. 6. Follow-up chest CT for lung nodule with pulmonologist Dr. Lamonte Sakai  Home Health: Yes with PT  Equipment/Devices: Has chronic 3-4 L nasal cannula oxygen  Discharge Condition: Stable  CODE STATUS: DNR  Diet recommendation: Heart Healthy  Brief/Interim Summary: 86 year old female with a history of chronic respiratory failure on 3 L, severe COPD, paroxysmal atrial fibrillation on apixaban, hypothyroidism, hypertension, peripheral neuropathy presenting with 4-day history of shortness of breath and decreased oral intake. She is complaining of increasing cough with yellow sputum. She denies any mopped assist. The patient has had some subjective chills. She states that she was exposed to her son-in-law who had a similar illness 2 days prior to the onset of her own symptoms. She increased her home oxygen demand from 3 to 5 L at home. She continued to have shortness of breath. To result, she presented for further evaluation. Chest x-ray in the emergency department did not show any consolidations. It showed a chronically elevated right hemidiaphragm with hyperinflation. BMP and LFTs were unremarkable. WBC 10.3, hemoglobin 14.4, platelets 230,000. The patient was  started on bronchodilators and IV Solu-Medrol for COPD exacerbation.  Acute on chronic respiratory failure with hypoxia-resolved -Secondary COPD exacerbation -Back to usual 3-4 L nasal cannula oxygen at home  COPD exacerbation-resolved -Triggered by coronavirus infection(NOT SARS-CoV2) -Viral respiratory panel positive for coronavirus OC 43 -Continue on prednisone as prescribed for 5 more days -Continue breathing treatments at home as needed -Will remain on Mucinex as well as flutter valve  SIRS -Sepsis ruled out -Lactic acid 1.6 -Personally reviewed chest x-ray--no consolidation -Initially presented with tachycardia and fever up to 1 to 101.4 F  Paroxysmal atrial fibrillation -Currently in sinus rhythm -Continue Cardizem CD 180 mg daily -Continue apixaban  Right middle lobe lung nodule -Noted on CT 10/13/2019 -This was a surveillance CT ordered by the patient'spulmonologist--Dr. Lamonte Sakai -Nodule measured 2.0 x 1.3 cm(previously 1.8 x 1.2 cm) -Outpatient follow-up  Hypothyroidism -Continue levothyroxine  Essential hypertension -Continue Cardizem CD -Hold losartan and Lasix for now his blood pressures have remained controlled  Hypokalemia-repleted -Hold home potassium supplementation while off Lasix  Chest pain-resolved -atypical by hx -personally reviewed EKG--sinus, nonspecific ST changes -troponin 8>>9  Discharge Diagnoses:  Principal Problem:   COPD exacerbation ([place]) Active Problems:   PAF (paroxysmal atrial fibrillation) ([facility])   HTN (hypertension)   Community acquired pneumonia   Sepsis ([place])   Hypothyroidism   Acute respiratory failure with hypoxia ([place])   Acute on chronic respiratory failure with hypoxia ([place])  Principal discharge diagnosis: Acute on chronic hypoxemic respiratory failure secondary to COPD exacerbation.  Discharge Instructions  Discharge Instructions    Diet - low sodium heart healthy   Complete by: As directed    Increase  activity slowly   Complete by: As directed  Allergies as of 10/21/2019      Reactions   Ace Inhibitors Cough   Sulfur Nausea And Vomiting      Medication List    STOP taking these medications   furosemide 40 MG tablet Commonly known as: LASIX   losartan 50 MG tablet Commonly known as: COZAAR   potassium chloride SA 20 MEQ tablet Commonly known as: KLOR-CON     TAKE these medications   acetaminophen 650 MG CR tablet Commonly known as: TYLENOL Take 1,300 mg by mouth daily as needed for pain.   acetaminophen 325 MG tablet Commonly known as: TYLENOL Take 2 tablets (650 mg total) by mouth every 6 (six) hours as needed for mild pain or headache (fever >/= 101).   albuterol (2.5 MG/3ML) 0.083% nebulizer solution Commonly known as: PROVENTIL Take 2.5 mg by nebulization every 6 (six) hours as needed for wheezing or shortness of breath.   albuterol 108 (90 Base) MCG/ACT inhaler Commonly known as: VENTOLIN HFA Inhale 2 puffs into the lungs every 6 (six) hours as needed for wheezing or shortness of breath.   apixaban 2.5 MG Tabs tablet Commonly known as: Eliquis Take 1 tablet (2.5 mg total) by mouth 2 (two) times daily. What changed: when to take this   azithromycin 250 MG tablet Commonly known as: ZITHROMAX Take 1 tablet (250 mg total) by mouth daily for 1 day. Take until 10/21/19   BIOGAIA PROBIOTIC PO Take 1 capsule by mouth daily.   calcium citrate-vitamin D 315-200 MG-UNIT tablet Commonly known as: CITRACAL+D Take 1 tablet by mouth daily.   cefdinir 300 MG capsule Commonly known as: OMNICEF Take 2 capsules (600 mg total) by mouth daily at 6 PM for 1 day. Take until 10/21/19   diltiazem 180 MG 24 hr capsule Commonly known as: CARDIZEM CD Take 180 mg by mouth every morning.   feeding supplement (ENSURE ENLIVE) Liqd Take 237 mLs by mouth 2 (two) times daily between meals.   fluticasone 50 MCG/ACT nasal spray Commonly known as: FLONASE Place 2 sprays into  both nostrils at bedtime.   Forteo 620 MCG/2.48ML Sopn Generic drug: Teriparatide (Recombinant) Inject 20 mcg into the skin daily before breakfast.   guaiFENesin 600 MG 12 hr tablet Commonly known as: Mucinex Take 1 tablet (600 mg total) by mouth 2 (two) times daily for 10 days.   guaiFENesin-dextromethorphan 100-10 MG/5ML syrup Commonly known as: ROBITUSSIN DM Take 5 mLs by mouth every 6 (six) hours as needed for cough.   levothyroxine 137 MCG tablet Commonly known as: SYNTHROID Take 137 mcg by mouth daily before breakfast.   multivitamin with minerals tablet Take 1 tablet by mouth daily.   polyethylene glycol 17 g packet Commonly known as: MIRALAX / GLYCOLAX Take 17 g by mouth every other day.   predniSONE 10 MG tablet Commonly known as: DELTASONE Take 4 tablets (40 mg total) by mouth daily for 5 days.   Symbicort 160-4.5 MCG/ACT inhaler Generic drug: budesonide-formoterol Inhale 2 puffs into the lungs 2 (two) times daily.   THERATEARS OP Place 2 drops into both eyes daily.       Follow-up Information    Burnard Bunting, MD Follow up in 1 week(s).   Specialty: Internal Medicine Contact information: [place] [address] [phone]              Allergies  Allergen Reactions  . Ace Inhibitors Cough  . Sulfur Nausea And Vomiting    Consultations:  None   Procedures/Studies: DG  Chest Port 1 View  Result Date: 10/17/2019 CLINICAL DATA:  SOB. COPD. EXAM: PORTABLE CHEST 1 VIEW COMPARISON:  Chest radiograph 01/06/2019, CT chest 10/13/2019 FINDINGS: Stable cardiomediastinal contours. Persistent asymmetric elevation of the right hemidiaphragm. The lungs are hyperexpanded. Emphysema. No new focal opacity. The patient's known right middle lobe nodule is not well visualized radiographically. No pneumothorax or significant pleural effusion. No acute finding in the visualized skeleton. IMPRESSION: 1. No acute cardiopulmonary finding. 2. COPD  and emphysema. 3. The patient's known right middle lobe pulmonary nodule is not well seen radiographically. Electronically Signed   By: Audie Pinto M.D.   On: 10/17/2019 17:17   CT Super D Chest Wo Contrast  Result Date: 10/13/2019 CLINICAL DATA:  Follow-up pulmonary nodule. EXAM: CT CHEST WITHOUT CONTRAST TECHNIQUE: Multidetector CT imaging of the chest was performed using thin slice collimation for electromagnetic bronchoscopy planning purposes, without intravenous contrast. COMPARISON:  04/22/2019 FINDINGS: Cardiovascular: The heart size is normal. Aortic atherosclerosis noted. Three vessel coronary artery calcifications. No pericardial effusion. Mediastinum/Nodes: No enlarged mediastinal, hilar, or axillary lymph nodes. Thyroid gland, trachea, and esophagus demonstrate no significant findings. Lungs/Pleura: Asymmetric elevation of right hemidiaphragm is again noted. No pleural effusion. Severe changes of centrilobular and paraseptal emphysema. Atelectasis versus scar noted within the lingula and right lower lobe, unchanged. Pulmonary nodule, with small central calcification, within the medial right middle lobe measures 2.0 by 1.3 cm, image 125/2. This is compared with 1.8 x 1.2 cm (when remeasured). On 01/25/18 this measured 0.7 x 0.8 cm. Upper Abdomen: Extensive aortic atherosclerosis is identified within the imaged portions of the upper abdomen. Evidence of branch vessel involvement is noted. No acute abnormality identified. Musculoskeletal: The bones appear diffusely osteopenic. Multi level compression deformities within the mid and lower thoracic spine are unchanged. No new compression fractures multiple healed left lateral rib deformities are identified, likely posttraumatic. IMPRESSION: 1. Continual gradual increase in size of right middle lobe lung nodule which remains worrisome for pulmonary malignancy. 2. Emphysema and aortic atherosclerosis. 3. Three vessel coronary artery calcifications  noted. Aortic Atherosclerosis (ICD10-I70.0) and Emphysema (ICD10-J43.9). Electronically Signed   By: Kerby Moors M.D.   On: 10/13/2019 15:58     Discharge Exam: Vitals:   10/21/19 0829 10/21/19 0831  BP:    Pulse:    Resp:    Temp:    SpO2: 98% 98%   Vitals:   10/21/19 0805 10/21/19 0827 10/21/19 0829 10/21/19 0831  BP: (!) 146/75     Pulse: 88     Resp:      Temp: 97.9 F (36.6 C)     TempSrc: Oral     SpO2: (!) 84% 98% 98% 98%  Weight:      Height:        General: Pt is alert, awake, not in acute distress Cardiovascular: RRR, S1/S2 +, no rubs, no gallops Respiratory: CTA bilaterally, no wheezing, no rhonchi, good breath sounds bilaterally, currently on 3 L nasal cannula oxygen. Abdominal: Soft, NT, ND, bowel sounds + Extremities: no edema, no cyanosis    The results of significant diagnostics from this hospitalization (including imaging, microbiology, ancillary and laboratory) are listed below for reference.     Microbiology: Recent Results (from the past 240 hour(s))  Culture, blood (routine x 2)     Status: None (Preliminary result)   Collection Time: 10/17/19  4:48 PM   Specimen: BLOOD  Result Value Ref Range Status   Specimen Description BLOOD SITE NOT SPECIFIED  Final   Special  Requests   Final    BOTTLES DRAWN AEROBIC AND ANAEROBIC Blood Culture adequate volume   Culture   Final    NO GROWTH 4 DAYS Performed at [facility], [place] [address] [place], [place] [address]    Report Status PENDING  Incomplete  Culture, blood (routine x 2)     Status: None (Preliminary result)   Collection Time: 10/17/19  5:00 PM   Specimen: BLOOD RIGHT ARM  Result Value Ref Range Status   Specimen Description BLOOD RIGHT ARM  Final   Special Requests   Final    BOTTLES DRAWN AEROBIC ONLY Blood Culture results may not be optimal due to an inadequate volume of blood received in culture bottles   Culture   Final    NO GROWTH 4 DAYS Performed at [facility], [place] [address], [place], [place] [address]    Report Status PENDING  Incomplete  Urine culture     Status: Abnormal   Collection Time: 10/17/19  6:06 PM   Specimen: Urine, Clean Catch  Result Value Ref Range Status   Specimen Description URINE, CLEAN CATCH  Final   Special Requests   Final    NONE Performed at [facility], [place] [address] [place], [place] [address]    Culture MULTIPLE SPECIES PRESENT, SUGGEST RECOLLECTION (A)  Final   Report Status 10/18/2019 FINAL  Final  SARS Coronavirus 2 by RT PCR (hospital order, performed in [facility] hospital lab) Nasopharyngeal Nasopharyngeal Swab     Status: None   Collection Time: 10/17/19  7:19 PM   Specimen: Nasopharyngeal Swab  Result Value Ref Range Status   SARS Coronavirus 2 NEGATIVE NEGATIVE Final    Comment: (NOTE) SARS-CoV-2 target nucleic acids are NOT DETECTED.  The SARS-CoV-2 RNA is generally detectable in upper and lower respiratory specimens during the acute phase of infection. The lowest concentration of SARS-CoV-2 viral copies this assay can detect is 250 copies / mL. A negative result does not preclude SARS-CoV-2 infection and should not be used as the sole basis for treatment or other patient management decisions.  A negative result may occur with improper specimen collection / handling, submission of specimen other than nasopharyngeal swab, presence of viral mutation(s) within the areas targeted by this assay, and inadequate number of viral copies (<250 copies / mL). A negative result must be combined with clinical observations, patient history, and epidemiological information.  Fact Sheet for Patients:   StrictlyIdeas.no  Fact Sheet for Healthcare Providers: BankingDealers.co.za  This test is not yet approved or  cleared by the [place] FDA and has been authorized for detection and/or diagnosis of SARS-CoV-2 by FDA under an Emergency Use Authorization  (EUA).  This EUA will remain in effect (meaning this test can be used) for the duration of the COVID-19 declaration under Section 564(b)(1) of the Act, 21 U.S.C. section 360bbb-3(b)(1), unless the authorization is terminated or revoked sooner.  Performed at [facility], [place] [address], [place],  [address]   Respiratory Panel by PCR     Status: Abnormal   Collection Time: 10/17/19  7:19 PM   Specimen: Nasopharyngeal Swab; Respiratory  Result Value Ref Range Status   Adenovirus NOT DETECTED NOT DETECTED Final   Coronavirus 229E NOT DETECTED NOT DETECTED Final    Comment: (NOTE) The Coronavirus on the Respiratory Panel, DOES NOT test for the novel  Coronavirus (2019 nCoV)    Coronavirus HKU1 NOT DETECTED NOT DETECTED Final  Coronavirus NL63 NOT DETECTED NOT DETECTED Final   Coronavirus OC43 DETECTED (A) NOT DETECTED Final   Metapneumovirus NOT DETECTED NOT DETECTED Final   Rhinovirus / Enterovirus NOT DETECTED NOT DETECTED Final   Influenza A NOT DETECTED NOT DETECTED Final   Influenza B NOT DETECTED NOT DETECTED Final   Parainfluenza Virus 1 NOT DETECTED NOT DETECTED Final   Parainfluenza Virus 2 NOT DETECTED NOT DETECTED Final   Parainfluenza Virus 3 NOT DETECTED NOT DETECTED Final   Parainfluenza Virus 4 NOT DETECTED NOT DETECTED Final   Respiratory Syncytial Virus NOT DETECTED NOT DETECTED Final   Bordetella pertussis NOT DETECTED NOT DETECTED Final   Chlamydophila pneumoniae NOT DETECTED NOT DETECTED Final   Mycoplasma pneumoniae NOT DETECTED NOT DETECTED Final    Comment: Performed at [facility], [place] [address] [place], [place] [address]     Labs: BNP (last 3 results) Recent Labs    10/17/19 1700  BNP 70.1   Basic Metabolic Panel: Recent Labs  Lab 10/17/19 1700 10/18/19 0751 10/19/19 0251 10/20/19 0220  NA 137 138 140 139  K 3.4* 4.1 4.1 4.6  CL 93* 100 103 103  CO2 33* 31 29 27   GLUCOSE 153* 189* 191* 220*  BUN 18 18 19  26*   CREATININE 0.78 0.69 0.58 0.74  CALCIUM 9.4 8.6* 9.0 9.3  MG  --  2.3 2.2  --    Liver Function Tests: Recent Labs  Lab 10/17/19 1700  AST 27  ALT 25  ALKPHOS 82  BILITOT 1.3*  PROT 6.5  ALBUMIN 3.9   Recent Labs  Lab 10/17/19 1700  LIPASE 32   No results for input(s): AMMONIA in the last 168 hours. CBC: Recent Labs  Lab 10/17/19 1700 10/18/19 0751 10/19/19 0251 10/20/19 0220  WBC 10.3 14.1* 13.8* 11.8*  NEUTROABS 8.5*  --   --   --   HGB 14.4 13.0 13.4 13.9  HCT 43.7 39.9 40.7 41.7  MCV 94.8 95.9 95.3 95.6  PLT 230 208 215 256   Cardiac Enzymes: No results for input(s): CKTOTAL, CKMB, CKMBINDEX, TROPONINI in the last 168 hours. BNP: Invalid input(s): POCBNP CBG: No results for input(s): GLUCAP in the last 168 hours. D-Dimer No results for input(s): DDIMER in the last 72 hours. Hgb A1c No results for input(s): HGBA1C in the last 72 hours. Lipid Profile No results for input(s): CHOL, HDL, LDLCALC, TRIG, CHOLHDL, LDLDIRECT in the last 72 hours. Thyroid function studies No results for input(s): TSH, T4TOTAL, T3FREE, THYROIDAB in the last 72 hours.  Invalid input(s): FREET3 Anemia work up No results for input(s): VITAMINB12, FOLATE, FERRITIN, TIBC, IRON, RETICCTPCT in the last 72 hours. Urinalysis    Component Value Date/Time   COLORURINE YELLOW 10/17/2019 [place] 10/17/2019 1755   LABSPEC 1.008 10/17/2019 1755   PHURINE 7.0 10/17/2019 1755   GLUCOSEU NEGATIVE 10/17/2019 1755   HGBUR NEGATIVE 10/17/2019 1755   [place] 10/17/2019 1755   [place] 10/17/2019 1755   PROTEINUR NEGATIVE 10/17/2019 1755   UROBILINOGEN 0.2 05/17/2013 1207   NITRITE NEGATIVE 10/17/2019 1755   LEUKOCYTESUR NEGATIVE 10/17/2019 1755   Sepsis Labs Invalid input(s): PROCALCITONIN,  WBC,  LACTICIDVEN Microbiology Recent Results (from the past 240 hour(s))  Culture, blood (routine x 2)     Status: None (Preliminary result)   Collection  Time: 10/17/19  4:48 PM   Specimen: BLOOD  Result Value Ref Range Status   Specimen Description BLOOD SITE NOT SPECIFIED  Final  Special Requests   Final    BOTTLES DRAWN AEROBIC AND ANAEROBIC Blood Culture adequate volume   Culture   Final    NO GROWTH 4 DAYS Performed at [facility], [place] [address], [place], [place] [address]    Report Status PENDING  Incomplete  Culture, blood (routine x 2)     Status: None (Preliminary result)   Collection Time: 10/17/19  5:00 PM   Specimen: BLOOD RIGHT ARM  Result Value Ref Range Status   Specimen Description BLOOD RIGHT ARM  Final   Special Requests   Final    BOTTLES DRAWN AEROBIC ONLY Blood Culture results may not be optimal due to an inadequate volume of blood received in culture bottles   Culture   Final    NO GROWTH 4 DAYS Performed at [facility], [place] [address], [place], [place] [address]    Report Status PENDING  Incomplete  Urine culture     Status: Abnormal   Collection Time: 10/17/19  6:06 PM   Specimen: Urine, Clean Catch  Result Value Ref Range Status   Specimen Description URINE, CLEAN CATCH  Final   Special Requests   Final    NONE Performed at [facility], [place] [address], [place], [place] [address]    Culture MULTIPLE SPECIES PRESENT, SUGGEST RECOLLECTION (A)  Final   Report Status 10/18/2019 FINAL  Final  SARS Coronavirus 2 by RT PCR (hospital order, performed in [facility] hospital lab) Nasopharyngeal Nasopharyngeal Swab     Status: None   Collection Time: 10/17/19  7:19 PM   Specimen: Nasopharyngeal Swab  Result Value Ref Range Status   SARS Coronavirus 2 NEGATIVE NEGATIVE Final    Comment: (NOTE) SARS-CoV-2 target nucleic acids are NOT DETECTED.  The SARS-CoV-2 RNA is generally detectable in upper and lower respiratory specimens during the acute phase of infection. The lowest concentration of SARS-CoV-2 viral copies this assay can detect is 250 copies / mL. A negative result does not  preclude SARS-CoV-2 infection and should not be used as the sole basis for treatment or other patient management decisions.  A negative result may occur with improper specimen collection / handling, submission of specimen other than nasopharyngeal swab, presence of viral mutation(s) within the areas targeted by this assay, and inadequate number of viral copies (<250 copies / mL). A negative result must be combined with clinical observations, patient history, and epidemiological information.  Fact Sheet for Patients:   StrictlyIdeas.no  Fact Sheet for Healthcare Providers: BankingDealers.co.za  This test is not yet approved or  cleared by the [place] FDA and has been authorized for detection and/or diagnosis of SARS-CoV-2 by FDA under an Emergency Use Authorization (EUA).  This EUA will remain in effect (meaning this test can be used) for the duration of the COVID-19 declaration under Section 564(b)(1) of the Act, 21 U.S.C. section 360bbb-3(b)(1), unless the authorization is terminated or revoked sooner.  Performed at [facility], [place] [address] [place], [place] [address]   Respiratory Panel by PCR     Status: Abnormal   Collection Time: 10/17/19  7:19 PM   Specimen: Nasopharyngeal Swab; Respiratory  Result Value Ref Range Status   Adenovirus NOT DETECTED NOT DETECTED Final   Coronavirus 229E NOT DETECTED NOT DETECTED Final    Comment: (NOTE) The Coronavirus on the Respiratory Panel, DOES NOT test for the novel  Coronavirus (2019 nCoV)    Coronavirus HKU1 NOT DETECTED NOT DETECTED Final  Coronavirus NL63 NOT DETECTED NOT DETECTED Final   Coronavirus OC43 DETECTED (A) NOT DETECTED Final   Metapneumovirus NOT DETECTED NOT DETECTED Final   Rhinovirus / Enterovirus NOT DETECTED NOT DETECTED Final   Influenza A NOT DETECTED NOT DETECTED Final   Influenza B NOT DETECTED NOT DETECTED Final   Parainfluenza Virus 1 NOT  DETECTED NOT DETECTED Final   Parainfluenza Virus 2 NOT DETECTED NOT DETECTED Final   Parainfluenza Virus 3 NOT DETECTED NOT DETECTED Final   Parainfluenza Virus 4 NOT DETECTED NOT DETECTED Final   Respiratory Syncytial Virus NOT DETECTED NOT DETECTED Final   Bordetella pertussis NOT DETECTED NOT DETECTED Final   Chlamydophila pneumoniae NOT DETECTED NOT DETECTED Final   Mycoplasma pneumoniae NOT DETECTED NOT DETECTED Final    Comment: Performed at [facility], [place] [address] [place], [place] [address]     Time coordinating discharge: 35 minutes  SIGNED:   Rodena Goldmann, DO [facility] 10/21/2019, 9:28 AM  If 7PM-7AM, please contact night-coverage [email or web address]

## 2019-10-22 LAB — CULTURE, BLOOD (ROUTINE X 2)
Culture: NO GROWTH
Culture: NO GROWTH
Special Requests: ADEQUATE

## 2019-10-26 DIAGNOSIS — J9610 Chronic respiratory failure, unspecified whether with hypoxia or hypercapnia: Secondary | ICD-10-CM | POA: Diagnosis not present

## 2019-10-26 DIAGNOSIS — G629 Polyneuropathy, unspecified: Secondary | ICD-10-CM | POA: Diagnosis not present

## 2019-10-26 DIAGNOSIS — I110 Hypertensive heart disease with heart failure: Secondary | ICD-10-CM | POA: Diagnosis not present

## 2019-10-26 DIAGNOSIS — I5032 Chronic diastolic (congestive) heart failure: Secondary | ICD-10-CM | POA: Diagnosis not present

## 2019-10-26 DIAGNOSIS — M5489 Other dorsalgia: Secondary | ICD-10-CM | POA: Diagnosis not present

## 2019-10-26 DIAGNOSIS — Z9981 Dependence on supplemental oxygen: Secondary | ICD-10-CM | POA: Diagnosis not present

## 2019-10-26 DIAGNOSIS — R911 Solitary pulmonary nodule: Secondary | ICD-10-CM | POA: Diagnosis not present

## 2019-10-26 DIAGNOSIS — J449 Chronic obstructive pulmonary disease, unspecified: Secondary | ICD-10-CM | POA: Diagnosis not present

## 2019-10-26 DIAGNOSIS — I480 Paroxysmal atrial fibrillation: Secondary | ICD-10-CM | POA: Diagnosis not present

## 2019-11-03 ENCOUNTER — Other Ambulatory Visit: Payer: Self-pay | Admitting: Internal Medicine

## 2019-11-10 DIAGNOSIS — J449 Chronic obstructive pulmonary disease, unspecified: Secondary | ICD-10-CM | POA: Diagnosis not present

## 2019-11-12 DIAGNOSIS — J449 Chronic obstructive pulmonary disease, unspecified: Secondary | ICD-10-CM | POA: Diagnosis not present

## 2019-11-14 DIAGNOSIS — N189 Chronic kidney disease, unspecified: Secondary | ICD-10-CM | POA: Diagnosis not present

## 2019-11-14 DIAGNOSIS — J449 Chronic obstructive pulmonary disease, unspecified: Secondary | ICD-10-CM | POA: Diagnosis not present

## 2019-11-14 DIAGNOSIS — Z9981 Dependence on supplemental oxygen: Secondary | ICD-10-CM | POA: Diagnosis not present

## 2019-11-14 DIAGNOSIS — I480 Paroxysmal atrial fibrillation: Secondary | ICD-10-CM | POA: Diagnosis not present

## 2019-11-14 DIAGNOSIS — I5032 Chronic diastolic (congestive) heart failure: Secondary | ICD-10-CM | POA: Diagnosis not present

## 2019-11-14 DIAGNOSIS — M810 Age-related osteoporosis without current pathological fracture: Secondary | ICD-10-CM | POA: Diagnosis not present

## 2019-11-14 DIAGNOSIS — E46 Unspecified protein-calorie malnutrition: Secondary | ICD-10-CM | POA: Diagnosis not present

## 2019-11-14 DIAGNOSIS — I8310 Varicose veins of unspecified lower extremity with inflammation: Secondary | ICD-10-CM | POA: Diagnosis not present

## 2019-11-14 DIAGNOSIS — G629 Polyneuropathy, unspecified: Secondary | ICD-10-CM | POA: Diagnosis not present

## 2019-11-14 DIAGNOSIS — E039 Hypothyroidism, unspecified: Secondary | ICD-10-CM | POA: Diagnosis not present

## 2019-11-14 DIAGNOSIS — I77810 Thoracic aortic ectasia: Secondary | ICD-10-CM | POA: Diagnosis not present

## 2019-11-14 DIAGNOSIS — I130 Hypertensive heart and chronic kidney disease with heart failure and stage 1 through stage 4 chronic kidney disease, or unspecified chronic kidney disease: Secondary | ICD-10-CM | POA: Diagnosis not present

## 2019-11-14 DIAGNOSIS — I110 Hypertensive heart disease with heart failure: Secondary | ICD-10-CM | POA: Diagnosis not present

## 2019-11-14 DIAGNOSIS — J9610 Chronic respiratory failure, unspecified whether with hypoxia or hypercapnia: Secondary | ICD-10-CM | POA: Diagnosis not present

## 2019-11-14 DIAGNOSIS — M5416 Radiculopathy, lumbar region: Secondary | ICD-10-CM | POA: Diagnosis not present

## 2019-11-14 DIAGNOSIS — J441 Chronic obstructive pulmonary disease with (acute) exacerbation: Secondary | ICD-10-CM | POA: Diagnosis not present

## 2019-11-18 DIAGNOSIS — I5032 Chronic diastolic (congestive) heart failure: Secondary | ICD-10-CM | POA: Diagnosis not present

## 2019-11-18 DIAGNOSIS — J9610 Chronic respiratory failure, unspecified whether with hypoxia or hypercapnia: Secondary | ICD-10-CM | POA: Diagnosis not present

## 2019-11-18 DIAGNOSIS — M5416 Radiculopathy, lumbar region: Secondary | ICD-10-CM | POA: Diagnosis not present

## 2019-11-18 DIAGNOSIS — J441 Chronic obstructive pulmonary disease with (acute) exacerbation: Secondary | ICD-10-CM | POA: Diagnosis not present

## 2019-11-18 DIAGNOSIS — I8310 Varicose veins of unspecified lower extremity with inflammation: Secondary | ICD-10-CM | POA: Diagnosis not present

## 2019-11-18 DIAGNOSIS — I480 Paroxysmal atrial fibrillation: Secondary | ICD-10-CM | POA: Diagnosis not present

## 2019-11-18 DIAGNOSIS — I130 Hypertensive heart and chronic kidney disease with heart failure and stage 1 through stage 4 chronic kidney disease, or unspecified chronic kidney disease: Secondary | ICD-10-CM | POA: Diagnosis not present

## 2019-11-18 DIAGNOSIS — N189 Chronic kidney disease, unspecified: Secondary | ICD-10-CM | POA: Diagnosis not present

## 2019-11-18 DIAGNOSIS — M810 Age-related osteoporosis without current pathological fracture: Secondary | ICD-10-CM | POA: Diagnosis not present

## 2019-11-21 DIAGNOSIS — M5416 Radiculopathy, lumbar region: Secondary | ICD-10-CM | POA: Diagnosis not present

## 2019-11-21 DIAGNOSIS — N189 Chronic kidney disease, unspecified: Secondary | ICD-10-CM | POA: Diagnosis not present

## 2019-11-21 DIAGNOSIS — I5032 Chronic diastolic (congestive) heart failure: Secondary | ICD-10-CM | POA: Diagnosis not present

## 2019-11-21 DIAGNOSIS — J9610 Chronic respiratory failure, unspecified whether with hypoxia or hypercapnia: Secondary | ICD-10-CM | POA: Diagnosis not present

## 2019-11-21 DIAGNOSIS — J441 Chronic obstructive pulmonary disease with (acute) exacerbation: Secondary | ICD-10-CM | POA: Diagnosis not present

## 2019-11-21 DIAGNOSIS — I8310 Varicose veins of unspecified lower extremity with inflammation: Secondary | ICD-10-CM | POA: Diagnosis not present

## 2019-11-21 DIAGNOSIS — I130 Hypertensive heart and chronic kidney disease with heart failure and stage 1 through stage 4 chronic kidney disease, or unspecified chronic kidney disease: Secondary | ICD-10-CM | POA: Diagnosis not present

## 2019-11-21 DIAGNOSIS — I480 Paroxysmal atrial fibrillation: Secondary | ICD-10-CM | POA: Diagnosis not present

## 2019-11-21 DIAGNOSIS — M810 Age-related osteoporosis without current pathological fracture: Secondary | ICD-10-CM | POA: Diagnosis not present

## 2019-11-25 DIAGNOSIS — M5416 Radiculopathy, lumbar region: Secondary | ICD-10-CM | POA: Diagnosis not present

## 2019-11-25 DIAGNOSIS — I8310 Varicose veins of unspecified lower extremity with inflammation: Secondary | ICD-10-CM | POA: Diagnosis not present

## 2019-11-25 DIAGNOSIS — M810 Age-related osteoporosis without current pathological fracture: Secondary | ICD-10-CM | POA: Diagnosis not present

## 2019-11-25 DIAGNOSIS — I5032 Chronic diastolic (congestive) heart failure: Secondary | ICD-10-CM | POA: Diagnosis not present

## 2019-11-25 DIAGNOSIS — I130 Hypertensive heart and chronic kidney disease with heart failure and stage 1 through stage 4 chronic kidney disease, or unspecified chronic kidney disease: Secondary | ICD-10-CM | POA: Diagnosis not present

## 2019-11-25 DIAGNOSIS — I480 Paroxysmal atrial fibrillation: Secondary | ICD-10-CM | POA: Diagnosis not present

## 2019-11-25 DIAGNOSIS — J441 Chronic obstructive pulmonary disease with (acute) exacerbation: Secondary | ICD-10-CM | POA: Diagnosis not present

## 2019-11-25 DIAGNOSIS — N189 Chronic kidney disease, unspecified: Secondary | ICD-10-CM | POA: Diagnosis not present

## 2019-11-25 DIAGNOSIS — J9610 Chronic respiratory failure, unspecified whether with hypoxia or hypercapnia: Secondary | ICD-10-CM | POA: Diagnosis not present

## 2019-11-28 DIAGNOSIS — I5032 Chronic diastolic (congestive) heart failure: Secondary | ICD-10-CM | POA: Diagnosis not present

## 2019-11-28 DIAGNOSIS — I480 Paroxysmal atrial fibrillation: Secondary | ICD-10-CM | POA: Diagnosis not present

## 2019-11-28 DIAGNOSIS — M5416 Radiculopathy, lumbar region: Secondary | ICD-10-CM | POA: Diagnosis not present

## 2019-11-28 DIAGNOSIS — M810 Age-related osteoporosis without current pathological fracture: Secondary | ICD-10-CM | POA: Diagnosis not present

## 2019-11-28 DIAGNOSIS — J9610 Chronic respiratory failure, unspecified whether with hypoxia or hypercapnia: Secondary | ICD-10-CM | POA: Diagnosis not present

## 2019-11-28 DIAGNOSIS — N189 Chronic kidney disease, unspecified: Secondary | ICD-10-CM | POA: Diagnosis not present

## 2019-11-28 DIAGNOSIS — J441 Chronic obstructive pulmonary disease with (acute) exacerbation: Secondary | ICD-10-CM | POA: Diagnosis not present

## 2019-11-28 DIAGNOSIS — I130 Hypertensive heart and chronic kidney disease with heart failure and stage 1 through stage 4 chronic kidney disease, or unspecified chronic kidney disease: Secondary | ICD-10-CM | POA: Diagnosis not present

## 2019-11-28 DIAGNOSIS — I8310 Varicose veins of unspecified lower extremity with inflammation: Secondary | ICD-10-CM | POA: Diagnosis not present

## 2019-11-28 IMAGING — CT CT ABD-PELV W/ CM
2 of 5 series · 16 of 46 positions shown, 18 images · IV contrast (Omni 300)
Comparison: 08/04/2017, 06/23/2016, 03/09/2015, 06/17/2013 CT
abdomen and pelvis

CLINICAL DATA: 89 y/o F; upper abdominal and upper back pain with
onset this morning.

EXAM:
CT ABDOMEN AND PELVIS WITH CONTRAST
TECHNIQUE: Multidetector CT imaging of the abdomen and pelvis was performed
using the standard protocol following bolus administration of
intravenous contrast.
CONTRAST:  100mL OMNIPAQUE IOHEXOL 300 MG/ML  SOLN

[Series 3: a/p w/ 5mm · axial · 0.71mm/px · z∈[-709,-344]mm · 13 of 83 slices shown, 15 images]
[im 5/83  soft-tissue]
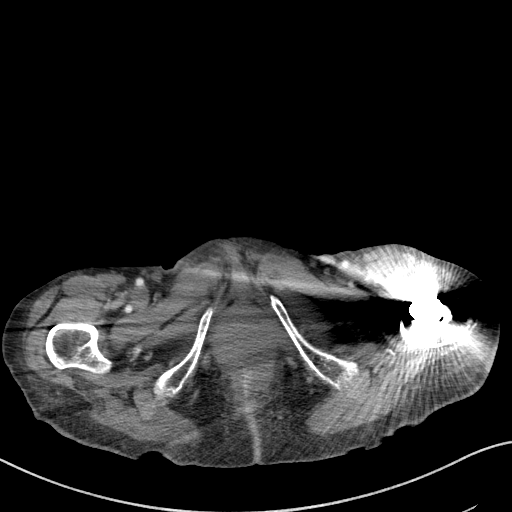
[im 5/83  bone]
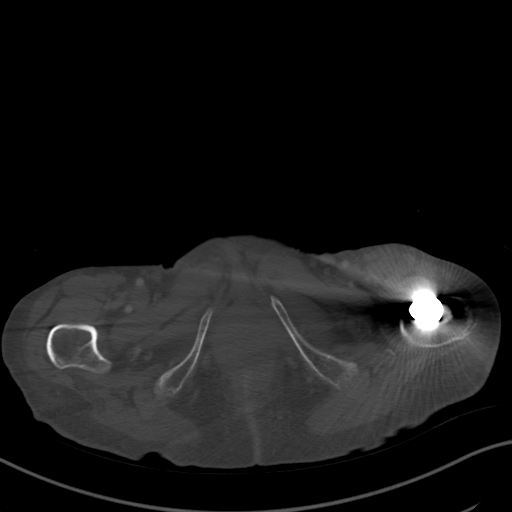
[im 10/83  soft-tissue]
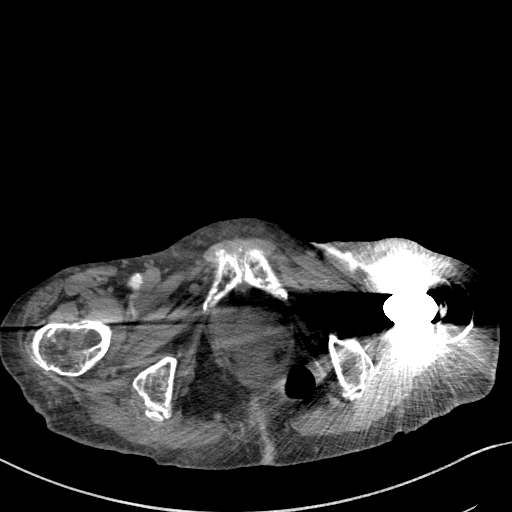
[im 19/83  soft-tissue]
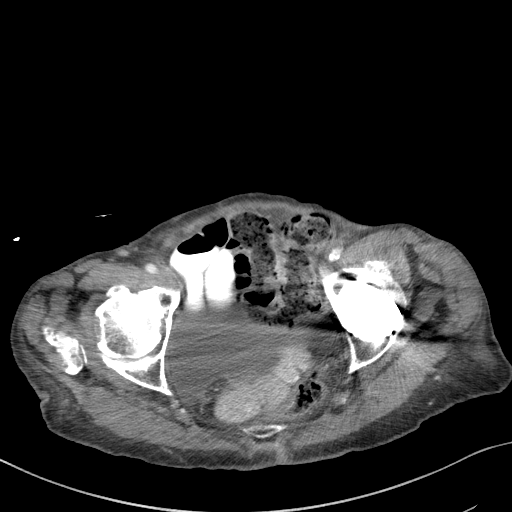
[im 23/83  soft-tissue]
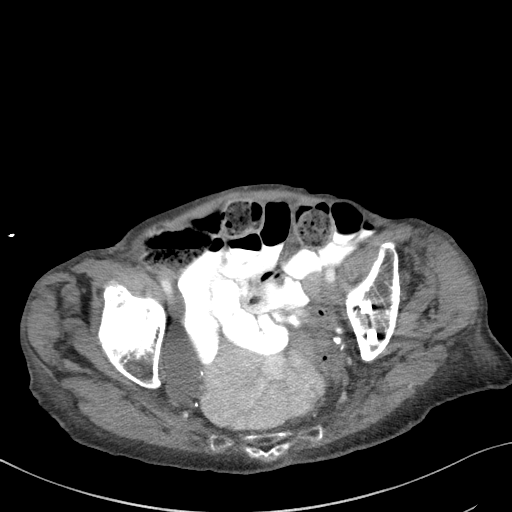
[im 28/83  soft-tissue]
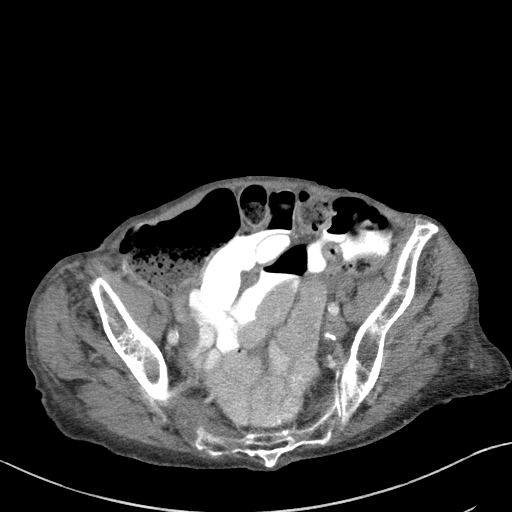
[im 37/83  soft-tissue]
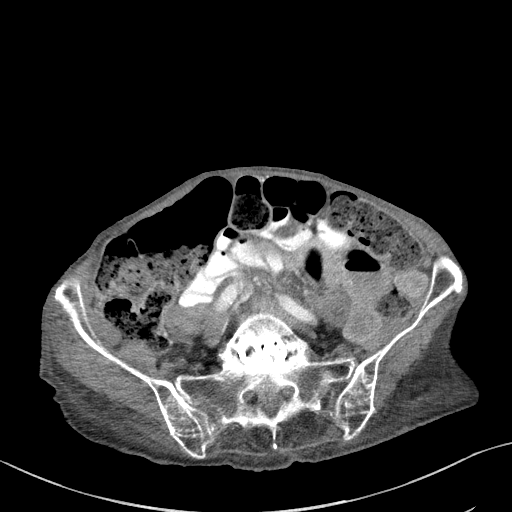
[im 42/83  soft-tissue]
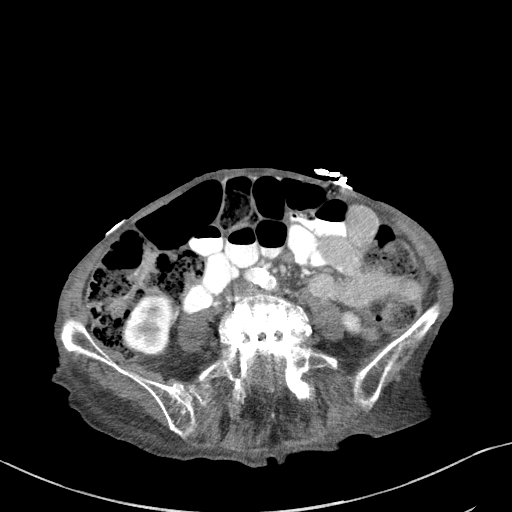
[im 46/83  soft-tissue]
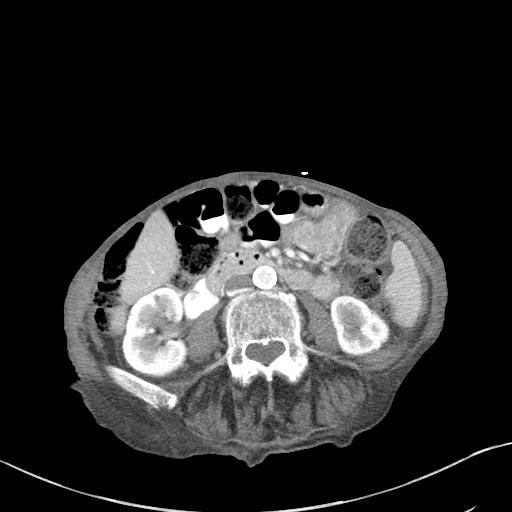
[im 55/83  soft-tissue]
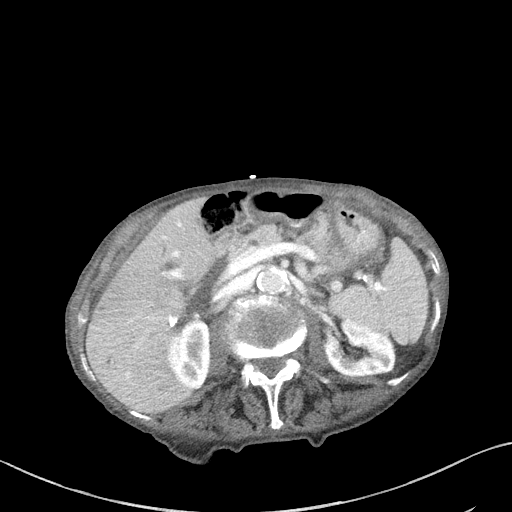
[im 55/83  bone]
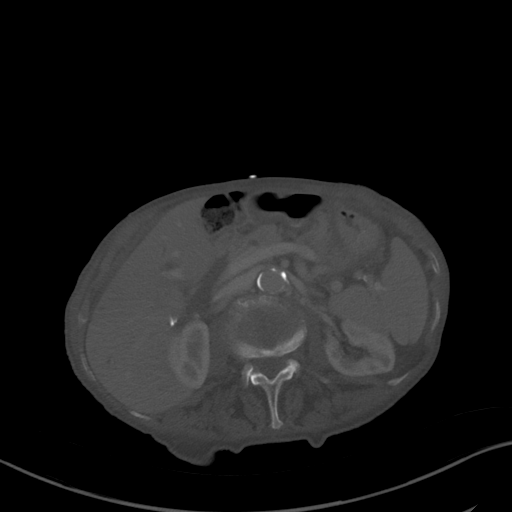
[im 60/83  soft-tissue]
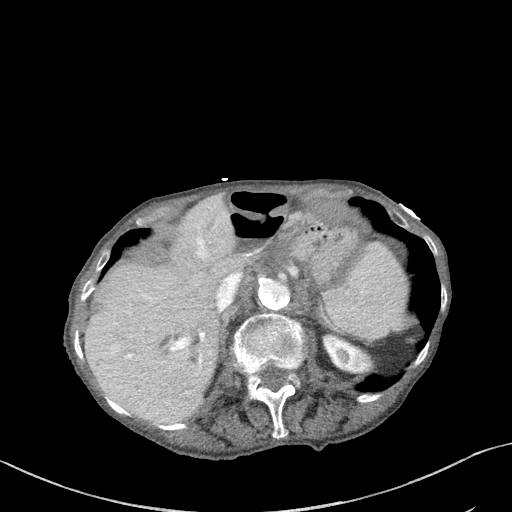
[im 64/83  soft-tissue]
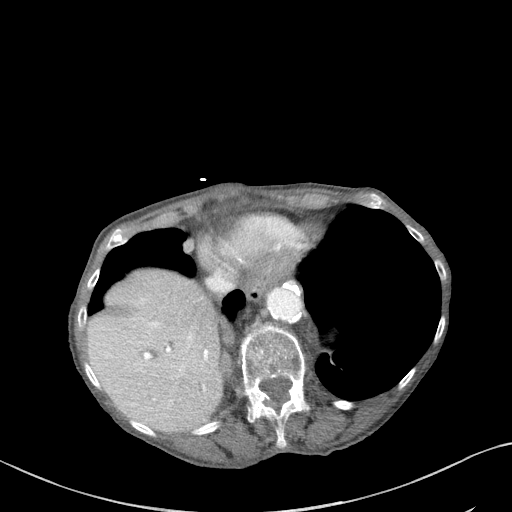
[im 73/83  soft-tissue]
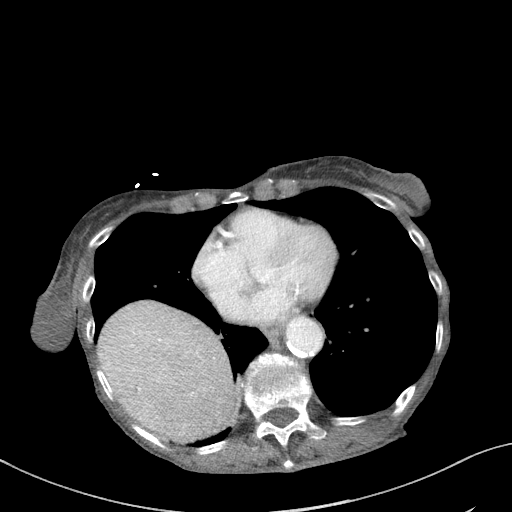
[im 78/83  soft-tissue]
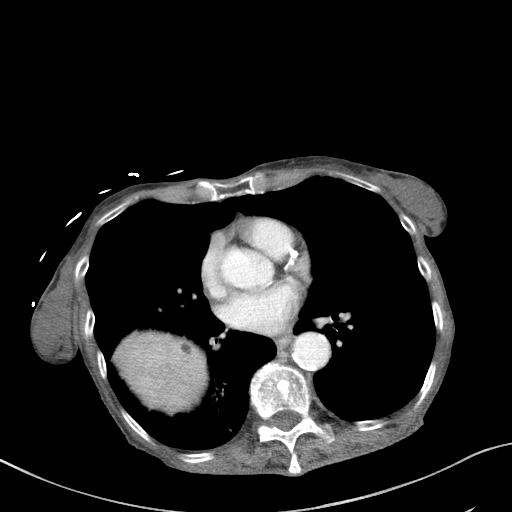

[Series 6: a/p w/ cor · coronal · 0.66mm/px · 3 of 114 slices shown]
[im 38/114  soft-tissue]
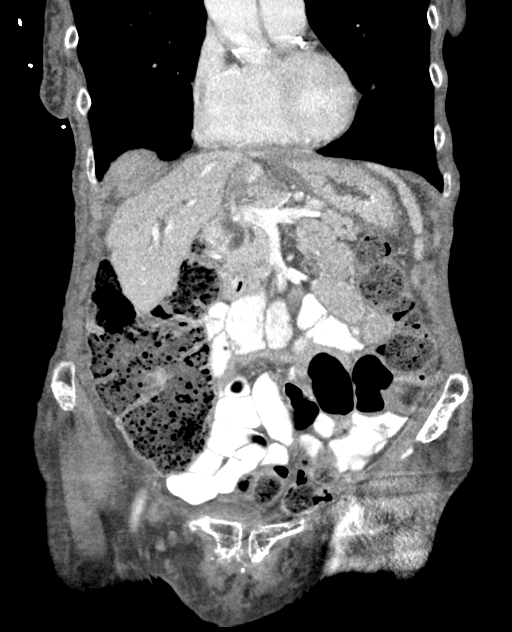
[im 51/114  soft-tissue]
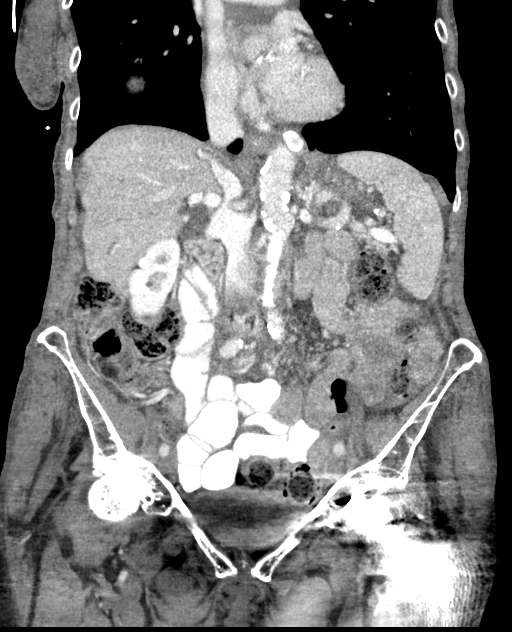
[im 63/114  soft-tissue]
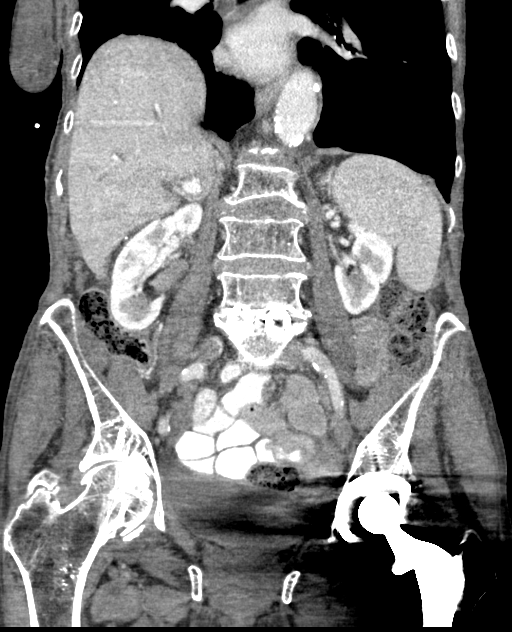

[16 of 46 positions shown; findings below may reference images not displayed]

FINDINGS: Lower chest: No acute abnormality.

Hepatobiliary: Stable 7 mm cyst within the dome of right liver. No
additional focal liver abnormality is seen. Status post
cholecystectomy. Stable mild intrahepatic and extrahepatic biliary
ductal dilatation is likely compensatory post cholecystectomy..

Pancreas: Unremarkable. No pancreatic ductal dilatation or
surrounding inflammatory changes.

Spleen: Normal in size without focal abnormality.

Adrenals/Urinary Tract: Normal adrenal glands. Stable scattered
subcentimeter cysts bilaterally. No urinary stone disease or
hydronephrosis. Normal bladder.

Stomach/Bowel: Stomach is within normal limits. Appendix not
identified, no pericecal inflammation. No evidence of bowel wall
thickening, distention, or inflammatory changes.

Vascular/Lymphatic: Aortic atherosclerosis. No enlarged abdominal or
pelvic lymph nodes.

Reproductive: Status post hysterectomy. No adnexal masses.

Other: No abdominal wall hernia or abnormality. No abdominopelvic
ascites.

Musculoskeletal: Chronic T11 and L2 compression deformity. Total
left hip arthroplasty, partially visualized. Chronic L4-5 and L5-S1
interbody fusion with laminectomy. Osteoarthrosis of the right hip
joint with loss of joint space and periarticular osteophytosis.
Bones are diffusely demineralized.
IMPRESSION: 1. No acute process identified as explanation for abdominal pain.
2. Stable chronic findings as above.

By: Algo Pla M.D.

## 2019-11-30 DIAGNOSIS — J441 Chronic obstructive pulmonary disease with (acute) exacerbation: Secondary | ICD-10-CM | POA: Diagnosis not present

## 2019-11-30 DIAGNOSIS — M5416 Radiculopathy, lumbar region: Secondary | ICD-10-CM | POA: Diagnosis not present

## 2019-11-30 DIAGNOSIS — N189 Chronic kidney disease, unspecified: Secondary | ICD-10-CM | POA: Diagnosis not present

## 2019-11-30 DIAGNOSIS — I130 Hypertensive heart and chronic kidney disease with heart failure and stage 1 through stage 4 chronic kidney disease, or unspecified chronic kidney disease: Secondary | ICD-10-CM | POA: Diagnosis not present

## 2019-11-30 DIAGNOSIS — M810 Age-related osteoporosis without current pathological fracture: Secondary | ICD-10-CM | POA: Diagnosis not present

## 2019-11-30 DIAGNOSIS — I480 Paroxysmal atrial fibrillation: Secondary | ICD-10-CM | POA: Diagnosis not present

## 2019-11-30 DIAGNOSIS — J9610 Chronic respiratory failure, unspecified whether with hypoxia or hypercapnia: Secondary | ICD-10-CM | POA: Diagnosis not present

## 2019-11-30 DIAGNOSIS — I5032 Chronic diastolic (congestive) heart failure: Secondary | ICD-10-CM | POA: Diagnosis not present

## 2019-11-30 DIAGNOSIS — I8310 Varicose veins of unspecified lower extremity with inflammation: Secondary | ICD-10-CM | POA: Diagnosis not present

## 2019-12-05 DIAGNOSIS — M5416 Radiculopathy, lumbar region: Secondary | ICD-10-CM | POA: Diagnosis not present

## 2019-12-05 DIAGNOSIS — J9610 Chronic respiratory failure, unspecified whether with hypoxia or hypercapnia: Secondary | ICD-10-CM | POA: Diagnosis not present

## 2019-12-05 DIAGNOSIS — N189 Chronic kidney disease, unspecified: Secondary | ICD-10-CM | POA: Diagnosis not present

## 2019-12-05 DIAGNOSIS — M810 Age-related osteoporosis without current pathological fracture: Secondary | ICD-10-CM | POA: Diagnosis not present

## 2019-12-05 DIAGNOSIS — I130 Hypertensive heart and chronic kidney disease with heart failure and stage 1 through stage 4 chronic kidney disease, or unspecified chronic kidney disease: Secondary | ICD-10-CM | POA: Diagnosis not present

## 2019-12-05 DIAGNOSIS — I5032 Chronic diastolic (congestive) heart failure: Secondary | ICD-10-CM | POA: Diagnosis not present

## 2019-12-05 DIAGNOSIS — I480 Paroxysmal atrial fibrillation: Secondary | ICD-10-CM | POA: Diagnosis not present

## 2019-12-05 DIAGNOSIS — J441 Chronic obstructive pulmonary disease with (acute) exacerbation: Secondary | ICD-10-CM | POA: Diagnosis not present

## 2019-12-05 DIAGNOSIS — I8310 Varicose veins of unspecified lower extremity with inflammation: Secondary | ICD-10-CM | POA: Diagnosis not present

## 2019-12-08 DIAGNOSIS — I130 Hypertensive heart and chronic kidney disease with heart failure and stage 1 through stage 4 chronic kidney disease, or unspecified chronic kidney disease: Secondary | ICD-10-CM | POA: Diagnosis not present

## 2019-12-08 DIAGNOSIS — I5032 Chronic diastolic (congestive) heart failure: Secondary | ICD-10-CM | POA: Diagnosis not present

## 2019-12-08 DIAGNOSIS — I8310 Varicose veins of unspecified lower extremity with inflammation: Secondary | ICD-10-CM | POA: Diagnosis not present

## 2019-12-08 DIAGNOSIS — J9610 Chronic respiratory failure, unspecified whether with hypoxia or hypercapnia: Secondary | ICD-10-CM | POA: Diagnosis not present

## 2019-12-08 DIAGNOSIS — M810 Age-related osteoporosis without current pathological fracture: Secondary | ICD-10-CM | POA: Diagnosis not present

## 2019-12-08 DIAGNOSIS — J441 Chronic obstructive pulmonary disease with (acute) exacerbation: Secondary | ICD-10-CM | POA: Diagnosis not present

## 2019-12-08 DIAGNOSIS — I480 Paroxysmal atrial fibrillation: Secondary | ICD-10-CM | POA: Diagnosis not present

## 2019-12-08 DIAGNOSIS — M5416 Radiculopathy, lumbar region: Secondary | ICD-10-CM | POA: Diagnosis not present

## 2019-12-08 DIAGNOSIS — N189 Chronic kidney disease, unspecified: Secondary | ICD-10-CM | POA: Diagnosis not present

## 2019-12-09 DIAGNOSIS — I509 Heart failure, unspecified: Secondary | ICD-10-CM | POA: Diagnosis not present

## 2019-12-09 DIAGNOSIS — Z9981 Dependence on supplemental oxygen: Secondary | ICD-10-CM | POA: Diagnosis not present

## 2019-12-09 DIAGNOSIS — I110 Hypertensive heart disease with heart failure: Secondary | ICD-10-CM | POA: Diagnosis not present

## 2019-12-09 DIAGNOSIS — R600 Localized edema: Secondary | ICD-10-CM | POA: Diagnosis not present

## 2019-12-09 DIAGNOSIS — I480 Paroxysmal atrial fibrillation: Secondary | ICD-10-CM | POA: Diagnosis not present

## 2019-12-09 DIAGNOSIS — I5032 Chronic diastolic (congestive) heart failure: Secondary | ICD-10-CM | POA: Diagnosis not present

## 2019-12-09 DIAGNOSIS — R0600 Dyspnea, unspecified: Secondary | ICD-10-CM | POA: Diagnosis not present

## 2019-12-12 DIAGNOSIS — I130 Hypertensive heart and chronic kidney disease with heart failure and stage 1 through stage 4 chronic kidney disease, or unspecified chronic kidney disease: Secondary | ICD-10-CM | POA: Diagnosis not present

## 2019-12-12 DIAGNOSIS — I8310 Varicose veins of unspecified lower extremity with inflammation: Secondary | ICD-10-CM | POA: Diagnosis not present

## 2019-12-12 DIAGNOSIS — I480 Paroxysmal atrial fibrillation: Secondary | ICD-10-CM | POA: Diagnosis not present

## 2019-12-12 DIAGNOSIS — J9610 Chronic respiratory failure, unspecified whether with hypoxia or hypercapnia: Secondary | ICD-10-CM | POA: Diagnosis not present

## 2019-12-12 DIAGNOSIS — N189 Chronic kidney disease, unspecified: Secondary | ICD-10-CM | POA: Diagnosis not present

## 2019-12-12 DIAGNOSIS — I5032 Chronic diastolic (congestive) heart failure: Secondary | ICD-10-CM | POA: Diagnosis not present

## 2019-12-12 DIAGNOSIS — J441 Chronic obstructive pulmonary disease with (acute) exacerbation: Secondary | ICD-10-CM | POA: Diagnosis not present

## 2019-12-12 DIAGNOSIS — M810 Age-related osteoporosis without current pathological fracture: Secondary | ICD-10-CM | POA: Diagnosis not present

## 2019-12-12 DIAGNOSIS — M5416 Radiculopathy, lumbar region: Secondary | ICD-10-CM | POA: Diagnosis not present

## 2019-12-13 ENCOUNTER — Telehealth: Payer: Self-pay | Admitting: Internal Medicine

## 2019-12-13 DIAGNOSIS — I509 Heart failure, unspecified: Secondary | ICD-10-CM | POA: Diagnosis not present

## 2019-12-13 DIAGNOSIS — J449 Chronic obstructive pulmonary disease, unspecified: Secondary | ICD-10-CM | POA: Diagnosis not present

## 2019-12-13 DIAGNOSIS — R600 Localized edema: Secondary | ICD-10-CM | POA: Diagnosis not present

## 2019-12-13 DIAGNOSIS — I5032 Chronic diastolic (congestive) heart failure: Secondary | ICD-10-CM | POA: Diagnosis not present

## 2019-12-13 DIAGNOSIS — I110 Hypertensive heart disease with heart failure: Secondary | ICD-10-CM | POA: Diagnosis not present

## 2019-12-13 NOTE — Telephone Encounter (Signed)
Patient's daughter is returning call. She states she would prefer to have assistance application mailed to the patient's home address. Confirmed home address:  [address] [place] [address]  Patient's daughter is requesting a call back to verify that application has been mailed.

## 2019-12-13 NOTE — Telephone Encounter (Signed)
Received MD portion of Eliquis patient assistance application. This has been completed. Left message for patient to call back to advise if she wants me to mail to her, for her to pick up, or if I should go on and fax this portion to [employer].

## 2019-12-13 NOTE — Telephone Encounter (Signed)
Application mailed.  Patient notified.

## 2019-12-14 DIAGNOSIS — I8310 Varicose veins of unspecified lower extremity with inflammation: Secondary | ICD-10-CM | POA: Diagnosis not present

## 2019-12-14 DIAGNOSIS — N189 Chronic kidney disease, unspecified: Secondary | ICD-10-CM | POA: Diagnosis not present

## 2019-12-14 DIAGNOSIS — J9610 Chronic respiratory failure, unspecified whether with hypoxia or hypercapnia: Secondary | ICD-10-CM | POA: Diagnosis not present

## 2019-12-14 DIAGNOSIS — M810 Age-related osteoporosis without current pathological fracture: Secondary | ICD-10-CM | POA: Diagnosis not present

## 2019-12-14 DIAGNOSIS — I130 Hypertensive heart and chronic kidney disease with heart failure and stage 1 through stage 4 chronic kidney disease, or unspecified chronic kidney disease: Secondary | ICD-10-CM | POA: Diagnosis not present

## 2019-12-14 DIAGNOSIS — I5032 Chronic diastolic (congestive) heart failure: Secondary | ICD-10-CM | POA: Diagnosis not present

## 2019-12-14 DIAGNOSIS — I480 Paroxysmal atrial fibrillation: Secondary | ICD-10-CM | POA: Diagnosis not present

## 2019-12-14 DIAGNOSIS — J441 Chronic obstructive pulmonary disease with (acute) exacerbation: Secondary | ICD-10-CM | POA: Diagnosis not present

## 2019-12-14 DIAGNOSIS — M5416 Radiculopathy, lumbar region: Secondary | ICD-10-CM | POA: Diagnosis not present

## 2019-12-15 DIAGNOSIS — J449 Chronic obstructive pulmonary disease, unspecified: Secondary | ICD-10-CM | POA: Diagnosis not present

## 2019-12-19 DIAGNOSIS — M5416 Radiculopathy, lumbar region: Secondary | ICD-10-CM | POA: Diagnosis not present

## 2019-12-19 DIAGNOSIS — N189 Chronic kidney disease, unspecified: Secondary | ICD-10-CM | POA: Diagnosis not present

## 2019-12-19 DIAGNOSIS — I130 Hypertensive heart and chronic kidney disease with heart failure and stage 1 through stage 4 chronic kidney disease, or unspecified chronic kidney disease: Secondary | ICD-10-CM | POA: Diagnosis not present

## 2019-12-19 DIAGNOSIS — M810 Age-related osteoporosis without current pathological fracture: Secondary | ICD-10-CM | POA: Diagnosis not present

## 2019-12-19 DIAGNOSIS — I8310 Varicose veins of unspecified lower extremity with inflammation: Secondary | ICD-10-CM | POA: Diagnosis not present

## 2019-12-19 DIAGNOSIS — I5032 Chronic diastolic (congestive) heart failure: Secondary | ICD-10-CM | POA: Diagnosis not present

## 2019-12-19 DIAGNOSIS — I480 Paroxysmal atrial fibrillation: Secondary | ICD-10-CM | POA: Diagnosis not present

## 2019-12-19 DIAGNOSIS — J9610 Chronic respiratory failure, unspecified whether with hypoxia or hypercapnia: Secondary | ICD-10-CM | POA: Diagnosis not present

## 2019-12-19 DIAGNOSIS — J441 Chronic obstructive pulmonary disease with (acute) exacerbation: Secondary | ICD-10-CM | POA: Diagnosis not present

## 2019-12-27 DIAGNOSIS — I130 Hypertensive heart and chronic kidney disease with heart failure and stage 1 through stage 4 chronic kidney disease, or unspecified chronic kidney disease: Secondary | ICD-10-CM | POA: Diagnosis not present

## 2019-12-27 DIAGNOSIS — N189 Chronic kidney disease, unspecified: Secondary | ICD-10-CM | POA: Diagnosis not present

## 2019-12-27 DIAGNOSIS — I480 Paroxysmal atrial fibrillation: Secondary | ICD-10-CM | POA: Diagnosis not present

## 2019-12-27 DIAGNOSIS — I5032 Chronic diastolic (congestive) heart failure: Secondary | ICD-10-CM | POA: Diagnosis not present

## 2019-12-27 DIAGNOSIS — J9610 Chronic respiratory failure, unspecified whether with hypoxia or hypercapnia: Secondary | ICD-10-CM | POA: Diagnosis not present

## 2019-12-27 DIAGNOSIS — I8310 Varicose veins of unspecified lower extremity with inflammation: Secondary | ICD-10-CM | POA: Diagnosis not present

## 2019-12-27 DIAGNOSIS — M810 Age-related osteoporosis without current pathological fracture: Secondary | ICD-10-CM | POA: Diagnosis not present

## 2019-12-27 DIAGNOSIS — M5416 Radiculopathy, lumbar region: Secondary | ICD-10-CM | POA: Diagnosis not present

## 2019-12-27 DIAGNOSIS — J441 Chronic obstructive pulmonary disease with (acute) exacerbation: Secondary | ICD-10-CM | POA: Diagnosis not present

## 2019-12-28 DIAGNOSIS — Z9981 Dependence on supplemental oxygen: Secondary | ICD-10-CM | POA: Diagnosis not present

## 2019-12-28 DIAGNOSIS — R0902 Hypoxemia: Secondary | ICD-10-CM | POA: Diagnosis not present

## 2019-12-28 DIAGNOSIS — Z20822 Contact with and (suspected) exposure to covid-19: Secondary | ICD-10-CM | POA: Diagnosis not present

## 2019-12-28 DIAGNOSIS — J449 Chronic obstructive pulmonary disease, unspecified: Secondary | ICD-10-CM | POA: Diagnosis not present

## 2020-01-03 ENCOUNTER — Other Ambulatory Visit: Payer: Self-pay

## 2020-01-03 ENCOUNTER — Ambulatory Visit: Payer: Medicare HMO | Admitting: Podiatry

## 2020-01-03 ENCOUNTER — Encounter: Payer: Self-pay | Admitting: Podiatry

## 2020-01-03 DIAGNOSIS — J9610 Chronic respiratory failure, unspecified whether with hypoxia or hypercapnia: Secondary | ICD-10-CM | POA: Diagnosis not present

## 2020-01-03 DIAGNOSIS — M5416 Radiculopathy, lumbar region: Secondary | ICD-10-CM | POA: Diagnosis not present

## 2020-01-03 DIAGNOSIS — I8310 Varicose veins of unspecified lower extremity with inflammation: Secondary | ICD-10-CM | POA: Diagnosis not present

## 2020-01-03 DIAGNOSIS — I130 Hypertensive heart and chronic kidney disease with heart failure and stage 1 through stage 4 chronic kidney disease, or unspecified chronic kidney disease: Secondary | ICD-10-CM | POA: Diagnosis not present

## 2020-01-03 DIAGNOSIS — J441 Chronic obstructive pulmonary disease with (acute) exacerbation: Secondary | ICD-10-CM | POA: Diagnosis not present

## 2020-01-03 DIAGNOSIS — I5032 Chronic diastolic (congestive) heart failure: Secondary | ICD-10-CM | POA: Diagnosis not present

## 2020-01-03 DIAGNOSIS — N189 Chronic kidney disease, unspecified: Secondary | ICD-10-CM | POA: Diagnosis not present

## 2020-01-03 DIAGNOSIS — I480 Paroxysmal atrial fibrillation: Secondary | ICD-10-CM | POA: Diagnosis not present

## 2020-01-03 DIAGNOSIS — B351 Tinea unguium: Secondary | ICD-10-CM

## 2020-01-03 DIAGNOSIS — M79675 Pain in left toe(s): Secondary | ICD-10-CM

## 2020-01-03 DIAGNOSIS — M79674 Pain in right toe(s): Secondary | ICD-10-CM

## 2020-01-03 DIAGNOSIS — M810 Age-related osteoporosis without current pathological fracture: Secondary | ICD-10-CM | POA: Diagnosis not present

## 2020-01-04 NOTE — Telephone Encounter (Signed)
Per fax from [place], patient is approved for Eliquis assistance free of charge from 01/03/2020 - 03/30/2020

## 2020-01-07 NOTE — Progress Notes (Signed)
Subjective:  Patient ID: Heather [name], female    DOB: Jun 25, 1926,  MRN: [id number]  KERSTIE AGENT presents to clinic today for painful thick toenails that are difficult to trim. Pain interferes with ambulation. Aggravating factors include wearing enclosed shoe gear. Pain is relieved with periodic professional debridement..  86 y.o. female presents with the above complaint.    Review of Systems: Negative except as noted in the HPI. Past Medical History:  Diagnosis Date  . Anxiety   . Arthritis   . Atrial fibrillation ([place])   . Cerumen impaction   . COPD (chronic obstructive pulmonary disease) ([place])   . Dysrhythmia    AF  . Essential hypertension, benign   . Gallstones   . GERD (gastroesophageal reflux disease)   . H/O hiatal hernia   . History of nuclear stress test 10/2010   dipyridamole; normal pattern of perfusion; low risk, normal study  . 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 Mild aortic insufficiency   . Osteoporosis   . Peripheral neuropathy   . Pneumonia    states she had it twice, last time a couple of months ago  . PONV (postoperative nausea and vomiting)   . Shortness of breath    with exertion  . Unspecified hypothyroidism   . Venous insufficiency    Past Surgical History:  Procedure Laterality Date  . APPENDECTOMY  1935  . BACK SURGERY     x2  . Cardiometablic Testing  2/68/3419   submaximal effort with peak RER of 0.5, peak VO2 79% predicted; HR peak up to 78%; PVC was 55% predicted, PEV1 39% predicted; PEV1/VC ratio was reduced; normal vital capacity; DLCO was reduced to 63%  . CARPAL TUNNEL RELEASE    . CATARACT EXTRACTION W/ INTRAOCULAR LENS  IMPLANT, BILATERAL    . CHOLECYSTECTOMY  04/07/2014   dr toth  . CHOLECYSTECTOMY N/A 04/07/2014   Procedure: LAPAROSCOPIC CHOLECYSTECTOMY WITH INTRAOPERATIVE CHOLANGIOGRAM POSSBILE OPEN;  Surgeon: Autumn Messing III, MD;  Location: [facility];  Service: General;  Laterality: N/A;  .  COLON SURGERY  2008   partial  . ESOPHAGOGASTRODUODENOSCOPY (EGD) WITH PROPOFOL N/A 05/25/2017   Procedure: ESOPHAGOGASTRODUODENOSCOPY (EGD) WITH PROPOFOL;  Surgeon: Clarene Essex, MD;  Location: [facility];  Service: Endoscopy;  Laterality: N/A;  . HERNIA REPAIR    . JOINT REPLACEMENT    . left hip relacement  2000  . SAVORY DILATION N/A 05/25/2017   Procedure: SAVORY DILATION;  Surgeon: Clarene Essex, MD;  [facility] END[facility]COPY;  Service: Endoscopy;  Laterality: N/A;  . TONSILLECTOMY  1935  . TOTAL ABDOMINAL HYSTERECTOMY  1970  . TRANSTHORACIC ECHOCARDIOGRAM  05/2011   EF=>55%; mild conc LVH; mild mitral annular calcif; mild TR; AV mildly sclerotic & mild AR  . VENTRAL HERNIA REPAIR  09/19/2011   Procedure: LAPAROSCOPIC VENTRAL HERNIA;  Surgeon: Luella Cook III, MD;  Location: [facility];  Service: General;  Laterality: N/A;  laparoscopic ventral hernia repair with mesh    Current Outpatient Medications:  .  acetaminophen (TYLENOL) 325 MG tablet, Take 2 tablets (650 mg total) by mouth every 6 (six) hours as needed for mild pain or headache (fever >/= 101)., Disp: 20 tablet, Rfl: 0 .  acetaminophen (TYLENOL) 650 MG CR tablet, Take 1,300 mg by mouth daily as needed for pain., Disp: , Rfl:  .  albuterol (PROVENTIL) (2.5 MG/3ML) 0.083% nebulizer solution, Take 2.5 mg by nebulization every 6 (six) hours as needed for wheezing or shortness of breath., Disp: ,  Rfl:  .  albuterol (VENTOLIN HFA) 108 (90 Base) MCG/ACT inhaler, Inhale 2 puffs into the lungs every 6 (six) hours as needed for wheezing or shortness of breath., Disp: 18 g, Rfl: 5 .  budesonide-formoterol (SYMBICORT) 160-4.5 MCG/ACT inhaler, Inhale 2 puffs into the lungs 2 (two) times daily. , Disp: , Rfl:  .  calcium citrate-vitamin D (CITRACAL+D) 315-200 MG-UNIT per tablet, Take 1 tablet by mouth daily., Disp: , Rfl:  .  Carboxymethylcellulose Sodium (THERATEARS OP), Place 2 drops into both eyes daily. , Disp: , Rfl:  .  diltiazem (CARDIZEM CD)  180 MG 24 hr capsule, Take 180 mg by mouth every morning. , Disp: , Rfl:  .  ELIQUIS 2.5 MG TABS tablet, Take 1 tablet by mouth twice daily, Disp: 180 tablet, Rfl: 0 .  feeding supplement, ENSURE ENLIVE, (ENSURE ENLIVE) LIQD, Take 237 mLs by mouth 2 (two) times daily between meals., Disp: 237 mL, Rfl: 12 .  fluticasone (FLONASE) 50 MCG/ACT nasal spray, Place 2 sprays into both nostrils at bedtime. , Disp: , Rfl:  .  guaiFENesin-dextromethorphan (ROBITUSSIN DM) 100-10 MG/5ML syrup, Take 5 mLs by mouth every 6 (six) hours as needed for cough., Disp: 118 mL, Rfl: 0 .  Lactobacillus Reuteri (BIOGAIA PROBIOTIC PO), Take 1 capsule by mouth daily., Disp: , Rfl:  .  levothyroxine (SYNTHROID, LEVOTHROID) 137 MCG tablet, Take 137 mcg by mouth daily before breakfast. , Disp: , Rfl:  .  Multiple Vitamins-Minerals (MULTIVITAMIN WITH MINERALS) tablet, Take 1 tablet by mouth daily., Disp: , Rfl:  .  polyethylene glycol (MIRALAX / GLYCOLAX) packet, Take 17 g by mouth every other day. , Disp: , Rfl:  .  Teriparatide, Recombinant, (FORTEO) 620 MCG/2.48ML SOPN, Inject 20 mcg into the skin daily before breakfast. , Disp: , Rfl:  .  doxycycline (VIBRAMYCIN) 100 MG capsule, , Disp: , Rfl:  .  furosemide (LASIX) 40 MG tablet, , Disp: , Rfl:  .  KLOR-CON M20 20 MEQ tablet, , Disp: , Rfl:  .  losartan (COZAAR) 25 MG tablet, , Disp: , Rfl:  .  predniSONE (DELTASONE) 10 MG tablet, , Disp: , Rfl:  Allergies  Allergen Reactions  . Ace Inhibitors Cough  . Sulfur Nausea And Vomiting   Social History   Occupational History  . Occupation: Retired Astronomer  Tobacco Use  . Smoking status: Former Smoker    Packs/day: 1.00    Years: 20.00    Pack years: 20.00    Types: Cigarettes    Start date: 69    Quit date: 09/12/1978    Years since quitting: 41.3  . Smokeless tobacco: Never Used  Vaping Use  . Vaping Use: Never used  Substance and Sexual Activity  . Alcohol use: Not Currently    Alcohol/week: 0.0 standard  drinks    Comment: occ  . Drug use: No  . Sexual activity: Yes    Objective:   Constitutional ALIAH ERIKSSON is a pleasant 86 y.o. Caucasian female, WD, WN in NAD.Marland Kitchen AAO x 3.   Vascular Capillary fill time to digits <3 seconds b/l lower extremities. Faintly palpable pedal pulses b/l. Pedal hair sparse. Lower extremity skin temperature gradient within normal limits. No pain with calf compression b/l.  No cyanosis or clubbing noted.  Neurologic Normal speech. Oriented to person, place, and time. Epicritic sensation to light touch grossly present bilaterally. Protective sensation intact 5/5 intact bilaterally with 10g monofilament b/l. Vibratory sensation intact b/l.  Dermatologic Pedal skin with normal turgor, texture and  tone bilaterally. No open wounds bilaterally. No interdigital macerations bilaterally. Toenails 2-5 bilaterally elongated, discolored, dystrophic, thickened, and crumbly with subungual debris and tenderness to dorsal palpation. Anonychia noted L hallux and R hallux. Nailbed(s) epithelialized.   Orthopedic: Normal muscle strength 5/5 to all lower extremity muscle groups bilaterally. No pain crepitus or joint limitation noted with ROM b/l. No gross bony deformities bilaterally.   Radiographs: None Assessment:   1. Pain due to onychomycosis of toenails of both feet    Plan:  Patient was evaluated and treated and all questions answered.  Onychomycosis with pain -Nails palliatively debridement as below -Educated on self-care  Procedure: Nail Debridement Rationale: Pain Type of Debridement: manual, sharp debridement. Instrumentation: Nail nipper, rotary burr. Number of Nails: 8 -Examined patient. -No new findings. No new orders. -Toenails 2-5 bilaterally debrided in length and girth without iatrogenic bleeding with sterile nail nipper and dremel.  -Patient to report any pedal injuries to medical professional immediately. -Patient to continue soft, supportive shoe gear  daily. -Patient/POA to call should there be question/concern in the interim.  Return in about 3 months (around 04/04/2020).  Marzetta Board, DPM

## 2020-01-09 DIAGNOSIS — I130 Hypertensive heart and chronic kidney disease with heart failure and stage 1 through stage 4 chronic kidney disease, or unspecified chronic kidney disease: Secondary | ICD-10-CM | POA: Diagnosis not present

## 2020-01-09 DIAGNOSIS — M810 Age-related osteoporosis without current pathological fracture: Secondary | ICD-10-CM | POA: Diagnosis not present

## 2020-01-09 DIAGNOSIS — M5416 Radiculopathy, lumbar region: Secondary | ICD-10-CM | POA: Diagnosis not present

## 2020-01-09 DIAGNOSIS — J9610 Chronic respiratory failure, unspecified whether with hypoxia or hypercapnia: Secondary | ICD-10-CM | POA: Diagnosis not present

## 2020-01-09 DIAGNOSIS — I8310 Varicose veins of unspecified lower extremity with inflammation: Secondary | ICD-10-CM | POA: Diagnosis not present

## 2020-01-09 DIAGNOSIS — N189 Chronic kidney disease, unspecified: Secondary | ICD-10-CM | POA: Diagnosis not present

## 2020-01-09 DIAGNOSIS — J441 Chronic obstructive pulmonary disease with (acute) exacerbation: Secondary | ICD-10-CM | POA: Diagnosis not present

## 2020-01-09 DIAGNOSIS — I480 Paroxysmal atrial fibrillation: Secondary | ICD-10-CM | POA: Diagnosis not present

## 2020-01-09 DIAGNOSIS — I5032 Chronic diastolic (congestive) heart failure: Secondary | ICD-10-CM | POA: Diagnosis not present

## 2020-01-12 DIAGNOSIS — J449 Chronic obstructive pulmonary disease, unspecified: Secondary | ICD-10-CM | POA: Diagnosis not present

## 2020-01-13 DIAGNOSIS — N189 Chronic kidney disease, unspecified: Secondary | ICD-10-CM | POA: Diagnosis not present

## 2020-01-13 DIAGNOSIS — I8310 Varicose veins of unspecified lower extremity with inflammation: Secondary | ICD-10-CM | POA: Diagnosis not present

## 2020-01-13 DIAGNOSIS — I5032 Chronic diastolic (congestive) heart failure: Secondary | ICD-10-CM | POA: Diagnosis not present

## 2020-01-13 DIAGNOSIS — M5416 Radiculopathy, lumbar region: Secondary | ICD-10-CM | POA: Diagnosis not present

## 2020-01-13 DIAGNOSIS — I130 Hypertensive heart and chronic kidney disease with heart failure and stage 1 through stage 4 chronic kidney disease, or unspecified chronic kidney disease: Secondary | ICD-10-CM | POA: Diagnosis not present

## 2020-01-13 DIAGNOSIS — J441 Chronic obstructive pulmonary disease with (acute) exacerbation: Secondary | ICD-10-CM | POA: Diagnosis not present

## 2020-01-13 DIAGNOSIS — I480 Paroxysmal atrial fibrillation: Secondary | ICD-10-CM | POA: Diagnosis not present

## 2020-01-13 DIAGNOSIS — J9610 Chronic respiratory failure, unspecified whether with hypoxia or hypercapnia: Secondary | ICD-10-CM | POA: Diagnosis not present

## 2020-01-13 DIAGNOSIS — M810 Age-related osteoporosis without current pathological fracture: Secondary | ICD-10-CM | POA: Diagnosis not present

## 2020-01-17 ENCOUNTER — Telehealth: Payer: Self-pay | Admitting: Emergency Medicine

## 2020-01-17 NOTE — Telephone Encounter (Signed)
Estill Bamberg - have you gotten any paperwork on this pt?

## 2020-01-17 NOTE — Telephone Encounter (Signed)
Application was located and signed by Dr. Lamonte Sakai. Application has been faxed to [facility] and pt was called and notified of this. Pt stated understanding. Original application is stored in Dr. Agustina Caroli box. Nothing further needed at this time.

## 2020-01-20 DIAGNOSIS — M5416 Radiculopathy, lumbar region: Secondary | ICD-10-CM | POA: Diagnosis not present

## 2020-01-20 DIAGNOSIS — I130 Hypertensive heart and chronic kidney disease with heart failure and stage 1 through stage 4 chronic kidney disease, or unspecified chronic kidney disease: Secondary | ICD-10-CM | POA: Diagnosis not present

## 2020-01-20 DIAGNOSIS — I5032 Chronic diastolic (congestive) heart failure: Secondary | ICD-10-CM | POA: Diagnosis not present

## 2020-01-20 DIAGNOSIS — N189 Chronic kidney disease, unspecified: Secondary | ICD-10-CM | POA: Diagnosis not present

## 2020-01-20 DIAGNOSIS — M810 Age-related osteoporosis without current pathological fracture: Secondary | ICD-10-CM | POA: Diagnosis not present

## 2020-01-20 DIAGNOSIS — J441 Chronic obstructive pulmonary disease with (acute) exacerbation: Secondary | ICD-10-CM | POA: Diagnosis not present

## 2020-01-20 DIAGNOSIS — I8310 Varicose veins of unspecified lower extremity with inflammation: Secondary | ICD-10-CM | POA: Diagnosis not present

## 2020-01-20 DIAGNOSIS — I480 Paroxysmal atrial fibrillation: Secondary | ICD-10-CM | POA: Diagnosis not present

## 2020-01-20 DIAGNOSIS — J9610 Chronic respiratory failure, unspecified whether with hypoxia or hypercapnia: Secondary | ICD-10-CM | POA: Diagnosis not present

## 2020-01-23 ENCOUNTER — Emergency Department (HOSPITAL_COMMUNITY): Payer: Medicare HMO

## 2020-01-23 ENCOUNTER — Encounter (HOSPITAL_COMMUNITY): Payer: Self-pay | Admitting: Emergency Medicine

## 2020-01-23 ENCOUNTER — Inpatient Hospital Stay (HOSPITAL_COMMUNITY)
Admission: EM | Admit: 2020-01-23 | Discharge: 2020-01-27 | DRG: 190 | Disposition: A | Discharge status: Home Health | Payer: Medicare HMO | Attending: Internal Medicine | Admitting: Internal Medicine

## 2020-01-23 ENCOUNTER — Other Ambulatory Visit: Payer: Self-pay

## 2020-01-23 DIAGNOSIS — Z1624 Resistance to multiple antibiotics: Secondary | ICD-10-CM | POA: Diagnosis present

## 2020-01-23 DIAGNOSIS — Z8616 Personal history of COVID-19: Secondary | ICD-10-CM

## 2020-01-23 DIAGNOSIS — I480 Paroxysmal atrial fibrillation: Secondary | ICD-10-CM | POA: Diagnosis present

## 2020-01-23 DIAGNOSIS — Z90710 Acquired absence of both cervix and uterus: Secondary | ICD-10-CM

## 2020-01-23 DIAGNOSIS — Z23 Encounter for immunization: Secondary | ICD-10-CM

## 2020-01-23 DIAGNOSIS — I110 Hypertensive heart disease with heart failure: Secondary | ICD-10-CM | POA: Diagnosis present

## 2020-01-23 DIAGNOSIS — K5900 Constipation, unspecified: Secondary | ICD-10-CM | POA: Diagnosis present

## 2020-01-23 DIAGNOSIS — J441 Chronic obstructive pulmonary disease with (acute) exacerbation: Secondary | ICD-10-CM | POA: Diagnosis not present

## 2020-01-23 DIAGNOSIS — Z79890 Hormone replacement therapy (postmenopausal): Secondary | ICD-10-CM

## 2020-01-23 DIAGNOSIS — R0602 Shortness of breath: Secondary | ICD-10-CM | POA: Diagnosis not present

## 2020-01-23 DIAGNOSIS — J439 Emphysema, unspecified: Secondary | ICD-10-CM | POA: Diagnosis not present

## 2020-01-23 DIAGNOSIS — M810 Age-related osteoporosis without current pathological fracture: Secondary | ICD-10-CM | POA: Diagnosis present

## 2020-01-23 DIAGNOSIS — J9621 Acute and chronic respiratory failure with hypoxia: Secondary | ICD-10-CM | POA: Diagnosis present

## 2020-01-23 DIAGNOSIS — Z87891 Personal history of nicotine dependence: Secondary | ICD-10-CM

## 2020-01-23 DIAGNOSIS — R000 Tachycardia, unspecified: Secondary | ICD-10-CM | POA: Diagnosis not present

## 2020-01-23 DIAGNOSIS — I5032 Chronic diastolic (congestive) heart failure: Secondary | ICD-10-CM | POA: Diagnosis present

## 2020-01-23 DIAGNOSIS — Z7951 Long term (current) use of inhaled steroids: Secondary | ICD-10-CM

## 2020-01-23 DIAGNOSIS — R52 Pain, unspecified: Secondary | ICD-10-CM | POA: Diagnosis not present

## 2020-01-23 DIAGNOSIS — R0689 Other abnormalities of breathing: Secondary | ICD-10-CM | POA: Diagnosis not present

## 2020-01-23 DIAGNOSIS — K219 Gastro-esophageal reflux disease without esophagitis: Secondary | ICD-10-CM | POA: Diagnosis present

## 2020-01-23 DIAGNOSIS — R059 Cough, unspecified: Secondary | ICD-10-CM | POA: Diagnosis not present

## 2020-01-23 DIAGNOSIS — Z7901 Long term (current) use of anticoagulants: Secondary | ICD-10-CM

## 2020-01-23 DIAGNOSIS — Z79899 Other long term (current) drug therapy: Secondary | ICD-10-CM

## 2020-01-23 DIAGNOSIS — E039 Hypothyroidism, unspecified: Secondary | ICD-10-CM | POA: Diagnosis present

## 2020-01-23 DIAGNOSIS — R4702 Dysphasia: Secondary | ICD-10-CM | POA: Diagnosis not present

## 2020-01-23 DIAGNOSIS — R0902 Hypoxemia: Secondary | ICD-10-CM | POA: Diagnosis not present

## 2020-01-23 LAB — CBC WITH DIFFERENTIAL/PLATELET
Abs Immature Granulocytes: 0.05 10*3/uL (ref 0.00–0.07)
Basophils Absolute: 0 10*3/uL (ref 0.0–0.1)
Basophils Relative: 0 %
Eosinophils Absolute: 0.2 10*3/uL (ref 0.0–0.5)
Eosinophils Relative: 2 %
HCT: 41.4 % (ref 36.0–46.0)
Hemoglobin: 13.6 g/dL (ref 12.0–15.0)
Immature Granulocytes: 1 %
Lymphocytes Relative: 17 %
Lymphs Abs: 1.7 10*3/uL (ref 0.7–4.0)
MCH: 31.5 pg (ref 26.0–34.0)
MCHC: 32.9 g/dL (ref 30.0–36.0)
MCV: 95.8 fL (ref 80.0–100.0)
Monocytes Absolute: 0.4 10*3/uL (ref 0.1–1.0)
Monocytes Relative: 4 %
Neutro Abs: 7.9 10*3/uL — ABNORMAL HIGH (ref 1.7–7.7)
Neutrophils Relative %: 76 %
Platelets: 240 10*3/uL (ref 150–400)
RBC: 4.32 MIL/uL (ref 3.87–5.11)
RDW: 13.9 % (ref 11.5–15.5)
WBC: 10.3 10*3/uL (ref 4.0–10.5)
nRBC: 0 % (ref 0.0–0.2)

## 2020-01-23 LAB — COMPREHENSIVE METABOLIC PANEL
ALT: 19 U/L (ref 0–44)
AST: 20 U/L (ref 15–41)
Albumin: 3.4 g/dL — ABNORMAL LOW (ref 3.5–5.0)
Alkaline Phosphatase: 68 U/L (ref 38–126)
Anion gap: 11 (ref 5–15)
BUN: 17 mg/dL (ref 8–23)
CO2: 29 mmol/L (ref 22–32)
Calcium: 9.1 mg/dL (ref 8.9–10.3)
Chloride: 99 mmol/L (ref 98–111)
Creatinine, Ser: 0.65 mg/dL (ref 0.44–1.00)
GFR, Estimated: >60 mL/min (ref >60)
Glucose, Bld: 132 mg/dL — ABNORMAL HIGH (ref 70–99)
Potassium: 3.7 mmol/L (ref 3.5–5.1)
Sodium: 139 mmol/L (ref 135–145)
Total Bilirubin: 1.8 mg/dL — ABNORMAL HIGH (ref 0.3–1.2)
Total Protein: 6 g/dL — ABNORMAL LOW (ref 6.5–8.1)

## 2020-01-23 LAB — PROTIME-INR
INR: 1.3 — ABNORMAL HIGH (ref 0.8–1.2)
Prothrombin Time: 15.5 seconds — ABNORMAL HIGH (ref 11.4–15.2)

## 2020-01-23 LAB — APTT: aPTT: 34 seconds (ref 24–36)

## 2020-01-23 LAB — RESPIRATORY PANEL BY RT PCR (FLU A&B, COVID)
Influenza A by PCR: NEGATIVE
Influenza B by PCR: NEGATIVE
SARS Coronavirus 2 by RT PCR: NEGATIVE

## 2020-01-23 LAB — TROPONIN I (HIGH SENSITIVITY): Troponin I (High Sensitivity): 13 ng/L (ref <18)

## 2020-01-23 LAB — LACTIC ACID, PLASMA: Lactic Acid, Venous: 1.2 mmol/L (ref 0.5–1.9)

## 2020-01-23 LAB — BRAIN NATRIURETIC PEPTIDE: B Natriuretic Peptide: 55 pg/mL (ref 0.0–100.0)

## 2020-01-23 MED ORDER — ACETAMINOPHEN 325 MG PO TABS
650.0000 mg | ORAL_TABLET | Freq: Four times a day (QID) | ORAL | Status: DC | PRN
  Administered 2020-01-27: 650 mg via ORAL
  Filled 2020-01-23: qty 2

## 2020-01-23 MED ORDER — ALBUTEROL SULFATE (2.5 MG/3ML) 0.083% IN NEBU: 2.5000 mg | INHALATION_SOLUTION | Freq: Once | RESPIRATORY_TRACT | Status: DC

## 2020-01-23 MED ORDER — ONDANSETRON HCL 4 MG/2ML IJ SOLN: 4.0000 mg | Freq: Four times a day (QID) | INTRAMUSCULAR | Status: DC | PRN

## 2020-01-23 MED ORDER — PREDNISONE 20 MG PO TABS
40.0000 mg | ORAL_TABLET | Freq: Every day | ORAL | Status: DC
  Administered 2020-01-24: 40 mg via ORAL
  Filled 2020-01-23: qty 4
  Filled 2020-01-23: qty 2

## 2020-01-23 MED ORDER — ONDANSETRON HCL 4 MG PO TABS: 4.0000 mg | ORAL_TABLET | Freq: Four times a day (QID) | ORAL | Status: DC | PRN

## 2020-01-23 MED ORDER — ALBUTEROL SULFATE HFA 108 (90 BASE) MCG/ACT IN AERS
6.0000 | INHALATION_SPRAY | Freq: Once | RESPIRATORY_TRACT | Status: AC
  Administered 2020-01-23: 6 via RESPIRATORY_TRACT
  Filled 2020-01-23: qty 6.7

## 2020-01-23 MED ORDER — GUAIFENESIN-DM 100-10 MG/5ML PO SYRP
5.0000 mL | ORAL_SOLUTION | ORAL | Status: DC | PRN
  Filled 2020-01-23: qty 5

## 2020-01-23 MED ORDER — METHYLPREDNISOLONE SODIUM SUCC 40 MG IJ SOLR
40.0000 mg | Freq: Four times a day (QID) | INTRAMUSCULAR | Status: AC
  Administered 2020-01-23 – 2020-01-24 (×4): 40 mg via INTRAVENOUS
  Filled 2020-01-23 (×4): qty 1

## 2020-01-23 MED ORDER — DILTIAZEM HCL ER COATED BEADS 180 MG PO CP24
180.0000 mg | ORAL_CAPSULE | Freq: Every day | ORAL | Status: DC
  Administered 2020-01-24 – 2020-01-27 (×4): 180 mg via ORAL
  Filled 2020-01-23 (×5): qty 1

## 2020-01-23 MED ORDER — LEVOTHYROXINE SODIUM 25 MCG PO TABS
137.0000 ug | ORAL_TABLET | Freq: Every day | ORAL | Status: DC
  Administered 2020-01-24 – 2020-01-27 (×4): 137 ug via ORAL
  Filled 2020-01-23 (×4): qty 1

## 2020-01-23 MED ORDER — BUDESONIDE 0.5 MG/2ML IN SUSP
2.0000 mg | Freq: Four times a day (QID) | RESPIRATORY_TRACT | Status: DC
  Filled 2020-01-23: qty 8

## 2020-01-23 MED ORDER — MELATONIN 3 MG PO TABS
9.0000 mg | ORAL_TABLET | Freq: Every day | ORAL | Status: DC
  Administered 2020-01-23 – 2020-01-26 (×4): 9 mg via ORAL
  Filled 2020-01-23 (×4): qty 3

## 2020-01-23 MED ORDER — APIXABAN 2.5 MG PO TABS
2.5000 mg | ORAL_TABLET | Freq: Two times a day (BID) | ORAL | Status: DC
  Administered 2020-01-23 – 2020-01-27 (×9): 2.5 mg via ORAL
  Filled 2020-01-23 (×10): qty 1

## 2020-01-23 MED ORDER — FUROSEMIDE 40 MG PO TABS
40.0000 mg | ORAL_TABLET | Freq: Every day | ORAL | Status: DC
  Administered 2020-01-23 – 2020-01-27 (×5): 40 mg via ORAL
  Filled 2020-01-23 (×5): qty 1

## 2020-01-23 MED ORDER — IPRATROPIUM-ALBUTEROL 0.5-2.5 (3) MG/3ML IN SOLN: 3.0000 mL | Freq: Once | RESPIRATORY_TRACT | Status: DC

## 2020-01-23 MED ORDER — BUDESONIDE 0.5 MG/2ML IN SUSP
2.0000 mg | Freq: Two times a day (BID) | RESPIRATORY_TRACT | Status: DC
  Administered 2020-01-23: 2 mg via RESPIRATORY_TRACT
  Filled 2020-01-23: qty 8

## 2020-01-23 MED ORDER — SODIUM CHLORIDE 0.9 % IV SOLN: 1.0000 g | Freq: Once | INTRAVENOUS | Status: DC

## 2020-01-23 MED ORDER — IPRATROPIUM-ALBUTEROL 20-100 MCG/ACT IN AERS
2.0000 | INHALATION_SPRAY | Freq: Once | RESPIRATORY_TRACT | Status: AC
  Administered 2020-01-23: 2 via RESPIRATORY_TRACT
  Filled 2020-01-23: qty 4

## 2020-01-23 MED ORDER — SODIUM CHLORIDE 0.9 % IV SOLN
2.0000 g | Freq: Two times a day (BID) | INTRAVENOUS | Status: DC
  Administered 2020-01-23 – 2020-01-24 (×3): 2 g via INTRAVENOUS
  Filled 2020-01-23 (×4): qty 2

## 2020-01-23 MED ORDER — IPRATROPIUM-ALBUTEROL 0.5-2.5 (3) MG/3ML IN SOLN
3.0000 mL | Freq: Four times a day (QID) | RESPIRATORY_TRACT | Status: DC
  Administered 2020-01-23 (×2): 3 mL via RESPIRATORY_TRACT
  Filled 2020-01-23 (×3): qty 3

## 2020-01-23 NOTE — ED Provider Notes (Signed)
[facility] EMERGENCY DEPARTMENT Provider Note   CSN: [id number] Arrival date & time: 01/23/20  0820     History Chief Complaint  Patient presents with  . Shortness of Breath    Heather [name] is a 86 y.o. female.  HPI 86 year old female presents with shortness of breath and cough.  About 2 or 3 weeks ago she had a cold that she states never seem to go away.  Over the last 2 days the cough is worse and she is short of breath.  She uses as needed oxygen but has been using it nearly continuously over the last couple days.  EMS found her to have O2 sats of 86% and gave her albuterol and Solu-Medrol.  Her chest does not hurt but feels tight.  She has chronic lower extremity edema that is unchanged.  Has not been vaccinated versus Covid, but has had Covid in the past and this does not feel similar.  No known fevers.   Past Medical History:  Diagnosis Date  . Anxiety   . Arthritis   . Atrial fibrillation ([place])   . Cerumen impaction   . COPD (chronic obstructive pulmonary disease) ([place])   . Dysrhythmia    AF  . Essential hypertension, benign   . Gallstones   . GERD (gastroesophageal reflux disease)   . H/O hiatal hernia   . History of nuclear stress test 10/2010   dipyridamole; normal pattern of perfusion; low risk, normal study  . 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 Mild aortic insufficiency   . Osteoporosis   . Peripheral neuropathy   . Pneumonia    states she had it twice, last time a couple of months ago  . PONV (postoperative nausea and vomiting)   . Shortness of breath    with exertion  . Unspecified hypothyroidism   . Venous insufficiency     Patient Active Problem List   Diagnosis Date Noted  . Acute on chronic respiratory failure with hypoxia ([place]) 10/18/2019  . COPD exacerbation ([place]) 10/17/2019  . Pulmonary nodule 03/29/2019  . Acute respiratory failure with hypoxemia ([place]) 01/06/2019  . Pneumonia due  to COVID-19 virus 01/06/2019  . Hypotension 01/05/2019  . Acute respiratory failure with hypoxia ([place]) 04/27/2018  . Protein-calorie malnutrition, severe 04/26/2018  . Skin avulsion 09/19/2016  . Influenza B 04/16/2016  . Sepsis ([place]) 04/16/2016  . Hypokalemia 04/16/2016  . Hypothyroidism 04/16/2016  . Post herpetic neuralgia 06/06/2014  . Shingles outbreak 04/19/2014  . Gallstones 09/20/2013  . Community acquired pneumonia 05/17/2013  . COPD (chronic obstructive pulmonary disease) ([place]) 05/17/2013  . Dyspnea on exertion 04/25/2013  . Pulmonary emphysema ([place]) 11/15/2012  . HTN (hypertension) 11/15/2012  . PAF (paroxysmal atrial fibrillation) ([place]) 06/14/2012  . Long term current use of anticoagulant therapy 06/14/2012  . Ventral hernia 07/31/2011  . Abdominal wall mass 07/01/2011    Past Surgical History:  Procedure Laterality Date  . APPENDECTOMY  1935  . BACK SURGERY     x2  . Cardiometablic Testing  6/71/2458   submaximal effort with peak RER of 0.5, peak VO2 79% predicted; HR peak up to 78%; PVC was 55% predicted, PEV1 39% predicted; PEV1/VC ratio was reduced; normal vital capacity; DLCO was reduced to 63%  . CARPAL TUNNEL RELEASE    . CATARACT EXTRACTION W/ INTRAOCULAR LENS  IMPLANT, BILATERAL    . CHOLECYSTECTOMY  04/07/2014   dr toth  . CHOLECYSTECTOMY N/A 04/07/2014   Procedure: LAPAROSCOPIC CHOLECYSTECTOMY WITH  INTRAOPERATIVE CHOLANGIOGRAM POSSBILE OPEN;  Surgeon: Autumn Messing III, MD;  Location: [facility];  Service: General;  Laterality: N/A;  . COLON SURGERY  2008   partial  . ESOPHAGOGASTRODUODENOSCOPY (EGD) WITH PROPOFOL N/A 05/25/2017   Procedure: ESOPHAGOGASTRODUODENOSCOPY (EGD) WITH PROPOFOL;  Surgeon: Clarene Essex, MD;  Location: [facility];  Service: Endoscopy;  Laterality: N/A;  . HERNIA REPAIR    . JOINT REPLACEMENT    . left hip relacement  2000  . SAVORY DILATION N/A 05/25/2017   Procedure: SAVORY DILATION;  Surgeon: Clarene Essex, MD;  Location: [facility] ENDOSCOPY;   Service: Endoscopy;  Laterality: N/A;  . TONSILLECTOMY  1935  . TOTAL ABDOMINAL HYSTERECTOMY  1970  . TRANSTHORACIC ECHOCARDIOGRAM  05/2011   EF=>55%; mild conc LVH; mild mitral annular calcif; mild TR; AV mildly sclerotic & mild AR  . VENTRAL HERNIA REPAIR  09/19/2011   Procedure: LAPAROSCOPIC VENTRAL HERNIA;  Surgeon: Merrie Roof, MD;  Location: [facility];  Service: General;  Laterality: N/A;  laparoscopic ventral hernia repair with mesh     OB History   No obstetric history on file.     Family History  Problem Relation Age of Onset  . Stroke Mother   . Stroke Father   . Heart block Brother   . Bladder Cancer Brother   . Diabetes Brother   . Stroke Brother     Social History   Tobacco Use  . Smoking status: Former Smoker    Packs/day: 1.00    Years: 20.00    Pack years: 20.00    Types: Cigarettes    Start date: 24    Quit date: 09/12/1978    Years since quitting: 41.3  . Smokeless tobacco: Never Used  Vaping Use  . Vaping Use: Never used  Substance Use Topics  . Alcohol use: Not Currently    Alcohol/week: 0.0 standard drinks    Comment: occ  . Drug use: No    Home Medications Prior to Admission medications   Medication Sig Start Date End Date Taking? Authorizing Provider  acetaminophen (TYLENOL) 325 MG tablet Take 2 tablets (650 mg total) by mouth every 6 (six) hours as needed for mild pain or headache (fever >/= 101). Patient taking differently: Take 650 mg by mouth every 6 (six) hours as needed for mild pain or headache.  01/11/19  Yes Arrien, Jimmy Picket, MD  acetaminophen (TYLENOL) 650 MG CR tablet Take 650 mg by mouth daily as needed for pain.    Yes [provider]  albuterol (PROVENTIL) (2.5 MG/3ML) 0.083% nebulizer solution Take 2.5 mg by nebulization every 6 (six) hours as needed for wheezing or shortness of breath.   Yes [provider]  albuterol (VENTOLIN HFA) 108 (90 Base) MCG/ACT inhaler Inhale 2 puffs into the lungs every 6  (six) hours as needed for wheezing or shortness of breath. 04/26/19  Yes Byrum, Rose Fillers, MD  budesonide-formoterol (SYMBICORT) 160-4.5 MCG/ACT inhaler Inhale 1 puff into the lungs 2 (two) times daily.    Yes [provider]  calcium citrate-vitamin D (CITRACAL+D) 315-200 MG-UNIT per tablet Take 1 tablet by mouth daily.   Yes [provider]  Carboxymethylcellulose Sodium (THERATEARS OP) Place 2 drops into both eyes daily.    Yes [provider]  cholecalciferol (VITAMIN D3) 25 MCG (1000 UNIT) tablet Take 1,000 Units by mouth daily.   Yes [provider]  diltiazem (CARDIZEM CD) 180 MG 24 hr capsule Take 180 mg by mouth daily.    Yes  [provider]  ELIQUIS 2.5 MG TABS tablet Take 1 tablet by mouth twice daily Patient taking differently: Take 2.5 mg by mouth 2 (two) times daily.  11/03/19  Yes Hilty, Nadean Corwin, MD  feeding supplement, ENSURE ENLIVE, (ENSURE ENLIVE) LIQD Take 237 mLs by mouth 2 (two) times daily between meals. 10/21/19  Yes Shah, Pratik D, DO  fluticasone (FLONASE) 50 MCG/ACT nasal spray Place 2 sprays into both nostrils at bedtime.  06/21/19  Yes [provider]  furosemide (LASIX) 40 MG tablet Take 40 mg by mouth daily.  11/22/19  Yes [provider]  guaiFENesin-dextromethorphan (ROBITUSSIN DM) 100-10 MG/5ML syrup Take 5 mLs by mouth every 6 (six) hours as needed for cough. Patient taking differently: Take 10 mLs by mouth every 6 (six) hours as needed for cough.  01/11/19  Yes Arrien, Jimmy Picket, MD  KLOR-CON M20 20 MEQ tablet Take 20 mEq by mouth daily.  12/24/19  Yes [provider]  Lactobacillus Reuteri (BIOGAIA PROBIOTIC PO) Take 1 capsule by mouth daily.   Yes [provider]  levothyroxine (SYNTHROID, LEVOTHROID) 137 MCG tablet Take 137 mcg by mouth daily before breakfast.    Yes [provider]  losartan (COZAAR) 25 MG tablet Take 25 mg by mouth daily.  12/28/19  Yes [provider]  magnesium oxide (MAG-OX) 400 MG tablet Take 400 mg by mouth daily.   Yes [provider]  Melatonin 10 MG CAPS Take 10 mg by mouth at bedtime.   Yes [provider]  Multiple Vitamins-Minerals (MULTIVITAMIN WITH MINERALS) tablet Take 1 tablet by mouth daily.   Yes [provider]  polyethylene glycol (MIRALAX / GLYCOLAX) packet Take 17 g by mouth daily.    Yes [provider]  predniSONE (DELTASONE) 10 MG tablet Take 10 mg by mouth daily as needed (for breathing).  12/28/19  Yes [provider]  Teriparatide, Recombinant, (FORTEO) 620 MCG/2.48ML SOPN Inject 20 mcg into the skin daily before breakfast.    Yes [provider]    Allergies    Ace inhibitors and Sulfur  Review of Systems   Review of Systems  Constitutional: Negative for fever.  Respiratory: Positive for cough, chest tightness and shortness of breath.   Cardiovascular: Positive for leg swelling. Negative for chest pain.  All other systems reviewed and are negative.   Physical Exam Updated Vital Signs BP 96/81   Pulse (!) 109   Temp 97.7 F (36.5 C) (Oral)   Resp (!) 31   Ht 5\' 3"  (1.6 m)   Wt 45.4 kg   SpO2 96%   BMI 17.71 kg/m   Physical Exam Vitals and nursing note reviewed.  Constitutional:      Appearance: She is well-developed. She is not diaphoretic.  HENT:     Head: Normocephalic and atraumatic.     Right Ear: External ear normal.     Left Ear: External ear normal.     Nose: Nose normal.  Eyes:     General:        Right eye: No discharge.        Left eye: No discharge.  Cardiovascular:     Rate and Rhythm: Regular rhythm. Tachycardia present.     Heart sounds: Normal heart sounds.  Pulmonary:     Effort: Tachypnea present. No accessory muscle usage.     Breath sounds: Wheezing (mild, expiratory) present.  Abdominal:     General: There is no distension.  Musculoskeletal:  Right lower leg: Edema present.     Left lower leg:  Edema present.     Comments: Mild pitting edema to BLE  Skin:    General: Skin is warm and dry.  Neurological:     Mental Status: She is alert.  Psychiatric:        Mood and Affect: Mood is not anxious.     ED Results / Procedures / Treatments   Labs (all labs ordered are listed, but only abnormal results are displayed) Labs Reviewed  COMPREHENSIVE METABOLIC PANEL - Abnormal; Notable for the following components:      Result Value   Glucose, Bld 132 (*)    Total Protein 6.0 (*)    Albumin 3.4 (*)    Total Bilirubin 1.8 (*)    All other components within normal limits  CBC WITH DIFFERENTIAL/PLATELET - Abnormal; Notable for the following components:   Neutro Abs 7.9 (*)    All other components within normal limits  PROTIME-INR - Abnormal; Notable for the following components:   Prothrombin Time 15.5 (*)    INR 1.3 (*)    All other components within normal limits  CULTURE, BLOOD (SINGLE)  RESPIRATORY PANEL BY RT PCR (FLU A&B, COVID)  URINE CULTURE  BRAIN NATRIURETIC PEPTIDE  LACTIC ACID, PLASMA  APTT  URINALYSIS, ROUTINE W REFLEX MICROSCOPIC  TROPONIN I (HIGH SENSITIVITY)    EKG EKG Interpretation  Date/Time:  Monday January 23 2020 08:32:08 EDT Ventricular Rate:  105 PR Interval:    QRS Duration: 78 QT Interval:  352 QTC Calculation: 466 R Axis:   67 Text Interpretation: Sinus tachycardia Ventricular premature complex Consider right atrial enlargement Anteroseptal infarct, old Confirmed by Sherwood Gambler [phone]) on 01/23/2020 8:39:15 AM   Radiology DG Chest Port 1 View  Result Date: 01/23/2020 CLINICAL DATA:  Cough EXAM: PORTABLE CHEST 1 VIEW COMPARISON:  Prior chest x-ray 10/17/2019 FINDINGS: The patient is rotated to the right. Stable cardiac and mediastinal contours given the rightward rotation. Atherosclerotic calcifications again noted in the transverse aorta. Persistent elevation of the posterior right hemidiaphragm, unchanged. Overall, hyperinflation with  lucencies in the upper lungs compatible with emphysema. Background bronchitic changes remain stable. No focal airspace infiltrate, pleural effusion or pneumothorax. No acute osseous abnormality. IMPRESSION: 1. No acute cardiopulmonary process. 2. Stable hyperinflation, chronic bronchitic changes and upper lung predominant emphysematous changes. Overall features suggest COPD. 3. Stable chronic elevation of the right hemidiaphragm. 4.  Aortic Atherosclerosis (ICD10-I70.0). Electronically Signed   By: Jacqulynn Cadet M.D.   On: 01/23/2020 10:05    Procedures Procedures (including critical care time)  Medications Ordered in ED Medications  albuterol (PROVENTIL) (2.5 MG/3ML) 0.083% nebulizer solution 2.5 mg (has no administration in time range)  ipratropium-albuterol (DUONEB) 0.5-2.5 (3) MG/3ML nebulizer solution 3 mL (has no administration in time range)  cefTRIAXone (ROCEPHIN) 1 g in sodium chloride 0.9 % 100 mL IVPB (has no administration in time range)  albuterol (VENTOLIN HFA) 108 (90 Base) MCG/ACT inhaler 6 puff (6 puffs Inhalation Given 01/23/20 0915)  Ipratropium-Albuterol (COMBIVENT) respimat 2 puff (2 puffs Inhalation Given 01/23/20 1050)    ED Course  I have reviewed the triage vital signs and the nursing notes.  Pertinent labs & imaging results that were available during my care of the patient were reviewed by me and considered in my medical decision making (see chart for details).    MDM Rules/Calculators/A&P  Patient is feeling better after albuterol in the emergency department but is still short of breath and tachypneic.  She is still requiring oxygen which is not typical for her.  There is no obvious bacterial pneumonia but given COPD exacerbation with increased sputum, I think antibiotics are warranted and so she was given IV Rocephin.  Work-up for sepsis is negative.  Doubt pulmonary embolism or ACS.  Admit to hospitalist service. Final Clinical  Impression(s) / ED Diagnoses Final diagnoses:  COPD exacerbation [facility])    Rx / DC Orders ED Discharge Orders    None       Sherwood Gambler, MD 01/23/20 [phone]

## 2020-01-23 NOTE — H&P (Signed)
History and Physical    Heather [name] YTK:[id number] DOB: 11/12/1926 DOA: 01/23/2020  I have briefly reviewed the patient's prior medical records in [place]  PCP: Burnard Bunting, MD  Patient coming from: home  Chief Complaint: shortness of breath   HPI: Heather [name] is a 86 y.o. female with medical history significant of COPD, atrial fibrillation, hypertension, osteoporosis, presents to the hospital with chief complaint of shortness of breath.  Patient reports progressive worsening shortness of breath over the last 1 to 2 days, increased wheezing, increased cough with thick mucus production.  Patient has PRN oxygen at home but over the last day she has had to use it constantly due to persistent shortness of breath.  She denies any fever or chills.  She reports sinus congestion 3 weeks ago for which she completed a course of antibiotics as well as steroids.  She complains of chest pain with coughing.  Reports mild nausea but no vomiting, no abdominal pain.  Has had loose stools following antibiotic treatment but no diarrhea.  EMS was called and she was hypoxic with sats in the mid 80s, she was given albuterol, steroids and was brought to the ED.    ED Course: In the ED she is afebrile 97.7, tachypneic, heart rate is 100 and blood pressure is between 90s and 120s.  She is satting well on 2 L.  Blood work reveals normal renal function, glucose 132, BNP normal, high-sensitivity troponin negative.  CBC is unremarkable.  Lactic acid is 1.2. Chest x-ray with COPD changes but no acute findings.  Covid is negative.  Given persistent wheezing and tachypnea after initial interventions we are asked to admit.  Review of Systems: All systems reviewed, and apart from HPI, all negative  Past Medical History:  Diagnosis Date  . Anxiety   . Arthritis   . Atrial fibrillation ([place])   . Cerumen impaction   . COPD (chronic obstructive pulmonary disease) ([place])   . Dysrhythmia    AF  . Essential  hypertension, benign   . Gallstones   . GERD (gastroesophageal reflux disease)   . H/O hiatal hernia   . History of nuclear stress test 10/2010   dipyridamole; normal pattern of perfusion; low risk, normal study  . 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 Mild aortic insufficiency   . Osteoporosis   . Peripheral neuropathy   . Pneumonia    states she had it twice, last time a couple of months ago  . PONV (postoperative nausea and vomiting)   . Shortness of breath    with exertion  . Unspecified hypothyroidism   . Venous insufficiency     Past Surgical History:  Procedure Laterality Date  . APPENDECTOMY  1935  . BACK SURGERY     x2  . Cardiometablic Testing  5/57/3220   submaximal effort with peak RER of 0.5, peak VO2 79% predicted; HR peak up to 78%; PVC was 55% predicted, PEV1 39% predicted; PEV1/VC ratio was reduced; normal vital capacity; DLCO was reduced to 63%  . CARPAL TUNNEL RELEASE    . CATARACT EXTRACTION W/ INTRAOCULAR LENS  IMPLANT, BILATERAL    . CHOLECYSTECTOMY  04/07/2014   dr toth  . CHOLECYSTECTOMY N/A 04/07/2014   Procedure: LAPAROSCOPIC CHOLECYSTECTOMY WITH INTRAOPERATIVE CHOLANGIOGRAM POSSBILE OPEN;  Surgeon: Autumn Messing III, MD;  Location: [facility];  Service: General;  Laterality: N/A;  . COLON SURGERY  2008   partial  . ESOPHAGOGASTRODUODENOSCOPY (EGD) WITH PROPOFOL N/A 05/25/2017  Procedure: ESOPHAGOGASTRODUODENOSCOPY (EGD) WITH PROPOFOL;  Surgeon: Clarene Essex, MD;  Location: [facility];  Service: Endoscopy;  Laterality: N/A;  . HERNIA REPAIR    . JOINT REPLACEMENT    . left hip relacement  2000  . SAVORY DILATION N/A 05/25/2017   Procedure: SAVORY DILATION;  Surgeon: Clarene Essex, MD;  Location[facility] ENDOSCOPY;  Service: Endoscopy;  Laterality: N/A;  . TONSILLECTOMY  1935  . TOTAL ABDOMINAL HYSTERECTOMY  1970  . TRANSTHORACIC ECHOCARDIOGRAM  05/2011   EF=>55%; mild conc LVH; mild mitral annular calcif; mild TR; AV mildly  sclerotic & mild AR  . VENTRAL HERNIA REPAIR  09/19/2011   Procedure: LAPAROSCOPIC VENTRAL HERNIA;  Surgeon: Merrie Roof, MD;  Location: [facility];  Service: General;  Laterality: N/A;  laparoscopic ventral hernia repair with mesh     reports that she quit smoking about 41 years ago. Her smoking use included cigarettes. She started smoking about 61 years ago. She has a 20.00 pack-year smoking history. She has never used smokeless tobacco. She reports previous alcohol use. She reports that she does not use drugs.  Allergies  Allergen Reactions  . Ace Inhibitors Cough  . Sulfur Nausea And Vomiting    Family History  Problem Relation Age of Onset  . Stroke Mother   . Stroke Father   . Heart block Brother   . Bladder Cancer Brother   . Diabetes Brother   . Stroke Brother     Prior to Admission medications   Medication Sig Start Date End Date Taking? Authorizing Provider  acetaminophen (TYLENOL) 325 MG tablet Take 2 tablets (650 mg total) by mouth every 6 (six) hours as needed for mild pain or headache (fever >/= 101). Patient taking differently: Take 650 mg by mouth every 6 (six) hours as needed for mild pain or headache.  01/11/19  Yes Arrien, Jimmy Picket, MD  acetaminophen (TYLENOL) 650 MG CR tablet Take 650 mg by mouth daily as needed for pain.    Yes [provider]  albuterol (PROVENTIL) (2.5 MG/3ML) 0.083% nebulizer solution Take 2.5 mg by nebulization every 6 (six) hours as needed for wheezing or shortness of breath.   Yes [provider]  albuterol (VENTOLIN HFA) 108 (90 Base) MCG/ACT inhaler Inhale 2 puffs into the lungs every 6 (six) hours as needed for wheezing or shortness of breath. 04/26/19  Yes Byrum, Rose Fillers, MD  budesonide-formoterol (SYMBICORT) 160-4.5 MCG/ACT inhaler Inhale 1 puff into the lungs 2 (two) times daily.    Yes [provider]  calcium citrate-vitamin D (CITRACAL+D) 315-200 MG-UNIT per tablet Take 1 tablet by mouth daily.   Yes  [provider]  Carboxymethylcellulose Sodium (THERATEARS OP) Place 2 drops into both eyes daily.    Yes [provider]  cholecalciferol (VITAMIN D3) 25 MCG (1000 UNIT) tablet Take 1,000 Units by mouth daily.   Yes [provider]  diltiazem (CARDIZEM CD) 180 MG 24 hr capsule Take 180 mg by mouth daily.    Yes [provider]  ELIQUIS 2.5 MG TABS tablet Take 1 tablet by mouth twice daily Patient taking differently: Take 2.5 mg by mouth 2 (two) times daily.  11/03/19  Yes Hilty, Nadean Corwin, MD  feeding supplement, ENSURE ENLIVE, (ENSURE ENLIVE) LIQD Take 237 mLs by mouth 2 (two) times daily between meals. 10/21/19  Yes Shah, Pratik D, DO  fluticasone (FLONASE) 50 MCG/ACT nasal spray Place 2 sprays into both nostrils at bedtime.  06/21/19  Yes [provider]  furosemide (LASIX) 40 MG tablet Take 40 mg by mouth daily.  11/22/19  Yes [provider]  guaiFENesin-dextromethorphan (ROBITUSSIN DM) 100-10 MG/5ML syrup Take 5 mLs by mouth every 6 (six) hours as needed for cough. Patient taking differently: Take 10 mLs by mouth every 6 (six) hours as needed for cough.  01/11/19  Yes Arrien, Jimmy Picket, MD  KLOR-CON M20 20 MEQ tablet Take 20 mEq by mouth daily.  12/24/19  Yes [provider]  Lactobacillus Reuteri (BIOGAIA PROBIOTIC PO) Take 1 capsule by mouth daily.   Yes [provider]  levothyroxine (SYNTHROID, LEVOTHROID) 137 MCG tablet Take 137 mcg by mouth daily before breakfast.    Yes [provider]  losartan (COZAAR) 25 MG tablet Take 25 mg by mouth daily.  12/28/19  Yes [provider]  magnesium oxide (MAG-OX) 400 MG tablet Take 400 mg by mouth daily.   Yes [provider]  Melatonin 10 MG CAPS Take 10 mg by mouth at bedtime.   Yes [provider]  Multiple Vitamins-Minerals (MULTIVITAMIN WITH MINERALS) tablet Take 1 tablet by mouth daily.   Yes [provider]  polyethylene glycol  (MIRALAX / GLYCOLAX) packet Take 17 g by mouth daily.    Yes [provider]  predniSONE (DELTASONE) 10 MG tablet Take 10 mg by mouth daily as needed (for breathing).  12/28/19  Yes [provider]  Teriparatide, Recombinant, (FORTEO) 620 MCG/2.48ML SOPN Inject 20 mcg into the skin daily before breakfast.    Yes [provider]    Physical Exam: Vitals:   01/23/20 0900 01/23/20 0916 01/23/20 1000 01/23/20 1030  BP:   111/65 96/81  Pulse: (!) 110  (!) 109 (!) 109  Resp: (!) 28  (!) 29 (!) 31  Temp:      TempSrc:      SpO2: (!) 87%  98% 96%  Weight:  45.4 kg    Height:  5\' 3"  (1.6 m)      Constitutional: Tachypneic, mildly uncomfortable Eyes: PERRL, lids and conjunctivae normal ENMT: Mucous membranes are moist. Neck: normal, supple Respiratory: Diffuse bilateral wheezing, increased respiratory effort, tachypneic Cardiovascular: Regular rate and rhythm, no murmurs / rubs / gallops.  1+ edema on the left lower extremity, trace on right Abdomen: no tenderness, no masses palpated. Bowel sounds positive.  Musculoskeletal: no clubbing / cyanosis. Normal muscle tone.  Skin: no rashes, lesions, ulcers. No induration Neurologic: CN 2-12 grossly intact. Strength 5/5 in all 4.  Psychiatric: Normal judgment and insight. Alert and oriented x 3. Normal mood.   Labs on Admission: I have personally reviewed following labs and imaging studies  CBC: Recent Labs  Lab 01/23/20 0908  WBC 10.3  NEUTROABS 7.9*  HGB 13.6  HCT 41.4  MCV 95.8  PLT 161   Basic Metabolic Panel: Recent Labs  Lab 01/23/20 0908  NA 139  K 3.7  CL 99  CO2 29  GLUCOSE 132*  BUN 17  CREATININE 0.65  CALCIUM 9.1   Liver Function Tests: Recent Labs  Lab 01/23/20 0908  AST 20  ALT 19  ALKPHOS 68  BILITOT 1.8*  PROT 6.0*  ALBUMIN 3.4*   Coagulation Profile: Recent Labs  Lab 01/23/20 0908  INR 1.3*   BNP (last 3 results) No results for input(s): PROBNP in the last 8760  hours. CBG: No results for input(s): GLUCAP in the last 168 hours. Thyroid Function Tests: No results for input(s): TSH, T4TOTAL, FREET4, T3FREE, THYROIDAB in the last 72 hours.  Urine analysis:    Component Value Date/Time   COLORURINE YELLOW 10/17/2019 [place] 10/17/2019 1755   LABSPEC 1.008 10/17/2019 1755   PHURINE 7.0 10/17/2019 [place] 10/17/2019 1755   HGBUR NEGATIVE 10/17/2019 1755   [place] 10/17/2019 1755   [place] 10/17/2019 1755   PROTEINUR NEGATIVE 10/17/2019 1755   UROBILINOGEN 0.2 05/17/2013 1207   NITRITE NEGATIVE 10/17/2019 1755   LEUKOCYTESUR NEGATIVE 10/17/2019 1755     Radiological Exams on Admission: DG Chest Port 1 View  Result Date: 01/23/2020 CLINICAL DATA:  Cough EXAM: PORTABLE CHEST 1 VIEW COMPARISON:  Prior chest x-ray 10/17/2019 FINDINGS: The patient is rotated to the right. Stable cardiac and mediastinal contours given the rightward rotation. Atherosclerotic calcifications again noted in the transverse aorta. Persistent elevation of the posterior right hemidiaphragm, unchanged. Overall, hyperinflation with lucencies in the upper lungs compatible with emphysema. Background bronchitic changes remain stable. No focal airspace infiltrate, pleural effusion or pneumothorax. No acute osseous abnormality. IMPRESSION: 1. No acute cardiopulmonary process. 2. Stable hyperinflation, chronic bronchitic changes and upper lung predominant emphysematous changes. Overall features suggest COPD. 3. Stable chronic elevation of the right hemidiaphragm. 4.  Aortic Atherosclerosis (ICD10-I70.0). Electronically Signed   By: Jacqulynn Cadet M.D.   On: 01/23/2020 10:05    EKG: Independently reviewed.  Sinus tachycardia  Assessment/Plan  Principal Problem Acute on chronic hypoxic respiratory failure due to COPD exacerbation -admit patient to progressive, provide supplemental oxygen to maintain sats with goal 88% -Start  steroids, DuoNeb, Pulmicort -Given productive cough placed on antibiotics with pseudomonal coverage  Active Problems Paroxysmal A. Fib -Currently in sinus rhythm, continue Eliquis for anticoagulation, continue diltiazem  Lower extremity edema, L > R -Chronic, left lower extremity is chronically more swollen than the right, hold home Lasix due to soft blood pressure currently, resume if BP allows tomorrow -place TED hose  Essential hypertension -Continue diltiazem for rate control, hold Lasix and losartan due to soft blood pressure in the ED  Hypothyroidism -Continue Synthroid  Osteoporosis -Outpatient management   DVT prophylaxis: On Eliquis Code Status: Full code per patient's and daughter's wishes Family Communication: Daughter present at bedside Disposition Plan: Home when ready Bed Type: Progressive Consults called: None Obs/Inp: Observation   Marzetta Board, MD, PhD [facility]  Contact via [email or web address]  01/23/2020, 11:22 AM

## 2020-01-23 NOTE — ED Notes (Signed)
Put patient on the bedpan patient couldn't use it I will try again later

## 2020-01-23 NOTE — ED Notes (Signed)
Got patient undressed into a gown on the monitor patient is resting with call bell in reach

## 2020-01-23 NOTE — ED Triage Notes (Signed)
Pt arrived via [facility] from home for SOB and tachypnea. Pt was recently dx w/sinus inf and given prednisone and atx. Per EMS daughter told them the infection has traveled to chest and pt has been having SOB and tachypnea. Pt uses O2 at home prn, but has been using it constantly the last few days. Per EMS lungs were diminished throughout and she had wheezing in upper lobes of lungs and was tachypneic.[facility] gave albuterol 5mg x2, atrovent 0.5mg x2 and solumedrol 125mg . Per EMS pt has productive cough w/yellow mucous. Per EMS pt was 86% on RA. Pt is tachypneic. Pt is A&Ox4. Skin color WNL.

## 2020-01-24 DIAGNOSIS — I480 Paroxysmal atrial fibrillation: Secondary | ICD-10-CM | POA: Diagnosis present

## 2020-01-24 DIAGNOSIS — Z79890 Hormone replacement therapy (postmenopausal): Secondary | ICD-10-CM | POA: Diagnosis not present

## 2020-01-24 DIAGNOSIS — K5900 Constipation, unspecified: Secondary | ICD-10-CM | POA: Diagnosis present

## 2020-01-24 DIAGNOSIS — Z23 Encounter for immunization: Secondary | ICD-10-CM | POA: Diagnosis not present

## 2020-01-24 DIAGNOSIS — Z7951 Long term (current) use of inhaled steroids: Secondary | ICD-10-CM | POA: Diagnosis not present

## 2020-01-24 DIAGNOSIS — I5032 Chronic diastolic (congestive) heart failure: Secondary | ICD-10-CM | POA: Diagnosis not present

## 2020-01-24 DIAGNOSIS — Z90710 Acquired absence of both cervix and uterus: Secondary | ICD-10-CM | POA: Diagnosis not present

## 2020-01-24 DIAGNOSIS — Z79899 Other long term (current) drug therapy: Secondary | ICD-10-CM | POA: Diagnosis not present

## 2020-01-24 DIAGNOSIS — E039 Hypothyroidism, unspecified: Secondary | ICD-10-CM | POA: Diagnosis present

## 2020-01-24 DIAGNOSIS — Z8616 Personal history of COVID-19: Secondary | ICD-10-CM | POA: Diagnosis not present

## 2020-01-24 DIAGNOSIS — J9621 Acute and chronic respiratory failure with hypoxia: Secondary | ICD-10-CM | POA: Diagnosis not present

## 2020-01-24 DIAGNOSIS — I110 Hypertensive heart disease with heart failure: Secondary | ICD-10-CM | POA: Diagnosis present

## 2020-01-24 DIAGNOSIS — Z1624 Resistance to multiple antibiotics: Secondary | ICD-10-CM | POA: Diagnosis not present

## 2020-01-24 DIAGNOSIS — Z7901 Long term (current) use of anticoagulants: Secondary | ICD-10-CM | POA: Diagnosis not present

## 2020-01-24 DIAGNOSIS — K219 Gastro-esophageal reflux disease without esophagitis: Secondary | ICD-10-CM | POA: Diagnosis present

## 2020-01-24 DIAGNOSIS — Z87891 Personal history of nicotine dependence: Secondary | ICD-10-CM | POA: Diagnosis not present

## 2020-01-24 DIAGNOSIS — J441 Chronic obstructive pulmonary disease with (acute) exacerbation: Secondary | ICD-10-CM | POA: Diagnosis not present

## 2020-01-24 DIAGNOSIS — M810 Age-related osteoporosis without current pathological fracture: Secondary | ICD-10-CM | POA: Diagnosis not present

## 2020-01-24 LAB — RESPIRATORY PANEL BY PCR

## 2020-01-24 LAB — PROCALCITONIN: Procalcitonin: 27.98 ng/mL

## 2020-01-24 MED ORDER — IPRATROPIUM-ALBUTEROL 0.5-2.5 (3) MG/3ML IN SOLN: 3.0000 mL | Freq: Two times a day (BID) | RESPIRATORY_TRACT | Status: DC

## 2020-01-24 MED ORDER — METHYLPREDNISOLONE SODIUM SUCC 40 MG IJ SOLR
40.0000 mg | Freq: Two times a day (BID) | INTRAMUSCULAR | Status: DC
  Administered 2020-01-24 – 2020-01-27 (×7): 40 mg via INTRAVENOUS
  Filled 2020-01-24 (×7): qty 1

## 2020-01-24 MED ORDER — REVEFENACIN 175 MCG/3ML IN SOLN
175.0000 ug | Freq: Every day | RESPIRATORY_TRACT | Status: DC
  Administered 2020-01-25 – 2020-01-27 (×3): 175 ug via RESPIRATORY_TRACT
  Filled 2020-01-24 (×5): qty 3

## 2020-01-24 MED ORDER — AZITHROMYCIN 250 MG PO TABS
250.0000 mg | ORAL_TABLET | Freq: Every day | ORAL | Status: DC
  Administered 2020-01-25 – 2020-01-27 (×3): 250 mg via ORAL
  Filled 2020-01-24 (×3): qty 1

## 2020-01-24 MED ORDER — LOSARTAN POTASSIUM 25 MG PO TABS
25.0000 mg | ORAL_TABLET | Freq: Every day | ORAL | Status: DC
  Administered 2020-01-24 – 2020-01-27 (×4): 25 mg via ORAL
  Filled 2020-01-24 (×4): qty 1

## 2020-01-24 MED ORDER — SODIUM CHLORIDE 0.9 % IV SOLN
1.0000 g | INTRAVENOUS | Status: DC
  Administered 2020-01-24 – 2020-01-26 (×3): 1 g via INTRAVENOUS
  Filled 2020-01-24 (×3): qty 1
  Filled 2020-01-24: qty 10

## 2020-01-24 MED ORDER — AZITHROMYCIN 250 MG PO TABS
500.0000 mg | ORAL_TABLET | Freq: Every day | ORAL | Status: AC
  Administered 2020-01-24: 500 mg via ORAL
  Filled 2020-01-24: qty 2

## 2020-01-24 MED ORDER — POTASSIUM CHLORIDE CRYS ER 20 MEQ PO TBCR
20.0000 meq | EXTENDED_RELEASE_TABLET | Freq: Every day | ORAL | Status: DC
  Administered 2020-01-24 – 2020-01-27 (×4): 20 meq via ORAL
  Filled 2020-01-24 (×4): qty 1

## 2020-01-24 MED ORDER — MAGNESIUM OXIDE 400 (241.3 MG) MG PO TABS
400.0000 mg | ORAL_TABLET | Freq: Every day | ORAL | Status: DC
  Administered 2020-01-24 – 2020-01-27 (×4): 400 mg via ORAL
  Filled 2020-01-24 (×6): qty 1

## 2020-01-24 MED ORDER — IPRATROPIUM-ALBUTEROL 0.5-2.5 (3) MG/3ML IN SOLN
3.0000 mL | Freq: Four times a day (QID) | RESPIRATORY_TRACT | Status: DC | PRN
  Administered 2020-01-24 – 2020-01-25 (×3): 3 mL via RESPIRATORY_TRACT
  Filled 2020-01-24 (×3): qty 3

## 2020-01-24 MED ORDER — POLYETHYLENE GLYCOL 3350 17 G PO PACK
17.0000 g | PACK | Freq: Every day | ORAL | Status: DC
  Administered 2020-01-24 – 2020-01-26 (×3): 17 g via ORAL
  Filled 2020-01-24 (×4): qty 1

## 2020-01-24 MED ORDER — ARFORMOTEROL TARTRATE 15 MCG/2ML IN NEBU
15.0000 ug | INHALATION_SOLUTION | Freq: Two times a day (BID) | RESPIRATORY_TRACT | Status: DC
  Administered 2020-01-24 – 2020-01-27 (×6): 15 ug via RESPIRATORY_TRACT
  Filled 2020-01-24 (×6): qty 2

## 2020-01-24 MED ORDER — MOMETASONE FURO-FORMOTEROL FUM 200-5 MCG/ACT IN AERO
2.0000 | INHALATION_SPRAY | Freq: Two times a day (BID) | RESPIRATORY_TRACT | Status: DC
  Administered 2020-01-24: 2 via RESPIRATORY_TRACT
  Filled 2020-01-24: qty 8.8

## 2020-01-24 MED ORDER — GUAIFENESIN-DM 100-10 MG/5ML PO SYRP
10.0000 mL | ORAL_SOLUTION | Freq: Four times a day (QID) | ORAL | Status: DC | PRN
  Administered 2020-01-25: 10 mL via ORAL

## 2020-01-24 MED ORDER — TERIPARATIDE 620 MCG/2.48ML ~~LOC~~ SOPN: 20.0000 ug | PEN_INJECTOR | Freq: Every day | SUBCUTANEOUS | Status: DC

## 2020-01-24 NOTE — Plan of Care (Signed)

## 2020-01-24 NOTE — Evaluation (Signed)
Physical Therapy Evaluation Patient Details Name: Heather [name] MRN: [id number] DOB: 1926-04-13 Today's Date: 01/24/2020   History of Present Illness  Pt is a 86 y.o. female admitted 01/23/20 with SOB, cough and increasing O2 needs over past couple days; workup for COPD exacerbation. Tested (-) COVID-19. PMH includes COPD (baseline 2L O2), afib, hernia.    Clinical Impression  Pt presents with an overall decrease in functional mobility secondary to above. PTA, pt mod indep with rollator, lives with supportive children and working with [place] since recent admission. Today, pt able to transfer and ambulate short distance with RW and min guard; limited by generalized weakness, decreased activity tolerance. SpO2 down to 84% on RA with activity, returning to >/94% on 1L O2 at rest (RN aware). Pt would benefit from continued acute PT services to maximize functional mobility and independence prior to d/c with continued HHPT services.     Follow Up Recommendations Home health PT;Supervision - Intermittent    Equipment Recommendations  None recommended by PT    Recommendations for Other Services       Precautions / Restrictions Precautions Precautions: Fall;Other (comment) Precaution Comments: Watch SpO2; urine incontinence Restrictions Weight Bearing Restrictions: No      Mobility  Bed Mobility Overal bed mobility: Modified Independent             General bed mobility comments: HOB elevated    Transfers Overall transfer level: Needs assistance Equipment used: 1 person hand held assist;Rolling walker (2 wheeled) Transfers: Sit to/from Stand Sit to Stand: Min assist;Min guard         General transfer comment: Initial minA for HHA to pull into standing and pivot to [facility]; additional standing from [facility] and recliner to RW with min guard  Ambulation/Gait Ambulation/Gait assistance: Min guard Gait Distance (Feet): 12 Feet Assistive device: Rolling walker (2 wheeled) Gait  Pattern/deviations: Step-through pattern;Decreased stride length;Trunk flexed Gait velocity: Decreased   General Gait Details: Slow, guarded gait in room with RW and min guard for balance  Stairs            Wheelchair Mobility    Modified Rankin (Stroke Patients Only)       Balance Overall balance assessment: Needs assistance   Sitting balance-Leahy Scale: Good       Standing balance-Leahy Scale: Fair Standing balance comment: Can static stand to pull up and adjust underwear without UE support; able to brush teeth at sink without UE support                             Pertinent Vitals/Pain Pain Assessment: No/denies pain    Home Living Family/patient expects to be discharged to:: Private residence Living Arrangements: Children Available Help at Discharge: Family;Available 24 hours/day Type of Home: House Home Access: Ramped entrance     Home Layout: One level Home Equipment: Grab bars - toilet;Grab bars - tub/shower;Walker - 2 wheels;Cane - single point;Walker - 4 wheels      Prior Function Level of Independence: Independent with assistive device(s)         Comments: Mod indep ambulating with rollator; mod indep with ADLs, able to assist with laundry and cleaning at home. Wears 3-4L O2 [place] with exertion, but does not typically need at rest or to sleep. Sleeps in elevated Sleep Number bed     Hand Dominance   Dominant Hand: Right    Extremity/Trunk Assessment   Upper Extremity Assessment Upper Extremity Assessment: Generalized weakness  Lower Extremity Assessment Lower Extremity Assessment: Generalized weakness    Cervical / Trunk Assessment Cervical / Trunk Assessment: Kyphotic  Communication   Communication: No difficulties  Cognition Arousal/Alertness: Awake/alert Behavior During Therapy: WFL for tasks assessed/performed Overall Cognitive Status: Within Functional Limits for tasks assessed                                  General Comments: WFL for simple tasks; not formally assessed      General Comments General comments (skin integrity, edema, etc.): SpO2 down to 84% on RA and DOE 3/4, up to 94% on 1L O2 [place] with pursed lip breathing at rest; pt with good breathing technique    Exercises     Assessment/Plan    PT Assessment Patient needs continued PT services  PT Problem List Decreased strength;Decreased activity tolerance;Decreased balance;Decreased mobility;Cardiopulmonary status limiting activity       PT Treatment Interventions Gait training;Stair training;Functional mobility training;DME instruction;Therapeutic activities;Therapeutic exercise;Balance training;Patient/family education    PT Goals (Current goals can be found in the Care Plan section)  Acute Rehab PT Goals Patient Stated Goal: Return home with better breathing status PT Goal Formulation: With patient Time For Goal Achievement: 02/07/20 Potential to Achieve Goals: Good    Frequency Min 3X/week   Barriers to discharge        Co-evaluation               AM-PAC PT "6 Clicks" Mobility  Outcome Measure Help needed turning from your back to your side while in a flat bed without using bedrails?: None Help needed moving from lying on your back to sitting on the side of a flat bed without using bedrails?: None Help needed moving to and from a bed to a chair (including a wheelchair)?: A Little Help needed standing up from a chair using your arms (e.g., wheelchair or bedside chair)?: A Little Help needed to walk in hospital room?: A Little Help needed climbing 3-5 steps with a railing? : A Little 6 Click Score: 20    End of Session Equipment Utilized During Treatment: Oxygen Activity Tolerance: Patient tolerated treatment well;Patient limited by fatigue Patient left: in chair;with call bell/phone within reach Nurse Communication: Mobility status PT Visit Diagnosis: Other abnormalities of gait and mobility  (R26.89);Muscle weakness (generalized) (M62.81)    Time: 3009-2330 PT Time Calculation (min) (ACUTE ONLY): 24 min   Charges:   PT Evaluation $PT Eval Moderate Complexity: 1 Mod PT Treatments $Therapeutic Activity: 8-22 mins   Mabeline Caras, PT, DPT Acute Rehabilitation Services  Pager [phone] Office [place] 01/24/2020, 5:26 PM

## 2020-01-24 NOTE — Progress Notes (Signed)
PROGRESS NOTE  Heather [name]  DOB: 07/18/1926  PCP: Burnard Bunting, MD POE:[id number]  DOA: 01/23/2020  LOS: 0 days   Chief Complaint  Patient presents with  . Shortness of Breath   Brief narrative: Heather [name] is a 86 y.o. female who presented to the ED on 01/23/2020 with complaint of progressively worsening shortness of breath, wheezing, cough, chest pain, thick mucus production and increasing oxygen dependence for 1 to 2 days. PMH significant for COPD, atrial fibrillation, hypertension, chronic diastolic CHF, chronic b/l lower extremity edema, osteoporosis, COVID-19 infection in October 2020. Not vaccinated.  In July 2021, she was hospitalized for a flare up of COPD by corona virus (not COVID).  In September, she completed a course of antibiotics and steroids however did not feel better.  She was getting  physical therapy at home but cannot work with them because she gets winded even on minimal exertion.   EMS found her hypoxic at mid 19s.  She was given albuterol, steroids and was brought to the ED.   In the ED, patient was afebrile, tachycardic to 107, tachypneic to 30s, blood pressure at 128/55, oxygen saturation maintained on 2 L by nasal cannula. CBC, BMP unremarkable except for slight elevated glucose 232, lactic acid, troponin negative Covid PCR negative Chest x-ray did not show acute findings but had chronic changes of COPD.  Initially treated in ED with steroids, bronchodilators.  Patient had persistent wheezing and is admitted to hospitalist service.  Subjective: Patient was seen and examined this morning. Pleasant elderly Caucasian female. Looks frail. Wheezing, coughing on deep breathing. She states she has not gotten better since her infection in July.  Assessment/Plan: Acute on chronic hypoxic respiratory failure COPD exacerbation -Presented with worsening shortness of breath, hypoxia, worsening cough -No infiltrate on chest x-ray. -She had Covid in  October 2020 and a non-COVID coronavirus infection in July 2021. -Has not felt better since then. -Her respiratory status seems to be none chronic steady decline with acute worsening now probably with viral/atypical bacterial infection. -Home meds include Albuterol nebs, Symbicort twice daily, Flonase, Robitussin for cough, -Currently on IV cefepime for Pseudomonas coverage. She is on oral prednisone this morning. I will switch her back to IV Solu-Medrol. -Sputum culture. -Continue supplemental oxygen, wean down as tolerated. -Patient follows up with Dr. Lamonte Sakai as an outpatient. Await pulmonary consultation.  Paroxysmal A. Fib -Currently in sinus rhythm with Cardizem. Continue Eliquis for anticoagulation  Essential hypertension Chronic diastolic CHF Chronic lower extremity edema, L > R -Home meds include Lasix 40 mg daily, losartan 25 mg daily, potassium supplement, magnesium supplement,  -Resume all.  Continue to monitor blood pressure. -Ordered for TED hose  Hypothyroidism -Continue Synthroid 137 mcg daily.  Osteoporosis -Outpatient management -Takes teriparatide 20 mcg injection subcu daily and calcium and vitamin D supplement.  Constipation -Continue MiraLAX daily  Mobility: PT evaluation ordered. Code Status:   Code Status: Full Code  Nutritional status: Body mass index is 17.71 kg/m.     Diet Order            Diet regular Room service appropriate? No; Fluid consistency: Thin  Diet effective now                 DVT prophylaxis: apixaban (ELIQUIS) tablet 2.5 mg Start: 01/23/20 1300 apixaban (ELIQUIS) tablet 2.5 mg   Antimicrobials:  IV cefepime Fluid: None Consultants: Pulmonary consultation called Family Communication:  Called and updated patient's daughter Ms. [name].  Status is: Observation  The patient  will require care spanning > 2 midnights and should be moved to inpatient because: Ongoing diagnostic testing needed not appropriate for outpatient  work up and IV treatments appropriate due to intensity of illness or inability to take PO  Dispo: The patient is from: Home              Anticipated d/c is to: Home              Anticipated d/c date is: 3 days              Patient currently is not medically stable to d/c.       Infusions:  . ceFEPime (MAXIPIME) IV 2 g (01/23/20 2317)    Scheduled Meds: . albuterol  2.5 mg Nebulization Once  . apixaban  2.5 mg Oral BID  . budesonide (PULMICORT) nebulizer solution  2 mg Nebulization BID  . diltiazem  180 mg Oral Daily  . furosemide  40 mg Oral Daily  . ipratropium-albuterol  3 mL Nebulization Once  . ipratropium-albuterol  3 mL Nebulization BID  . levothyroxine  137 mcg Oral QAC breakfast  . losartan  25 mg Oral Daily  . magnesium oxide  400 mg Oral Daily  . melatonin  9 mg Oral QHS  . mometasone-formoterol  2 puff Inhalation BID  . polyethylene glycol  17 g Oral Daily  . potassium chloride SA  20 mEq Oral Daily  . predniSONE  40 mg Oral Q breakfast  . [START ON 01/25/2020] Teriparatide (Recombinant)  20 mcg Subcutaneous QAC breakfast    Antimicrobials: Anti-infectives (From admission, onward)   Start     Dose/Rate Route Frequency Ordered Stop   01/23/20 1130  ceFEPIme (MAXIPIME) 2 g in sodium chloride 0.9 % 100 mL IVPB        2 g 200 mL/hr over 30 Minutes Intravenous Every 12 hours 01/23/20 1122 01/28/20 1129   01/23/20 1100  cefTRIAXone (ROCEPHIN) 1 g in sodium chloride 0.9 % 100 mL IVPB  Status:  Discontinued        1 g 200 mL/hr over 30 Minutes Intravenous  Once 01/23/20 1055 01/23/20 1124      PRN meds: acetaminophen, guaiFENesin-dextromethorphan, ipratropium-albuterol, ondansetron **OR** ondansetron (ZOFRAN) IV   Objective: Vitals:   01/24/20 0604 01/24/20 0744  BP:  119/85  Pulse:    Resp:  (!) 22  Temp:  97.8 F (36.6 C)  SpO2: 97% 96%    Intake/Output Summary (Last 24 hours) at 01/24/2020 0846 Last data filed at 01/24/2020 0555 Gross per 24 hour   Intake 680 ml  Output --  Net 680 ml   Filed Weights   01/23/20 0916  Weight: 45.4 kg   Weight change:  Body mass index is 17.71 kg/m.   Physical Exam: General exam: elderly, chronically weak, sick looking Skin: No rashes, lesions or ulcers. HEENT: Atraumatic, normocephalic, supple neck, no obvious bleeding Lungs: Mild diffuse bilateral wheezing, coughs on deep breathing CVS: Regular rate and rhythm, no murmur GI/Abd soft, nontender, nondistended, bowel sound present CNS: Alert, awake, oriented x3 Psychiatry: Mood appropriate Extremities: No pedal edema, no calf tenderness  Data Review: I have personally reviewed the laboratory data and studies available.  Recent Labs  Lab 01/23/20 0908  WBC 10.3  NEUTROABS 7.9*  HGB 13.6  HCT 41.4  MCV 95.8  PLT 240   Recent Labs  Lab 01/23/20 0908  NA 139  K 3.7  CL 99  CO2 29  GLUCOSE 132*  BUN 17  CREATININE 0.65  CALCIUM 9.1   F/u labs ordered.  Signed, Terrilee Croak, MD [facility] 01/24/2020

## 2020-01-24 NOTE — Care Management Obs Status (Signed)
[place] NOTIFICATION   Patient Details  Name: Heather [name] MRN: [id number] Date of Birth: 10-Jan-1927   Medicare Observation Status Notification Given:  Yes    Angelita Ingles, RN 01/24/2020, 11:58 AM

## 2020-01-24 NOTE — Consult Note (Signed)
NAME:  BOBBY RAGAN, MRN:  [id number], DOB:  16-Dec-1926, LOS: 0 ADMISSION DATE:  01/23/2020, CONSULTATION DATE:  01/24/20 REFERRING MD:  Terrilee Croak, MD, CHIEF COMPLAINT:  Shortness of breath   Brief History   Channa Hazelett is a 86 year old woman with history of COPD who presents with shortness of breath, wheezing, productive cough and increasing O2 needs over the past couple of days. She has been admitted for COPD exacerbation.  History of present illness   Donzella Carrol is a 86 year old woman with history of COPD who presents with shortness of breath, wheezing, productive cough and increasing O2 needs over the past couple of days.  She reports since she had a cold last month she has had continued symptoms of cough, shortness of breath and increased sputum production that has progressed over recent days. She denies fevers, chills or sweats. Influenza and Covid 19 PCR are negative. She denies any recent sick contacts.   Her home COPD regimen includes symbicort 160-4.56mcg 2 puffs twice daily and as needed duoneb neblizer treatments which she uses each morning. She procedures daily sputum and uses a flutter valve occasionally in the mornings. She as had multiple COPD exacerbations in the past year secondary to upper respiratory viral infections.   Past Medical History  COPD Atrial Fibrillation GERD Hiatal Hernia  Significant Hospital Events     Consults:  PCCM  Procedures:    Significant Diagnostic Tests:  CXR 01/24/20 The patient is rotated to the right. Stable cardiac and mediastinal contours given the rightward rotation. Atherosclerotic calcifications again noted in the transverse aorta. Persistent elevation of the posterior right hemidiaphragm, unchanged. Overall, hyperinflation with lucencies in the upper lungs compatible with emphysema. Background bronchitic changes remain stable. No focal airspace infiltrate, pleural effusion or pneumothorax. No acute osseous  abnormality  Micro Data:  Bld Cx 01/23/20 >>  Antimicrobials:  Cefepime 10/25 - 10/26 Ceftriaxone 10/26>> Azithromycin 10/26>>  Interim history/subjective:    Objective   Blood pressure 106/64, pulse 98, temperature 97.9 F (36.6 C), resp. rate (!) 26, height 5\' 3"  (1.6 m), weight 45.4 kg, SpO2 99 %.    FiO2 (%):  [36 %] 36 %   Intake/Output Summary (Last 24 hours) at 01/24/2020 1434 Last data filed at 01/24/2020 0900 Gross per 24 hour  Intake 1080 ml  Output 550 ml  Net 530 ml   Filed Weights   01/23/20 0916  Weight: 45.4 kg    Examination: General: elderly woman, no acute distress, sitting on side of bed, thin HENT: sclera anicteric, neck supple Lungs: diffuse wheezing and rhonchi.  Cardiovascular: irregularly irregular. No murmurs. Abdomen: soft, non-tender, non-distended, BS+ Extremities: no edema Neuro: moving all extremities. A&Ox3 GU: no foley in place  Resolved Hospital Problem list     Assessment & Plan:  COPD with acute exacerbation - Continue IV solumedrol 40mg  twice daily for now - Stop cefepime. Start ceftriaxone for 5 days and azithromycin for 5 days. - Stop dulera inhaler. - Start brovana nebulizer twice daily and revefenacin nebulizer once daily - Duonebs as needed every 6 hours - Continue supplemental oxygen, wean to her baseline of 2L -  Sputum culture ordered - Respiratory pathogen panel PCR ordered  She would benefit from Trelegy Ellipta or Breztri Aerosphere inhalers upon discharge as she has had repeated need for oral steroids and hospital visits for her COPD over the past year.   Will continue to follow in order to assist with steroid taper planning.  Labs   CBC: Recent Labs  Lab 01/23/20 0908  WBC 10.3  NEUTROABS 7.9*  HGB 13.6  HCT 41.4  MCV 95.8  PLT 009    Basic Metabolic Panel: Recent Labs  Lab 01/23/20 0908  NA 139  K 3.7  CL 99  CO2 29  GLUCOSE 132*  BUN 17  CREATININE 0.65  CALCIUM 9.1    GFR: Estimated Creatinine Clearance: 33.5 mL/min (by C-G formula based on SCr of 0.65 mg/dL). Recent Labs  Lab 01/23/20 0908  WBC 10.3  LATICACIDVEN 1.2    Liver Function Tests: Recent Labs  Lab 01/23/20 0908  AST 20  ALT 19  ALKPHOS 68  BILITOT 1.8*  PROT 6.0*  ALBUMIN 3.4*   No results for input(s): LIPASE, AMYLASE in the last 168 hours. No results for input(s): AMMONIA in the last 168 hours.  ABG No results found for: PHART, PCO2ART, PO2ART, HCO3, TCO2, ACIDBASEDEF, O2SAT   Coagulation Profile: Recent Labs  Lab 01/23/20 0908  INR 1.3*    Cardiac Enzymes: No results for input(s): CKTOTAL, CKMB, CKMBINDEX, TROPONINI in the last 168 hours.  HbA1C: No results found for: HGBA1C  CBG: No results for input(s): GLUCAP in the last 168 hours.  Review of Systems:   Review of Systems  Constitutional: Negative for chills, diaphoresis, fever, malaise/fatigue and weight loss.  HENT: Negative for congestion, sinus pain and sore throat.   Eyes: Negative for blurred vision.  Respiratory: Positive for cough, sputum production, shortness of breath and wheezing. Negative for hemoptysis.   Cardiovascular: Negative for chest pain, palpitations, orthopnea, claudication, leg swelling and PND.  Gastrointestinal: Negative for abdominal pain, blood in stool, constipation, diarrhea, heartburn, nausea and vomiting.  Genitourinary: Negative for dysuria and hematuria.  Musculoskeletal: Negative for joint pain and myalgias.  Skin: Negative for itching and rash.  Neurological: Negative for dizziness, weakness and headaches.  Endo/Heme/Allergies: Does not bruise/bleed easily.  Psychiatric/Behavioral: Negative.     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   Surgical History    Past Surgical History:  Procedure Laterality Date  . APPENDECTOMY  1935  . BACK SURGERY     x2  . Cardiometablic Testing  3/81/8299   submaximal effort with peak RER of 0.5, peak VO2 79% predicted; HR peak up to 78%; PVC was 55% predicted, PEV1 39% predicted; PEV1/VC ratio was reduced; normal vital capacity; DLCO was reduced to 63%  . CARPAL TUNNEL RELEASE    . CATARACT EXTRACTION W/ INTRAOCULAR LENS  IMPLANT, BILATERAL    . CHOLECYSTECTOMY  04/07/2014   dr toth  . CHOLECYSTECTOMY N/A 04/07/2014   Procedure: LAPAROSCOPIC CHOLECYSTECTOMY WITH INTRAOPERATIVE CHOLANGIOGRAM POSSBILE OPEN;  Surgeon: Autumn Messing III, MD;  Location: [place];  Service: General;  Laterality: N/A;  . COLON SURGERY  2008   partial  . ESOPHAGOGASTRODUODENOSCOPY (EGD) WITH PROPOFOL N/A 05/25/2017   Procedure: ESOPHAGOGASTRODUODENOSCOPY (EGD) WITH PROPOFOL;  Surgeon: Clarene Essex, MD;  Location: [place];  Service: Endoscopy;  Laterality: N/A;  . HERNIA REPAIR    . JOINT REPLACEMENT    . left hip relacement  2000  . SAVORY DILATION N/A 05/25/2017  Procedure: SAVORY DILATION;  Surgeon: Clarene Essex, MD;  Location: [facility] ENDOSCOPY;  Service: Endoscopy;  Laterality: N/A;  . TONSILLECTOMY  1935  . TOTAL ABDOMINAL HYSTERECTOMY  1970  . TRANSTHORACIC ECHOCARDIOGRAM  05/2011   EF=>55%; mild conc LVH; mild mitral annular calcif; mild TR; AV mildly sclerotic & mild AR  . VENTRAL HERNIA REPAIR  09/19/2011   Procedure: LAPAROSCOPIC VENTRAL HERNIA;  Surgeon: Merrie Roof, MD;  Location: [place];  Service: General;  Laterality: N/A;  laparoscopic ventral hernia repair with mesh     Social History    reports that she quit smoking about 41 years ago. Her smoking use included cigarettes. She started smoking about 61 years ago. She has a 20.00 pack-year smoking history. She has never used smokeless tobacco. She reports previous alcohol use. She reports that she does not use drugs.   Family History   Her family history includes Bladder Cancer in her brother; Diabetes in her brother; Heart block in her brother; Stroke in her brother, father, and mother.   Allergies Allergies  Allergen Reactions  . Ace Inhibitors Cough  . Sulfur Nausea And Vomiting     Home Medications  Prior to Admission medications   Medication Sig Start Date End Date Taking? Authorizing Provider  acetaminophen (TYLENOL) 325 MG tablet Take 2 tablets (650 mg total) by mouth every 6 (six) hours as needed for mild pain or headache (fever >/= 101). Patient taking differently: Take 650 mg by mouth every 6 (six) hours as needed for mild pain or headache.  01/11/19  Yes Arrien, Jimmy Picket, MD  acetaminophen (TYLENOL) 650 MG CR tablet Take 650 mg by mouth daily as needed for pain.    Yes [provider]  albuterol (PROVENTIL) (2.5 MG/3ML) 0.083% nebulizer solution Take 2.5 mg by nebulization every 6 (six) hours as needed for wheezing or shortness of breath.   Yes [provider]  albuterol (VENTOLIN HFA) 108 (90 Base) MCG/ACT inhaler Inhale 2 puffs into the lungs every 6 (six) hours as needed for wheezing or shortness of breath. 04/26/19  Yes Byrum, Rose Fillers, MD  budesonide-formoterol (SYMBICORT) 160-4.5 MCG/ACT inhaler Inhale 1 puff into the lungs 2 (two) times daily.    Yes [provider]  calcium citrate-vitamin D (CITRACAL+D) 315-200 MG-UNIT per tablet Take 1 tablet by mouth daily.   Yes [provider]  Carboxymethylcellulose Sodium (THERATEARS OP) Place 2 drops into both eyes daily.    Yes [provider]  cholecalciferol (VITAMIN D3) 25 MCG (1000 UNIT) tablet Take 1,000 Units by  mouth daily.   Yes [provider]  diltiazem (CARDIZEM CD) 180 MG 24 hr capsule Take 180 mg by mouth daily.    Yes [provider]  ELIQUIS 2.5 MG TABS tablet Take 1 tablet by mouth twice daily Patient taking differently: Take 2.5 mg by mouth 2 (two) times daily.  11/03/19  Yes Hilty, Nadean Corwin, MD  feeding supplement, ENSURE ENLIVE, (ENSURE ENLIVE) LIQD Take 237 mLs by mouth 2 (two) times daily between meals. 10/21/19  Yes Shah, Pratik D, DO  fluticasone (FLONASE) 50 MCG/ACT nasal spray Place 2 sprays into both nostrils at bedtime.  06/21/19  Yes [provider]  furosemide (LASIX) 40 MG tablet Take 40 mg by mouth daily.  11/22/19  Yes [provider]  guaiFENesin-dextromethorphan (ROBITUSSIN DM) 100-10 MG/5ML syrup Take 5 mLs by mouth every 6 (six) hours as needed for cough. Patient taking differently: Take 10 mLs by mouth every 6 (six)  hours as needed for cough.  01/11/19  Yes Arrien, Jimmy Picket, MD  KLOR-CON M20 20 MEQ tablet Take 20 mEq by mouth daily.  12/24/19  Yes [provider]  Lactobacillus Reuteri (BIOGAIA PROBIOTIC PO) Take 1 capsule by mouth daily.   Yes [provider]  levothyroxine (SYNTHROID, LEVOTHROID) 137 MCG tablet Take 137 mcg by mouth daily before breakfast.    Yes [provider]  losartan (COZAAR) 25 MG tablet Take 25 mg by mouth daily.  12/28/19  Yes [provider]  magnesium oxide (MAG-OX) 400 MG tablet Take 400 mg by mouth daily.   Yes [provider]  Melatonin 10 MG CAPS Take 10 mg by mouth at bedtime.   Yes [provider]  Multiple Vitamins-Minerals (MULTIVITAMIN WITH MINERALS) tablet Take 1 tablet by mouth daily.   Yes [provider]  polyethylene glycol (MIRALAX / GLYCOLAX) packet Take 17 g by mouth daily.    Yes [provider]  predniSONE (DELTASONE) 10 MG tablet Take 10 mg by mouth daily as needed (for breathing).  12/28/19  Yes [provider]   Teriparatide, Recombinant, (FORTEO) 620 MCG/2.48ML SOPN Inject 20 mcg into the skin daily before breakfast.    Yes [provider]     Freda Jackson, MD [facility] Office: [phone]   See Amion for Pager Details

## 2020-01-25 DIAGNOSIS — J441 Chronic obstructive pulmonary disease with (acute) exacerbation: Secondary | ICD-10-CM | POA: Diagnosis not present

## 2020-01-25 LAB — EXPECTORATED SPUTUM ASSESSMENT W GRAM STAIN, RFLX TO RESP C

## 2020-01-25 LAB — URINALYSIS, ROUTINE W REFLEX MICROSCOPIC
Bilirubin Urine: NEGATIVE
Glucose, UA: 150 mg/dL — AB
Hgb urine dipstick: NEGATIVE
Ketones, ur: NEGATIVE mg/dL
Leukocytes,Ua: NEGATIVE
Nitrite: NEGATIVE
Protein, ur: NEGATIVE mg/dL
Specific Gravity, Urine: 1.018 (ref 1.005–1.030)
pH: 6 (ref 5.0–8.0)

## 2020-01-25 MED ORDER — INFLUENZA VAC A&B SA ADJ QUAD 0.5 ML IM PRSY
0.5000 mL | PREFILLED_SYRINGE | INTRAMUSCULAR | Status: AC
  Administered 2020-01-26: 0.5 mL via INTRAMUSCULAR
  Filled 2020-01-25: qty 0.5

## 2020-01-25 MED ORDER — PNEUMOCOCCAL VAC POLYVALENT 25 MCG/0.5ML IJ INJ
0.5000 mL | INJECTION | INTRAMUSCULAR | Status: AC
  Administered 2020-01-26: 0.5 mL via INTRAMUSCULAR
  Filled 2020-01-25: qty 0.5

## 2020-01-25 MED ORDER — GUAIFENESIN-DM 100-10 MG/5ML PO SYRP
10.0000 mL | ORAL_SOLUTION | Freq: Three times a day (TID) | ORAL | Status: AC
  Administered 2020-01-25 – 2020-01-27 (×6): 10 mL via ORAL
  Filled 2020-01-25 (×6): qty 10

## 2020-01-25 NOTE — Progress Notes (Signed)
Physical Therapy Treatment Patient Details Name: Heather [name] MRN: [id number] DOB: [birth year] Today's Date: 01/25/2020    History of Present Illness Pt is a 86 y.o. female admitted 01/23/20 with SOB, cough and increasing O2 needs over past couple days; workup for COPD exacerbation. Tested (-) COVID-19. PMH includes COPD (baseline 2L O2), afib, hernia.   PT Comments    Pt progressing with mobility. Today's session focused on short activity bouts, including seated/standing ADL tasks; pt able to perform with RW at supervision-level, although still demonstrates limited activity tolerance requiring multiple rest breaks. SpO2 89-95% on RA, DOE 3/4. Pt with wet-sounding cough, but minimally productive. Will continue to follow acutely.    Follow Up Recommendations  Home health PT;Supervision - Intermittent     Equipment Recommendations  None recommended by PT    Recommendations for Other Services       Precautions / Restrictions Precautions Precautions: Fall;Other (comment) Precaution Comments: Watch SpO2 Restrictions Weight Bearing Restrictions: No    Mobility  Bed Mobility Overal bed mobility: Modified Independent             General bed mobility comments: HOB elevated  Transfers Overall transfer level: Needs assistance Equipment used: Rolling walker (2 wheeled) Transfers: Sit to/from Stand Sit to Stand: Supervision         General transfer comment: Able to stand from EOB and BSC (over toilet) to RW at supervision-level; reliant on UE support to push into standing  Ambulation/Gait Ambulation/Gait assistance: Supervision Gait Distance (Feet): 40 Feet Assistive device: Rolling walker (2 wheeled) Gait Pattern/deviations: Step-through pattern;Decreased stride length;Trunk flexed Gait velocity: Decreased   General Gait Details: Slow, fatigued gait with RW and supervision for safety/assist for lines; 1x seated break to void in bathroom; pt declining additional  distance secondary to fatigue, coughing and DOE   Stairs             Wheelchair Mobility    Modified Rankin (Stroke Patients Only)       Balance Overall balance assessment: Needs assistance   Sitting balance-Leahy Scale: Good       Standing balance-Leahy Scale: Fair Standing balance comment: Can static stand for pericare and pulling up underwear without UE support                            Cognition Arousal/Alertness: Awake/alert Behavior During Therapy: WFL for tasks assessed/performed Overall Cognitive Status: Within Functional Limits for tasks assessed                                        Exercises      General Comments General comments (skin integrity, edema, etc.): Pt received with SpO2 95% on 2L O2 at rest. SpO2 down to 89% on RA with activity; unreliable pleth via finger, therefore switched to ear probe, still inconsistent pleth, reading 89-95% on RA      Pertinent Vitals/Pain Pain Assessment: No/denies pain    Home Living                      Prior Function            PT Goals (current goals can now be found in the care plan section) Progress towards PT goals: Progressing toward goals    Frequency    Min 3X/week      PT Plan Current plan remains  appropriate    Co-evaluation              AM-PAC PT "6 Clicks" Mobility   Outcome Measure  Help needed turning from your back to your side while in a flat bed without using bedrails?: None Help needed moving from lying on your back to sitting on the side of a flat bed without using bedrails?: None Help needed moving to and from a bed to a chair (including a wheelchair)?: None Help needed standing up from a chair using your arms (e.g., wheelchair or bedside chair)?: None Help needed to walk in hospital room?: None Help needed climbing 3-5 steps with a railing? : A Little 6 Click Score: 23    End of Session Equipment Utilized During Treatment:  Oxygen Activity Tolerance: Patient tolerated treatment well;Patient limited by fatigue Patient left: with call bell/phone within reach (seated EOB to eat breakfast (reports chair to low with difficulty reaching tray/table to eat)) Nurse Communication: Mobility status PT Visit Diagnosis: Other abnormalities of gait and mobility (R26.89);Muscle weakness (generalized) (M62.81)     Time: 5379-4327 PT Time Calculation (min) (ACUTE ONLY): 21 min  Charges:  $Therapeutic Activity: 8-22 mins                     Mabeline Caras, PT, DPT Acute Rehabilitation Services  Pager [phone] Office [place] 01/25/2020, 9:14 AM

## 2020-01-25 NOTE — Progress Notes (Signed)
Physical Therapy Treatment Patient Details Name: Heather [name] MRN: [id number] DOB: 13-Aug-1926 Today's Date: 01/25/2020    History of Present Illness Pt is a 86 y.o. female admitted 01/23/20 with SOB, cough and increasing O2 needs over past couple days; workup for COPD exacerbation. Tested (-) COVID-19. PMH includes COPD (baseline 2L O2), afib, hernia.   PT Comments    Pt progressing with mobility. Pt mobilizing well, although limited by decreased activity tolerance, DOE and non-productive cough. Requires frequent seated rest breaks to recover from short bouts of mobility and ADL activities. Son present and supportive. Educ pt and son on energy conservation strategies (handout provided) and activity recommendations.  SpO2 >/88% on RA with mobility HR 90s Post-session BP 121/63    Follow Up Recommendations  Home health PT;Supervision - Intermittent     Equipment Recommendations  None recommended by PT    Recommendations for Other Services       Precautions / Restrictions Precautions Precautions: Fall;Other (comment) Precaution Comments: Watch SpO2 Restrictions Weight Bearing Restrictions: No    Mobility  Bed Mobility               General bed mobility comments: Received sitting EOB  Transfers Overall transfer level: Needs assistance Equipment used: Rolling walker (2 wheeled) Transfers: Sit to/from Stand Sit to Stand: Supervision         General transfer comment: Multiple sit<>stands from EOB and BSC (over toilet) to RW at supervision-level and assist for lines; reliane on UE support to push into standing  Ambulation/Gait Ambulation/Gait assistance: Supervision Gait Distance (Feet): 60 Feet Assistive device: Rolling walker (2 wheeled) Gait Pattern/deviations: Step-through pattern;Decreased stride length;Trunk flexed Gait velocity: Decreased   General Gait Details: Slow, labored gait with RW and supervision for safety/assist for lines; 2x seated break  secondary to fatigue and SOB; SpO2 down to 88% on RA, pt using 2L O2 to recover with SpO2 up to 99%; HR 90s   Stairs             Wheelchair Mobility    Modified Rankin (Stroke Patients Only)       Balance Overall balance assessment: Needs assistance   Sitting balance-Leahy Scale: Good       Standing balance-Leahy Scale: Fair Standing balance comment: Static stand for pericare/donning underwear, as well as prolonged standing at sink to brush teeth/wash face (seated rest break between activity bouts)                            Cognition Arousal/Alertness: Awake/alert Behavior During Therapy: WFL for tasks assessed/performed Overall Cognitive Status: Within Functional Limits for tasks assessed                                        Exercises      General Comments General comments (skin integrity, edema, etc.): Son (Chip) present and supportive; educ pt and family on importance of energy conservation strategies, especially since pt's decreased activity tolerance and DOE are main limiting factor to mobility at this point (handout provided)      Pertinent Vitals/Pain Pain Assessment: No/denies pain    Home Living                      Prior Function            PT Goals (current goals can now be found  in the care plan section) Acute Rehab PT Goals Patient Stated Goal: Return home with better breathing status PT Goal Formulation: With patient/family Time For Goal Achievement: 02/07/20 Potential to Achieve Goals: Good Progress towards PT goals: Progressing toward goals    Frequency    Min 3X/week      PT Plan Current plan remains appropriate    Co-evaluation              AM-PAC PT "6 Clicks" Mobility   Outcome Measure  Help needed turning from your back to your side while in a flat bed without using bedrails?: None Help needed moving from lying on your back to sitting on the side of a flat bed without using  bedrails?: None Help needed moving to and from a bed to a chair (including a wheelchair)?: None Help needed standing up from a chair using your arms (e.g., wheelchair or bedside chair)?: None Help needed to walk in hospital room?: None Help needed climbing 3-5 steps with a railing? : A Little 6 Click Score: 23    End of Session Equipment Utilized During Treatment: Oxygen Activity Tolerance: Patient tolerated treatment well;Patient limited by fatigue Patient left: with call bell/phone within reach;with family/visitor present (sitting EOB) Nurse Communication: Mobility status PT Visit Diagnosis: Other abnormalities of gait and mobility (R26.89);Muscle weakness (generalized) (M62.81)     Time: 8676-7209 PT Time Calculation (min) (ACUTE ONLY): 26 min  Charges:  $Therapeutic Activity: 23-37 mins                    Mabeline Caras, PT, DPT Acute Rehabilitation Services  Pager [phone] Office [place] 01/25/2020, 3:39 PM

## 2020-01-25 NOTE — Plan of Care (Signed)

## 2020-01-25 NOTE — Plan of Care (Signed)

## 2020-01-25 NOTE — Progress Notes (Signed)
PROGRESS NOTE  Heather [name]  DOB: 01/31/1927  PCP: Burnard Bunting, MD HFW:[id number]  DOA: 01/23/2020  LOS: 1 day   Chief Complaint  Patient presents with  . Shortness of Breath   Brief narrative: Heather [name] is a 86 y.o. female who presented to the ED on 01/23/2020 with complaint of progressively worsening shortness of breath, wheezing, cough, chest pain, thick mucus production and increasing oxygen dependence for 1 to 2 days. PMH significant for COPD, atrial fibrillation, hypertension, chronic diastolic CHF, chronic b/l lower extremity edema, osteoporosis, COVID-19 infection in October 2020. Not vaccinated.  In July 2021, she was hospitalized for a flare up of COPD by corona virus (not COVID).  In September, she completed a course of antibiotics and steroids however did not feel better.  She was getting  physical therapy at home but cannot work with them because she gets winded even on minimal exertion.   EMS found her hypoxic at mid 29s.  She was given albuterol, steroids and was brought to the ED.   In the ED, patient was afebrile, tachycardic to 107, tachypneic to 30s, blood pressure at 128/55, oxygen saturation maintained on 2 L by nasal cannula. CBC, BMP unremarkable except for slight elevated glucose 232, lactic acid, troponin negative Covid PCR negative Chest x-ray did not show acute findings but had chronic changes of COPD.  Initially treated in ED with steroids, bronchodilators.  Patient had persistent wheezing and is admitted to hospitalist service.  Subjective: Patient was seen and examined this afternoon. Sitting up at the edge of the bed.  Was able to ambulate with PT yesterday. On 2 L oxygen by nasal cannula, oxygen saturation more than 95%. Patient feels that she is not able to gain enough phlegm out.  Assessment/Plan: Acute on chronic hypoxic respiratory failure COPD exacerbation -Presented with worsening shortness of breath, hypoxia, worsening  cough -No infiltrate on chest x-ray. -She had Covid in October 2020 and a non-COVID coronavirus infection in July 2021. -Has not felt better since then. -Her respiratory status seems to be none chronic steady decline with acute worsening now probably with viral/atypical bacterial infection. -Pulmonary consultation appreciated.  Patient is currently improving on IV Rocephin and IV azithromycin as well as IV steroids. -On bronchodilators per pulmonary. -Patient complained this morning his inability to bring up phlegm.  I will switch from as needed to scheduled Robitussin-DM q8 hours. -Continue supplemental oxygen, wean down as tolerated. -Patient follows up with Dr. Lamonte Sakai as an outpatient.   Paroxysmal A. Fib -Currently in sinus rhythm with Cardizem. Continue Eliquis for anticoagulation  Essential hypertension Chronic diastolic CHF Chronic lower extremity edema, L > R -Home meds include Lasix 40 mg daily, losartan 25 mg daily, potassium supplement, magnesium supplement,  -Resume all.  Continue to monitor blood pressure. -Ordered for TED hose  Hypothyroidism -Continue Synthroid 137 mcg daily.  Osteoporosis -Outpatient management -Takes teriparatide 20 mcg injection subcu daily and calcium and vitamin D supplement.  Constipation -Continue MiraLAX daily  Mobility: PT evaluation obtained. Code Status:   Code Status: Full Code  Nutritional status: Body mass index is 17.71 kg/m.     Diet Order            Diet regular Room service appropriate? No; Fluid consistency: Thin  Diet effective now                 DVT prophylaxis: apixaban (ELIQUIS) tablet 2.5 mg Start: 01/23/20 1300 apixaban (ELIQUIS) tablet 2.5 mg   Antimicrobials:  IV  cefepime Fluid: None Consultants: Pulmonary  Family Communication:  Discussed with patient's daughter Ms. [name] on 10/26.  Status is: Inpatient  Remains inpatient appropriate because: Feels obstructed, easily desaturates on ambulation,  does not feel ready to go.  Dispo: The patient is from: Home              Anticipated d/c is to: Home              Anticipated d/c date is: 1-2 days              Patient currently is not medically stable to d/c.             Infusions:  . cefTRIAXone (ROCEPHIN)  IV Stopped (01/24/20 2115)    Scheduled Meds: . apixaban  2.5 mg Oral BID  . arformoterol  15 mcg Nebulization BID  . azithromycin  250 mg Oral Daily  . diltiazem  180 mg Oral Daily  . furosemide  40 mg Oral Daily  . [START ON 01/26/2020] influenza vaccine adjuvanted  0.5 mL Intramuscular Tomorrow-1000  . levothyroxine  137 mcg Oral QAC breakfast  . losartan  25 mg Oral Daily  . magnesium oxide  400 mg Oral Daily  . melatonin  9 mg Oral QHS  . methylPREDNISolone (SOLU-MEDROL) injection  40 mg Intravenous Q12H  . [START ON 01/26/2020] pneumococcal 23 valent vaccine  0.5 mL Intramuscular Tomorrow-1000  . polyethylene glycol  17 g Oral Daily  . potassium chloride SA  20 mEq Oral Daily  . revefenacin  175 mcg Nebulization Daily    Antimicrobials: Anti-infectives (From admission, onward)   Start     Dose/Rate Route Frequency Ordered Stop   01/25/20 1000  azithromycin (ZITHROMAX) tablet 250 mg       "Followed by" Linked Group Details   250 mg Oral Daily 01/24/20 1418 01/29/20 0959   01/24/20 2000  cefTRIAXone (ROCEPHIN) 1 g in sodium chloride 0.9 % 100 mL IVPB        1 g 200 mL/hr over 30 Minutes Intravenous Every 24 hours 01/24/20 1418 01/29/20 1959   01/24/20 1500  azithromycin (ZITHROMAX) tablet 500 mg       "Followed by" Linked Group Details   500 mg Oral Daily 01/24/20 1418 01/24/20 1655   01/23/20 1130  ceFEPIme (MAXIPIME) 2 g in sodium chloride 0.9 % 100 mL IVPB  Status:  Discontinued        2 g 200 mL/hr over 30 Minutes Intravenous Every 12 hours 01/23/20 1122 01/24/20 1418   01/23/20 1100  cefTRIAXone (ROCEPHIN) 1 g in sodium chloride 0.9 % 100 mL IVPB  Status:  Discontinued        1 g 200 mL/hr over  30 Minutes Intravenous  Once 01/23/20 1055 01/23/20 1124      PRN meds: acetaminophen, guaiFENesin-dextromethorphan, ipratropium-albuterol, ondansetron **OR** ondansetron (ZOFRAN) IV   Objective: Vitals:   01/25/20 0808 01/25/20 1201  BP:    Pulse:    Resp:    Temp: 98.4 F (36.9 C) 97.9 F (36.6 C)  SpO2:      Intake/Output Summary (Last 24 hours) at 01/25/2020 1347 Last data filed at 01/25/2020 0603 Gross per 24 hour  Intake 520 ml  Output --  Net 520 ml   Filed Weights   01/23/20 0916  Weight: 45.4 kg   Weight change:  Body mass index is 17.71 kg/m.   Physical Exam: General exam: Thin built. elderly, chronically weak looking.  Sitting up at the edge of  the bed Skin: No rashes, lesions or ulcers. HEENT: Atraumatic, normocephalic, supple neck, no obvious bleeding Lungs: Continues to have an expiratory wheezing bilateral, coughs on deep breathing CVS: Regular rate and rhythm, no murmur GI/Abd soft, nontender, nondistended, bowel sound present CNS: Alert, awake, oriented x3 Psychiatry: Mood appropriate Extremities: No pedal edema, no calf tenderness  Data Review: I have personally reviewed the laboratory data and studies available.  Recent Labs  Lab 01/23/20 0908  WBC 10.3  NEUTROABS 7.9*  HGB 13.6  HCT 41.4  MCV 95.8  PLT 240   Recent Labs  Lab 01/23/20 0908  NA 139  K 3.7  CL 99  CO2 29  GLUCOSE 132*  BUN 17  CREATININE 0.65  CALCIUM 9.1   F/u labs ordered.  Signed, Terrilee Croak, MD [facility] 01/25/2020

## 2020-01-25 NOTE — Progress Notes (Signed)
NAME:  Heather [name], MRN:  [id number], DOB:  07-22-1926, LOS: 1 ADMISSION DATE:  01/23/2020, CONSULTATION DATE:  01/24/20 REFERRING MD:  Terrilee Croak, MD, CHIEF COMPLAINT:  Shortness of breath   Brief History   Heather [name] is a 86 year old woman with history of COPD who presents with shortness of breath, wheezing, productive cough and increasing O2 needs over the past couple of days. She has been admitted for COPD exacerbation.  History of present illness   Heather [name] is a 86 year old woman with history of COPD who presents with shortness of breath, wheezing, productive cough and increasing O2 needs over the past couple of days.  She reports since she had a cold last month she has had continued symptoms of cough, shortness of breath and increased sputum production that has progressed over recent days. She denies fevers, chills or sweats. Influenza and Covid 19 PCR are negative. She denies any recent sick contacts.   Her home COPD regimen includes symbicort 160-4.90mcg 2 puffs twice daily and as needed duoneb neblizer treatments which she uses each morning. She procedures daily sputum and uses a flutter valve occasionally in the mornings. She as had multiple COPD exacerbations in the past year secondary to upper respiratory viral infections.   Past Medical History  COPD Atrial Fibrillation GERD Hiatal Hernia  Significant Hospital Events     Consults:  PCCM  Procedures:    Significant Diagnostic Tests:  CXR 01/24/20 The patient is rotated to the right. Stable cardiac and mediastinal contours given the rightward rotation. Atherosclerotic calcifications again noted in the transverse aorta. Persistent elevation of the posterior right hemidiaphragm, unchanged. Overall, hyperinflation with lucencies in the upper lungs compatible with emphysema. Background bronchitic changes remain stable. No focal airspace infiltrate, pleural effusion or pneumothorax. No acute osseous  abnormality  Micro Data:  Bld Cx 01/23/20 >> Resp Viral Panel 01/24/20 - Negative Sputum Culture 10/25 >> Antimicrobials:  Cefepime 10/25 - 10/26 Ceftriaxone 10/26>> Azithromycin 10/26>>  Interim history/subjective:   No acute events overnight.   Objective   Blood pressure 124/62, pulse 97, temperature 98.4 F (36.9 C), resp. rate (!) 21, height 5\' 3"  (1.6 m), weight 45.4 kg, SpO2 93 %.    FiO2 (%):  [24 %] 24 %   Intake/Output Summary (Last 24 hours) at 01/25/2020 0828 Last data filed at 01/25/2020 0603 Gross per 24 hour  Intake 1020 ml  Output 550 ml  Net 470 ml   Filed Weights   01/23/20 0916  Weight: 45.4 kg    Examination: General: elderly woman, no acute distress, sitting on side of bed, thin HENT: sclera anicteric, neck supple Lungs: diffuse wheezing and rhonchi.  Cardiovascular: irregularly irregular. No murmurs. Abdomen: soft, non-tender, non-distended, BS+ Extremities: no edema Neuro: moving all extremities. A&Ox3 GU: no foley in place  Resolved Hospital Problem list     Assessment & Plan:  COPD with acute exacerbation - Continue IV solumedrol 40mg  twice daily today - Tomorrow start 60mg  prednisone daily for 4 days, then 40mg  of prednisone for 4 days, then 20 mg of prednisone for 4 days, then 10mg  for 4 days and stop  -  Sputum culture pending. Respiratory pathogen panel PCR is negative. Procalcitonin is quite elevated - Ceftriaxone for 5 days and azithromycin for 5 days. - Continue brovana nebulizer twice daily and revefenacin nebulizer once daily - Duonebs as needed every 6 hours - Continue supplemental oxygen, wean to her baseline of 2L  She would benefit from Trelegy  Ellipta or Breztri Aerosphere inhalers upon discharge as she has had repeated need for oral steroids and hospital visits for her COPD over the past year. If her insurance will cover these inhalers, then she would discontinue the Symbicort. If insurance does not cover Trelegy or  Breztri, then spiriva or incruse should be added to her daily therapy with symbicort.   She has follow up appoitment on 02/21/20 at 3:30pm with Dr. Lamonte Sakai in clinic.   Pulmonary service will sign off but please call if any further questions  Labs   CBC: Recent Labs  Lab 01/23/20 0908  WBC 10.3  NEUTROABS 7.9*  HGB 13.6  HCT 41.4  MCV 95.8  PLT 779    Basic Metabolic Panel: Recent Labs  Lab 01/23/20 0908  NA 139  K 3.7  CL 99  CO2 29  GLUCOSE 132*  BUN 17  CREATININE 0.65  CALCIUM 9.1   GFR: Estimated Creatinine Clearance: 33.5 mL/min (by C-G formula based on SCr of 0.65 mg/dL). Recent Labs  Lab 01/23/20 0908 01/24/20 1555  PROCALCITON  --  27.98  WBC 10.3  --   LATICACIDVEN 1.2  --     Liver Function Tests: Recent Labs  Lab 01/23/20 0908  AST 20  ALT 19  ALKPHOS 68  BILITOT 1.8*  PROT 6.0*  ALBUMIN 3.4*   No results for input(s): LIPASE, AMYLASE in the last 168 hours. No results for input(s): AMMONIA in the last 168 hours.  ABG No results found for: PHART, PCO2ART, PO2ART, HCO3, TCO2, ACIDBASEDEF, O2SAT   Coagulation Profile: Recent Labs  Lab 01/23/20 0908  INR 1.3*    Freda Jackson, MD [facility] Office: [phone]   See Amion for Pager Details

## 2020-01-26 LAB — URINE CULTURE: Culture: 7000 — AB

## 2020-01-26 NOTE — Plan of Care (Signed)
Pt is well informed about her health and she relates an improvement. Pt is having a productive cough and had a bowel movement today. She moves around in bed and uses BSC.

## 2020-01-26 NOTE — Progress Notes (Addendum)
PROGRESS NOTE  Heather [name]  DOB: 1926/07/25  PCP: Burnard Bunting, MD AUQ:[id number]  DOA: 01/23/2020  LOS: 2 days   Chief Complaint  Patient presents with  . Shortness of Breath   Brief narrative: Heather [name] is a 86 y.o. female who presented to the ED on 01/23/2020 with complaint of progressively worsening shortness of breath, wheezing, cough, chest pain, thick mucus production and increasing oxygen dependence for 1 to 2 days. PMH significant for COPD, atrial fibrillation, hypertension, chronic diastolic CHF, chronic b/l lower extremity edema, osteoporosis, COVID-19 infection in October 2020. Not vaccinated.  In July 2021, she was hospitalized for a flare up of COPD by corona virus (not COVID).  In September, she completed a course of antibiotics and steroids however did not feel better.  She was getting  physical therapy at home but cannot work with them because she gets winded even on minimal exertion.   EMS found her hypoxic at mid 1s.  She was given albuterol, steroids and was brought to the ED.   In the ED, patient was afebrile, tachycardic to 107, tachypneic to 30s, blood pressure at 128/55, oxygen saturation maintained on 2 L by nasal cannula. CBC, BMP unremarkable except for slight elevated glucose 232, lactic acid, troponin negative Covid PCR negative Chest x-ray did not show acute findings but had chronic changes of COPD.  Initially treated in ED with steroids, bronchodilators.  Patient had persistent wheezing and is admitted to hospitalist service.  Subjective: Patient was seen and examined this morning. Propped up in bed.  Struggling to bring up phlegm.  On 2 L oxygen by nasal cannula able to maintain saturation more than 90%. Patient however is struggling to bring up phlegm.  She feels it easier than yesterday.  Does not feel ready to go home.  Assessment/Plan: Acute on chronic hypoxic respiratory failure COPD exacerbation -Presented with worsening  shortness of breath, hypoxia, worsening cough -No infiltrate on chest x-ray. -Admitted for acute worsening of terminal COPD probably with viral/atypical bacterial infection. -Pulmonary consultation appreciated.  Patient is currently improving on IV Rocephin and IV azithromycin as well as IV steroids. -On bronchodilators per pulmonary. -Her biggest complaint is inability to bring up phlegm.  She feels a little better today with scheduled Robitussin-DM q8 hours started yesterday. -Continue supplemental oxygen, wean down as tolerated. -Hoping to discharge home tomorrow on oral antibiotics and long course of steroids. -Patient follows up with Dr. Lamonte Sakai as an outpatient.   Paroxysmal A. Fib -Currently in sinus rhythm with Cardizem. Continue Eliquis for anticoagulation  Essential hypertension Chronic diastolic CHF Chronic lower extremity edema, L > R -Home meds include Lasix 40 mg daily, losartan 25 mg daily, potassium supplement, magnesium supplement,  -Continue all.  Continue to monitor blood pressure. -Ordered for TED hose  Hypothyroidism -Continue Synthroid 137 mcg daily.  Osteoporosis -Outpatient management -Takes teriparatide 20 mcg injection subcu daily and calcium and vitamin D supplement.  Constipation -Continue MiraLAX daily  MRSE in urine -Urine culture obtained on admission grew 7000 CFU per mL of multidrug resistant Staphylococcus epidermidis -Patient is asymptomatic.  I would considered this as contaminant  Mobility: PT evaluation obtained. Code Status:   Code Status: Full Code  Nutritional status: Body mass index is 17.71 kg/m.     Diet Order            Diet regular Room service appropriate? No; Fluid consistency: Thin  Diet effective now  DVT prophylaxis: apixaban (ELIQUIS) tablet 2.5 mg Start: 01/23/20 1300 apixaban (ELIQUIS) tablet 2.5 mg   Antimicrobials:  IV cefepime Fluid: None Consultants: Pulmonary  Family Communication:   Discussed with patient's daughter Ms. [name] on 10/26.  Status is: Inpatient  Remains inpatient appropriate because:IV treatments appropriate due to intensity of illness or inability to take PO.  High risk of readmission  Dispo: The patient is from: Home              Anticipated d/c is to: Home              Anticipated d/c date is: Tomorrow              Patient currently is not medically stable to d/c.       Infusions:  . cefTRIAXone (ROCEPHIN)  IV 1 g (01/25/20 2000)    Scheduled Meds: . apixaban  2.5 mg Oral BID  . arformoterol  15 mcg Nebulization BID  . azithromycin  250 mg Oral Daily  . diltiazem  180 mg Oral Daily  . furosemide  40 mg Oral Daily  . guaiFENesin-dextromethorphan  10 mL Oral Q8H  . levothyroxine  137 mcg Oral QAC breakfast  . losartan  25 mg Oral Daily  . magnesium oxide  400 mg Oral Daily  . melatonin  9 mg Oral QHS  . methylPREDNISolone (SOLU-MEDROL) injection  40 mg Intravenous Q12H  . polyethylene glycol  17 g Oral Daily  . potassium chloride SA  20 mEq Oral Daily  . revefenacin  175 mcg Nebulization Daily    Antimicrobials: Anti-infectives (From admission, onward)   Start     Dose/Rate Route Frequency Ordered Stop   01/25/20 1000  azithromycin (ZITHROMAX) tablet 250 mg       "Followed by" Linked Group Details   250 mg Oral Daily 01/24/20 1418 01/29/20 0959   01/24/20 2000  cefTRIAXone (ROCEPHIN) 1 g in sodium chloride 0.9 % 100 mL IVPB        1 g 200 mL/hr over 30 Minutes Intravenous Every 24 hours 01/24/20 1418 01/29/20 1959   01/24/20 1500  azithromycin (ZITHROMAX) tablet 500 mg       "Followed by" Linked Group Details   500 mg Oral Daily 01/24/20 1418 01/24/20 1655   01/23/20 1130  ceFEPIme (MAXIPIME) 2 g in sodium chloride 0.9 % 100 mL IVPB  Status:  Discontinued        2 g 200 mL/hr over 30 Minutes Intravenous Every 12 hours 01/23/20 1122 01/24/20 1418   01/23/20 1100  cefTRIAXone (ROCEPHIN) 1 g in sodium chloride 0.9 % 100 mL IVPB   Status:  Discontinued        1 g 200 mL/hr over 30 Minutes Intravenous  Once 01/23/20 1055 01/23/20 1124      PRN meds: acetaminophen, ipratropium-albuterol, ondansetron **OR** ondansetron (ZOFRAN) IV   Objective: Vitals:   01/26/20 0810 01/26/20 0811  BP:    Pulse:    Resp:    Temp:    SpO2: 95% 95%    Intake/Output Summary (Last 24 hours) at 01/26/2020 1211 Last data filed at 01/26/2020 1000 Gross per 24 hour  Intake 240 ml  Output --  Net 240 ml   Filed Weights   01/23/20 0916  Weight: 45.4 kg   Weight change:  Body mass index is 17.71 kg/m.   Physical Exam: General exam: Thin built.  Propped up in bed.  Struggling to bring up phlegm Skin: No rashes, lesions or ulcers. HEENT:  Atraumatic, normocephalic, supple neck, no obvious bleeding Lungs: Mild expiratory wheezing bilaterally. CVS: Regular rate and rhythm, no murmur GI/Abd soft, nontender, nondistended, bowel sound present CNS: Alert, awake, oriented x3 Psychiatry: Mood appropriate Extremities: No pedal edema, no calf tenderness  Data Review: I have personally reviewed the laboratory data and studies available.  Recent Labs  Lab 01/23/20 0908  WBC 10.3  NEUTROABS 7.9*  HGB 13.6  HCT 41.4  MCV 95.8  PLT 240   Recent Labs  Lab 01/23/20 0908  NA 139  K 3.7  CL 99  CO2 29  GLUCOSE 132*  BUN 17  CREATININE 0.65  CALCIUM 9.1   F/u labs ordered.  Signed, Terrilee Croak, MD [facility] 01/26/2020

## 2020-01-27 LAB — CULTURE, RESPIRATORY W GRAM STAIN: Culture: NORMAL

## 2020-01-27 MED ORDER — CEFDINIR 300 MG PO CAPS: 300.0000 mg | ORAL_CAPSULE | Freq: Two times a day (BID) | ORAL | 0 refills | Status: AC

## 2020-01-27 MED ORDER — PREDNISONE 10 MG PO TABS: ORAL_TABLET | ORAL | 0 refills | Status: DC

## 2020-01-27 NOTE — Discharge Instructions (Signed)
Chronic Obstructive Pulmonary Disease  Chronic obstructive pulmonary disease (COPD) is a long-term (chronic) condition that affects the lungs. COPD is a general term that can be used to describe many different lung problems that cause lung swelling (inflammation) and limit airflow, including chronic bronchitis and emphysema. If you have COPD, your lung function will probably never return to normal. In most cases, it gets worse over time. However, there are steps you can take to slow the progression of the disease and improve your quality of life. What are the causes? This condition may be caused by:  Smoking. This is the most common cause.  Certain genes passed down through families. What increases the risk? The following factors may make you more likely to develop this condition:  Secondhand smoke from cigarettes, pipes, or cigars.  Exposure to chemicals and other irritants such as fumes and dust in the work environment.  Chronic lung conditions or infections. What are the signs or symptoms? Symptoms of this condition include:  Shortness of breath, especially during physical activity.  Chronic cough with a large amount of thick mucus. Sometimes the cough may not have any mucus (dry cough).  Wheezing.  Rapid breaths.  Gray or bluish discoloration (cyanosis) of the skin, especially in your fingers, toes, or lips.  Feeling tired (fatigue).  Weight loss.  Chest tightness.  Frequent infections.  Episodes when breathing symptoms become much worse (exacerbations).  Swelling in the ankles, feet, or legs. This may occur in later stages of the disease. How is this diagnosed? This condition is diagnosed based on:  Your medical history.  A physical exam. You may also have tests, including:  Lung (pulmonary) function tests. This may include a spirometry test, which measures your ability to exhale properly.  Chest X-ray.  CT scan.  Blood tests. How is this treated? This  condition may be treated with:  Medicines. These may include inhaled rescue medicines to treat acute exacerbations as well as long-term, or maintenance, medicines to prevent flare-ups of COPD. ? Bronchodilators help treat COPD by dilating the airways to allow increased airflow and make your breathing more comfortable. ? Steroids can reduce airway inflammation and help prevent exacerbations.  Smoking cessation. If you smoke, your health care provider may ask you to quit, and may also recommend therapy or replacement products to help you quit.  Pulmonary rehabilitation. This may involve working with a team of health care providers and specialists, such as respiratory, occupational, and physical therapists.  Exercise and physical activity. These are beneficial for nearly all people with COPD.  Nutrition therapy to gain weight, if you are underweight.  Oxygen. Supplemental oxygen therapy is only helpful if you have a low oxygen level in your blood (hypoxemia).  Lung surgery or transplant.  Palliative care. This is to help people with COPD feel comfortable when treatment is no longer working. Follow these instructions at home: Medicines  Take over-the-counter and prescription medicines (inhaled or pills) only as told by your health care provider.  Talk to your health care provider before taking any cough or allergy medicines. You may need to avoid certain medicines that dry out your airways. Lifestyle  If you are a smoker, the most important thing that you can do is to stop smoking. Do not use any products that contain nicotine or tobacco, such as cigarettes and e-cigarettes. If you need help quitting, ask your health care provider. Continuing to smoke will cause the disease to progress faster.  Avoid exposure to things that irritate your  lungs, such as smoke, chemicals, and fumes.  Stay active, but balance activity with periods of rest. Exercise and physical activity will help you maintain  your ability to do things you want to do.  Learn and use relaxation techniques to manage stress and to control your breathing.  Get the right amount of sleep and get quality sleep. Most adults need 7 or more hours per night.  Eat healthy foods. Eating smaller, more frequent meals and resting before meals may help you maintain your strength. Controlled breathing Learn and use controlled breathing techniques as directed by your health care provider. Controlled breathing techniques include:  Pursed lip breathing. Start by breathing in (inhaling) through your nose for 1 second. Then, purse your lips as if you were going to whistle and breathe out (exhale) through the pursed lips for 2 seconds.  Diaphragmatic breathing. Start by putting one hand on your abdomen just above your waist. Inhale slowly through your nose. The hand on your abdomen should move out. Then purse your lips and exhale slowly. You should be able to feel the hand on your abdomen moving in as you exhale. Controlled coughing Learn and use controlled coughing to clear mucus from your lungs. Controlled coughing is a series of short, progressive coughs. The steps of controlled coughing are: 1. Lean your head slightly forward. 2. Breathe in deeply using diaphragmatic breathing. 3. Try to hold your breath for 3 seconds. 4. Keep your mouth slightly open while coughing twice. 5. Spit any mucus out into a tissue. 6. Rest and repeat the steps once or twice as needed. General instructions  Make sure you receive all the vaccines that your health care provider recommends, especially the pneumococcal and influenza vaccines. Preventing infection and hospitalization is very important when you have COPD.  Use oxygen therapy and pulmonary rehabilitation if directed to by your health care provider. If you require home oxygen therapy, ask your health care provider whether you should purchase a pulse oximeter to measure your oxygen level at  home.  Work with your health care provider to develop a COPD action plan. This will help you know what steps to take if your condition gets worse.  Keep other chronic health conditions under control as told by your health care provider.  Avoid extreme temperature and humidity changes.  Avoid contact with people who have an illness that spreads from person to person (is contagious), such as viral infections or pneumonia.  Keep all follow-up visits as told by your health care provider. This is important. Contact a health care provider if:  You are coughing up more mucus than usual.  There is a change in the color or thickness of your mucus.  Your breathing is more labored than usual.  Your breathing is faster than usual.  You have difficulty sleeping.  You need to use your rescue medicines or inhalers more often than expected.  You have trouble doing routine activities such as getting dressed or walking around the house. Get help right away if:  You have shortness of breath while you are resting.  You have shortness of breath that prevents you from: ? Being able to talk. ? Performing your usual physical activities.  You have chest pain lasting longer than 5 minutes.  Your skin color is more blue (cyanotic) than usual.  You measure low oxygen saturations for longer than 5 minutes with a pulse oximeter.  You have a fever.  You feel too tired to breathe normally. Summary  Chronic obstructive  pulmonary disease (COPD) is a long-term (chronic) condition that affects the lungs.  Your lung function will probably never return to normal. In most cases, it gets worse over time. However, there are steps you can take to slow the progression of the disease and improve your quality of life.  Treatment for COPD may include taking medicines, quitting smoking, pulmonary rehabilitation, and changes to diet and exercise. As the disease progresses, you may need oxygen therapy, a lung  transplant, or palliative care.  To help manage your condition, do not smoke, avoid exposure to things that irritate your lungs, stay up to date on all vaccines, and follow your health care provider's instructions for taking medicines. This information is not intended to replace advice given to you by your health care provider. Make sure you discuss any questions you have with your health care provider. Document Revised: 02/27/2017 Document Reviewed: 04/21/2016 Elsevier Patient Education  [employer].     COPD and Physical Activity Chronic obstructive pulmonary disease (COPD) is a long-term (chronic) condition that affects the lungs. COPD is a general term that can be used to describe many different lung problems that cause lung swelling (inflammation) and limit airflow, including chronic bronchitis and emphysema. The main symptom of COPD is shortness of breath, which makes it harder to do even simple tasks. This can also make it harder to exercise and be active. Talk with your health care provider about treatments to help you breathe better and actions you can take to prevent breathing problems during physical activity. What are the benefits of exercising with COPD? Exercising regularly is an important part of a healthy lifestyle. You can still exercise and do physical activities even though you have COPD. Exercise and physical activity improve your shortness of breath by increasing blood flow (circulation). This causes your heart to pump more oxygen through your body. Moderate exercise can improve your:  Oxygen use.  Energy level.  Shortness of breath.  Strength in your breathing muscles.  Heart health.  Sleep.  Self-esteem and feelings of self-worth.  Depression, stress, and anxiety levels. Exercise can benefit everyone with COPD. The severity of your disease may affect how hard you can exercise, especially at first, but everyone can benefit. Talk with your health care  provider about how much exercise is safe for you, and which activities and exercises are safe for you. What actions can I take to prevent breathing problems during physical activity?  Sign up for a pulmonary rehabilitation program. This type of program may include: ? Education about lung diseases. ? Exercise classes that teach you how to exercise and be more active while improving your breathing. This usually involves:  Exercise using your lower extremities, such as a stationary bicycle.  About 30 minutes of exercise, 2 to 5 times per week, for 6 to 12 weeks  Strength training, such as push ups or leg lifts. ? Nutrition education. ? Group classes in which you can talk with others who also have COPD and learn ways to manage stress.  If you use an oxygen tank, you should use it while you exercise. Work with your health care provider to adjust your oxygen for your physical activity. Your resting flow rate is different from your flow rate during physical activity.  While you are exercising: ? Take slow breaths. ? Pace yourself and do not try to go too fast. ? Purse your lips while breathing out. Pursing your lips is similar to a kissing or whistling position. ? If  doing exercise that uses a quick burst of effort, such as weight lifting:  Breathe in before starting the exercise.  Breathe out during the hardest part of the exercise (such as raising the weights). Where to find support You can find support for exercising with COPD from:  Your health care provider.  A pulmonary rehabilitation program.  Your local health department or community health programs.  Support groups, online or in-person. Your health care provider may be able to recommend support groups. Where to find more information You can find more information about exercising with COPD from:  American Lung Association: [employer]Ins[employer].se.  COPD Foundation: https://www.rivera.net/. Contact a health care provider if:  Your symptoms  get worse.  You have chest pain.  You have nausea.  You have a fever.  You have trouble talking or catching your breath.  You want to start a new exercise program or a new activity. Summary  COPD is a general term that can be used to describe many different lung problems that cause lung swelling (inflammation) and limit airflow. This includes chronic bronchitis and emphysema.  Exercise and physical activity improve your shortness of breath by increasing blood flow (circulation). This causes your heart to provide more oxygen to your body.  Contact your health care provider before starting any exercise program or new activity. Ask your health care provider what exercises and activities are safe for you. This information is not intended to replace advice given to you by your health care provider. Make sure you discuss any questions you have with your health care provider. Document Revised: 07/07/2018 Document Reviewed: 04/09/2017 Elsevier Patient Education  2020 [employer].

## 2020-01-27 NOTE — Progress Notes (Signed)
Physical Therapy Treatment Patient Details Name: Heather [name] MRN: [id number] DOB: 11-02-1926 Today's Date: 01/27/2020    History of Present Illness Pt is a 86 y.o. female admitted 01/23/20 with SOB, cough and increasing O2 needs over past couple days; workup for COPD exacerbation. Tested (-) COVID-19. PMH includes COPD (baseline 2L O2), afib, hernia.    PT Comments    Patient reports feeling tired today; has been up since 3am moving around and coughing. Reports having more difficulty coughing up phlegm today. Tolerated standing bouts, there ex and SPT to chair with use of RW. Worked on coughing techniques sitting EOB to improve production. Discussed some strengthening ideas and exercises for home especially as pt reports feeling weak. VSS on 2L/min 02 [place]. 2/4 DOE noted during session. Will follow if still int he hospital.   Follow Up Recommendations  Home health PT;Supervision - Intermittent     Equipment Recommendations  None recommended by PT    Recommendations for Other Services       Precautions / Restrictions Precautions Precautions: Fall;Other (comment) Precaution Comments: Watch SpO2 Restrictions Weight Bearing Restrictions: No    Mobility  Bed Mobility Overal bed mobility: Modified Independent             General bed mobility comments: HOB elevated, use of rail.  Transfers Overall transfer level: Needs assistance Equipment used: Rolling walker (2 wheeled) Transfers: Sit to/from [facility] Sit to Stand: Supervision Stand pivot transfers: Min guard       General transfer comment: Supervision for safety. Stood from [employer]. Reliant on UEs to push into standing. Able to perform SPT to chair with Min guard assist to manage lines.  Ambulation/Gait             General Gait Details: Declined as she has been up since 3 am this morning   Stairs             Wheelchair Mobility    Modified Rankin (Stroke Patients Only)        Balance Overall balance assessment: Needs assistance Sitting-balance support: Feet supported;No upper extremity supported Sitting balance-Leahy Scale: Good Sitting balance - Comments: Worked on coughing techniques sitting EOB to try to cough up phlegm.     Standing balance-Leahy Scale: Poor Standing balance comment: Requires Ue support for dynamic tasks.                            Cognition Arousal/Alertness: Awake/alert Behavior During Therapy: WFL for tasks assessed/performed Overall Cognitive Status: Within Functional Limits for tasks assessed                                 General Comments: WFL for simple tasks; not formally assessed      Exercises General Exercises - Lower Extremity Ankle Circles/Pumps: AROM;Both;5 reps;Seated Long Arc Quad: AROM;Both;5 reps;Seated Other Exercises Other Exercises: Instructed pt to stand from chair multiple times prior to getting up as a strengthening exercise at home    General Comments General comments (skin integrity, edema, etc.): Pt excited about going home today.      Pertinent Vitals/Pain Pain Assessment: No/denies pain    Home Living                      Prior Function            PT Goals (current goals can now  be found in the care plan section) Progress towards PT goals: Not progressing toward goals - comment (tired, going home today, been up all morning moving around.)    Frequency    Min 3X/week      PT Plan Current plan remains appropriate    Co-evaluation              AM-PAC PT "6 Clicks" Mobility   Outcome Measure  Help needed turning from your back to your side while in a flat bed without using bedrails?: None Help needed moving from lying on your back to sitting on the side of a flat bed without using bedrails?: None Help needed moving to and from a bed to a chair (including a wheelchair)?: A Little Help needed standing up from a chair using your arms  (e.g., wheelchair or bedside chair)?: None Help needed to walk in hospital room?: A Little Help needed climbing 3-5 steps with a railing? : A Little 6 Click Score: 21    End of Session Equipment Utilized During Treatment: Oxygen Activity Tolerance: Patient limited by fatigue;Patient tolerated treatment well Patient left: in chair;with call bell/phone within reach Nurse Communication: Mobility status PT Visit Diagnosis: Other abnormalities of gait and mobility (R26.89);Muscle weakness (generalized) (M62.81)     Time: 4287-6811 PT Time Calculation (min) (ACUTE ONLY): 15 min  Charges:  $Therapeutic Activity: 8-22 mins                     Marisa Severin, PT, DPT Acute Rehabilitation Services Pager [phone] Office [phone]       Marguarite Arbour A Sabra Heck 01/27/2020, 10:19 AM

## 2020-01-27 NOTE — Plan of Care (Signed)
Pt is adequate for D/C. °

## 2020-01-27 NOTE — TOC Transition Note (Signed)
Transition of Care [facility]) - CM/SW Discharge Note   Patient Details  Name: Heather [name] MRN: [id number] Date of Birth: [birth year]  [facility] CM/SW Contact:  Angelita Ingles, RN Phone Number: [phone]  01/27/2020, 3:02 PM   Clinical Narrative:  [facility] consulted for Home health needs. CM at bedside and patient states that she is currently active with [facility] for [place]. CM called [facility] and spoke with [facility] and made her aware that patient has a new active order for Home health PT and that patient will be discharging today. [facility] to resume services. No further needs noted at this time. TOC will sign off.         Patient Goals and CMS Choice        Discharge Placement                       Discharge Plan and Services                                     Social Determinants of Health (SDOH) Interventions     Readmission Risk Interventions No flowsheet data found.

## 2020-01-27 NOTE — Discharge Summary (Signed)
Physician Discharge Summary  Heather [name]:[id number] DOB: February 11, 1927 DOA: 01/23/2020  PCP: Burnard Bunting, MD  Admit date: 01/23/2020 Discharge date: 01/27/2020  Admitted From: Home Discharge disposition: Home with home health PT   Code Status: Full Code  Diet Recommendation: Cardiac diet  Discharge Diagnosis:   Active Problems:   COPD exacerbation ([place])   History of Present Illness / Brief narrative:  Heather [name] is a 86 y.o. female who presented to the ED on 01/23/2020 with complaint of progressively worsening shortness of breath, wheezing, cough, chest pain, thick mucus production and increasing oxygen dependence for 1 to 2 days. PMH significant for COPD, atrial fibrillation, hypertension, chronic diastolic CHF, chronic b/l lower extremity edema, osteoporosis, COVID-19 infection in October 2020. Not vaccinated.  In July 2021, she was hospitalized for a flare up of COPD by corona virus (not COVID).  In September, she completed a course of antibiotics and steroids however did not feel better.  She was getting  physical therapy at home but cannot work with them because she gets winded even on minimal exertion.   EMS found her hypoxic at mid 74s.  She was given albuterol, steroids and was brought to the ED.   In the ED, patient was afebrile, tachycardic to 107, tachypneic to 30s, blood pressure at 128/55, oxygen saturation maintained on 2 L by nasal cannula. CBC, BMP unremarkable except for slight elevated glucose 232, lactic acid, troponin negative Covid PCR negative Chest x-ray did not show acute findings but had chronic changes of COPD.  Initially treated in ED with steroids, bronchodilators.  Patient had persistent wheezing and is admitted to hospitalist service.  Subjective:  Seen and examined this morning.  Sitting up in chair.  On 2 L oxygen by nasal cannula maintaining saturation more than 90%.  Coughing up is easier now.  Hospital Course:  Acute on  chronic hypoxic respiratory failure COPD exacerbation -Presented with worsening shortness of breath, hypoxia, worsening cough -Chest x-ray without infiltrates. -Admitted for acute worsening of terminal COPD probably with viral/atypical bacterial infection. -Pulmonary consultation appreciated.  Patient is currently improving on IV Rocephin and IV azithromycin as well as IV steroids. -She feels better today with a scheduled Robitussin-DM q8 hrs. -Continue supplemental oxygen, wean down as tolerated. -Okay to discharge today to home on 3 more days of oral Omnicef, tapering course of steroids and bronchodilators as suggested by pulmonary. -Patient follows up with Dr. Lamonte Sakai as an outpatient.   Paroxysmal A. Fib -Currently in sinus rhythm with Cardizem. Continue Eliquis for anticoagulation  Essential hypertension Chronic diastolic CHF Chronic lower extremity edema, L > R -Home meds include Lasix 40 mg daily, losartan 25 mg daily, potassium supplement, magnesium supplement,  -Continue all.  Continue to monitor blood pressure at home. -Ordered for TED hose  Hypothyroidism -Continue Synthroid 137 mcg daily.  Osteoporosis -Outpatient management -Takes teriparatide 20 mcg injection subcu daily and calcium and vitamin D supplement.  Constipation -Continue MiraLAX daily  MRSE in urine -Urine culture obtained on admission grew 7000 CFU per mL of multidrug resistant Staphylococcus epidermidis -Patient is asymptomatic.  I would consider this as contaminant  Stable for discharge to home today with home health PT.  Wound care:    Discharge Exam:   Vitals:   01/26/20 2320 01/27/20 0322 01/27/20 0720 01/27/20 0725  BP: 117/68 136/74  139/72  Pulse: 75 77  78  Resp: 20 19  19   Temp: 98.1 F (36.7 C) 97.8 F (36.6 C)  97.8 F (  36.6 C)  TempSrc: Oral Oral  Oral  SpO2: 93% 97% 95% 98%  Weight:      Height:        Body mass index is 17.71 kg/m.  General exam: Appears calm and  comfortable.  Not in physical distress Skin: No rashes, lesions or ulcers. HEENT: Atraumatic, normocephalic, supple neck, no obvious bleeding Lungs: Diminished air entry bilaterally, scattered end expiratory wheezing.  No crackles CVS: Regular rate and rhythm, no murmur GI/Abd soft, nontender, nondistended, bowel sound present CNS: Alert, awake, oriented x3 Psychiatry: Mood appropriate Extremities: No pedal edema, no calf tenderness  Follow ups:   Discharge Instructions    Diet - low sodium heart healthy   Complete by: As directed    Increase activity slowly   Complete by: As directed       Follow-up Information    Burnard Bunting, MD Follow up.   Specialty: Internal Medicine Contact information: [address] [place] [address] [phone]               Recommendations for Outpatient Follow-Up:   1. Follow-up with PCP as an outpatient 2. Follow-up with pulmonary as an outpatient  Discharge Instructions:  Follow with Primary MD Burnard Bunting, MD in 7 days   Get CBC/BMP checked in next visit within 1 week by PCP or SNF MD ( we routinely change or add medications that can affect your baseline labs and fluid status, therefore we recommend that you get the mentioned basic workup next visit with your PCP, your PCP may decide not to get them or add new tests based on their clinical decision)  On your next visit with your PCP, please Get Medicines reviewed and adjusted.  Please request your PCP  to go over all Hospital Tests and Procedure/Radiological results at the follow up, please get all Hospital records sent to your Prim MD by signing hospital release before you go home.  Activity: As tolerated with Full fall precautions use walker/cane & assistance as needed  For Heart failure patients - Check your Weight same time everyday, if you gain over 2 pounds, or you develop in leg swelling, experience more shortness of breath or chest pain, call your Primary MD  immediately. Follow Cardiac Low Salt Diet and 1.5 lit/day fluid restriction.  If you have smoked or chewed Tobacco in the last 2 yrs please stop smoking, stop any regular Alcohol  and or any Recreational drug use.  If you experience worsening of your admission symptoms, develop shortness of breath, life threatening emergency, suicidal or homicidal thoughts you must seek medical attention immediately by calling 911 or calling your MD immediately  if symptoms less severe.  You Must read complete instructions/literature along with all the possible adverse reactions/side effects for all the Medicines you take and that have been prescribed to you. Take any new Medicines after you have completely understood and accpet all the possible adverse reactions/side effects.   Do not drive, operate heavy machinery, perform activities at heights, swimming or participation in water activities or provide baby sitting services if your were admitted for syncope or siezures until you have seen by Primary MD or a Neurologist and advised to do so again.  Do not drive when taking Pain medications.  Do not take more than prescribed Pain, Sleep and Anxiety Medications  Wear Seat belts while driving.   Please note You were cared for by a hospitalist during your hospital stay. If you have any questions about your discharge medications or  the care you received while you were in the hospital after you are discharged, you can call the unit and asked to speak with the hospitalist on call if the hospitalist that took care of you is not available. Once you are discharged, your primary care physician will handle any further medical issues. Please note that NO REFILLS for any discharge medications will be authorized once you are discharged, as it is imperative that you return to your primary care physician (or establish a relationship with a primary care physician if you do not have one) for your aftercare needs so that they can  reassess your need for medications and monitor your lab values.    Allergies as of 01/27/2020      Reactions   Ace Inhibitors Cough   Sulfur Nausea And Vomiting      Medication List    TAKE these medications   acetaminophen 650 MG CR tablet Commonly known as: TYLENOL Take 650 mg by mouth daily as needed for pain. What changed: Another medication with the same name was changed. Make sure you understand how and when to take each.   acetaminophen 325 MG tablet Commonly known as: TYLENOL Take 2 tablets (650 mg total) by mouth every 6 (six) hours as needed for mild pain or headache (fever >/= 101). What changed: reasons to take this   albuterol (2.5 MG/3ML) 0.083% nebulizer solution Commonly known as: PROVENTIL Take 2.5 mg by nebulization every 6 (six) hours as needed for wheezing or shortness of breath.   albuterol 108 (90 Base) MCG/ACT inhaler Commonly known as: VENTOLIN HFA Inhale 2 puffs into the lungs every 6 (six) hours as needed for wheezing or shortness of breath.   BIOGAIA PROBIOTIC PO Take 1 capsule by mouth daily.   calcium citrate-vitamin D 315-200 MG-UNIT tablet Commonly known as: CITRACAL+D Take 1 tablet by mouth daily.   cefdinir 300 MG capsule Commonly known as: OMNICEF Take 1 capsule (300 mg total) by mouth 2 (two) times daily for 3 days.   cholecalciferol 25 MCG (1000 UNIT) tablet Commonly known as: VITAMIN D3 Take 1,000 Units by mouth daily.   diltiazem 180 MG 24 hr capsule Commonly known as: CARDIZEM CD Take 180 mg by mouth daily.   Eliquis 2.5 MG Tabs tablet Generic drug: apixaban Take 1 tablet by mouth twice daily What changed: how much to take   feeding supplement Liqd Take 237 mLs by mouth 2 (two) times daily between meals.   fluticasone 50 MCG/ACT nasal spray Commonly known as: FLONASE Place 2 sprays into both nostrils at bedtime.   Forteo 620 MCG/2.48ML Sopn Generic drug: Teriparatide (Recombinant) Inject 20 mcg into the skin daily  before breakfast.   furosemide 40 MG tablet Commonly known as: LASIX Take 40 mg by mouth daily.   guaiFENesin-dextromethorphan 100-10 MG/5ML syrup Commonly known as: ROBITUSSIN DM Take 5 mLs by mouth every 6 (six) hours as needed for cough. What changed: how much to take   Klor-Con M20 20 MEQ tablet Generic drug: potassium chloride SA Take 20 mEq by mouth daily.   levothyroxine 137 MCG tablet Commonly known as: SYNTHROID Take 137 mcg by mouth daily before breakfast.   losartan 25 MG tablet Commonly known as: COZAAR Take 25 mg by mouth daily.   magnesium oxide 400 MG tablet Commonly known as: MAG-OX Take 400 mg by mouth daily.   Melatonin 10 MG Caps Take 10 mg by mouth at bedtime.   multivitamin with minerals tablet Take 1 tablet by mouth  daily.   polyethylene glycol 17 g packet Commonly known as: MIRALAX / GLYCOLAX Take 17 g by mouth daily.   predniSONE 10 MG tablet Commonly known as: DELTASONE Take 4 tablets daily X 2 days, then, Take 3 tablets daily X 2 days, then, Take 2 tablets daily X 2 days, then, Take 1 tablets daily X 1 day. What changed:   how much to take  how to take this  when to take this  reasons to take this  additional instructions   Symbicort 160-4.5 MCG/ACT inhaler Generic drug: budesonide-formoterol Inhale 1 puff into the lungs 2 (two) times daily.   THERATEARS OP Place 2 drops into both eyes daily.       Time coordinating discharge: 35 minutes  The results of significant diagnostics from this hospitalization (including imaging, microbiology, ancillary and laboratory) are listed below for reference.    Procedures and Diagnostic Studies:   DG Chest Port 1 View  Result Date: 01/23/2020 CLINICAL DATA:  Cough EXAM: PORTABLE CHEST 1 VIEW COMPARISON:  Prior chest x-ray 10/17/2019 FINDINGS: The patient is rotated to the right. Stable cardiac and mediastinal contours given the rightward rotation. Atherosclerotic calcifications again  noted in the transverse aorta. Persistent elevation of the posterior right hemidiaphragm, unchanged. Overall, hyperinflation with lucencies in the upper lungs compatible with emphysema. Background bronchitic changes remain stable. No focal airspace infiltrate, pleural effusion or pneumothorax. No acute osseous abnormality. IMPRESSION: 1. No acute cardiopulmonary process. 2. Stable hyperinflation, chronic bronchitic changes and upper lung predominant emphysematous changes. Overall features suggest COPD. 3. Stable chronic elevation of the right hemidiaphragm. 4.  Aortic Atherosclerosis (ICD10-I70.0). Electronically Signed   By: Jacqulynn Cadet M.D.   On: 01/23/2020 10:05     Labs:   Basic Metabolic Panel: Recent Labs  Lab 01/23/20 0908  NA 139  K 3.7  CL 99  CO2 29  GLUCOSE 132*  BUN 17  CREATININE 0.65  CALCIUM 9.1   GFR Estimated Creatinine Clearance: 33.5 mL/min (by C-G formula based on SCr of 0.65 mg/dL). Liver Function Tests: Recent Labs  Lab 01/23/20 0908  AST 20  ALT 19  ALKPHOS 68  BILITOT 1.8*  PROT 6.0*  ALBUMIN 3.4*   No results for input(s): LIPASE, AMYLASE in the last 168 hours. No results for input(s): AMMONIA in the last 168 hours. Coagulation profile Recent Labs  Lab 01/23/20 0908  INR 1.3*    CBC: Recent Labs  Lab 01/23/20 0908  WBC 10.3  NEUTROABS 7.9*  HGB 13.6  HCT 41.4  MCV 95.8  PLT 240   Cardiac Enzymes: No results for input(s): CKTOTAL, CKMB, CKMBINDEX, TROPONINI in the last 168 hours. BNP: Invalid input(s): POCBNP CBG: No results for input(s): GLUCAP in the last 168 hours. D-Dimer No results for input(s): DDIMER in the last 72 hours. Hgb A1c No results for input(s): HGBA1C in the last 72 hours. Lipid Profile No results for input(s): CHOL, HDL, LDLCALC, TRIG, CHOLHDL, LDLDIRECT in the last 72 hours. Thyroid function studies No results for input(s): TSH, T4TOTAL, T3FREE, THYROIDAB in the last 72 hours.  Invalid input(s):  FREET3 Anemia work up No results for input(s): VITAMINB12, FOLATE, FERRITIN, TIBC, IRON, RETICCTPCT in the last 72 hours. Microbiology Recent Results (from the past 240 hour(s))  Urine culture     Status: Abnormal   Collection Time: 01/23/20  1:19 AM   Specimen: In/Out Cath Urine  Result Value Ref Range Status   Specimen Description IN/OUT CATH URINE  Final   Special Requests  Final    NONE Performed at [facility], [place] [address], [place], [place] [address]    Culture 7,000 COLONIES/mL STAPHYLOCOCCUS EPIDERMIDIS (A)  Final   Report Status 01/26/2020 FINAL  Final   Organism ID, Bacteria STAPHYLOCOCCUS EPIDERMIDIS (A)  Final      Susceptibility   Staphylococcus epidermidis - MIC*    CIPROFLOXACIN >=8 RESISTANT Resistant     GENTAMICIN <=0.5 SENSITIVE Sensitive     NITROFURANTOIN <=16 SENSITIVE Sensitive     OXACILLIN >=4 RESISTANT Resistant     TETRACYCLINE >=16 RESISTANT Resistant     VANCOMYCIN 1 SENSITIVE Sensitive     TRIMETH/SULFA 80 RESISTANT Resistant     CLINDAMYCIN >=8 RESISTANT Resistant     RIFAMPIN <=0.5 SENSITIVE Sensitive     Inducible Clindamycin NEGATIVE Sensitive     * 7,000 COLONIES/mL STAPHYLOCOCCUS EPIDERMIDIS  Blood culture (routine single)     Status: None (Preliminary result)   Collection Time: 01/23/20  9:06 AM   Specimen: BLOOD RIGHT WRIST  Result Value Ref Range Status   Specimen Description BLOOD RIGHT WRIST  Final   Special Requests   Final    BOTTLES DRAWN AEROBIC AND ANAEROBIC Blood Culture adequate volume   Culture   Final    NO GROWTH 3 DAYS Performed at [facility], [address] [place], [place] [address]    Report Status PENDING  Incomplete  Respiratory Panel by RT PCR (Flu A&B, Covid) - Nasopharyngeal Swab     Status: None   Collection Time: 01/23/20  9:13 AM   Specimen: Nasopharyngeal Swab  Result Value Ref Range Status   SARS Coronavirus 2 by RT PCR NEGATIVE NEGATIVE Final    Comment: (NOTE) SARS-CoV-2 target  nucleic acids are NOT DETECTED.  The SARS-CoV-2 RNA is generally detectable in upper respiratoy specimens during the acute phase of infection. The lowest concentration of SARS-CoV-2 viral copies this assay can detect is 131 copies/mL. A negative result does not preclude SARS-Cov-2 infection and should not be used as the sole basis for treatment or other patient management decisions. A negative result may occur with  improper specimen collection/handling, submission of specimen other than nasopharyngeal swab, presence of viral mutation(s) within the areas targeted by this assay, and inadequate number of viral copies (<131 copies/mL). A negative result must be combined with clinical observations, patient history, and epidemiological information. The expected result is Negative.  Fact Sheet for Patients:  PinkCheek.be  Fact Sheet for Healthcare Providers:  GravelBags.it  This test is no t yet approved or cleared by the [place] FDA and  has been authorized for detection and/or diagnosis of SARS-CoV-2 by FDA under an Emergency Use Authorization (EUA). This EUA will remain  in effect (meaning this test can be used) for the duration of the COVID-19 declaration under Section 564(b)(1) of the Act, 21 U.S.C. section 360bbb-3(b)(1), unless the authorization is terminated or revoked sooner.     Influenza A by PCR NEGATIVE NEGATIVE Final   Influenza B by PCR NEGATIVE NEGATIVE Final    Comment: (NOTE) The Xpert Xpress SARS-CoV-2/FLU/RSV assay is intended as an aid in  the diagnosis of influenza from Nasopharyngeal swab specimens and  should not be used as a sole basis for treatment. Nasal washings and  aspirates are unacceptable for Xpert Xpress SARS-CoV-2/FLU/RSV  testing.  Fact Sheet for Patients: PinkCheek.be  Fact Sheet for Healthcare  Providers: GravelBags.it  This test is not yet approved or cleared by the [place] and  has been authorized for detection and/or diagnosis of SARS-CoV-2 by  FDA under an Emergency Use Authorization (EUA). This EUA will remain  in effect (meaning this test can be used) for the duration of the  Covid-19 declaration under Section 564(b)(1) of the Act, 21  U.S.C. section 360bbb-3(b)(1), unless the authorization is  terminated or revoked. Performed at [facility], [place] [address] [place], [place] [address]   Expectorated sputum assessment w rflx to resp cult     Status: None   Collection Time: 01/24/20  7:13 AM   Specimen: Expectorated Sputum  Result Value Ref Range Status   Specimen Description EXPECTORATED SPUTUM  Final   Special Requests NONE  Final   Sputum evaluation   Final    THIS SPECIMEN IS ACCEPTABLE FOR SPUTUM CULTURE Performed at [facility], [place] [address] [place], [place] [address]    Report Status 01/25/2020 FINAL  Final  Culture, respiratory     Status: None (Preliminary result)   Collection Time: 01/24/20  7:13 AM  Result Value Ref Range Status   Specimen Description EXPECTORATED SPUTUM  Final   Special Requests NONE Reflexed from [id number]  Final   Gram Stain   Final    RARE WBC PRESENT, PREDOMINANTLY PMN FEW GRAM POSITIVE COCCI IN PAIRS IN CLUSTERS FEW GRAM NEGATIVE RODS    Culture   Final    CULTURE REINCUBATED FOR BETTER GROWTH Performed at [facility], [place] [address] [place], [place] [address]    Report Status PENDING  Incomplete  Respiratory Panel by PCR     Status: None   Collection Time: 01/24/20  2:51 PM   Specimen: Nasopharyngeal Swab; Respiratory  Result Value Ref Range Status   Adenovirus NOT DETECTED NOT DETECTED Final   Coronavirus 229E NOT DETECTED NOT DETECTED Final    Comment: (NOTE) The Coronavirus on the Respiratory Panel, DOES NOT test for the novel  Coronavirus (2019 nCoV)     Coronavirus HKU1 NOT DETECTED NOT DETECTED Final   Coronavirus NL63 NOT DETECTED NOT DETECTED Final   Coronavirus OC43 NOT DETECTED NOT DETECTED Final   Metapneumovirus NOT DETECTED NOT DETECTED Final   Rhinovirus / Enterovirus NOT DETECTED NOT DETECTED Final   Influenza A NOT DETECTED NOT DETECTED Final   Influenza B NOT DETECTED NOT DETECTED Final   Parainfluenza Virus 1 NOT DETECTED NOT DETECTED Final   Parainfluenza Virus 2 NOT DETECTED NOT DETECTED Final   Parainfluenza Virus 3 NOT DETECTED NOT DETECTED Final   Parainfluenza Virus 4 NOT DETECTED NOT DETECTED Final   Respiratory Syncytial Virus NOT DETECTED NOT DETECTED Final   Bordetella pertussis NOT DETECTED NOT DETECTED Final   Chlamydophila pneumoniae NOT DETECTED NOT DETECTED Final   Mycoplasma pneumoniae NOT DETECTED NOT DETECTED Final    Comment: Performed at [facility], [place]. [address] [place], [place] [address]     Signed: Terrilee Croak  [facility] 01/27/2020, 9:31 AM

## 2020-01-28 LAB — CULTURE, BLOOD (SINGLE)
Culture: NO GROWTH
Special Requests: ADEQUATE

## 2020-01-31 DIAGNOSIS — I480 Paroxysmal atrial fibrillation: Secondary | ICD-10-CM | POA: Diagnosis not present

## 2020-01-31 DIAGNOSIS — I8310 Varicose veins of unspecified lower extremity with inflammation: Secondary | ICD-10-CM | POA: Diagnosis not present

## 2020-01-31 DIAGNOSIS — I5032 Chronic diastolic (congestive) heart failure: Secondary | ICD-10-CM | POA: Diagnosis not present

## 2020-01-31 DIAGNOSIS — J9610 Chronic respiratory failure, unspecified whether with hypoxia or hypercapnia: Secondary | ICD-10-CM | POA: Diagnosis not present

## 2020-01-31 DIAGNOSIS — M5416 Radiculopathy, lumbar region: Secondary | ICD-10-CM | POA: Diagnosis not present

## 2020-01-31 DIAGNOSIS — M810 Age-related osteoporosis without current pathological fracture: Secondary | ICD-10-CM | POA: Diagnosis not present

## 2020-01-31 DIAGNOSIS — J441 Chronic obstructive pulmonary disease with (acute) exacerbation: Secondary | ICD-10-CM | POA: Diagnosis not present

## 2020-01-31 DIAGNOSIS — N189 Chronic kidney disease, unspecified: Secondary | ICD-10-CM | POA: Diagnosis not present

## 2020-01-31 DIAGNOSIS — I130 Hypertensive heart and chronic kidney disease with heart failure and stage 1 through stage 4 chronic kidney disease, or unspecified chronic kidney disease: Secondary | ICD-10-CM | POA: Diagnosis not present

## 2020-02-02 ENCOUNTER — Other Ambulatory Visit: Payer: Self-pay

## 2020-02-02 MED ORDER — BUDESONIDE-FORMOTEROL FUMARATE 160-4.5 MCG/ACT IN AERO
1.0000 | INHALATION_SPRAY | Freq: Two times a day (BID) | RESPIRATORY_TRACT | 6 refills | Status: DC
Start: 2020-02-02 — End: 2020-10-22

## 2020-02-03 DIAGNOSIS — M5416 Radiculopathy, lumbar region: Secondary | ICD-10-CM | POA: Diagnosis not present

## 2020-02-03 DIAGNOSIS — I480 Paroxysmal atrial fibrillation: Secondary | ICD-10-CM | POA: Diagnosis not present

## 2020-02-03 DIAGNOSIS — N189 Chronic kidney disease, unspecified: Secondary | ICD-10-CM | POA: Diagnosis not present

## 2020-02-03 DIAGNOSIS — I130 Hypertensive heart and chronic kidney disease with heart failure and stage 1 through stage 4 chronic kidney disease, or unspecified chronic kidney disease: Secondary | ICD-10-CM | POA: Diagnosis not present

## 2020-02-03 DIAGNOSIS — M810 Age-related osteoporosis without current pathological fracture: Secondary | ICD-10-CM | POA: Diagnosis not present

## 2020-02-03 DIAGNOSIS — J441 Chronic obstructive pulmonary disease with (acute) exacerbation: Secondary | ICD-10-CM | POA: Diagnosis not present

## 2020-02-03 DIAGNOSIS — I8310 Varicose veins of unspecified lower extremity with inflammation: Secondary | ICD-10-CM | POA: Diagnosis not present

## 2020-02-03 DIAGNOSIS — J9610 Chronic respiratory failure, unspecified whether with hypoxia or hypercapnia: Secondary | ICD-10-CM | POA: Diagnosis not present

## 2020-02-03 DIAGNOSIS — I5032 Chronic diastolic (congestive) heart failure: Secondary | ICD-10-CM | POA: Diagnosis not present

## 2020-02-07 DIAGNOSIS — R195 Other fecal abnormalities: Secondary | ICD-10-CM | POA: Diagnosis not present

## 2020-02-07 DIAGNOSIS — E039 Hypothyroidism, unspecified: Secondary | ICD-10-CM | POA: Diagnosis not present

## 2020-02-07 DIAGNOSIS — I480 Paroxysmal atrial fibrillation: Secondary | ICD-10-CM | POA: Diagnosis not present

## 2020-02-07 DIAGNOSIS — J449 Chronic obstructive pulmonary disease, unspecified: Secondary | ICD-10-CM | POA: Diagnosis not present

## 2020-02-07 DIAGNOSIS — I5032 Chronic diastolic (congestive) heart failure: Secondary | ICD-10-CM | POA: Diagnosis not present

## 2020-02-07 DIAGNOSIS — M8000XD Age-related osteoporosis with current pathological fracture, unspecified site, subsequent encounter for fracture with routine healing: Secondary | ICD-10-CM | POA: Diagnosis not present

## 2020-02-07 DIAGNOSIS — J9601 Acute respiratory failure with hypoxia: Secondary | ICD-10-CM | POA: Diagnosis not present

## 2020-02-08 DIAGNOSIS — I8310 Varicose veins of unspecified lower extremity with inflammation: Secondary | ICD-10-CM | POA: Diagnosis not present

## 2020-02-08 DIAGNOSIS — I5032 Chronic diastolic (congestive) heart failure: Secondary | ICD-10-CM | POA: Diagnosis not present

## 2020-02-08 DIAGNOSIS — J9610 Chronic respiratory failure, unspecified whether with hypoxia or hypercapnia: Secondary | ICD-10-CM | POA: Diagnosis not present

## 2020-02-08 DIAGNOSIS — I130 Hypertensive heart and chronic kidney disease with heart failure and stage 1 through stage 4 chronic kidney disease, or unspecified chronic kidney disease: Secondary | ICD-10-CM | POA: Diagnosis not present

## 2020-02-08 DIAGNOSIS — M5416 Radiculopathy, lumbar region: Secondary | ICD-10-CM | POA: Diagnosis not present

## 2020-02-08 DIAGNOSIS — M810 Age-related osteoporosis without current pathological fracture: Secondary | ICD-10-CM | POA: Diagnosis not present

## 2020-02-08 DIAGNOSIS — N189 Chronic kidney disease, unspecified: Secondary | ICD-10-CM | POA: Diagnosis not present

## 2020-02-08 DIAGNOSIS — I480 Paroxysmal atrial fibrillation: Secondary | ICD-10-CM | POA: Diagnosis not present

## 2020-02-08 DIAGNOSIS — J441 Chronic obstructive pulmonary disease with (acute) exacerbation: Secondary | ICD-10-CM | POA: Diagnosis not present

## 2020-02-10 DIAGNOSIS — J9610 Chronic respiratory failure, unspecified whether with hypoxia or hypercapnia: Secondary | ICD-10-CM | POA: Diagnosis not present

## 2020-02-10 DIAGNOSIS — I130 Hypertensive heart and chronic kidney disease with heart failure and stage 1 through stage 4 chronic kidney disease, or unspecified chronic kidney disease: Secondary | ICD-10-CM | POA: Diagnosis not present

## 2020-02-10 DIAGNOSIS — I8310 Varicose veins of unspecified lower extremity with inflammation: Secondary | ICD-10-CM | POA: Diagnosis not present

## 2020-02-10 DIAGNOSIS — J441 Chronic obstructive pulmonary disease with (acute) exacerbation: Secondary | ICD-10-CM | POA: Diagnosis not present

## 2020-02-10 DIAGNOSIS — I480 Paroxysmal atrial fibrillation: Secondary | ICD-10-CM | POA: Diagnosis not present

## 2020-02-10 DIAGNOSIS — N189 Chronic kidney disease, unspecified: Secondary | ICD-10-CM | POA: Diagnosis not present

## 2020-02-10 DIAGNOSIS — M5416 Radiculopathy, lumbar region: Secondary | ICD-10-CM | POA: Diagnosis not present

## 2020-02-10 DIAGNOSIS — M810 Age-related osteoporosis without current pathological fracture: Secondary | ICD-10-CM | POA: Diagnosis not present

## 2020-02-10 DIAGNOSIS — I5032 Chronic diastolic (congestive) heart failure: Secondary | ICD-10-CM | POA: Diagnosis not present

## 2020-02-12 DIAGNOSIS — M810 Age-related osteoporosis without current pathological fracture: Secondary | ICD-10-CM | POA: Diagnosis not present

## 2020-02-12 DIAGNOSIS — I130 Hypertensive heart and chronic kidney disease with heart failure and stage 1 through stage 4 chronic kidney disease, or unspecified chronic kidney disease: Secondary | ICD-10-CM | POA: Diagnosis not present

## 2020-02-12 DIAGNOSIS — I8310 Varicose veins of unspecified lower extremity with inflammation: Secondary | ICD-10-CM | POA: Diagnosis not present

## 2020-02-12 DIAGNOSIS — J449 Chronic obstructive pulmonary disease, unspecified: Secondary | ICD-10-CM | POA: Diagnosis not present

## 2020-02-12 DIAGNOSIS — N189 Chronic kidney disease, unspecified: Secondary | ICD-10-CM | POA: Diagnosis not present

## 2020-02-12 DIAGNOSIS — M5416 Radiculopathy, lumbar region: Secondary | ICD-10-CM | POA: Diagnosis not present

## 2020-02-12 DIAGNOSIS — J9620 Acute and chronic respiratory failure, unspecified whether with hypoxia or hypercapnia: Secondary | ICD-10-CM | POA: Diagnosis not present

## 2020-02-12 DIAGNOSIS — I5032 Chronic diastolic (congestive) heart failure: Secondary | ICD-10-CM | POA: Diagnosis not present

## 2020-02-12 DIAGNOSIS — J441 Chronic obstructive pulmonary disease with (acute) exacerbation: Secondary | ICD-10-CM | POA: Diagnosis not present

## 2020-02-12 DIAGNOSIS — I480 Paroxysmal atrial fibrillation: Secondary | ICD-10-CM | POA: Diagnosis not present

## 2020-02-13 ENCOUNTER — Ambulatory Visit: Payer: Medicare HMO | Admitting: Acute Care

## 2020-02-13 ENCOUNTER — Encounter: Payer: Self-pay | Admitting: Acute Care

## 2020-02-13 ENCOUNTER — Telehealth: Payer: Self-pay | Admitting: Emergency Medicine

## 2020-02-13 ENCOUNTER — Other Ambulatory Visit: Payer: Self-pay

## 2020-02-13 ENCOUNTER — Ambulatory Visit (INDEPENDENT_AMBULATORY_CARE_PROVIDER_SITE_OTHER): Payer: Medicare HMO

## 2020-02-13 VITALS — BP 112/72 | HR 98 | Temp 97.7°F | Ht 63.0 in | Wt 101.4 lb

## 2020-02-13 DIAGNOSIS — R0602 Shortness of breath: Secondary | ICD-10-CM | POA: Diagnosis not present

## 2020-02-13 DIAGNOSIS — K219 Gastro-esophageal reflux disease without esophagitis: Secondary | ICD-10-CM

## 2020-02-13 DIAGNOSIS — I5030 Unspecified diastolic (congestive) heart failure: Secondary | ICD-10-CM

## 2020-02-13 DIAGNOSIS — Z7189 Other specified counseling: Secondary | ICD-10-CM

## 2020-02-13 DIAGNOSIS — R1310 Dysphagia, unspecified: Secondary | ICD-10-CM

## 2020-02-13 DIAGNOSIS — J439 Emphysema, unspecified: Secondary | ICD-10-CM | POA: Diagnosis not present

## 2020-02-13 DIAGNOSIS — Z0000 Encounter for general adult medical examination without abnormal findings: Secondary | ICD-10-CM

## 2020-02-13 DIAGNOSIS — J438 Other emphysema: Secondary | ICD-10-CM

## 2020-02-13 DIAGNOSIS — I38 Endocarditis, valve unspecified: Secondary | ICD-10-CM | POA: Diagnosis not present

## 2020-02-13 MED ORDER — PREDNISONE 10 MG PO TABS: ORAL_TABLET | ORAL | 0 refills | Status: DC

## 2020-02-13 MED ORDER — BREZTRI AEROSPHERE 160-9-4.8 MCG/ACT IN AERO: 2.0000 | INHALATION_SPRAY | Freq: Two times a day (BID) | RESPIRATORY_TRACT | 0 refills | Status: DC

## 2020-02-13 NOTE — Progress Notes (Signed)
History of Present Illness Heather [name] is a 86 y.o. female with COPD, atrial fibrillation, hypertension, chronic diastolic CHF, chronic b/l lower extremity edema, osteoporosis, COVID-19 infection in 03-Feb-2019 ( 5 day hospitalization 01/05/2019-01/11/2019)  and a pulmonary nodule.She has been on continuous oxygen since Covid February 03, 2019 . Not vaccinated.She is followed by Dr. Lamonte Sakai and Wyn Quaker NP She was treated by Dr. Erin Fulling as an inpatient.   Home COPD regimen includes symbicort 160-4.80mcg 2 puffs twice daily and as needed duoneb neblizer treatments which she uses each morning.  She produces  daily sputum and uses a flutter valve occasionally in the mornings. She as had multiple COPD exacerbations in the past year secondary to upper respiratory viral infections.  Baseline oxygen needs 3 L [place] with exertion.   She was hospitalized 10/25-10/29 with a COPD exacerbation.    02/13/2020\Pt. Presents for follow up. She was discharged from the hospital 10/29 after a COPD exacerbation. She was treated with Solucortef,transitioned to pred taper,  Ceftriaxone for 5 days and azithromycin for 5 days, Oxygen and BD therapy. She was discharged home on her 3 L of oxygen an additional  3 days of oral Omnicef, tapering course of steroids and bronchodilators . She presents today just to " make sure everything is ok,". She does have a follow up with Dr. Lamonte Sakai but she cancelled that for appointment today. She initially did well after discharge from the hospital after the prednisone taper. Once she finished the taper, within 48 hours she has had continuing increase in shortness of breath. Secretions remain a creamy white, and she is using her flutter valve. No fever, chest pain or back pain. What pain she has she states comes and goes. She states she is wheezing . She states she usually wheezes with exertion, but now it is more frequent. She does have worsening of her foot swelling than usually, especially on her  left side. She states she has trouble swallowing. She had her esophagus dilated 05/2017 by Dr. Watt Climes.  This is not new. It has become worse in the last month. She has not had weight loss with this. She is on lasix. 40 daily.This was up titrated  by Dr. Harley Hallmark office when the patient had worsening swelling . She is also taking potassium daily.   She has significant L lower extremity edema today in the office. CXR is is not indicative of vascular congestion. She is followed by Dr. Debara Pickett, but she states she has not seen him since 2018. Last echo was 2018. She is using Symbicort only 1 puff twice daily. This does was decreased by Dr. Reynaldo Minium sometime in the past. She is using albuterol nebs twice daily.We will up-titrate her to triple therapy, at full dosing.   I spent some time discussing end stage COPD with the patient's daughter today. We discussed making decisions like not placing patient's on ventilators, and clear goals of care for when patient's reach the end stage of a disease state. I have encouraged her to discuss this with her mother, so she knows what her wishes are when the time comes.She states her mother has told her she does not want overly aggressive care.    The patient's husband passed away from Covid 21. 02-03-19, He was sick with Covid at the same time as the patient.    Test Results: Significant Diagnostic Tests:  CXR 02/13/2020 The heart size and mediastinal contours are within normal limits. Hyperinflation and emphysema. Unchanged elevation of the right hemidiaphragm.  Disc degenerative disease of the thoracic spine with multiple thoracic wedge deformities and exaggerated kyphosis.  IMPRESSION: Hyperinflation and emphysema without acute abnormality of the lungs.  CXR 01/24/20 The patient is rotated to the right. Stable cardiac and mediastinal contours given the rightward rotation. Atherosclerotic calcifications again noted in the transverse aorta. Persistent elevation  of the posterior right hemidiaphragm, unchanged. Overall, hyperinflation with lucencies in the upper lungs compatible with emphysema. Background bronchitic changes remain stable. No focal airspace infiltrate, pleural effusion or pneumothorax. No acute osseous abnormality  04/22/2019-CT chest without contrast-slight enlargement with enlargement over time of a spiculated nodule in the right inferior chest concerning for malignancy, PET scan or biopsy may be helpful as clinically warranted, stable moderate dilatation of ascending thoracic aorta to 3.9 centimeters, recommending annual imaging follow-up, stable dilatation of main pulmonary artery, 3 mm nodule right lung base, stable advanced pulmonary emphysema 01/21/2017-echocardiogram-LV ejection fraction 65 to 10%, grade 1 diastolic dysfunction, aortic valve moderately calcified annulus, moderately thickened and moderately calcified leaflets, trivial regurg, mitral valve mildly to moderately calcified annulus  05/31/2013-pulmonary function test-FVC 2.10 (91% predicted), postbronchodilator ratio 50, postbronchodilator FEV1 1.08 (63% predicted), no bronchodilator response, dlco 15.99 (66 percent predicted)  Micro Data:  Bld Cx 01/23/20 >> Negative Resp Viral Panel 01/24/20 - Corona Virus OC 43 Sputum Culture 10/25 >> Negative Antimicrobials:  Cefepime 10/25 - 10/26 Ceftriaxone 10/26>>  Azithromycin 10/26>>    CBC Latest Ref Rng & Units 01/23/2020 10/20/2019 10/19/2019  WBC 4.0 - 10.5 K/uL 10.3 11.8(H) 13.8(H)  Hemoglobin 12.0 - 15.0 g/dL 13.6 13.9 13.4  Hematocrit 36 - 46 % 41.4 41.7 40.7  Platelets 150 - 400 K/uL 240 256 215    BMP Latest Ref Rng & Units 01/23/2020 10/20/2019 10/19/2019  Glucose 70 - 99 mg/dL 132(H) 220(H) 191(H)  BUN 8 - 23 mg/dL 17 26(H) 19  Creatinine 0.44 - 1.00 mg/dL 0.65 0.74 0.58  BUN/Creat Ratio 12 - 28 - - -  Sodium 135 - 145 mmol/L 139 139 140  Potassium 3.5 - 5.1 mmol/L 3.7 4.6 4.1  Chloride 98 - 111 mmol/L 99  103 103  CO2 22 - 32 mmol/L 29 27 29   Calcium 8.9 - 10.3 mg/dL 9.1 9.3 9.0    BNP    Component Value Date/Time   BNP 55.0 01/23/2020 0908    ProBNP    Component Value Date/Time   PROBNP 121.6 02/20/2014 1435    PFT    Component Value Date/Time   FEV1PRE 1.00 05/31/2013 1300   FEV1POST 1.08 05/31/2013 1300   FVCPRE 2.10 05/31/2013 1300   FVCPOST 2.15 05/31/2013 1300   DLCOUNC 15.19 05/31/2013 1300   PREFEV1FVCRT 48 05/31/2013 1300   PSTFEV1FVCRT 50 05/31/2013 1300    DG Chest 2 View  Result Date: 02/13/2020 CLINICAL DATA:  Shortness of breath, emphysema EXAM: CHEST - 2 VIEW COMPARISON:  01/23/2020 FINDINGS: The heart size and mediastinal contours are within normal limits. Hyperinflation and emphysema. Unchanged elevation of the right hemidiaphragm. Disc degenerative disease of the thoracic spine with multiple thoracic wedge deformities and exaggerated kyphosis. IMPRESSION: Hyperinflation and emphysema without acute abnormality of the lungs. Electronically Signed   By: Eddie Candle M.D.   On: 02/13/2020 12:20   DG Chest Port 1 View  Result Date: 01/23/2020 CLINICAL DATA:  Cough EXAM: PORTABLE CHEST 1 VIEW COMPARISON:  Prior chest x-ray 10/17/2019 FINDINGS: The patient is rotated to the right. Stable cardiac and mediastinal contours given the rightward rotation. Atherosclerotic calcifications again noted in the transverse  aorta. Persistent elevation of the posterior right hemidiaphragm, unchanged. Overall, hyperinflation with lucencies in the upper lungs compatible with emphysema. Background bronchitic changes remain stable. No focal airspace infiltrate, pleural effusion or pneumothorax. No acute osseous abnormality. IMPRESSION: 1. No acute cardiopulmonary process. 2. Stable hyperinflation, chronic bronchitic changes and upper lung predominant emphysematous changes. Overall features suggest COPD. 3. Stable chronic elevation of the right hemidiaphragm. 4.  Aortic Atherosclerosis  (ICD10-I70.0). Electronically Signed   By: Jacqulynn Cadet M.D.   On: 01/23/2020 10:05     Past medical hx Past Medical History:  Diagnosis Date  . Anxiety   . Arthritis   . Atrial fibrillation ([place])   . Cerumen impaction   . COPD (chronic obstructive pulmonary disease) ([place])   . Dysrhythmia    AF  . Essential hypertension, benign   . Gallstones   . GERD (gastroesophageal reflux disease)   . H/O hiatal hernia   . History of nuclear stress test 10/2010   dipyridamole; normal pattern of perfusion; low risk, normal study  . 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 Mild aortic insufficiency   . Osteoporosis   . Peripheral neuropathy   . Pneumonia    states she had it twice, last time a couple of months ago  . PONV (postoperative nausea and vomiting)   . Shortness of breath    with exertion  . Unspecified hypothyroidism   . Venous insufficiency      Social History   Tobacco Use  . Smoking status: Former Smoker    Packs/day: 1.00    Years: 20.00    Pack years: 20.00    Types: Cigarettes    Start date: 58    Quit date: 09/12/1978    Years since quitting: 41.4  . Smokeless tobacco: Never Used  Vaping Use  . Vaping Use: Never used  Substance Use Topics  . Alcohol use: Not Currently    Alcohol/week: 0.0 standard drinks    Comment: occ  . Drug use: No      Tobacco Cessation: Former smoker, quit 1980 with a 20 pack year smoking history   Past surgical hx, Family hx, Social hx all reviewed.  Current Outpatient Medications on File Prior to Visit  Medication Sig  . acetaminophen (TYLENOL) 325 MG tablet Take 2 tablets (650 mg total) by mouth every 6 (six) hours as needed for mild pain or headache (fever >/= 101). (Patient taking differently: Take 650 mg by mouth every 6 (six) hours as needed for mild pain or headache. )  . acetaminophen (TYLENOL) 650 MG CR tablet Take 650 mg by mouth daily as needed for pain.   Marland Kitchen albuterol (PROVENTIL)  (2.5 MG/3ML) 0.083% nebulizer solution Take 2.5 mg by nebulization every 6 (six) hours as needed for wheezing or shortness of breath.  Marland Kitchen albuterol (VENTOLIN HFA) 108 (90 Base) MCG/ACT inhaler Inhale 2 puffs into the lungs every 6 (six) hours as needed for wheezing or shortness of breath.  . budesonide-formoterol (SYMBICORT) 160-4.5 MCG/ACT inhaler Inhale 1 puff into the lungs 2 (two) times daily.  . calcium citrate-vitamin D (CITRACAL+D) 315-200 MG-UNIT per tablet Take 1 tablet by mouth daily.  . Carboxymethylcellulose Sodium (THERATEARS OP) Place 2 drops into both eyes daily.   . cholecalciferol (VITAMIN D3) 25 MCG (1000 UNIT) tablet Take 1,000 Units by mouth daily.  Marland Kitchen diltiazem (CARDIZEM CD) 180 MG 24 hr capsule Take 180 mg by mouth daily.   Marland Kitchen ELIQUIS 2.5 MG TABS tablet Take 1 tablet by  mouth twice daily (Patient taking differently: Take 2.5 mg by mouth 2 (two) times daily. )  . feeding supplement, ENSURE ENLIVE, (ENSURE ENLIVE) LIQD Take 237 mLs by mouth 2 (two) times daily between meals.  . fluticasone (FLONASE) 50 MCG/ACT nasal spray Place 2 sprays into both nostrils at bedtime.   . furosemide (LASIX) 40 MG tablet Take 40 mg by mouth daily.   Marland Kitchen guaiFENesin-dextromethorphan (ROBITUSSIN DM) 100-10 MG/5ML syrup Take 5 mLs by mouth every 6 (six) hours as needed for cough. (Patient taking differently: Take 10 mLs by mouth every 6 (six) hours as needed for cough. )  . KLOR-CON M20 20 MEQ tablet Take 20 mEq by mouth daily.   . Lactobacillus Reuteri (BIOGAIA PROBIOTIC PO) Take 1 capsule by mouth daily.  Marland Kitchen levothyroxine (SYNTHROID, LEVOTHROID) 137 MCG tablet Take 137 mcg by mouth daily before breakfast.   . losartan (COZAAR) 25 MG tablet Take 25 mg by mouth daily.   . magnesium oxide (MAG-OX) 400 MG tablet Take 400 mg by mouth daily.  . Melatonin 10 MG CAPS Take 10 mg by mouth at bedtime.  . Multiple Vitamins-Minerals (MULTIVITAMIN WITH MINERALS) tablet Take 1 tablet by mouth daily.  . polyethylene  glycol (MIRALAX / GLYCOLAX) packet Take 17 g by mouth daily.   . Teriparatide, Recombinant, (FORTEO) 620 MCG/2.48ML SOPN Inject 20 mcg into the skin daily before breakfast.    No current facility-administered medications on file prior to visit.     Allergies  Allergen Reactions  . Ace Inhibitors Cough  . Sulfur Nausea And Vomiting    Review Of Systems:  Constitutional:   No  weight loss, night sweats,  Fevers, chills,+  fatigue, or  lassitude.  HEENT:   No headaches, +  Difficulty swallowing,  Tooth/dental problems, or  Sore throat,                No sneezing, itching, ear ache, nasal congestion, post nasal drip,   CV:  No chest pain,  Orthopnea, PND, + swelling in lower extremities, No anasarca, dizziness, palpitations, syncope.   GI  No heartburn, indigestion, abdominal pain, nausea, vomiting, diarrhea, change in bowel habits, loss of appetite, bloody stools.   Resp: + shortness of breath with exertion or at rest.  + excess mucus, no productive cough,  No non-productive cough,  No coughing up of blood.  No change in color of mucus.  + wheezing.  No chest wall deformity  Skin: no rash or lesions.  GU: no dysuria, change in color of urine, no urgency or frequency.  No flank pain, no hematuria   MS:  No joint pain or swelling.  No decreased range of motion.  No back pain.  Psych:  No change in mood or affect. + depression or anxiety.  No memory loss.   Vital Signs BP 112/72 (BP Location: Left Arm, Cuff Size: Normal)   Pulse 98   Temp 97.7 F (36.5 C) (Oral)   Ht 5\' 3"  (1.6 m)   Wt 101 lb 6.4 oz (46 kg)   SpO2 97%   BMI 17.96 kg/m    Physical Exam:  General- No distress,  A&Ox3, frail elderly female in wheel chair ENT: No sinus tenderness, TM clear, pale nasal mucosa, no oral exudate,no post nasal drip, no LAN Cardiac: S1, S2, regular rate and rhythm, no murmur Chest: + wheeze with cough/  Few crackles / NO dullness; no accessory muscle use, no nasal flaring, no  sternal retractions, diminished per bases Abd.: Soft  Non-tender, ND, NT, BS + Ext: No clubbing cyanosis, + BLE edema L>R Neuro:  Deconditioned and frail, MAE x 4, A&O x 3 Skin: No rashes, No lesions, warm and dry Psych: Quiet, clearly does not feel well.    Assessment/Plan  COPD with acute exacerbation Hospitalized 10/25-10/29 Treated with  IV solumedrol , prednisone, antibiotics (Ceftriaxone and azithromycin x 5 days with 3 additional days of Omnicef after d/c)   , BD and oxygen.  Worsening as soon as she finished prednisone taper from hospital End Stage COPD using only 1 puff Symbicort daily  Plan We will do a CXR today. Stat reading. >> No Acute process We will do a therapuutic Trial with Breztri. Take 2 puffs twice daily Rinse mouth after use.  We will give you a spacer for your inhaler.  We will teach you how to use this. This will give you more time to inhale your medications.   You can also refer to Youtube for instructions if you have questions after today's instructions.  Prednisone taper; 10 mg tablets: 4 tabs x 2 days, 3 tabs x 2 days, 2 tabs x 2 days 1 tab x 2 days then stop..  Continue using your albuterol nebs twice daily for now. Once you feel better, ok to go back to as needed.  Follow up with Dr. Lamonte Sakai 11/23 if we can make room on his schedule.Follow up with Derl Barrow NP CT Chest to eval for fibrotic scarring 2/2 Covid 19 if no improvement after optimizing medications and pred taper.  Please contact office for sooner follow up if symptoms do not improve or worsen or seek emergency care   Chronic Diastolic HF Lower extremity edema No follow up with cards since 2018 Plan We will order a 2 D Echo  We will call you with the results. We will refer back to Dr. Debara Pickett for follow up.    Difficulty swallowing History of GERD and esophogeal Stricture CXR negative for aspiration Plan Continue Protonix as you have been doing. We will refer back to Dr. Watt Climes for  difficulty swallowing.   Healthcare Maintenance Pt. Has had the flu vaccine and the pneumococcal vaccine, but no Covid 19 vaccine per her choice.   This appointment was over 60  minutes long with over 50% of the time being direct face to face patient care, assessment , plan of care , and follow up,   Magdalen Spatz, NP 02/13/2020  12:56 PM

## 2020-02-13 NOTE — Telephone Encounter (Signed)
Heather [name] is treating her for COPD exacerbation and she was hospitalized with COVID-19 in October and saw Dr. Erin Fulling who scheduled the follow up with you. Heather [name] is starting her on [name] and wants her to follow up with you. Thank you!    Dr. Lamonte Sakai agreed. Patient placed on scheduled. Called patient's daughter to verify.

## 2020-02-13 NOTE — Telephone Encounter (Signed)
Yes I am ok with giving her that slot

## 2020-02-13 NOTE — Patient Instructions (Addendum)
It is good to see you today. We will do a CXR today. Stat reading.  We will order a 2 D Echo  We will call you with the results. We will refer back to Dr. Debara Pickett for follow up.  We will do a therapuutic Trial with Breztri. Take 2 puffs twice daily Rinse mouth after use.  We will give you a spacer for your inhaler.  We will teach you how to use this.  You can also refer to Youtube for instructions Prednisone taper; 10 mg tablets: 4 tabs x 2 days, 3 tabs x 2 days, 2 tabs x 2 days 1 tab x 2 days then stop..  Continue using your albuterol nebs twice daily for now. Once you feel better, ok to go back to as needed.  Continue Protonix as you have been doing. We will refer back to Dr. Watt Climes for difficulty swallowing.  Follow up with Dr. Lamonte Sakai 11/23 if we can make room on his schedule.If [place] is not available, she can see Wyn Quaker NP or myself.  Please contact office for sooner follow up if symptoms do not improve or worsen or seek emergency care

## 2020-02-13 NOTE — Progress Notes (Signed)
These results were discussed in full in today's office visit. Pt. And daughter verbalized understanding.

## 2020-02-13 NOTE — Addendum Note (Signed)
Addended byCoralie Keens on: 02/13/2020 05:24 PM   Modules accepted: Orders

## 2020-02-14 ENCOUNTER — Telehealth: Payer: Self-pay | Admitting: Internal Medicine

## 2020-02-14 NOTE — Telephone Encounter (Signed)
[employer] patient assistance application mailed to patient for her to complete patient portion and return

## 2020-02-15 DIAGNOSIS — I130 Hypertensive heart and chronic kidney disease with heart failure and stage 1 through stage 4 chronic kidney disease, or unspecified chronic kidney disease: Secondary | ICD-10-CM | POA: Diagnosis not present

## 2020-02-15 DIAGNOSIS — J9620 Acute and chronic respiratory failure, unspecified whether with hypoxia or hypercapnia: Secondary | ICD-10-CM | POA: Diagnosis not present

## 2020-02-15 DIAGNOSIS — M5416 Radiculopathy, lumbar region: Secondary | ICD-10-CM | POA: Diagnosis not present

## 2020-02-15 DIAGNOSIS — I8310 Varicose veins of unspecified lower extremity with inflammation: Secondary | ICD-10-CM | POA: Diagnosis not present

## 2020-02-15 DIAGNOSIS — I480 Paroxysmal atrial fibrillation: Secondary | ICD-10-CM | POA: Diagnosis not present

## 2020-02-15 DIAGNOSIS — J441 Chronic obstructive pulmonary disease with (acute) exacerbation: Secondary | ICD-10-CM | POA: Diagnosis not present

## 2020-02-15 DIAGNOSIS — N189 Chronic kidney disease, unspecified: Secondary | ICD-10-CM | POA: Diagnosis not present

## 2020-02-15 DIAGNOSIS — M810 Age-related osteoporosis without current pathological fracture: Secondary | ICD-10-CM | POA: Diagnosis not present

## 2020-02-15 DIAGNOSIS — I5032 Chronic diastolic (congestive) heart failure: Secondary | ICD-10-CM | POA: Diagnosis not present

## 2020-02-16 ENCOUNTER — Other Ambulatory Visit: Payer: Self-pay

## 2020-02-16 ENCOUNTER — Encounter (HOSPITAL_COMMUNITY): Payer: Self-pay

## 2020-02-16 ENCOUNTER — Ambulatory Visit (HOSPITAL_COMMUNITY)
Admission: RE | Admit: 2020-02-16 | Discharge: 2020-02-16 | Disposition: A | Discharge status: Home / Self Care | Payer: Medicare HMO | Source: Ambulatory Visit | Attending: Acute Care | Admitting: Acute Care

## 2020-02-16 DIAGNOSIS — J449 Chronic obstructive pulmonary disease, unspecified: Secondary | ICD-10-CM | POA: Diagnosis not present

## 2020-02-16 DIAGNOSIS — I10 Essential (primary) hypertension: Secondary | ICD-10-CM | POA: Insufficient documentation

## 2020-02-16 DIAGNOSIS — R0602 Shortness of breath: Secondary | ICD-10-CM | POA: Diagnosis not present

## 2020-02-16 DIAGNOSIS — Z87891 Personal history of nicotine dependence: Secondary | ICD-10-CM | POA: Diagnosis not present

## 2020-02-16 DIAGNOSIS — I4891 Unspecified atrial fibrillation: Secondary | ICD-10-CM | POA: Diagnosis not present

## 2020-02-16 NOTE — Progress Notes (Signed)
  Echocardiogram 2D Echocardiogram has been performed.  Jannett Celestine 02/16/2020, 11:03 AM

## 2020-02-17 DIAGNOSIS — I8310 Varicose veins of unspecified lower extremity with inflammation: Secondary | ICD-10-CM | POA: Diagnosis not present

## 2020-02-17 DIAGNOSIS — I130 Hypertensive heart and chronic kidney disease with heart failure and stage 1 through stage 4 chronic kidney disease, or unspecified chronic kidney disease: Secondary | ICD-10-CM | POA: Diagnosis not present

## 2020-02-17 DIAGNOSIS — M5416 Radiculopathy, lumbar region: Secondary | ICD-10-CM | POA: Diagnosis not present

## 2020-02-17 DIAGNOSIS — J9620 Acute and chronic respiratory failure, unspecified whether with hypoxia or hypercapnia: Secondary | ICD-10-CM | POA: Diagnosis not present

## 2020-02-17 DIAGNOSIS — I480 Paroxysmal atrial fibrillation: Secondary | ICD-10-CM | POA: Diagnosis not present

## 2020-02-17 DIAGNOSIS — M810 Age-related osteoporosis without current pathological fracture: Secondary | ICD-10-CM | POA: Diagnosis not present

## 2020-02-17 DIAGNOSIS — I5032 Chronic diastolic (congestive) heart failure: Secondary | ICD-10-CM | POA: Diagnosis not present

## 2020-02-17 DIAGNOSIS — J441 Chronic obstructive pulmonary disease with (acute) exacerbation: Secondary | ICD-10-CM | POA: Diagnosis not present

## 2020-02-17 DIAGNOSIS — N189 Chronic kidney disease, unspecified: Secondary | ICD-10-CM | POA: Diagnosis not present

## 2020-02-17 LAB — ECHOCARDIOGRAM COMPLETE: S' Lateral: 2.1 cm

## 2020-02-19 DIAGNOSIS — I480 Paroxysmal atrial fibrillation: Secondary | ICD-10-CM | POA: Diagnosis not present

## 2020-02-19 DIAGNOSIS — I130 Hypertensive heart and chronic kidney disease with heart failure and stage 1 through stage 4 chronic kidney disease, or unspecified chronic kidney disease: Secondary | ICD-10-CM | POA: Diagnosis not present

## 2020-02-19 DIAGNOSIS — I8310 Varicose veins of unspecified lower extremity with inflammation: Secondary | ICD-10-CM | POA: Diagnosis not present

## 2020-02-19 DIAGNOSIS — M810 Age-related osteoporosis without current pathological fracture: Secondary | ICD-10-CM | POA: Diagnosis not present

## 2020-02-19 DIAGNOSIS — M5416 Radiculopathy, lumbar region: Secondary | ICD-10-CM | POA: Diagnosis not present

## 2020-02-19 DIAGNOSIS — J441 Chronic obstructive pulmonary disease with (acute) exacerbation: Secondary | ICD-10-CM | POA: Diagnosis not present

## 2020-02-19 DIAGNOSIS — J9620 Acute and chronic respiratory failure, unspecified whether with hypoxia or hypercapnia: Secondary | ICD-10-CM | POA: Diagnosis not present

## 2020-02-19 DIAGNOSIS — I5032 Chronic diastolic (congestive) heart failure: Secondary | ICD-10-CM | POA: Diagnosis not present

## 2020-02-19 DIAGNOSIS — N189 Chronic kidney disease, unspecified: Secondary | ICD-10-CM | POA: Diagnosis not present

## 2020-02-20 DIAGNOSIS — I5032 Chronic diastolic (congestive) heart failure: Secondary | ICD-10-CM | POA: Diagnosis not present

## 2020-02-20 DIAGNOSIS — N189 Chronic kidney disease, unspecified: Secondary | ICD-10-CM | POA: Diagnosis not present

## 2020-02-20 DIAGNOSIS — J9620 Acute and chronic respiratory failure, unspecified whether with hypoxia or hypercapnia: Secondary | ICD-10-CM | POA: Diagnosis not present

## 2020-02-20 DIAGNOSIS — M810 Age-related osteoporosis without current pathological fracture: Secondary | ICD-10-CM | POA: Diagnosis not present

## 2020-02-20 DIAGNOSIS — I130 Hypertensive heart and chronic kidney disease with heart failure and stage 1 through stage 4 chronic kidney disease, or unspecified chronic kidney disease: Secondary | ICD-10-CM | POA: Diagnosis not present

## 2020-02-20 DIAGNOSIS — M5416 Radiculopathy, lumbar region: Secondary | ICD-10-CM | POA: Diagnosis not present

## 2020-02-20 DIAGNOSIS — I8310 Varicose veins of unspecified lower extremity with inflammation: Secondary | ICD-10-CM | POA: Diagnosis not present

## 2020-02-20 DIAGNOSIS — I480 Paroxysmal atrial fibrillation: Secondary | ICD-10-CM | POA: Diagnosis not present

## 2020-02-20 DIAGNOSIS — J441 Chronic obstructive pulmonary disease with (acute) exacerbation: Secondary | ICD-10-CM | POA: Diagnosis not present

## 2020-02-21 ENCOUNTER — Ambulatory Visit: Payer: Medicare HMO | Admitting: Primary Care

## 2020-02-21 ENCOUNTER — Ambulatory Visit: Payer: Medicare HMO | Admitting: Emergency Medicine

## 2020-02-22 ENCOUNTER — Encounter: Payer: Self-pay | Admitting: Emergency Medicine

## 2020-02-22 ENCOUNTER — Other Ambulatory Visit: Payer: Self-pay

## 2020-02-22 ENCOUNTER — Ambulatory Visit: Payer: Medicare HMO | Admitting: Emergency Medicine

## 2020-02-22 DIAGNOSIS — R911 Solitary pulmonary nodule: Secondary | ICD-10-CM | POA: Diagnosis not present

## 2020-02-22 DIAGNOSIS — J449 Chronic obstructive pulmonary disease, unspecified: Secondary | ICD-10-CM

## 2020-02-22 DIAGNOSIS — J9611 Chronic respiratory failure with hypoxia: Secondary | ICD-10-CM | POA: Insufficient documentation

## 2020-02-22 MED ORDER — BREZTRI AEROSPHERE 160-9-4.8 MCG/ACT IN AERO: 2.0000 | INHALATION_SPRAY | Freq: Two times a day (BID) | RESPIRATORY_TRACT | 0 refills | Status: DC

## 2020-02-22 NOTE — Assessment & Plan Note (Signed)
Continued slow enlargement of her basilar right middle lobe pulmonary nodule based on the most recent CT from July.  This is almost certainly a slow-growing lung cancer.  We talked about this, the pros, cons of evaluating now versus following.  She would entertain an evaluation if she were symptomatic.  We talked about possible constitutional symptoms, pleural effusion, pleuritic pain, etc.  For now are going to follow the nodule with imaging and hold off on biopsy or discussion of empiric treatment.  She might be a candidate for empiric radiation for palliation depending on how it progresses.  Next CT scan will be July 2022

## 2020-02-22 NOTE — Progress Notes (Signed)
Subjective:    Patient ID: Heather [name], female    DOB: [birth year], 86 y.o.   MRN: [id number]  HPI 85 year old woman with a history of tobacco use and COPD, severe obstruction based on pulmonary function testing 05/31/2013.  I have seen her before in reference to this.  She also has atrial fibrillation, GERD with hiatal hernia, aortic insufficiency, hypothyroidism.  She has been treated with Spiriva and supplemental oxygen in the past. She was admitted with exacerbation of COPD in the setting of COVID-19 viral infection, pneumonitis in October.  She was treated with remdesivir, dexamethasone.  She discharged to home, has had some additional pred since then.  Part of her evaluation included a CT chest done on 10/9 which I have reviewed, shows significant emphysema, stable 3 mm right lower lobe calcified nodule, a rounded anterior medial basilar right middle lobe opacity of unclear significance, possible early nodule.   Her current bronchodilator regimen includes Symbicort, albuterol which she uses a few times a week.  She has used pred but not more than once a year. She went home with O2 but stopped using it and is not seeing any desats. She is not having much wheeze or cough. No sputum production.   ROV 04/26/2019 --follow-up visit for 86 year old woman with COPD and severe obstruction.  I saw her last month for this and also for a right middle lobe opacity, possible evolving pulmonary nodule.  We stopped Symbicort and started Stiolto to see if she would get additional benefit.  She reports today that initially no real change, but then after about 2 weeks her breathing worsened. She stopped it and went back to Symbicort. She has recovered now back to baseline. Still significant exertional limitation.  Repeat CT scan of the chest was done on 1/22 reviewed by me, shows right perihilar spiculated nodule 1.7 x 1.4 cm, possibly slightly enlarged compared with prior from October 2020.  ROV 02/22/20  --pleasant 86 year old woman whom I have followed for severe COPD (severe obstruction on PFT), hypoxemic respiratory failure ever since she had COVID-19 pneumonitis and 12/2018.  She also has pulmonary nodular disease by CT chest with a right perihilar spiculated nodule suspicious for malignancy that we have followed expectantly.  She was hospitalized in late October with an acute exacerbation of her COPD, seen here 02/13/2020.  She was treated with another prednisone taper at that visit, started on Breztri to see if she would get benefit.  Today she reports that her breathing did improve and she completed the pred. She believes that the [name] has been beneficial. Wondering about continuing it vs doing Symbicort + Spiriva (they have [place] assistance for Symbicort).  Most recent CT was 10/13/2019, reviewed by me, showed severe emphysema and slight increase in size in her medial right middle lobe pulmonary nodule, now 2.0 x 1.3 cm.   Review of Systems As above     Objective:   Physical Exam Vitals:   02/22/20 0933  BP: 126/78  Pulse: (!) 114  Temp: (!) 97 F (36.1 C)  SpO2: 97%  Weight: 103 lb (46.7 kg)  Height: 5\' 3"  (1.6 m)   Gen: Pleasant, thin elderly woman, in no distress,  normal affect  ENT: No lesions,  mouth clear,  oropharynx clear, no postnasal drip  Neck: No JVD, no stridor  Lungs: No use of accessory muscles, decreased BS, no crackles or wheezing on normal respiration, no wheeze on forced expiration  Cardiovascular: RRR, heart sounds normal, no murmur or  gallops, 1+ ankle peripheral edema  Musculoskeletal: No deformities, no cyanosis or clubbing  Neuro: alert, awake, non focal  Skin: Warm, no lesions or rash     Assessment & Plan:  Chronic respiratory failure with hypoxia (HCC) Continue oxygen at 3 L/min pulsed   COPD (chronic obstructive pulmonary disease) ([facility]) Continue Breztri 2 puffs twice a day for now.  Rinse and gargle after using. We will consult  with our clinical pharmacist to see if the combination of Symbicort and Spiriva would be more affordable than Breztri since you have [place] support for the Symbicort Use albuterol either 2 puffs or 1 nebulizer treatment if needed for shortness of breath, chest tightness, wheezing. Follow with Dr Lamonte Sakai in 4 months or sooner if you have any problems.  Pulmonary nodule Continued slow enlargement of her basilar right middle lobe pulmonary nodule based on the most recent CT from July.  This is almost certainly a slow-growing lung cancer.  We talked about this, the pros, cons of evaluating now versus following.  She would entertain an evaluation if she were symptomatic.  We talked about possible constitutional symptoms, pleural effusion, pleuritic pain, etc.  For now are going to follow the nodule with imaging and hold off on biopsy or discussion of empiric treatment.  She might be a candidate for empiric radiation for palliation depending on how it progresses.  Next CT scan will be July 2022  [name], MD, PhD 02/22/2020, 11:06 AM [facility] [phone] or if no answer [phone]

## 2020-02-22 NOTE — Assessment & Plan Note (Signed)
Continue Breztri 2 puffs twice a day for now.  Rinse and gargle after using. We will consult with our clinical pharmacist to see if the combination of Symbicort and Spiriva would be more affordable than Breztri since you have [place] support for the Symbicort Use albuterol either 2 puffs or 1 nebulizer treatment if needed for shortness of breath, chest tightness, wheezing. Follow with Dr Lamonte Sakai in 4 months or sooner if you have any problems.

## 2020-02-22 NOTE — Patient Instructions (Addendum)
Continue Breztri 2 puffs twice a day for now.  Rinse and gargle after using. We will consult with our clinical pharmacist to see if the combination of Symbicort and Spiriva would be more affordable than Breztri Use albuterol either 2 puffs or 1 nebulizer treatment if needed for shortness of breath, chest tightness, wheezing. Continue oxygen at 3 L/min pulsed We will plan to repeat your CT scan of the chest without contrast in July 2022 to follow your pulmonary nodule Follow with Dr Lamonte Sakai in 4 months or sooner if you have any problems.

## 2020-02-22 NOTE — Assessment & Plan Note (Signed)
Continue oxygen at 3 L/min pulsed

## 2020-02-27 ENCOUNTER — Emergency Department (HOSPITAL_COMMUNITY): Payer: Medicare HMO

## 2020-02-27 ENCOUNTER — Other Ambulatory Visit: Payer: Self-pay

## 2020-02-27 ENCOUNTER — Observation Stay (HOSPITAL_COMMUNITY)
Admission: EM | Admit: 2020-02-27 | Discharge: 2020-02-28 | Disposition: A | Discharge status: Home / Self Care | Payer: Medicare HMO | Attending: Internal Medicine | Admitting: Internal Medicine

## 2020-02-27 ENCOUNTER — Encounter (HOSPITAL_COMMUNITY): Payer: Self-pay | Admitting: Emergency Medicine

## 2020-02-27 DIAGNOSIS — L8990 Pressure ulcer of unspecified site, unspecified stage: Secondary | ICD-10-CM | POA: Insufficient documentation

## 2020-02-27 DIAGNOSIS — Z87891 Personal history of nicotine dependence: Secondary | ICD-10-CM | POA: Diagnosis not present

## 2020-02-27 DIAGNOSIS — I4891 Unspecified atrial fibrillation: Secondary | ICD-10-CM | POA: Diagnosis present

## 2020-02-27 DIAGNOSIS — Z79899 Other long term (current) drug therapy: Secondary | ICD-10-CM | POA: Insufficient documentation

## 2020-02-27 DIAGNOSIS — R0902 Hypoxemia: Secondary | ICD-10-CM | POA: Diagnosis not present

## 2020-02-27 DIAGNOSIS — I10 Essential (primary) hypertension: Secondary | ICD-10-CM | POA: Diagnosis not present

## 2020-02-27 DIAGNOSIS — Z7901 Long term (current) use of anticoagulants: Secondary | ICD-10-CM

## 2020-02-27 DIAGNOSIS — J9621 Acute and chronic respiratory failure with hypoxia: Secondary | ICD-10-CM | POA: Diagnosis not present

## 2020-02-27 DIAGNOSIS — J449 Chronic obstructive pulmonary disease, unspecified: Secondary | ICD-10-CM | POA: Diagnosis present

## 2020-02-27 DIAGNOSIS — I499 Cardiac arrhythmia, unspecified: Secondary | ICD-10-CM | POA: Diagnosis not present

## 2020-02-27 DIAGNOSIS — I480 Paroxysmal atrial fibrillation: Secondary | ICD-10-CM | POA: Diagnosis present

## 2020-02-27 DIAGNOSIS — Z96642 Presence of left artificial hip joint: Secondary | ICD-10-CM | POA: Insufficient documentation

## 2020-02-27 DIAGNOSIS — R0609 Other forms of dyspnea: Secondary | ICD-10-CM | POA: Diagnosis present

## 2020-02-27 DIAGNOSIS — Z7951 Long term (current) use of inhaled steroids: Secondary | ICD-10-CM | POA: Insufficient documentation

## 2020-02-27 DIAGNOSIS — E039 Hypothyroidism, unspecified: Secondary | ICD-10-CM | POA: Diagnosis not present

## 2020-02-27 DIAGNOSIS — J441 Chronic obstructive pulmonary disease with (acute) exacerbation: Secondary | ICD-10-CM | POA: Diagnosis not present

## 2020-02-27 DIAGNOSIS — R0600 Dyspnea, unspecified: Secondary | ICD-10-CM | POA: Diagnosis present

## 2020-02-27 DIAGNOSIS — R0602 Shortness of breath: Secondary | ICD-10-CM | POA: Diagnosis not present

## 2020-02-27 DIAGNOSIS — Z20822 Contact with and (suspected) exposure to covid-19: Secondary | ICD-10-CM | POA: Insufficient documentation

## 2020-02-27 DIAGNOSIS — R0689 Other abnormalities of breathing: Secondary | ICD-10-CM | POA: Diagnosis not present

## 2020-02-27 DIAGNOSIS — R000 Tachycardia, unspecified: Secondary | ICD-10-CM | POA: Diagnosis not present

## 2020-02-27 LAB — CBC WITH DIFFERENTIAL/PLATELET
Abs Immature Granulocytes: 0.11 10*3/uL — ABNORMAL HIGH (ref 0.00–0.07)
Basophils Absolute: 0.1 10*3/uL (ref 0.0–0.1)
Basophils Relative: 1 %
Eosinophils Absolute: 0.2 10*3/uL (ref 0.0–0.5)
Eosinophils Relative: 2 %
HCT: 40.1 % (ref 36.0–46.0)
Hemoglobin: 13.3 g/dL (ref 12.0–15.0)
Immature Granulocytes: 1 %
Lymphocytes Relative: 16 %
Lymphs Abs: 1.5 10*3/uL (ref 0.7–4.0)
MCH: 31.9 pg (ref 26.0–34.0)
MCHC: 33.2 g/dL (ref 30.0–36.0)
MCV: 96.2 fL (ref 80.0–100.0)
Monocytes Absolute: 0.7 10*3/uL (ref 0.1–1.0)
Monocytes Relative: 7 %
Neutro Abs: 7.4 10*3/uL (ref 1.7–7.7)
Neutrophils Relative %: 73 %
Platelets: 297 10*3/uL (ref 150–400)
RBC: 4.17 MIL/uL (ref 3.87–5.11)
RDW: 15.5 % (ref 11.5–15.5)
WBC: 10 10*3/uL (ref 4.0–10.5)
nRBC: 0 % (ref 0.0–0.2)

## 2020-02-27 LAB — I-STAT VENOUS BLOOD GAS, ED
Acid-Base Excess: 7 mmol/L — ABNORMAL HIGH (ref 0.0–2.0)
Bicarbonate: 33.8 mmol/L — ABNORMAL HIGH (ref 20.0–28.0)
Calcium, Ion: 1.18 mmol/L (ref 1.15–1.40)
HCT: 39 % (ref 36.0–46.0)
Hemoglobin: 13.3 g/dL (ref 12.0–15.0)
O2 Saturation: 100 %
Potassium: 3.6 mmol/L (ref 3.5–5.1)
Sodium: 138 mmol/L (ref 135–145)
TCO2: 36 mmol/L — ABNORMAL HIGH (ref 22–32)
pCO2, Ven: 56.2 mmHg (ref 44.0–60.0)
pH, Ven: 7.388 (ref 7.250–7.430)
pO2, Ven: 179 mmHg — ABNORMAL HIGH (ref 32.0–45.0)

## 2020-02-27 LAB — COMPREHENSIVE METABOLIC PANEL
ALT: 20 U/L (ref 0–44)
AST: 24 U/L (ref 15–41)
Albumin: 3.3 g/dL — ABNORMAL LOW (ref 3.5–5.0)
Alkaline Phosphatase: 69 U/L (ref 38–126)
Anion gap: 12 (ref 5–15)
BUN: 14 mg/dL (ref 8–23)
CO2: 30 mmol/L (ref 22–32)
Calcium: 9.1 mg/dL (ref 8.9–10.3)
Chloride: 97 mmol/L — ABNORMAL LOW (ref 98–111)
Creatinine, Ser: 0.71 mg/dL (ref 0.44–1.00)
GFR, Estimated: >60 mL/min (ref >60)
Glucose, Bld: 139 mg/dL — ABNORMAL HIGH (ref 70–99)
Potassium: 3.6 mmol/L (ref 3.5–5.1)
Sodium: 139 mmol/L (ref 135–145)
Total Bilirubin: 0.9 mg/dL (ref 0.3–1.2)
Total Protein: 5.7 g/dL — ABNORMAL LOW (ref 6.5–8.1)

## 2020-02-27 LAB — RESP PANEL BY RT-PCR (FLU A&B, COVID) ARPGX2
Influenza A by PCR: NEGATIVE
Influenza B by PCR: NEGATIVE
SARS Coronavirus 2 by RT PCR: NEGATIVE

## 2020-02-27 LAB — TROPONIN I (HIGH SENSITIVITY)
Troponin I (High Sensitivity): 12 ng/L (ref <18)
Troponin I (High Sensitivity): 13 ng/L (ref <18)

## 2020-02-27 LAB — BRAIN NATRIURETIC PEPTIDE: B Natriuretic Peptide: 67.7 pg/mL (ref 0.0–100.0)

## 2020-02-27 MED ORDER — IPRATROPIUM-ALBUTEROL 0.5-2.5 (3) MG/3ML IN SOLN
3.0000 mL | Freq: Three times a day (TID) | RESPIRATORY_TRACT | Status: DC
  Administered 2020-02-28: 3 mL via RESPIRATORY_TRACT
  Filled 2020-02-27 (×3): qty 3

## 2020-02-27 MED ORDER — LEVOTHYROXINE SODIUM 25 MCG PO TABS
137.0000 ug | ORAL_TABLET | Freq: Every day | ORAL | Status: DC
  Administered 2020-02-28: 137 ug via ORAL
  Filled 2020-02-27 (×2): qty 1

## 2020-02-27 MED ORDER — ENSURE ENLIVE PO LIQD
237.0000 mL | Freq: Two times a day (BID) | ORAL | Status: DC
  Administered 2020-02-28: 237 mL via ORAL
  Filled 2020-02-27 (×2): qty 237

## 2020-02-27 MED ORDER — DILTIAZEM HCL ER COATED BEADS 180 MG PO CP24
180.0000 mg | ORAL_CAPSULE | Freq: Every day | ORAL | Status: DC
  Administered 2020-02-27 – 2020-02-28 (×2): 180 mg via ORAL
  Filled 2020-02-27 (×3): qty 1

## 2020-02-27 MED ORDER — FLUTICASONE FUROATE-VILANTEROL 200-25 MCG/INH IN AEPB
1.0000 | INHALATION_SPRAY | Freq: Every day | RESPIRATORY_TRACT | Status: DC
  Filled 2020-02-27: qty 28

## 2020-02-27 MED ORDER — ACETAMINOPHEN 325 MG PO TABS: 325.0000 mg | ORAL_TABLET | Freq: Four times a day (QID) | ORAL | Status: DC | PRN

## 2020-02-27 MED ORDER — ONDANSETRON HCL 4 MG PO TABS: 4.0000 mg | ORAL_TABLET | Freq: Four times a day (QID) | ORAL | Status: DC | PRN

## 2020-02-27 MED ORDER — ALBUTEROL SULFATE HFA 108 (90 BASE) MCG/ACT IN AERS: 2.0000 | INHALATION_SPRAY | Freq: Four times a day (QID) | RESPIRATORY_TRACT | Status: DC | PRN

## 2020-02-27 MED ORDER — NYSTATIN 100000 UNIT/ML MT SUSP
5.0000 mL | Freq: Four times a day (QID) | OROMUCOSAL | Status: DC
  Administered 2020-02-28: 500000 [IU] via ORAL
  Filled 2020-02-27 (×7): qty 5

## 2020-02-27 MED ORDER — IPRATROPIUM-ALBUTEROL 0.5-2.5 (3) MG/3ML IN SOLN
3.0000 mL | Freq: Once | RESPIRATORY_TRACT | Status: AC
  Administered 2020-02-27: 3 mL via RESPIRATORY_TRACT
  Filled 2020-02-27 (×2): qty 3

## 2020-02-27 MED ORDER — ACETAMINOPHEN 650 MG RE SUPP: 325.0000 mg | Freq: Four times a day (QID) | RECTAL | Status: DC | PRN

## 2020-02-27 MED ORDER — METHYLPREDNISOLONE SODIUM SUCC 125 MG IJ SOLR
62.0000 mg | Freq: Once | INTRAMUSCULAR | Status: AC
  Administered 2020-02-27: 62 mg via INTRAVENOUS
  Filled 2020-02-27: qty 2

## 2020-02-27 MED ORDER — IPRATROPIUM BROMIDE HFA 17 MCG/ACT IN AERS
2.0000 | INHALATION_SPRAY | RESPIRATORY_TRACT | Status: DC | PRN
  Filled 2020-02-27: qty 12.9

## 2020-02-27 MED ORDER — BACID PO TABS
1.0000 | ORAL_TABLET | Freq: Every day | ORAL | Status: DC
  Administered 2020-02-28: 1 via ORAL
  Filled 2020-02-27: qty 1

## 2020-02-27 MED ORDER — ALBUTEROL SULFATE HFA 108 (90 BASE) MCG/ACT IN AERS
4.0000 | INHALATION_SPRAY | Freq: Once | RESPIRATORY_TRACT | Status: AC
  Administered 2020-02-27: 4 via RESPIRATORY_TRACT
  Filled 2020-02-27: qty 6.7

## 2020-02-27 MED ORDER — ONDANSETRON HCL 4 MG/2ML IJ SOLN: 4.0000 mg | Freq: Four times a day (QID) | INTRAMUSCULAR | Status: DC | PRN

## 2020-02-27 MED ORDER — LOSARTAN POTASSIUM 25 MG PO TABS
25.0000 mg | ORAL_TABLET | Freq: Every day | ORAL | Status: DC
  Administered 2020-02-27 – 2020-02-28 (×2): 25 mg via ORAL
  Filled 2020-02-27 (×2): qty 1

## 2020-02-27 MED ORDER — MAGNESIUM OXIDE 400 (241.3 MG) MG PO TABS
400.0000 mg | ORAL_TABLET | Freq: Every day | ORAL | Status: DC
  Administered 2020-02-27 – 2020-02-28 (×2): 400 mg via ORAL
  Filled 2020-02-27 (×2): qty 1

## 2020-02-27 MED ORDER — POLYETHYLENE GLYCOL 3350 17 G PO PACK
17.0000 g | PACK | Freq: Every day | ORAL | Status: DC
  Administered 2020-02-27 – 2020-02-28 (×2): 17 g via ORAL
  Filled 2020-02-27 (×2): qty 1

## 2020-02-27 MED ORDER — ALBUTEROL SULFATE (2.5 MG/3ML) 0.083% IN NEBU: 2.5000 mg | INHALATION_SOLUTION | Freq: Four times a day (QID) | RESPIRATORY_TRACT | Status: DC | PRN

## 2020-02-27 MED ORDER — PREDNISONE 10 MG PO TABS: ORAL_TABLET | ORAL | 0 refills | Status: AC

## 2020-02-27 MED ORDER — CALCIUM CARBONATE-VITAMIN D 500-200 MG-UNIT PO TABS
1.0000 | ORAL_TABLET | Freq: Every day | ORAL | Status: DC
  Administered 2020-02-28: 1 via ORAL
  Filled 2020-02-27 (×3): qty 1

## 2020-02-27 MED ORDER — FUROSEMIDE 10 MG/ML IJ SOLN
40.0000 mg | Freq: Every day | INTRAMUSCULAR | Status: DC
  Administered 2020-02-27 – 2020-02-28 (×2): 40 mg via INTRAVENOUS
  Filled 2020-02-27 (×2): qty 4

## 2020-02-27 MED ORDER — TERIPARATIDE 620 MCG/2.48ML ~~LOC~~ SOPN: 20.0000 ug | PEN_INJECTOR | Freq: Every day | SUBCUTANEOUS | Status: DC

## 2020-02-27 MED ORDER — METHYLPREDNISOLONE SODIUM SUCC 125 MG IJ SOLR
60.0000 mg | Freq: Two times a day (BID) | INTRAMUSCULAR | Status: AC
  Administered 2020-02-27 – 2020-02-28 (×2): 60 mg via INTRAVENOUS
  Filled 2020-02-27 (×2): qty 2

## 2020-02-27 MED ORDER — IPRATROPIUM-ALBUTEROL 0.5-2.5 (3) MG/3ML IN SOLN
3.0000 mL | Freq: Four times a day (QID) | RESPIRATORY_TRACT | Status: DC
  Administered 2020-02-27: 3 mL via RESPIRATORY_TRACT
  Filled 2020-02-27: qty 3

## 2020-02-27 MED ORDER — VITAMIN D 25 MCG (1000 UNIT) PO TABS
1000.0000 [IU] | ORAL_TABLET | Freq: Every day | ORAL | Status: DC
  Administered 2020-02-27 – 2020-02-28 (×2): 1000 [IU] via ORAL
  Filled 2020-02-27 (×2): qty 1

## 2020-02-27 MED ORDER — GUAIFENESIN-DM 100-10 MG/5ML PO SYRP: 5.0000 mL | ORAL_SOLUTION | Freq: Four times a day (QID) | ORAL | Status: DC | PRN

## 2020-02-27 MED ORDER — FLUTICASONE PROPIONATE 50 MCG/ACT NA SUSP
2.0000 | Freq: Every day | NASAL | Status: DC
  Filled 2020-02-27: qty 16

## 2020-02-27 MED ORDER — PREDNISONE 20 MG PO TABS
40.0000 mg | ORAL_TABLET | Freq: Every day | ORAL | Status: DC
  Administered 2020-02-28: 40 mg via ORAL
  Filled 2020-02-27 (×2): qty 2

## 2020-02-27 MED ORDER — IPRATROPIUM BROMIDE HFA 17 MCG/ACT IN AERS
2.0000 | INHALATION_SPRAY | Freq: Once | RESPIRATORY_TRACT | Status: AC
  Administered 2020-02-27: 2 via RESPIRATORY_TRACT
  Filled 2020-02-27: qty 12.9

## 2020-02-27 MED ORDER — ADULT MULTIVITAMIN W/MINERALS CH
1.0000 | ORAL_TABLET | Freq: Every day | ORAL | Status: DC
  Administered 2020-02-27 – 2020-02-28 (×2): 1 via ORAL
  Filled 2020-02-27 (×2): qty 1

## 2020-02-27 MED ORDER — APIXABAN 2.5 MG PO TABS
2.5000 mg | ORAL_TABLET | Freq: Two times a day (BID) | ORAL | Status: DC
  Administered 2020-02-27 – 2020-02-28 (×3): 2.5 mg via ORAL
  Filled 2020-02-27 (×5): qty 1

## 2020-02-27 NOTE — ED Triage Notes (Addendum)
Pt brought to ED via EMS from home with c/o shortness of breath. Pt reports she used home neb and shortness of breath worsened. Per EMS, pt 99% on 3L [place]. Pt uses 2L [place] PRN at home for COPD, asthma. Pt a&o x4, denies pain, takes eliquis for afib.  EMS v/s: 136/90 106 HR 150 CBG 98.4 tympanic

## 2020-02-27 NOTE — ED Notes (Signed)
Dinner Tray Ordered @ 1716. 

## 2020-02-27 NOTE — H&P (Signed)
History and Physical   Heather [name] EXB:[id number] DOB: 1926/08/04 DOA: 02/27/2020  PCP: Burnard Bunting, MD  Outpatient Specialists: [facility], Dr. Lamonte Sakai and [facility] Patient coming from: home   I have personally briefly reviewed patient's old medical records in [place].  Chief Concern: worsening shortness of breath  HPI: Heather [name] is a 86 y.o. female with medical history significant for paroxysmal atrial fibrillation, COPD, GERD, neuropathy, hypertension, known diplopia follows with neurology, acquired hypothyroid, presents to the emergency department for chief concerns of worsening shortness of breath.  Per patient and daughter at bedside, patient states that she finished prednisone taper pack (per outpatient pulmonology) on Monday and started feeling SOB Thursday evening and gradually worsened until today when she was not able to talk or take her meds  Per daughter, patient's SPO2 was 87% on oxygen (enogen) with EMS.  ROS was negative for new cough, denies fever, chills, chest pain, new vision changes, headache, new abdominal pain, constipation, diarrhea, dysuria, hematuria, urgency, frequency. Endorses baseline lower extremity swelling and no new changes.   She states she takes miralax everyday at baseline. Last BM 02/26/20 and it was normal.   Social history: lives with daughter and son-in-law.Quit smoking 1980. Formerly smoked 1 ppd. Denies etoh and recreational drugs  ED Course: Discussed with ED provider, requesting admission for COPD exacerbation.  Review of Systems: As per HPI otherwise 10 point review of systems negative.  Assessment/Plan  Principal Problem:   Acute on chronic respiratory failure with hypoxia (HCC) Active Problems:   PAF (paroxysmal atrial fibrillation) (HCC)   Long term current use of anticoagulant therapy   HTN (hypertension)   Dyspnea on exertion   COPD (chronic obstructive pulmonary disease) (HCC)    Hypothyroidism   COPD exacerbation (HCC)   Acute on chronic respiratory failure with hypoxia suspect secondary to COPD exacerbation -DuoNeb scheduled -Status post Solu-Medrol 62 mg IV once, albuterol 4 puffs once, Atrovent 2 puffs, duo nebs once, in the ED with some improvement -DuoNeb scheduled QID with last treatment at 10 PM to limit interruptions during sleep -Albuterol nebulizer every 6 hours as needed for wheezing and shortness of breath -Ipratropium 2 puffs every 4 hours as needed for shortness of breath and/or wheezing -TOC consulted for home oxygen -Solu-Medrol 60 mg IV every 12 hours for 1 day, titrate to prednisone 40 mg daily for 2 days -Per ED provider, he spoke with Dr. Lamonte Sakai and Dr. Lamonte Sakai wants prednisone titrated down with goals of chronic prednisone 10 mg daily on outpatient -Primary team to consult pulmonology specialist if indicated  Thrush-present on admission, -Nystatin swish and spit/swallow 4 times daily  Paroxysmal A. fib-resumed home Eliquis  Primary hypertension  Basilar right middle lobe pulmonary nodule-known -Pulmonologist is following  Chart reviewed.   DVT prophylaxis: On Eliquis Code Status: Dnr Diet: Heart healthy diet Family Communication: Updated daughter at bedside Disposition Plan: Pending clinical course Consults called: None at this time Admission status: Observation with telemetry  Past Medical History:  Diagnosis Date  . Anxiety   . Arthritis   . Atrial fibrillation ([place])   . Cerumen impaction   . COPD (chronic obstructive pulmonary disease) ([place])   . Dysrhythmia    AF  . Essential hypertension, benign   . Gallstones   . GERD (gastroesophageal reflux disease)   . H/O hiatal hernia   . History of nuclear stress test 10/2010   dipyridamole; normal pattern of perfusion; low risk, normal study  . 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 Mild aortic insufficiency   . Osteoporosis   . Peripheral  neuropathy   . Pneumonia    states she had it twice, last time a couple of months ago  . PONV (postoperative nausea and vomiting)   . Shortness of breath    with exertion  . Unspecified hypothyroidism   . Venous insufficiency    Past Surgical History:  Procedure Laterality Date  . APPENDECTOMY  1935  . BACK SURGERY     x2  . Cardiometablic Testing  2/50/5397   submaximal effort with peak RER of 0.5, peak VO2 79% predicted; HR peak up to 78%; PVC was 55% predicted, PEV1 39% predicted; PEV1/VC ratio was reduced; normal vital capacity; DLCO was reduced to 63%  . CARPAL TUNNEL RELEASE    . CATARACT EXTRACTION W/ INTRAOCULAR LENS  IMPLANT, BILATERAL    . CHOLECYSTECTOMY  04/07/2014   dr toth  . CHOLECYSTECTOMY N/A 04/07/2014   Procedure: LAPAROSCOPIC CHOLECYSTECTOMY WITH INTRAOPERATIVE CHOLANGIOGRAM POSSBILE OPEN;  Surgeon: Autumn Messing III, MD;  Location: [place];  Service: General;  Laterality: N/A;  . COLON SURGERY  2008   partial  . ESOPHAGOGASTRODUODENOSCOPY (EGD) WITH PROPOFOL N/A 05/25/2017   Procedure: ESOPHAGOGASTRODUODENOSCOPY (EGD) WITH PROPOFOL;  Surgeon: Clarene Essex, MD;  Location: [place];  Service: Endoscopy;  Laterality: N/A;  . HERNIA REPAIR    . JOINT REPLACEMENT    . left hip relacement  2000  . SAVORY DILATION N/A 05/25/2017   Procedure: SAVORY DILATION;  Surgeon: Clarene Essex, MD;  Location: [facility] ENDOSCOPY;  Service: Endoscopy;  Laterality: N/A;  . TONSILLECTOMY  1935  . TOTAL ABDOMINAL HYSTERECTOMY  1970  . TRANSTHORACIC ECHOCARDIOGRAM  05/2011   EF=>55%; mild conc LVH; mild mitral annular calcif; mild TR; AV mildly sclerotic & mild AR  . VENTRAL HERNIA REPAIR  09/19/2011   Procedure: LAPAROSCOPIC VENTRAL HERNIA;  Surgeon: Merrie Roof, MD;  Location: [place];  Service: General;  Laterality: N/A;  laparoscopic ventral hernia repair with mesh   Social History:  reports that she quit smoking about 41 years ago. Her smoking use included cigarettes. She started smoking  about 61 years ago. She has a 20.00 pack-year smoking history. She has never used smokeless tobacco. She reports previous alcohol use. She reports that she does not use drugs.  Allergies  Allergen Reactions  . Ace Inhibitors Cough  . Sulfur Nausea And Vomiting   Family History  Problem Relation Age of Onset  . Stroke Mother   . Stroke Father   . Heart block Brother   . Bladder Cancer Brother   . Diabetes Brother   . Stroke Brother    Family history: Family history reviewed and not pertinent  Prior to Admission medications   Medication Sig Start Date End Date Taking? Authorizing Provider  albuterol (PROVENTIL) (2.5 MG/3ML) 0.083% nebulizer solution Take 2.5 mg by nebulization every 6 (six) hours as needed for wheezing or shortness of breath.   Yes [provider]  albuterol (VENTOLIN HFA) 108 (90 Base) MCG/ACT inhaler Inhale 2 puffs into the lungs every 6 (six) hours as needed for wheezing or shortness of breath. 04/26/19  Yes Byrum, Rose Fillers, MD  Budeson-Glycopyrrol-Formoterol (BREZTRI AEROSPHERE) 160-9-4.8 MCG/ACT AERO Inhale 2 puffs into the lungs in the morning and at bedtime. 02/22/20  Yes Collene Gobble, MD  budesonide-formoterol (SYMBICORT) 160-4.5 MCG/ACT inhaler Inhale 1 puff into the lungs 2 (two) times daily. 02/02/20  Yes Collene Gobble, MD  calcium citrate-vitamin D (CITRACAL+D) 315-200 MG-UNIT per tablet Take 1 tablet by mouth daily.   Yes [provider]  Carboxymethylcellulose Sodium (THERATEARS OP) Place 2 drops into both eyes daily.    Yes [provider]  cholecalciferol (VITAMIN D3) 25 MCG (1000 UNIT) tablet Take 1,000 Units by mouth daily.   Yes [provider]  diltiazem (CARDIZEM CD) 180 MG 24 hr capsule Take 180 mg by mouth daily.    Yes [provider]  ELIQUIS 2.5 MG TABS tablet Take 1 tablet by mouth twice daily Patient taking differently: Take 2.5 mg by mouth 2 (two) times daily.  11/03/19  Yes Hilty, Nadean Corwin, MD    feeding supplement, ENSURE ENLIVE, (ENSURE ENLIVE) LIQD Take 237 mLs by mouth 2 (two) times daily between meals. 10/21/19  Yes Shah, Pratik D, DO  fluticasone (FLONASE) 50 MCG/ACT nasal spray Place 2 sprays into both nostrils at bedtime.  06/21/19  Yes [provider]  furosemide (LASIX) 40 MG tablet Take 40 mg by mouth daily.  11/22/19  Yes [provider]  guaiFENesin-dextromethorphan (ROBITUSSIN DM) 100-10 MG/5ML syrup Take 5 mLs by mouth every 6 (six) hours as needed for cough. Patient taking differently: Take 10 mLs by mouth every 6 (six) hours as needed for cough.  01/11/19  Yes Arrien, Jimmy Picket, MD  KLOR-CON M20 20 MEQ tablet Take 20 mEq by mouth daily.  12/24/19  Yes [provider]  Lactobacillus Reuteri (BIOGAIA PROBIOTIC PO) Take 1 capsule by mouth daily.   Yes [provider]  levothyroxine (SYNTHROID, LEVOTHROID) 137 MCG tablet Take 137 mcg by mouth daily before breakfast.    Yes [provider]  losartan (COZAAR) 25 MG tablet Take 25 mg by mouth daily.  12/28/19  Yes [provider]  magnesium oxide (MAG-OX) 400 MG tablet Take 400 mg by mouth daily.   Yes [provider]  Melatonin 10 MG CAPS Take 10 mg by mouth at bedtime.   Yes [provider]  Multiple Vitamins-Minerals (MULTIVITAMIN WITH MINERALS) tablet Take 1 tablet by mouth daily.   Yes [provider]  polyethylene glycol (MIRALAX / GLYCOLAX) packet Take 17 g by mouth daily.    Yes [provider]  Teriparatide, Recombinant, (FORTEO) 620 MCG/2.48ML SOPN Inject 20 mcg into the skin daily before breakfast.    Yes [provider]  acetaminophen (TYLENOL) 325 MG tablet Take 2 tablets (650 mg total) by mouth every 6 (six) hours as needed for mild pain or headache (fever >/= 101). Patient not taking: Reported on 02/27/2020 01/11/19   Arrien, Jimmy Picket, MD  Budeson-Glycopyrrol-Formoterol (BREZTRI AEROSPHERE) 160-9-4.8 MCG/ACT  AERO Inhale 2 puffs into the lungs 2 (two) times daily. Patient not taking: Reported on 02/27/2020 02/13/20   Magdalen Spatz, NP  predniSONE (DELTASONE) 10 MG tablet Take 4 tablets (40 mg total) by mouth daily with breakfast for 2 days, THEN 3 tablets (30 mg total) daily with breakfast for 2 days, THEN 2 tablets (20 mg total) daily with breakfast for 2 days, THEN 1 tablet (10 mg total) daily with breakfast for 27 days. 02/27/20 03/31/20  Lorayne Bender, PA-C   Physical Exam: Vitals:   02/27/20 1330 02/27/20 1345 02/27/20 1400 02/27/20 1415  BP: (!) 118/58 111/61 100/60 121/67  Pulse: (!) 109 (!) 109 (!) 110 (!) 108  Resp: (!) 27 (!) 21 19 (!) 27  Temp:      TempSrc:      SpO2: 97% 96% 92% 94%  Constitutional: appears age-appropriate, NAD, calm, comfortable Eyes: PERRL, lids and conjunctivae normal ENMT: Mucous membranes are moist. Posterior pharynx clear of any exudate or lesions. Age-appropriate dentition. Hearing appropriate for age Neck: normal, supple, no masses, no thyromegaly Respiratory: clear to auscultation bilaterally, no wheezing, no crackles. Normal respiratory effort. No accessory muscle use.  Cardiovascular: Regular rate and rhythm, no murmurs / rubs / gallops. No extremity edema. 2+ pedal pulses. No carotid bruits.  Abdomen: no tenderness, no masses palpated, no hepatosplenomegaly. Bowel sounds positive.  Musculoskeletal: no clubbing / cyanosis. No joint deformity upper and lower extremities. Good ROM, no contractures, no atrophy. Normal muscle tone.  Skin: no rashes, lesions, ulcers. No induration Neurologic: Sensation intact. Strength 5/5 in all 4.  Psychiatric: Normal judgment and insight. Alert and oriented x 3. Normal mood.   EKG: Independently reviewed, showing sinus tachycardia with rate of 103, QTc 477  Chest x-ray on Admission: Personally reviewed and I agree with radiologist reading as below.  DG Chest Port 1 View  Result Date: 02/27/2020 CLINICAL DATA:   Shortness of breath. EXAM: PORTABLE CHEST 1 VIEW COMPARISON:  February 13, 2020. FINDINGS: The heart size and mediastinal contours are within normal limits. Both lungs are clear. No pneumothorax or pleural effusion is noted. Stable elevated right hemidiaphragm is noted. The visualized skeletal structures are unremarkable. IMPRESSION: No active disease. Aortic Atherosclerosis (ICD10-I70.0). Electronically Signed   By: Marijo Conception M.D.   On: 02/27/2020 09:50   Labs on Admission: I have personally reviewed following labs  CBC: Recent Labs  Lab 02/27/20 0832 02/27/20 0912  WBC 10.0  --   NEUTROABS 7.4  --   HGB 13.3 13.3  HCT 40.1 39.0  MCV 96.2  --   PLT 297  --    Basic Metabolic Panel: Recent Labs  Lab 02/27/20 0832 02/27/20 0912  NA 139 138  K 3.6 3.6  CL 97*  --   CO2 30  --   GLUCOSE 139*  --   BUN 14  --   CREATININE 0.71  --   CALCIUM 9.1  --    GFR: Estimated Creatinine Clearance: 33.8 mL/min (by C-G formula based on SCr of 0.71 mg/dL). Liver Function Tests: Recent Labs  Lab 02/27/20 0832  AST 24  ALT 20  ALKPHOS 69  BILITOT 0.9  PROT 5.7*  ALBUMIN 3.3*   Urine analysis:    Component Value Date/Time   COLORURINE YELLOW 01/25/2020 0250   APPEARANCEUR CLEAR 01/25/2020 0250   LABSPEC 1.018 01/25/2020 0250   PHURINE 6.0 01/25/2020 0250   GLUCOSEU 150 (A) 01/25/2020 0250   HGBUR NEGATIVE 01/25/2020 0250   BILIRUBINUR NEGATIVE 01/25/2020 0250   KETONESUR NEGATIVE 01/25/2020 0250   PROTEINUR NEGATIVE 01/25/2020 0250   UROBILINOGEN 0.2 05/17/2013 1207   NITRITE NEGATIVE 01/25/2020 0250   LEUKOCYTESUR NEGATIVE 01/25/2020 0250   Shaleka Brines N Kirstina Leinweber D.O. [facility]  If 12AM-7AM, please contact overnight-coverage provider If 7AM-7PM, please contact day coverage provider [email or web address]  02/27/2020, 2:49 PM

## 2020-02-27 NOTE — ED Notes (Signed)
Snack given  Daughter at bedside

## 2020-02-27 NOTE — ED Provider Notes (Signed)
[facility] EMERGENCY DEPARTMENT Provider Note   CSN: [id number] Arrival date & time: 02/27/20  0751     History Chief Complaint  Patient presents with  . Shortness of Breath    Heather [name] is a 86 y.o. female.  The history is provided by the patient and the EMS personnel.        Heather [name] is a 86 y.o. female, with a history of A. fib, COPD, GERD, venous insufficiency, COVID-19 infection, presenting to the ED with shortness of breath worsening over the last 2 to 3 days. She states she has used her albuterol nebulizer and inhaler without improvement. She typically has shortness of breath with exertion and it usually resolves with rest, however, especially over the last 24 hours this has not been the case and her shortness of breath has been persistent.  Reports that she primarily has an [employer] oxygen system at home that she uses, however, this is no longer helping her with her shortness of breath.  Her daughter states there is a supply of tanked oxygen that can be used in emergencies, however, they do not have a consistent supply.  She is not Covid vaccinated, however, she did have Covid infection October 2020.  Denies history of PE/DVT, recent surgery, recent immobilization, recent trauma. Denies fever/chills, recent illness, chest pain, abdominal pain, acute lower extremity edema, acute orthopnea, N/V/D, or any other complaints.  Past Medical History:  Diagnosis Date  . Anxiety   . Arthritis   . Atrial fibrillation ([place])   . Cerumen impaction   . COPD (chronic obstructive pulmonary disease) ([place])   . Dysrhythmia    AF  . Essential hypertension, benign   . Gallstones   . GERD (gastroesophageal reflux disease)   . H/O hiatal hernia   . History of nuclear stress test 10/2010   dipyridamole; normal pattern of perfusion; low risk, normal study  . 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 Mild aortic insufficiency    . Osteoporosis   . Peripheral neuropathy   . Pneumonia    states she had it twice, last time a couple of months ago  . PONV (postoperative nausea and vomiting)   . Shortness of breath    with exertion  . Unspecified hypothyroidism   . Venous insufficiency     Patient Active Problem List   Diagnosis Date Noted  . Chronic respiratory failure with hypoxia ([place]) 02/22/2020  . Pulmonary nodule 03/29/2019  . Pneumonia due to COVID-19 virus 01/06/2019  . Hypotension 01/05/2019  . Protein-calorie malnutrition, severe 04/26/2018  . Skin avulsion 09/19/2016  . Influenza B 04/16/2016  . Sepsis ([place]) 04/16/2016  . Hypokalemia 04/16/2016  . Hypothyroidism 04/16/2016  . Post herpetic neuralgia 06/06/2014  . Shingles outbreak 04/19/2014  . Gallstones 09/20/2013  . COPD (chronic obstructive pulmonary disease) ([place]) 05/17/2013  . Dyspnea on exertion 04/25/2013  . Pulmonary emphysema ([place]) 11/15/2012  . HTN (hypertension) 11/15/2012  . PAF (paroxysmal atrial fibrillation) ([place]) 06/14/2012  . Long term current use of anticoagulant therapy 06/14/2012  . Ventral hernia 07/31/2011  . Abdominal wall mass 07/01/2011    Past Surgical History:  Procedure Laterality Date  . APPENDECTOMY  1935  . BACK SURGERY     x2  . Cardiometablic Testing  7/82/9562   submaximal effort with peak RER of 0.5, peak VO2 79% predicted; HR peak up to 78%; PVC was 55% predicted, PEV1 39% predicted; PEV1/VC ratio was reduced; normal vital capacity; DLCO was  reduced to 63%  . CARPAL TUNNEL RELEASE    . CATARACT EXTRACTION W/ INTRAOCULAR LENS  IMPLANT, BILATERAL    . CHOLECYSTECTOMY  04/07/2014   dr toth  . CHOLECYSTECTOMY N/A 04/07/2014   Procedure: LAPAROSCOPIC CHOLECYSTECTOMY WITH INTRAOPERATIVE CHOLANGIOGRAM POSSBILE OPEN;  Surgeon: Autumn Messing III, MD;  Location: [facility];  Service: General;  Laterality: N/A;  . COLON SURGERY  2008   partial  . ESOPHAGOGASTRODUODENOSCOPY (EGD) WITH PROPOFOL N/A 05/25/2017    Procedure: ESOPHAGOGASTRODUODENOSCOPY (EGD) WITH PROPOFOL;  Surgeon: Clarene Essex, MD;  Location: [facility];  Service: Endoscopy;  Laterality: N/A;  . HERNIA REPAIR    . JOINT REPLACEMENT    . left hip relacement  2000  . SAVORY DILATION N/A 05/25/2017   Procedure: SAVORY DILATION;  Surgeon: Clarene Essex, MD;  Location: [facility] ENDOSCOPY;  Service: Endoscopy;  Laterality: N/A;  . TONSILLECTOMY  1935  . TOTAL ABDOMINAL HYSTERECTOMY  1970  . TRANSTHORACIC ECHOCARDIOGRAM  05/2011   EF=>55%; mild conc LVH; mild mitral annular calcif; mild TR; AV mildly sclerotic & mild AR  . VENTRAL HERNIA REPAIR  09/19/2011   Procedure: LAPAROSCOPIC VENTRAL HERNIA;  Surgeon: Merrie Roof, MD;  Location: [facility];  Service: General;  Laterality: N/A;  laparoscopic ventral hernia repair with mesh     OB History   No obstetric history on file.     Family History  Problem Relation Age of Onset  . Stroke Mother   . Stroke Father   . Heart block Brother   . Bladder Cancer Brother   . Diabetes Brother   . Stroke Brother     Social History   Tobacco Use  . Smoking status: Former Smoker    Packs/day: 1.00    Years: 20.00    Pack years: 20.00    Types: Cigarettes    Start date: 45    Quit date: 09/12/1978    Years since quitting: 41.4  . Smokeless tobacco: Never Used  Vaping Use  . Vaping Use: Never used  Substance Use Topics  . Alcohol use: Not Currently    Alcohol/week: 0.0 standard drinks    Comment: occ  . Drug use: No    Home Medications Prior to Admission medications   Medication Sig Start Date End Date Taking? Authorizing Provider  acetaminophen (TYLENOL) 325 MG tablet Take 2 tablets (650 mg total) by mouth every 6 (six) hours as needed for mild pain or headache (fever >/= 101). Patient taking differently: Take 650 mg by mouth every 6 (six) hours as needed for mild pain or headache.  01/11/19   Arrien, Jimmy Picket, MD  acetaminophen (TYLENOL) 650 MG CR tablet Take 650 mg by mouth daily  as needed for pain.     [provider]  albuterol (PROVENTIL) (2.5 MG/3ML) 0.083% nebulizer solution Take 2.5 mg by nebulization every 6 (six) hours as needed for wheezing or shortness of breath.    [provider]  albuterol (VENTOLIN HFA) 108 (90 Base) MCG/ACT inhaler Inhale 2 puffs into the lungs every 6 (six) hours as needed for wheezing or shortness of breath. 04/26/19   Byrum, Rose Fillers, MD  Budeson-Glycopyrrol-Formoterol (BREZTRI AEROSPHERE) 160-9-4.8 MCG/ACT AERO Inhale 2 puffs into the lungs 2 (two) times daily. 02/13/20   Magdalen Spatz, NP  Budeson-Glycopyrrol-Formoterol (BREZTRI AEROSPHERE) 160-9-4.8 MCG/ACT AERO Inhale 2 puffs into the lungs in the morning and at bedtime. 02/22/20   Collene Gobble, MD  budesonide-formoterol (SYMBICORT) 160-4.5 MCG/ACT inhaler Inhale 1 puff into the  lungs 2 (two) times daily. 02/02/20   Collene Gobble, MD  calcium citrate-vitamin D (CITRACAL+D) 315-200 MG-UNIT per tablet Take 1 tablet by mouth daily.    [provider]  Carboxymethylcellulose Sodium (THERATEARS OP) Place 2 drops into both eyes daily.     [provider]  cholecalciferol (VITAMIN D3) 25 MCG (1000 UNIT) tablet Take 1,000 Units by mouth daily.    [provider]  diltiazem (CARDIZEM CD) 180 MG 24 hr capsule Take 180 mg by mouth daily.     [provider]  ELIQUIS 2.5 MG TABS tablet Take 1 tablet by mouth twice daily Patient taking differently: Take 2.5 mg by mouth 2 (two) times daily.  11/03/19   Hilty, Nadean Corwin, MD  feeding supplement, ENSURE ENLIVE, (ENSURE ENLIVE) LIQD Take 237 mLs by mouth 2 (two) times daily between meals. 10/21/19   Manuella Ghazi, Pratik D, DO  fluticasone (FLONASE) 50 MCG/ACT nasal spray Place 2 sprays into both nostrils at bedtime.  06/21/19   [provider]  furosemide (LASIX) 40 MG tablet Take 40 mg by mouth daily.  11/22/19   [provider]  guaiFENesin-dextromethorphan (ROBITUSSIN DM) 100-10 MG/5ML syrup  Take 5 mLs by mouth every 6 (six) hours as needed for cough. Patient taking differently: Take 10 mLs by mouth every 6 (six) hours as needed for cough.  01/11/19   Arrien, Jimmy Picket, MD  KLOR-CON M20 20 MEQ tablet Take 20 mEq by mouth daily.  12/24/19   [provider]  Lactobacillus Reuteri (BIOGAIA PROBIOTIC PO) Take 1 capsule by mouth daily.    [provider]  levothyroxine (SYNTHROID, LEVOTHROID) 137 MCG tablet Take 137 mcg by mouth daily before breakfast.     [provider]  losartan (COZAAR) 25 MG tablet Take 25 mg by mouth daily.  12/28/19   [provider]  magnesium oxide (MAG-OX) 400 MG tablet Take 400 mg by mouth daily.    [provider]  Melatonin 10 MG CAPS Take 10 mg by mouth at bedtime.    [provider]  Multiple Vitamins-Minerals (MULTIVITAMIN WITH MINERALS) tablet Take 1 tablet by mouth daily.    [provider]  polyethylene glycol (MIRALAX / GLYCOLAX) packet Take 17 g by mouth daily.     [provider]  predniSONE (DELTASONE) 10 MG tablet Take 4 tablets (40 mg total) by mouth daily with breakfast for 2 days, THEN 3 tablets (30 mg total) daily with breakfast for 2 days, THEN 2 tablets (20 mg total) daily with breakfast for 2 days, THEN 1 tablet (10 mg total) daily with breakfast for 27 days. 02/27/20 03/31/20  Prayan Ulin, Raquel Sarna C, PA-C  Teriparatide, Recombinant, (FORTEO) 620 MCG/2.48ML SOPN Inject 20 mcg into the skin daily before breakfast.     [provider]    Allergies    Ace inhibitors and Sulfur  Review of Systems   Review of Systems  Constitutional: Negative for chills and fever.  Respiratory: Positive for shortness of breath. Negative for cough.   Cardiovascular: Negative for chest pain and leg swelling.  Gastrointestinal: Negative for abdominal pain, diarrhea, nausea and vomiting.  Genitourinary: Negative for dysuria.  Musculoskeletal: Negative for back pain.  Neurological: Negative  for dizziness and syncope.  All other systems reviewed and are negative.   Physical Exam Updated Vital Signs BP 133/76   Pulse (!) 110   Temp 97.9 F (36.6 C) (Oral)   Resp (!) 44   SpO2 97%   Physical Exam Vitals  and nursing note reviewed.  Constitutional:      General: She is not in acute distress.    Appearance: She is well-developed. She is not diaphoretic.  HENT:     Head: Normocephalic and atraumatic.     Mouth/Throat:     Mouth: Mucous membranes are moist.     Pharynx: Oropharynx is clear.  Eyes:     Conjunctiva/sclera: Conjunctivae normal.  Cardiovascular:     Rate and Rhythm: Regular rhythm. Tachycardia present.     Pulses: Normal pulses.          Radial pulses are 2+ on the right side and 2+ on the left side.       Posterior tibial pulses are 2+ on the right side and 2+ on the left side.     Heart sounds: Normal heart sounds.     Comments: Tactile temperature in the extremities appropriate and equal bilaterally. Although the patient does have bilateral lower extremity pitting edema, she states this is nonacute and has not worsened. Pulmonary:     Effort: Tachypnea and respiratory distress present.     Breath sounds: Decreased breath sounds and wheezing present.  Abdominal:     Palpations: Abdomen is soft.     Tenderness: There is no abdominal tenderness. There is no guarding.  Musculoskeletal:     Cervical back: Neck supple.     Right lower leg: Edema present.     Left lower leg: Edema present.  Skin:    General: Skin is warm and dry.  Neurological:     Mental Status: She is alert.  Psychiatric:        Mood and Affect: Mood and affect normal.        Speech: Speech normal.        Behavior: Behavior normal.     ED Results / Procedures / Treatments   Labs (all labs ordered are listed, but only abnormal results are displayed) Labs Reviewed  CBC WITH DIFFERENTIAL/PLATELET - Abnormal; Notable for the following components:      Result Value   Abs  Immature Granulocytes 0.11 (*)    All other components within normal limits  COMPREHENSIVE METABOLIC PANEL - Abnormal; Notable for the following components:   Chloride 97 (*)    Glucose, Bld 139 (*)    Total Protein 5.7 (*)    Albumin 3.3 (*)    All other components within normal limits  I-STAT VENOUS BLOOD GAS, ED - Abnormal; Notable for the following components:   pO2, Ven 179.0 (*)    Bicarbonate 33.8 (*)    TCO2 36 (*)    Acid-Base Excess 7.0 (*)    All other components within normal limits  RESP PANEL BY RT-PCR (FLU A&B, COVID) ARPGX2  BRAIN NATRIURETIC PEPTIDE  BLOOD GAS, VENOUS  TROPONIN I (HIGH SENSITIVITY)  TROPONIN I (HIGH SENSITIVITY)    EKG EKG Interpretation  Date/Time:  Monday February 27 2020 08:39:42 EST Ventricular Rate:  103 PR Interval:    QRS Duration: 82 QT Interval:  364 QTC Calculation: 477 R Axis:   77 Text Interpretation: Sinus tachycardia Ventricular premature complex No significant change since last tracing Confirmed by Deno Etienne [phone]) on 02/27/2020 10:01:34 AM   Radiology DG Chest Port 1 View  Result Date: 02/27/2020 CLINICAL DATA:  Shortness of breath. EXAM: PORTABLE CHEST 1 VIEW COMPARISON:  February 13, 2020. FINDINGS: The heart size and mediastinal contours are within normal limits. Both lungs are clear. No pneumothorax or pleural effusion is noted.  Stable elevated right hemidiaphragm is noted. The visualized skeletal structures are unremarkable. IMPRESSION: No active disease. Aortic Atherosclerosis (ICD10-I70.0). Electronically Signed   By: Marijo Conception M.D.   On: 02/27/2020 09:50    Procedures Procedures (including critical care time)  Medications Ordered in ED Medications  albuterol (VENTOLIN HFA) 108 (90 Base) MCG/ACT inhaler 4 puff (4 puffs Inhalation Given 02/27/20 0817)  ipratropium (ATROVENT HFA) inhaler 2 puff (2 puffs Inhalation Given 02/27/20 0817)  methylPREDNISolone sodium succinate (SOLU-MEDROL) 125 mg/2 mL injection  62 mg (62 mg Intravenous Given 02/27/20 0816)  ipratropium-albuterol (DUONEB) 0.5-2.5 (3) MG/3ML nebulizer solution 3 mL (3 mLs Nebulization Given 02/27/20 1059)    ED Course  I have reviewed the triage vital signs and the nursing notes.  Pertinent labs & imaging results that were available during my care of the patient were reviewed by me and considered in my medical decision making (see chart for details).  Clinical Course as of Feb 27 1355  Mon Feb 27, 2020  1000 Patient voices improvement in her breathing.  Her tachypnea has improved.  Patient SPO2 97 to 98% on 4 L supplemental O2. Lungs sounds still diminished with some wheezing, but improved from previous. On room air, however, her SPO2 drops to 89%.   [SJ]  53 Spoke with Dr. Lamonte Sakai, patient's pulmonologist.  We discussed the patient's symptoms and findings here in the ED. He states regardless if she is admitted or discharged today, he for can initiate the process for consistently delivered home O2. He does agree that it seems as though patient maintains better when she is on prednisone.  He requests that when the patient is discharged we initiate a prednisone taper starting at 40 mg down to 10 mg.  She should then take 10 mg daily.  He will continue to manage this in the office.   [SJ]  1232 Spoke with Dr. Tobie Poet, hospitalist. Agrees to admit the patient.   [SJ]    Clinical Course User Index [SJ] Gibran Veselka C, PA-C   MDM Rules/Calculators/A&P                          Patient presents with worsening shortness of breath over the last few days. Nontoxic-appearing, afebrile, not hypotensive, but tachypneic, tachycardic, hypoxic.  I suspect her mild tachycardia is actually from her consistent albuterol use.  SPO2 97 to 98% on 4 L supplemental O2, however, 87% on room air at home.  She only has the consistent capability to administer intermittent oxygen at home currently using an [employer] system.  She does have diminished lung sounds  and wheezing on exam.  I personally reviewed and interpreted her labs and imaging studies.  Negative initial troponin.  Negative BNP.  Chest x-ray without acute abnormality. Patient did have some improvement with albuterol and Atrovent here in the ED, however, not enough for discharge.  We will admit her for further management.  Wrote prednisone taper prescription similar to pattern prescribed by Dr. Lamonte Sakai previously with the addition of chronic 10 mg daily prescribed at the end of the taper.   Findings and plan of care discussed with Deno Etienne, DO. Dr. Tyrone Nine personally evaluated and examined this patient.  Vitals:   02/27/20 1100 02/27/20 1115 02/27/20 1130 02/27/20 1145  BP: 104/72 108/65 (!) 117/59 118/73  Pulse: (!) 102 (!) 103 (!) 105 (!) 106  Resp: (!) 21 19 20 16   Temp:      TempSrc:  SpO2: 98% 95% 96% 98%     Final Clinical Impression(s) / ED Diagnoses Final diagnoses:  COPD exacerbation ([place])    Rx / DC Orders ED Discharge Orders         Ordered    predniSONE (DELTASONE) 10 MG tablet        02/27/20 1206           Lorayne Bender, PA-C 02/27/20 [place], DO 02/27/20 1412

## 2020-02-28 DIAGNOSIS — J9621 Acute and chronic respiratory failure with hypoxia: Secondary | ICD-10-CM | POA: Diagnosis not present

## 2020-02-28 DIAGNOSIS — L8990 Pressure ulcer of unspecified site, unspecified stage: Secondary | ICD-10-CM | POA: Insufficient documentation

## 2020-02-28 LAB — CBC
HCT: 37.3 % (ref 36.0–46.0)
Hemoglobin: 12.5 g/dL (ref 12.0–15.0)
MCH: 31.9 pg (ref 26.0–34.0)
MCHC: 33.5 g/dL (ref 30.0–36.0)
MCV: 95.2 fL (ref 80.0–100.0)
Platelets: 313 10*3/uL (ref 150–400)
RBC: 3.92 MIL/uL (ref 3.87–5.11)
RDW: 15.1 % (ref 11.5–15.5)
WBC: 9.2 10*3/uL (ref 4.0–10.5)
nRBC: 0 % (ref 0.0–0.2)

## 2020-02-28 LAB — BASIC METABOLIC PANEL
Anion gap: 13 (ref 5–15)
BUN: 22 mg/dL (ref 8–23)
CO2: 30 mmol/L (ref 22–32)
Calcium: 9.2 mg/dL (ref 8.9–10.3)
Chloride: 94 mmol/L — ABNORMAL LOW (ref 98–111)
Creatinine, Ser: 0.69 mg/dL (ref 0.44–1.00)
GFR, Estimated: >60 mL/min (ref >60)
Glucose, Bld: 222 mg/dL — ABNORMAL HIGH (ref 70–99)
Potassium: 4 mmol/L (ref 3.5–5.1)
Sodium: 137 mmol/L (ref 135–145)

## 2020-02-28 MED ORDER — NYSTATIN 100000 UNIT/ML MT SUSP: 5.0000 mL | Freq: Four times a day (QID) | OROMUCOSAL | 0 refills | Status: AC

## 2020-02-28 MED ORDER — PREDNISONE 10 MG PO TABS: 10.0000 mg | ORAL_TABLET | Freq: Every day | ORAL | 0 refills | Status: AC

## 2020-02-28 NOTE — Progress Notes (Signed)
CSW asked to call O2 supplier about change in O2 DME.  Spoke with pt and daughter in room, provider is [employer].  CSW spoke with Magda Paganini at [facility], she will send a tank to pt for transport home and will then talk with them about options moving forward. Lurline Idol, MSW, LCSW 11/30/20212:33 PM

## 2020-02-28 NOTE — Progress Notes (Signed)
Patient  Arrive to unit via stretcher from ED. No acute distress noted. Patient was transferred from stretcher to bed by staff. Stage 1 noted to bony prominence (coccyx and spine). Skin intact foam dressing added to both areas and noted in flowsheets.

## 2020-02-28 NOTE — Plan of Care (Signed)
  Problem: Education: Goal: Knowledge of disease or condition will improve Outcome: Completed/Met  Patient fully understands disease process

## 2020-02-28 NOTE — Discharge Summary (Signed)
Physician Discharge Summary  Heather [name]:[id number] DOB: 1926-04-24 DOA: 02/27/2020  PCP: Burnard Bunting, MD  Admit date: 02/27/2020 Discharge date: 02/28/2020  Admitted From: Home Discharge disposition: Home   Code Status: DNR  Diet Recommendation: Cardiac diet  Discharge Diagnosis:   Principal Problem:   Acute on chronic respiratory failure with hypoxia ([place]) Active Problems:   PAF (paroxysmal atrial fibrillation) ([place])   Long term current use of anticoagulant therapy   HTN (hypertension)   Dyspnea on exertion   COPD (chronic obstructive pulmonary disease) (H[facility])   Hypothyroidism   COPD exacerbation ([facility])   Pressure injury of skin  History of Present Illness / Brief narrative:  Heather [name] is a 86 y.o. female with PMH significant for HTN, paroxysmal A. fib, COPD, peripheral neuropathy, hiatal hernia, GERD, hypothyroidism, diplopia, chronic bilateral lower extreme lymphedema, COVID-19 infection in October 2020.  On 02/27/2020, patient presented to the ED with complaint of worsening shortness of breath. 1 week prior to presentation, she completed a course of prednisone taper.  In next 2 to 3 days, patient started having shortness of breath again was progressively worsened compromising her activities of daily living.  EMS noted a low O2 sat of 87%.  In the ED, patient was afebrile, tachycardic to less than 110, tachypneic to 30s, oxygen saturation improved to more than 90% on 2 L O2 Labs unremarkable. Blood gas with PCO2 only mildly elevated to 56, pH normal at 7.38 Respiratory virus panel including influenza and Covid PCR negative Chest x-ray did not show any acute infiltrate or edema or effusion. Symptoms improved in the ED with IV Solu-Medrol, albuterol, Atrovent. Patient was admitted for COPD exacerbation.  Subjective:  Seen and examined this morning.  Pleasant elderly Caucasian female.  Propped up in bed.  On low-flow oxygen by nasal cannula.  Daughter  at bedside.  Patient and her daughter prefer to go home today.  Hospital Course:  Acute on chronic respiratory failure with hypoxia COPD exacerbation -Presented with worsening shortness of breath  -chest x-ray without infiltrates or edema or effusion -Treated with Solu-Medrol 60 mg IV twice daily, bronchodilators. -Per ED provider, he spoke with Dr. Lamonte Sakai and Dr. Venia Minks prednisone titrated down with goals of chronic prednisone 10 mg daily on outpatient. -Discharged on oral tapering course of prednisone followed by prednisone 10 mg daily for long-term. -Continue supplemental oxygen.   Oral thrush -POA  -Continue nystatin swish and spit/swallow4 times daily  Paroxysmal A. Fib -Continue Cardizem  and Eliquis  Essential hypertension -Continue Cardizem, Lasix, losartan. -Continue to monitor blood pressure.  right middle lobe pulmonary nodule -Pulmonologist is following  Chronic constipation -MiraLAX daily.  Stable for discharge to home today.  Wound care: Pressure Injury 02/28/20 Back Other (Comment) Stage 1 -  Intact skin with non-blanchable redness of a localized area usually over a bony prominence. spine and coccyx (Active)  Date First Assessed/Time First Assessed: 02/28/20 0910   Location: Back  Location Orientation: Other (Comment)  Staging: Stage 1 -  Intact skin with non-blanchable redness of a localized area usually over a bony prominence.  Wound Description (Comment...    Assessments 02/28/2020  9:05 AM  Dressing Type Foam - Lift dressing to assess site every shift  Dressing Change Frequency PRN     No Linked orders to display    Discharge Exam:   Vitals:   02/28/20 0630 02/28/20 0700 02/28/20 0800 02/28/20 0905  BP: 110/60 100/63 117/67 126/63  Pulse: 86 86 87 91  Resp:  12 11 16  (!) 22  Temp:    98.6 F (37 C)  TempSrc:    Oral  SpO2: 99% 98% 98% 99%  Weight:    49.6 kg  Height:    5\' 3"  (1.6 m)    Body mass index is 19.37 kg/m.  General exam:  Pleasant, not in distress.   Skin: No rashes, lesions or ulcers. HEENT: Atraumatic, normocephalic, no obvious bleeding Lungs: Very diminished air entry in both lungs.  No crackles, no wheezing CVS: Regular rate and rhythm, no murmur GI/Abd soft, nontender, nondistended, bowel sound present CNS: Alert, awake, oriented x3 Psychiatry: Mood appropriate Extremities: No pedal edema, no calf tenderness  Follow ups:   Discharge Instructions    Diet - low sodium heart healthy   Complete by: As directed    Increase activity slowly   Complete by: As directed    Leave dressing on - Keep it clean, dry, and intact until clinic visit   Complete by: As directed       Follow-up Information    Burnard Bunting, MD Follow up.   Specialty: Internal Medicine Contact information: [place] [id number] [phone]        Collene Gobble, MD Follow up.   Specialty: Pulmonary Disease Contact information: [place] [address] [phone]               Recommendations for Outpatient Follow-Up:   1. Follow-up with PCP as an outpatient 2. Follow with pulmonology as an outpatient  Discharge Instructions:  Follow with Primary MD Burnard Bunting, MD in 7 days   Get CBC/BMP checked in next visit within 1 week by PCP or SNF MD ( we routinely change or add medications that can affect your baseline labs and fluid status, therefore we recommend that you get the mentioned basic workup next visit with your PCP, your PCP may decide not to get them or add new tests based on their clinical decision)  On your next visit with your PCP, please Get Medicines reviewed and adjusted.  Please request your PCP  to go over all Hospital Tests and Procedure/Radiological results at the follow up, please get all Hospital records sent to your Prim MD by signing hospital release before you go home.  Activity: As tolerated with Full fall precautions use walker/cane &  assistance as needed  For Heart failure patients - Check your Weight same time everyday, if you gain over 2 pounds, or you develop in leg swelling, experience more shortness of breath or chest pain, call your Primary MD immediately. Follow Cardiac Low Salt Diet and 1.5 lit/day fluid restriction.  If you have smoked or chewed Tobacco in the last 2 yrs please stop smoking, stop any regular Alcohol  and or any Recreational drug use.  If you experience worsening of your admission symptoms, develop shortness of breath, life threatening emergency, suicidal or homicidal thoughts you must seek medical attention immediately by calling 911 or calling your MD immediately  if symptoms less severe.  You Must read complete instructions/literature along with all the possible adverse reactions/side effects for all the Medicines you take and that have been prescribed to you. Take any new Medicines after you have completely understood and accpet all the possible adverse reactions/side effects.   Do not drive, operate heavy machinery, perform activities at heights, swimming or participation in water activities or provide baby sitting services if your were admitted for syncope or siezures until you have  seen by Primary MD or a Neurologist and advised to do so again.  Do not drive when taking Pain medications.  Do not take more than prescribed Pain, Sleep and Anxiety Medications  Wear Seat belts while driving.   Please note You were cared for by a hospitalist during your hospital stay. If you have any questions about your discharge medications or the care you received while you were in the hospital after you are discharged, you can call the unit and asked to speak with the hospitalist on call if the hospitalist that took care of you is not available. Once you are discharged, your primary care physician will handle any further medical issues. Please note that NO REFILLS for any discharge medications will be authorized  once you are discharged, as it is imperative that you return to your primary care physician (or establish a relationship with a primary care physician if you do not have one) for your aftercare needs so that they can reassess your need for medications and monitor your lab values.    Allergies as of 02/28/2020      Reactions   Ace Inhibitors Cough   Sulfur Nausea And Vomiting      Medication List    STOP taking these medications   acetaminophen 325 MG tablet Commonly known as: TYLENOL     TAKE these medications   albuterol (2.5 MG/3ML) 0.083% nebulizer solution Commonly known as: PROVENTIL Take 2.5 mg by nebulization every 6 (six) hours as needed for wheezing or shortness of breath.   albuterol 108 (90 Base) MCG/ACT inhaler Commonly known as: VENTOLIN HFA Inhale 2 puffs into the lungs every 6 (six) hours as needed for wheezing or shortness of breath.   BIOGAIA PROBIOTIC PO Take 1 capsule by mouth daily.   Breztri Aerosphere 160-9-4.8 MCG/ACT Aero Generic drug: Budeson-Glycopyrrol-Formoterol Inhale 2 puffs into the lungs 2 (two) times daily.   Breztri Aerosphere 160-9-4.8 MCG/ACT Aero Generic drug: Budeson-Glycopyrrol-Formoterol Inhale 2 puffs into the lungs in the morning and at bedtime.   budesonide-formoterol 160-4.5 MCG/ACT inhaler Commonly known as: Symbicort Inhale 1 puff into the lungs 2 (two) times daily.   calcium citrate-vitamin D 315-200 MG-UNIT tablet Commonly known as: CITRACAL+D Take 1 tablet by mouth daily.   cholecalciferol 25 MCG (1000 UNIT) tablet Commonly known as: VITAMIN D3 Take 1,000 Units by mouth daily.   diltiazem 180 MG 24 hr capsule Commonly known as: CARDIZEM CD Take 180 mg by mouth daily.   Eliquis 2.5 MG Tabs tablet Generic drug: apixaban Take 1 tablet by mouth twice daily What changed: how much to take   feeding supplement Liqd Take 237 mLs by mouth 2 (two) times daily between meals.   fluticasone 50 MCG/ACT nasal  spray Commonly known as: FLONASE Place 2 sprays into both nostrils at bedtime.   Forteo 620 MCG/2.48ML Sopn Generic drug: Teriparatide (Recombinant) Inject 20 mcg into the skin daily before breakfast.   furosemide 40 MG tablet Commonly known as: LASIX Take 40 mg by mouth daily.   guaiFENesin-dextromethorphan 100-10 MG/5ML syrup Commonly known as: ROBITUSSIN DM Take 5 mLs by mouth every 6 (six) hours as needed for cough. What changed: how much to take   Klor-Con M20 20 MEQ tablet Generic drug: potassium chloride SA Take 20 mEq by mouth daily.   levothyroxine 137 MCG tablet Commonly known as: SYNTHROID Take 137 mcg by mouth daily before breakfast.   losartan 25 MG tablet Commonly known as: COZAAR Take 25 mg by mouth daily.  magnesium oxide 400 MG tablet Commonly known as: MAG-OX Take 400 mg by mouth daily.   Melatonin 10 MG Caps Take 10 mg by mouth at bedtime.   multivitamin with minerals tablet Take 1 tablet by mouth daily.   nystatin 100000 UNIT/ML suspension Commonly known as: MYCOSTATIN Take 5 mLs (500,000 Units total) by mouth 4 (four) times daily for 5 days.   polyethylene glycol 17 g packet Commonly known as: MIRALAX / GLYCOLAX Take 17 g by mouth daily.   predniSONE 10 MG tablet Commonly known as: DELTASONE Take 4 tablets (40 mg total) by mouth daily with breakfast for 2 days, THEN 3 tablets (30 mg total) daily with breakfast for 2 days, THEN 2 tablets (20 mg total) daily with breakfast for 2 days, THEN 1 tablet (10 mg total) daily with breakfast for 27 days. Start taking on: February 27, 2020 What changed: See the new instructions.   predniSONE 10 MG tablet Commonly known as: DELTASONE Take 1 tablet (10 mg total) by mouth daily with breakfast. Start taking on: March 04, 2020 What changed: You were already taking a medication with the same name, and this prescription was added. Make sure you understand how and when to take each.   THERATEARS  OP Place 2 drops into both eyes daily.            Discharge Care Instructions  (From admission, onward)         Start     Ordered   02/28/20 0000  Leave dressing on - Keep it clean, dry, and intact until clinic visit        02/28/20 1515          Time coordinating discharge: 35 minutes  The results of significant diagnostics from this hospitalization (including imaging, microbiology, ancillary and laboratory) are listed below for reference.    Procedures and Diagnostic Studies:   DG Chest Port 1 View  Result Date: 02/27/2020 CLINICAL DATA:  Shortness of breath. EXAM: PORTABLE CHEST 1 VIEW COMPARISON:  February 13, 2020. FINDINGS: The heart size and mediastinal contours are within normal limits. Both lungs are clear. No pneumothorax or pleural effusion is noted. Stable elevated right hemidiaphragm is noted. The visualized skeletal structures are unremarkable. IMPRESSION: No active disease. Aortic Atherosclerosis (ICD10-I70.0). Electronically Signed   By: Marijo Conception M.D.   On: 02/27/2020 09:50     Labs:   Basic Metabolic Panel: Recent Labs  Lab 02/27/20 0832 02/27/20 0832 02/27/20 0912 02/28/20 0406  NA 139  --  138 137  K 3.6   < > 3.6 4.0  CL 97*  --   --  94*  CO2 30  --   --  30  GLUCOSE 139*  --   --  222*  BUN 14  --   --  22  CREATININE 0.71  --   --  0.69  CALCIUM 9.1  --   --  9.2   < > = values in this interval not displayed.   GFR Estimated Creatinine Clearance: 35.9 mL/min (by C-G formula based on SCr of 0.69 mg/dL). Liver Function Tests: Recent Labs  Lab 02/27/20 0832  AST 24  ALT 20  ALKPHOS 69  BILITOT 0.9  PROT 5.7*  ALBUMIN 3.3*   No results for input(s): LIPASE, AMYLASE in the last 168 hours. No results for input(s): AMMONIA in the last 168 hours. Coagulation profile No results for input(s): INR, PROTIME in the last 168 hours.  CBC: Recent Labs  Lab 02/27/20 0832 02/27/20 0912 02/28/20 0406  WBC 10.0  --  9.2   NEUTROABS 7.4  --   --   HGB 13.3 13.3 12.5  HCT 40.1 39.0 37.3  MCV 96.2  --  95.2  PLT 297  --  313   Cardiac Enzymes: No results for input(s): CKTOTAL, CKMB, CKMBINDEX, TROPONINI in the last 168 hours. BNP: Invalid input(s): POCBNP CBG: No results for input(s): GLUCAP in the last 168 hours. D-Dimer No results for input(s): DDIMER in the last 72 hours. Hgb A1c No results for input(s): HGBA1C in the last 72 hours. Lipid Profile No results for input(s): CHOL, HDL, LDLCALC, TRIG, CHOLHDL, LDLDIRECT in the last 72 hours. Thyroid function studies No results for input(s): TSH, T4TOTAL, T3FREE, THYROIDAB in the last 72 hours.  Invalid input(s): FREET3 Anemia work up No results for input(s): VITAMINB12, FOLATE, FERRITIN, TIBC, IRON, RETICCTPCT in the last 72 hours. Microbiology Recent Results (from the past 240 hour(s))  Resp Panel by RT-PCR (Flu A&B, Covid) Nasopharyngeal Swab     Status: None   Collection Time: 02/27/20  8:02 AM   Specimen: Nasopharyngeal Swab; Nasopharyngeal(NP) swabs in vial transport medium  Result Value Ref Range Status   SARS Coronavirus 2 by RT PCR NEGATIVE NEGATIVE Final    Comment: (NOTE) SARS-CoV-2 target nucleic acids are NOT DETECTED.  The SARS-CoV-2 RNA is generally detectable in upper respiratory specimens during the acute phase of infection. The lowest concentration of SARS-CoV-2 viral copies this assay can detect is 138 copies/mL. A negative result does not preclude SARS-Cov-2 infection and should not be used as the sole basis for treatment or other patient management decisions. A negative result may occur with  improper specimen collection/handling, submission of specimen other than nasopharyngeal swab, presence of viral mutation(s) within the areas targeted by this assay, and inadequate number of viral copies(<138 copies/mL). A negative result must be combined with clinical observations, patient history, and epidemiological information. The  expected result is Negative.  Fact Sheet for Patients:  EntrepreneurPulse.com.au  Fact Sheet for Healthcare Providers:  IncredibleEmployment.be  This test is no t yet approved or cleared by the [place] FDA and  has been authorized for detection and/or diagnosis of SARS-CoV-2 by FDA under an Emergency Use Authorization (EUA). This EUA will remain  in effect (meaning this test can be used) for the duration of the COVID-19 declaration under Section 564(b)(1) of the Act, 21 U.S.C.section 360bbb-3(b)(1), unless the authorization is terminated  or revoked sooner.       Influenza A by PCR NEGATIVE NEGATIVE Final   Influenza B by PCR NEGATIVE NEGATIVE Final    Comment: (NOTE) The Xpert Xpress SARS-CoV-2/FLU/RSV plus assay is intended as an aid in the diagnosis of influenza from Nasopharyngeal swab specimens and should not be used as a sole basis for treatment. Nasal washings and aspirates are unacceptable for Xpert Xpress SARS-CoV-2/FLU/RSV testing.  Fact Sheet for Patients: EntrepreneurPulse.com.au  Fact Sheet for Healthcare Providers: IncredibleEmployment.be  This test is not yet approved or cleared by the [place] FDA and has been authorized for detection and/or diagnosis of SARS-CoV-2 by FDA under an Emergency Use Authorization (EUA). This EUA will remain in effect (meaning this test can be used) for the duration of the COVID-19 declaration under Section 564(b)(1) of the Act, 21 U.S.C. section 360bbb-3(b)(1), unless the authorization is terminated or revoked.  Performed at [facility], [place] [address], [place], [place] [address]      Signed: Marlowe Aschoff Jordie Skalsky  [facility] 02/28/2020, 3:15 PM

## 2020-02-28 NOTE — ED Notes (Signed)
Attempted report to floor. Will call back in 10 minutes per request.

## 2020-03-01 ENCOUNTER — Telehealth: Payer: Self-pay | Admitting: Emergency Medicine

## 2020-03-01 DIAGNOSIS — N189 Chronic kidney disease, unspecified: Secondary | ICD-10-CM | POA: Diagnosis not present

## 2020-03-01 DIAGNOSIS — J441 Chronic obstructive pulmonary disease with (acute) exacerbation: Secondary | ICD-10-CM | POA: Diagnosis not present

## 2020-03-01 DIAGNOSIS — I5032 Chronic diastolic (congestive) heart failure: Secondary | ICD-10-CM | POA: Diagnosis not present

## 2020-03-01 DIAGNOSIS — M5416 Radiculopathy, lumbar region: Secondary | ICD-10-CM | POA: Diagnosis not present

## 2020-03-01 DIAGNOSIS — I130 Hypertensive heart and chronic kidney disease with heart failure and stage 1 through stage 4 chronic kidney disease, or unspecified chronic kidney disease: Secondary | ICD-10-CM | POA: Diagnosis not present

## 2020-03-01 DIAGNOSIS — J9620 Acute and chronic respiratory failure, unspecified whether with hypoxia or hypercapnia: Secondary | ICD-10-CM | POA: Diagnosis not present

## 2020-03-01 DIAGNOSIS — M810 Age-related osteoporosis without current pathological fracture: Secondary | ICD-10-CM | POA: Diagnosis not present

## 2020-03-01 DIAGNOSIS — I480 Paroxysmal atrial fibrillation: Secondary | ICD-10-CM | POA: Diagnosis not present

## 2020-03-01 DIAGNOSIS — I8310 Varicose veins of unspecified lower extremity with inflammation: Secondary | ICD-10-CM | POA: Diagnosis not present

## 2020-03-01 DIAGNOSIS — J449 Chronic obstructive pulmonary disease, unspecified: Secondary | ICD-10-CM

## 2020-03-01 NOTE — Telephone Encounter (Signed)
Called and spoke with daughter [name] per DPR she states that they would like rx for rescue inhaler and an order for new full mask for nebulizer, DME is [facility]. Also states manufacture needs sprivia dosage. Daughter states that we can call her back and let her know if it is 1.25 or 2.5 and she can call and let them know.  Dr. Lamonte Sakai please advise  Pharm- [facility] in [place]

## 2020-03-02 DIAGNOSIS — I480 Paroxysmal atrial fibrillation: Secondary | ICD-10-CM | POA: Diagnosis not present

## 2020-03-02 DIAGNOSIS — M810 Age-related osteoporosis without current pathological fracture: Secondary | ICD-10-CM | POA: Diagnosis not present

## 2020-03-02 DIAGNOSIS — M5416 Radiculopathy, lumbar region: Secondary | ICD-10-CM | POA: Diagnosis not present

## 2020-03-02 DIAGNOSIS — J441 Chronic obstructive pulmonary disease with (acute) exacerbation: Secondary | ICD-10-CM | POA: Diagnosis not present

## 2020-03-02 DIAGNOSIS — I130 Hypertensive heart and chronic kidney disease with heart failure and stage 1 through stage 4 chronic kidney disease, or unspecified chronic kidney disease: Secondary | ICD-10-CM | POA: Diagnosis not present

## 2020-03-02 DIAGNOSIS — I8310 Varicose veins of unspecified lower extremity with inflammation: Secondary | ICD-10-CM | POA: Diagnosis not present

## 2020-03-02 DIAGNOSIS — N189 Chronic kidney disease, unspecified: Secondary | ICD-10-CM | POA: Diagnosis not present

## 2020-03-02 DIAGNOSIS — I5032 Chronic diastolic (congestive) heart failure: Secondary | ICD-10-CM | POA: Diagnosis not present

## 2020-03-02 DIAGNOSIS — J9620 Acute and chronic respiratory failure, unspecified whether with hypoxia or hypercapnia: Secondary | ICD-10-CM | POA: Diagnosis not present

## 2020-03-05 DIAGNOSIS — I130 Hypertensive heart and chronic kidney disease with heart failure and stage 1 through stage 4 chronic kidney disease, or unspecified chronic kidney disease: Secondary | ICD-10-CM | POA: Diagnosis not present

## 2020-03-05 DIAGNOSIS — I480 Paroxysmal atrial fibrillation: Secondary | ICD-10-CM | POA: Diagnosis not present

## 2020-03-05 DIAGNOSIS — I8310 Varicose veins of unspecified lower extremity with inflammation: Secondary | ICD-10-CM | POA: Diagnosis not present

## 2020-03-05 DIAGNOSIS — M5416 Radiculopathy, lumbar region: Secondary | ICD-10-CM | POA: Diagnosis not present

## 2020-03-05 DIAGNOSIS — J9620 Acute and chronic respiratory failure, unspecified whether with hypoxia or hypercapnia: Secondary | ICD-10-CM | POA: Diagnosis not present

## 2020-03-05 DIAGNOSIS — I5032 Chronic diastolic (congestive) heart failure: Secondary | ICD-10-CM | POA: Diagnosis not present

## 2020-03-05 DIAGNOSIS — M810 Age-related osteoporosis without current pathological fracture: Secondary | ICD-10-CM | POA: Diagnosis not present

## 2020-03-05 DIAGNOSIS — N189 Chronic kidney disease, unspecified: Secondary | ICD-10-CM | POA: Diagnosis not present

## 2020-03-05 DIAGNOSIS — J441 Chronic obstructive pulmonary disease with (acute) exacerbation: Secondary | ICD-10-CM | POA: Diagnosis not present

## 2020-03-05 MED ORDER — ALBUTEROL SULFATE HFA 108 (90 BASE) MCG/ACT IN AERS: 2.0000 | INHALATION_SPRAY | RESPIRATORY_TRACT | 5 refills | Status: DC | PRN

## 2020-03-05 NOTE — Telephone Encounter (Signed)
I have called and spoke with Heather [name] and she is aware of the neb mask that has been ordered and the rescue inhaler that has been sent in to the pharmacy.  Heather [name] is aware of the appt with SG on 12/15 at 3.

## 2020-03-05 NOTE — Telephone Encounter (Signed)
OK to order neb mask OK to order albuterol HFA 2 puffs q4h prn for SOB  The spiriva dose is 2.5  Also, pt needs an OV with RB or APP in the next 2 weeks. At that time will discuss chronic pred dosing.

## 2020-03-06 DIAGNOSIS — J449 Chronic obstructive pulmonary disease, unspecified: Secondary | ICD-10-CM | POA: Diagnosis not present

## 2020-03-07 ENCOUNTER — Ambulatory Visit: Payer: Medicare HMO | Admitting: Internal Medicine

## 2020-03-07 ENCOUNTER — Encounter: Payer: Self-pay | Admitting: Internal Medicine

## 2020-03-07 ENCOUNTER — Other Ambulatory Visit: Payer: Self-pay

## 2020-03-07 VITALS — BP 138/72 | HR 82 | Ht 63.0 in | Wt 104.6 lb

## 2020-03-07 DIAGNOSIS — J438 Other emphysema: Secondary | ICD-10-CM

## 2020-03-07 DIAGNOSIS — I4819 Other persistent atrial fibrillation: Secondary | ICD-10-CM

## 2020-03-07 DIAGNOSIS — I10 Essential (primary) hypertension: Secondary | ICD-10-CM | POA: Diagnosis not present

## 2020-03-07 DIAGNOSIS — Z7901 Long term (current) use of anticoagulants: Secondary | ICD-10-CM | POA: Diagnosis not present

## 2020-03-07 DIAGNOSIS — J449 Chronic obstructive pulmonary disease, unspecified: Secondary | ICD-10-CM | POA: Diagnosis not present

## 2020-03-07 NOTE — Patient Instructions (Signed)
Medication Instructions:  Your physician recommends that you continue on your current medications as directed. Please refer to the Current Medication list given to you today.  *If you need a refill on your cardiac medications before your next appointment, please call your pharmacy*  Follow-Up: At [facility], you and your health needs are our priority.  As part of our continuing mission to provide you with exceptional heart care, we have created designated Provider Care Teams.  These Care Teams include your primary Cardiologist (physician) and Advanced Practice Providers (APPs -  Physician Assistants and Nurse Practitioners) who all work together to provide you with the care you need, when you need it.  We recommend signing up for the patient portal called "MyChart".  Sign up information is provided on this After Visit Summary.  MyChart is used to connect with patients for Virtual Visits (Telemedicine).  Patients are able to view lab/test results, encounter notes, upcoming appointments, etc.  Non-urgent messages can be sent to your provider as well.   To learn more about what you can do with MyChart, go to [email or web address].    Your next appointment:   12 month(s)  The format for your next appointment:   In Person  Provider:   Dr. Lyman Bishop or APP on his care team: - Almyra Deforest, [place], [place] - Roby Lofts, [place]   Other Instructions

## 2020-03-07 NOTE — Progress Notes (Signed)
Heather [name]   11/17/1926  [id number]  Primary Physicia Heather Bunting, MD Primary Cardiologist: Dr Heather [name]  CC: Follow-up hospitalization  HPI:  Pleasant 86 y/o followed by Dr Heather [name] with a history of PAF. She recently had a MET test that indicated Emphysema although she has no history of smoking. She is on chronic Coumadin. Her B/P has been controlled by her readings at home and our readings here by our pharmacist. Echo in Feb 2013 showed an EF of >55%, NL LA size, and NL RVF. She had an episode of weakness and palpitations 11/13/12 pm. EMS was contacted and she was taken to [facility]. She received Diltiazem en route and was in NSR when she arrived at [facility]. They suggested she increase her Diltiazem to 240 mg daily but she was hesitant to do this.  Heather [name] returns today for followup. She is extubated doing remarkably well. Her shortness of breath has stabilized and she has not been admitted recently for worsening COPD or pulmonary problems. She is maintaining a sinus rhythm in the 80s without any evidence of recurrent A. fib. She is on warfarin with therapeutic INRs.  She does have some dependent rubor in both of her legs. I was able to palpate a decent dorsalis pedis pulse on the right foot but not on the left. She may have small vessel disease of the toes. It would be reasonable to consider arterial Dopplers.  [name]. She recently has been having problems with gallstones and underwent a cholecystectomy. Postoperatively she developed pain and has been on narcotics. She has not had a bowel movement in about 1-1/2 weeks. She reports significant pain and distention of the abdomen which is firm. She did hold her warfarin for the procedure and has been restarted. Her INR is subtherapeutic today and will require further adjustment.  08/14/2015  Heather [name] returns today for follow-up. She denies any worsening shortness of breath in fact she feels like her  breathing is at baseline. Most events related to COPD. Blood pressure is well-controlled today. Her INR slightly subtherapeutic at 1.7. Adjustments were made to warfarin. We did discuss alternatives to warfarin including the novel oral anticoagulants. She reported due to cost issues she feels more comfortable staying on warfarin. In general her INR seem to be fairly well-controlled although she does have some periods of subtherapeutic values.  09/19/2016  Heather [name] was seen today in follow-up. She reports she recently injured her left shin. She does not recall striking it on anything however appears to have had an avulsion injury which was difficult to control regarding the bleeding. Blood pressure is well-controlled today. Her weight is still on the low end of normal. She has significant dependent rubor of the lower extremities she says which improves completely when she elevates her feet and she has compression stockings at home but does not wear them regularly. She has subsequently switched from warfarin over to low-dose Eliquis for stroke prevention. She denies any chest pain or worsening shortness of breath. She is currently living with her [place] in [place]. The only other medical thing that she's had a recent compression fracture of the lumbar spine which is quite painful but ultimately has healed.  03/07/2020  Heather [name] is seen today in follow-up.  Been several years since I saw her.  She continues to struggle with COPD and was recently hospitalized with an exacerbation.  During that hospitalization however her A. fib was fairly well rate  controlled.  She had a repeat echo which showed normal LVEF of 65 to 70% without any significant valvular stenosis and no changes compared to her prior echo.  Overall this seems to be fairly stable.  She remains on Eliquis low-dose which is well-tolerated.  It does appear that she is lost some more weight.  She is now 104 pounds with a BMI just  over 18.  Lipids were very low when checked by her PCP in January of this year 7 total cholesterol 143, HDL 78, LDL 53 and triglycerides 60.  Current Outpatient Medications  Medication Sig Dispense Refill  . albuterol (PROVENTIL) (2.5 MG/3ML) 0.083% nebulizer solution Take 2.5 mg by nebulization every 6 (six) hours as needed for wheezing or shortness of breath.    Marland Kitchen albuterol (VENTOLIN HFA) 108 (90 Base) MCG/ACT inhaler Inhale 2 puffs into the lungs every 4 (four) hours as needed for wheezing or shortness of breath. 18 g 5  . Budeson-Glycopyrrol-Formoterol (BREZTRI AEROSPHERE) 160-9-4.8 MCG/ACT AERO Inhale 2 puffs into the lungs in the morning and at bedtime. 5.9 g 0  . budesonide-formoterol (SYMBICORT) 160-4.5 MCG/ACT inhaler Inhale 1 puff into the lungs 2 (two) times daily. 1 each 6  . calcium citrate-vitamin D (CITRACAL+D) 315-200 MG-UNIT per tablet Take 1 tablet by mouth daily.    . Carboxymethylcellulose Sodium (THERATEARS OP) Place 2 drops into both eyes daily.     . cholecalciferol (VITAMIN D3) 25 MCG (1000 UNIT) tablet Take 1,000 Units by mouth daily.    Marland Kitchen diltiazem (CARDIZEM CD) 180 MG 24 hr capsule Take 180 mg by mouth daily.     Marland Kitchen ELIQUIS 2.5 MG TABS tablet Take 1 tablet by mouth twice daily (Patient taking differently: Take 2.5 mg by mouth 2 (two) times daily. ) 180 tablet 0  . feeding supplement, ENSURE ENLIVE, (ENSURE ENLIVE) LIQD Take 237 mLs by mouth 2 (two) times daily between meals. 237 mL 12  . fluticasone (FLONASE) 50 MCG/ACT nasal spray Place 2 sprays into both nostrils at bedtime.     . furosemide (LASIX) 40 MG tablet Take 40 mg by mouth daily.     Marland Kitchen guaiFENesin-dextromethorphan (ROBITUSSIN DM) 100-10 MG/5ML syrup Take 5 mLs by mouth every 6 (six) hours as needed for cough. (Patient taking differently: Take 10 mLs by mouth every 6 (six) hours as needed for cough. ) 118 mL 0  . KLOR-CON M20 20 MEQ tablet Take 20 mEq by mouth daily.     . Lactobacillus Reuteri (BIOGAIA PROBIOTIC  PO) Take 1 capsule by mouth daily.    Marland Kitchen levothyroxine (SYNTHROID, LEVOTHROID) 137 MCG tablet Take 137 mcg by mouth daily before breakfast.     . losartan (COZAAR) 25 MG tablet Take 25 mg by mouth daily.     . magnesium oxide (MAG-OX) 400 MG tablet Take 400 mg by mouth daily.    . Melatonin 10 MG CAPS Take 10 mg by mouth at bedtime.    . Multiple Vitamins-Minerals (MULTIVITAMIN WITH MINERALS) tablet Take 1 tablet by mouth daily.    . polyethylene glycol (MIRALAX / GLYCOLAX) packet Take 17 g by mouth daily.     . predniSONE (DELTASONE) 10 MG tablet Take 4 tablets (40 mg total) by mouth daily with breakfast for 2 days, THEN 3 tablets (30 mg total) daily with breakfast for 2 days, THEN 2 tablets (20 mg total) daily with breakfast for 2 days, THEN 1 tablet (10 mg total) daily with breakfast for 27 days. 45 tablet 0  . predniSONE (  DELTASONE) 10 MG tablet Take 1 tablet (10 mg total) by mouth daily with breakfast. 30 tablet 0  . Teriparatide, Recombinant, (FORTEO) 620 MCG/2.48ML SOPN Inject 20 mcg into the skin daily before breakfast.      No current facility-administered medications for this visit.    Allergies  Allergen Reactions  . Ace Inhibitors Cough  . Sulfur Nausea And Vomiting    Social History   Socioeconomic History  . Marital status: Married    Spouse name: Not on file  . Number of children: 2  . Years of education: Not on file  . Highest education level: Not on file  Occupational History  . Occupation: Retired Astronomer  Tobacco Use  . Smoking status: Former Smoker    Packs/day: 1.00    Years: 20.00    Pack years: 20.00    Types: Cigarettes    Start date: 75    Quit date: 09/12/1978    Years since quitting: 41.5  . Smokeless tobacco: Never Used  Vaping Use  . Vaping Use: Never used  Substance and Sexual Activity  . Alcohol use: Not Currently    Alcohol/week: 0.0 standard drinks    Comment: occ  . Drug use: No  . Sexual activity: Yes  Other Topics Concern  . Not  on file  Social History Narrative   Right handed   One story home   Drinks coffee   Social Determinants of Health   Financial Resource Strain:   . Difficulty of Paying Living Expenses: Not on file  Food Insecurity:   . Worried About Charity fundraiser in the Last Year: Not on file  . Ran Out of Food in the Last Year: Not on file  Transportation Needs:   . Lack of Transportation (Medical): Not on file  . Lack of Transportation (Non-Medical): Not on file  Physical Activity:   . Days of Exercise per Week: Not on file  . Minutes of Exercise per Session: Not on file  Stress:   . Feeling of Stress : Not on file  Social Connections:   . Frequency of Communication with Friends and Family: Not on file  . Frequency of Social Gatherings with Friends and Family: Not on file  . Attends Religious Services: Not on file  . Active Member of Clubs or Organizations: Not on file  . Attends Archivist Meetings: Not on file  . Marital Status: Not on file  Intimate Partner Violence:   . Fear of Current or Ex-Partner: Not on file  . Emotionally Abused: Not on file  . Physically Abused: Not on file  . Sexually Abused: Not on file     Review of Systems: Pertinent items noted in HPI and remainder of comprehensive ROS otherwise negative.  Blood pressure 138/72, pulse 82, height _0  (1.6 m), weight 104 lb 9.6 oz (47.4 kg), SpO2 97 %.  General appearance: alert, no distress and thin Neck: no carotid bruit and no JVD Lungs: diminished breath sounds bilaterally and wheezes bilaterally Heart: irregularly irregular rhythm Abdomen: soft, non-tender; bowel sounds normal; no masses,  no organomegaly and scaphoid Extremities: extremities normal, atraumatic, no cyanosis or edema Pulses: 2+ and symmetric Skin: Dependent rubor of both feet Neurologic: Grossly normal Psych: Pleasant  EKG  Deferred  ASSESSMENT AND PLAN:   Patient Active Problem List   Diagnosis Date Noted  . Pressure  injury of skin 02/28/2020  . COPD exacerbation ([place]) 02/27/2020  . Chronic respiratory failure with hypoxia ([place]) 02/22/2020  .  Acute on chronic respiratory failure with hypoxia ([place]) 10/18/2019  . Pulmonary nodule 03/29/2019  . Pneumonia due to COVID-19 virus 01/06/2019  . Hypotension 01/05/2019  . Protein-calorie malnutrition, severe 04/26/2018  . Skin avulsion 09/19/2016  . Influenza B 04/16/2016  . Sepsis ([place]) 04/16/2016  . Hypokalemia 04/16/2016  . Hypothyroidism 04/16/2016  . Post herpetic neuralgia 06/06/2014  . Shingles outbreak 04/19/2014  . Gallstones 09/20/2013  . COPD (chronic obstructive pulmonary disease) ([place]) 05/17/2013  . Dyspnea on exertion 04/25/2013  . Pulmonary emphysema ([place]) 11/15/2012  . HTN (hypertension) 11/15/2012  . PAF (paroxysmal atrial fibrillation) ([place]) 06/14/2012  . Long term current use of anticoagulant therapy 06/14/2012  . Ventral hernia 07/31/2011  . Abdominal wall mass 07/01/2011   PLAN: 1.  Ms. Bow had recent hospitalization for acute COPD exacerbation.  Her A. fib appears to be controlled with adequate rates.  She is anticoagulated on Eliquis.  No bleeding issues associated with that.  Blood pressures well controlled and her cholesterol is at goal.  Her main issues seem to be pulmonary at this point.  She had a repeat echo during hospitalization which showed normal LVEF without any significant valvular disease.  I would continue her current medications.  We will try to follow-up with her in a year or sooner as necessary.  Pixie Casino, MD, [facility], [place] Director of the Advanced Lipid Disorders &  Cardiovascular Risk Reduction Clinic Diplomate of the American Board of Clinical Lipidology Attending Cardiologist  Direct Dial: [phone]  Fax: [phone]  Website:  [email or web address] 03/07/2020 9:46 AM

## 2020-03-12 DIAGNOSIS — M5416 Radiculopathy, lumbar region: Secondary | ICD-10-CM | POA: Diagnosis not present

## 2020-03-12 DIAGNOSIS — J441 Chronic obstructive pulmonary disease with (acute) exacerbation: Secondary | ICD-10-CM | POA: Diagnosis not present

## 2020-03-12 DIAGNOSIS — I8310 Varicose veins of unspecified lower extremity with inflammation: Secondary | ICD-10-CM | POA: Diagnosis not present

## 2020-03-12 DIAGNOSIS — M810 Age-related osteoporosis without current pathological fracture: Secondary | ICD-10-CM | POA: Diagnosis not present

## 2020-03-12 DIAGNOSIS — I480 Paroxysmal atrial fibrillation: Secondary | ICD-10-CM | POA: Diagnosis not present

## 2020-03-12 DIAGNOSIS — I5032 Chronic diastolic (congestive) heart failure: Secondary | ICD-10-CM | POA: Diagnosis not present

## 2020-03-12 DIAGNOSIS — I130 Hypertensive heart and chronic kidney disease with heart failure and stage 1 through stage 4 chronic kidney disease, or unspecified chronic kidney disease: Secondary | ICD-10-CM | POA: Diagnosis not present

## 2020-03-12 DIAGNOSIS — J9620 Acute and chronic respiratory failure, unspecified whether with hypoxia or hypercapnia: Secondary | ICD-10-CM | POA: Diagnosis not present

## 2020-03-12 DIAGNOSIS — N189 Chronic kidney disease, unspecified: Secondary | ICD-10-CM | POA: Diagnosis not present

## 2020-03-13 DIAGNOSIS — I5032 Chronic diastolic (congestive) heart failure: Secondary | ICD-10-CM | POA: Diagnosis not present

## 2020-03-13 DIAGNOSIS — I130 Hypertensive heart and chronic kidney disease with heart failure and stage 1 through stage 4 chronic kidney disease, or unspecified chronic kidney disease: Secondary | ICD-10-CM | POA: Diagnosis not present

## 2020-03-13 DIAGNOSIS — I8310 Varicose veins of unspecified lower extremity with inflammation: Secondary | ICD-10-CM | POA: Diagnosis not present

## 2020-03-13 DIAGNOSIS — M810 Age-related osteoporosis without current pathological fracture: Secondary | ICD-10-CM | POA: Diagnosis not present

## 2020-03-13 DIAGNOSIS — J441 Chronic obstructive pulmonary disease with (acute) exacerbation: Secondary | ICD-10-CM | POA: Diagnosis not present

## 2020-03-13 DIAGNOSIS — I480 Paroxysmal atrial fibrillation: Secondary | ICD-10-CM | POA: Diagnosis not present

## 2020-03-13 DIAGNOSIS — M5416 Radiculopathy, lumbar region: Secondary | ICD-10-CM | POA: Diagnosis not present

## 2020-03-13 DIAGNOSIS — N189 Chronic kidney disease, unspecified: Secondary | ICD-10-CM | POA: Diagnosis not present

## 2020-03-13 DIAGNOSIS — U071 COVID-19: Secondary | ICD-10-CM | POA: Diagnosis not present

## 2020-03-13 DIAGNOSIS — J449 Chronic obstructive pulmonary disease, unspecified: Secondary | ICD-10-CM | POA: Diagnosis not present

## 2020-03-13 DIAGNOSIS — J9620 Acute and chronic respiratory failure, unspecified whether with hypoxia or hypercapnia: Secondary | ICD-10-CM | POA: Diagnosis not present

## 2020-03-14 ENCOUNTER — Other Ambulatory Visit: Payer: Self-pay | Admitting: Acute Care

## 2020-03-14 ENCOUNTER — Ambulatory Visit: Payer: Medicare HMO | Admitting: Acute Care

## 2020-03-14 ENCOUNTER — Other Ambulatory Visit: Payer: Self-pay

## 2020-03-14 ENCOUNTER — Encounter: Payer: Self-pay | Admitting: Acute Care

## 2020-03-14 VITALS — BP 112/70 | HR 63 | Temp 97.0°F | Ht 63.0 in | Wt 103.0 lb

## 2020-03-14 DIAGNOSIS — R911 Solitary pulmonary nodule: Secondary | ICD-10-CM

## 2020-03-14 DIAGNOSIS — I5030 Unspecified diastolic (congestive) heart failure: Secondary | ICD-10-CM

## 2020-03-14 DIAGNOSIS — J441 Chronic obstructive pulmonary disease with (acute) exacerbation: Secondary | ICD-10-CM

## 2020-03-14 DIAGNOSIS — I38 Endocarditis, valve unspecified: Secondary | ICD-10-CM

## 2020-03-14 MED ORDER — PREDNISONE 10 MG PO TABS: 10.0000 mg | ORAL_TABLET | Freq: Every day | ORAL | 0 refills | Status: DC

## 2020-03-14 NOTE — Progress Notes (Signed)
History of Present Illness Heather [name] is a 86 y.o. female former smoker ( Quit 1980 with a 20 pack year smoking history) with PMH significant for HTN, paroxysmal A. fib, COPD, chronic prednisone use 10 mg daily, peripheral neuropathy, hiatal hernia, GERD, hypothyroidism, diplopia, chronic bilateral lower extreme lymphedema,COVID-19 infection in October 2020.She also has a Pulmonary Nodule that we are following annually with imaging for growth.She is followed by Dr. Lamonte Sakai.   Hospital Follow up Admit date: 02/27/2020 Discharge date: 02/28/2020  Principal Problem:   Acute on chronic respiratory failure with hypoxia (HCC) Active Problems:   PAF (paroxysmal atrial fibrillation) ([place])   Long term current use of anticoagulant therapy   HTN (hypertension)   Dyspnea on exertion   COPD (chronic obstructive pulmonary disease) (HCC)   Hypothyroidism   COPD exacerbation (HCC)   Pressure injury of skin  Synopsis of Hospitalization Heather Hendriks Adkinsis a 86 y.o.femalewith PMH significant for HTN, paroxysmal A. fib, COPD, peripheral neuropathy, hiatal hernia, GERD, hypothyroidism, diplopia, chronic bilateral lower extreme lymphedema,COVID-19 infection in October 2020.  On11/29/2021, patientpresented to the ED with complaint of worsening shortness of breath. 1 week prior to presentation, she completed a course of prednisone taper. In next 2 to 3 days, patient started having shortness of breath again was progressively worsened compromising her activities of daily living.  EMS noted a low O2 sat of 87%.  In the ED, patient was afebrile, tachycardic to less than 110, tachypneic to 30s, oxygen saturation improved to more than 90% on 2 L O2 Labs unremarkable. Blood gas with PCO2 only mildly elevated to 56, pH normal at 7.38 Respiratory virus panel including influenza and Covid PCR negative Chest x-ray did not show any acute infiltrate or edema or effusion. Symptoms improved in the ED with IV  Solu-Medrol, 60 mg BID, albuterol, Atrovent. Patient was admitted for COPD exacerbation ED provider spoke with Dr. Lamonte Sakai who wants prednisone titrated down with goals of chronic prednisone 10 mg daily on outpatient. She was discharged on oral tapering course of prednisone followed by prednisone 10 mg daily for long-term.  03/15/2020  Pt. Presents for hospital follow up for COPD Flare. She states she has been doing well. She has been tapered down to 10 mg of prednisone daily , and she has been doing well on this dose. She has no cough. She has minimal secretions and they are clear. She is afebrile. She is wearing her oxygen as she should. She is wearing 2 L with rest, and 3-4 L with exertion. She does have some ankle swelling. She has an appointment with her PCP for labs to assess her renal function to determine ability to increase Lasix. She is following the 1.5 Liter per day fluid restriction. She does a breathing treatment each morning and rescue as needed. She uses her rescue 1-2 times a day. She states she is having the most stable interval she has had in a long time. She has tremendous family support. She is due for her annual CT Chest as surveillance of a RML pulmonary nodule 09/2020.   Test Results: 10/15/2019 Super D CT Chest Continual gradual increase in size of right middle lobe lung nodule which remains worrisome for pulmonary malignancy. Emphysema and aortic atherosclerosis. Three vessel coronary artery calcifications noted.  CBC Latest Ref Rng & Units 02/28/2020 02/27/2020 02/27/2020  WBC 4.0 - 10.5 K/uL 9.2 - 10.0  Hemoglobin 12.0 - 15.0 g/dL 12.5 13.3 13.3  Hematocrit 36.0 - 46.0 % 37.3 39.0 40.1  Platelets  150 - 400 K/uL 313 - 297    BMP Latest Ref Rng & Units 02/28/2020 02/27/2020 02/27/2020  Glucose 70 - 99 mg/dL 222(H) - 139(H)  BUN 8 - 23 mg/dL 22 - 14  Creatinine 0.44 - 1.00 mg/dL 0.69 - 0.71  BUN/Creat Ratio 12 - 28 - - -  Sodium 135 - 145 mmol/L 137 138 139   Potassium 3.5 - 5.1 mmol/L 4.0 3.6 3.6  Chloride 98 - 111 mmol/L 94(L) - 97(L)  CO2 22 - 32 mmol/L 30 - 30  Calcium 8.9 - 10.3 mg/dL 9.2 - 9.1    BNP    Component Value Date/Time   BNP 67.7 02/27/2020 0832    ProBNP    Component Value Date/Time   PROBNP 121.6 02/20/2014 1435    PFT    Component Value Date/Time   FEV1PRE 1.00 05/31/2013 1300   FEV1POST 1.08 05/31/2013 1300   FVCPRE 2.10 05/31/2013 1300   FVCPOST 2.15 05/31/2013 1300   DLCOUNC 15.19 05/31/2013 1300   PREFEV1FVCRT 48 05/31/2013 1300   PSTFEV1FVCRT 50 05/31/2013 1300    DG Chest Port 1 View  Result Date: 02/27/2020 CLINICAL DATA:  Shortness of breath. EXAM: PORTABLE CHEST 1 VIEW COMPARISON:  February 13, 2020. FINDINGS: The heart size and mediastinal contours are within normal limits. Both lungs are clear. No pneumothorax or pleural effusion is noted. Stable elevated right hemidiaphragm is noted. The visualized skeletal structures are unremarkable. IMPRESSION: No active disease. Aortic Atherosclerosis (ICD10-I70.0). Electronically Signed   By: Marijo Conception M.D.   On: 02/27/2020 09:50   ECHOCARDIOGRAM COMPLETE  Result Date: 02/17/2020    ECHOCARDIOGRAM REPORT   Patient Name:   Heather [name] Date of Exam: 02/16/2020 Medical Rec #:  [id number]       Height:       63.0 in Accession #:    [id number]      Weight:       101.4 lb Date of Birth:  29-May-1926      BSA:          1.449 m Patient Age:    52 years        BP:           112/72 mmHg Patient Gender: F               HR:           98 bpm. Exam Location:  Outpatient Procedure: 2D Echo Indications:    dyspnea  History:        Patient has prior history of Echocardiogram examinations, most                 recent 01/21/2017. COPD, Arrythmias:Atrial Fibrillation; Risk                 Factors:Hypertension and Former Smoker.  Sonographer:    Jannett Celestine RDCS (AE) Referring Phys: [place]  Sonographer Comments: very low, medial parasternal window. IMPRESSIONS   1. Left ventricular ejection fraction, by estimation, is 65 to 70%. The left ventricle has hyperdynamic function. The left ventricle has no regional wall motion abnormalities. There is mild left ventricular hypertrophy. Left ventricular diastolic function could not be evaluated.  2. Right ventricular systolic function is normal. The right ventricular size is normal. There is mildly elevated pulmonary artery systolic pressure.  3. The mitral valve is grossly normal. Trivial mitral valve regurgitation. Moderate mitral annular calcification.  4. The aortic valve is tricuspid. There is moderate calcification  of the aortic valve. There is moderate thickening of the aortic valve. Aortic valve regurgitation is trivial. Mild to moderate aortic valve sclerosis/calcification is present, without any  evidence of aortic stenosis. Comparison(s): No significant change from prior study. Conclusion(s)/Recommendation(s): Hyperdynamic LV function, with sinus tachycardia throughout study. FINDINGS  Left Ventricle: Left ventricular ejection fraction, by estimation, is 65 to 70%. The left ventricle has hyperdynamic function. The left ventricle has no regional wall motion abnormalities. The left ventricular internal cavity size was small. There is mild left ventricular hypertrophy. Left ventricular diastolic function could not be evaluated. Right Ventricle: The right ventricular size is normal. Right vetricular wall thickness was not well visualized. Right ventricular systolic function is normal. There is mildly elevated pulmonary artery systolic pressure. The tricuspid regurgitant velocity  is 2.95 m/s, and with an assumed right atrial pressure of 8 mmHg, the estimated right ventricular systolic pressure is 81.2 mmHg. Left Atrium: Left atrial size was not well visualized. Right Atrium: Right atrial size was not well visualized. Pericardium: There is no evidence of pericardial effusion. Mitral Valve: The mitral valve is grossly normal.  There is mild thickening of the mitral valve leaflet(s). There is moderate calcification of the mitral valve leaflet(s). Moderate mitral annular calcification. Trivial mitral valve regurgitation. Tricuspid Valve: The tricuspid valve is normal in structure. Tricuspid valve regurgitation is mild . No evidence of tricuspid stenosis. Aortic Valve: The aortic valve is tricuspid. There is moderate calcification of the aortic valve. There is moderate thickening of the aortic valve. Aortic valve regurgitation is trivial. Mild to moderate aortic valve sclerosis/calcification is present, without any evidence of aortic stenosis. Pulmonic Valve: The pulmonic valve was not well visualized. Pulmonic valve regurgitation is not visualized. Aorta: The ascending aorta was not well visualized, the aortic arch was not well visualized and the aortic root is normal in size and structure. Venous: The inferior vena cava was not well visualized. IAS/Shunts: The interatrial septum was not well visualized.  LEFT VENTRICLE PLAX 2D LVIDd:         3.20 cm LVIDs:         2.10 cm LV PW:         1.10 cm LV IVS:        1.30 cm LVOT diam:     2.00 cm LV SV:         78 LV SV Index:   54 LVOT Area:     3.14 cm  LEFT ATRIUM             Index LA diam:        2.60 cm 1.79 cm/m LA Vol (A2C):   21.5 ml 14.84 ml/m LA Vol (A4C):   33.7 ml 23.26 ml/m LA Biplane Vol: 27.6 ml 19.05 ml/m  AORTIC VALVE LVOT Vmax:   129.00 cm/s LVOT Vmean:  91.800 cm/s LVOT VTI:    0.248 m  AORTA Ao Root diam: 2.90 cm TRICUSPID VALVE TR Peak grad:   34.8 mmHg TR Vmax:        295.00 cm/s  SHUNTS Systemic VTI:  0.25 m Systemic Diam: 2.00 cm Buford Dresser MD Electronically signed by Buford Dresser MD Signature Date/Time: 02/17/2020/7:07:32 AM    Final      Past medical hx Past Medical History:  Diagnosis Date  . Anxiety   . Arthritis   . Atrial fibrillation ([place])   . Cerumen impaction   . COPD (chronic obstructive pulmonary disease) ([place])   .  Dysrhythmia    AF  .  Essential hypertension, benign   . Gallstones   . GERD (gastroesophageal reflux disease)   . H/O hiatal hernia   . History of nuclear stress test 10/2010   dipyridamole; normal pattern of perfusion; low risk, normal study  . 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 Mild aortic insufficiency   . Osteoporosis   . Peripheral neuropathy   . Pneumonia    states she had it twice, last time a couple of months ago  . PONV (postoperative nausea and vomiting)   . Shortness of breath    with exertion  . Unspecified hypothyroidism   . Venous insufficiency      Social History   Tobacco Use  . Smoking status: Former Smoker    Packs/day: 1.00    Years: 20.00    Pack years: 20.00    Types: Cigarettes    Start date: 64    Quit date: 09/12/1978    Years since quitting: 41.5  . Smokeless tobacco: Never Used  Vaping Use  . Vaping Use: Never used  Substance Use Topics  . Alcohol use: Not Currently    Alcohol/week: 0.0 standard drinks    Comment: occ  . Drug use: No    Ms.Gottsch reports that she quit smoking about 41 years ago. Her smoking use included cigarettes. She started smoking about 62 years ago. She has a 20.00 pack-year smoking history. She has never used smokeless tobacco. She reports previous alcohol use. She reports that she does not use drugs.  Tobacco Cessation: Former smoker quit 1980 with a 20 pack year smoking history.  Past surgical hx, Family hx, Social hx all reviewed.  Current Outpatient Medications on File Prior to Visit  Medication Sig  . albuterol (PROVENTIL) (2.5 MG/3ML) 0.083% nebulizer solution Take 2.5 mg by nebulization every 6 (six) hours as needed for wheezing or shortness of breath.  Marland Kitchen albuterol (VENTOLIN HFA) 108 (90 Base) MCG/ACT inhaler Inhale 2 puffs into the lungs every 4 (four) hours as needed for wheezing or shortness of breath.  . Budeson-Glycopyrrol-Formoterol (BREZTRI AEROSPHERE) 160-9-4.8  MCG/ACT AERO Inhale 2 puffs into the lungs in the morning and at bedtime.  . budesonide-formoterol (SYMBICORT) 160-4.5 MCG/ACT inhaler Inhale 1 puff into the lungs 2 (two) times daily.  . calcium citrate-vitamin D (CITRACAL+D) 315-200 MG-UNIT per tablet Take 1 tablet by mouth daily.  . Carboxymethylcellulose Sodium (THERATEARS OP) Place 2 drops into both eyes daily.   . cholecalciferol (VITAMIN D3) 25 MCG (1000 UNIT) tablet Take 1,000 Units by mouth daily.  Marland Kitchen diltiazem (CARDIZEM CD) 180 MG 24 hr capsule Take 180 mg by mouth daily.   Marland Kitchen ELIQUIS 2.5 MG TABS tablet Take 1 tablet by mouth twice daily (Patient taking differently: Take 2.5 mg by mouth 2 (two) times daily.)  . feeding supplement, ENSURE ENLIVE, (ENSURE ENLIVE) LIQD Take 237 mLs by mouth 2 (two) times daily between meals.  . fluticasone (FLONASE) 50 MCG/ACT nasal spray Place 2 sprays into both nostrils at bedtime.   . furosemide (LASIX) 40 MG tablet Take 40 mg by mouth daily.   Marland Kitchen guaiFENesin-dextromethorphan (ROBITUSSIN DM) 100-10 MG/5ML syrup Take 5 mLs by mouth every 6 (six) hours as needed for cough. (Patient taking differently: Take 10 mLs by mouth every 6 (six) hours as needed for cough.)  . KLOR-CON M20 20 MEQ tablet Take 20 mEq by mouth daily.   . Lactobacillus Reuteri (BIOGAIA PROBIOTIC PO) Take 1 capsule by mouth daily.  Marland Kitchen levothyroxine (SYNTHROID, LEVOTHROID) 137 MCG tablet Take  137 mcg by mouth daily before breakfast.   . losartan (COZAAR) 25 MG tablet Take 25 mg by mouth daily.   . magnesium oxide (MAG-OX) 400 MG tablet Take 400 mg by mouth daily.  . Melatonin 10 MG CAPS Take 10 mg by mouth at bedtime.  . Multiple Vitamins-Minerals (MULTIVITAMIN WITH MINERALS) tablet Take 1 tablet by mouth daily.  . polyethylene glycol (MIRALAX / GLYCOLAX) packet Take 17 g by mouth daily.  . predniSONE (DELTASONE) 10 MG tablet Take 4 tablets (40 mg total) by mouth daily with breakfast for 2 days, THEN 3 tablets (30 mg total) daily with  breakfast for 2 days, THEN 2 tablets (20 mg total) daily with breakfast for 2 days, THEN 1 tablet (10 mg total) daily with breakfast for 27 days.  . predniSONE (DELTASONE) 10 MG tablet Take 1 tablet (10 mg total) by mouth daily with breakfast.  . Teriparatide, Recombinant, (FORTEO) 620 MCG/2.48ML SOPN Inject 20 mcg into the skin daily before breakfast.    No current facility-administered medications on file prior to visit.     Allergies  Allergen Reactions  . Ace Inhibitors Cough  . Sulfur Nausea And Vomiting    Review Of Systems:  Constitutional:   No  weight loss, night sweats,  Fevers, chills, fatigue, or  lassitude.  HEENT:   No headaches,  Difficulty swallowing,  Tooth/dental problems, or  Sore throat,                No sneezing, itching, ear ache, nasal congestion, post nasal drip,   CV:  No chest pain,  Orthopnea, PND, swelling in lower extremities, anasarca, dizziness, palpitations, syncope.   GI  No heartburn, indigestion, abdominal pain, nausea, vomiting, diarrhea, change in bowel habits, loss of appetite, bloody stools.   Resp: No shortness of breath with exertion or at rest.  No excess mucus, no productive cough,  No non-productive cough,  No coughing up of blood.  No change in color of mucus.  No wheezing.  No chest wall deformity  Skin: no rash or lesions.  GU: no dysuria, change in color of urine, no urgency or frequency.  No flank pain, no hematuria   MS:  No joint pain or swelling.  No decreased range of motion.  No back pain.  Psych:  No change in mood or affect. No depression or anxiety.  No memory loss.   Vital Signs BP 112/70 (BP Location: Left Arm, Cuff Size: Normal)   Pulse 63   Temp (!) 97 F (36.1 C) (Oral)   Ht 5\' 3"  (1.6 m)   Wt 103 lb (46.7 kg)   SpO2 97%   BMI 18.25 kg/m    Physical Exam:  General- No distress,  A&Ox3, pleasant ENT: No sinus tenderness, TM clear, pale nasal mucosa, no oral exudate,no post nasal drip, no LAN Cardiac: S1,  S2, regular rate and rhythm, no murmur Chest: No wheeze/ rales/ dullness; no accessory muscle use, no nasal flaring, no sternal retractions, diminished per bases, few crackles per bases. Abd.: Soft Non-tender, ND, BS +, Body mass index is 18.25 kg/m. Ext: + clubbing No cyanosis, edema Neuro:  Physically deconditioned Skin: No rashes, No lesions, warm and dry Psych: normal mood and behavior   Assessment/Plan  Acute on chronic respiratory failure with hypoxia COPD exacerbation Plan Continue your Breztri 2 puffs twice daily until you have your Symbicort and Spiriva approved. Symbicort 2 puffs twice daily/ Spiriva is 1 puff once daily)  Call the office and we  will send in the prescription for the Spiriva. Remember to rinse mouth after use.  Continue wearing your oxygen as you have been doing. Continue prednisone 10 mg daily as you have been doing. Remember to take at Breakfast. We will send in a prescription for this.  Continue Calcium supplementation, as you have been doing.  Follow up with Dr. Lamonte Sakai in 3 months   Heart Failure Plan Follow 1.5 L Fluid restriction Lasix per PCP Follow up appointment with Renal as scheduled  Pulmonary nodule Plan Annual CT Chest ordered and due 09/2020  Magdalen Spatz, NP 03/15/2020

## 2020-03-14 NOTE — Patient Instructions (Addendum)
It is good to see you today.  Continue your Breztri 2 puffs twice daily until you have your Symbicort and Spiriva approved. Symbicort 2 puffs twice daily/ Spiriva is 1 puff once daily)  Call the office and we will send in the prescription for the Spiriva. Remember to rinse mouth after use.  Continue wearing your oxygen as you have been doing. Continue prednisone 10 mg daily as you have been doing. Remember to take at Breakfast. We will send in a prescription for this.  Continue Calcium supplementation, as you have been doing.  Continue your Lasix per your PCP Follow up with Dr. Lamonte Sakai in 3 months.  Follow up CT Chest 09/2020  To continue to follow the pulmonary nodule we are watching.  Please contact office for sooner follow up if symptoms do not improve or worsen or seek emergency care . Happy Holidays .

## 2020-03-15 ENCOUNTER — Encounter: Payer: Self-pay | Admitting: Acute Care

## 2020-03-19 DIAGNOSIS — Z9981 Dependence on supplemental oxygen: Secondary | ICD-10-CM | POA: Diagnosis not present

## 2020-03-19 DIAGNOSIS — J449 Chronic obstructive pulmonary disease, unspecified: Secondary | ICD-10-CM | POA: Diagnosis not present

## 2020-03-19 DIAGNOSIS — J441 Chronic obstructive pulmonary disease with (acute) exacerbation: Secondary | ICD-10-CM | POA: Diagnosis not present

## 2020-03-19 DIAGNOSIS — I10 Essential (primary) hypertension: Secondary | ICD-10-CM | POA: Diagnosis not present

## 2020-03-19 DIAGNOSIS — R600 Localized edema: Secondary | ICD-10-CM | POA: Diagnosis not present

## 2020-03-19 DIAGNOSIS — I2781 Cor pulmonale (chronic): Secondary | ICD-10-CM | POA: Diagnosis not present

## 2020-03-19 DIAGNOSIS — R7301 Impaired fasting glucose: Secondary | ICD-10-CM | POA: Diagnosis not present

## 2020-03-21 DIAGNOSIS — J441 Chronic obstructive pulmonary disease with (acute) exacerbation: Secondary | ICD-10-CM | POA: Diagnosis not present

## 2020-03-21 DIAGNOSIS — I130 Hypertensive heart and chronic kidney disease with heart failure and stage 1 through stage 4 chronic kidney disease, or unspecified chronic kidney disease: Secondary | ICD-10-CM | POA: Diagnosis not present

## 2020-03-21 DIAGNOSIS — I8310 Varicose veins of unspecified lower extremity with inflammation: Secondary | ICD-10-CM | POA: Diagnosis not present

## 2020-03-21 DIAGNOSIS — N189 Chronic kidney disease, unspecified: Secondary | ICD-10-CM | POA: Diagnosis not present

## 2020-03-21 DIAGNOSIS — M5416 Radiculopathy, lumbar region: Secondary | ICD-10-CM | POA: Diagnosis not present

## 2020-03-21 DIAGNOSIS — J9620 Acute and chronic respiratory failure, unspecified whether with hypoxia or hypercapnia: Secondary | ICD-10-CM | POA: Diagnosis not present

## 2020-03-21 DIAGNOSIS — M810 Age-related osteoporosis without current pathological fracture: Secondary | ICD-10-CM | POA: Diagnosis not present

## 2020-03-21 DIAGNOSIS — I5032 Chronic diastolic (congestive) heart failure: Secondary | ICD-10-CM | POA: Diagnosis not present

## 2020-03-21 DIAGNOSIS — I480 Paroxysmal atrial fibrillation: Secondary | ICD-10-CM | POA: Diagnosis not present

## 2020-03-26 ENCOUNTER — Other Ambulatory Visit (INDEPENDENT_AMBULATORY_CARE_PROVIDER_SITE_OTHER): Payer: Self-pay | Admitting: Otolaryngology

## 2020-04-02 DIAGNOSIS — I130 Hypertensive heart and chronic kidney disease with heart failure and stage 1 through stage 4 chronic kidney disease, or unspecified chronic kidney disease: Secondary | ICD-10-CM | POA: Diagnosis not present

## 2020-04-02 DIAGNOSIS — I5032 Chronic diastolic (congestive) heart failure: Secondary | ICD-10-CM | POA: Diagnosis not present

## 2020-04-02 DIAGNOSIS — M5416 Radiculopathy, lumbar region: Secondary | ICD-10-CM | POA: Diagnosis not present

## 2020-04-02 DIAGNOSIS — N189 Chronic kidney disease, unspecified: Secondary | ICD-10-CM | POA: Diagnosis not present

## 2020-04-02 DIAGNOSIS — J441 Chronic obstructive pulmonary disease with (acute) exacerbation: Secondary | ICD-10-CM | POA: Diagnosis not present

## 2020-04-02 DIAGNOSIS — M810 Age-related osteoporosis without current pathological fracture: Secondary | ICD-10-CM | POA: Diagnosis not present

## 2020-04-02 DIAGNOSIS — I480 Paroxysmal atrial fibrillation: Secondary | ICD-10-CM | POA: Diagnosis not present

## 2020-04-02 DIAGNOSIS — J9620 Acute and chronic respiratory failure, unspecified whether with hypoxia or hypercapnia: Secondary | ICD-10-CM | POA: Diagnosis not present

## 2020-04-02 DIAGNOSIS — I8310 Varicose veins of unspecified lower extremity with inflammation: Secondary | ICD-10-CM | POA: Diagnosis not present

## 2020-04-04 DIAGNOSIS — I739 Peripheral vascular disease, unspecified: Secondary | ICD-10-CM | POA: Diagnosis not present

## 2020-04-04 DIAGNOSIS — R110 Nausea: Secondary | ICD-10-CM | POA: Diagnosis not present

## 2020-04-04 DIAGNOSIS — I130 Hypertensive heart and chronic kidney disease with heart failure and stage 1 through stage 4 chronic kidney disease, or unspecified chronic kidney disease: Secondary | ICD-10-CM | POA: Diagnosis not present

## 2020-04-04 DIAGNOSIS — I5032 Chronic diastolic (congestive) heart failure: Secondary | ICD-10-CM | POA: Diagnosis not present

## 2020-04-06 DIAGNOSIS — I5032 Chronic diastolic (congestive) heart failure: Secondary | ICD-10-CM | POA: Diagnosis not present

## 2020-04-06 DIAGNOSIS — I8310 Varicose veins of unspecified lower extremity with inflammation: Secondary | ICD-10-CM | POA: Diagnosis not present

## 2020-04-06 DIAGNOSIS — I130 Hypertensive heart and chronic kidney disease with heart failure and stage 1 through stage 4 chronic kidney disease, or unspecified chronic kidney disease: Secondary | ICD-10-CM | POA: Diagnosis not present

## 2020-04-06 DIAGNOSIS — N189 Chronic kidney disease, unspecified: Secondary | ICD-10-CM | POA: Diagnosis not present

## 2020-04-06 DIAGNOSIS — J9620 Acute and chronic respiratory failure, unspecified whether with hypoxia or hypercapnia: Secondary | ICD-10-CM | POA: Diagnosis not present

## 2020-04-06 DIAGNOSIS — M810 Age-related osteoporosis without current pathological fracture: Secondary | ICD-10-CM | POA: Diagnosis not present

## 2020-04-06 DIAGNOSIS — M5416 Radiculopathy, lumbar region: Secondary | ICD-10-CM | POA: Diagnosis not present

## 2020-04-06 DIAGNOSIS — J441 Chronic obstructive pulmonary disease with (acute) exacerbation: Secondary | ICD-10-CM | POA: Diagnosis not present

## 2020-04-06 DIAGNOSIS — I480 Paroxysmal atrial fibrillation: Secondary | ICD-10-CM | POA: Diagnosis not present

## 2020-04-10 ENCOUNTER — Other Ambulatory Visit: Payer: Self-pay

## 2020-04-10 ENCOUNTER — Encounter: Payer: Self-pay | Admitting: Podiatry

## 2020-04-10 ENCOUNTER — Ambulatory Visit: Payer: Medicare HMO | Admitting: Podiatry

## 2020-04-10 DIAGNOSIS — M79675 Pain in left toe(s): Secondary | ICD-10-CM | POA: Diagnosis not present

## 2020-04-10 DIAGNOSIS — B351 Tinea unguium: Secondary | ICD-10-CM

## 2020-04-10 DIAGNOSIS — M79674 Pain in right toe(s): Secondary | ICD-10-CM | POA: Diagnosis not present

## 2020-04-11 DIAGNOSIS — I480 Paroxysmal atrial fibrillation: Secondary | ICD-10-CM | POA: Diagnosis not present

## 2020-04-11 DIAGNOSIS — I5032 Chronic diastolic (congestive) heart failure: Secondary | ICD-10-CM | POA: Diagnosis not present

## 2020-04-11 DIAGNOSIS — M810 Age-related osteoporosis without current pathological fracture: Secondary | ICD-10-CM | POA: Diagnosis not present

## 2020-04-11 DIAGNOSIS — N189 Chronic kidney disease, unspecified: Secondary | ICD-10-CM | POA: Diagnosis not present

## 2020-04-11 DIAGNOSIS — M5416 Radiculopathy, lumbar region: Secondary | ICD-10-CM | POA: Diagnosis not present

## 2020-04-11 DIAGNOSIS — I130 Hypertensive heart and chronic kidney disease with heart failure and stage 1 through stage 4 chronic kidney disease, or unspecified chronic kidney disease: Secondary | ICD-10-CM | POA: Diagnosis not present

## 2020-04-11 DIAGNOSIS — J441 Chronic obstructive pulmonary disease with (acute) exacerbation: Secondary | ICD-10-CM | POA: Diagnosis not present

## 2020-04-11 DIAGNOSIS — I8310 Varicose veins of unspecified lower extremity with inflammation: Secondary | ICD-10-CM | POA: Diagnosis not present

## 2020-04-11 DIAGNOSIS — J9620 Acute and chronic respiratory failure, unspecified whether with hypoxia or hypercapnia: Secondary | ICD-10-CM | POA: Diagnosis not present

## 2020-04-12 ENCOUNTER — Other Ambulatory Visit: Payer: Self-pay | Admitting: Pulmonary Disease

## 2020-04-12 ENCOUNTER — Telehealth: Payer: Self-pay | Admitting: Emergency Medicine

## 2020-04-12 ENCOUNTER — Telehealth: Payer: Self-pay | Admitting: Internal Medicine

## 2020-04-12 DIAGNOSIS — I480 Paroxysmal atrial fibrillation: Secondary | ICD-10-CM | POA: Diagnosis not present

## 2020-04-12 DIAGNOSIS — M810 Age-related osteoporosis without current pathological fracture: Secondary | ICD-10-CM | POA: Diagnosis not present

## 2020-04-12 DIAGNOSIS — M5416 Radiculopathy, lumbar region: Secondary | ICD-10-CM | POA: Diagnosis not present

## 2020-04-12 DIAGNOSIS — N189 Chronic kidney disease, unspecified: Secondary | ICD-10-CM | POA: Diagnosis not present

## 2020-04-12 DIAGNOSIS — J441 Chronic obstructive pulmonary disease with (acute) exacerbation: Secondary | ICD-10-CM | POA: Diagnosis not present

## 2020-04-12 DIAGNOSIS — I8310 Varicose veins of unspecified lower extremity with inflammation: Secondary | ICD-10-CM | POA: Diagnosis not present

## 2020-04-12 DIAGNOSIS — I5032 Chronic diastolic (congestive) heart failure: Secondary | ICD-10-CM | POA: Diagnosis not present

## 2020-04-12 DIAGNOSIS — J9620 Acute and chronic respiratory failure, unspecified whether with hypoxia or hypercapnia: Secondary | ICD-10-CM | POA: Diagnosis not present

## 2020-04-12 DIAGNOSIS — I130 Hypertensive heart and chronic kidney disease with heart failure and stage 1 through stage 4 chronic kidney disease, or unspecified chronic kidney disease: Secondary | ICD-10-CM | POA: Diagnosis not present

## 2020-04-12 MED ORDER — SPIRIVA RESPIMAT 2.5 MCG/ACT IN AERS: 2.0000 | INHALATION_SPRAY | Freq: Every day | RESPIRATORY_TRACT | 0 refills | Status: DC

## 2020-04-12 MED ORDER — APIXABAN 2.5 MG PO TABS
2.5000 mg | ORAL_TABLET | Freq: Two times a day (BID) | ORAL | 0 refills | Status: DC
Start: 2020-04-12 — End: 2020-08-14

## 2020-04-12 NOTE — Telephone Encounter (Signed)
*  STAT* If patient is at the pharmacy, call can be transferred to refill team.   1. Which medications need to be refilled? (please list name of each medication and dose if known) ELIQUIS 2.5 MG TABS tablet  2. Which pharmacy/location (including street and city if local pharmacy) is medication to be sent to? [facility] ([facility] [place] - [address]  3. Do they need a 30 day or 90 day supply? 90 day

## 2020-04-12 NOTE — Telephone Encounter (Signed)
I have logged samples and will set them upfront to be picked up.

## 2020-04-12 NOTE — Telephone Encounter (Signed)
04/12/20  Contacted patient's daughter [name]. [name] requesting prescription for Spiriva Respimat.  Patient is currently maintained on Breztri and doing well. She is applied for patient assistance coverage for Symbicort 160 and is in the process of patient assistance forms for BI to cover Spiriva Respimat 2.5. Vi patient assistance forms have not yet been processed that should be updated within the next month.  Patient has Symbicort 160 from [employer].  Spiriva Respimat samples to be provided for the patient today. Patient will need to be instructed on how to use these. Please do this when the patient's daughter picks up the inhalers.  Spiriva Respimat 2.5 >>> 2 puffs daily >>> Do this every day >>>This is not a rescue inhaler  Also reviewed with [name] that if patient prefers Breztri patient could simply receive this through the [employer] patient assistance program.  They will let us know their thoughts.  Will route to Amy so samples can be signed out.  Wyn Quaker, FNP

## 2020-04-13 DIAGNOSIS — N189 Chronic kidney disease, unspecified: Secondary | ICD-10-CM | POA: Diagnosis not present

## 2020-04-13 DIAGNOSIS — J9620 Acute and chronic respiratory failure, unspecified whether with hypoxia or hypercapnia: Secondary | ICD-10-CM | POA: Diagnosis not present

## 2020-04-13 DIAGNOSIS — I130 Hypertensive heart and chronic kidney disease with heart failure and stage 1 through stage 4 chronic kidney disease, or unspecified chronic kidney disease: Secondary | ICD-10-CM | POA: Diagnosis not present

## 2020-04-13 DIAGNOSIS — I5032 Chronic diastolic (congestive) heart failure: Secondary | ICD-10-CM | POA: Diagnosis not present

## 2020-04-13 DIAGNOSIS — M810 Age-related osteoporosis without current pathological fracture: Secondary | ICD-10-CM | POA: Diagnosis not present

## 2020-04-13 DIAGNOSIS — J441 Chronic obstructive pulmonary disease with (acute) exacerbation: Secondary | ICD-10-CM | POA: Diagnosis not present

## 2020-04-13 DIAGNOSIS — I8310 Varicose veins of unspecified lower extremity with inflammation: Secondary | ICD-10-CM | POA: Diagnosis not present

## 2020-04-13 DIAGNOSIS — M5416 Radiculopathy, lumbar region: Secondary | ICD-10-CM | POA: Diagnosis not present

## 2020-04-13 DIAGNOSIS — I480 Paroxysmal atrial fibrillation: Secondary | ICD-10-CM | POA: Diagnosis not present

## 2020-04-13 DIAGNOSIS — J449 Chronic obstructive pulmonary disease, unspecified: Secondary | ICD-10-CM | POA: Diagnosis not present

## 2020-04-13 DIAGNOSIS — U071 COVID-19: Secondary | ICD-10-CM | POA: Diagnosis not present

## 2020-04-14 NOTE — Progress Notes (Signed)
Subjective:  Patient ID: Heather [name], female    DOB: [birth year],  MRN: [id number]  Heather [name] presents to clinic today for painful thick toenails that are difficult to trim. Pain interferes with ambulation. Aggravating factors include wearing enclosed shoe gear. Pain is relieved with periodic professional debridement..  87 y.o. female presents with the above complaint.  PCP is Dr. Burnard Bunting and last visit was 02/07/2020.  Review of Systems: Negative except as noted in the HPI. Past Medical History:  Diagnosis Date  . Anxiety   . Arthritis   . Atrial fibrillation ([place])   . Cerumen impaction   . COPD (chronic obstructive pulmonary disease) ([place])   . Dysrhythmia    AF  . Essential hypertension, benign   . Gallstones   . GERD (gastroesophageal reflux disease)   . H/O hiatal hernia   . History of nuclear stress test 10/2010   dipyridamole; normal pattern of perfusion; low risk, normal study  . 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 Mild aortic insufficiency   . Osteoporosis   . Peripheral neuropathy   . Pneumonia    states she had it twice, last time a couple of months ago  . PONV (postoperative nausea and vomiting)   . Shortness of breath    with exertion  . Unspecified hypothyroidism   . Venous insufficiency    Past Surgical History:  Procedure Laterality Date  . APPENDECTOMY  1935  . BACK SURGERY     x2  . Cardiometablic Testing  5/80/9983   submaximal effort with peak RER of 0.5, peak VO2 79% predicted; HR peak up to 78%; PVC was 55% predicted, PEV1 39% predicted; PEV1/VC ratio was reduced; normal vital capacity; DLCO was reduced to 63%  . CARPAL TUNNEL RELEASE    . CATARACT EXTRACTION W/ INTRAOCULAR LENS  IMPLANT, BILATERAL    . CHOLECYSTECTOMY  04/07/2014   dr toth  . CHOLECYSTECTOMY N/A 04/07/2014   Procedure: LAPAROSCOPIC CHOLECYSTECTOMY WITH INTRAOPERATIVE CHOLANGIOGRAM POSSBILE OPEN;  Surgeon: Autumn Messing III, MD;   Location: [facility];  Service: General;  Laterality: N/A;  . COLON SURGERY  2008   partial  . ESOPHAGOGASTRODUODENOSCOPY (EGD) WITH PROPOFOL N/A 05/25/2017   Procedure: ESOPHAGOGASTRODUODENOSCOPY (EGD) WITH PROPOFOL;  Surgeon: Clarene Essex, MD;  Location: [facility];  Service: Endoscopy;  Laterality: N/A;  . HERNIA REPAIR    . JOINT REPLACEMENT    . left hip relacement  2000  . SAVORY DILATION N/A 05/25/2017   Procedure: SAVORY DILATION;  Surgeon: Clarene Essex, MD;  Location: [facility] ENDOSCOPY;  Service: Endoscopy;  Laterality: N/A;  . TONSILLECTOMY  1935  . TOTAL ABDOMINAL HYSTERECTOMY  1970  . TRANSTHORACIC ECHOCARDIOGRAM  05/2011   EF=>55%; mild conc LVH; mild mitral annular calcif; mild TR; AV mildly sclerotic & mild AR  . VENTRAL HERNIA REPAIR  09/19/2011   Procedure: LAPAROSCOPIC VENTRAL HERNIA;  Surgeon: Merrie Roof, MD;  Location: [place];  Service: General;  Laterality: N/A;  laparoscopic ventral hernia repair with mesh    Current Outpatient Medications:  .  albuterol (PROVENTIL) (2.5 MG/3ML) 0.083% nebulizer solution, Take 2.5 mg by nebulization every 6 (six) hours as needed for wheezing or shortness of breath., Disp: , Rfl:  .  albuterol (VENTOLIN HFA) 108 (90 Base) MCG/ACT inhaler, Inhale 2 puffs into the lungs every 4 (four) hours as needed for wheezing or shortness of breath., Disp: 18 g, Rfl: 5 .  apixaban (ELIQUIS) 2.5 MG TABS tablet, Take 1 tablet (2.5  mg total) by mouth 2 (two) times daily., Disp: 180 tablet, Rfl: 0 .  Budeson-Glycopyrrol-Formoterol (BREZTRI AEROSPHERE) 160-9-4.8 MCG/ACT AERO, Inhale 2 puffs into the lungs in the morning and at bedtime., Disp: 5.9 g, Rfl: 0 .  budesonide-formoterol (SYMBICORT) 160-4.5 MCG/ACT inhaler, Inhale 1 puff into the lungs 2 (two) times daily., Disp: 1 each, Rfl: 6 .  calcium citrate-vitamin D (CITRACAL+D) 315-200 MG-UNIT per tablet, Take 1 tablet by mouth daily., Disp: , Rfl:  .  Carboxymethylcellulose Sodium (THERATEARS OP), Place 2 drops  into both eyes daily. , Disp: , Rfl:  .  cholecalciferol (VITAMIN D3) 25 MCG (1000 UNIT) tablet, Take 1,000 Units by mouth daily., Disp: , Rfl:  .  diltiazem (CARDIZEM CD) 180 MG 24 hr capsule, Take 180 mg by mouth daily. , Disp: , Rfl:  .  feeding supplement, ENSURE ENLIVE, (ENSURE ENLIVE) LIQD, Take 237 mLs by mouth 2 (two) times daily between meals., Disp: 237 mL, Rfl: 12 .  fluticasone (FLONASE) 50 MCG/ACT nasal spray, USE 2 SPRAY(S) IN EACH NOSTRIL ONCE DAILY IN THE EVENING, Disp: 16 g, Rfl: 0 .  furosemide (LASIX) 40 MG tablet, Take 40 mg by mouth daily. , Disp: , Rfl:  .  guaiFENesin-dextromethorphan (ROBITUSSIN DM) 100-10 MG/5ML syrup, Take 5 mLs by mouth every 6 (six) hours as needed for cough. (Patient taking differently: Take 10 mLs by mouth every 6 (six) hours as needed for cough.), Disp: 118 mL, Rfl: 0 .  KLOR-CON M20 20 MEQ tablet, Take 20 mEq by mouth daily. , Disp: , Rfl:  .  Lactobacillus Reuteri (BIOGAIA PROBIOTIC PO), Take 1 capsule by mouth daily., Disp: , Rfl:  .  levothyroxine (SYNTHROID, LEVOTHROID) 137 MCG tablet, Take 137 mcg by mouth daily before breakfast. , Disp: , Rfl:  .  losartan (COZAAR) 25 MG tablet, Take 25 mg by mouth daily. , Disp: , Rfl:  .  magnesium oxide (MAG-OX) 400 MG tablet, Take 400 mg by mouth daily., Disp: , Rfl:  .  Melatonin 10 MG CAPS, Take 10 mg by mouth at bedtime., Disp: , Rfl:  .  Multiple Vitamins-Minerals (MULTIVITAMIN WITH MINERALS) tablet, Take 1 tablet by mouth daily., Disp: , Rfl:  .  polyethylene glycol (MIRALAX / GLYCOLAX) packet, Take 17 g by mouth daily., Disp: , Rfl:  .  predniSONE (DELTASONE) 10 MG tablet, Take 1 tablet (10 mg total) by mouth daily with breakfast., Disp: 100 tablet, Rfl: 0 .  Teriparatide, Recombinant, (FORTEO) 620 MCG/2.48ML SOPN, Inject 20 mcg into the skin daily before breakfast. , Disp: , Rfl:  .  Tiotropium Bromide Monohydrate (SPIRIVA RESPIMAT) 2.5 MCG/ACT AERS, Inhale 2 puffs into the lungs daily., Disp: 3 each,  Rfl: 0 Allergies  Allergen Reactions  . Ace Inhibitors Cough  . Sulfur Nausea And Vomiting   Social History   Occupational History  . Occupation: Retired Astronomer  Tobacco Use  . Smoking status: Former Smoker    Packs/day: 1.00    Years: 20.00    Pack years: 20.00    Types: Cigarettes    Start date: 72    Quit date: 09/12/1978    Years since quitting: 41.6  . Smokeless tobacco: Never Used  Vaping Use  . Vaping Use: Never used  Substance and Sexual Activity  . Alcohol use: Not Currently    Alcohol/week: 0.0 standard drinks    Comment: occ  . Drug use: No  . Sexual activity: Yes    Objective:   Constitutional Heather [name] is a pleasant  87 y.o. Caucasian female, WD, WN in NAD.Marland Kitchen AAO x 3.   Vascular Capillary fill time to digits <3 seconds b/l lower extremities. Faintly palpable pedal pulses b/l. Pedal hair sparse. Lower extremity skin temperature gradient within normal limits. No pain with calf compression b/l. No cyanosis or clubbing noted.  Neurologic Normal speech. Oriented to person, place, and time. Epicritic sensation to light touch grossly present bilaterally. Protective sensation intact 5/5 intact bilaterally with 10g monofilament b/l. Vibratory sensation intact b/l.  Dermatologic Pedal skin with normal turgor, texture and tone bilaterally. No open wounds bilaterally. No interdigital macerations bilaterally. Toenails 2-5 bilaterally elongated, discolored, dystrophic, thickened, and crumbly with subungual debris and tenderness to dorsal palpation. Anonychia noted L hallux and R hallux. Nailbed(s) epithelialized.   Orthopedic: Normal muscle strength 5/5 to all lower extremity muscle groups bilaterally. No pain crepitus or joint limitation noted with ROM b/l. No gross bony deformities bilaterally.   Radiographs: None Assessment:   1. Pain due to onychomycosis of toenails of both feet    Plan:  Patient was evaluated and treated and all questions  answered.  Onychomycosis with pain -Nails palliatively debridement as below -Educated on self-care  Procedure: Nail Debridement Rationale: Pain Type of Debridement: manual, sharp debridement. Instrumentation: Nail nipper, rotary burr. Number of Nails: 8 -Examined patient. -No new findings. No new orders. -Toenails 2-5 bilaterally debrided in length and girth without iatrogenic bleeding with sterile nail nipper and dremel.  -Patient to report any pedal injuries to medical professional immediately. -Patient to continue soft, supportive shoe gear daily. -Patient/POA to call should there be question/concern in the interim.  Return in about 3 months (around 07/09/2020).  Marzetta Board, DPM

## 2020-04-17 ENCOUNTER — Telehealth: Payer: Self-pay | Admitting: Emergency Medicine

## 2020-04-17 DIAGNOSIS — J449 Chronic obstructive pulmonary disease, unspecified: Secondary | ICD-10-CM | POA: Diagnosis not present

## 2020-04-17 NOTE — Telephone Encounter (Signed)
lmtcb for pt's daughter, [name].

## 2020-04-18 DIAGNOSIS — I5032 Chronic diastolic (congestive) heart failure: Secondary | ICD-10-CM | POA: Diagnosis not present

## 2020-04-18 DIAGNOSIS — N189 Chronic kidney disease, unspecified: Secondary | ICD-10-CM | POA: Diagnosis not present

## 2020-04-18 DIAGNOSIS — I130 Hypertensive heart and chronic kidney disease with heart failure and stage 1 through stage 4 chronic kidney disease, or unspecified chronic kidney disease: Secondary | ICD-10-CM | POA: Diagnosis not present

## 2020-04-18 DIAGNOSIS — J9620 Acute and chronic respiratory failure, unspecified whether with hypoxia or hypercapnia: Secondary | ICD-10-CM | POA: Diagnosis not present

## 2020-04-18 DIAGNOSIS — I8310 Varicose veins of unspecified lower extremity with inflammation: Secondary | ICD-10-CM | POA: Diagnosis not present

## 2020-04-18 DIAGNOSIS — M5416 Radiculopathy, lumbar region: Secondary | ICD-10-CM | POA: Diagnosis not present

## 2020-04-18 DIAGNOSIS — I480 Paroxysmal atrial fibrillation: Secondary | ICD-10-CM | POA: Diagnosis not present

## 2020-04-18 DIAGNOSIS — J441 Chronic obstructive pulmonary disease with (acute) exacerbation: Secondary | ICD-10-CM | POA: Diagnosis not present

## 2020-04-18 DIAGNOSIS — M810 Age-related osteoporosis without current pathological fracture: Secondary | ICD-10-CM | POA: Diagnosis not present

## 2020-04-19 NOTE — Telephone Encounter (Signed)
ATC Patient's Daughter [name].  LMTCB. Called and spoke with Patient, but she requested I speak with [name].  Will try again at a later time.

## 2020-04-20 MED ORDER — BREZTRI AEROSPHERE 160-9-4.8 MCG/ACT IN AERO: 2.0000 | INHALATION_SPRAY | Freq: Two times a day (BID) | RESPIRATORY_TRACT | 0 refills | Status: DC

## 2020-04-20 NOTE — Telephone Encounter (Signed)
Spoke with [name]  She is calling to see if we can provide 2 samples of Breztri since pt doing better on this and they are applying for pt assistance  2 samples up front  Aware to not use spiriva and symbicort while on this  Nothing further needed

## 2020-04-25 DIAGNOSIS — I130 Hypertensive heart and chronic kidney disease with heart failure and stage 1 through stage 4 chronic kidney disease, or unspecified chronic kidney disease: Secondary | ICD-10-CM | POA: Diagnosis not present

## 2020-04-25 DIAGNOSIS — I480 Paroxysmal atrial fibrillation: Secondary | ICD-10-CM | POA: Diagnosis not present

## 2020-04-25 DIAGNOSIS — M810 Age-related osteoporosis without current pathological fracture: Secondary | ICD-10-CM | POA: Diagnosis not present

## 2020-04-25 DIAGNOSIS — J441 Chronic obstructive pulmonary disease with (acute) exacerbation: Secondary | ICD-10-CM | POA: Diagnosis not present

## 2020-04-25 DIAGNOSIS — I5032 Chronic diastolic (congestive) heart failure: Secondary | ICD-10-CM | POA: Diagnosis not present

## 2020-04-25 DIAGNOSIS — M5416 Radiculopathy, lumbar region: Secondary | ICD-10-CM | POA: Diagnosis not present

## 2020-04-25 DIAGNOSIS — N189 Chronic kidney disease, unspecified: Secondary | ICD-10-CM | POA: Diagnosis not present

## 2020-04-25 DIAGNOSIS — I8310 Varicose veins of unspecified lower extremity with inflammation: Secondary | ICD-10-CM | POA: Diagnosis not present

## 2020-04-25 DIAGNOSIS — J9620 Acute and chronic respiratory failure, unspecified whether with hypoxia or hypercapnia: Secondary | ICD-10-CM | POA: Diagnosis not present

## 2020-04-26 ENCOUNTER — Telehealth: Payer: Self-pay | Admitting: Emergency Medicine

## 2020-04-26 NOTE — Telephone Encounter (Signed)
Called and spoke with pt's daughter [name] letting her know that samples of Heather [name] are up front for pt. [name] verbalized understanding. Nothing further needed.

## 2020-04-27 DIAGNOSIS — I480 Paroxysmal atrial fibrillation: Secondary | ICD-10-CM | POA: Diagnosis not present

## 2020-04-27 DIAGNOSIS — I5032 Chronic diastolic (congestive) heart failure: Secondary | ICD-10-CM | POA: Diagnosis not present

## 2020-04-27 DIAGNOSIS — I130 Hypertensive heart and chronic kidney disease with heart failure and stage 1 through stage 4 chronic kidney disease, or unspecified chronic kidney disease: Secondary | ICD-10-CM | POA: Diagnosis not present

## 2020-04-27 DIAGNOSIS — M5416 Radiculopathy, lumbar region: Secondary | ICD-10-CM | POA: Diagnosis not present

## 2020-04-27 DIAGNOSIS — J9620 Acute and chronic respiratory failure, unspecified whether with hypoxia or hypercapnia: Secondary | ICD-10-CM | POA: Diagnosis not present

## 2020-04-27 DIAGNOSIS — M810 Age-related osteoporosis without current pathological fracture: Secondary | ICD-10-CM | POA: Diagnosis not present

## 2020-04-27 DIAGNOSIS — I8310 Varicose veins of unspecified lower extremity with inflammation: Secondary | ICD-10-CM | POA: Diagnosis not present

## 2020-04-27 DIAGNOSIS — J441 Chronic obstructive pulmonary disease with (acute) exacerbation: Secondary | ICD-10-CM | POA: Diagnosis not present

## 2020-04-27 DIAGNOSIS — N189 Chronic kidney disease, unspecified: Secondary | ICD-10-CM | POA: Diagnosis not present

## 2020-04-28 DIAGNOSIS — I110 Hypertensive heart disease with heart failure: Secondary | ICD-10-CM | POA: Diagnosis not present

## 2020-04-28 DIAGNOSIS — M810 Age-related osteoporosis without current pathological fracture: Secondary | ICD-10-CM | POA: Diagnosis not present

## 2020-04-28 DIAGNOSIS — I5032 Chronic diastolic (congestive) heart failure: Secondary | ICD-10-CM | POA: Diagnosis not present

## 2020-04-30 DIAGNOSIS — I480 Paroxysmal atrial fibrillation: Secondary | ICD-10-CM | POA: Diagnosis not present

## 2020-04-30 DIAGNOSIS — I5032 Chronic diastolic (congestive) heart failure: Secondary | ICD-10-CM | POA: Diagnosis not present

## 2020-04-30 DIAGNOSIS — N189 Chronic kidney disease, unspecified: Secondary | ICD-10-CM | POA: Diagnosis not present

## 2020-04-30 DIAGNOSIS — M5416 Radiculopathy, lumbar region: Secondary | ICD-10-CM | POA: Diagnosis not present

## 2020-04-30 DIAGNOSIS — J441 Chronic obstructive pulmonary disease with (acute) exacerbation: Secondary | ICD-10-CM | POA: Diagnosis not present

## 2020-04-30 DIAGNOSIS — I130 Hypertensive heart and chronic kidney disease with heart failure and stage 1 through stage 4 chronic kidney disease, or unspecified chronic kidney disease: Secondary | ICD-10-CM | POA: Diagnosis not present

## 2020-04-30 DIAGNOSIS — I8310 Varicose veins of unspecified lower extremity with inflammation: Secondary | ICD-10-CM | POA: Diagnosis not present

## 2020-04-30 DIAGNOSIS — J9620 Acute and chronic respiratory failure, unspecified whether with hypoxia or hypercapnia: Secondary | ICD-10-CM | POA: Diagnosis not present

## 2020-04-30 DIAGNOSIS — M810 Age-related osteoporosis without current pathological fracture: Secondary | ICD-10-CM | POA: Diagnosis not present

## 2020-05-02 ENCOUNTER — Other Ambulatory Visit: Payer: Self-pay | Admitting: Gastroenterology

## 2020-05-02 DIAGNOSIS — R1310 Dysphagia, unspecified: Secondary | ICD-10-CM

## 2020-05-02 DIAGNOSIS — R1311 Dysphagia, oral phase: Secondary | ICD-10-CM | POA: Diagnosis not present

## 2020-05-04 ENCOUNTER — Ambulatory Visit
Admission: RE | Admit: 2020-05-04 | Discharge: 2020-05-04 | Disposition: A | Discharge status: Home / Self Care | Payer: Medicare HMO | Source: Ambulatory Visit | Attending: Gastroenterology | Admitting: Gastroenterology

## 2020-05-04 DIAGNOSIS — R1310 Dysphagia, unspecified: Secondary | ICD-10-CM

## 2020-05-04 DIAGNOSIS — J441 Chronic obstructive pulmonary disease with (acute) exacerbation: Secondary | ICD-10-CM | POA: Diagnosis not present

## 2020-05-04 DIAGNOSIS — M5416 Radiculopathy, lumbar region: Secondary | ICD-10-CM | POA: Diagnosis not present

## 2020-05-04 DIAGNOSIS — I8310 Varicose veins of unspecified lower extremity with inflammation: Secondary | ICD-10-CM | POA: Diagnosis not present

## 2020-05-04 DIAGNOSIS — M810 Age-related osteoporosis without current pathological fracture: Secondary | ICD-10-CM | POA: Diagnosis not present

## 2020-05-04 DIAGNOSIS — I480 Paroxysmal atrial fibrillation: Secondary | ICD-10-CM | POA: Diagnosis not present

## 2020-05-04 DIAGNOSIS — I130 Hypertensive heart and chronic kidney disease with heart failure and stage 1 through stage 4 chronic kidney disease, or unspecified chronic kidney disease: Secondary | ICD-10-CM | POA: Diagnosis not present

## 2020-05-04 DIAGNOSIS — K2289 Other specified disease of esophagus: Secondary | ICD-10-CM | POA: Diagnosis not present

## 2020-05-04 DIAGNOSIS — N189 Chronic kidney disease, unspecified: Secondary | ICD-10-CM | POA: Diagnosis not present

## 2020-05-04 DIAGNOSIS — I5032 Chronic diastolic (congestive) heart failure: Secondary | ICD-10-CM | POA: Diagnosis not present

## 2020-05-04 DIAGNOSIS — J9620 Acute and chronic respiratory failure, unspecified whether with hypoxia or hypercapnia: Secondary | ICD-10-CM | POA: Diagnosis not present

## 2020-05-09 DIAGNOSIS — I480 Paroxysmal atrial fibrillation: Secondary | ICD-10-CM | POA: Diagnosis not present

## 2020-05-09 DIAGNOSIS — I5032 Chronic diastolic (congestive) heart failure: Secondary | ICD-10-CM | POA: Diagnosis not present

## 2020-05-09 DIAGNOSIS — J9620 Acute and chronic respiratory failure, unspecified whether with hypoxia or hypercapnia: Secondary | ICD-10-CM | POA: Diagnosis not present

## 2020-05-09 DIAGNOSIS — J441 Chronic obstructive pulmonary disease with (acute) exacerbation: Secondary | ICD-10-CM | POA: Diagnosis not present

## 2020-05-09 DIAGNOSIS — M810 Age-related osteoporosis without current pathological fracture: Secondary | ICD-10-CM | POA: Diagnosis not present

## 2020-05-09 DIAGNOSIS — M5416 Radiculopathy, lumbar region: Secondary | ICD-10-CM | POA: Diagnosis not present

## 2020-05-09 DIAGNOSIS — N189 Chronic kidney disease, unspecified: Secondary | ICD-10-CM | POA: Diagnosis not present

## 2020-05-09 DIAGNOSIS — I8310 Varicose veins of unspecified lower extremity with inflammation: Secondary | ICD-10-CM | POA: Diagnosis not present

## 2020-05-09 DIAGNOSIS — I130 Hypertensive heart and chronic kidney disease with heart failure and stage 1 through stage 4 chronic kidney disease, or unspecified chronic kidney disease: Secondary | ICD-10-CM | POA: Diagnosis not present

## 2020-05-10 ENCOUNTER — Telehealth: Payer: Self-pay | Admitting: Emergency Medicine

## 2020-05-10 MED ORDER — BREZTRI AEROSPHERE 160-9-4.8 MCG/ACT IN AERO: 2.0000 | INHALATION_SPRAY | Freq: Two times a day (BID) | RESPIRATORY_TRACT | 0 refills | Status: DC

## 2020-05-10 NOTE — Telephone Encounter (Signed)
Call made to patient daughter valerie, confirmed patient DOB. Made aware 2 samples have been placed up front. Aware I will route message to MD nurse regarding paperwork. Voiced understanding.

## 2020-05-11 DIAGNOSIS — J441 Chronic obstructive pulmonary disease with (acute) exacerbation: Secondary | ICD-10-CM | POA: Diagnosis not present

## 2020-05-11 DIAGNOSIS — M5416 Radiculopathy, lumbar region: Secondary | ICD-10-CM | POA: Diagnosis not present

## 2020-05-11 DIAGNOSIS — I5032 Chronic diastolic (congestive) heart failure: Secondary | ICD-10-CM | POA: Diagnosis not present

## 2020-05-11 DIAGNOSIS — M810 Age-related osteoporosis without current pathological fracture: Secondary | ICD-10-CM | POA: Diagnosis not present

## 2020-05-11 DIAGNOSIS — I480 Paroxysmal atrial fibrillation: Secondary | ICD-10-CM | POA: Diagnosis not present

## 2020-05-11 DIAGNOSIS — I130 Hypertensive heart and chronic kidney disease with heart failure and stage 1 through stage 4 chronic kidney disease, or unspecified chronic kidney disease: Secondary | ICD-10-CM | POA: Diagnosis not present

## 2020-05-11 DIAGNOSIS — I8310 Varicose veins of unspecified lower extremity with inflammation: Secondary | ICD-10-CM | POA: Diagnosis not present

## 2020-05-11 DIAGNOSIS — N189 Chronic kidney disease, unspecified: Secondary | ICD-10-CM | POA: Diagnosis not present

## 2020-05-11 DIAGNOSIS — J9620 Acute and chronic respiratory failure, unspecified whether with hypoxia or hypercapnia: Secondary | ICD-10-CM | POA: Diagnosis not present

## 2020-05-14 ENCOUNTER — Other Ambulatory Visit (INDEPENDENT_AMBULATORY_CARE_PROVIDER_SITE_OTHER): Payer: Self-pay | Admitting: Otolaryngology

## 2020-05-14 DIAGNOSIS — U071 COVID-19: Secondary | ICD-10-CM | POA: Diagnosis not present

## 2020-05-14 DIAGNOSIS — J449 Chronic obstructive pulmonary disease, unspecified: Secondary | ICD-10-CM | POA: Diagnosis not present

## 2020-05-14 NOTE — Telephone Encounter (Signed)
Spoke with repersenitive at [employer] about [place] pt asst. Repersenitive states they do not have a application on file for pt for this year. Spoke with pt's daughter (per DPR) who states [employer] stated our office just needed to send in signed Rx but no paperwork pertaining to medication was found in providers box. Attempted to call [employer] but was placed on hold for 20 mins. Will attempt to call back at later time.

## 2020-05-15 DIAGNOSIS — J449 Chronic obstructive pulmonary disease, unspecified: Secondary | ICD-10-CM | POA: Diagnosis not present

## 2020-05-16 ENCOUNTER — Telehealth: Payer: Self-pay | Admitting: Emergency Medicine

## 2020-05-17 NOTE — Telephone Encounter (Signed)
Searched through Dr. Agustina Caroli box for any paperwork r/t pt or RX. Nothing was found.Call [facility] on phone number provided but no one answered. Left voice to return my call because I am not sure what paperwork [facility] P[facility] is referring to. Will attempt to contact tomorrow.

## 2020-05-17 NOTE — Telephone Encounter (Signed)
ATC Caryl Pina however, their office is not open yet [phone]).  Estill Bamberg have these forms come to [facility]'s look ats yet? Thanks.

## 2020-05-21 ENCOUNTER — Other Ambulatory Visit: Payer: Self-pay

## 2020-05-21 MED ORDER — BREZTRI AEROSPHERE 160-9-4.8 MCG/ACT IN AERO: 2.0000 | INHALATION_SPRAY | Freq: Two times a day (BID) | RESPIRATORY_TRACT | 0 refills | Status: DC

## 2020-05-21 NOTE — Telephone Encounter (Signed)
LMTCB with ashley regarding paper work.

## 2020-05-22 ENCOUNTER — Ambulatory Visit (INDEPENDENT_AMBULATORY_CARE_PROVIDER_SITE_OTHER): Payer: Medicare HMO | Admitting: Otolaryngology

## 2020-05-22 ENCOUNTER — Other Ambulatory Visit: Payer: Self-pay

## 2020-05-22 ENCOUNTER — Encounter (INDEPENDENT_AMBULATORY_CARE_PROVIDER_SITE_OTHER): Payer: Self-pay | Admitting: Otolaryngology

## 2020-05-22 VITALS — Temp 97.7°F

## 2020-05-22 DIAGNOSIS — R1314 Dysphagia, pharyngoesophageal phase: Secondary | ICD-10-CM | POA: Diagnosis not present

## 2020-05-22 NOTE — Telephone Encounter (Signed)
Breztri paperwork completed and faxed along with signed Rx was faxed to [employer]. Nothing further needed at this time

## 2020-05-22 NOTE — Progress Notes (Signed)
HPI: Heather [name] is a 87 y.o. female who presents is referred by Dr. Watt Climes for evaluation of dysphagia.  Patient apparently has had a long history of chronic dysphagia.  She has had several barium swallows in the past.  Her most recent barium swallow performed several weeks ago demonstrated a limited examination but no evidence of aspiration.  Mild cricopharyngeal muscle dysfunction, characteristic of chronic GE reflux disease which is unchanged.  She also has press to be esophagitis which is unchanged no evidence of esophageal mass or stricture. Patient is on chronic O2 24 hours a day.  She would not be a surgical candidate for any Botox injection that was possibly suggested..  Past Medical History:  Diagnosis Date  . Anxiety   . Arthritis   . Atrial fibrillation ([place])   . Cerumen impaction   . COPD (chronic obstructive pulmonary disease) ([place])   . Dysrhythmia    AF  . Essential hypertension, benign   . Gallstones   . GERD (gastroesophageal reflux disease)   . H/O hiatal hernia   . History of nuclear stress test 10/2010   dipyridamole; normal pattern of perfusion; low risk, normal study  . 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 Mild aortic insufficiency   . Osteoporosis   . Peripheral neuropathy   . Pneumonia    states she had it twice, last time a couple of months ago  . PONV (postoperative nausea and vomiting)   . Shortness of breath    with exertion  . Unspecified hypothyroidism   . Venous insufficiency    Past Surgical History:  Procedure Laterality Date  . APPENDECTOMY  1935  . BACK SURGERY     x2  . Cardiometablic Testing  2/45/8099   submaximal effort with peak RER of 0.5, peak VO2 79% predicted; HR peak up to 78%; PVC was 55% predicted, PEV1 39% predicted; PEV1/VC ratio was reduced; normal vital capacity; DLCO was reduced to 63%  . CARPAL TUNNEL RELEASE    . CATARACT EXTRACTION W/ INTRAOCULAR LENS  IMPLANT, BILATERAL    .  CHOLECYSTECTOMY  04/07/2014   dr toth  . CHOLECYSTECTOMY N/A 04/07/2014   Procedure: LAPAROSCOPIC CHOLECYSTECTOMY WITH INTRAOPERATIVE CHOLANGIOGRAM POSSBILE OPEN;  Surgeon: Autumn Messing III, MD;  Location: [facility];  Service: General;  Laterality: N/A;  . COLON SURGERY  2008   partial  . ESOPHAGOGASTRODUODENOSCOPY (EGD) WITH PROPOFOL N/A 05/25/2017   Procedure: ESOPHAGOGASTRODUODENOSCOPY (EGD) WITH PROPOFOL;  Surgeon: Clarene Essex, MD;  Location: [facility];  Service: Endoscopy;  Laterality: N/A;  . HERNIA REPAIR    . JOINT REPLACEMENT    . left hip relacement  2000  . SAVORY DILATION N/A 05/25/2017   Procedure: SAVORY DILATION;  Surgeon: Clarene Essex, MD;  Location: [facility] ENDOSCOPY;  Service: Endoscopy;  Laterality: N/A;  . TONSILLECTOMY  1935  . TOTAL ABDOMINAL HYSTERECTOMY  1970  . TRANSTHORACIC ECHOCARDIOGRAM  05/2011   EF=>55%; mild conc LVH; mild mitral annular calcif; mild TR; AV mildly sclerotic & mild AR  . VENTRAL HERNIA REPAIR  09/19/2011   Procedure: LAPAROSCOPIC VENTRAL HERNIA;  Surgeon: Merrie Roof, MD;  Location: [place];  Service: General;  Laterality: N/A;  laparoscopic ventral hernia repair with mesh   Social History   Socioeconomic History  . Marital status: Married    Spouse name: Not on file  . Number of children: 2  . Years of education: Not on file  . Highest education level: Not on file  Occupational History  .  Occupation: Retired Astronomer  Tobacco Use  . Smoking status: Former Smoker    Packs/day: 1.00    Years: 20.00    Pack years: 20.00    Types: Cigarettes    Start date: 26    Quit date: 09/12/1978    Years since quitting: 41.7  . Smokeless tobacco: Never Used  Vaping Use  . Vaping Use: Never used  Substance and Sexual Activity  . Alcohol use: Not Currently    Alcohol/week: 0.0 standard drinks    Comment: occ  . Drug use: No  . Sexual activity: Yes  Other Topics Concern  . Not on file  Social History Narrative   Right handed   One story home    Drinks coffee   Social Determinants of Health   Financial Resource Strain: Not on file  Food Insecurity: Not on file  Transportation Needs: Not on file  Physical Activity: Not on file  Stress: Not on file  Social Connections: Not on file   Family History  Problem Relation Age of Onset  . Stroke Mother   . Stroke Father   . Heart block Brother   . Bladder Cancer Brother   . Diabetes Brother   . Stroke Brother    Allergies  Allergen Reactions  . Ace Inhibitors Cough  . Elemental Sulfur Nausea And Vomiting   Prior to Admission medications   Medication Sig Start Date End Date Taking? Authorizing Provider  albuterol (PROVENTIL) (2.5 MG/3ML) 0.083% nebulizer solution Take 2.5 mg by nebulization every 6 (six) hours as needed for wheezing or shortness of breath.    [provider]  albuterol (VENTOLIN HFA) 108 (90 Base) MCG/ACT inhaler Inhale 2 puffs into the lungs every 4 (four) hours as needed for wheezing or shortness of breath. 03/05/20   Collene Gobble, MD  apixaban (ELIQUIS) 2.5 MG TABS tablet Take 1 tablet (2.5 mg total) by mouth 2 (two) times daily. 04/12/20   Hilty, Nadean Corwin, MD  Budeson-Glycopyrrol-Formoterol (BREZTRI AEROSPHERE) 160-9-4.8 MCG/ACT AERO Inhale 2 puffs into the lungs 2 (two) times daily. 04/20/20   Collene Gobble, MD  Budeson-Glycopyrrol-Formoterol (BREZTRI AEROSPHERE) 160-9-4.8 MCG/ACT AERO Inhale 2 puffs into the lungs in the morning and at bedtime. 05/10/20   Collene Gobble, MD  Budeson-Glycopyrrol-Formoterol (BREZTRI AEROSPHERE) 160-9-4.8 MCG/ACT AERO Inhale 2 puffs into the lungs in the morning and at bedtime. 05/21/20   Collene Gobble, MD  budesonide-formoterol (SYMBICORT) 160-4.5 MCG/ACT inhaler Inhale 1 puff into the lungs 2 (two) times daily. 02/02/20   Collene Gobble, MD  calcium citrate-vitamin D (CITRACAL+D) 315-200 MG-UNIT per tablet Take 1 tablet by mouth daily.    [provider]  Carboxymethylcellulose Sodium (THERATEARS OP) Place  2 drops into both eyes daily.     [provider]  cholecalciferol (VITAMIN D3) 25 MCG (1000 UNIT) tablet Take 1,000 Units by mouth daily.    [provider]  diltiazem (CARDIZEM CD) 180 MG 24 hr capsule Take 180 mg by mouth daily.     [provider]  feeding supplement, ENSURE ENLIVE, (ENSURE ENLIVE) LIQD Take 237 mLs by mouth 2 (two) times daily between meals. 10/21/19   Manuella Ghazi, Pratik D, DO  fluticasone (FLONASE) 50 MCG/ACT nasal spray USE 2 SPRAY(S) IN EACH NOSTRIL ONCE DAILY IN THE EVENING 05/14/20   Rozetta Nunnery, MD  furosemide (LASIX) 40 MG tablet Take 40 mg by mouth daily.  11/22/19   [provider]  guaiFENesin-dextromethorphan (ROBITUSSIN DM) 100-10 MG/5ML syrup Take 5  mLs by mouth every 6 (six) hours as needed for cough. Patient taking differently: Take 10 mLs by mouth every 6 (six) hours as needed for cough. 01/11/19   Arrien, Jimmy Picket, MD  KLOR-CON M20 20 MEQ tablet Take 20 mEq by mouth daily.  12/24/19   [provider]  Lactobacillus Reuteri (BIOGAIA PROBIOTIC PO) Take 1 capsule by mouth daily.    [provider]  levothyroxine (SYNTHROID, LEVOTHROID) 137 MCG tablet Take 137 mcg by mouth daily before breakfast.     [provider]  losartan (COZAAR) 25 MG tablet Take 25 mg by mouth daily.  12/28/19   [provider]  magnesium oxide (MAG-OX) 400 MG tablet Take 400 mg by mouth daily.    [provider]  Melatonin 10 MG CAPS Take 10 mg by mouth at bedtime.    [provider]  Multiple Vitamins-Minerals (MULTIVITAMIN WITH MINERALS) tablet Take 1 tablet by mouth daily.    [provider]  polyethylene glycol (MIRALAX / GLYCOLAX) packet Take 17 g by mouth daily.    [provider]  predniSONE (DELTASONE) 10 MG tablet Take 1 tablet (10 mg total) by mouth daily with breakfast. 03/14/20   Magdalen Spatz, NP  Teriparatide, Recombinant, (FORTEO) 620 MCG/2.48ML SOPN Inject 20  mcg into the skin daily before breakfast.     [provider]  Tiotropium Bromide Monohydrate (SPIRIVA RESPIMAT) 2.5 MCG/ACT AERS Inhale 2 puffs into the lungs daily. 04/12/20   [name], NP     Positive ROS: Otherwise negative  All other systems have been reviewed and were otherwise negative with the exception of those mentioned in the HPI and as above.  Physical Exam: Constitutional: Alert, well-appearing, no acute distress Ears: External ears without lesions or tenderness. Ear canals are clear bilaterally with intact, clear TMs.  Nasal: External nose without lesions. Septum midline. Clear nasal passages bilaterally. Oral: Lips and gums without lesions. Tongue and palate mucosa without lesions. Posterior oropharynx clear. Fiberoptic laryngoscopy was performed through the right nostril.  The nasopharynx was clear.  The base of tongue vallecula epiglottis were normal.  AE folds were clear.  Both piriform sinuses were clear.  Vocal cords were clear with normal vocal mobility.  The fiberoptic laryngoscope was passed through the upper esophageal sphincter with mild discomfort but the upper cervical esophagus was clear with no obvious abnormalities noted. Neck: No palpable adenopathy or masses Respiratory: Breathing comfortably  Skin: No facial/neck lesions or rash noted.  Laryngoscopy  Date/Time: 05/22/2020 6:11 PM Performed by: Rozetta Nunnery, MD Authorized by: Rozetta Nunnery, MD   Consent:    Consent obtained:  Verbal   Consent given by:  Patient Procedure details:    Medication:  Afrin   Instrument: flexible fiberoptic laryngoscope     Scope location: right nare   Sinus:    Right nasopharynx: normal   Mouth:    Vallecula: normal     Base of tongue: normal     Epiglottis: normal   Throat:    True vocal cords: normal   Comments:     On fiberoptic laryngoscopy the fiberoptic laryngoscope was passed through the upper esophageal sphincter with mild  discomfort but no obvious abnormalities noted of the mucosal membranes.    Assessment: Chronic dysphagia  Plan: Patient would not be a candidate for Botox injection as this will require general anesthesia and she is on chronic O2. Discussed with daughter concerning regular use of her reflux medications and drinking plenty  of liquids with her meals as there is nothing obstructing her swallowing although the cricopharyngeus may be tight.   Radene Journey, MD   CC:

## 2020-05-22 NOTE — Telephone Encounter (Signed)
See phone note form 05/22/20.

## 2020-05-23 DIAGNOSIS — J441 Chronic obstructive pulmonary disease with (acute) exacerbation: Secondary | ICD-10-CM | POA: Diagnosis not present

## 2020-05-23 DIAGNOSIS — Z9981 Dependence on supplemental oxygen: Secondary | ICD-10-CM | POA: Diagnosis not present

## 2020-05-23 DIAGNOSIS — I739 Peripheral vascular disease, unspecified: Secondary | ICD-10-CM | POA: Diagnosis not present

## 2020-05-23 DIAGNOSIS — I10 Essential (primary) hypertension: Secondary | ICD-10-CM | POA: Diagnosis not present

## 2020-05-23 DIAGNOSIS — I5032 Chronic diastolic (congestive) heart failure: Secondary | ICD-10-CM | POA: Diagnosis not present

## 2020-05-23 DIAGNOSIS — R7301 Impaired fasting glucose: Secondary | ICD-10-CM | POA: Diagnosis not present

## 2020-05-23 DIAGNOSIS — I130 Hypertensive heart and chronic kidney disease with heart failure and stage 1 through stage 4 chronic kidney disease, or unspecified chronic kidney disease: Secondary | ICD-10-CM | POA: Diagnosis not present

## 2020-05-28 DIAGNOSIS — R7301 Impaired fasting glucose: Secondary | ICD-10-CM | POA: Diagnosis not present

## 2020-05-28 DIAGNOSIS — I5032 Chronic diastolic (congestive) heart failure: Secondary | ICD-10-CM | POA: Diagnosis not present

## 2020-05-28 DIAGNOSIS — I480 Paroxysmal atrial fibrillation: Secondary | ICD-10-CM | POA: Diagnosis not present

## 2020-05-28 DIAGNOSIS — I130 Hypertensive heart and chronic kidney disease with heart failure and stage 1 through stage 4 chronic kidney disease, or unspecified chronic kidney disease: Secondary | ICD-10-CM | POA: Diagnosis not present

## 2020-05-28 DIAGNOSIS — M8000XD Age-related osteoporosis with current pathological fracture, unspecified site, subsequent encounter for fracture with routine healing: Secondary | ICD-10-CM | POA: Diagnosis not present

## 2020-05-28 DIAGNOSIS — I110 Hypertensive heart disease with heart failure: Secondary | ICD-10-CM | POA: Diagnosis not present

## 2020-05-28 DIAGNOSIS — I10 Essential (primary) hypertension: Secondary | ICD-10-CM | POA: Diagnosis not present

## 2020-05-28 DIAGNOSIS — E039 Hypothyroidism, unspecified: Secondary | ICD-10-CM | POA: Diagnosis not present

## 2020-05-28 DIAGNOSIS — J449 Chronic obstructive pulmonary disease, unspecified: Secondary | ICD-10-CM | POA: Diagnosis not present

## 2020-06-05 DIAGNOSIS — H10411 Chronic giant papillary conjunctivitis, right eye: Secondary | ICD-10-CM | POA: Diagnosis not present

## 2020-06-11 DIAGNOSIS — U071 COVID-19: Secondary | ICD-10-CM | POA: Diagnosis not present

## 2020-06-11 DIAGNOSIS — J449 Chronic obstructive pulmonary disease, unspecified: Secondary | ICD-10-CM | POA: Diagnosis not present

## 2020-06-12 ENCOUNTER — Other Ambulatory Visit (INDEPENDENT_AMBULATORY_CARE_PROVIDER_SITE_OTHER): Payer: Self-pay | Admitting: Otolaryngology

## 2020-06-27 DIAGNOSIS — J449 Chronic obstructive pulmonary disease, unspecified: Secondary | ICD-10-CM | POA: Diagnosis not present

## 2020-06-28 DIAGNOSIS — M810 Age-related osteoporosis without current pathological fracture: Secondary | ICD-10-CM | POA: Diagnosis not present

## 2020-06-28 DIAGNOSIS — I110 Hypertensive heart disease with heart failure: Secondary | ICD-10-CM | POA: Diagnosis not present

## 2020-06-28 DIAGNOSIS — I5032 Chronic diastolic (congestive) heart failure: Secondary | ICD-10-CM | POA: Diagnosis not present

## 2020-07-12 DIAGNOSIS — U071 COVID-19: Secondary | ICD-10-CM | POA: Diagnosis not present

## 2020-07-12 DIAGNOSIS — J449 Chronic obstructive pulmonary disease, unspecified: Secondary | ICD-10-CM | POA: Diagnosis not present

## 2020-07-24 ENCOUNTER — Other Ambulatory Visit (INDEPENDENT_AMBULATORY_CARE_PROVIDER_SITE_OTHER): Payer: Self-pay | Admitting: Otolaryngology

## 2020-07-25 ENCOUNTER — Ambulatory Visit: Payer: Medicare HMO | Admitting: Emergency Medicine

## 2020-07-28 DIAGNOSIS — I110 Hypertensive heart disease with heart failure: Secondary | ICD-10-CM | POA: Diagnosis not present

## 2020-07-28 DIAGNOSIS — I5032 Chronic diastolic (congestive) heart failure: Secondary | ICD-10-CM | POA: Diagnosis not present

## 2020-07-28 DIAGNOSIS — M810 Age-related osteoporosis without current pathological fracture: Secondary | ICD-10-CM | POA: Diagnosis not present

## 2020-07-29 ENCOUNTER — Inpatient Hospital Stay (HOSPITAL_COMMUNITY): Payer: Medicare HMO

## 2020-07-29 ENCOUNTER — Inpatient Hospital Stay (HOSPITAL_COMMUNITY)
Admission: EM | Admit: 2020-07-29 | Discharge: 2020-08-02 | DRG: 871 | Disposition: A | Discharge status: Home Health | Payer: Medicare HMO | Attending: Internal Medicine | Admitting: Internal Medicine

## 2020-07-29 ENCOUNTER — Emergency Department (HOSPITAL_COMMUNITY): Payer: Medicare HMO

## 2020-07-29 DIAGNOSIS — I712 Thoracic aortic aneurysm, without rupture: Secondary | ICD-10-CM | POA: Diagnosis present

## 2020-07-29 DIAGNOSIS — I5032 Chronic diastolic (congestive) heart failure: Secondary | ICD-10-CM | POA: Diagnosis not present

## 2020-07-29 DIAGNOSIS — J189 Pneumonia, unspecified organism: Secondary | ICD-10-CM | POA: Diagnosis not present

## 2020-07-29 DIAGNOSIS — J9601 Acute respiratory failure with hypoxia: Secondary | ICD-10-CM | POA: Diagnosis not present

## 2020-07-29 DIAGNOSIS — R509 Fever, unspecified: Secondary | ICD-10-CM

## 2020-07-29 DIAGNOSIS — M810 Age-related osteoporosis without current pathological fracture: Secondary | ICD-10-CM | POA: Diagnosis present

## 2020-07-29 DIAGNOSIS — R6520 Severe sepsis without septic shock: Secondary | ICD-10-CM | POA: Diagnosis not present

## 2020-07-29 DIAGNOSIS — I480 Paroxysmal atrial fibrillation: Secondary | ICD-10-CM | POA: Diagnosis not present

## 2020-07-29 DIAGNOSIS — R6510 Systemic inflammatory response syndrome (SIRS) of non-infectious origin without acute organ dysfunction: Secondary | ICD-10-CM | POA: Diagnosis present

## 2020-07-29 DIAGNOSIS — Z8781 Personal history of (healed) traumatic fracture: Secondary | ICD-10-CM

## 2020-07-29 DIAGNOSIS — R778 Other specified abnormalities of plasma proteins: Secondary | ICD-10-CM

## 2020-07-29 DIAGNOSIS — R911 Solitary pulmonary nodule: Secondary | ICD-10-CM | POA: Diagnosis not present

## 2020-07-29 DIAGNOSIS — E039 Hypothyroidism, unspecified: Secondary | ICD-10-CM | POA: Diagnosis not present

## 2020-07-29 DIAGNOSIS — I491 Atrial premature depolarization: Secondary | ICD-10-CM | POA: Diagnosis not present

## 2020-07-29 DIAGNOSIS — Z8052 Family history of malignant neoplasm of bladder: Secondary | ICD-10-CM

## 2020-07-29 DIAGNOSIS — R7989 Other specified abnormal findings of blood chemistry: Secondary | ICD-10-CM | POA: Diagnosis present

## 2020-07-29 DIAGNOSIS — J440 Chronic obstructive pulmonary disease with acute lower respiratory infection: Secondary | ICD-10-CM | POA: Diagnosis present

## 2020-07-29 DIAGNOSIS — J986 Disorders of diaphragm: Secondary | ICD-10-CM | POA: Diagnosis not present

## 2020-07-29 DIAGNOSIS — K219 Gastro-esophageal reflux disease without esophagitis: Secondary | ICD-10-CM | POA: Diagnosis present

## 2020-07-29 DIAGNOSIS — I351 Nonrheumatic aortic (valve) insufficiency: Secondary | ICD-10-CM | POA: Diagnosis present

## 2020-07-29 DIAGNOSIS — Z7901 Long term (current) use of anticoagulants: Secondary | ICD-10-CM

## 2020-07-29 DIAGNOSIS — Z9109 Other allergy status, other than to drugs and biological substances: Secondary | ICD-10-CM

## 2020-07-29 DIAGNOSIS — Z9981 Dependence on supplemental oxygen: Secondary | ICD-10-CM | POA: Diagnosis not present

## 2020-07-29 DIAGNOSIS — L89101 Pressure ulcer of unspecified part of back, stage 1: Secondary | ICD-10-CM | POA: Diagnosis present

## 2020-07-29 DIAGNOSIS — Z888 Allergy status to other drugs, medicaments and biological substances status: Secondary | ICD-10-CM

## 2020-07-29 DIAGNOSIS — Z7952 Long term (current) use of systemic steroids: Secondary | ICD-10-CM

## 2020-07-29 DIAGNOSIS — I714 Abdominal aortic aneurysm, without rupture: Secondary | ICD-10-CM | POA: Diagnosis present

## 2020-07-29 DIAGNOSIS — I4891 Unspecified atrial fibrillation: Secondary | ICD-10-CM | POA: Diagnosis present

## 2020-07-29 DIAGNOSIS — J439 Emphysema, unspecified: Secondary | ICD-10-CM | POA: Diagnosis not present

## 2020-07-29 DIAGNOSIS — I110 Hypertensive heart disease with heart failure: Secondary | ICD-10-CM | POA: Diagnosis present

## 2020-07-29 DIAGNOSIS — Z66 Do not resuscitate: Secondary | ICD-10-CM | POA: Diagnosis present

## 2020-07-29 DIAGNOSIS — I248 Other forms of acute ischemic heart disease: Secondary | ICD-10-CM | POA: Diagnosis not present

## 2020-07-29 DIAGNOSIS — Z833 Family history of diabetes mellitus: Secondary | ICD-10-CM

## 2020-07-29 DIAGNOSIS — J9621 Acute and chronic respiratory failure with hypoxia: Secondary | ICD-10-CM | POA: Diagnosis not present

## 2020-07-29 DIAGNOSIS — A419 Sepsis, unspecified organism: Secondary | ICD-10-CM | POA: Diagnosis not present

## 2020-07-29 DIAGNOSIS — M1990 Unspecified osteoarthritis, unspecified site: Secondary | ICD-10-CM | POA: Diagnosis present

## 2020-07-29 DIAGNOSIS — J441 Chronic obstructive pulmonary disease with (acute) exacerbation: Secondary | ICD-10-CM | POA: Diagnosis present

## 2020-07-29 DIAGNOSIS — Z79890 Hormone replacement therapy (postmenopausal): Secondary | ICD-10-CM

## 2020-07-29 DIAGNOSIS — R079 Chest pain, unspecified: Secondary | ICD-10-CM | POA: Diagnosis not present

## 2020-07-29 DIAGNOSIS — S2242XS Multiple fractures of ribs, left side, sequela: Secondary | ICD-10-CM

## 2020-07-29 DIAGNOSIS — J9811 Atelectasis: Secondary | ICD-10-CM | POA: Diagnosis not present

## 2020-07-29 DIAGNOSIS — Z87891 Personal history of nicotine dependence: Secondary | ICD-10-CM

## 2020-07-29 DIAGNOSIS — J80 Acute respiratory distress syndrome: Secondary | ICD-10-CM | POA: Diagnosis not present

## 2020-07-29 DIAGNOSIS — Z79899 Other long term (current) drug therapy: Secondary | ICD-10-CM

## 2020-07-29 DIAGNOSIS — Z20822 Contact with and (suspected) exposure to covid-19: Secondary | ICD-10-CM | POA: Diagnosis not present

## 2020-07-29 DIAGNOSIS — R0789 Other chest pain: Secondary | ICD-10-CM | POA: Diagnosis not present

## 2020-07-29 DIAGNOSIS — Z7951 Long term (current) use of inhaled steroids: Secondary | ICD-10-CM

## 2020-07-29 DIAGNOSIS — F419 Anxiety disorder, unspecified: Secondary | ICD-10-CM | POA: Diagnosis present

## 2020-07-29 DIAGNOSIS — S2242XA Multiple fractures of ribs, left side, initial encounter for closed fracture: Secondary | ICD-10-CM | POA: Diagnosis not present

## 2020-07-29 DIAGNOSIS — Z823 Family history of stroke: Secondary | ICD-10-CM

## 2020-07-29 DIAGNOSIS — R000 Tachycardia, unspecified: Secondary | ICD-10-CM | POA: Diagnosis not present

## 2020-07-29 DIAGNOSIS — R0602 Shortness of breath: Secondary | ICD-10-CM | POA: Diagnosis not present

## 2020-07-29 DIAGNOSIS — G629 Polyneuropathy, unspecified: Secondary | ICD-10-CM | POA: Diagnosis present

## 2020-07-29 LAB — BLOOD GAS, VENOUS
Acid-Base Excess: 7.6 mmol/L — ABNORMAL HIGH (ref 0.0–2.0)
Bicarbonate: 32.8 mmol/L — ABNORMAL HIGH (ref 20.0–28.0)
Drawn by: 164
FIO2: 36
O2 Saturation: 74.6 %
Patient temperature: 37
pCO2, Ven: 57.4 mmHg (ref 44.0–60.0)
pH, Ven: 7.375 (ref 7.250–7.430)
pO2, Ven: 40.6 mmHg (ref 32.0–45.0)

## 2020-07-29 LAB — CBC WITH DIFFERENTIAL/PLATELET
Abs Immature Granulocytes: 0.23 10*3/uL — ABNORMAL HIGH (ref 0.00–0.07)
Basophils Absolute: 0.1 10*3/uL (ref 0.0–0.1)
Basophils Relative: 0 %
Eosinophils Absolute: 0 10*3/uL (ref 0.0–0.5)
Eosinophils Relative: 0 %
HCT: 41.6 % (ref 36.0–46.0)
Hemoglobin: 13.8 g/dL (ref 12.0–15.0)
Immature Granulocytes: 1 %
Lymphocytes Relative: 7 %
Lymphs Abs: 1.7 10*3/uL (ref 0.7–4.0)
MCH: 32.2 pg (ref 26.0–34.0)
MCHC: 33.2 g/dL (ref 30.0–36.0)
MCV: 97 fL (ref 80.0–100.0)
Monocytes Absolute: 1.3 10*3/uL — ABNORMAL HIGH (ref 0.1–1.0)
Monocytes Relative: 5 %
Neutro Abs: 20.9 10*3/uL — ABNORMAL HIGH (ref 1.7–7.7)
Neutrophils Relative %: 87 %
Platelets: 264 10*3/uL (ref 150–400)
RBC: 4.29 MIL/uL (ref 3.87–5.11)
RDW: 14.4 % (ref 11.5–15.5)
WBC: 24.2 10*3/uL — ABNORMAL HIGH (ref 4.0–10.5)
nRBC: 0 % (ref 0.0–0.2)

## 2020-07-29 LAB — COMPREHENSIVE METABOLIC PANEL
ALT: 20 U/L (ref 0–44)
AST: 27 U/L (ref 15–41)
Albumin: 3.4 g/dL — ABNORMAL LOW (ref 3.5–5.0)
Alkaline Phosphatase: 61 U/L (ref 38–126)
Anion gap: 8 (ref 5–15)
BUN: 20 mg/dL (ref 8–23)
CO2: 33 mmol/L — ABNORMAL HIGH (ref 22–32)
Calcium: 8.8 mg/dL — ABNORMAL LOW (ref 8.9–10.3)
Chloride: 93 mmol/L — ABNORMAL LOW (ref 98–111)
Creatinine, Ser: 0.69 mg/dL (ref 0.44–1.00)
GFR, Estimated: >60 mL/min (ref >60)
Glucose, Bld: 152 mg/dL — ABNORMAL HIGH (ref 70–99)
Potassium: 3.6 mmol/L (ref 3.5–5.1)
Sodium: 134 mmol/L — ABNORMAL LOW (ref 135–145)
Total Bilirubin: 1.9 mg/dL — ABNORMAL HIGH (ref 0.3–1.2)
Total Protein: 5.8 g/dL — ABNORMAL LOW (ref 6.5–8.1)

## 2020-07-29 LAB — HIV ANTIBODY (ROUTINE TESTING W REFLEX): HIV Screen 4th Generation wRfx: NONREACTIVE

## 2020-07-29 LAB — RESP PANEL BY RT-PCR (FLU A&B, COVID) ARPGX2
Influenza A by PCR: NEGATIVE
Influenza B by PCR: NEGATIVE
SARS Coronavirus 2 by RT PCR: NEGATIVE

## 2020-07-29 LAB — LACTIC ACID, PLASMA
Lactic Acid, Venous: 1 mmol/L (ref 0.5–1.9)
Lactic Acid, Venous: 1 mmol/L (ref 0.5–1.9)

## 2020-07-29 LAB — TROPONIN I (HIGH SENSITIVITY)
Troponin I (High Sensitivity): 23 ng/L — ABNORMAL HIGH (ref <18)
Troponin I (High Sensitivity): 21 ng/L — ABNORMAL HIGH (ref <18)

## 2020-07-29 LAB — BRAIN NATRIURETIC PEPTIDE: B Natriuretic Peptide: 79 pg/mL (ref 0.0–100.0)

## 2020-07-29 LAB — PROCALCITONIN: Procalcitonin: 39.77 ng/mL

## 2020-07-29 MED ORDER — MAGNESIUM OXIDE 400 MG PO TABS
400.0000 mg | ORAL_TABLET | Freq: Every day | ORAL | Status: DC
  Filled 2020-07-29: qty 1

## 2020-07-29 MED ORDER — ENSURE ENLIVE PO LIQD
237.0000 mL | Freq: Two times a day (BID) | ORAL | Status: DC
  Administered 2020-08-01 – 2020-08-02 (×3): 237 mL via ORAL

## 2020-07-29 MED ORDER — ALBUTEROL SULFATE (2.5 MG/3ML) 0.083% IN NEBU: 2.5000 mg | INHALATION_SOLUTION | RESPIRATORY_TRACT | Status: DC | PRN

## 2020-07-29 MED ORDER — LOSARTAN POTASSIUM 50 MG PO TABS
25.0000 mg | ORAL_TABLET | Freq: Every day | ORAL | Status: DC
  Administered 2020-07-29 – 2020-07-31 (×3): 25 mg via ORAL
  Filled 2020-07-29 (×3): qty 1

## 2020-07-29 MED ORDER — SODIUM CHLORIDE 0.9 % IV SOLN
2.0000 g | INTRAVENOUS | Status: DC
  Administered 2020-07-29 – 2020-08-01 (×4): 2 g via INTRAVENOUS
  Filled 2020-07-29: qty 2
  Filled 2020-07-29 (×2): qty 20
  Filled 2020-07-29: qty 2
  Filled 2020-07-29: qty 20

## 2020-07-29 MED ORDER — ALBUTEROL SULFATE (2.5 MG/3ML) 0.083% IN NEBU
2.5000 mg | INHALATION_SOLUTION | Freq: Two times a day (BID) | RESPIRATORY_TRACT | Status: DC
  Administered 2020-07-30 – 2020-07-31 (×3): 2.5 mg via RESPIRATORY_TRACT
  Filled 2020-07-29 (×4): qty 3

## 2020-07-29 MED ORDER — METHYLPREDNISOLONE SODIUM SUCC 125 MG IJ SOLR
60.0000 mg | Freq: Three times a day (TID) | INTRAMUSCULAR | Status: AC
  Administered 2020-07-29 – 2020-07-30 (×3): 60 mg via INTRAVENOUS
  Filled 2020-07-29 (×3): qty 2

## 2020-07-29 MED ORDER — ONDANSETRON HCL 4 MG PO TABS: 4.0000 mg | ORAL_TABLET | Freq: Four times a day (QID) | ORAL | Status: DC | PRN

## 2020-07-29 MED ORDER — LIDOCAINE 5 % EX PTCH
1.0000 | MEDICATED_PATCH | CUTANEOUS | Status: DC
  Administered 2020-07-29: 1 via TRANSDERMAL
  Filled 2020-07-29 (×2): qty 1

## 2020-07-29 MED ORDER — POLYVINYL ALCOHOL 1.4 % OP SOLN
1.0000 [drp] | OPHTHALMIC | Status: DC | PRN
  Filled 2020-07-29: qty 15

## 2020-07-29 MED ORDER — BIOGAIA PROBIOTIC PO LIQD
5.0000 [drp] | Freq: Every day | ORAL | Status: DC
  Filled 2020-07-29 (×2): qty 5

## 2020-07-29 MED ORDER — ARFORMOTEROL TARTRATE 15 MCG/2ML IN NEBU
15.0000 ug | INHALATION_SOLUTION | Freq: Two times a day (BID) | RESPIRATORY_TRACT | Status: DC
  Administered 2020-07-29 – 2020-08-02 (×8): 15 ug via RESPIRATORY_TRACT
  Filled 2020-07-29 (×8): qty 2

## 2020-07-29 MED ORDER — DILTIAZEM HCL ER COATED BEADS 180 MG PO CP24
180.0000 mg | ORAL_CAPSULE | Freq: Every day | ORAL | Status: DC
  Administered 2020-07-29 – 2020-08-02 (×5): 180 mg via ORAL
  Filled 2020-07-29 (×5): qty 1

## 2020-07-29 MED ORDER — MELATONIN 5 MG PO TABS: 10.0000 mg | ORAL_TABLET | Freq: Every evening | ORAL | Status: DC | PRN

## 2020-07-29 MED ORDER — GUAIFENESIN-DM 100-10 MG/5ML PO SYRP: 10.0000 mL | ORAL_SOLUTION | Freq: Four times a day (QID) | ORAL | Status: DC | PRN

## 2020-07-29 MED ORDER — BUDESONIDE 0.5 MG/2ML IN SUSP
0.5000 mg | Freq: Two times a day (BID) | RESPIRATORY_TRACT | Status: DC
  Administered 2020-07-29 – 2020-08-02 (×8): 0.5 mg via RESPIRATORY_TRACT
  Filled 2020-07-29 (×8): qty 2

## 2020-07-29 MED ORDER — ACETAMINOPHEN 325 MG PO TABS: 650.0000 mg | ORAL_TABLET | Freq: Four times a day (QID) | ORAL | Status: DC | PRN

## 2020-07-29 MED ORDER — POLYETHYLENE GLYCOL 3350 17 G PO PACK
17.0000 g | PACK | Freq: Every day | ORAL | Status: DC
  Administered 2020-07-30 – 2020-08-02 (×3): 17 g via ORAL
  Filled 2020-07-29 (×3): qty 1

## 2020-07-29 MED ORDER — FUROSEMIDE 20 MG PO TABS
40.0000 mg | ORAL_TABLET | Freq: Every day | ORAL | Status: DC
  Administered 2020-07-30 – 2020-08-02 (×4): 40 mg via ORAL
  Filled 2020-07-29 (×5): qty 2

## 2020-07-29 MED ORDER — ALBUTEROL SULFATE (2.5 MG/3ML) 0.083% IN NEBU
2.5000 mg | INHALATION_SOLUTION | Freq: Four times a day (QID) | RESPIRATORY_TRACT | Status: DC
  Administered 2020-07-29 (×2): 2.5 mg via RESPIRATORY_TRACT
  Filled 2020-07-29 (×2): qty 3

## 2020-07-29 MED ORDER — HYDROCODONE-ACETAMINOPHEN 5-325 MG PO TABS
1.0000 | ORAL_TABLET | Freq: Four times a day (QID) | ORAL | Status: DC | PRN
  Administered 2020-07-29: 1 via ORAL
  Filled 2020-07-29: qty 1

## 2020-07-29 MED ORDER — ACETAMINOPHEN 650 MG RE SUPP: 650.0000 mg | Freq: Four times a day (QID) | RECTAL | Status: DC | PRN

## 2020-07-29 MED ORDER — PIPERACILLIN-TAZOBACTAM 3.375 G IVPB
3.3750 g | Freq: Once | INTRAVENOUS | Status: AC
  Administered 2020-07-29: 3.375 g via INTRAVENOUS
  Filled 2020-07-29: qty 50

## 2020-07-29 MED ORDER — SODIUM CHLORIDE 0.9 % IV BOLUS
1000.0000 mL | Freq: Once | INTRAVENOUS | Status: AC
  Administered 2020-07-29: 1000 mL via INTRAVENOUS

## 2020-07-29 MED ORDER — SODIUM CHLORIDE 0.9 % IV SOLN
500.0000 mg | INTRAVENOUS | Status: DC
  Administered 2020-07-29 – 2020-07-31 (×3): 500 mg via INTRAVENOUS
  Filled 2020-07-29 (×4): qty 500

## 2020-07-29 MED ORDER — VANCOMYCIN HCL 1000 MG/200ML IV SOLN
1000.0000 mg | Freq: Once | INTRAVENOUS | Status: AC
  Administered 2020-07-29: 1000 mg via INTRAVENOUS
  Filled 2020-07-29: qty 200

## 2020-07-29 MED ORDER — LEVOTHYROXINE SODIUM 25 MCG PO TABS
137.0000 ug | ORAL_TABLET | Freq: Every day | ORAL | Status: DC
  Administered 2020-07-30 – 2020-08-02 (×4): 137 ug via ORAL
  Filled 2020-07-29 (×4): qty 1

## 2020-07-29 MED ORDER — ONDANSETRON HCL 4 MG/2ML IJ SOLN: 4.0000 mg | Freq: Four times a day (QID) | INTRAMUSCULAR | Status: DC | PRN

## 2020-07-29 MED ORDER — PREDNISONE 20 MG PO TABS
40.0000 mg | ORAL_TABLET | Freq: Every day | ORAL | Status: AC
  Administered 2020-07-30: 40 mg via ORAL
  Filled 2020-07-29: qty 2

## 2020-07-29 MED ORDER — TIOTROPIUM BROMIDE MONOHYDRATE 2.5 MCG/ACT IN AERS: 2.0000 | INHALATION_SPRAY | Freq: Every day | RESPIRATORY_TRACT | Status: DC

## 2020-07-29 MED ORDER — APIXABAN 2.5 MG PO TABS
2.5000 mg | ORAL_TABLET | Freq: Two times a day (BID) | ORAL | Status: DC
  Administered 2020-07-29 – 2020-08-02 (×9): 2.5 mg via ORAL
  Filled 2020-07-29 (×9): qty 1

## 2020-07-29 MED ORDER — CARBOXYMETHYLCELLULOSE SODIUM 0.25 % OP SOLN: Freq: Every day | OPHTHALMIC | Status: DC

## 2020-07-29 MED ORDER — FLUTICASONE PROPIONATE 50 MCG/ACT NA SUSP
2.0000 | Freq: Every evening | NASAL | Status: DC
  Administered 2020-07-29 – 2020-08-01 (×3): 2 via NASAL
  Filled 2020-07-29 (×2): qty 16

## 2020-07-29 NOTE — H&P (Signed)
History and Physical    Heather [name]:[id number] DOB: [birth year] DOA: 07/29/2020  Referring MD/NP/PA: Veryl Speak, MD PCP: Burnard Bunting, MD  Patient coming from: home via EMS  Chief Complaint: Chest pain and shortness of  I have personally briefly reviewed patient's old medical records in [place]   HPI: Heather [name] is a 87 y.o. female with medical history significant of atrial fibrillation on Eliquis, COPD, chronic respiratory failure, osteoporosis, hypothyroidism, and AAA who presents with complaints of left-sided chest pain and shortness of breath.  History is obtained from the patient's daughter who is present at bedside.  She complained of lateral chest wall discomfort 2 days ago, but was mild at that time.  Denies having any falls, trauma, or injury to her knowledge to provoke symptoms.  Pain seem to be worsened with any kind of movement, deep breathing, or palpation of the area.  Noted associated symptoms worsening shortness of breath, chronically has lower extremity swelling is reported to be unchanged with redness for which wears compression stockings, and loose stools(on 4/29 and reported usually occurs once a month).  Normally she is on 2 to 3 L just during the day and does not require nasal cannula oxygen at night.  Her daughter had tried to breathing treatments during the day without any significant change in her symptoms.  Denies any recent fever, sick contacts, cough, nausea, vomiting, or dysuria.  She has a prior history of rib and thoracic compression fractures.  Patient daughter reports that she has been on Eliquis and has not missed any doses.  In route with EMS patient O2 sats were 91% on home 3 L and she was placed on a nonrebreather.  ED Course: Upon admission into the emergency department patient was seen to have a temperature of 100.3 F, pulse 10 5-1 14, respiration 24-32, blood pressures maintained, and O2 saturations 89 -100% on 4 L nasal cannula  oxygen.  Labs significant for WBC 24.2 with left shift, sodium 134, chloride 93, CO2 33, BUN 20, creatinine 0.69, total bilirubin 1.9, BNP 79, troponin 23, and lactic acid 1.  Lateral rib series noting chronic lung disease with emphysema with chronic appearing left rib fractures and thoracic fractures.  COVID-19 and influenza screening were both negative.  Blood cultures have been obtained.  Patient was given 1 L normal saline IV fluids, vancomycin, and Zosyn.  Review of Systems  Constitutional: Positive for malaise/fatigue. Negative for fever.  HENT: Positive for hearing loss. Negative for sinus pain.   Eyes: Negative for pain.  Respiratory: Positive for shortness of breath.   Cardiovascular: Positive for chest pain and leg swelling.  Gastrointestinal: Negative for nausea and vomiting.  Genitourinary: Negative for dysuria and hematuria.  Musculoskeletal: Positive for joint pain. Negative for falls.  Skin:       Redness of lower extremities reported to be chronic  Neurological: Negative for focal weakness and loss of consciousness.  Psychiatric/Behavioral: The patient has insomnia.     Past Medical History:  Diagnosis Date  . Anxiety   . Arthritis   . Atrial fibrillation ([place])   . Cerumen impaction   . COPD (chronic obstructive pulmonary disease) ([place])   . Dysrhythmia    AF  . Essential hypertension, benign   . Gallstones   . GERD (gastroesophageal reflux disease)   . H/O hiatal hernia   . History of nuclear stress test 10/2010   dipyridamole; normal pattern of perfusion; low risk, normal study  . 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 Mild aortic insufficiency   . Osteoporosis   . Peripheral neuropathy   . Pneumonia    states she had it twice, last time a couple of months ago  . PONV (postoperative nausea and vomiting)   . Shortness of breath    with exertion  . Unspecified hypothyroidism   . Venous insufficiency     Past Surgical  History:  Procedure Laterality Date  . APPENDECTOMY  1935  . BACK SURGERY     x2  . Cardiometablic Testing  3/55/7322   submaximal effort with peak RER of 0.5, peak VO2 79% predicted; HR peak up to 78%; PVC was 55% predicted, PEV1 39% predicted; PEV1/VC ratio was reduced; normal vital capacity; DLCO was reduced to 63%  . CARPAL TUNNEL RELEASE    . CATARACT EXTRACTION W/ INTRAOCULAR LENS  IMPLANT, BILATERAL    . CHOLECYSTECTOMY  04/07/2014   dr toth  . CHOLECYSTECTOMY N/A 04/07/2014   Procedure: LAPAROSCOPIC CHOLECYSTECTOMY WITH INTRAOPERATIVE CHOLANGIOGRAM POSSBILE OPEN;  Surgeon: Autumn Messing III, MD;  Location: [facility];  Service: General;  Laterality: N/A;  . COLON SURGERY  2008   partial  . ESOPHAGOGASTRODUODENOSCOPY (EGD) WITH PROPOFOL N/A 05/25/2017   Procedure: ESOPHAGOGASTRODUODENOSCOPY (EGD) WITH PROPOFOL;  Surgeon: Clarene Essex, MD;  Location: [facility];  Service: Endoscopy;  Laterality: N/A;  . HERNIA REPAIR    . JOINT REPLACEMENT    . left hip relacement  2000  . SAVORY DILATION N/A 05/25/2017   Procedure: SAVORY DILATION;  Surgeon: Clarene Essex, MD;  Location: [facility] ENDOSCOPY;  Service: Endoscopy;  Laterality: N/A;  . TONSILLECTOMY  1935  . TOTAL ABDOMINAL HYSTERECTOMY  1970  . TRANSTHORACIC ECHOCARDIOGRAM  05/2011   EF=>55%; mild conc LVH; mild mitral annular calcif; mild TR; AV mildly sclerotic & mild AR  . VENTRAL HERNIA REPAIR  09/19/2011   Procedure: LAPAROSCOPIC VENTRAL HERNIA;  Surgeon: Merrie Roof, MD;  Location: [place];  Service: General;  Laterality: N/A;  laparoscopic ventral hernia repair with mesh     reports that she quit smoking about 41 years ago. Her smoking use included cigarettes. She started smoking about 62 years ago. She has a 20.00 pack-year smoking history. She has never used smokeless tobacco. She reports previous alcohol use. She reports that she does not use drugs.  Allergies  Allergen Reactions  . Ace Inhibitors Cough  . Elemental Sulfur Nausea And  Vomiting    Family History  Problem Relation Age of Onset  . Stroke Mother   . Stroke Father   . Heart block Brother   . Bladder Cancer Brother   . Diabetes Brother   . Stroke Brother     Prior to Admission medications   Medication Sig Start Date End Date Taking? Authorizing Provider  albuterol (PROVENTIL) (2.5 MG/3ML) 0.083% nebulizer solution Take 2.5 mg by nebulization 2 (two) times daily as needed for wheezing or shortness of breath.   Yes [provider]  albuterol (VENTOLIN HFA) 108 (90 Base) MCG/ACT inhaler Inhale 2 puffs into the lungs every 4 (four) hours as needed for wheezing or shortness of breath. 03/05/20  Yes Collene Gobble, MD  apixaban (ELIQUIS) 2.5 MG TABS tablet Take 1 tablet (2.5 mg total) by mouth 2 (two) times daily. 04/12/20  Yes Hilty, Nadean Corwin, MD  Budeson-Glycopyrrol-Formoterol (BREZTRI AEROSPHERE) 160-9-4.8 MCG/ACT AERO Inhale 2 puffs into the lungs in the morning and at bedtime. 05/21/20  Yes Collene Gobble, MD  calcium citrate-vitamin D (  CITRACAL+D) 315-200 MG-UNIT per tablet Take 1 tablet by mouth daily.   Yes [provider]  Carboxymethylcellulose Sodium (THERATEARS OP) Place 2 drops into both eyes daily.    Yes [provider]  cholecalciferol (VITAMIN D3) 25 MCG (1000 UNIT) tablet Take 1,000 Units by mouth daily.   Yes [provider]  diltiazem (CARDIZEM CD) 180 MG 24 hr capsule Take 180 mg by mouth daily.    Yes [provider]  feeding supplement, ENSURE ENLIVE, (ENSURE ENLIVE) LIQD Take 237 mLs by mouth 2 (two) times daily between meals. 10/21/19  Yes Shah, Pratik D, DO  fluticasone (FLONASE) 50 MCG/ACT nasal spray USE 2 SPRAY(S) IN EACH NOSTRIL ONCE DAILY IN THE EVENING Patient taking differently: Place 2 sprays into both nostrils every evening. 07/24/20  Yes Rozetta Nunnery, MD  furosemide (LASIX) 40 MG tablet Take 40 mg by mouth daily.  11/22/19  Yes [provider]   guaiFENesin-dextromethorphan (ROBITUSSIN DM) 100-10 MG/5ML syrup Take 5 mLs by mouth every 6 (six) hours as needed for cough. Patient taking differently: Take 10 mLs by mouth every 6 (six) hours as needed for cough. 01/11/19  Yes Arrien, Jimmy Picket, MD  KLOR-CON M20 20 MEQ tablet Take 20 mEq by mouth daily.  12/24/19  Yes [provider]  Lactobacillus Reuteri (BIOGAIA PROBIOTIC PO) Take 1 capsule by mouth daily.   Yes [provider]  levothyroxine (SYNTHROID, LEVOTHROID) 137 MCG tablet Take 137 mcg by mouth daily before breakfast.    Yes [provider]  losartan (COZAAR) 25 MG tablet Take 25 mg by mouth daily.  12/28/19  Yes [provider]  magnesium oxide (MAG-OX) 400 MG tablet Take 400 mg by mouth daily.   Yes [provider]  Melatonin 10 MG CAPS Take 10 mg by mouth at bedtime as needed (sleep).   Yes [provider]  Multiple Vitamins-Minerals (MULTIVITAMIN WITH MINERALS) tablet Take 1 tablet by mouth daily.   Yes [provider]  polyethylene glycol (MIRALAX / GLYCOLAX) packet Take 17 g by mouth daily.   Yes [provider]  predniSONE (DELTASONE) 10 MG tablet Take 1 tablet (10 mg total) by mouth daily with breakfast. 03/14/20  Yes Magdalen Spatz, NP  budesonide-formoterol (SYMBICORT) 160-4.5 MCG/ACT inhaler Inhale 1 puff into the lungs 2 (two) times daily. Patient not taking: Reported on 07/29/2020 02/02/20   Collene Gobble, MD  Teriparatide, Recombinant, (FORTEO) 620 MCG/2.48ML SOPN Inject 20 mcg into the skin daily before breakfast.  Patient not taking: Reported on 07/29/2020    [provider]  Tiotropium Bromide Monohydrate (SPIRIVA RESPIMAT) 2.5 MCG/ACT AERS Inhale 2 puffs into the lungs daily. Patient not taking: No sig reported 04/12/20   [name], NP    Physical Exam:  Constitutional: NAD, calm, comfortable Vitals:   07/29/20 0529 07/29/20 0530 07/29/20 0544 07/29/20 0657  BP:  107/61   116/63  Pulse: (!) 114 (!) 113 (!) 113 (!) 105  Resp: (!) 32 (!) 30 (!) 24 (!) 31  Temp: 100.3 F (37.9 C)     TempSrc: Oral     SpO2: 100% 100% 100% 96%  Weight:  47.6 kg    Height:  5\' 3"  (1.6 m)     Eyes: PERRL, lids and conjunctivae normal ENMT: Mucous membranes are moist. Posterior pharynx clear of any exudate or lesions.Normal dentition.  Neck: normal, supple, no masses, no thyromegaly Respiratory: clear to auscultation bilaterally, no wheezing, no crackles. Normal respiratory effort. No accessory  muscle use.  Cardiovascular: Regular rate and rhythm, no murmurs / rubs / gallops. No extremity edema. 2+ pedal pulses. No carotid bruits.  Abdomen: no tenderness, no masses palpated. No hepatosplenomegaly. Bowel sounds positive.  Musculoskeletal: no clubbing / cyanosis. No joint deformity upper and lower extremities. Good ROM, no contractures. Normal muscle tone.  Skin: no rashes, lesions, ulcers. No induration Neurologic: CN 2-12 grossly intact. Sensation intact, DTR normal. Strength 5/5 in all 4.  Psychiatric: Normal judgment and insight. Alert and oriented x 3. Normal mood.     Labs on Admission: I have personally reviewed following labs and imaging studies  CBC: Recent Labs  Lab 07/29/20 0543  WBC 24.2*  NEUTROABS 20.9*  HGB 13.8  HCT 41.6  MCV 97.0  PLT 680   Basic Metabolic Panel: Recent Labs  Lab 07/29/20 0543  NA 134*  K 3.6  CL 93*  CO2 33*  GLUCOSE 152*  BUN 20  CREATININE 0.69  CALCIUM 8.8*   GFR: Estimated Creatinine Clearance: 34.4 mL/min (by C-G formula based on SCr of 0.69 mg/dL). Liver Function Tests: Recent Labs  Lab 07/29/20 0543  AST 27  ALT 20  ALKPHOS 61  BILITOT 1.9*  PROT 5.8*  ALBUMIN 3.4*   No results for input(s): LIPASE, AMYLASE in the last 168 hours. No results for input(s): AMMONIA in the last 168 hours. Coagulation Profile: No results for input(s): INR, PROTIME in the last 168 hours. Cardiac Enzymes: No results for  input(s): CKTOTAL, CKMB, CKMBINDEX, TROPONINI in the last 168 hours. BNP (last 3 results) No results for input(s): PROBNP in the last 8760 hours. HbA1C: No results for input(s): HGBA1C in the last 72 hours. CBG: No results for input(s): GLUCAP in the last 168 hours. Lipid Profile: No results for input(s): CHOL, HDL, LDLCALC, TRIG, CHOLHDL, LDLDIRECT in the last 72 hours. Thyroid Function Tests: No results for input(s): TSH, T4TOTAL, FREET4, T3FREE, THYROIDAB in the last 72 hours. Anemia Panel: No results for input(s): VITAMINB12, FOLATE, FERRITIN, TIBC, IRON, RETICCTPCT in the last 72 hours. Urine analysis:    Component Value Date/Time   COLORURINE YELLOW 01/25/2020 0250   APPEARANCEUR CLEAR 01/25/2020 0250   LABSPEC 1.018 01/25/2020 0250   PHURINE 6.0 01/25/2020 0250   GLUCOSEU 150 (A) 01/25/2020 0250   HGBUR NEGATIVE 01/25/2020 0250   BILIRUBINUR NEGATIVE 01/25/2020 0250   KETONESUR NEGATIVE 01/25/2020 0250   PROTEINUR NEGATIVE 01/25/2020 0250   UROBILINOGEN 0.2 05/17/2013 1207   NITRITE NEGATIVE 01/25/2020 0250   LEUKOCYTESUR NEGATIVE 01/25/2020 0250   Sepsis Labs: Recent Results (from the past 240 hour(s))  Resp Panel by RT-PCR (Flu A&B, Covid) Nasopharyngeal Swab     Status: None   Collection Time: 07/29/20  5:44 AM   Specimen: Nasopharyngeal Swab; Nasopharyngeal(NP) swabs in vial transport medium  Result Value Ref Range Status   SARS Coronavirus 2 by RT PCR NEGATIVE NEGATIVE Final    Comment: (NOTE) SARS-CoV-2 target nucleic acids are NOT DETECTED.  The SARS-CoV-2 RNA is generally detectable in upper respiratory specimens during the acute phase of infection. The lowest concentration of SARS-CoV-2 viral copies this assay can detect is 138 copies/mL. A negative result does not preclude SARS-Cov-2 infection and should not be used as the sole basis for treatment or other patient management decisions. A negative result may occur with  improper specimen  collection/handling, submission of specimen other than nasopharyngeal swab, presence of viral mutation(s) within the areas targeted by this assay, and inadequate number of viral copies(<138 copies/mL). A negative result  must be combined with clinical observations, patient history, and epidemiological information. The expected result is Negative.  Fact Sheet for Patients:  EntrepreneurPulse.com.au  Fact Sheet for Healthcare Providers:  IncredibleEmployment.be  This test is no t yet approved or cleared by the [place] FDA and  has been authorized for detection and/or diagnosis of SARS-CoV-2 by FDA under an Emergency Use Authorization (EUA). This EUA will remain  in effect (meaning this test can be used) for the duration of the COVID-19 declaration under Section 564(b)(1) of the Act, 21 U.S.C.section 360bbb-3(b)(1), unless the authorization is terminated  or revoked sooner.       Influenza A by PCR NEGATIVE NEGATIVE Final   Influenza B by PCR NEGATIVE NEGATIVE Final    Comment: (NOTE) The Xpert Xpress SARS-CoV-2/FLU/RSV plus assay is intended as an aid in the diagnosis of influenza from Nasopharyngeal swab specimens and should not be used as a sole basis for treatment. Nasal washings and aspirates are unacceptable for Xpert Xpress SARS-CoV-2/FLU/RSV testing.  Fact Sheet for Patients: EntrepreneurPulse.com.au  Fact Sheet for Healthcare Providers: IncredibleEmployment.be  This test is not yet approved or cleared by the [place] FDA and has been authorized for detection and/or diagnosis of SARS-CoV-2 by FDA under an Emergency Use Authorization (EUA). This EUA will remain in effect (meaning this test can be used) for the duration of the COVID-19 declaration under Section 564(b)(1) of the Act, 21 U.S.C. section 360bbb-3(b)(1), unless the authorization is terminated or revoked.  Performed at [facility], [place] [address] [place], [place] [address]      Radiological Exams on Admission: DG Ribs Unilateral W/Chest Left  Result Date: 07/29/2020 CLINICAL DATA:  87 year old female with left chest pain and shortness of breath since yesterday. On home oxygen. EXAM: LEFT RIBS AND CHEST - 3+ VIEW COMPARISON:  Chest CT 10/13/2019 and earlier. FINDINGS: Chronic pulmonary hyperinflation with emphysema demonstrated by CT last year. Chronic eventration or elevation of the right hemidiaphragm has mildly progressed since 2020 but appears stable since last year. Tortuous thoracic aorta with calcified atherosclerosis. Other mediastinal contours are within normal limits. Visualized tracheal air column is within normal limits. No pneumothorax or pulmonary edema. Left lung base appears clear. Right lung base atelectasis. No air bronchograms identified. Three oblique views of the left ribs. Osteopenia. Chronic left lateral 6th through 9th rib fractures. No acute displaced left rib fracture identified. Multilevel chronic thoracic compression fractures. Previous lumbar interbody fusion. Advanced degeneration at the left glenohumeral joint. No acute osseous abnormality is evident. Negative visible bowel gas pattern.  Stable cholecystectomy clips. IMPRESSION: 1. Chronic lung disease with Emphysema (ICD10-J43.9). Eventration/elevation of the right hemidiaphragm with right lung base atelectasis has not significantly changed since last year. No acute cardiopulmonary abnormality identified. 2. Chronic left rib fractures and thoracic compression fractures. No acute rib fracture identified. Electronically Signed   By: Genevie Ann M.D.   On: 07/29/2020 06:40    EKG: Independently reviewed.  Sinus tachycardia at 117 bpm with premature atrial complexes  Assessment/Plan Sepsis 2/2 Pneumonia: Patient found have temperature of 100.3 F, tachycardic, and tachypneic with WBC elevated 24.2.  Chest x-ray did not note anything acute.   Blood cultures have been obtained and she had been started on empiric antibiotics of vancomycin and Zosyn, but no clear source of infection appreciated at this time.  COVID-19 and influenza screening were both negative.  Patient has been given 1 L of normal saline IV fluids and empiric antibiotics of Vancomycin and Zosyn.  Suspecting likely pneumonia as cause of symptoms. -Admit to a medical telemetry bed -Follow-up blood and urine studies/cultures -Check procalcitonin(39.77 for which will continue antibiotic) -Check CT scan of the chest without contrast -Continue empiric antibiotics of Rocephin and azithromycin -Tylenol as needed for fever  Respiratory failure with hypoxia COPD exacerbation: Acute on chronic.  Patient presents with progressively worsening shortness of breath thought possibly secondary to patient's chest which pneumonia.  At baseline patient is on chronic prednisone 10 mg daily, normally on his 2 to 3 L of oxygen at home during the day, and does not require oxygen at night.  However, patient had required oxygen at night due to worsening of her breathing. -Check venous blood gas(PCO2 57.4) -Solu-Medrol 60 mg IV 8 hours x 3, then start prednisone 40 mg -Albuterol nebs 4 times daily and as needed -Brovana and budesonide nebs twice daily -Continue Spiriva and Robitussin  Left chest wall pain history of rib and thoracic fractures: On physical exam patient with tenderness palpation of left chest wall.  Lateral rib series noted chronic appearing left sixth through ninth rib fractures and thoracic fractures. -Lidocaine patch to chest wall  Elevated troponin: Acute.  High-sensitivity troponin mildly elevated at 23.  EKG without any significant ischemic changes.  Suspect possibly secondary to demand. -Continue to monitor cardiac troponin  Paroxysmal atrial fibrillation on chronic anticoagulation: Patient appears to be in sinus rhythm at the time of my exam. -Continue Eliquis and  diltiazem  Essential hypertension: Blood pressures currently stable.  Home blood pressure medications include diltiazem 180 mg daily, furosemide 40 mg daily, and losartan 25 mg daily. -Continue diltiazem and losartan today -Restart furosemide on 5/2  HFpEF: Patient has significant 2+ lower extremity edema, but no significant JVD appreciated.  BNP 79.  Do not suspect her to be grossly overloaded.  Her last echocardiogram revealed EF of 65-70%. -Strict intake and output -Daily weights  Hypothyroidism -Check TSH -Continue levothyroxine  AAA: Patient previously noted to have a 3.9 cm ascending thoracic aortic aneurysm last noted from CT scan of the chest from 04/2019.   Pulmonary nodule: Patient with known right pulmonary nodule has been gradually increasing in size thought to be possibly malignant in nature.  DVT prophylaxis: Eliquis Code Status: DNR Family Communication: Daughter updated at bedside Disposition Plan: Hopefully discharge home once medically stable Consults called: None Admission status: Inpatient, require more than 2 midnight stay  Norval Morton MD [facility]   If 7PM-7AM, please contact night-coverage   07/29/2020, 7:34 AM

## 2020-07-29 NOTE — ED Provider Notes (Signed)
[facility] EMERGENCY DEPARTMENT Provider Note   CSN: [id number] Arrival date & time: 07/29/20  0520     History Chief Complaint  Patient presents with  . Chest Pain  . Shortness of Breath    Heather [name] is a 87 y.o. female.  Patient is a 87 year old female with past medical history of COPD on home oxygen, atrial fibrillation on Eliquis, aortic insufficiency, and hypertension.  Patient brought by EMS for evaluation of chest pain and shortness of breath.  She started 2 days ago with discomfort to the left lateral ribs.  This began in the absence of any injury or trauma.  She has had a rib fracture in the past and this feels similar.  The pain is worse when she breathes, palpates, or moves.  She also describes worsening breathing.  This became worse this evening and EMS was called.  I am told she was at 91% while on her nasal cannula and changed to nonrebreather for transport.  She denies fever at home, but does seem to have low-grade fever here of 100.3.  The history is provided by the patient.  Chest Pain Pain location:  L chest Pain quality: sharp   Pain radiates to:  Does not radiate Pain severity:  Moderate Onset quality:  Sudden Duration:  2 days Timing:  Constant Progression:  Worsening Relieved by:  Nothing Worsened by:  Certain positions, deep breathing and movement (Palpation) Associated symptoms: shortness of breath   Shortness of Breath Associated symptoms: chest pain        Past Medical History:  Diagnosis Date  . Anxiety   . Arthritis   . Atrial fibrillation ([place])   . Cerumen impaction   . COPD (chronic obstructive pulmonary disease) ([place])   . Dysrhythmia    AF  . Essential hypertension, benign   . Gallstones   . GERD (gastroesophageal reflux disease)   . H/O hiatal hernia   . History of nuclear stress test 10/2010   dipyridamole; normal pattern of perfusion; low risk, normal study  . 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 Mild aortic insufficiency   . Osteoporosis   . Peripheral neuropathy   . Pneumonia    states she had it twice, last time a couple of months ago  . PONV (postoperative nausea and vomiting)   . Shortness of breath    with exertion  . Unspecified hypothyroidism   . Venous insufficiency     Patient Active Problem List   Diagnosis Date Noted  . Pressure injury of skin 02/28/2020  . COPD exacerbation ([place]) 02/27/2020  . Chronic respiratory failure with hypoxia ([place]) 02/22/2020  . Acute on chronic respiratory failure with hypoxia ([name]) 10/18/2019  . Pulmonary nodule 03/29/2019  . Pneumonia due to COVID-19 virus 01/06/2019  . Hypotension 01/05/2019  . Protein-calorie malnutrition, severe 04/26/2018  . Skin avulsion 09/19/2016  . Influenza B 04/16/2016  . Sepsis ([place]) 04/16/2016  . Hypokalemia 04/16/2016  . Hypothyroidism 04/16/2016  . Post herpetic neuralgia 06/06/2014  . Shingles outbreak 04/19/2014  . Gallstones 09/20/2013  . COPD (chronic obstructive pulmonary disease) ([place]) 05/17/2013  . Dyspnea on exertion 04/25/2013  . Pulmonary emphysema ([place]) 11/15/2012  . HTN (hypertension) 11/15/2012  . PAF (paroxysmal atrial fibrillation) ([place]) 06/14/2012  . Long term current use of anticoagulant therapy 06/14/2012  . Ventral hernia 07/31/2011  . Abdominal wall mass 07/01/2011    Past Surgical History:  Procedure Laterality Date  . APPENDECTOMY  1935  .  BACK SURGERY     x2  . Cardiometablic Testing  6/96/2952   submaximal effort with peak RER of 0.5, peak VO2 79% predicted; HR peak up to 78%; PVC was 55% predicted, PEV1 39% predicted; PEV1/VC ratio was reduced; normal vital capacity; DLCO was reduced to 63%  . CARPAL TUNNEL RELEASE    . CATARACT EXTRACTION W/ INTRAOCULAR LENS  IMPLANT, BILATERAL    . CHOLECYSTECTOMY  04/07/2014   dr toth  . CHOLECYSTECTOMY N/A 04/07/2014   Procedure: LAPAROSCOPIC CHOLECYSTECTOMY WITH INTRAOPERATIVE CHOLANGIOGRAM  POSSBILE OPEN;  Surgeon: Autumn Messing III, MD;  Location: [facility];  Service: General;  Laterality: N/A;  . COLON SURGERY  2008   partial  . ESOPHAGOGASTRODUODENOSCOPY (EGD) WITH PROPOFOL N/A 05/25/2017   Procedure: ESOPHAGOGASTRODUODENOSCOPY (EGD) WITH PROPOFOL;  Surgeon: Clarene Essex, MD;  Location: [facility];  Service: Endoscopy;  Laterality: N/A;  . HERNIA REPAIR    . JOINT REPLACEMENT    . left hip relacement  2000  . SAVORY DILATION N/A 05/25/2017   Procedure: SAVORY DILATION;  Surgeon: Clarene Essex, MD;  Location: [facility] ENDOSCOPY;  Service: Endoscopy;  Laterality: N/A;  . TONSILLECTOMY  1935  . TOTAL ABDOMINAL HYSTERECTOMY  1970  . TRANSTHORACIC ECHOCARDIOGRAM  05/2011   EF=>55%; mild conc LVH; mild mitral annular calcif; mild TR; AV mildly sclerotic & mild AR  . VENTRAL HERNIA REPAIR  09/19/2011   Procedure: LAPAROSCOPIC VENTRAL HERNIA;  Surgeon: Merrie Roof, MD;  Location: [facility];  Service: General;  Laterality: N/A;  laparoscopic ventral hernia repair with mesh     OB History   No obstetric history on file.     Family History  Problem Relation Age of Onset  . Stroke Mother   . Stroke Father   . Heart block Brother   . Bladder Cancer Brother   . Diabetes Brother   . Stroke Brother     Social History   Tobacco Use  . Smoking status: Former Smoker    Packs/day: 1.00    Years: 20.00    Pack years: 20.00    Types: Cigarettes    Start date: 41    Quit date: 09/12/1978    Years since quitting: 41.9  . Smokeless tobacco: Never Used  Vaping Use  . Vaping Use: Never used  Substance Use Topics  . Alcohol use: Not Currently    Alcohol/week: 0.0 standard drinks    Comment: occ  . Drug use: No    Home Medications Prior to Admission medications   Medication Sig Start Date End Date Taking? Authorizing Provider  albuterol (PROVENTIL) (2.5 MG/3ML) 0.083% nebulizer solution Take 2.5 mg by nebulization every 6 (six) hours as needed for wheezing or shortness of breath.     [provider]  albuterol (VENTOLIN HFA) 108 (90 Base) MCG/ACT inhaler Inhale 2 puffs into the lungs every 4 (four) hours as needed for wheezing or shortness of breath. 03/05/20   Collene Gobble, MD  apixaban (ELIQUIS) 2.5 MG TABS tablet Take 1 tablet (2.5 mg total) by mouth 2 (two) times daily. 04/12/20   Hilty, Nadean Corwin, MD  Budeson-Glycopyrrol-Formoterol (BREZTRI AEROSPHERE) 160-9-4.8 MCG/ACT AERO Inhale 2 puffs into the lungs 2 (two) times daily. 04/20/20   Collene Gobble, MD  Budeson-Glycopyrrol-Formoterol (BREZTRI AEROSPHERE) 160-9-4.8 MCG/ACT AERO Inhale 2 puffs into the lungs in the morning and at bedtime. 05/10/20   Collene Gobble, MD  Budeson-Glycopyrrol-Formoterol (BREZTRI AEROSPHERE) 160-9-4.8 MCG/ACT AERO Inhale 2 puffs into the lungs in the morning and  at bedtime. 05/21/20   Collene Gobble, MD  budesonide-formoterol (SYMBICORT) 160-4.5 MCG/ACT inhaler Inhale 1 puff into the lungs 2 (two) times daily. 02/02/20   Collene Gobble, MD  calcium citrate-vitamin D (CITRACAL+D) 315-200 MG-UNIT per tablet Take 1 tablet by mouth daily.    [provider]  Carboxymethylcellulose Sodium (THERATEARS OP) Place 2 drops into both eyes daily.     [provider]  cholecalciferol (VITAMIN D3) 25 MCG (1000 UNIT) tablet Take 1,000 Units by mouth daily.    [provider]  diltiazem (CARDIZEM CD) 180 MG 24 hr capsule Take 180 mg by mouth daily.     [provider]  feeding supplement, ENSURE ENLIVE, (ENSURE ENLIVE) LIQD Take 237 mLs by mouth 2 (two) times daily between meals. 10/21/19   Manuella Ghazi, Pratik D, DO  fluticasone (FLONASE) 50 MCG/ACT nasal spray USE 2 SPRAY(S) IN EACH NOSTRIL ONCE DAILY IN THE EVENING 07/24/20   Rozetta Nunnery, MD  furosemide (LASIX) 40 MG tablet Take 40 mg by mouth daily.  11/22/19   [provider]  guaiFENesin-dextromethorphan (ROBITUSSIN DM) 100-10 MG/5ML syrup Take 5 mLs by mouth every 6 (six) hours as needed for  cough. Patient taking differently: Take 10 mLs by mouth every 6 (six) hours as needed for cough. 01/11/19   Arrien, Jimmy Picket, MD  KLOR-CON M20 20 MEQ tablet Take 20 mEq by mouth daily.  12/24/19   [provider]  Lactobacillus Reuteri (BIOGAIA PROBIOTIC PO) Take 1 capsule by mouth daily.    [provider]  levothyroxine (SYNTHROID, LEVOTHROID) 137 MCG tablet Take 137 mcg by mouth daily before breakfast.     [provider]  losartan (COZAAR) 25 MG tablet Take 25 mg by mouth daily.  12/28/19   [provider]  magnesium oxide (MAG-OX) 400 MG tablet Take 400 mg by mouth daily.    [provider]  Melatonin 10 MG CAPS Take 10 mg by mouth at bedtime.    [provider]  Multiple Vitamins-Minerals (MULTIVITAMIN WITH MINERALS) tablet Take 1 tablet by mouth daily.    [provider]  polyethylene glycol (MIRALAX / GLYCOLAX) packet Take 17 g by mouth daily.    [provider]  predniSONE (DELTASONE) 10 MG tablet Take 1 tablet (10 mg total) by mouth daily with breakfast. 03/14/20   Magdalen Spatz, NP  Teriparatide, Recombinant, (FORTEO) 620 MCG/2.48ML SOPN Inject 20 mcg into the skin daily before breakfast.     [provider]  Tiotropium Bromide Monohydrate (SPIRIVA RESPIMAT) 2.5 MCG/ACT AERS Inhale 2 puffs into the lungs daily. 04/12/20   [name], NP    Allergies    Ace inhibitors and Elemental sulfur  Review of Systems   Review of Systems  Respiratory: Positive for shortness of breath.   Cardiovascular: Positive for chest pain.  All other systems reviewed and are negative.   Physical Exam Updated Vital Signs BP 107/61   Pulse (!) 113   Temp 100.3 F (37.9 C) (Oral)   Resp (!) 30   Ht 5\' 3"  (1.6 m)   Wt 47.6 kg   SpO2 100%   BMI 18.60 kg/m   Physical Exam Vitals and nursing note reviewed.  Constitutional:      General: She is not in acute distress.    Appearance: She is well-developed. She  is not diaphoretic.  HENT:     Head: Normocephalic and atraumatic.  Cardiovascular:     Rate and Rhythm: Normal rate and  regular rhythm.     Heart sounds: No murmur heard. No friction rub. No gallop.   Pulmonary:     Effort: Pulmonary effort is normal. No respiratory distress.     Breath sounds: Rhonchi present. No wheezing.     Comments: There is tenderness to palpation over the left lateral ribs.  This seems to reproduce her symptoms.  There is no crepitus or palpable abnormality.  There are slight rhonchi bilaterally. Abdominal:     General: Bowel sounds are normal. There is no distension.     Palpations: Abdomen is soft.     Tenderness: There is no abdominal tenderness.  Musculoskeletal:        General: Normal range of motion.     Cervical back: Normal range of motion and neck supple.     Right lower leg: Edema present.     Left lower leg: Edema present.     Comments: There is 1+ pitting edema of both lower extremities.  Skin:    General: Skin is warm and dry.  Neurological:     Mental Status: She is alert and oriented to person, place, and time.     ED Results / Procedures / Treatments   Labs (all labs ordered are listed, but only abnormal results are displayed) Labs Reviewed  RESP PANEL BY RT-PCR (FLU A&B, COVID) [id number]  CULTURE, BLOOD (ROUTINE X 2)  CULTURE, BLOOD (ROUTINE X 2)  COMPREHENSIVE METABOLIC PANEL  CBC WITH DIFFERENTIAL/PLATELET  BRAIN NATRIURETIC PEPTIDE  LACTIC ACID, PLASMA  LACTIC ACID, PLASMA  URINALYSIS, ROUTINE W REFLEX MICROSCOPIC  TROPONIN I (HIGH SENSITIVITY)    EKG EKG Interpretation  Date/Time:  Sunday Jul 29 2020 05:24:12 EDT Ventricular Rate:  117 PR Interval:  156 QRS Duration: 72 QT Interval:  335 QTC Calculation: 468 R Axis:   21 Text Interpretation: Sinus tachycardia Atrial premature complex No significant change since 02/27/2020 Confirmed by Veryl Speak [phone]) on 07/29/2020 5:33:51 AM   Radiology No results  found.  Procedures Procedures   Medications Ordered in ED Medications - No data to display  ED Course  I have reviewed the triage vital signs and the nursing notes.  Pertinent labs & imaging results that were available during my care of the patient were reviewed by me and considered in my medical decision making (see chart for details).    MDM Rules/Calculators/A&P  Patient is a 87 year old female presenting with left-sided chest pain and shortness of breath.  She arrives here febrile with a temp of 100.3.  Laboratory studies reveal a white count of 25,000, however no obvious source of infection.  Patient's lactate is normal and vital signs are otherwise stable.  Patient was given vancomycin and Zosyn for presumptive infectious cause.  I have discussed the care with Dr. Tamala Julian from the hospitalist service who agrees to admit for further treatment.  Urinalysis in process at the time of this dictation and may reveal a source of her fever/white count.  Final Clinical Impression(s) / ED Diagnoses Final diagnoses:  None    Rx / DC Orders ED Discharge Orders    None       Veryl Speak, MD 07/29/20 2300

## 2020-07-29 NOTE — ED Notes (Signed)
Attempted to give report. RN unable to take report.

## 2020-07-29 NOTE — ED Notes (Signed)
Pt weaned to 4L [place] per Dr. Stark Jock

## 2020-07-29 NOTE — ED Triage Notes (Addendum)
Pt BIB EMS from home. Pt complaining of left sided CP and SOB since yesterday. Family reports that pt normally wears 3L [place] at baseline and around 8pm they noticed her SOB and CP getting worse. Pt was 91% on [place] and EMS started on NRB.  Pt has a hx of COPD, CHF, and brittle bones with a non traumatic rib fracture. Pt reports that pain feels like the time she fractured her rib. Pt reports 8/10 pain with RR and mvmt.  EMS also reports pt abdomen distended and bilateral pitting edema which pt reports normal for her. VS with EMS  114/50 Sinus tach at 120 with PACs 98% on NRB CBG 170 20LAC

## 2020-07-30 DIAGNOSIS — A419 Sepsis, unspecified organism: Secondary | ICD-10-CM | POA: Diagnosis not present

## 2020-07-30 DIAGNOSIS — J9601 Acute respiratory failure with hypoxia: Secondary | ICD-10-CM | POA: Diagnosis not present

## 2020-07-30 DIAGNOSIS — R6520 Severe sepsis without septic shock: Secondary | ICD-10-CM | POA: Diagnosis not present

## 2020-07-30 LAB — CBC
HCT: 38.2 % (ref 36.0–46.0)
Hemoglobin: 12.8 g/dL (ref 12.0–15.0)
MCH: 32.1 pg (ref 26.0–34.0)
MCHC: 33.5 g/dL (ref 30.0–36.0)
MCV: 95.7 fL (ref 80.0–100.0)
Platelets: 240 10*3/uL (ref 150–400)
RBC: 3.99 MIL/uL (ref 3.87–5.11)
RDW: 14.4 % (ref 11.5–15.5)
WBC: 16 10*3/uL — ABNORMAL HIGH (ref 4.0–10.5)
nRBC: 0 % (ref 0.0–0.2)

## 2020-07-30 LAB — COMPREHENSIVE METABOLIC PANEL
ALT: 23 U/L (ref 0–44)
AST: 24 U/L (ref 15–41)
Albumin: 2.9 g/dL — ABNORMAL LOW (ref 3.5–5.0)
Alkaline Phosphatase: 56 U/L (ref 38–126)
Anion gap: 10 (ref 5–15)
BUN: 19 mg/dL (ref 8–23)
CO2: 29 mmol/L (ref 22–32)
Calcium: 8.4 mg/dL — ABNORMAL LOW (ref 8.9–10.3)
Chloride: 96 mmol/L — ABNORMAL LOW (ref 98–111)
Creatinine, Ser: 0.8 mg/dL (ref 0.44–1.00)
GFR, Estimated: >60 mL/min (ref >60)
Glucose, Bld: 203 mg/dL — ABNORMAL HIGH (ref 70–99)
Potassium: 3.4 mmol/L — ABNORMAL LOW (ref 3.5–5.1)
Sodium: 135 mmol/L (ref 135–145)
Total Bilirubin: 0.7 mg/dL (ref 0.3–1.2)
Total Protein: 5.6 g/dL — ABNORMAL LOW (ref 6.5–8.1)

## 2020-07-30 LAB — MAGNESIUM: Magnesium: 2.2 mg/dL (ref 1.7–2.4)

## 2020-07-30 MED ORDER — RISAQUAD PO CAPS
1.0000 | ORAL_CAPSULE | Freq: Every day | ORAL | Status: DC
  Administered 2020-07-30 – 2020-08-02 (×4): 1 via ORAL
  Filled 2020-07-30 (×4): qty 1

## 2020-07-30 MED ORDER — PREDNISONE 10 MG PO TABS
10.0000 mg | ORAL_TABLET | Freq: Every day | ORAL | Status: DC
  Administered 2020-07-31 – 2020-08-02 (×3): 10 mg via ORAL
  Filled 2020-07-30 (×3): qty 1

## 2020-07-30 MED ORDER — POTASSIUM CHLORIDE CRYS ER 20 MEQ PO TBCR
40.0000 meq | EXTENDED_RELEASE_TABLET | Freq: Once | ORAL | Status: AC
  Administered 2020-07-30: 40 meq via ORAL
  Filled 2020-07-30: qty 2

## 2020-07-30 MED ORDER — MAGNESIUM OXIDE -MG SUPPLEMENT 400 (240 MG) MG PO TABS
400.0000 mg | ORAL_TABLET | Freq: Every day | ORAL | Status: DC
  Administered 2020-07-30 – 2020-08-02 (×4): 400 mg via ORAL
  Filled 2020-07-30 (×4): qty 1

## 2020-07-30 MED ORDER — MAGNESIUM OXIDE -MG SUPPLEMENT 400 (240 MG) MG PO TABS: 400.0000 mg | ORAL_TABLET | Freq: Every day | ORAL | Status: DC

## 2020-07-30 MED ORDER — MAGNESIUM OXIDE 400 MG PO TABS
400.0000 mg | ORAL_TABLET | Freq: Every day | ORAL | Status: DC
  Filled 2020-07-30 (×2): qty 1

## 2020-07-30 NOTE — Evaluation (Signed)
Occupational Therapy Evaluation Patient Details Name: Heather [name] MRN: [id number] DOB: 1926-12-13 Today's Date: 07/30/2020    History of Present Illness Pt is a 87 year old woman admitted with chest pain and SOB, + sepsis. PMH: COPD on 2-4 L 02 at baseline, afib, osteoporosis, hypothyroidism, AAA, chronic LE swelling, peripheral neuropathy.   Clinical Impression   Pt walks with a rollator and is assisted for showering, donning compression hose and IADL. She lives with her supportive daughter and son in [place]. Pt presents with decreased activity tolerance, generalized weakness and impaired standing balance. She is likely near her baseline, but will follow to increase activity tolerance and maximize participation in ADL. Do not anticipate pt will need post acute OT.     Follow Up Recommendations  No OT follow up    Equipment Recommendations  None recommended by OT    Recommendations for Other Services       Precautions / Restrictions Precautions Precautions: Fall Precaution Comments: dyspnea with minimal exertion      Mobility Bed Mobility Overal bed mobility: Needs Assistance Bed Mobility: Supine to Sit;Sit to Supine     Supine to sit: Supervision Sit to supine: Supervision   General bed mobility comments: HOB up    Transfers Overall transfer level: Needs assistance Equipment used: Rolling walker (2 wheeled) Transfers: Sit to/from [facility] Sit to Stand: Min guard Stand pivot transfers: Min guard       General transfer comment: min guard for safety    Balance Overall balance assessment: Needs assistance   Sitting balance-Leahy Scale: Good     Standing balance support: Bilateral upper extremity supported Standing balance-Leahy Scale: Poor Standing balance comment: reliant on RW                           ADL either performed or assessed with clinical judgement   ADL Overall ADL's : Needs assistance/impaired Eating/Feeding:  Independent   Grooming: Supervision/safety;Sitting   Upper Body Bathing: Minimal assistance;Sitting   Lower Body Bathing: Minimal assistance;Sit to/from stand   Upper Body Dressing : Set up;Sitting   Lower Body Dressing: Moderate assistance;Sit to/from stand   Toilet Transfer: Min guard;Stand-pivot;BSC   Toileting- Water quality scientist and Hygiene: Min guard;Sit to/from stand       Functional mobility during ADLs: Min guard;Rolling walker General ADL Comments: pt has been educated in breathing techniques and energy conservation strategies     Vision Baseline Vision/History: Wears glasses Wears Glasses: At all times Patient Visual Report: No change from baseline       Perception     Praxis      Pertinent Vitals/Pain Pain Assessment: No/denies pain     Hand Dominance Right   Extremity/Trunk Assessment Upper Extremity Assessment Upper Extremity Assessment: Generalized weakness;Overall [facility] for tasks assessed   Lower Extremity Assessment Lower Extremity Assessment: Defer to PT evaluation   Cervical / Trunk Assessment Cervical / Trunk Assessment: Kyphotic   Communication Communication Communication: No difficulties   Cognition Arousal/Alertness: Awake/alert Behavior During Therapy: WFL for tasks assessed/performed Overall Cognitive Status: Within Functional Limits for tasks assessed                                     General Comments       Exercises     Shoulder Instructions      Home Living Family/patient expects to be discharged to::  Private residence Living Arrangements: Children (daughter and son in law) Available Help at Discharge: Family;Available 24 hours/day Type of Home: House Home Access: Ramped entrance     Home Layout: One level     Bathroom Shower/Tub: Occupational psychologist: Handicapped height     Home Equipment: Grab bars - toilet;Grab bars - tub/shower;Walker - 2 wheels;Cane - single point;Walker - 4  wheels;Shower seat;Transport chair;Other (comment) (02)          Prior Functioning/Environment Level of Independence: Needs assistance  Gait / Transfers Assistance Needed: mod I with rollator, daughter supervises shower transfer ADL's / Homemaking Assistance Needed: daughter assists with bathing and LB dressing, pt can fix her own coffee, toilet and dress herself with exception of compression hose on the days she does not shower            OT Problem List: Decreased strength;Decreased activity tolerance;Impaired balance (sitting and/or standing);Cardiopulmonary status limiting activity      OT Treatment/Interventions: Self-care/ADL training;DME and/or AE instruction;Therapeutic activities;Patient/family education;Balance training    OT Goals(Current goals can be found in the care plan section) Acute Rehab OT Goals Patient Stated Goal: to breathe easier, return home OT Goal Formulation: With patient Time For Goal Achievement: 08/13/20 Potential to Achieve Goals: Good ADL Goals Pt Will Perform Grooming: with modified independence;standing;sitting Pt Will Transfer to Toilet: with modified independence;ambulating;bedside commode (over toilet) Pt Will Perform Toileting - Clothing Manipulation and hygiene: with modified independence;sit to/from stand  OT Frequency: Min 2X/week   Barriers to D/C:            Co-evaluation              AM-PAC OT "6 Clicks" Daily Activity     Outcome Measure Help from another person eating meals?: None Help from another person taking care of personal grooming?: A Little Help from another person toileting, which includes using toliet, bedpan, or urinal?: A Little Help from another person bathing (including washing, rinsing, drying)?: A Little Help from another person to put on and taking off regular upper body clothing?: A Little Help from another person to put on and taking off regular lower body clothing?: A Lot 6 Click Score: 18   End of  Session Equipment Utilized During Treatment: Rolling walker;Gait belt;Oxygen (3L)  Activity Tolerance: Patient limited by fatigue Patient left: in bed;with call bell/phone within reach;with bed alarm set;with family/visitor present  OT Visit Diagnosis: Other abnormalities of gait and mobility (R26.89);Unsteadiness on feet (R26.81);Muscle weakness (generalized) (M62.81);Other (comment) (decreased activity tolerance)                Time: 2633-3545 OT Time Calculation (min): 19 min Charges:  OT General Charges $OT Visit: 1 Visit OT Evaluation $OT Eval Moderate Complexity: 1 Mod  Nestor Lewandowsky, OTR/L Acute Rehabilitation Services Pager: [phone] Office: [phone]  [name] 07/30/2020, 12:01 PM

## 2020-07-30 NOTE — Plan of Care (Signed)
  Problem: Education: Goal: Knowledge of General Education information will improve Description: Including pain rating scale, medication(s)/side effects and non-pharmacologic comfort measures Outcome: Progressing   Problem: Clinical Measurements: Goal: Respiratory complications will improve Outcome: Progressing   Problem: Clinical Measurements: Goal: Cardiovascular complication will be avoided Outcome: Progressing   Problem: Pain Managment: Goal: General experience of comfort will improve Outcome: Progressing

## 2020-07-30 NOTE — Progress Notes (Signed)
PROGRESS NOTE    Heather [name]  JSE:[id number] DOB: April 12, 1926 DOA: 07/29/2020 PCP: Burnard Bunting, MD   Brief Narrative:  HPI: Heather [name] is a 87 y.o. female with medical history significant of atrial fibrillation on Eliquis, COPD, chronic respiratory failure, osteoporosis, hypothyroidism, and AAA who presents with complaints of left-sided chest pain and shortness of breath.  History is obtained from the patient's daughter who is present at bedside.  She complained of lateral chest wall discomfort 2 days ago, but was mild at that time.  Denies having any falls, trauma, or injury to her knowledge to provoke symptoms.  Pain seem to be worsened with any kind of movement, deep breathing, or palpation of the area.  Noted associated symptoms worsening shortness of breath, chronically has lower extremity swelling is reported to be unchanged with redness for which wears compression stockings, and loose stools(on 4/29 and reported usually occurs once a month).  Normally she is on 2 to 3 L just during the day and does not require nasal cannula oxygen at night.  Her daughter had tried to breathing treatments during the day without any significant change in her symptoms.  Denies any recent fever, sick contacts, cough, nausea, vomiting, or dysuria.  She has a prior history of rib and thoracic compression fractures.  Patient daughter reports that she has been on Eliquis and has not missed any doses.  In route with EMS patient O2 sats were 91% on home 3 L and she was placed on a nonrebreather.  ED Course: Upon admission into the emergency department patient was seen to have a temperature of 100.3 F, pulse 10 5-1 14, respiration 24-32, blood pressures maintained, and O2 saturations 89 -100% on 4 L nasal cannula oxygen.  Labs significant for WBC 24.2 with left shift, sodium 134, chloride 93, CO2 33, BUN 20, creatinine 0.69, total bilirubin 1.9, BNP 79, troponin 23, and lactic acid 1.  Lateral rib series  noting chronic lung disease with emphysema with chronic appearing left rib fractures and thoracic fractures.  COVID-19 and influenza screening were both negative.  Blood cultures have been obtained.  Patient was given 1 L normal saline IV fluids, vancomycin, and Zosyn.  Assessment & Plan:   Principal Problem:   Sepsis ([place]) Active Problems:   PAF (paroxysmal atrial fibrillation) ([place])   Long term current use of anticoagulant therapy   CAP (community acquired pneumonia)   Hypothyroidism   Pulmonary nodule   Acute on chronic respiratory failure with hypoxia ([facility])   Elevated troponin   History of rib fracture   Acute on chronic hypoxic respiratory failure and sepsis 2/2 Pneumonia/acute COPD exacerbation: Uses 2 to 3 L of oxygen via nasal cannula during the day.  Patient found have temperature of 100.3 F, tachycardic, and tachypneic with WBC elevated 24.2.  Chest x-ray showed chronic lung disease with emphysema with no pneumonia however follow-up CT chest without contrast showed pneumonia.  Blood cultures have been obtained and she had been started on empiric antibiotics of vancomycin and Zosyn, but no clear source of infection appreciated at this time.  COVID-19 and influenza screening were both negative.  Patient was a started on empiric antibiotics of Vancomycin and Zosyn.  Procalcitonin subsequently was significantly elevated at 39.77.  Patient visibly dyspneic after having few bites of breakfast.  Despite of this, she states that she feels much better than yesterday.  Leukocytosis improved.  Urine antigen for Streptococcus and Legionella pending.  Cultures negative.  Continue current antibiotics.  Currently  on 4 L but saturating 99%.  Advised RN to wean her down as much as we can.  Continue prednisone, bronchodilators and rest of the medications.  Left chest wall pain history of rib and thoracic fractures:   Lateral rib series noted chronic appearing left sixth through ninth rib fractures and  thoracic fractures. -Lidocaine patch to chest wall.  Denied any pain.  Elevated troponin: Acute.  Slightly elevated 23> 21, flat.  Likely demand ischemia.  Paroxysmal atrial fibrillation on chronic anticoagulation: Controlled.  Continue Eliquis and diltiazem  Essential hypertension: Blood pressures currently stable.  Continue home medications which include diltiazem, losartan and furosemide.  HFpEF:  BNP 79.  Appears euvolemic or slightly dry.  Hypothyroidism: Continue Synthroid.  AAA: Patient previously noted to have a 3.9 cm ascending thoracic aortic aneurysm last noted from CT scan of the chest from 04/2019.              Pulmonary nodule: Patient with known right pulmonary nodule has been gradually increasing in size thought to be possibly malignant in nature.  DVT prophylaxis: apixaban (ELIQUIS) tablet 2.5 mg Start: 07/29/20 1130 Place TED hose Start: 07/29/20 0856   Code Status: DNR  Family Communication:  None present at bedside.  Plan of care discussed with patient in length and with her daughter over the phone.  Status is: Inpatient  Remains inpatient appropriate because:Inpatient level of care appropriate due to severity of illness   Dispo: The patient is from: Home              Anticipated d/c is to: Home              Patient currently is not medically stable to d/c.   Difficult to place patient No        Estimated body mass index is 20 kg/m as calculated from the following:   Height as of this encounter: 5\' 3"  (1.6 m).   Weight as of this encounter: 51.2 kg.  Pressure Injury 02/28/20 Back Other (Comment) Stage 1 -  Intact skin with non-blanchable redness of a localized area usually over a bony prominence. spine and coccyx (Active)  02/28/20 0910  Location: Back  Location Orientation: Other (Comment)  Staging: Stage 1 -  Intact skin with non-blanchable redness of a localized area usually over a bony prominence.  Wound Description (Comments): spine and  coccyx  Present on Admission: Yes     Nutritional status:               Consultants:   None  Procedures:   None  Antimicrobials:  Anti-infectives (From admission, onward)   Start     Dose/Rate Route Frequency Ordered Stop   07/29/20 1730  cefTRIAXone (ROCEPHIN) 2 g in sodium chloride 0.9 % 100 mL IVPB        2 g 200 mL/hr over 30 Minutes Intravenous Every 24 hours 07/29/20 1631 08/03/20 1729   07/29/20 1730  azithromycin (ZITHROMAX) 500 mg in sodium chloride 0.9 % 250 mL IVPB        500 mg 250 mL/hr over 60 Minutes Intravenous Every 24 hours 07/29/20 1631 08/03/20 1729   07/29/20 0645  vancomycin (VANCOREADY) IVPB 1000 mg/200 mL        1,000 mg 200 mL/hr over 60 Minutes Intravenous  Once 07/29/20 0637 07/29/20 0813   07/29/20 0645  piperacillin-tazobactam (ZOSYN) IVPB 3.375 g        3.375 g 12.5 mL/hr over 240 Minutes Intravenous  Once 07/29/20 8469 07/29/20  0717         Subjective: Seen and examined.  Still visibly short of breath but improved compared to yesterday.  No new complaint.  Objective: Vitals:   07/30/20 0420 07/30/20 0500 07/30/20 0753 07/30/20 0849  BP: 117/79  105/71   Pulse: 85  71 91  Resp:   18 18  Temp: 97.7 F (36.5 C)  97.7 F (36.5 C)   TempSrc:   Oral   SpO2: 100%  100% 99%  Weight:  51.2 kg    Height:        Intake/Output Summary (Last 24 hours) at 07/30/2020 1019 Last data filed at 07/30/2020 6387 Gross per 24 hour  Intake 360.56 ml  Output --  Net 360.56 ml   Filed Weights   07/29/20 0530 07/30/20 0500  Weight: 47.6 kg 51.2 kg    Examination:  General exam: Appears calm and comfortable  Respiratory system: Diminished breath sounds, no wheezes or crackles, tachypneic Cardiovascular system: S1 & S2 heard, irregularly irregular rate and rhythm. No JVD, murmurs, rubs, gallops or clicks.  +1 pitting edema bilateral lower extremity from half way below the shin. Gastrointestinal system: Abdomen is nondistended, soft and  nontender. No organomegaly or masses felt. Normal bowel sounds heard. Central nervous system: Alert and oriented. No focal neurological deficits. Extremities: Symmetric 5 x 5 power. Skin: No rashes, lesions or ulcers Psychiatry: Judgement and insight appear normal. Mood & affect appropriate.    Data Reviewed: I have personally reviewed following labs and imaging studies  CBC: Recent Labs  Lab 07/29/20 0543 07/30/20 0200  WBC 24.2* 16.0*  NEUTROABS 20.9*  --   HGB 13.8 12.8  HCT 41.6 38.2  MCV 97.0 95.7  PLT 264 564   Basic Metabolic Panel: Recent Labs  Lab 07/29/20 0543 07/30/20 0200  NA 134* 135  K 3.6 3.4*  CL 93* 96*  CO2 33* 29  GLUCOSE 152* 203*  BUN 20 19  CREATININE 0.69 0.80  CALCIUM 8.8* 8.4*  MG  --  2.2   GFR: Estimated Creatinine Clearance: 37 mL/min (by C-G formula based on SCr of 0.8 mg/dL). Liver Function Tests: Recent Labs  Lab 07/29/20 0543 07/30/20 0200  AST 27 24  ALT 20 23  ALKPHOS 61 56  BILITOT 1.9* 0.7  PROT 5.8* 5.6*  ALBUMIN 3.4* 2.9*   No results for input(s): LIPASE, AMYLASE in the last 168 hours. No results for input(s): AMMONIA in the last 168 hours. Coagulation Profile: No results for input(s): INR, PROTIME in the last 168 hours. Cardiac Enzymes: No results for input(s): CKTOTAL, CKMB, CKMBINDEX, TROPONINI in the last 168 hours. BNP (last 3 results) No results for input(s): PROBNP in the last 8760 hours. HbA1C: No results for input(s): HGBA1C in the last 72 hours. CBG: No results for input(s): GLUCAP in the last 168 hours. Lipid Profile: No results for input(s): CHOL, HDL, LDLCALC, TRIG, CHOLHDL, LDLDIRECT in the last 72 hours. Thyroid Function Tests: No results for input(s): TSH, T4TOTAL, FREET4, T3FREE, THYROIDAB in the last 72 hours. Anemia Panel: No results for input(s): VITAMINB12, FOLATE, FERRITIN, TIBC, IRON, RETICCTPCT in the last 72 hours. Sepsis Labs: Recent Labs  Lab 07/29/20 0544 07/29/20 0744  07/29/20 1053  PROCALCITON  --   --  39.77  LATICACIDVEN 1.0 1.0  --     Recent Results (from the past 240 hour(s))  Culture, blood (routine x 2)     Status: None (Preliminary result)   Collection Time: 07/29/20  5:35 AM  Specimen: BLOOD RIGHT WRIST  Result Value Ref Range Status   Specimen Description BLOOD RIGHT WRIST  Final   Special Requests   Final    BOTTLES DRAWN AEROBIC AND ANAEROBIC Blood Culture adequate volume   Culture   Final    NO GROWTH 1 DAY Performed at [facility], [address], [place], [place] [address]    Report Status PENDING  Incomplete  Resp Panel by RT-PCR (Flu A&B, Covid) Nasopharyngeal Swab     Status: None   Collection Time: 07/29/20  5:44 AM   Specimen: Nasopharyngeal Swab; Nasopharyngeal(NP) swabs in vial transport medium  Result Value Ref Range Status   SARS Coronavirus 2 by RT PCR NEGATIVE NEGATIVE Final    Comment: (NOTE) SARS-CoV-2 target nucleic acids are NOT DETECTED.  The SARS-CoV-2 RNA is generally detectable in upper respiratory specimens during the acute phase of infection. The lowest concentration of SARS-CoV-2 viral copies this assay can detect is 138 copies/mL. A negative result does not preclude SARS-Cov-2 infection and should not be used as the sole basis for treatment or other patient management decisions. A negative result may occur with  improper specimen collection/handling, submission of specimen other than nasopharyngeal swab, presence of viral mutation(s) within the areas targeted by this assay, and inadequate number of viral copies(<138 copies/mL). A negative result must be combined with clinical observations, patient history, and epidemiological information. The expected result is Negative.  Fact Sheet for Patients:  EntrepreneurPulse.com.au  Fact Sheet for Healthcare Providers:  IncredibleEmployment.be  This test is no t yet approved or cleared by the [place] FDA and   has been authorized for detection and/or diagnosis of SARS-CoV-2 by FDA under an Emergency Use Authorization (EUA). This EUA will remain  in effect (meaning this test can be used) for the duration of the COVID-19 declaration under Section 564(b)(1) of the Act, 21 U.S.C.section 360bbb-3(b)(1), unless the authorization is terminated  or revoked sooner.       Influenza A by PCR NEGATIVE NEGATIVE Final   Influenza B by PCR NEGATIVE NEGATIVE Final    Comment: (NOTE) The Xpert Xpress SARS-CoV-2/FLU/RSV plus assay is intended as an aid in the diagnosis of influenza from Nasopharyngeal swab specimens and should not be used as a sole basis for treatment. Nasal washings and aspirates are unacceptable for Xpert Xpress SARS-CoV-2/FLU/RSV testing.  Fact Sheet for Patients: EntrepreneurPulse.com.au  Fact Sheet for Healthcare Providers: IncredibleEmployment.be  This test is not yet approved or cleared by the [place] FDA and has been authorized for detection and/or diagnosis of SARS-CoV-2 by FDA under an Emergency Use Authorization (EUA). This EUA will remain in effect (meaning this test can be used) for the duration of the COVID-19 declaration under Section 564(b)(1) of the Act, 21 U.S.C. section 360bbb-3(b)(1), unless the authorization is terminated or revoked.  Performed at [facility], [place] [address] [place], [place] [address]   Culture, blood (routine x 2)     Status: None (Preliminary result)   Collection Time: 07/29/20  5:49 AM   Specimen: BLOOD RIGHT HAND  Result Value Ref Range Status   Specimen Description BLOOD RIGHT HAND  Final   Special Requests   Final    BOTTLES DRAWN AEROBIC AND ANAEROBIC Blood Culture results may not be optimal due to an inadequate volume of blood received in culture bottles   Culture   Final    NO GROWTH 1 DAY Performed at [facility], [place] [address], [place], [place] [address]  Report Status  PENDING  Incomplete      Radiology Studies: DG Ribs Unilateral W/Chest Left  Result Date: 07/29/2020 CLINICAL DATA:  87 year old female with left chest pain and shortness of breath since yesterday. On home oxygen. EXAM: LEFT RIBS AND CHEST - 3+ VIEW COMPARISON:  Chest CT 10/13/2019 and earlier. FINDINGS: Chronic pulmonary hyperinflation with emphysema demonstrated by CT last year. Chronic eventration or elevation of the right hemidiaphragm has mildly progressed since 2020 but appears stable since last year. Tortuous thoracic aorta with calcified atherosclerosis. Other mediastinal contours are within normal limits. Visualized tracheal air column is within normal limits. No pneumothorax or pulmonary edema. Left lung base appears clear. Right lung base atelectasis. No air bronchograms identified. Three oblique views of the left ribs. Osteopenia. Chronic left lateral 6th through 9th rib fractures. No acute displaced left rib fracture identified. Multilevel chronic thoracic compression fractures. Previous lumbar interbody fusion. Advanced degeneration at the left glenohumeral joint. No acute osseous abnormality is evident. Negative visible bowel gas pattern.  Stable cholecystectomy clips. IMPRESSION: 1. Chronic lung disease with Emphysema (ICD10-J43.9). Eventration/elevation of the right hemidiaphragm with right lung base atelectasis has not significantly changed since last year. No acute cardiopulmonary abnormality identified. 2. Chronic left rib fractures and thoracic compression fractures. No acute rib fracture identified. Electronically Signed   By: Genevie Ann M.D.   On: 07/29/2020 06:40   CT CHEST WO CONTRAST  Result Date: 07/29/2020 CLINICAL DATA:  Chest pain and shortness of breath. EXAM: CT CHEST WITHOUT CONTRAST TECHNIQUE: Multidetector CT imaging of the chest was performed following the standard protocol without IV contrast. COMPARISON:  10/13/2019 FINDINGS: Cardiovascular: The heart size is normal. No  substantial pericardial effusion. Coronary artery calcification is evident. Atherosclerotic calcification is noted in the wall of the thoracic aorta. Mediastinum/Nodes: 2 cm short axis calcified lymph node identified in the subcarinal station, new in the interval. No other mediastinal lymphadenopathy evident. No evidence for gross hilar lymphadenopathy although assessment is limited by the lack of intravenous contrast on today's study. The esophagus has normal imaging features. There is no axillary lymphadenopathy. Lungs/Pleura: Moderate to advanced changes of emphysema noted. Focal airspace consolidation posterior lingula is suspicious for pneumonia. Asymmetric elevation right hemidiaphragm with adjacent collapse/consolidative opacity in the right lower lobe with evidence of right lower lobe airway impaction. Subsegmental atelectasis noted in the right middle lobe. Calcified 3.2 x 2.3 cm anterior right middle lobe nodule seen on image 125/4, increased from 2.0 x 1.4 cm previously. 4 mm right lower lobe nodule on 69/8 is stable. Upper Abdomen: Unremarkable. Musculoskeletal: No worrisome lytic or sclerotic osseous abnormality. IMPRESSION: 1. New focal airspace consolidation in the posterior lingula is suspicious for pneumonia. 2. Interval progression of the calcified right middle lobe pulmonary nodule, now measuring up to 3.2 cm. Associated progression of a 2 cm short axis calcified subcarinal lymph node, concerning for metastatic disease. 3. Asymmetric elevation right hemidiaphragm with adjacent collapse/consolidative opacity in the right lower lobe with evidence of right lower lobe airway impaction. Aspiration would be a consideration. 4. Aortic Atherosclerosis (ICD10-I70.0) and Emphysema (ICD10-J43.9). Electronically Signed   By: Misty Stanley M.D.   On: 07/29/2020 10:00    Scheduled Meds: . albuterol  2.5 mg Nebulization BID  . apixaban  2.5 mg Oral BID  . arformoterol  15 mcg Nebulization BID  .  budesonide (PULMICORT) nebulizer solution  0.5 mg Nebulization BID  . diltiazem  180 mg Oral Daily  . feeding supplement  237 mL Oral BID BM  .  fluticasone  2 spray Each Nare QPM  . furosemide  40 mg Oral Daily  . BioGaia Probiotic  5 drop Oral Daily  . levothyroxine  137 mcg Oral QAC breakfast  . lidocaine  1 patch Transdermal Q24H  . losartan  25 mg Oral Daily  . magnesium oxide  400 mg Oral Daily  . polyethylene glycol  17 g Oral Daily  . potassium chloride  40 mEq Oral Once  . [START ON 07/31/2020] predniSONE  10 mg Oral Q breakfast  . predniSONE  40 mg Oral Q breakfast   Continuous Infusions: . azithromycin 500 mg (07/29/20 2041)  . cefTRIAXone (ROCEPHIN)  IV 2 g (07/29/20 1956)     LOS: 1 day   Time spent: 37-minute   Darliss Cheney, MD [facility]  07/30/2020, 10:19 AM   How to contact the [facility] Attending or Consulting provider [place] or covering provider during after hours [place], for this patient?  1. Check the care team in [facility] and look for a) attending/consulting [facility] provider listed and b) the [facility] team listed. Page or secure chat [phone]. 2. Log into [email or web address] and use [facility]'s universal password to access. If you do not have the password, please contact the hospital operator. 3. Locate the [facility] provider you are looking for under [facility] and page to a number that you can be directly reached. 4. If you still have difficulty reaching the provider, please page the [facility] (Director on Call) for the Hospitalists listed on amion for assistance.

## 2020-07-30 NOTE — Evaluation (Signed)
Physical Therapy Evaluation Patient Details Name: Heather [name] MRN: [id number] DOB: 03-Jan-1927 Today's Date: 07/30/2020   History of Present Illness  Pt is a 87 year old woman admitted with chest pain and SOB, + sepsis. PMH: COPD on 2-4 L 02 at baseline, afib, osteoporosis, hypothyroidism, AAA, chronic LE swelling, peripheral neuropathy.  Clinical Impression  Pt admitted with above diagnosis. Pt able to move around with min guard assist to supervision.  Should progress well and be able to go home with HHPT f/u.  Will follow acutely. Pt currently with functional limitations due to the deficits listed below (see PT Problem List). Pt will benefit from skilled PT to increase their independence and safety with mobility to allow discharge to the venue listed below.      Follow Up Recommendations Home health PT    Equipment Recommendations  None recommended by PT    Recommendations for Other Services       Precautions / Restrictions Precautions Precautions: Fall Precaution Comments: dyspnea with minimal exertion Restrictions Weight Bearing Restrictions: No      Mobility  Bed Mobility Overal bed mobility: Needs Assistance Bed Mobility: Supine to Sit;Sit to Supine     Supine to sit: Supervision Sit to supine: Supervision   General bed mobility comments: HOB up    Transfers Overall transfer level: Needs assistance Equipment used: Rolling walker (2 wheeled) Transfers: Sit to/from [facility] Sit to Stand: Min guard Stand pivot transfers: Min guard       General transfer comment: min guard for safety.  Pt pivotted with RW to 3N1 and had BM/urinated.  Needed assist to be cleaned with pt standing at least 5 min while PT cleaned her and washed her back and LEs as nurse tech came in to do bath.  Pt was able to take pivotal steps back to bed and NT finished bath prior to PT assisting pt back to bed.  Ambulation/Gait                Stairs             Wheelchair Mobility    Modified Rankin (Stroke Patients Only)       Balance Overall balance assessment: Needs assistance Sitting-balance support: No upper extremity supported;Feet supported Sitting balance-Leahy Scale: Good     Standing balance support: Bilateral upper extremity supported Standing balance-Leahy Scale: Poor Standing balance comment: reliant on RW                             Pertinent Vitals/Pain Pain Assessment: No/denies pain    Home Living Family/patient expects to be discharged to:: Private residence Living Arrangements: Children (daughter and son in law) Available Help at Discharge: Family;Available 24 hours/day Type of Home: House Home Access: Ramped entrance     Home Layout: One level Home Equipment: Grab bars - toilet;Grab bars - tub/shower;Walker - 2 wheels;Cane - single point;Walker - 4 wheels;Shower seat;Transport chair;Other (comment) (02)      Prior Function Level of Independence: Needs assistance   Gait / Transfers Assistance Needed: mod I with rollator, daughter supervises shower transfer  ADL's / Homemaking Assistance Needed: daughter assists with bathing and LB dressing, pt can fix her own coffee, toilet and dress herself with exception of compression hose on the days she does not shower        Hand Dominance   Dominant Hand: Right    Extremity/Trunk Assessment   Upper Extremity Assessment Upper  Extremity Assessment: Defer to OT evaluation    Lower Extremity Assessment Lower Extremity Assessment: Overall WFL for tasks assessed    Cervical / Trunk Assessment Cervical / Trunk Assessment: Kyphotic  Communication   Communication: No difficulties  Cognition Arousal/Alertness: Awake/alert Behavior During Therapy: WFL for tasks assessed/performed Overall Cognitive Status: Within Functional Limits for tasks assessed                                        General Comments General comments (skin  integrity, edema, etc.): VSS with pt on 4LO2    Exercises General Exercises - Lower Extremity Ankle Circles/Pumps: AROM;Both;10 reps;Supine Long Arc Quad: AROM;Both;10 reps;Seated   Assessment/Plan    PT Assessment Patient needs continued PT services  PT Problem List Decreased mobility;Decreased balance;Decreased activity tolerance;Decreased knowledge of use of DME;Decreased safety awareness;Decreased knowledge of precautions;Cardiopulmonary status limiting activity       PT Treatment Interventions DME instruction;Gait training;Functional mobility training;Therapeutic activities;Therapeutic exercise;Balance training;Patient/family education    PT Goals (Current goals can be found in the Care Plan section)  Acute Rehab PT Goals Patient Stated Goal: to breathe easier, return home PT Goal Formulation: With patient Time For Goal Achievement: 08/13/20 Potential to Achieve Goals: Good    Frequency Min 3X/week   Barriers to discharge        Co-evaluation               AM-PAC PT "6 Clicks" Mobility  Outcome Measure Help needed turning from your back to your side while in a flat bed without using bedrails?: None Help needed moving from lying on your back to sitting on the side of a flat bed without using bedrails?: None Help needed moving to and from a bed to a chair (including a wheelchair)?: A Little Help needed standing up from a chair using your arms (e.g., wheelchair or bedside chair)?: A Little Help needed to walk in hospital room?: A Little Help needed climbing 3-5 steps with a railing? : A Little 6 Click Score: 20    End of Session Equipment Utilized During Treatment: Gait belt;Oxygen Activity Tolerance: Patient limited by fatigue Patient left: with call bell/phone within reach;in bed;with bed alarm set Nurse Communication: Mobility status PT Visit Diagnosis: Muscle weakness (generalized) (M62.81)    Time: 8366-2947 PT Time Calculation (min) (ACUTE ONLY): 24  min   Charges:   PT Evaluation $PT Eval Moderate Complexity: 1 Mod PT Treatments $Therapeutic Activity: 8-22 mins        Jakhai Fant M,PT Acute Rehab Services [phone] (pager)  Alvira Philips 07/30/2020, 1:13 PM

## 2020-07-31 ENCOUNTER — Ambulatory Visit: Payer: Medicare HMO | Admitting: Podiatry

## 2020-07-31 DIAGNOSIS — J9601 Acute respiratory failure with hypoxia: Secondary | ICD-10-CM | POA: Diagnosis not present

## 2020-07-31 DIAGNOSIS — R6520 Severe sepsis without septic shock: Secondary | ICD-10-CM | POA: Diagnosis not present

## 2020-07-31 DIAGNOSIS — A419 Sepsis, unspecified organism: Secondary | ICD-10-CM | POA: Diagnosis not present

## 2020-07-31 LAB — URINALYSIS, ROUTINE W REFLEX MICROSCOPIC
Bilirubin Urine: NEGATIVE
Glucose, UA: NEGATIVE mg/dL
Hgb urine dipstick: NEGATIVE
Ketones, ur: NEGATIVE mg/dL
Leukocytes,Ua: NEGATIVE
Nitrite: NEGATIVE
Protein, ur: NEGATIVE mg/dL
Specific Gravity, Urine: 1.01 (ref 1.005–1.030)
pH: 6 (ref 5.0–8.0)

## 2020-07-31 LAB — BASIC METABOLIC PANEL
Anion gap: 7 (ref 5–15)
BUN: 20 mg/dL (ref 8–23)
CO2: 33 mmol/L — ABNORMAL HIGH (ref 22–32)
Calcium: 8.3 mg/dL — ABNORMAL LOW (ref 8.9–10.3)
Chloride: 99 mmol/L (ref 98–111)
Creatinine, Ser: 0.7 mg/dL (ref 0.44–1.00)
GFR, Estimated: >60 mL/min (ref >60)
Glucose, Bld: 195 mg/dL — ABNORMAL HIGH (ref 70–99)
Potassium: 3.8 mmol/L (ref 3.5–5.1)
Sodium: 139 mmol/L (ref 135–145)

## 2020-07-31 LAB — STREP PNEUMONIAE URINARY ANTIGEN: Strep Pneumo Urinary Antigen: NEGATIVE

## 2020-07-31 LAB — CBC WITH DIFFERENTIAL/PLATELET
Abs Immature Granulocytes: 0.15 10*3/uL — ABNORMAL HIGH (ref 0.00–0.07)
Basophils Absolute: 0 10*3/uL (ref 0.0–0.1)
Basophils Relative: 0 %
Eosinophils Absolute: 0 10*3/uL (ref 0.0–0.5)
Eosinophils Relative: 0 %
HCT: 38.1 % (ref 36.0–46.0)
Hemoglobin: 12.7 g/dL (ref 12.0–15.0)
Immature Granulocytes: 1 %
Lymphocytes Relative: 4 %
Lymphs Abs: 0.6 10*3/uL — ABNORMAL LOW (ref 0.7–4.0)
MCH: 31.8 pg (ref 26.0–34.0)
MCHC: 33.3 g/dL (ref 30.0–36.0)
MCV: 95.5 fL (ref 80.0–100.0)
Monocytes Absolute: 0.6 10*3/uL (ref 0.1–1.0)
Monocytes Relative: 4 %
Neutro Abs: 13.7 10*3/uL — ABNORMAL HIGH (ref 1.7–7.7)
Neutrophils Relative %: 91 %
Platelets: 275 10*3/uL (ref 150–400)
RBC: 3.99 MIL/uL (ref 3.87–5.11)
RDW: 13.8 % (ref 11.5–15.5)
WBC: 15 10*3/uL — ABNORMAL HIGH (ref 4.0–10.5)
nRBC: 0 % (ref 0.0–0.2)

## 2020-07-31 LAB — PROCALCITONIN: Procalcitonin: 46.06 ng/mL

## 2020-07-31 LAB — D-DIMER, QUANTITATIVE: D-Dimer, Quant: 0.42 ug/mL-FEU (ref 0.00–0.50)

## 2020-07-31 NOTE — Progress Notes (Addendum)
PROGRESS NOTE    Heather [name]  IHK:[id number] DOB: [birth year] DOA: 07/29/2020 PCP: Burnard Bunting, MD   Brief Narrative:  Heather [name] is a 87 y.o. female with medical history significant of atrial fibrillation on Eliquis, COPD, chronic respiratory failure on as needed oxygen requirement with 2 to 3 L, osteoporosis, hypothyroidism, and AAA who presented with complaints of left-sided chest pain and shortness of breath.  Her daughter had tried to breathing treatments during the day without any significant change in her symptoms.  Denies any recent fever, sick contacts, cough, nausea, vomiting, or dysuria.   Upon admission into the emergency department patient was seen to have a temperature of 100.3 F, pulse 10 5-1 14, respiration 24-32, blood pressures maintained, and O2 saturations 89 -100% on 4 L nasal cannula oxygen.  Labs significant for WBC 24.2 with left shift, sodium 134, chloride 93, CO2 33, BUN 20, creatinine 0.69, total bilirubin 1.9, BNP 79, troponin 23, and lactic acid 1.  Lateral rib series noting chronic lung disease with emphysema with chronic appearing left rib fractures and thoracic fractures.  Initially chest x-ray negative for pneumonia but follow-up CT chest without contrast showed pneumonia. COVID-19 and influenza screening were both negative.  Blood cultures have been obtained.  Patient was given 1 L normal saline IV fluids, vancomycin, and Zosyn.  Admitted under hospital service and continued on Rocephin and Zithromax.  Has improved somewhat.  Assessment & Plan:   Principal Problem:   Sepsis ([place]) Active Problems:   PAF (paroxysmal atrial fibrillation) ([place])   Long term current use of anticoagulant therapy   CAP (community acquired pneumonia)   Hypothyroidism   Pulmonary nodule   Acute on chronic respiratory failure with hypoxia (H[facility])   Elevated troponin   History of rib fracture   Acute on chronic hypoxic respiratory failure and sepsis 2/2 Pneumonia/acute  COPD exacerbation: Uses 2 to 3 L of oxygen via nasal cannula during the day.  Patient found have temperature of 100.3 F, tachycardic, and tachypneic with WBC elevated 24.2.  Chest x-ray showed chronic lung disease with emphysema with no pneumonia however follow-up CT chest without contrast showed pneumonia.   COVID-19 and influenza screening were both negative.   Procalcitonin subsequently was significantly elevated at 39.77.  She is afebrile.  Leukocytosis improving.  Although she states that she is a little better compared to yesterday however she is visibly dyspneic again but looks slightly improved compared to yesterday.  We will recheck procalcitonin level.  Would also like to rule out PE and will start with D-dimer.  Continue Rocephin and Zithromax.  All cultures negative so far.  Urine is still not drawn for streptococcal and Legionella.  Advised RN to do so.  Continue bronchodilators and prednisone.  Left chest wall pain history of rib and thoracic fractures:   Lateral rib series noted chronic appearing left sixth through ninth rib fractures and thoracic fractures. -Lidocaine patch to chest wall.  Denied any pain.  Elevated troponin: Acute.  Slightly elevated 23> 21, flat.  Likely demand ischemia.  Paroxysmal atrial fibrillation on chronic anticoagulation: Controlled.  Continue Eliquis and diltiazem  Essential hypertension: Blood pressure slightly on the low side.  Continue diltiazem and furosemide but hold losartan.  Chronic diastolic congestive heart failure:  BNP 79.  Appears euvolemic or slightly dry.  Hypothyroidism: Continue Synthroid.  AAA: Patient previously noted to have a 3.9 cm ascending thoracic aortic aneurysm last noted from CT scan of the chest from 04/2019.  Pulmonary nodule: Patient with known right pulmonary nodule has been gradually increasing in size thought to be possibly malignant in nature.  DVT prophylaxis: apixaban (ELIQUIS) tablet 2.5 mg Start:  07/29/20 1130 Place TED hose Start: 07/29/20 0856   Code Status: DNR  Family Communication:  None present at bedside.  Plan of care discussed with patient in length and with her daughter over the phone.   Status is: Inpatient  Remains inpatient appropriate because:Inpatient level of care appropriate due to severity of illness   Dispo: The patient is from: Home              Anticipated d/c is to: Home              Patient currently is not medically stable to d/c.   Difficult to place patient No        Estimated body mass index is 20 kg/m as calculated from the following:   Height as of this encounter: 5\' 3"  (1.6 m).   Weight as of this encounter: 51.2 kg.  Pressure Injury 02/28/20 Back Other (Comment) Stage 1 -  Intact skin with non-blanchable redness of a localized area usually over a bony prominence. spine and coccyx (Active)  02/28/20 0910  Location: Back  Location Orientation: Other (Comment)  Staging: Stage 1 -  Intact skin with non-blanchable redness of a localized area usually over a bony prominence.  Wound Description (Comments): spine and coccyx  Present on Admission: Yes     Nutritional status:               Consultants:   None  Procedures:   None  Antimicrobials:  Anti-infectives (From admission, onward)   Start     Dose/Rate Route Frequency Ordered Stop   07/29/20 1730  cefTRIAXone (ROCEPHIN) 2 g in sodium chloride 0.9 % 100 mL IVPB        2 g 200 mL/hr over 30 Minutes Intravenous Every 24 hours 07/29/20 1631 08/03/20 1729   07/29/20 1730  azithromycin (ZITHROMAX) 500 mg in sodium chloride 0.9 % 250 mL IVPB        500 mg 250 mL/hr over 60 Minutes Intravenous Every 24 hours 07/29/20 1631 08/03/20 1729   07/29/20 0645  vancomycin (VANCOREADY) IVPB 1000 mg/200 mL        1,000 mg 200 mL/hr over 60 Minutes Intravenous  Once 07/29/20 0637 07/29/20 0813   07/29/20 0645  piperacillin-tazobactam (ZOSYN) IVPB 3.375 g        3.375 g 12.5 mL/hr  over 240 Minutes Intravenous  Once 07/29/20 2440 07/29/20 0717         Subjective: Seen and examined.  Sitting in the bed but is still dyspneic when talking.  She states that she feels slightly better than yesterday and looks slightly better as well.  Objective: Vitals:   07/30/20 1921 07/30/20 2306 07/31/20 0333 07/31/20 0814  BP: 101/60 107/65 104/83   Pulse: 70 77 77 74  Resp: 20 20 20 17   Temp: 98.1 F (36.7 C) 98.1 F (36.7 C) 97.9 F (36.6 C)   TempSrc:   Oral   SpO2: 100% 99% 99% 99%  Weight:      Height:       No intake or output data in the 24 hours ending 07/31/20 1117 Filed Weights   07/29/20 0530 07/30/20 0500  Weight: 47.6 kg 51.2 kg    Examination:  General exam: Appears calm and comfortable  Respiratory system: Clear to auscultation with some diminished breath  sounds.  Slightly tachypneic. Cardiovascular system: S1 & S2 heard, RRR. No JVD, murmurs, rubs, gallops or clicks. No pedal edema. Gastrointestinal system: Abdomen is nondistended, soft and nontender. No organomegaly or masses felt. Normal bowel sounds heard. Central nervous system: Alert and oriented. No focal neurological deficits. Extremities: Symmetric 5 x 5 power. Skin: No rashes, lesions or ulcers.  Psychiatry: Judgement and insight appear normal. Mood & affect appropriate.    Data Reviewed: I have personally reviewed following labs and imaging studies  CBC: Recent Labs  Lab 07/29/20 0543 07/30/20 0200 07/31/20 0402  WBC 24.2* 16.0* 15.0*  NEUTROABS 20.9*  --  13.7*  HGB 13.8 12.8 12.7  HCT 41.6 38.2 38.1  MCV 97.0 95.7 95.5  PLT 264 240 401   Basic Metabolic Panel: Recent Labs  Lab 07/29/20 0543 07/30/20 0200 07/31/20 0402  NA 134* 135 139  K 3.6 3.4* 3.8  CL 93* 96* 99  CO2 33* 29 33*  GLUCOSE 152* 203* 195*  BUN 20 19 20   CREATININE 0.69 0.80 0.70  CALCIUM 8.8* 8.4* 8.3*  MG  --  2.2  --    GFR: Estimated Creatinine Clearance: 37 mL/min (by C-G formula based on  SCr of 0.7 mg/dL). Liver Function Tests: Recent Labs  Lab 07/29/20 0543 07/30/20 0200  AST 27 24  ALT 20 23  ALKPHOS 61 56  BILITOT 1.9* 0.7  PROT 5.8* 5.6*  ALBUMIN 3.4* 2.9*   No results for input(s): LIPASE, AMYLASE in the last 168 hours. No results for input(s): AMMONIA in the last 168 hours. Coagulation Profile: No results for input(s): INR, PROTIME in the last 168 hours. Cardiac Enzymes: No results for input(s): CKTOTAL, CKMB, CKMBINDEX, TROPONINI in the last 168 hours. BNP (last 3 results) No results for input(s): PROBNP in the last 8760 hours. HbA1C: No results for input(s): HGBA1C in the last 72 hours. CBG: No results for input(s): GLUCAP in the last 168 hours. Lipid Profile: No results for input(s): CHOL, HDL, LDLCALC, TRIG, CHOLHDL, LDLDIRECT in the last 72 hours. Thyroid Function Tests: No results for input(s): TSH, T4TOTAL, FREET4, T3FREE, THYROIDAB in the last 72 hours. Anemia Panel: No results for input(s): VITAMINB12, FOLATE, FERRITIN, TIBC, IRON, RETICCTPCT in the last 72 hours. Sepsis Labs: Recent Labs  Lab 07/29/20 0544 07/29/20 0744 07/29/20 1053  PROCALCITON  --   --  39.77  LATICACIDVEN 1.0 1.0  --     Recent Results (from the past 240 hour(s))  Culture, blood (routine x 2)     Status: None (Preliminary result)   Collection Time: 07/29/20  5:35 AM   Specimen: BLOOD RIGHT WRIST  Result Value Ref Range Status   Specimen Description BLOOD RIGHT WRIST  Final   Special Requests   Final    BOTTLES DRAWN AEROBIC AND ANAEROBIC Blood Culture adequate volume   Culture   Final    NO GROWTH 1 DAY Performed at [facility], [address] [place], [place] [address]    Report Status PENDING  Incomplete  Resp Panel by RT-PCR (Flu A&B, Covid) Nasopharyngeal Swab     Status: None   Collection Time: 07/29/20  5:44 AM   Specimen: Nasopharyngeal Swab; Nasopharyngeal(NP) swabs in vial transport medium  Result Value Ref Range Status   SARS Coronavirus  2 by RT PCR NEGATIVE NEGATIVE Final    Comment: (NOTE) SARS-CoV-2 target nucleic acids are NOT DETECTED.  The SARS-CoV-2 RNA is generally detectable in upper respiratory specimens during the acute phase of infection.  The lowest concentration of SARS-CoV-2 viral copies this assay can detect is 138 copies/mL. A negative result does not preclude SARS-Cov-2 infection and should not be used as the sole basis for treatment or other patient management decisions. A negative result may occur with  improper specimen collection/handling, submission of specimen other than nasopharyngeal swab, presence of viral mutation(s) within the areas targeted by this assay, and inadequate number of viral copies(<138 copies/mL). A negative result must be combined with clinical observations, patient history, and epidemiological information. The expected result is Negative.  Fact Sheet for Patients:  EntrepreneurPulse.com.au  Fact Sheet for Healthcare Providers:  IncredibleEmployment.be  This test is no t yet approved or cleared by the [place] FDA and  has been authorized for detection and/or diagnosis of SARS-CoV-2 by FDA under an Emergency Use Authorization (EUA). This EUA will remain  in effect (meaning this test can be used) for the duration of the COVID-19 declaration under Section 564(b)(1) of the Act, 21 U.S.C.section 360bbb-3(b)(1), unless the authorization is terminated  or revoked sooner.       Influenza A by PCR NEGATIVE NEGATIVE Final   Influenza B by PCR NEGATIVE NEGATIVE Final    Comment: (NOTE) The Xpert Xpress SARS-CoV-2/FLU/RSV plus assay is intended as an aid in the diagnosis of influenza from Nasopharyngeal swab specimens and should not be used as a sole basis for treatment. Nasal washings and aspirates are unacceptable for Xpert Xpress SARS-CoV-2/FLU/RSV testing.  Fact Sheet for Patients: EntrepreneurPulse.com.au  Fact  Sheet for Healthcare Providers: IncredibleEmployment.be  This test is not yet approved or cleared by the [place] FDA and has been authorized for detection and/or diagnosis of SARS-CoV-2 by FDA under an Emergency Use Authorization (EUA). This EUA will remain in effect (meaning this test can be used) for the duration of the COVID-19 declaration under Section 564(b)(1) of the Act, 21 U.S.C. section 360bbb-3(b)(1), unless the authorization is terminated or revoked.  Performed at [facility], [place] [address] [place], [place] [address]   Culture, blood (routine x 2)     Status: None (Preliminary result)   Collection Time: 07/29/20  5:49 AM   Specimen: BLOOD RIGHT HAND  Result Value Ref Range Status   Specimen Description BLOOD RIGHT HAND  Final   Special Requests   Final    BOTTLES DRAWN AEROBIC AND ANAEROBIC Blood Culture results may not be optimal due to an inadequate volume of blood received in culture bottles   Culture   Final    NO GROWTH 1 DAY Performed at [facility], [place] [address], [place], [place] [address]    Report Status PENDING  Incomplete      Radiology Studies: No results found.  Scheduled Meds: . acidophilus  1 capsule Oral Daily  . albuterol  2.5 mg Nebulization BID  . apixaban  2.5 mg Oral BID  . arformoterol  15 mcg Nebulization BID  . budesonide (PULMICORT) nebulizer solution  0.5 mg Nebulization BID  . diltiazem  180 mg Oral Daily  . feeding supplement  237 mL Oral BID BM  . fluticasone  2 spray Each Nare QPM  . furosemide  40 mg Oral Daily  . levothyroxine  137 mcg Oral QAC breakfast  . lidocaine  1 patch Transdermal Q24H  . losartan  25 mg Oral Daily  . magnesium oxide  400 mg Oral Daily  . polyethylene glycol  17 g Oral Daily  . predniSONE  10 mg Oral Q breakfast   Continuous  Infusions: . azithromycin 500 mg (07/30/20 1752)  . cefTRIAXone (ROCEPHIN)  IV 2 g (07/30/20 1716)     LOS: 2 days   Time spent:  30-minute   Darliss Cheney, MD [facility]  07/31/2020, 11:17 AM   How to contact the [facility] Attending or Consulting provider [place] or covering provider during after hours [place], for this patient?  1. Check the care team in [facility] and look for a) attending/consulting [facility] provider listed and b) the [facility] team listed. Page or secure chat [phone]. 2. Log into [email or web address] and use 's universal password to access. If you do not have the password, please contact the hospital operator. 3. Locate the [facility] provider you are looking for under [facility] and page to a number that you can be directly reached. 4. If you still have difficulty reaching the provider, please page the [facility] (Director on Call) for the Hospitalists listed on amion for assistance.

## 2020-07-31 NOTE — Progress Notes (Signed)
SATURATION QUALIFICATIONS: (This note is used to comply with regulatory documentation for home oxygen)  Patient Saturations on Room Air at Rest = 80%  Patient Saturations on Room Air while Ambulating = NT as pt desats on RA at rest  Patient Saturations on 6 Liters of oxygen while Ambulating = 90%  Please briefly explain why patient needs home oxygen:Pt needed 4L O2 at rest and 6L with activity to maintain O2 >90%. August Gosser M,PT Acute Rehab Services [phone] (pager)

## 2020-07-31 NOTE — Progress Notes (Signed)
Physical Therapy Treatment Patient Details Name: Heather [name] MRN: [id number] DOB: 19-Nov-1926 Today's Date: 07/31/2020    History of Present Illness Pt is a 87 year old woman admitted with chest pain and SOB, + sepsis. PMH: COPD on 2-4 L 02 at baseline, afib, osteoporosis, hypothyroidism, AAA, chronic LE swelling, peripheral neuropathy.    PT Comments    Pt admitted with above diagnosis. Pt ambulated in hallway today and was DOE 3/4 and needed cues for energy conservation techniques. Pt currently needs 4LO2 at rest and 6L with activity to maintain sats >90%.  Will progress pt as able.  Pt currently with functional limitations due to balance and endurance deficits. Pt will benefit from skilled PT to increase their independence and safety with mobility to allow discharge to the venue listed below.    SATURATION QUALIFICATIONS: (This note is used to comply with regulatory documentation for home oxygen)  Patient Saturations on Room Air at Rest = 80%  Patient Saturations on Room Air while Ambulating = NT as pt desats on RA at rest  Patient Saturations on 6 Liters of oxygen while Ambulating = 90%  Please briefly explain why patient needs home oxygen:Pt needed 4L O2 at rest and 6L with activity to maintain O2 >90%.    Follow Up Recommendations  Home health PT     Equipment Recommendations  None recommended by PT    Recommendations for Other Services       Precautions / Restrictions Precautions Precautions: Fall Precaution Comments: dyspnea with minimal exertion Restrictions Weight Bearing Restrictions: No    Mobility  Bed Mobility Overal bed mobility: Needs Assistance Bed Mobility: Supine to Sit;Sit to Supine     Supine to sit: Supervision Sit to supine: Supervision   General bed mobility comments: HOB up    Transfers Overall transfer level: Needs assistance Equipment used: Rolling walker (2 wheeled) Transfers: Sit to/from [facility] Sit to Stand:  Min guard Stand pivot transfers: Min guard       General transfer comment: min guard for safety.  Ambulation/Gait Ambulation/Gait assistance: Min guard Gait Distance (Feet): 250 Feet Assistive device: Rolling walker (2 wheeled) Gait Pattern/deviations: Step-through pattern;Decreased stride length   Gait velocity interpretation: <1.8 ft/sec, indicate of risk for recurrent falls General Gait Details: Occasional cues to stay close to rW but overall good safety with RW.  Pt needed cues to stop and take standing rest breaks and to purse lip breathe as pt DOE 3/4 at times with activity. Also encouraged her not to walk and talk.   Stairs             Wheelchair Mobility    Modified Rankin (Stroke Patients Only)       Balance Overall balance assessment: Needs assistance Sitting-balance support: No upper extremity supported;Feet supported Sitting balance-Leahy Scale: Good     Standing balance support: Bilateral upper extremity supported Standing balance-Leahy Scale: Poor Standing balance comment: reliant on RW                            Cognition Arousal/Alertness: Awake/alert Behavior During Therapy: WFL for tasks assessed/performed Overall Cognitive Status: Within Functional Limits for tasks assessed                                        Exercises General Exercises - Lower Extremity Ankle Circles/Pumps: AROM;Both;10 reps;Supine Long  Arc Quad: AROM;Both;10 reps;Seated    General Comments General comments (skin integrity, edema, etc.): Pt desat to 80% on RA and needed 4L at rest to maintain sats and up to 6L with activity.  Discussed Energy Conservation and gave pt handout.      Pertinent Vitals/Pain Pain Assessment: No/denies pain    Home Living                      Prior Function            PT Goals (current goals can now be found in the care plan section) Acute Rehab PT Goals Patient Stated Goal: to breathe easier,  return home Progress towards PT goals: Progressing toward goals    Frequency    Min 3X/week      PT Plan Current plan remains appropriate    Co-evaluation              AM-PAC PT "6 Clicks" Mobility   Outcome Measure  Help needed turning from your back to your side while in a flat bed without using bedrails?: None Help needed moving from lying on your back to sitting on the side of a flat bed without using bedrails?: None Help needed moving to and from a bed to a chair (including a wheelchair)?: A Little Help needed standing up from a chair using your arms (e.g., wheelchair or bedside chair)?: A Little Help needed to walk in hospital room?: A Little Help needed climbing 3-5 steps with a railing? : A Little 6 Click Score: 20    End of Session Equipment Utilized During Treatment: Gait belt;Oxygen Activity Tolerance: Patient limited by fatigue Patient left: with call bell/phone within reach;in chair;with chair alarm set Nurse Communication: Mobility status PT Visit Diagnosis: Muscle weakness (generalized) (M62.81)     Time: 9371-6967 PT Time Calculation (min) (ACUTE ONLY): 18 min  Charges:  $Gait Training: 8-22 mins                     Heather [name],PT Acute Rehab Services [phone] (pager)   Heather [name] 07/31/2020, 11:19 AM

## 2020-07-31 NOTE — Plan of Care (Signed)
  Problem: Coping: Goal: Level of anxiety will decrease Outcome: Progressing   Problem: Pain Managment: Goal: General experience of comfort will improve Outcome: Progressing   Problem: Activity: Goal: Risk for activity intolerance will decrease 07/31/2020 2124 by Oletha Cruel I, RN Outcome: Not Progressing SOB with exertion

## 2020-08-01 DIAGNOSIS — R509 Fever, unspecified: Secondary | ICD-10-CM

## 2020-08-01 LAB — CBC WITH DIFFERENTIAL/PLATELET
Abs Immature Granulocytes: 0.08 10*3/uL — ABNORMAL HIGH (ref 0.00–0.07)
Basophils Absolute: 0 10*3/uL (ref 0.0–0.1)
Basophils Relative: 0 %
Eosinophils Absolute: 0 10*3/uL (ref 0.0–0.5)
Eosinophils Relative: 0 %
HCT: 37.2 % (ref 36.0–46.0)
Hemoglobin: 12.4 g/dL (ref 12.0–15.0)
Immature Granulocytes: 1 %
Lymphocytes Relative: 11 %
Lymphs Abs: 1.4 10*3/uL (ref 0.7–4.0)
MCH: 32 pg (ref 26.0–34.0)
MCHC: 33.3 g/dL (ref 30.0–36.0)
MCV: 96.1 fL (ref 80.0–100.0)
Monocytes Absolute: 0.7 10*3/uL (ref 0.1–1.0)
Monocytes Relative: 6 %
Neutro Abs: 10.3 10*3/uL — ABNORMAL HIGH (ref 1.7–7.7)
Neutrophils Relative %: 82 %
Platelets: 328 10*3/uL (ref 150–400)
RBC: 3.87 MIL/uL (ref 3.87–5.11)
RDW: 13.7 % (ref 11.5–15.5)
WBC: 12.5 10*3/uL — ABNORMAL HIGH (ref 4.0–10.5)
nRBC: 0 % (ref 0.0–0.2)

## 2020-08-01 LAB — BASIC METABOLIC PANEL
Anion gap: 8 (ref 5–15)
BUN: 21 mg/dL (ref 8–23)
CO2: 29 mmol/L (ref 22–32)
Calcium: 8 mg/dL — ABNORMAL LOW (ref 8.9–10.3)
Chloride: 102 mmol/L (ref 98–111)
Creatinine, Ser: 0.48 mg/dL (ref 0.44–1.00)
GFR, Estimated: >60 mL/min (ref >60)
Glucose, Bld: 121 mg/dL — ABNORMAL HIGH (ref 70–99)
Potassium: 3.7 mmol/L (ref 3.5–5.1)
Sodium: 139 mmol/L (ref 135–145)

## 2020-08-01 LAB — LEGIONELLA PNEUMOPHILA SEROGP 1 UR AG: L. pneumophila Serogp 1 Ur Ag: NEGATIVE

## 2020-08-01 MED ORDER — AZITHROMYCIN 250 MG PO TABS
500.0000 mg | ORAL_TABLET | Freq: Every day | ORAL | Status: DC
  Administered 2020-08-01: 500 mg via ORAL
  Filled 2020-08-01: qty 2

## 2020-08-01 NOTE — Evaluation (Signed)
Clinical/Bedside Swallow Evaluation Patient Details  Name: Heather [name] MRN: [id number] Date of Birth: [birth year]  Today's Date: 08/01/2020 Time: SLP Start Time (ACUTE ONLY): 1013 SLP Stop Time (ACUTE ONLY): 1037 SLP Time Calculation (min) (ACUTE ONLY): 24.03 min  Past Medical History:  Past Medical History:  Diagnosis Date  . Anxiety   . Arthritis   . Atrial fibrillation ([place])   . Cerumen impaction   . COPD (chronic obstructive pulmonary disease) ([place])   . Dysrhythmia    AF  . Essential hypertension, benign   . Gallstones   . GERD (gastroesophageal reflux disease)   . H/O hiatal hernia   . History of nuclear stress test 10/2010   dipyridamole; normal pattern of perfusion; low risk, normal study  . 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 Mild aortic insufficiency   . Osteoporosis   . Peripheral neuropathy   . Pneumonia    states she had it twice, last time a couple of months ago  . PONV (postoperative nausea and vomiting)   . Shortness of breath    with exertion  . Unspecified hypothyroidism   . Venous insufficiency    Past Surgical History:  Past Surgical History:  Procedure Laterality Date  . APPENDECTOMY  1935  . BACK SURGERY     x2  . Cardiometablic Testing  6/94/8546   submaximal effort with peak RER of 0.5, peak VO2 79% predicted; HR peak up to 78%; PVC was 55% predicted, PEV1 39% predicted; PEV1/VC ratio was reduced; normal vital capacity; DLCO was reduced to 63%  . CARPAL TUNNEL RELEASE    . CATARACT EXTRACTION W/ INTRAOCULAR LENS  IMPLANT, BILATERAL    . CHOLECYSTECTOMY  04/07/2014   dr toth  . CHOLECYSTECTOMY N/A 04/07/2014   Procedure: LAPAROSCOPIC CHOLECYSTECTOMY WITH INTRAOPERATIVE CHOLANGIOGRAM POSSBILE OPEN;  Surgeon: Autumn Messing III, MD;  Location: [place];  Service: General;  Laterality: N/A;  . COLON SURGERY  2008   partial  . ESOPHAGOGASTRODUODENOSCOPY (EGD) WITH PROPOFOL N/A 05/25/2017   Procedure:  ESOPHAGOGASTRODUODENOSCOPY (EGD) WITH PROPOFOL;  Surgeon: Clarene Essex, MD;  Location: [place];  Service: Endoscopy;  Laterality: N/A;  . HERNIA REPAIR    . JOINT REPLACEMENT    . left hip relacement  2000  . SAVORY DILATION N/A 05/25/2017   Procedure: SAVORY DILATION;  Surgeon: Clarene Essex, MD;  Location: [facility] ENDOSCOPY;  Service: Endoscopy;  Laterality: N/A;  . TONSILLECTOMY  1935  . TOTAL ABDOMINAL HYSTERECTOMY  1970  . TRANSTHORACIC ECHOCARDIOGRAM  05/2011   EF=>55%; mild conc LVH; mild mitral annular calcif; mild TR; AV mildly sclerotic & mild AR  . VENTRAL HERNIA REPAIR  09/19/2011   Procedure: LAPAROSCOPIC VENTRAL HERNIA;  Surgeon: Luella Cook III, MD;  Location: [place];  Service: General;  Laterality: N/A;  laparoscopic ventral hernia repair with mesh   HPI:  Pt is a 87 y.o. female with medical history significant for atrial fibrillation on Eliquis, COPD, chronic respiratory failure, osteoporosis, hypothyroidism, esophageal dilation, and AAA who presented with complaints of left-sided chest pain and shortness of breath. CT chest 5/1: New focal airspace consolidation in the posterior lingula is suspicious for pneumonia. Interval progression of the calcified right middle lobe pulmonary  nodule, now measuring up to 3.2 cm. Associated progression of a 2 cm  short axis calcified subcarinal lymph node, concerning for metastatic disease. CT chest: adjacent collapse/consolidative opacity in the right lower lobe with evidence of right lower lobe airway impaction. Aspiration would be a consideration. Esophagram  05/04/20: No evidence of laryngeal penetration or tracheobronchial  aspiration. Mild barium retention in the vallecula. Mild cricopharyngeus muscle dysfunction, characteristic of  chronic gastroesophageal reflux disease, unchanged.  Mild presbyesophagus, unchanged.  No gross evidence of esophageal mass or stricture. Laryngoscopy 05/22/20: no obvious abnormalities noted of the mucosal membranes.    Assessment / Plan / Recommendation Clinical Impression  Pt was seen for bedside swallow evaluation. She reported symptoms of pharyngeal and esophageal dysphagia characterized by globus sensation with solids and liquids. Pt identified the sternal notch as the site of the sensation and she stated that she has been consuming soft solids at home and using an effortful swallow with secondary swallows which together provide some relief. Oral mechanism exam was [facility] and dentition was adequate. She demonstrated an effort swallow with all consistencies and multiple swallows were noted with even smaller boluses. Pt exhibited the most difficulty with more advanced solids which she stated required liquids to help with pharyngeal clearance. Pt's diet will be modified to dysphagia 3 solids and thin liquids. No s/sx of aspiration were noted during the evaluation, but pt's  cricopharyngeal dysfunction could place her at increased risk of aspiration. SLP will follow to assess diet tolerance and for instrumental assessment if clinically indicated. SLP Visit Diagnosis: Dysphagia, unspecified (R13.10)    Aspiration Risk  Mild aspiration risk;Moderate aspiration risk    Diet Recommendation Dysphagia 3 (Mech soft);Thin liquid   Liquid Administration via: Cup;Straw Medication Administration: Whole meds with puree Supervision: Patient able to self feed;Intermittent supervision to cue for compensatory strategies Compensations: Slow rate;Small sips/bites;Follow solids with liquid Postural Changes: Seated upright at 90 degrees    Other  Recommendations Oral Care Recommendations: Oral care BID   Follow up Recommendations  (TBD)      Frequency and Duration min 2x/week  1 week       Prognosis Prognosis for Safe Diet Advancement: Fair Barriers to Reach Goals:  (esophageal involvement)      Swallow Study   General Date of Onset: 07/31/20 HPI: Pt is a 87 y.o. female with medical history significant for atrial  fibrillation on Eliquis, COPD, chronic respiratory failure, osteoporosis, hypothyroidism, esophageal dilation, and AAA who presented with complaints of left-sided chest pain and shortness of breath. CT chest 5/1: New focal airspace consolidation in the posterior lingula is suspicious for pneumonia. Interval progression of the calcified right middle lobe pulmonary  nodule, now measuring up to 3.2 cm. Associated progression of a 2 cm  short axis calcified subcarinal lymph node, concerning for metastatic disease. CT chest: adjacent collapse/consolidative opacity in the right lower lobe with evidence of right lower lobe airway impaction. Aspiration would be a consideration. Esophagram 05/04/20: No evidence of laryngeal penetration or tracheobronchial  aspiration. Mild barium retention in the vallecula. Mild cricopharyngeus muscle dysfunction, characteristic of  chronic gastroesophageal reflux disease, unchanged.  Mild presbyesophagus, unchanged.  No gross evidence of esophageal mass or stricture. Laryngoscopy 05/22/20: no obvious abnormalities noted of the mucosal membranes. Type of Study: Bedside Swallow Evaluation Previous Swallow Assessment: none Diet Prior to this Study: Regular;Thin liquids Temperature Spikes Noted: No Respiratory Status: Nasal cannula History of Recent Intubation: No Behavior/Cognition: Alert;Cooperative;Pleasant mood Oral Cavity Assessment: Within Functional Limits Oral Care Completed by SLP: No Oral Cavity - Dentition: Adequate natural dentition Vision: Functional for self-feeding Self-Feeding Abilities: Able to feed self Patient Positioning: Upright in bed;Postural control adequate for testing Baseline Vocal Quality: Normal Volitional Cough: Weak Volitional Swallow: Able to elicit    Oral/Motor/Sensory Function Overall Oral Motor/Sensory Function:  Within functional limits   Ice Chips Ice chips: Within functional limits Presentation: Spoon   Thin Liquid Thin Liquid: Within  functional limits Presentation: Straw;Cup    Nectar Thick Nectar Thick Liquid: Not tested   Honey Thick Honey Thick Liquid: Not tested   Puree Puree: Impaired Presentation: Spoon Pharyngeal Phase Impairments: Multiple swallows   Solid     Solid: Impaired Presentation: Self Fed Pharyngeal Phase Impairments: Multiple swallows     Titilayo Hagans I. Hardin Negus, [place], [place] Office number [phone] Pager [place] 08/01/2020,11:02 AM

## 2020-08-01 NOTE — Progress Notes (Signed)
SATURATION QUALIFICATIONS: (This note is used to comply with regulatory documentation for home oxygen)  Patient Saturations on Room Air at Rest = 96%  Patient Saturations on Room Air while Ambulating = 88%  Patient Saturations on 2  Liters of oxygen while Ambulating = 90 %  Please briefly explain why patient needs home oxygen:  Pt uses home oxygen at home with exertion

## 2020-08-01 NOTE — Progress Notes (Signed)
PROGRESS NOTE    Heather [name]  GYF:[id number] DOB: 1927/02/25 DOA: 07/29/2020 PCP: Burnard Bunting, MD    Brief Narrative:  87 y.o.femalewith medical history significant ofatrial fibrillation on Eliquis, COPD, chronic respiratory failure on as needed oxygen requirement with 2 to 3 L, osteoporosis, hypothyroidism, and AAA who presented with complaints of left-sided chest pain and shortness of breath, found to have PNA with copd exacerbation  Assessment & Plan:   Principal Problem:   Sepsis ([place]) Active Problems:   PAF (paroxysmal atrial fibrillation) ([place])   Long term current use of anticoagulant therapy   CAP (community acquired pneumonia)   Hypothyroidism   Pulmonary nodule   Acute on chronic respiratory failure with hypoxia (HCC)   Elevated troponin   History of rib fracture  Acute on chronic hypoxic respiratory failure and sepsis 2/2 Pneumonia/acute COPD exacerbation: Pt reportedly uses 2 to 3 L of oxygen via nasal cannula during the day.  Patient earlier noted to have temperature of 100.3 F, tachycardic, and tachypneic with WBC elevated 24.2.  Chest x-ray showed chronic lung disease with emphysema with no pneumonia however follow-up CT chest without contrast showed pneumonia.   COVID-19 and influenza screening were both negative.   Procalcitonin subsequently was significantly elevated at 39.77.   -Pt is currently afebrile.  Leukocytosis has improved.  Although she states that she is a little better compared to yesterday however she is visibly dyspneic again but looks slightly improved compared to yesterday.  -ddimer is neg - Continue Rocephin and Zithromax.  All cultures negative so far.   -O2 weaning trials noted. Will require 2LNC on ambulating, seems to be baseline - Continue bronchodilators and prednisone.  Left chest wall pain history of rib and thoracic fractures:    -Lateral rib series noted chronic appearing left sixth through ninth rib fractures and thoracic  fractures. -Lidocaine patch to chest wall. -Seems stable at this time  Elevated troponin: Acute.   -Slightly elevated 23> 21, flat.  Likely demand ischemia. -denied chest pain this AM  Paroxysmal atrial fibrillation on chronic anticoagulation:  -Controlled.  Continue Eliquis and diltiazem  Essential hypertension:  - BP stable and controlled - Continue diltiazem and furosemide. Losartan on hold.  Chronic diastolic congestive heart failure:   - BNP 79.  Appears euvolemic or slightly dry.  Hypothyroidism:  -Continue Synthroid as tolerated  AAA:  - Patient previously noted to have a 3.9 cm ascending thoracic aortic aneurysm last noted from CT scan of the chest from 04/2019.              Pulmonary nodule:  -Patient with known right pulmonary nodule has been gradually increasing in size thought to be possibly malignant in nature.  DVT prophylaxis: Eliquis Code Status: DNR Family Communication: Pt in room, family is not at bedside  Status is: Inpatient  Remains inpatient appropriate because:Inpatient level of care appropriate due to severity of illness   Dispo: The patient is from: Home              Anticipated d/c is to: Home              Patient currently is not medically stable to d/c.   Difficult to place patient No       Consultants:     Procedures:     Antimicrobials: Anti-infectives (From admission, onward)   Start     Dose/Rate Route Frequency Ordered Stop   08/01/20 1800  azithromycin (ZITHROMAX) tablet 500 mg  500 mg Oral Daily-1800 08/01/20 0940 08/03/20 1759   07/29/20 1730  cefTRIAXone (ROCEPHIN) 2 g in sodium chloride 0.9 % 100 mL IVPB        2 g 200 mL/hr over 30 Minutes Intravenous Every 24 hours 07/29/20 1631 08/03/20 1729   07/29/20 1730  azithromycin (ZITHROMAX) 500 mg in sodium chloride 0.9 % 250 mL IVPB  Status:  Discontinued        500 mg 250 mL/hr over 60 Minutes Intravenous Every 24 hours 07/29/20 1631 08/01/20 0940    07/29/20 0645  vancomycin (VANCOREADY) IVPB 1000 mg/200 mL        1,000 mg 200 mL/hr over 60 Minutes Intravenous  Once 07/29/20 0637 07/29/20 0813   07/29/20 0645  piperacillin-tazobactam (ZOSYN) IVPB 3.375 g        3.375 g 12.5 mL/hr over 240 Minutes Intravenous  Once 07/29/20 0637 07/29/20 0717       Subjective: Reports feeling somewhat better  Objective: Vitals:   08/01/20 0816 08/01/20 0848 08/01/20 1209 08/01/20 1534  BP:  121/71 118/64 119/74  Pulse: 72 78 66   Resp: 18 19 18    Temp:  98.5 F (36.9 C) 97.9 F (36.6 C) 98.2 F (36.8 C)  TempSrc:  Oral Oral Oral  SpO2: 99% 100% 100% 98%  Weight:      Height:        Intake/Output Summary (Last 24 hours) at 08/01/2020 1741 Last data filed at 08/01/2020 1100 Gross per 24 hour  Intake 240 ml  Output 1300 ml  Net -1060 ml   Filed Weights   07/29/20 0530 07/30/20 0500  Weight: 47.6 kg 51.2 kg    Examination: General exam: Awake, laying in bed, in nad Respiratory system: Normal respiratory effort, no wheezing Cardiovascular system: regular rate, s1, s2 Gastrointestinal system: Soft, nondistended, positive BS Central nervous system: CN2-12 grossly intact, strength intact Extremities: Perfused, no clubbing Skin: Normal skin turgor, no notable skin lesions seen Psychiatry: Mood normal // no visual hallucinations   Data Reviewed: I have personally reviewed following labs and imaging studies  CBC: Recent Labs  Lab 07/29/20 0543 07/30/20 0200 07/31/20 0402 08/01/20 0327  WBC 24.2* 16.0* 15.0* 12.5*  NEUTROABS 20.9*  --  13.7* 10.3*  HGB 13.8 12.8 12.7 12.4  HCT 41.6 38.2 38.1 37.2  MCV 97.0 95.7 95.5 96.1  PLT 264 240 275 412   Basic Metabolic Panel: Recent Labs  Lab 07/29/20 0543 07/30/20 0200 07/31/20 0402 08/01/20 0327  NA 134* 135 139 139  K 3.6 3.4* 3.8 3.7  CL 93* 96* 99 102  CO2 33* 29 33* 29  GLUCOSE 152* 203* 195* 121*  BUN 20 19 20 21   CREATININE 0.69 0.80 0.70 0.48  CALCIUM 8.8* 8.4* 8.3*  8.0*  MG  --  2.2  --   --    GFR: Estimated Creatinine Clearance: 37 mL/min (by C-G formula based on SCr of 0.48 mg/dL). Liver Function Tests: Recent Labs  Lab 07/29/20 0543 07/30/20 0200  AST 27 24  ALT 20 23  ALKPHOS 61 56  BILITOT 1.9* 0.7  PROT 5.8* 5.6*  ALBUMIN 3.4* 2.9*   No results for input(s): LIPASE, AMYLASE in the last 168 hours. No results for input(s): AMMONIA in the last 168 hours. Coagulation Profile: No results for input(s): INR, PROTIME in the last 168 hours. Cardiac Enzymes: No results for input(s): CKTOTAL, CKMB, CKMBINDEX, TROPONINI in the last 168 hours. BNP (last 3 results) No results for input(s): PROBNP in the  last 8760 hours. HbA1C: No results for input(s): HGBA1C in the last 72 hours. CBG: No results for input(s): GLUCAP in the last 168 hours. Lipid Profile: No results for input(s): CHOL, HDL, LDLCALC, TRIG, CHOLHDL, LDLDIRECT in the last 72 hours. Thyroid Function Tests: No results for input(s): TSH, T4TOTAL, FREET4, T3FREE, THYROIDAB in the last 72 hours. Anemia Panel: No results for input(s): VITAMINB12, FOLATE, FERRITIN, TIBC, IRON, RETICCTPCT in the last 72 hours. Sepsis Labs: Recent Labs  Lab 07/29/20 0544 07/29/20 0744 07/29/20 1053 07/31/20 1139  PROCALCITON  --   --  39.77 46.06  LATICACIDVEN 1.0 1.0  --   --     Recent Results (from the past 240 hour(s))  Culture, blood (routine x 2)     Status: None (Preliminary result)   Collection Time: 07/29/20  5:35 AM   Specimen: BLOOD RIGHT WRIST  Result Value Ref Range Status   Specimen Description BLOOD RIGHT WRIST  Final   Special Requests   Final    BOTTLES DRAWN AEROBIC AND ANAEROBIC Blood Culture adequate volume   Culture   Final    NO GROWTH 3 DAYS Performed at [facility], [place] [address], [place], [place] [address]    Report Status PENDING  Incomplete  Resp Panel by RT-PCR (Flu A&B, Covid) Nasopharyngeal Swab     Status: None   Collection Time: 07/29/20  5:44 AM    Specimen: Nasopharyngeal Swab; Nasopharyngeal(NP) swabs in vial transport medium  Result Value Ref Range Status   SARS Coronavirus 2 by RT PCR NEGATIVE NEGATIVE Final    Comment: (NOTE) SARS-CoV-2 target nucleic acids are NOT DETECTED.  The SARS-CoV-2 RNA is generally detectable in upper respiratory specimens during the acute phase of infection. The lowest concentration of SARS-CoV-2 viral copies this assay can detect is 138 copies/mL. A negative result does not preclude SARS-Cov-2 infection and should not be used as the sole basis for treatment or other patient management decisions. A negative result may occur with  improper specimen collection/handling, submission of specimen other than nasopharyngeal swab, presence of viral mutation(s) within the areas targeted by this assay, and inadequate number of viral copies(<138 copies/mL). A negative result must be combined with clinical observations, patient history, and epidemiological information. The expected result is Negative.  Fact Sheet for Patients:  EntrepreneurPulse.com.au  Fact Sheet for Healthcare Providers:  IncredibleEmployment.be  This test is no t yet approved or cleared by the [place] FDA and  has been authorized for detection and/or diagnosis of SARS-CoV-2 by FDA under an Emergency Use Authorization (EUA). This EUA will remain  in effect (meaning this test can be used) for the duration of the COVID-19 declaration under Section 564(b)(1) of the Act, 21 U.S.C.section 360bbb-3(b)(1), unless the authorization is terminated  or revoked sooner.       Influenza A by PCR NEGATIVE NEGATIVE Final   Influenza B by PCR NEGATIVE NEGATIVE Final    Comment: (NOTE) The Xpert Xpress SARS-CoV-2/FLU/RSV plus assay is intended as an aid in the diagnosis of influenza from Nasopharyngeal swab specimens and should not be used as a sole basis for treatment. Nasal washings and aspirates are  unacceptable for Xpert Xpress SARS-CoV-2/FLU/RSV testing.  Fact Sheet for Patients: EntrepreneurPulse.com.au  Fact Sheet for Healthcare Providers: IncredibleEmployment.be  This test is not yet approved or cleared by the [place] FDA and has been authorized for detection and/or diagnosis of SARS-CoV-2 by FDA under an Emergency Use Authorization (EUA). This EUA will remain in effect (meaning this  test can be used) for the duration of the COVID-19 declaration under Section 564(b)(1) of the Act, 21 U.S.C. section 360bbb-3(b)(1), unless the authorization is terminated or revoked.  Performed at [facility], [place] [address] [place], [place] [address]   Culture, blood (routine x 2)     Status: None (Preliminary result)   Collection Time: 07/29/20  5:49 AM   Specimen: BLOOD RIGHT HAND  Result Value Ref Range Status   Specimen Description BLOOD RIGHT HAND  Final   Special Requests   Final    BOTTLES DRAWN AEROBIC AND ANAEROBIC Blood Culture results may not be optimal due to an inadequate volume of blood received in culture bottles   Culture   Final    NO GROWTH 3 DAYS Performed at [facility], [place] [address], [place], [place] [address]    Report Status PENDING  Incomplete     Radiology Studies: No results found.  Scheduled Meds: . acidophilus  1 capsule Oral Daily  . apixaban  2.5 mg Oral BID  . arformoterol  15 mcg Nebulization BID  . azithromycin  500 mg Oral q1800  . budesonide (PULMICORT) nebulizer solution  0.5 mg Nebulization BID  . diltiazem  180 mg Oral Daily  . feeding supplement  237 mL Oral BID BM  . fluticasone  2 spray Each Nare QPM  . furosemide  40 mg Oral Daily  . levothyroxine  137 mcg Oral QAC breakfast  . lidocaine  1 patch Transdermal Q24H  . magnesium oxide  400 mg Oral Daily  . polyethylene glycol  17 g Oral Daily  . predniSONE  10 mg Oral Q breakfast   Continuous Infusions: . cefTRIAXone  (ROCEPHIN)  IV 2 g (08/01/20 1645)     LOS: 3 days   Marylu Lund, MD [facility] Pager On Amion  If 7PM-7AM, please contact night-coverage 08/01/2020, 5:41 PM

## 2020-08-01 NOTE — Progress Notes (Signed)
Occupational Therapy Treatment Patient Details Name: Heather [name] MRN: [id number] DOB: 1927-02-12 Today's Date: 08/01/2020    History of present illness Pt is a 87 year old woman admitted with chest pain and SOB, + sepsis. PMH: COPD on 2-4 L 02 at baseline, afib, osteoporosis, hypothyroidism, AAA, chronic LE swelling, peripheral neuropathy.   OT comments  Pt progressing to OOB ADL and sitting in recliner to conclude session. Pt educated in breathing techniques and energy conservation strategies. Pt unable to recall from Monday's OT eval for energy conseravtion techniques, but reported that she had heard about them recently. Pt would benefit from continued OT skilled services. OT following acutely.   Follow Up Recommendations  No OT follow up    Equipment Recommendations  None recommended by OT    Recommendations for Other Services      Precautions / Restrictions Precautions Precautions: Fall Precaution Comments: dyspnea with minimal exertion Restrictions Weight Bearing Restrictions: No       Mobility Bed Mobility Overal bed mobility: Needs Assistance Bed Mobility: Supine to Sit;Sit to Supine     Supine to sit: Supervision Sit to supine: Supervision        Transfers Overall transfer level: Needs assistance Equipment used: Rolling walker (2 wheeled) Transfers: Sit to/from [facility] Sit to Stand: Min guard Stand pivot transfers: Min guard       General transfer comment: for safety due to lines/RW    Balance Overall balance assessment: Needs assistance Sitting-balance support: No upper extremity supported;Feet supported Sitting balance-Leahy Scale: Good     Standing balance support: Bilateral upper extremity supported Standing balance-Leahy Scale: Poor Standing balance comment: reliant on RW                           ADL either performed or assessed with clinical judgement   ADL Overall ADL's : Needs assistance/impaired      Grooming: Supervision/safety;Standing Grooming Details (indicate cue type and reason): stood x5 mins                             Functional mobility during ADLs: Min guard;Rolling walker General ADL Comments: Pt educated in breathing techniques and energy conservation strategies. Pt unable to recall from Monday's OT eval any techniques, but reported that she had heard about them recently.     Vision   Vision Assessment?: No apparent visual deficits   Perception     Praxis      Cognition Arousal/Alertness: Awake/alert Behavior During Therapy: WFL for tasks assessed/performed Overall Cognitive Status: Within Functional Limits for tasks assessed                                          Exercises Exercises: Other exercises Other Exercises Other Exercises: pursed lip breathing   Shoulder Instructions       General Comments Pt wears 2L O2 at all times; O2 on 2L for session and  >90%.    Pertinent Vitals/ Pain       Pain Assessment: No/denies pain  Home Living                                          Prior Functioning/Environment  Frequency  Min 2X/week        Progress Toward Goals  OT Goals(current goals can now be found in the care plan section)  Progress towards OT goals: Progressing toward goals  Acute Rehab OT Goals Patient Stated Goal: to breathe easier, return home OT Goal Formulation: With patient Time For Goal Achievement: 08/13/20 Potential to Achieve Goals: Good ADL Goals Pt Will Perform Grooming: with modified independence;standing;sitting Pt Will Transfer to Toilet: with modified independence;ambulating;bedside commode Pt Will Perform Toileting - Clothing Manipulation and hygiene: with modified independence;sit to/from stand Additional ADL Goal #1: Pt will gather items around her room with rollator modified independently.  Plan Discharge plan remains appropriate     Co-evaluation                 AM-PAC OT "6 Clicks" Daily Activity     Outcome Measure   Help from another person eating meals?: None Help from another person taking care of personal grooming?: A Little Help from another person toileting, which includes using toliet, bedpan, or urinal?: A Little Help from another person bathing (including washing, rinsing, drying)?: A Little Help from another person to put on and taking off regular upper body clothing?: None Help from another person to put on and taking off regular lower body clothing?: A Little 6 Click Score: 20    End of Session Equipment Utilized During Treatment: Gait belt;Rolling walker;Oxygen  OT Visit Diagnosis: Other abnormalities of gait and mobility (R26.89);Unsteadiness on feet (R26.81);Muscle weakness (generalized) (M62.81);Other (comment)   Activity Tolerance Patient limited by fatigue;Patient tolerated treatment well   Patient Left in chair;with call bell/phone within reach   Nurse Communication Mobility status        Time: 1212-1230 OT Time Calculation (min): 18 min  Charges: OT General Charges $OT Visit: 1 Visit OT Treatments $Self Care/Home Management : 8-22 mins  Jefferey Pica, OTR/L Acute Rehabilitation Services Pager: [phone] Office: [phone]   Sabas Frett C 08/01/2020, 5:23 PM

## 2020-08-02 ENCOUNTER — Other Ambulatory Visit (HOSPITAL_COMMUNITY): Payer: Self-pay

## 2020-08-02 DIAGNOSIS — J9621 Acute and chronic respiratory failure with hypoxia: Secondary | ICD-10-CM | POA: Diagnosis not present

## 2020-08-02 DIAGNOSIS — R778 Other specified abnormalities of plasma proteins: Secondary | ICD-10-CM | POA: Diagnosis not present

## 2020-08-02 MED ORDER — CEFDINIR 300 MG PO CAPS
300.0000 mg | ORAL_CAPSULE | Freq: Two times a day (BID) | ORAL | 0 refills | Status: AC
  Filled 2020-08-02: qty 2, 1d supply, fill #0

## 2020-08-02 MED ORDER — AZITHROMYCIN 250 MG PO TABS
ORAL_TABLET | ORAL | 0 refills | Status: DC
  Filled 2020-08-02: qty 1, 1d supply, fill #0

## 2020-08-02 NOTE — TOC Transition Note (Signed)
Transition of Care [facility]) - CM/SW Discharge Note   Patient Details  Name: CAMELLIA POPESCU MRN: [id number] Date of Birth: [birth year]  Transition of [facility]) CM/SW Contact:  Angelita Ingles, RN Phone Number: [phone]  08/02/2020, 12:18 PM   Clinical Narrative:    Patient is discharging home with [facility]. HH has been set up with [facility] per patients request. Daughter to bring O2 tank for transport. No other needs noted at this time. TOC will sign off.    Final next level of care: [place] Barriers to Discharge: No Barriers Identified   Patient Goals and CMS Choice Patient states their goals for this hospitalization and ongoing recovery are:: Ready to go home CMS Medicare.gov Compare Post Acute Care list provided to:: Patient Represenative (must comment) Choice offered to / list presented to : Adult Children  Discharge Placement                       Discharge Plan and Services                DME Arranged: N/A DME Agency: NA       HH Arranged: PT HH Agency: [facility] Jackquline [place]) Date [place]: 08/02/20 Time [place]: 1216 Representative spoke with at [place]: [place]) Interventions     Readmission Risk Interventions No flowsheet data found.

## 2020-08-02 NOTE — Discharge Summary (Signed)
Physician Discharge Summary  Heather [name] KWI:[id number] DOB: 1927-03-08 DOA: 07/29/2020  PCP: Burnard Bunting, MD  Admit date: 07/29/2020 Discharge date: 08/02/2020  Admitted From: Home Disposition:  Home  Recommendations for Outpatient Follow-up:  1. Follow up with PCP in 1-2 weeks  Home Health:PT   Discharge Condition:Improved CODE STATUS:DNR Diet recommendation: Heart healthy   Brief/Interim Summary: 87 y.o.femalewith medical history significant ofatrial fibrillation on Eliquis, COPD, chronic respiratory failureon as needed oxygen requirement with 2 to 3 L, osteoporosis, hypothyroidism, and AAA who presentedwith complaints of left-sided chest pain and shortness of breath, found to have PNA with copd exacerbation  Discharge Diagnoses:  Principal Problem:   Sepsis ([place]) Active Problems:   PAF (paroxysmal atrial fibrillation) ([place])   Long term current use of anticoagulant therapy   CAP (community acquired pneumonia)   Hypothyroidism   Pulmonary nodule   Acute on chronic respiratory failure with hypoxia (HCC)   Elevated troponin   History of rib fracture  Acute on chronic hypoxic respiratory failure and sepsis 2/2 Pneumonia/acute COPD exacerbation: Pt reportedly uses 2 to 3 L of oxygen via nasal cannula during the day. Patient earlier noted to have temperature of 100.3 F, tachycardic, and tachypneic with WBC elevated 24.2. Chest x-ray showed chronic lung disease with emphysema with no pneumonia however follow-up CT chest without contrast showed pneumonia. COVID-19 and influenza screening were both negative. Procalcitonin subsequently was significantly elevated at 39.77.  -Pt has since remained afebrile. Leukocytosis has improved.   -ddimer is neg -Continue Rocephin and Zithromax. All cultures negative so far.  -O2 weaning trials noted. Will require 2LNC on ambulating, seems to be baseline -Continue bronchodilators and prednisone.  Left chest wall pain  history of rib and thoracic fractures:  -Lateral rib series noted chronic appearing left sixth through ninth rib fractures and thoracic fractures. -Lidocaine patch to chest wall. -Seems stable at this time  Elevated troponin: Acute.  -Slightly elevated 23>21, flat. Likely demand ischemia. -denied chest pain this AM  Paroxysmal atrial fibrillation on chronic anticoagulation:  -Controlled. Continue Eliquis and diltiazem  Essential hypertension: - BP stable and controlled -Continue diltiazem and furosemide. Losartan on hold.  Chronic diastolic congestive heart failure:  - BNP 79. Appears euvolemic or slightly dry.  Hypothyroidism:  -Continue Synthroid as tolerated  AAA:  - Patient previously noted to have a 3.9 cm ascending thoracic aortic aneurysm last noted from CT scan of the chest from 04/2019.  Pulmonary nodule:  -Patient with known right pulmonary nodule has been gradually increasing in size thought to be possibly malignant in nature. Have discussed with patient and family, all are aware of nodule and is followed by Dr. Lamonte Sakai. Would have pt follow up with Pulmonary as outpatient   Discharge Instructions   Allergies as of 08/02/2020      Reactions   Ace Inhibitors Cough   Elemental Sulfur Nausea And Vomiting      Medication List    STOP taking these medications   Forteo 620 MCG/2.48ML Sopn Generic drug: Teriparatide (Recombinant)   losartan 25 MG tablet Commonly known as: COZAAR     TAKE these medications   albuterol (2.5 MG/3ML) 0.083% nebulizer solution Commonly known as: PROVENTIL Take 2.5 mg by nebulization 2 (two) times daily as needed for wheezing or shortness of breath.   albuterol 108 (90 Base) MCG/ACT inhaler Commonly known as: VENTOLIN HFA Inhale 2 puffs into the lungs every 4 (four) hours as needed for wheezing or shortness of breath.   apixaban 2.5 MG Tabs  tablet Commonly known as: Eliquis Take 1 tablet (2.5 mg total)  by mouth 2 (two) times daily.   azithromycin 250 MG tablet Commonly known as: ZITHROMAX 1 tab po daily x 1 more day, zero refills   BIOGAIA PROBIOTIC PO Take 1 capsule by mouth daily.   Breztri Aerosphere 160-9-4.8 MCG/ACT Aero Generic drug: Budeson-Glycopyrrol-Formoterol Inhale 2 puffs into the lungs in the morning and at bedtime.   budesonide-formoterol 160-4.5 MCG/ACT inhaler Commonly known as: Symbicort Inhale 1 puff into the lungs 2 (two) times daily.   calcium citrate-vitamin D 315-200 MG-UNIT tablet Commonly known as: CITRACAL+D Take 1 tablet by mouth daily.   cefdinir 300 MG capsule Commonly known as: OMNICEF Take 1 capsule (300 mg total) by mouth 2 (two) times daily for 1 day.   cholecalciferol 25 MCG (1000 UNIT) tablet Commonly known as: VITAMIN D3 Take 1,000 Units by mouth daily.   diltiazem 180 MG 24 hr capsule Commonly known as: CARDIZEM CD Take 180 mg by mouth daily.   feeding supplement Liqd Take 237 mLs by mouth 2 (two) times daily between meals.   fluticasone 50 MCG/ACT nasal spray Commonly known as: FLONASE USE 2 SPRAY(S) IN EACH NOSTRIL ONCE DAILY IN THE EVENING What changed: See the new instructions.   furosemide 40 MG tablet Commonly known as: LASIX Take 40 mg by mouth daily.   guaiFENesin-dextromethorphan 100-10 MG/5ML syrup Commonly known as: ROBITUSSIN DM Take 5 mLs by mouth every 6 (six) hours as needed for cough. What changed: how much to take   Klor-Con M20 20 MEQ tablet Generic drug: potassium chloride SA Take 20 mEq by mouth daily.   levothyroxine 137 MCG tablet Commonly known as: SYNTHROID Take 137 mcg by mouth daily before breakfast.   magnesium oxide 400 MG tablet Commonly known as: MAG-OX Take 400 mg by mouth daily.   Melatonin 10 MG Caps Take 10 mg by mouth at bedtime as needed (sleep).   multivitamin with minerals tablet Take 1 tablet by mouth daily.   polyethylene glycol 17 g packet Commonly known as: MIRALAX /  GLYCOLAX Take 17 g by mouth daily.   predniSONE 10 MG tablet Commonly known as: DELTASONE Take 1 tablet (10 mg total) by mouth daily with breakfast.   Spiriva Respimat 2.5 MCG/ACT Aers Generic drug: Tiotropium Bromide Monohydrate Inhale 2 puffs into the lungs daily.   THERATEARS OP Place 2 drops into both eyes daily.       Allergies  Allergen Reactions  . Ace Inhibitors Cough  . Elemental Sulfur Nausea And Vomiting    Procedures/Studies: DG Ribs Unilateral W/Chest Left  Result Date: 07/29/2020 CLINICAL DATA:  87 year old female with left chest pain and shortness of breath since yesterday. On home oxygen. EXAM: LEFT RIBS AND CHEST - 3+ VIEW COMPARISON:  Chest CT 10/13/2019 and earlier. FINDINGS: Chronic pulmonary hyperinflation with emphysema demonstrated by CT last year. Chronic eventration or elevation of the right hemidiaphragm has mildly progressed since 2020 but appears stable since last year. Tortuous thoracic aorta with calcified atherosclerosis. Other mediastinal contours are within normal limits. Visualized tracheal air column is within normal limits. No pneumothorax or pulmonary edema. Left lung base appears clear. Right lung base atelectasis. No air bronchograms identified. Three oblique views of the left ribs. Osteopenia. Chronic left lateral 6th through 9th rib fractures. No acute displaced left rib fracture identified. Multilevel chronic thoracic compression fractures. Previous lumbar interbody fusion. Advanced degeneration at the left glenohumeral joint. No acute osseous abnormality is evident. Negative visible bowel gas  pattern.  Stable cholecystectomy clips. IMPRESSION: 1. Chronic lung disease with Emphysema (ICD10-J43.9). Eventration/elevation of the right hemidiaphragm with right lung base atelectasis has not significantly changed since last year. No acute cardiopulmonary abnormality identified. 2. Chronic left rib fractures and thoracic compression fractures. No acute rib  fracture identified. Electronically Signed   By: Genevie Ann M.D.   On: 07/29/2020 06:40   CT CHEST WO CONTRAST  Result Date: 07/29/2020 CLINICAL DATA:  Chest pain and shortness of breath. EXAM: CT CHEST WITHOUT CONTRAST TECHNIQUE: Multidetector CT imaging of the chest was performed following the standard protocol without IV contrast. COMPARISON:  10/13/2019 FINDINGS: Cardiovascular: The heart size is normal. No substantial pericardial effusion. Coronary artery calcification is evident. Atherosclerotic calcification is noted in the wall of the thoracic aorta. Mediastinum/Nodes: 2 cm short axis calcified lymph node identified in the subcarinal station, new in the interval. No other mediastinal lymphadenopathy evident. No evidence for gross hilar lymphadenopathy although assessment is limited by the lack of intravenous contrast on today's study. The esophagus has normal imaging features. There is no axillary lymphadenopathy. Lungs/Pleura: Moderate to advanced changes of emphysema noted. Focal airspace consolidation posterior lingula is suspicious for pneumonia. Asymmetric elevation right hemidiaphragm with adjacent collapse/consolidative opacity in the right lower lobe with evidence of right lower lobe airway impaction. Subsegmental atelectasis noted in the right middle lobe. Calcified 3.2 x 2.3 cm anterior right middle lobe nodule seen on image 125/4, increased from 2.0 x 1.4 cm previously. 4 mm right lower lobe nodule on 69/8 is stable. Upper Abdomen: Unremarkable. Musculoskeletal: No worrisome lytic or sclerotic osseous abnormality. IMPRESSION: 1. New focal airspace consolidation in the posterior lingula is suspicious for pneumonia. 2. Interval progression of the calcified right middle lobe pulmonary nodule, now measuring up to 3.2 cm. Associated progression of a 2 cm short axis calcified subcarinal lymph node, concerning for metastatic disease. 3. Asymmetric elevation right hemidiaphragm with adjacent  collapse/consolidative opacity in the right lower lobe with evidence of right lower lobe airway impaction. Aspiration would be a consideration. 4. Aortic Atherosclerosis (ICD10-I70.0) and Emphysema (ICD10-J43.9). Electronically Signed   By: Misty Stanley M.D.   On: 07/29/2020 10:00     Subjective: Eager to go home  Discharge Exam: Vitals:   08/02/20 0603 08/02/20 0748  BP: 133/78   Pulse: 77   Resp: 18   Temp: 98.2 F (36.8 C)   SpO2: 98% 98%   Vitals:   08/01/20 1957 08/01/20 2016 08/02/20 0603 08/02/20 0748  BP: 129/84  133/78   Pulse: 69  77   Resp: 19  18   Temp: 98 F (36.7 C)  98.2 F (36.8 C)   TempSrc: Oral  Oral   SpO2: 98% 97% 98% 98%  Weight:      Height:        General: Pt is alert, awake, not in acute distress Cardiovascular: RRR, S1/S2 +, no rubs, no gallops Respiratory: CTA bilaterally, no wheezing, no rhonchi Abdominal: Soft, NT, ND, bowel sounds + Extremities: no edema, no cyanosis   The results of significant diagnostics from this hospitalization (including imaging, microbiology, ancillary and laboratory) are listed below for reference.     Microbiology: Recent Results (from the past 240 hour(s))  Culture, blood (routine x 2)     Status: None (Preliminary result)   Collection Time: 07/29/20  5:35 AM   Specimen: BLOOD RIGHT WRIST  Result Value Ref Range Status   Specimen Description BLOOD RIGHT WRIST  Final   Special Requests  Final    BOTTLES DRAWN AEROBIC AND ANAEROBIC Blood Culture adequate volume   Culture   Final    NO GROWTH 4 DAYS Performed at [facility], [place] [address] [place], [place] [address]    Report Status PENDING  Incomplete  Resp Panel by RT-PCR (Flu A&B, Covid) Nasopharyngeal Swab     Status: None   Collection Time: 07/29/20  5:44 AM   Specimen: Nasopharyngeal Swab; Nasopharyngeal(NP) swabs in vial transport medium  Result Value Ref Range Status   SARS Coronavirus 2 by RT PCR NEGATIVE NEGATIVE Final    Comment:  (NOTE) SARS-CoV-2 target nucleic acids are NOT DETECTED.  The SARS-CoV-2 RNA is generally detectable in upper respiratory specimens during the acute phase of infection. The lowest concentration of SARS-CoV-2 viral copies this assay can detect is 138 copies/mL. A negative result does not preclude SARS-Cov-2 infection and should not be used as the sole basis for treatment or other patient management decisions. A negative result may occur with  improper specimen collection/handling, submission of specimen other than nasopharyngeal swab, presence of viral mutation(s) within the areas targeted by this assay, and inadequate number of viral copies(<138 copies/mL). A negative result must be combined with clinical observations, patient history, and epidemiological information. The expected result is Negative.  Fact Sheet for Patients:  EntrepreneurPulse.com.au  Fact Sheet for Healthcare Providers:  IncredibleEmployment.be  This test is no t yet approved or cleared by the [place] FDA and  has been authorized for detection and/or diagnosis of SARS-CoV-2 by FDA under an Emergency Use Authorization (EUA). This EUA will remain  in effect (meaning this test can be used) for the duration of the COVID-19 declaration under Section 564(b)(1) of the Act, 21 U.S.C.section 360bbb-3(b)(1), unless the authorization is terminated  or revoked sooner.       Influenza A by PCR NEGATIVE NEGATIVE Final   Influenza B by PCR NEGATIVE NEGATIVE Final    Comment: (NOTE) The Xpert Xpress SARS-CoV-2/FLU/RSV plus assay is intended as an aid in the diagnosis of influenza from Nasopharyngeal swab specimens and should not be used as a sole basis for treatment. Nasal washings and aspirates are unacceptable for Xpert Xpress SARS-CoV-2/FLU/RSV testing.  Fact Sheet for Patients: EntrepreneurPulse.com.au  Fact Sheet for Healthcare  Providers: IncredibleEmployment.be  This test is not yet approved or cleared by the [place] FDA and has been authorized for detection and/or diagnosis of SARS-CoV-2 by FDA under an Emergency Use Authorization (EUA). This EUA will remain in effect (meaning this test can be used) for the duration of the COVID-19 declaration under Section 564(b)(1) of the Act, 21 U.S.C. section 360bbb-3(b)(1), unless the authorization is terminated or revoked.  Performed at [facility], [place] [address] [place], [place] [address]   Culture, blood (routine x 2)     Status: None (Preliminary result)   Collection Time: 07/29/20  5:49 AM   Specimen: BLOOD RIGHT HAND  Result Value Ref Range Status   Specimen Description BLOOD RIGHT HAND  Final   Special Requests   Final    BOTTLES DRAWN AEROBIC AND ANAEROBIC Blood Culture results may not be optimal due to an inadequate volume of blood received in culture bottles   Culture   Final    NO GROWTH 4 DAYS Performed at [facility], [place] [address] [place],  [address]    Report Status PENDING  Incomplete     Labs: BNP (last 3 results) Recent Labs    01/23/20 [phone]  02/27/20 0832 07/29/20 0543  BNP 55.0 67.7 68.1   Basic Metabolic Panel: Recent Labs  Lab 07/29/20 0543 07/30/20 0200 07/31/20 0402 08/01/20 0327  NA 134* 135 139 139  K 3.6 3.4* 3.8 3.7  CL 93* 96* 99 102  CO2 33* 29 33* 29  GLUCOSE 152* 203* 195* 121*  BUN 20 19 20 21   CREATININE 0.69 0.80 0.70 0.48  CALCIUM 8.8* 8.4* 8.3* 8.0*  MG  --  2.2  --   --    Liver Function Tests: Recent Labs  Lab 07/29/20 0543 07/30/20 0200  AST 27 24  ALT 20 23  ALKPHOS 61 56  BILITOT 1.9* 0.7  PROT 5.8* 5.6*  ALBUMIN 3.4* 2.9*   No results for input(s): LIPASE, AMYLASE in the last 168 hours. No results for input(s): AMMONIA in the last 168 hours. CBC: Recent Labs  Lab 07/29/20 0543 07/30/20 0200 07/31/20 0402 08/01/20 0327  WBC 24.2* 16.0*  15.0* 12.5*  NEUTROABS 20.9*  --  13.7* 10.3*  HGB 13.8 12.8 12.7 12.4  HCT 41.6 38.2 38.1 37.2  MCV 97.0 95.7 95.5 96.1  PLT 264 240 275 328   Cardiac Enzymes: No results for input(s): CKTOTAL, CKMB, CKMBINDEX, TROPONINI in the last 168 hours. BNP: Invalid input(s): POCBNP CBG: No results for input(s): GLUCAP in the last 168 hours. D-Dimer Recent Labs    07/31/20 1139  DDIMER 0.42   Hgb A1c No results for input(s): HGBA1C in the last 72 hours. Lipid Profile No results for input(s): CHOL, HDL, LDLCALC, TRIG, CHOLHDL, LDLDIRECT in the last 72 hours. Thyroid function studies No results for input(s): TSH, T4TOTAL, T3FREE, THYROIDAB in the last 72 hours.  Invalid input(s): FREET3 Anemia work up No results for input(s): VITAMINB12, FOLATE, FERRITIN, TIBC, IRON, RETICCTPCT in the last 72 hours. Urinalysis    Component Value Date/Time   COLORURINE YELLOW 07/31/2020 1320   APPEARANCEUR CLEAR 07/31/2020 1320   LABSPEC 1.010 07/31/2020 1320   PHURINE 6.0 07/31/2020 1320   GLUCOSEU NEGATIVE 07/31/2020 1320   HGBUR NEGATIVE 07/31/2020 1320   BILIRUBINUR NEGATIVE 07/31/2020 1320   KETONESUR NEGATIVE 07/31/2020 1320   PROTEINUR NEGATIVE 07/31/2020 1320   UROBILINOGEN 0.2 05/17/2013 1207   NITRITE NEGATIVE 07/31/2020 1320   LEUKOCYTESUR NEGATIVE 07/31/2020 1320   Sepsis Labs Invalid input(s): PROCALCITONIN,  WBC,  LACTICIDVEN Microbiology Recent Results (from the past 240 hour(s))  Culture, blood (routine x 2)     Status: None (Preliminary result)   Collection Time: 07/29/20  5:35 AM   Specimen: BLOOD RIGHT WRIST  Result Value Ref Range Status   Specimen Description BLOOD RIGHT WRIST  Final   Special Requests   Final    BOTTLES DRAWN AEROBIC AND ANAEROBIC Blood Culture adequate volume   Culture   Final    NO GROWTH 4 DAYS Performed at [facility], [place] [address] [place], [place] [address]    Report Status PENDING  Incomplete  Resp Panel by RT-PCR (Flu A&B, Covid)  Nasopharyngeal Swab     Status: None   Collection Time: 07/29/20  5:44 AM   Specimen: Nasopharyngeal Swab; Nasopharyngeal(NP) swabs in vial transport medium  Result Value Ref Range Status   SARS Coronavirus 2 by RT PCR NEGATIVE NEGATIVE Final    Comment: (NOTE) SARS-CoV-2 target nucleic acids are NOT DETECTED.  The SARS-CoV-2 RNA is generally detectable in upper respiratory specimens during the acute phase of infection. The lowest concentration of SARS-CoV-2 viral copies this assay can detect is 138 copies/mL.  A negative result does not preclude SARS-Cov-2 infection and should not be used as the sole basis for treatment or other patient management decisions. A negative result may occur with  improper specimen collection/handling, submission of specimen other than nasopharyngeal swab, presence of viral mutation(s) within the areas targeted by this assay, and inadequate number of viral copies(<138 copies/mL). A negative result must be combined with clinical observations, patient history, and epidemiological information. The expected result is Negative.  Fact Sheet for Patients:  EntrepreneurPulse.com.au  Fact Sheet for Healthcare Providers:  IncredibleEmployment.be  This test is no t yet approved or cleared by the [place] FDA and  has been authorized for detection and/or diagnosis of SARS-CoV-2 by FDA under an Emergency Use Authorization (EUA). This EUA will remain  in effect (meaning this test can be used) for the duration of the COVID-19 declaration under Section 564(b)(1) of the Act, 21 U.S.C.section 360bbb-3(b)(1), unless the authorization is terminated  or revoked sooner.       Influenza A by PCR NEGATIVE NEGATIVE Final   Influenza B by PCR NEGATIVE NEGATIVE Final    Comment: (NOTE) The Xpert Xpress SARS-CoV-2/FLU/RSV plus assay is intended as an aid in the diagnosis of influenza from Nasopharyngeal swab specimens and should not be  used as a sole basis for treatment. Nasal washings and aspirates are unacceptable for Xpert Xpress SARS-CoV-2/FLU/RSV testing.  Fact Sheet for Patients: EntrepreneurPulse.com.au  Fact Sheet for Healthcare Providers: IncredibleEmployment.be  This test is not yet approved or cleared by the [place] FDA and has been authorized for detection and/or diagnosis of SARS-CoV-2 by FDA under an Emergency Use Authorization (EUA). This EUA will remain in effect (meaning this test can be used) for the duration of the COVID-19 declaration under Section 564(b)(1) of the Act, 21 U.S.C. section 360bbb-3(b)(1), unless the authorization is terminated or revoked.  Performed at [facility], [place] [address] [place], [place] [address]   Culture, blood (routine x 2)     Status: None (Preliminary result)   Collection Time: 07/29/20  5:49 AM   Specimen: BLOOD RIGHT HAND  Result Value Ref Range Status   Specimen Description BLOOD RIGHT HAND  Final   Special Requests   Final    BOTTLES DRAWN AEROBIC AND ANAEROBIC Blood Culture results may not be optimal due to an inadequate volume of blood received in culture bottles   Culture   Final    NO GROWTH 4 DAYS Performed at [facility], [place] [address] [place], [place] [address]    Report Status PENDING  Incomplete   Time spent: 30 min  SIGNED:   Marylu Lund, MD  [facility] 08/02/2020, 10:49 AM  If 7PM-7AM, please contact night-coverage

## 2020-08-02 NOTE — Plan of Care (Signed)
  Problem: Education: Goal: Knowledge of the prescribed therapeutic regimen will improve Outcome: Progressing   Problem: Activity: Goal: Ability to tolerate increased activity will improve Outcome: Progressing   Problem: Respiratory: Goal: Ability to maintain a clear airway will improve Outcome: Progressing   Problem: Respiratory: Goal: Ability to maintain adequate ventilation will improve Outcome: Progressing

## 2020-08-03 LAB — CULTURE, BLOOD (ROUTINE X 2)
Culture: NO GROWTH
Culture: NO GROWTH
Special Requests: ADEQUATE

## 2020-08-07 ENCOUNTER — Telehealth: Payer: Self-pay | Admitting: *Deleted

## 2020-08-07 NOTE — Telephone Encounter (Signed)
Received form from [facility].  They are trying to assist the pt with help in getting her symbicort.   It looks like the pt was given Breztri in 03/2020.  This request is for Symbicort 160.  RB please advise if you are ok with giving the symbicort again: Papers have been placed in [place] look at.   Thanks

## 2020-08-09 ENCOUNTER — Telehealth: Payer: Self-pay | Admitting: Emergency Medicine

## 2020-08-09 MED ORDER — PREDNISONE 10 MG PO TABS
10.0000 mg | ORAL_TABLET | Freq: Every day | ORAL | 0 refills | Status: DC
Start: 2020-08-09 — End: 2020-08-21

## 2020-08-09 NOTE — Telephone Encounter (Signed)
RX for prednisone sent. Patient's daughter is aware.

## 2020-08-10 NOTE — Telephone Encounter (Signed)
Going to fill paperwork to see how expensive Symbicort will be. Working to determine most cost effective regimen Spiriva/Symbicort versus respiratory

## 2020-08-11 DIAGNOSIS — U071 COVID-19: Secondary | ICD-10-CM | POA: Diagnosis not present

## 2020-08-11 DIAGNOSIS — J449 Chronic obstructive pulmonary disease, unspecified: Secondary | ICD-10-CM | POA: Diagnosis not present

## 2020-08-13 ENCOUNTER — Telehealth: Payer: Self-pay | Admitting: Emergency Medicine

## 2020-08-13 ENCOUNTER — Telehealth: Payer: Self-pay | Admitting: Internal Medicine

## 2020-08-13 NOTE — Telephone Encounter (Signed)
*  STAT* If patient is at the pharmacy, call can be transferred to refill team.   1. Which medications need to be refilled? (please list name of each medication and dose if known) new prescription for Eliquis- changing pharmacy  2. Which pharmacy/location (including street and city if local pharmacy) is medication to be sent to? [facility]  3. Do they need a 30 day or 90 day supply? 90 days and refills

## 2020-08-13 NOTE — Telephone Encounter (Signed)
Attempted to call but [facility] is not closed (after 5:00). Will call back 5/17

## 2020-08-14 ENCOUNTER — Ambulatory Visit: Payer: Medicare HMO | Admitting: Emergency Medicine

## 2020-08-14 ENCOUNTER — Encounter: Payer: Self-pay | Admitting: Emergency Medicine

## 2020-08-14 ENCOUNTER — Other Ambulatory Visit: Payer: Self-pay

## 2020-08-14 VITALS — BP 92/60 | HR 84 | Temp 98.1°F | Ht 63.0 in | Wt 103.8 lb

## 2020-08-14 DIAGNOSIS — I351 Nonrheumatic aortic (valve) insufficiency: Secondary | ICD-10-CM | POA: Diagnosis not present

## 2020-08-14 DIAGNOSIS — R911 Solitary pulmonary nodule: Secondary | ICD-10-CM | POA: Diagnosis not present

## 2020-08-14 DIAGNOSIS — J439 Emphysema, unspecified: Secondary | ICD-10-CM | POA: Diagnosis not present

## 2020-08-14 DIAGNOSIS — I714 Abdominal aortic aneurysm, without rupture: Secondary | ICD-10-CM | POA: Diagnosis not present

## 2020-08-14 DIAGNOSIS — I480 Paroxysmal atrial fibrillation: Secondary | ICD-10-CM | POA: Diagnosis not present

## 2020-08-14 DIAGNOSIS — J449 Chronic obstructive pulmonary disease, unspecified: Secondary | ICD-10-CM | POA: Diagnosis not present

## 2020-08-14 DIAGNOSIS — J189 Pneumonia, unspecified organism: Secondary | ICD-10-CM

## 2020-08-14 DIAGNOSIS — I5032 Chronic diastolic (congestive) heart failure: Secondary | ICD-10-CM | POA: Diagnosis not present

## 2020-08-14 DIAGNOSIS — J9611 Chronic respiratory failure with hypoxia: Secondary | ICD-10-CM

## 2020-08-14 DIAGNOSIS — I110 Hypertensive heart disease with heart failure: Secondary | ICD-10-CM | POA: Diagnosis not present

## 2020-08-14 DIAGNOSIS — J9621 Acute and chronic respiratory failure with hypoxia: Secondary | ICD-10-CM | POA: Diagnosis not present

## 2020-08-14 DIAGNOSIS — A419 Sepsis, unspecified organism: Secondary | ICD-10-CM | POA: Diagnosis not present

## 2020-08-14 MED ORDER — APIXABAN 2.5 MG PO TABS: 2.5000 mg | ORAL_TABLET | Freq: Two times a day (BID) | ORAL | 1 refills | Status: DC

## 2020-08-14 NOTE — Telephone Encounter (Signed)
89 F  46.7 kg, SCr 0.48, LOV [place] 12/21

## 2020-08-14 NOTE — Progress Notes (Signed)
Subjective:    Patient ID: Heather [name], female    DOB: 12/25/1926, 87 y.o.   MRN: [id number]  HPI  ROV 02/22/20 --pleasant 87 year old woman whom I have followed for severe COPD (severe obstruction on PFT), hypoxemic respiratory failure ever since she had COVID-19 pneumonitis and 12/2018.  She also has pulmonary nodular disease by CT chest with a right perihilar spiculated nodule suspicious for malignancy that we have followed expectantly.  She was hospitalized in late October with an acute exacerbation of her COPD, seen here 02/13/2020.  She was treated with another prednisone taper at that visit, started on Breztri to see if she would get benefit.  Today she reports that her breathing did improve and she completed the pred. She believes that the [name] has been beneficial. Wondering about continuing it vs doing Symbicort + Spiriva (they have [place] assistance for Symbicort).  Most recent CT was 10/13/2019, reviewed by me, showed severe emphysema and slight increase in size in her medial right middle lobe pulmonary nodule, now 2.0 x 1.3 cm.  ROV 08/14/20 --87 year old woman with severe COPD, severe obstruction.  Associated hypoxemic respiratory failure.  She had COVID-19 pneumonitis in 12/2018.  We have been following a medial right middle lobe spiculated pulmonary nodule with serial CT scans, most consistent with a slow-growing pulmonary nodule.  She was hospitalized May 1 with acute febrile illness and imaging consistent with pneumonia as below.  Treated with antibiotics and improved, was having L chest pain. O2 at 2-5L/min.no cough. She is on [name] and doing well on it. Uses albuterol about 2-3x a day.   CT chest 07/29/2020 reviewed by me, shows emphysema, a focal lingular airspace consolidation that was most consistent with pneumonia, right hemidiaphragmatic elevation with some possible right lower lobe consolidation and airway impaction.  Her anterior right middle lobe nodule is  increased in size to 3.2 x 2.3 cm with some associated calcification  Notes from hospitalization reviewed.  Extended discussion with patient and family regarding relevance of her right middle lobe nodule, pros and cons of biopsy versus watchful waiting.   Review of Systems As above     Objective:   Physical Exam Vitals:   08/14/20 1028  BP: 92/60  Pulse: 84  Temp: 98.1 F (36.7 C)  TempSrc: Temporal  SpO2: 97%  Weight: 103 lb 12.8 oz (47.1 kg)  Height: 5\' 3"  (1.6 m)   Gen: Pleasant, thin elderly woman, in no distress,  normal affect  ENT: No lesions,  mouth clear,  oropharynx clear, no postnasal drip  Neck: No JVD, no stridor  Lungs: No use of accessory muscles, decreased BS, no crackles or wheezing on normal respiration, no wheeze on forced expiration  Cardiovascular: RRR, heart sounds normal, no murmur or gallops, 1+ ankle peripheral edema  Musculoskeletal: No deformities, no cyanosis or clubbing  Neuro: alert, awake, non focal  Skin: Warm, no lesions or rash     Assessment & Plan:  CAP (community acquired pneumonia) Recent pneumonia diagnosed principally by left-sided chest pain, lingular infiltrate on CT.  She did respond to antibiotics, is now pain-free.  Still weak from hospitalization.  Given the right-sided findings including her enlarging nodule, some pleural studding, there is at least some concern that the lingular findings could represent malignancy despite their appearance.  If she develops further symptoms then we will repeat a CT earlier.  Otherwise we will follow for lingular clearance on her next surveillance CT  Chronic respiratory failure with hypoxia ([name]) Continue current  oxygen 2-5 L/min  COPD (chronic obstructive pulmonary disease) (HCC) Please continue Breztri 2 puffs twice a day.  Rinse and gargle after using. Please use your albuterol either 1 nebulizer treatment or 2 puffs up to every 4 hours if needed for shortness of breath, chest  tightness, wheezing.  Pulmonary nodule Enlarging nodule in the right middle lobe remains consistent with primary lung cancer.  She is asymptomatic and we have been following it.  At some juncture it may become relevant to consider empiric XRT to manage symptoms.  I do not think she is a good candidate for bronchoscopy or biopsy.  She has some pleural findings on the right of unclear significance.  We have planned our next surveillance CT scan for 6 months.  [name], MD, PhD 08/14/2020, 1:29 PM [facility] [phone] or if no answer [phone]

## 2020-08-14 NOTE — Assessment & Plan Note (Signed)
Please continue Breztri 2 puffs twice a day.  Rinse and gargle after using. Please use your albuterol either 1 nebulizer treatment or 2 puffs up to every 4 hours if needed for shortness of breath, chest tightness, wheezing.

## 2020-08-14 NOTE — Assessment & Plan Note (Signed)
Recent pneumonia diagnosed principally by left-sided chest pain, lingular infiltrate on CT.  She did respond to antibiotics, is now pain-free.  Still weak from hospitalization.  Given the right-sided findings including her enlarging nodule, some pleural studding, there is at least some concern that the lingular findings could represent malignancy despite their appearance.  If she develops further symptoms then we will repeat a CT earlier.  Otherwise we will follow for lingular clearance on her next surveillance CT

## 2020-08-14 NOTE — Assessment & Plan Note (Signed)
Continue current oxygen 2-5 L/min

## 2020-08-14 NOTE — Assessment & Plan Note (Signed)
Enlarging nodule in the right middle lobe remains consistent with primary lung cancer.  She is asymptomatic and we have been following it.  At some juncture it may become relevant to consider empiric XRT to manage symptoms.  I do not think she is a good candidate for bronchoscopy or biopsy.  She has some pleural findings on the right of unclear significance.  We have planned our next surveillance CT scan for 6 months.

## 2020-08-14 NOTE — Patient Instructions (Signed)
Please continue Breztri 2 puffs twice a day.  Rinse and gargle after using. Please use your albuterol either 1 nebulizer treatment or 2 puffs up to every 4 hours if needed for shortness of breath, chest tightness, wheezing. Continue your oxygen at 2-5 L/min depending on your level of exertion We will plan to repeat your CT scan of the chest in November 2022. Please call our office and let us know if you have any changing symptoms with your breathing, pain in your chest, coughing, etc. Follow Dr. Lamonte Sakai in November after your CT scan or sooner if you develop any new problems.

## 2020-08-14 NOTE — Telephone Encounter (Signed)
lmtcb for pt on home and mobile number.

## 2020-08-15 NOTE — Telephone Encounter (Signed)
Heather [name] daughter is returning phone call. Heather [name] phone number is [phone].

## 2020-08-15 NOTE — Telephone Encounter (Signed)
Lm for patient's daughter, [name].

## 2020-08-16 ENCOUNTER — Telehealth: Payer: Self-pay | Admitting: Emergency Medicine

## 2020-08-16 MED ORDER — ALBUTEROL SULFATE HFA 108 (90 BASE) MCG/ACT IN AERS: 2.0000 | INHALATION_SPRAY | RESPIRATORY_TRACT | 5 refills | Status: DC | PRN

## 2020-08-16 NOTE — Telephone Encounter (Signed)
Spoke to patient's daughter, [name](DPR).  Heather [name] is requesting albuterol refill to be sent to [facility], as patient is currently out of this medication.  Rx has been sent to preferred pharmacy. Nothing further needed at this time.

## 2020-08-16 NOTE — Telephone Encounter (Signed)
Called and spoke with Melissa with [facility], advised that Albuterol was sent to the pharmacy on 08/16/20 and the prednisone was sent in on 08/09/20.  Nothing further needed.

## 2020-08-16 NOTE — Telephone Encounter (Signed)
[name] returning a phone call. [name] can be reached at [phone].

## 2020-08-17 DIAGNOSIS — I351 Nonrheumatic aortic (valve) insufficiency: Secondary | ICD-10-CM | POA: Diagnosis not present

## 2020-08-17 DIAGNOSIS — I5032 Chronic diastolic (congestive) heart failure: Secondary | ICD-10-CM | POA: Diagnosis not present

## 2020-08-17 DIAGNOSIS — I110 Hypertensive heart disease with heart failure: Secondary | ICD-10-CM | POA: Diagnosis not present

## 2020-08-17 DIAGNOSIS — J9621 Acute and chronic respiratory failure with hypoxia: Secondary | ICD-10-CM | POA: Diagnosis not present

## 2020-08-17 DIAGNOSIS — J189 Pneumonia, unspecified organism: Secondary | ICD-10-CM | POA: Diagnosis not present

## 2020-08-17 DIAGNOSIS — I480 Paroxysmal atrial fibrillation: Secondary | ICD-10-CM | POA: Diagnosis not present

## 2020-08-17 DIAGNOSIS — A419 Sepsis, unspecified organism: Secondary | ICD-10-CM | POA: Diagnosis not present

## 2020-08-17 DIAGNOSIS — J439 Emphysema, unspecified: Secondary | ICD-10-CM | POA: Diagnosis not present

## 2020-08-17 DIAGNOSIS — I714 Abdominal aortic aneurysm, without rupture: Secondary | ICD-10-CM | POA: Diagnosis not present

## 2020-08-21 ENCOUNTER — Telehealth: Payer: Self-pay | Admitting: Emergency Medicine

## 2020-08-21 DIAGNOSIS — J439 Emphysema, unspecified: Secondary | ICD-10-CM | POA: Diagnosis not present

## 2020-08-21 DIAGNOSIS — I5032 Chronic diastolic (congestive) heart failure: Secondary | ICD-10-CM | POA: Diagnosis not present

## 2020-08-21 DIAGNOSIS — J9621 Acute and chronic respiratory failure with hypoxia: Secondary | ICD-10-CM | POA: Diagnosis not present

## 2020-08-21 DIAGNOSIS — I110 Hypertensive heart disease with heart failure: Secondary | ICD-10-CM | POA: Diagnosis not present

## 2020-08-21 DIAGNOSIS — I480 Paroxysmal atrial fibrillation: Secondary | ICD-10-CM | POA: Diagnosis not present

## 2020-08-21 DIAGNOSIS — A419 Sepsis, unspecified organism: Secondary | ICD-10-CM | POA: Diagnosis not present

## 2020-08-21 DIAGNOSIS — I714 Abdominal aortic aneurysm, without rupture: Secondary | ICD-10-CM | POA: Diagnosis not present

## 2020-08-21 DIAGNOSIS — I351 Nonrheumatic aortic (valve) insufficiency: Secondary | ICD-10-CM | POA: Diagnosis not present

## 2020-08-21 DIAGNOSIS — J189 Pneumonia, unspecified organism: Secondary | ICD-10-CM | POA: Diagnosis not present

## 2020-08-21 MED ORDER — ALBUTEROL SULFATE HFA 108 (90 BASE) MCG/ACT IN AERS: 2.0000 | INHALATION_SPRAY | RESPIRATORY_TRACT | 5 refills | Status: AC | PRN

## 2020-08-21 MED ORDER — PREDNISONE 10 MG PO TABS: 10.0000 mg | ORAL_TABLET | Freq: Every day | ORAL | 0 refills | Status: DC

## 2020-08-21 NOTE — Telephone Encounter (Signed)
Called Melissa, [facility].  Ventolin and prednisone refills sent to [facility] as requested.  [facility] notified of pharmacy change.  Nothing further at this time.

## 2020-08-24 DIAGNOSIS — I351 Nonrheumatic aortic (valve) insufficiency: Secondary | ICD-10-CM | POA: Diagnosis not present

## 2020-08-24 DIAGNOSIS — J439 Emphysema, unspecified: Secondary | ICD-10-CM | POA: Diagnosis not present

## 2020-08-24 DIAGNOSIS — I480 Paroxysmal atrial fibrillation: Secondary | ICD-10-CM | POA: Diagnosis not present

## 2020-08-24 DIAGNOSIS — J189 Pneumonia, unspecified organism: Secondary | ICD-10-CM | POA: Diagnosis not present

## 2020-08-24 DIAGNOSIS — J9621 Acute and chronic respiratory failure with hypoxia: Secondary | ICD-10-CM | POA: Diagnosis not present

## 2020-08-24 DIAGNOSIS — I5032 Chronic diastolic (congestive) heart failure: Secondary | ICD-10-CM | POA: Diagnosis not present

## 2020-08-24 DIAGNOSIS — I110 Hypertensive heart disease with heart failure: Secondary | ICD-10-CM | POA: Diagnosis not present

## 2020-08-24 DIAGNOSIS — I714 Abdominal aortic aneurysm, without rupture: Secondary | ICD-10-CM | POA: Diagnosis not present

## 2020-08-24 DIAGNOSIS — A419 Sepsis, unspecified organism: Secondary | ICD-10-CM | POA: Diagnosis not present

## 2020-08-28 DIAGNOSIS — I5032 Chronic diastolic (congestive) heart failure: Secondary | ICD-10-CM | POA: Diagnosis not present

## 2020-08-28 DIAGNOSIS — M810 Age-related osteoporosis without current pathological fracture: Secondary | ICD-10-CM | POA: Diagnosis not present

## 2020-08-28 DIAGNOSIS — I110 Hypertensive heart disease with heart failure: Secondary | ICD-10-CM | POA: Diagnosis not present

## 2020-08-29 DIAGNOSIS — I5032 Chronic diastolic (congestive) heart failure: Secondary | ICD-10-CM | POA: Diagnosis not present

## 2020-08-29 DIAGNOSIS — A419 Sepsis, unspecified organism: Secondary | ICD-10-CM | POA: Diagnosis not present

## 2020-08-29 DIAGNOSIS — I714 Abdominal aortic aneurysm, without rupture: Secondary | ICD-10-CM | POA: Diagnosis not present

## 2020-08-29 DIAGNOSIS — I110 Hypertensive heart disease with heart failure: Secondary | ICD-10-CM | POA: Diagnosis not present

## 2020-08-29 DIAGNOSIS — J9621 Acute and chronic respiratory failure with hypoxia: Secondary | ICD-10-CM | POA: Diagnosis not present

## 2020-08-29 DIAGNOSIS — I351 Nonrheumatic aortic (valve) insufficiency: Secondary | ICD-10-CM | POA: Diagnosis not present

## 2020-08-29 DIAGNOSIS — J439 Emphysema, unspecified: Secondary | ICD-10-CM | POA: Diagnosis not present

## 2020-08-29 DIAGNOSIS — I480 Paroxysmal atrial fibrillation: Secondary | ICD-10-CM | POA: Diagnosis not present

## 2020-08-29 DIAGNOSIS — J189 Pneumonia, unspecified organism: Secondary | ICD-10-CM | POA: Diagnosis not present

## 2020-08-31 DIAGNOSIS — I480 Paroxysmal atrial fibrillation: Secondary | ICD-10-CM | POA: Diagnosis not present

## 2020-08-31 DIAGNOSIS — J439 Emphysema, unspecified: Secondary | ICD-10-CM | POA: Diagnosis not present

## 2020-08-31 DIAGNOSIS — I5032 Chronic diastolic (congestive) heart failure: Secondary | ICD-10-CM | POA: Diagnosis not present

## 2020-08-31 DIAGNOSIS — I714 Abdominal aortic aneurysm, without rupture: Secondary | ICD-10-CM | POA: Diagnosis not present

## 2020-08-31 DIAGNOSIS — I110 Hypertensive heart disease with heart failure: Secondary | ICD-10-CM | POA: Diagnosis not present

## 2020-08-31 DIAGNOSIS — J9621 Acute and chronic respiratory failure with hypoxia: Secondary | ICD-10-CM | POA: Diagnosis not present

## 2020-08-31 DIAGNOSIS — J189 Pneumonia, unspecified organism: Secondary | ICD-10-CM | POA: Diagnosis not present

## 2020-08-31 DIAGNOSIS — I351 Nonrheumatic aortic (valve) insufficiency: Secondary | ICD-10-CM | POA: Diagnosis not present

## 2020-08-31 DIAGNOSIS — A419 Sepsis, unspecified organism: Secondary | ICD-10-CM | POA: Diagnosis not present

## 2020-09-03 DIAGNOSIS — J189 Pneumonia, unspecified organism: Secondary | ICD-10-CM | POA: Diagnosis not present

## 2020-09-03 DIAGNOSIS — J439 Emphysema, unspecified: Secondary | ICD-10-CM | POA: Diagnosis not present

## 2020-09-03 DIAGNOSIS — I5032 Chronic diastolic (congestive) heart failure: Secondary | ICD-10-CM | POA: Diagnosis not present

## 2020-09-03 DIAGNOSIS — A419 Sepsis, unspecified organism: Secondary | ICD-10-CM | POA: Diagnosis not present

## 2020-09-03 DIAGNOSIS — I480 Paroxysmal atrial fibrillation: Secondary | ICD-10-CM | POA: Diagnosis not present

## 2020-09-03 DIAGNOSIS — I110 Hypertensive heart disease with heart failure: Secondary | ICD-10-CM | POA: Diagnosis not present

## 2020-09-03 DIAGNOSIS — I714 Abdominal aortic aneurysm, without rupture: Secondary | ICD-10-CM | POA: Diagnosis not present

## 2020-09-03 DIAGNOSIS — I351 Nonrheumatic aortic (valve) insufficiency: Secondary | ICD-10-CM | POA: Diagnosis not present

## 2020-09-03 DIAGNOSIS — J9621 Acute and chronic respiratory failure with hypoxia: Secondary | ICD-10-CM | POA: Diagnosis not present

## 2020-09-05 DIAGNOSIS — I110 Hypertensive heart disease with heart failure: Secondary | ICD-10-CM | POA: Diagnosis not present

## 2020-09-05 DIAGNOSIS — A419 Sepsis, unspecified organism: Secondary | ICD-10-CM | POA: Diagnosis not present

## 2020-09-05 DIAGNOSIS — I480 Paroxysmal atrial fibrillation: Secondary | ICD-10-CM | POA: Diagnosis not present

## 2020-09-05 DIAGNOSIS — J9621 Acute and chronic respiratory failure with hypoxia: Secondary | ICD-10-CM | POA: Diagnosis not present

## 2020-09-05 DIAGNOSIS — I351 Nonrheumatic aortic (valve) insufficiency: Secondary | ICD-10-CM | POA: Diagnosis not present

## 2020-09-05 DIAGNOSIS — I5032 Chronic diastolic (congestive) heart failure: Secondary | ICD-10-CM | POA: Diagnosis not present

## 2020-09-05 DIAGNOSIS — I714 Abdominal aortic aneurysm, without rupture: Secondary | ICD-10-CM | POA: Diagnosis not present

## 2020-09-05 DIAGNOSIS — J439 Emphysema, unspecified: Secondary | ICD-10-CM | POA: Diagnosis not present

## 2020-09-05 DIAGNOSIS — J189 Pneumonia, unspecified organism: Secondary | ICD-10-CM | POA: Diagnosis not present

## 2020-09-11 DIAGNOSIS — U071 COVID-19: Secondary | ICD-10-CM | POA: Diagnosis not present

## 2020-09-11 DIAGNOSIS — J9621 Acute and chronic respiratory failure with hypoxia: Secondary | ICD-10-CM | POA: Diagnosis not present

## 2020-09-11 DIAGNOSIS — I5032 Chronic diastolic (congestive) heart failure: Secondary | ICD-10-CM | POA: Diagnosis not present

## 2020-09-11 DIAGNOSIS — I351 Nonrheumatic aortic (valve) insufficiency: Secondary | ICD-10-CM | POA: Diagnosis not present

## 2020-09-11 DIAGNOSIS — I714 Abdominal aortic aneurysm, without rupture: Secondary | ICD-10-CM | POA: Diagnosis not present

## 2020-09-11 DIAGNOSIS — I110 Hypertensive heart disease with heart failure: Secondary | ICD-10-CM | POA: Diagnosis not present

## 2020-09-11 DIAGNOSIS — J439 Emphysema, unspecified: Secondary | ICD-10-CM | POA: Diagnosis not present

## 2020-09-11 DIAGNOSIS — J449 Chronic obstructive pulmonary disease, unspecified: Secondary | ICD-10-CM | POA: Diagnosis not present

## 2020-09-11 DIAGNOSIS — I480 Paroxysmal atrial fibrillation: Secondary | ICD-10-CM | POA: Diagnosis not present

## 2020-09-11 DIAGNOSIS — J189 Pneumonia, unspecified organism: Secondary | ICD-10-CM | POA: Diagnosis not present

## 2020-09-11 DIAGNOSIS — A419 Sepsis, unspecified organism: Secondary | ICD-10-CM | POA: Diagnosis not present

## 2020-09-13 DIAGNOSIS — I714 Abdominal aortic aneurysm, without rupture: Secondary | ICD-10-CM | POA: Diagnosis not present

## 2020-09-13 DIAGNOSIS — I110 Hypertensive heart disease with heart failure: Secondary | ICD-10-CM | POA: Diagnosis not present

## 2020-09-13 DIAGNOSIS — I480 Paroxysmal atrial fibrillation: Secondary | ICD-10-CM | POA: Diagnosis not present

## 2020-09-13 DIAGNOSIS — J189 Pneumonia, unspecified organism: Secondary | ICD-10-CM | POA: Diagnosis not present

## 2020-09-13 DIAGNOSIS — A419 Sepsis, unspecified organism: Secondary | ICD-10-CM | POA: Diagnosis not present

## 2020-09-13 DIAGNOSIS — I5032 Chronic diastolic (congestive) heart failure: Secondary | ICD-10-CM | POA: Diagnosis not present

## 2020-09-13 DIAGNOSIS — J439 Emphysema, unspecified: Secondary | ICD-10-CM | POA: Diagnosis not present

## 2020-09-13 DIAGNOSIS — I351 Nonrheumatic aortic (valve) insufficiency: Secondary | ICD-10-CM | POA: Diagnosis not present

## 2020-09-13 DIAGNOSIS — J9621 Acute and chronic respiratory failure with hypoxia: Secondary | ICD-10-CM | POA: Diagnosis not present

## 2020-09-14 DIAGNOSIS — I714 Abdominal aortic aneurysm, without rupture: Secondary | ICD-10-CM | POA: Diagnosis not present

## 2020-09-14 DIAGNOSIS — J189 Pneumonia, unspecified organism: Secondary | ICD-10-CM | POA: Diagnosis not present

## 2020-09-14 DIAGNOSIS — I351 Nonrheumatic aortic (valve) insufficiency: Secondary | ICD-10-CM | POA: Diagnosis not present

## 2020-09-14 DIAGNOSIS — I5032 Chronic diastolic (congestive) heart failure: Secondary | ICD-10-CM | POA: Diagnosis not present

## 2020-09-14 DIAGNOSIS — I480 Paroxysmal atrial fibrillation: Secondary | ICD-10-CM | POA: Diagnosis not present

## 2020-09-14 DIAGNOSIS — J439 Emphysema, unspecified: Secondary | ICD-10-CM | POA: Diagnosis not present

## 2020-09-14 DIAGNOSIS — J9621 Acute and chronic respiratory failure with hypoxia: Secondary | ICD-10-CM | POA: Diagnosis not present

## 2020-09-14 DIAGNOSIS — I110 Hypertensive heart disease with heart failure: Secondary | ICD-10-CM | POA: Diagnosis not present

## 2020-09-14 DIAGNOSIS — A419 Sepsis, unspecified organism: Secondary | ICD-10-CM | POA: Diagnosis not present

## 2020-09-17 DIAGNOSIS — I5032 Chronic diastolic (congestive) heart failure: Secondary | ICD-10-CM | POA: Diagnosis not present

## 2020-09-17 DIAGNOSIS — I480 Paroxysmal atrial fibrillation: Secondary | ICD-10-CM | POA: Diagnosis not present

## 2020-09-17 DIAGNOSIS — J439 Emphysema, unspecified: Secondary | ICD-10-CM | POA: Diagnosis not present

## 2020-09-17 DIAGNOSIS — I110 Hypertensive heart disease with heart failure: Secondary | ICD-10-CM | POA: Diagnosis not present

## 2020-09-17 DIAGNOSIS — A419 Sepsis, unspecified organism: Secondary | ICD-10-CM | POA: Diagnosis not present

## 2020-09-17 DIAGNOSIS — I351 Nonrheumatic aortic (valve) insufficiency: Secondary | ICD-10-CM | POA: Diagnosis not present

## 2020-09-17 DIAGNOSIS — J9621 Acute and chronic respiratory failure with hypoxia: Secondary | ICD-10-CM | POA: Diagnosis not present

## 2020-09-17 DIAGNOSIS — I714 Abdominal aortic aneurysm, without rupture: Secondary | ICD-10-CM | POA: Diagnosis not present

## 2020-09-17 DIAGNOSIS — J189 Pneumonia, unspecified organism: Secondary | ICD-10-CM | POA: Diagnosis not present

## 2020-09-18 DIAGNOSIS — I130 Hypertensive heart and chronic kidney disease with heart failure and stage 1 through stage 4 chronic kidney disease, or unspecified chronic kidney disease: Secondary | ICD-10-CM | POA: Diagnosis not present

## 2020-09-18 DIAGNOSIS — M810 Age-related osteoporosis without current pathological fracture: Secondary | ICD-10-CM | POA: Diagnosis not present

## 2020-09-18 DIAGNOSIS — E039 Hypothyroidism, unspecified: Secondary | ICD-10-CM | POA: Diagnosis not present

## 2020-09-19 ENCOUNTER — Other Ambulatory Visit: Payer: Self-pay

## 2020-09-19 ENCOUNTER — Ambulatory Visit: Payer: Medicare HMO | Admitting: Podiatry

## 2020-09-19 ENCOUNTER — Encounter: Payer: Self-pay | Admitting: Podiatry

## 2020-09-19 DIAGNOSIS — M79674 Pain in right toe(s): Secondary | ICD-10-CM

## 2020-09-19 DIAGNOSIS — B351 Tinea unguium: Secondary | ICD-10-CM

## 2020-09-19 DIAGNOSIS — M79675 Pain in left toe(s): Secondary | ICD-10-CM | POA: Diagnosis not present

## 2020-09-21 DIAGNOSIS — I714 Abdominal aortic aneurysm, without rupture: Secondary | ICD-10-CM | POA: Diagnosis not present

## 2020-09-21 DIAGNOSIS — A419 Sepsis, unspecified organism: Secondary | ICD-10-CM | POA: Diagnosis not present

## 2020-09-21 DIAGNOSIS — J189 Pneumonia, unspecified organism: Secondary | ICD-10-CM | POA: Diagnosis not present

## 2020-09-21 DIAGNOSIS — I351 Nonrheumatic aortic (valve) insufficiency: Secondary | ICD-10-CM | POA: Diagnosis not present

## 2020-09-21 DIAGNOSIS — I480 Paroxysmal atrial fibrillation: Secondary | ICD-10-CM | POA: Diagnosis not present

## 2020-09-21 DIAGNOSIS — I110 Hypertensive heart disease with heart failure: Secondary | ICD-10-CM | POA: Diagnosis not present

## 2020-09-21 DIAGNOSIS — I5032 Chronic diastolic (congestive) heart failure: Secondary | ICD-10-CM | POA: Diagnosis not present

## 2020-09-21 DIAGNOSIS — J9621 Acute and chronic respiratory failure with hypoxia: Secondary | ICD-10-CM | POA: Diagnosis not present

## 2020-09-21 DIAGNOSIS — J439 Emphysema, unspecified: Secondary | ICD-10-CM | POA: Diagnosis not present

## 2020-09-22 NOTE — Progress Notes (Signed)
Subjective: Heather [name] is a pleasant 87 y.o. female patient seen today painful thick toenails that are difficult to trim. Pain interferes with ambulation. Aggravating factors include wearing enclosed shoe gear. Pain is relieved with periodic professional debridement.  Her son, Heather [name], is present during today's visit.   Heather [name] voices no new pedal problems on today's visit. She uses supplemental O2 via nasal cannula.  PCP is Burnard Bunting, MD. Last visit was: 08/07/2020.  Allergies  Allergen Reactions   Sulfa Antibiotics     Other reaction(s): nausea & vomiting   Sulfamethoxazole-Trimethoprim     Other reaction(s): Unknown   Ace Inhibitors Cough   Elemental Sulfur Nausea And Vomiting    Objective: Physical Exam  General: Heather [name] is a pleasant 87 y.o. Caucasian female, frail, in NAD. AAO x 3.   Vascular:  Capillary fill time to digits <3 seconds b/l lower extremities. Faintly palpable pedal pulses b/l. Pedal hair absent. Lower extremity skin temperature gradient within normal limits. No pain with calf compression b/l. +1 pitting edema b/l lower extremities.  Dermatological:  Pedal skin is thin shiny, atrophic b/l lower extremities. No open wounds b/l lower extremities No interdigital macerations b/l lower extremities Toenails 2-5 bilaterally elongated, discolored, dystrophic, thickened, and crumbly with subungual debris and tenderness to dorsal palpation. Anonychia noted L hallux and R hallux. Nailbed(s) epithelialized.   Musculoskeletal:  Normal muscle strength 5/5 to all lower extremity muscle groups bilaterally. No pain crepitus or joint limitation noted with ROM b/l. No gross bony deformities bilaterally. Utilizes rollator for ambulation assistance.  Neurological:  Protective sensation intact 5/5 intact bilaterally with 10g monofilament b/l. Vibratory sensation intact b/l.  Assessment and Plan:  1. Pain due to onychomycosis of toenails of both feet      -Examined patient. -Patient to continue soft, supportive shoe gear daily. -Toenails 2-5 bilaterally debrided in length and girth without iatrogenic bleeding with sterile nail nipper and dremel.  -Patient to report any pedal injuries to medical professional immediately. -Patient/POA to call should there be question/concern in the interim.  Return in about 3 months (around 12/20/2020).  Marzetta Board, DPM

## 2020-09-26 DIAGNOSIS — Z9981 Dependence on supplemental oxygen: Secondary | ICD-10-CM | POA: Diagnosis not present

## 2020-09-26 DIAGNOSIS — R82998 Other abnormal findings in urine: Secondary | ICD-10-CM | POA: Diagnosis not present

## 2020-09-26 DIAGNOSIS — E46 Unspecified protein-calorie malnutrition: Secondary | ICD-10-CM | POA: Diagnosis not present

## 2020-09-26 DIAGNOSIS — R7301 Impaired fasting glucose: Secondary | ICD-10-CM | POA: Diagnosis not present

## 2020-09-26 DIAGNOSIS — I739 Peripheral vascular disease, unspecified: Secondary | ICD-10-CM | POA: Diagnosis not present

## 2020-09-26 DIAGNOSIS — J449 Chronic obstructive pulmonary disease, unspecified: Secondary | ICD-10-CM | POA: Diagnosis not present

## 2020-09-26 DIAGNOSIS — I700 Atherosclerosis of aorta: Secondary | ICD-10-CM | POA: Diagnosis not present

## 2020-09-26 DIAGNOSIS — I10 Essential (primary) hypertension: Secondary | ICD-10-CM | POA: Diagnosis not present

## 2020-09-26 DIAGNOSIS — Z0000 Encounter for general adult medical examination without abnormal findings: Secondary | ICD-10-CM | POA: Diagnosis not present

## 2020-09-26 DIAGNOSIS — E039 Hypothyroidism, unspecified: Secondary | ICD-10-CM | POA: Diagnosis not present

## 2020-09-26 DIAGNOSIS — I5032 Chronic diastolic (congestive) heart failure: Secondary | ICD-10-CM | POA: Diagnosis not present

## 2020-09-27 DIAGNOSIS — M810 Age-related osteoporosis without current pathological fracture: Secondary | ICD-10-CM | POA: Diagnosis not present

## 2020-09-27 DIAGNOSIS — M545 Low back pain, unspecified: Secondary | ICD-10-CM | POA: Diagnosis not present

## 2020-09-27 DIAGNOSIS — K5900 Constipation, unspecified: Secondary | ICD-10-CM | POA: Diagnosis not present

## 2020-09-27 DIAGNOSIS — I110 Hypertensive heart disease with heart failure: Secondary | ICD-10-CM | POA: Diagnosis not present

## 2020-09-27 DIAGNOSIS — I5032 Chronic diastolic (congestive) heart failure: Secondary | ICD-10-CM | POA: Diagnosis not present

## 2020-09-27 DIAGNOSIS — W1830XA Fall on same level, unspecified, initial encounter: Secondary | ICD-10-CM | POA: Diagnosis not present

## 2020-09-27 DIAGNOSIS — I480 Paroxysmal atrial fibrillation: Secondary | ICD-10-CM | POA: Diagnosis not present

## 2020-10-05 DIAGNOSIS — A419 Sepsis, unspecified organism: Secondary | ICD-10-CM | POA: Diagnosis not present

## 2020-10-05 DIAGNOSIS — J9621 Acute and chronic respiratory failure with hypoxia: Secondary | ICD-10-CM | POA: Diagnosis not present

## 2020-10-05 DIAGNOSIS — J439 Emphysema, unspecified: Secondary | ICD-10-CM | POA: Diagnosis not present

## 2020-10-05 DIAGNOSIS — I110 Hypertensive heart disease with heart failure: Secondary | ICD-10-CM | POA: Diagnosis not present

## 2020-10-05 DIAGNOSIS — I480 Paroxysmal atrial fibrillation: Secondary | ICD-10-CM | POA: Diagnosis not present

## 2020-10-05 DIAGNOSIS — I351 Nonrheumatic aortic (valve) insufficiency: Secondary | ICD-10-CM | POA: Diagnosis not present

## 2020-10-05 DIAGNOSIS — J189 Pneumonia, unspecified organism: Secondary | ICD-10-CM | POA: Diagnosis not present

## 2020-10-05 DIAGNOSIS — I714 Abdominal aortic aneurysm, without rupture: Secondary | ICD-10-CM | POA: Diagnosis not present

## 2020-10-05 DIAGNOSIS — I5032 Chronic diastolic (congestive) heart failure: Secondary | ICD-10-CM | POA: Diagnosis not present

## 2020-10-11 DIAGNOSIS — U071 COVID-19: Secondary | ICD-10-CM | POA: Diagnosis not present

## 2020-10-11 DIAGNOSIS — J449 Chronic obstructive pulmonary disease, unspecified: Secondary | ICD-10-CM | POA: Diagnosis not present

## 2020-10-12 DIAGNOSIS — J189 Pneumonia, unspecified organism: Secondary | ICD-10-CM | POA: Diagnosis not present

## 2020-10-12 DIAGNOSIS — I5032 Chronic diastolic (congestive) heart failure: Secondary | ICD-10-CM | POA: Diagnosis not present

## 2020-10-12 DIAGNOSIS — J9621 Acute and chronic respiratory failure with hypoxia: Secondary | ICD-10-CM | POA: Diagnosis not present

## 2020-10-12 DIAGNOSIS — I110 Hypertensive heart disease with heart failure: Secondary | ICD-10-CM | POA: Diagnosis not present

## 2020-10-12 DIAGNOSIS — J439 Emphysema, unspecified: Secondary | ICD-10-CM | POA: Diagnosis not present

## 2020-10-12 DIAGNOSIS — I714 Abdominal aortic aneurysm, without rupture: Secondary | ICD-10-CM | POA: Diagnosis not present

## 2020-10-12 DIAGNOSIS — I351 Nonrheumatic aortic (valve) insufficiency: Secondary | ICD-10-CM | POA: Diagnosis not present

## 2020-10-12 DIAGNOSIS — I480 Paroxysmal atrial fibrillation: Secondary | ICD-10-CM | POA: Diagnosis not present

## 2020-10-12 DIAGNOSIS — A419 Sepsis, unspecified organism: Secondary | ICD-10-CM | POA: Diagnosis not present

## 2020-10-16 DIAGNOSIS — J189 Pneumonia, unspecified organism: Secondary | ICD-10-CM | POA: Diagnosis not present

## 2020-10-16 DIAGNOSIS — I5033 Acute on chronic diastolic (congestive) heart failure: Secondary | ICD-10-CM | POA: Diagnosis not present

## 2020-10-16 DIAGNOSIS — J90 Pleural effusion, not elsewhere classified: Secondary | ICD-10-CM | POA: Diagnosis not present

## 2020-10-16 DIAGNOSIS — I5032 Chronic diastolic (congestive) heart failure: Secondary | ICD-10-CM | POA: Diagnosis not present

## 2020-10-16 DIAGNOSIS — Z9981 Dependence on supplemental oxygen: Secondary | ICD-10-CM | POA: Diagnosis not present

## 2020-10-16 DIAGNOSIS — R051 Acute cough: Secondary | ICD-10-CM | POA: Diagnosis not present

## 2020-10-16 DIAGNOSIS — I10 Essential (primary) hypertension: Secondary | ICD-10-CM | POA: Diagnosis not present

## 2020-10-16 DIAGNOSIS — Z1152 Encounter for screening for COVID-19: Secondary | ICD-10-CM | POA: Diagnosis not present

## 2020-10-16 DIAGNOSIS — J449 Chronic obstructive pulmonary disease, unspecified: Secondary | ICD-10-CM | POA: Diagnosis not present

## 2020-10-22 ENCOUNTER — Inpatient Hospital Stay (HOSPITAL_COMMUNITY)
Admission: EM | Admit: 2020-10-22 | Discharge: 2020-10-28 | DRG: 193 | Disposition: A | Discharge status: Home Health | Payer: Medicare HMO | Attending: Internal Medicine | Admitting: Internal Medicine

## 2020-10-22 ENCOUNTER — Emergency Department (HOSPITAL_COMMUNITY): Payer: Medicare HMO

## 2020-10-22 ENCOUNTER — Encounter (HOSPITAL_COMMUNITY): Payer: Self-pay

## 2020-10-22 ENCOUNTER — Other Ambulatory Visit: Payer: Self-pay

## 2020-10-22 DIAGNOSIS — Z888 Allergy status to other drugs, medicaments and biological substances status: Secondary | ICD-10-CM

## 2020-10-22 DIAGNOSIS — R0600 Dyspnea, unspecified: Secondary | ICD-10-CM | POA: Diagnosis not present

## 2020-10-22 DIAGNOSIS — M1990 Unspecified osteoarthritis, unspecified site: Secondary | ICD-10-CM | POA: Diagnosis present

## 2020-10-22 DIAGNOSIS — Z7901 Long term (current) use of anticoagulants: Secondary | ICD-10-CM | POA: Diagnosis not present

## 2020-10-22 DIAGNOSIS — I5033 Acute on chronic diastolic (congestive) heart failure: Secondary | ICD-10-CM | POA: Diagnosis not present

## 2020-10-22 DIAGNOSIS — I110 Hypertensive heart disease with heart failure: Secondary | ICD-10-CM | POA: Diagnosis present

## 2020-10-22 DIAGNOSIS — R000 Tachycardia, unspecified: Secondary | ICD-10-CM

## 2020-10-22 DIAGNOSIS — Z66 Do not resuscitate: Secondary | ICD-10-CM | POA: Diagnosis present

## 2020-10-22 DIAGNOSIS — I493 Ventricular premature depolarization: Secondary | ICD-10-CM | POA: Diagnosis present

## 2020-10-22 DIAGNOSIS — J9611 Chronic respiratory failure with hypoxia: Secondary | ICD-10-CM | POA: Diagnosis present

## 2020-10-22 DIAGNOSIS — Z823 Family history of stroke: Secondary | ICD-10-CM

## 2020-10-22 DIAGNOSIS — Z9981 Dependence on supplemental oxygen: Secondary | ICD-10-CM

## 2020-10-22 DIAGNOSIS — I509 Heart failure, unspecified: Secondary | ICD-10-CM | POA: Diagnosis not present

## 2020-10-22 DIAGNOSIS — R519 Headache, unspecified: Secondary | ICD-10-CM | POA: Diagnosis not present

## 2020-10-22 DIAGNOSIS — J439 Emphysema, unspecified: Secondary | ICD-10-CM | POA: Diagnosis present

## 2020-10-22 DIAGNOSIS — I480 Paroxysmal atrial fibrillation: Secondary | ICD-10-CM | POA: Diagnosis present

## 2020-10-22 DIAGNOSIS — Z79890 Hormone replacement therapy (postmenopausal): Secondary | ICD-10-CM

## 2020-10-22 DIAGNOSIS — R5383 Other fatigue: Secondary | ICD-10-CM

## 2020-10-22 DIAGNOSIS — R0602 Shortness of breath: Secondary | ICD-10-CM | POA: Diagnosis not present

## 2020-10-22 DIAGNOSIS — R739 Hyperglycemia, unspecified: Secondary | ICD-10-CM | POA: Diagnosis not present

## 2020-10-22 DIAGNOSIS — I5032 Chronic diastolic (congestive) heart failure: Secondary | ICD-10-CM | POA: Diagnosis not present

## 2020-10-22 DIAGNOSIS — R911 Solitary pulmonary nodule: Secondary | ICD-10-CM | POA: Diagnosis present

## 2020-10-22 DIAGNOSIS — R110 Nausea: Secondary | ICD-10-CM | POA: Diagnosis not present

## 2020-10-22 DIAGNOSIS — J918 Pleural effusion in other conditions classified elsewhere: Secondary | ICD-10-CM | POA: Diagnosis not present

## 2020-10-22 DIAGNOSIS — R42 Dizziness and giddiness: Secondary | ICD-10-CM | POA: Diagnosis present

## 2020-10-22 DIAGNOSIS — R0789 Other chest pain: Secondary | ICD-10-CM | POA: Diagnosis not present

## 2020-10-22 DIAGNOSIS — E876 Hypokalemia: Secondary | ICD-10-CM | POA: Diagnosis present

## 2020-10-22 DIAGNOSIS — J9621 Acute and chronic respiratory failure with hypoxia: Secondary | ICD-10-CM | POA: Diagnosis present

## 2020-10-22 DIAGNOSIS — I872 Venous insufficiency (chronic) (peripheral): Secondary | ICD-10-CM | POA: Diagnosis present

## 2020-10-22 DIAGNOSIS — J189 Pneumonia, unspecified organism: Principal | ICD-10-CM | POA: Diagnosis present

## 2020-10-22 DIAGNOSIS — J90 Pleural effusion, not elsewhere classified: Secondary | ICD-10-CM | POA: Diagnosis present

## 2020-10-22 DIAGNOSIS — M810 Age-related osteoporosis without current pathological fracture: Secondary | ICD-10-CM | POA: Diagnosis present

## 2020-10-22 DIAGNOSIS — Z882 Allergy status to sulfonamides status: Secondary | ICD-10-CM

## 2020-10-22 DIAGNOSIS — J441 Chronic obstructive pulmonary disease with (acute) exacerbation: Secondary | ICD-10-CM | POA: Diagnosis not present

## 2020-10-22 DIAGNOSIS — I351 Nonrheumatic aortic (valve) insufficiency: Secondary | ICD-10-CM | POA: Diagnosis present

## 2020-10-22 DIAGNOSIS — Z20822 Contact with and (suspected) exposure to covid-19: Secondary | ICD-10-CM | POA: Diagnosis not present

## 2020-10-22 DIAGNOSIS — I714 Abdominal aortic aneurysm, without rupture: Secondary | ICD-10-CM | POA: Diagnosis present

## 2020-10-22 DIAGNOSIS — Z8052 Family history of malignant neoplasm of bladder: Secondary | ICD-10-CM

## 2020-10-22 DIAGNOSIS — K219 Gastro-esophageal reflux disease without esophagitis: Secondary | ICD-10-CM | POA: Diagnosis present

## 2020-10-22 DIAGNOSIS — Z7951 Long term (current) use of inhaled steroids: Secondary | ICD-10-CM

## 2020-10-22 DIAGNOSIS — J449 Chronic obstructive pulmonary disease, unspecified: Secondary | ICD-10-CM | POA: Diagnosis not present

## 2020-10-22 DIAGNOSIS — R5381 Other malaise: Secondary | ICD-10-CM | POA: Diagnosis present

## 2020-10-22 DIAGNOSIS — Z79899 Other long term (current) drug therapy: Secondary | ICD-10-CM

## 2020-10-22 DIAGNOSIS — I10 Essential (primary) hypertension: Secondary | ICD-10-CM | POA: Diagnosis present

## 2020-10-22 DIAGNOSIS — Z833 Family history of diabetes mellitus: Secondary | ICD-10-CM

## 2020-10-22 DIAGNOSIS — I4891 Unspecified atrial fibrillation: Secondary | ICD-10-CM | POA: Diagnosis present

## 2020-10-22 DIAGNOSIS — E039 Hypothyroidism, unspecified: Secondary | ICD-10-CM | POA: Diagnosis present

## 2020-10-22 LAB — COMPREHENSIVE METABOLIC PANEL
ALT: 18 U/L (ref 0–44)
AST: 27 U/L (ref 15–41)
Albumin: 3.7 g/dL (ref 3.5–5.0)
Alkaline Phosphatase: 97 U/L (ref 38–126)
Anion gap: 10 (ref 5–15)
BUN: 11 mg/dL (ref 8–23)
CO2: 35 mmol/L — ABNORMAL HIGH (ref 22–32)
Calcium: 9.3 mg/dL (ref 8.9–10.3)
Chloride: 94 mmol/L — ABNORMAL LOW (ref 98–111)
Creatinine, Ser: 0.82 mg/dL (ref 0.44–1.00)
GFR, Estimated: >60 mL/min (ref >60)
Glucose, Bld: 123 mg/dL — ABNORMAL HIGH (ref 70–99)
Potassium: 2.8 mmol/L — ABNORMAL LOW (ref 3.5–5.1)
Sodium: 139 mmol/L (ref 135–145)
Total Bilirubin: 0.9 mg/dL (ref 0.3–1.2)
Total Protein: 5.9 g/dL — ABNORMAL LOW (ref 6.5–8.1)

## 2020-10-22 LAB — CBC WITH DIFFERENTIAL/PLATELET
Abs Immature Granulocytes: 0.07 10*3/uL (ref 0.00–0.07)
Basophils Absolute: 0.1 10*3/uL (ref 0.0–0.1)
Basophils Relative: 0 %
Eosinophils Absolute: 0 10*3/uL (ref 0.0–0.5)
Eosinophils Relative: 0 %
HCT: 45.2 % (ref 36.0–46.0)
Hemoglobin: 15.3 g/dL — ABNORMAL HIGH (ref 12.0–15.0)
Immature Granulocytes: 1 %
Lymphocytes Relative: 19 %
Lymphs Abs: 2.2 10*3/uL (ref 0.7–4.0)
MCH: 32.6 pg (ref 26.0–34.0)
MCHC: 33.8 g/dL (ref 30.0–36.0)
MCV: 96.2 fL (ref 80.0–100.0)
Monocytes Absolute: 0.7 10*3/uL (ref 0.1–1.0)
Monocytes Relative: 6 %
Neutro Abs: 8.1 10*3/uL — ABNORMAL HIGH (ref 1.7–7.7)
Neutrophils Relative %: 74 %
Platelets: 330 10*3/uL (ref 150–400)
RBC: 4.7 MIL/uL (ref 3.87–5.11)
RDW: 14.6 % (ref 11.5–15.5)
WBC: 11.1 10*3/uL — ABNORMAL HIGH (ref 4.0–10.5)
nRBC: 0 % (ref 0.0–0.2)

## 2020-10-22 LAB — BRAIN NATRIURETIC PEPTIDE: B Natriuretic Peptide: 62.2 pg/mL (ref 0.0–100.0)

## 2020-10-22 LAB — LACTIC ACID, PLASMA: Lactic Acid, Venous: 2 mmol/L (ref 0.5–1.9)

## 2020-10-22 LAB — RESP PANEL BY RT-PCR (FLU A&B, COVID) ARPGX2
Influenza A by PCR: NEGATIVE
Influenza B by PCR: NEGATIVE
SARS Coronavirus 2 by RT PCR: NEGATIVE

## 2020-10-22 LAB — MAGNESIUM: Magnesium: 2.2 mg/dL (ref 1.7–2.4)

## 2020-10-22 MED ORDER — POLYETHYLENE GLYCOL 3350 17 G PO PACK
17.0000 g | PACK | Freq: Every day | ORAL | Status: DC
  Administered 2020-10-24 – 2020-10-28 (×6): 17 g via ORAL
  Filled 2020-10-22 (×6): qty 1

## 2020-10-22 MED ORDER — MAGNESIUM OXIDE -MG SUPPLEMENT 400 (240 MG) MG PO TABS
400.0000 mg | ORAL_TABLET | Freq: Every day | ORAL | Status: DC
  Administered 2020-10-24 – 2020-10-28 (×5): 400 mg via ORAL
  Filled 2020-10-22 (×5): qty 1

## 2020-10-22 MED ORDER — POTASSIUM CHLORIDE 10 MEQ/100ML IV SOLN
10.0000 meq | INTRAVENOUS | Status: AC
  Administered 2020-10-22: 10 meq via INTRAVENOUS
  Filled 2020-10-22 (×2): qty 100

## 2020-10-22 MED ORDER — MAGNESIUM OXIDE 400 MG PO TABS
400.0000 mg | ORAL_TABLET | Freq: Every day | ORAL | Status: DC
  Filled 2020-10-22: qty 1

## 2020-10-22 MED ORDER — MELATONIN 5 MG PO TABS
5.0000 mg | ORAL_TABLET | Freq: Every evening | ORAL | Status: DC | PRN
  Administered 2020-10-22: 5 mg via ORAL
  Filled 2020-10-22: qty 1

## 2020-10-22 MED ORDER — LEVOTHYROXINE SODIUM 25 MCG PO TABS
137.0000 ug | ORAL_TABLET | Freq: Every day | ORAL | Status: DC
  Administered 2020-10-24 – 2020-10-28 (×5): 137 ug via ORAL
  Filled 2020-10-22 (×8): qty 1

## 2020-10-22 MED ORDER — APIXABAN 2.5 MG PO TABS
2.5000 mg | ORAL_TABLET | Freq: Two times a day (BID) | ORAL | Status: DC
  Administered 2020-10-22 – 2020-10-28 (×11): 2.5 mg via ORAL
  Filled 2020-10-22 (×13): qty 1

## 2020-10-22 MED ORDER — FUROSEMIDE 20 MG PO TABS: 40.0000 mg | ORAL_TABLET | Freq: Every day | ORAL | Status: DC

## 2020-10-22 MED ORDER — ALBUTEROL SULFATE (2.5 MG/3ML) 0.083% IN NEBU
2.5000 mg | INHALATION_SOLUTION | Freq: Two times a day (BID) | RESPIRATORY_TRACT | Status: DC | PRN
  Administered 2020-10-27: 2.5 mg via RESPIRATORY_TRACT
  Filled 2020-10-22: qty 3

## 2020-10-22 MED ORDER — GUAIFENESIN-DM 100-10 MG/5ML PO SYRP
5.0000 mL | ORAL_SOLUTION | Freq: Four times a day (QID) | ORAL | Status: DC | PRN
  Filled 2020-10-22: qty 5

## 2020-10-22 MED ORDER — SODIUM CHLORIDE 0.9 % IV SOLN
500.0000 mg | Freq: Once | INTRAVENOUS | Status: AC
  Administered 2020-10-22: 500 mg via INTRAVENOUS
  Filled 2020-10-22: qty 500

## 2020-10-22 MED ORDER — SODIUM CHLORIDE 0.9 % IV SOLN
2.0000 g | INTRAVENOUS | Status: AC
  Administered 2020-10-22 – 2020-10-25 (×4): 2 g via INTRAVENOUS
  Filled 2020-10-22: qty 20
  Filled 2020-10-22: qty 2
  Filled 2020-10-22: qty 20
  Filled 2020-10-22: qty 2

## 2020-10-22 MED ORDER — AZITHROMYCIN 250 MG PO TABS
500.0000 mg | ORAL_TABLET | Freq: Every day | ORAL | Status: DC
  Administered 2020-10-24 – 2020-10-26 (×3): 500 mg via ORAL
  Filled 2020-10-22 (×3): qty 2

## 2020-10-22 MED ORDER — MOMETASONE FURO-FORMOTEROL FUM 200-5 MCG/ACT IN AERO
2.0000 | INHALATION_SPRAY | Freq: Two times a day (BID) | RESPIRATORY_TRACT | Status: DC
Start: 2020-10-22 — End: 2020-10-28
  Administered 2020-10-23 – 2020-10-28 (×11): 2 via RESPIRATORY_TRACT
  Filled 2020-10-22: qty 8.8

## 2020-10-22 MED ORDER — MELATONIN 10 MG PO CAPS: 10.0000 mg | ORAL_CAPSULE | Freq: Every evening | ORAL | Status: DC | PRN

## 2020-10-22 MED ORDER — POTASSIUM CHLORIDE CRYS ER 20 MEQ PO TBCR
40.0000 meq | EXTENDED_RELEASE_TABLET | ORAL | Status: AC
  Filled 2020-10-22: qty 2

## 2020-10-22 MED ORDER — DILTIAZEM HCL ER COATED BEADS 180 MG PO CP24
180.0000 mg | ORAL_CAPSULE | Freq: Every day | ORAL | Status: DC
  Administered 2020-10-22 – 2020-10-28 (×6): 180 mg via ORAL
  Filled 2020-10-22 (×7): qty 1

## 2020-10-22 MED ORDER — TIOTROPIUM BROMIDE MONOHYDRATE 2.5 MCG/ACT IN AERS: 2.0000 | INHALATION_SPRAY | Freq: Every day | RESPIRATORY_TRACT | Status: DC

## 2020-10-22 MED ORDER — ALBUTEROL SULFATE HFA 108 (90 BASE) MCG/ACT IN AERS
2.0000 | INHALATION_SPRAY | RESPIRATORY_TRACT | Status: DC | PRN
  Administered 2020-10-23: 2 via RESPIRATORY_TRACT
  Filled 2020-10-22: qty 6.7

## 2020-10-22 MED ORDER — SODIUM CHLORIDE 0.9 % IV SOLN
1.0000 g | Freq: Once | INTRAVENOUS | Status: DC
  Filled 2020-10-22: qty 10

## 2020-10-22 MED ORDER — UMECLIDINIUM BROMIDE 62.5 MCG/INH IN AEPB
1.0000 | INHALATION_SPRAY | Freq: Every day | RESPIRATORY_TRACT | Status: DC
  Administered 2020-10-23 – 2020-10-28 (×6): 1 via RESPIRATORY_TRACT
  Filled 2020-10-22 (×2): qty 7

## 2020-10-22 NOTE — H&P (Signed)
History and Physical    Heather [name] QJJ:[id number] DOB: 21-Aug-1926 DOA: 10/22/2020  PCP: Burnard Bunting, MD   Patient coming from: Home  Chief Complaint: Weakness  HPI: Heather [name] is a 87 y.o. female with medical history significant of COPD, paroxysmal A. Fib, chronic hypoxic respiratory failure on 2 L of oxygen per minute at home,diastolic congestive heart failure, pulmonary nodule, abdominal aortic aneurysm, hypothyroidism, hypertension who presented to the emergency department  with complaints of increased weakness, fatigue, shortness of breath.  Patient was seen by her PCP about a week ago and was found to have right-sided pleural effusion and was treated for pneumonia.  She has finished antibiotic treatment and was feeling better until yesterday.  She again started having increased weakness along with shortness of breath.  She could not get out of the bed or walk.  She was also breathing fast.  Daughter then brought her today to the emergency department. On her usual days, patient can walk with help of walker.  She uses 2 L of oxygen as needed at home.  Now she is requiring oxygen 24 hours. There is no report of fever, chills, cough, headache, chest pain, abdomen pain, dysuria, nausea, vomiting or diarrhea. Daughter was present at the bedside and the history was taken with the help of her. During my evaluation, she was on 2 L of oxygen per minute, maintaining her saturation at 100%.  She appeared tachypneic.  ED Course: She was tachycardic on presentation.  EKG showed PVCs.  She was tachypneic.  Blood pressure was stable.  Lab work showed potassium of 2.8.  Mild elevated leukocytes.  BNP was normal.  Chest x-ray showed mild to moderate right-sided pleural effusion with emphysema.  Patient was admitted for the management of right-sided pneumonia along with pleural effusion not responding to outpatient antibiotics.  US thoracentesis ordered.  Review of Systems: As per HPI otherwise  10 point review of systems negative.    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    Mild aortic insufficiency    Osteoporosis    Peripheral neuropathy    Pneumonia    states she had it twice, last time a couple of months ago   PONV (postoperative nausea and vomiting)    Shortness of breath    with exertion   Unspecified hypothyroidism    Venous insufficiency     Past Surgical History:  Procedure Laterality Date   APPENDECTOMY  1935   BACK SURGERY     x2   Cardiometablic Testing  4/81/8563   submaximal effort with peak RER of 0.5, peak VO2 79% predicted; HR peak up to 78%; PVC was 55% predicted, PEV1 39% predicted; PEV1/VC ratio was reduced; normal vital capacity; DLCO was reduced to 63%   CARPAL TUNNEL RELEASE     CATARACT EXTRACTION W/ INTRAOCULAR LENS  IMPLANT, BILATERAL     CHOLECYSTECTOMY  04/07/2014   dr Marlou Starks   CHOLECYSTECTOMY N/A 04/07/2014   Procedure: LAPAROSCOPIC CHOLECYSTECTOMY WITH INTRAOPERATIVE CHOLANGIOGRAM POSSBILE OPEN;  Surgeon: Autumn Messing III, MD;  Location: [place];  Service: General;  Laterality: N/A;   COLON SURGERY  2008   partial   ESOPHAGOGASTRODUODENOSCOPY (EGD) WITH PROPOFOL N/A 05/25/2017   Procedure: ESOPHAGOGASTRODUODENOSCOPY (EGD) WITH PROPOFOL;  Surgeon: Clarene Essex, MD;  Location: [place];  Service: Endoscopy;  Laterality: N/A;   HERNIA REPAIR     JOINT REPLACEMENT     left hip relacement  2000   SAVORY DILATION N/A 05/25/2017   Procedure: SAVORY DILATION;  Surgeon: Clarene Essex, MD;  Location: [place];   Service: Endoscopy;  Laterality: N/A;   TONSILLECTOMY  1935   TOTAL ABDOMINAL HYSTERECTOMY  1970   TRANSTHORACIC ECHOCARDIOGRAM  05/2011   EF=>55%; mild conc LVH; mild mitral annular calcif; mild TR; AV mildly sclerotic & mild AR   VENTRAL HERNIA REPAIR  09/19/2011   Procedure: LAPAROSCOPIC VENTRAL HERNIA;  Surgeon: Merrie Roof, MD;  Location: [place];  Service: General;  Laterality: N/A;  laparoscopic ventral hernia repair with mesh     reports that she quit smoking about 42 years ago. Her smoking use included cigarettes. She started smoking about 62 years ago. She has a 20.00 pack-year smoking history. She has never used smokeless tobacco. She reports previous alcohol use. She reports that she does not use drugs.  Allergies  Allergen Reactions   Sulfa Antibiotics     Other reaction(s): nausea & vomiting   Sulfamethoxazole-Trimethoprim     Other reaction(s): Unknown   Ace Inhibitors Cough   Elemental Sulfur Nausea And Vomiting    Family History  Problem Relation Age of Onset   Stroke Mother    Stroke Father    Heart block Brother    Bladder Cancer Brother    Diabetes Brother    Stroke Brother      Prior to Admission medications   Medication Sig Start Date End Date Taking? Authorizing Provider  albuterol (PROVENTIL) (2.5 MG/3ML) 0.083% nebulizer solution Take 2.5 mg by nebulization 2 (two) times daily as needed for wheezing or shortness of breath.    [provider]  albuterol (VENTOLIN HFA) 108 (90 Base) MCG/ACT inhaler Inhale 2 puffs into the lungs every 4 (four) hours as needed for wheezing or shortness of breath. 08/21/20   Byrum, Rose Fillers, MD  apixaban (ELIQUIS) 2.5 MG TABS tablet Take 1 tablet (2.5 mg total) by mouth 2 (two) times daily. 08/14/20   Hilty, Nadean Corwin, MD  Budeson-Glycopyrrol-Formoterol (BREZTRI AEROSPHERE) 160-9-4.8 MCG/ACT AERO 2 puffs    [provider]  budesonide-formoterol (SYMBICORT) 160-4.5 MCG/ACT inhaler Inhale 1 puff into the lungs  2 (two) times daily. Patient not taking: Reported on 08/14/2020 02/02/20   Collene Gobble, MD  calcium citrate-vitamin D (CITRACAL+D) 315-200 MG-UNIT per tablet Take 1 tablet by mouth daily.    [provider]  Carboxymethylcellulose Sodium (THERATEARS OP) Place 2 drops into both eyes daily.     [provider]  cholecalciferol (VITAMIN D3) 25 MCG (1000 UNIT) tablet Take 1,000 Units by mouth daily.    [provider]  diltiazem (CARDIZEM CD) 180 MG 24 hr capsule Take 180 mg by mouth daily.     [provider]  feeding supplement, ENSURE ENLIVE, (ENSURE ENLIVE) LIQD Take 237 mLs by mouth 2 (two) times daily between meals. 10/21/19   Manuella Ghazi, Pratik D, DO  fluticasone (FLONASE) 50 MCG/ACT nasal spray USE 2 SPRAY(S) IN EACH NOSTRIL ONCE DAILY IN THE EVENING Patient taking differently: Place 2 sprays into both nostrils every evening.  07/24/20   Rozetta Nunnery, MD  furosemide (LASIX) 40 MG tablet TAKE 1 TABLET BY MOUTH ONCE DAILY WITH POTASSIUM    [provider]  guaiFENesin-dextromethorphan (ROBITUSSIN DM) 100-10 MG/5ML syrup Take 5 mLs by mouth every 6 (six) hours as needed for cough. Patient not taking: Reported on 08/14/2020 01/11/19   Arrien, Jimmy Picket, MD  Lactobacillus Reuteri (BIOGAIA PROBIOTIC PO) Take 1 capsule by mouth daily.    [provider]  levothyroxine (SYNTHROID, LEVOTHROID) 137 MCG tablet Take 137 mcg by mouth daily before breakfast.     [provider]  magnesium oxide (MAG-OX) 400 MG tablet Take 400 mg by mouth daily.    [provider]  Melatonin 10 MG CAPS Take 10 mg by mouth at bedtime as needed (sleep).    [provider]  Multiple Vitamins-Minerals (MULTIVITAMIN WITH MINERALS) tablet Take 1 tablet by mouth daily.    [provider]  polyethylene glycol (MIRALAX / GLYCOLAX) packet Take 17 g by mouth daily.    [provider]  potassium chloride SA (KLOR-CON) 20 MEQ tablet  TAKE 1 TABLET BY MOUTH ONCE DAILY WITH FUROSEMIDE    [provider]  predniSONE (DELTASONE) 10 MG tablet 1 tablet 12/28/19   [provider]  Tiotropium Bromide Monohydrate (SPIRIVA RESPIMAT) 2.5 MCG/ACT AERS Inhale 2 puffs into the lungs daily. Patient not taking: Reported on 08/14/2020 04/12/20   [name], NP    Physical Exam: Vitals:   10/22/20 1500 10/22/20 1545 10/22/20 1600 10/22/20 1630  BP: (!) 141/80 139/71 119/69 108/72  Pulse: (!) 112 88 (!) 164 (!) 125  Resp: (!) 23 (!) 28 (!) 31 (!) 25  Temp:      TempSrc:      SpO2: 100% (!) 87% 100% 100%    Constitutional: Weak, frail, chronically ill looking female Vitals:   10/22/20 1500 10/22/20 1545 10/22/20 1600 10/22/20 1630  BP: (!) 141/80 139/71 119/69 108/72  Pulse: (!) 112 88 (!) 164 (!) 125  Resp: (!) 23 (!) 28 (!) 31 (!) 25  Temp:      TempSrc:      SpO2: 100% (!) 87% 100% 100%   Eyes: PERRL, lids and conjunctivae normal ENMT: Mucous membranes are moist.  Neck: normal, supple, no masses, no thyromegaly Respiratory: no wheezes or crackles, diminished air entry bilaterally. No accessory muscle use.  Cardiovascular: Irregular rhythm, no murmurs / rubs / gallops. No extremity edema.  Abdomen: no tenderness, no masses palpated. No hepatosplenomegaly. Bowel sounds positive.  Musculoskeletal: no clubbing / cyanosis. No joint deformity upper and lower extremities.  Skin: no rashes, lesions, ulcers. No induration Neurologic: CN 2-12 grossly intact.  Strength 5/5 in all 4.  Psychiatric: Normal judgment and insight. Alert and oriented x 3. Normal mood.   Foley Catheter:None  Labs on Admission: I have personally reviewed following labs and imaging studies  CBC: Recent Labs  Lab 10/22/20 1150  WBC 11.1*  NEUTROABS 8.1*  HGB 15.3*  HCT 45.2  MCV 96.2  PLT 834   Basic Metabolic Panel: Recent Labs  Lab 10/22/20 1150  NA 139  K 2.8*  CL 94*  CO2 35*  GLUCOSE 123*  BUN 11  CREATININE 0.82   CALCIUM 9.3   GFR: CrCl cannot be calculated (Unknown ideal weight.). Liver Function Tests: Recent Labs  Lab 10/22/20 1150  AST 27  ALT 18  ALKPHOS 97  BILITOT 0.9  PROT 5.9*  ALBUMIN 3.7   No results for input(s): LIPASE,  AMYLASE in the last 168 hours. No results for input(s): AMMONIA in the last 168 hours. Coagulation Profile: No results for input(s): INR, PROTIME in the last 168 hours. Cardiac Enzymes: No results for input(s): CKTOTAL, CKMB, CKMBINDEX, TROPONINI in the last 168 hours. BNP (last 3 results) No results for input(s): PROBNP in the last 8760 hours. HbA1C: No results for input(s): HGBA1C in the last 72 hours. CBG: No results for input(s): GLUCAP in the last 168 hours. Lipid Profile: No results for input(s): CHOL, HDL, LDLCALC, TRIG, CHOLHDL, LDLDIRECT in the last 72 hours. Thyroid Function Tests: No results for input(s): TSH, T4TOTAL, FREET4, T3FREE, THYROIDAB in the last 72 hours. Anemia Panel: No results for input(s): VITAMINB12, FOLATE, FERRITIN, TIBC, IRON, RETICCTPCT in the last 72 hours. Urine analysis:    Component Value Date/Time   COLORURINE YELLOW 07/31/2020 1320   APPEARANCEUR CLEAR 07/31/2020 1320   LABSPEC 1.010 07/31/2020 1320   PHURINE 6.0 07/31/2020 1320   GLUCOSEU NEGATIVE 07/31/2020 1320   HGBUR NEGATIVE 07/31/2020 1320   BILIRUBINUR NEGATIVE 07/31/2020 1320   KETONESUR NEGATIVE 07/31/2020 1320   PROTEINUR NEGATIVE 07/31/2020 1320   UROBILINOGEN 0.2 05/17/2013 1207   NITRITE NEGATIVE 07/31/2020 1320   LEUKOCYTESUR NEGATIVE 07/31/2020 1320    Radiological Exams on Admission: DG Chest 2 View  Result Date: 10/22/2020 CLINICAL DATA:  Shortness of breath and chest tightness. Status post treatment for pneumonia. EXAM: CHEST - 2 VIEW COMPARISON:  02/27/2020 FINDINGS: Stable cardiomediastinal contours. Aortic atherosclerotic calcifications. There is a small to moderate right pleural effusion. Lungs are hyperinflated and there are  coarsened interstitial markings compatible with emphysema. Bones appear diffusely osteopenic. Multi level compression fractures are identified throughout the thoracic spine, similar to previous exam. IMPRESSION: 1. Small to moderate right pleural effusion. 2. Emphysema. Electronically Signed   By: Kerby Moors M.D.   On: 10/22/2020 12:30     Assessment/Plan Principal Problem:   PNA (pneumonia) Active Problems:   PAF (paroxysmal atrial fibrillation) ([place])   Long term current use of anticoagulant therapy   HTN (hypertension)   COPD (chronic obstructive pulmonary disease) (HCC)   Hypokalemia   Hypothyroidism   Chronic respiratory failure with hypoxia (HCC)   Pleural effusion on right   Pleural effusion, right   Right-sided pneumonia/pleural effusion: Patient was recently treated with oral antibiotics as an outpatient after finding of pleural effusion on the right side.  Chest x-ray done on presentation here did not show clear consolidation but again showed right-sided pleural effusion. Will get diagnostic/therapeutic ultrasound-guided thoracentesis.  Continue ceftriaxone /azithromycin for now.  We will follow-up pleural fluid analysis.  Acute on chronic hypoxic respiratory failure: Uses 2 L of oxygen at home as needed.  Currently needing continuous 2 L of oxygen.  Patient is tachypneic most likely from pneumonia, pleural effusion.  Continue to monitor  History of COPD: Currently not in exacerbation.  Continue bronchodilators as needed.  Continue home inhalers.  No indication for steroids for now.  Paroxysmal A. fib: EKG showed irregular rhythm with PVCs.  Tachycardic in the emergency department.  Continue home Eliquis for anticoagulation, Cardizem for rate control  Severe hypokalemia: We will aggressively supplement.  Check magnesium level.  Hypothyroidism: Continue Synthyroid  History of pulmonary nodule: Found to have noted in the right middle lobe, suspicious for malignancy.   Currently she is asymptomatic.  She is following closely with pulmonology.  She was considered not a good candidate for bronchoscopy or biopsy.  History of hypertension: Currently blood stable.  Continue current  medications  History of diastolic congestive heart failure: BNP is normal.  Appears euvolemic at present .we will hold Lasix for now.,  Takes 40 mg at home.  History of abdominal aortic aneurysm: CT on 1/21 showed 3.9 cm ascending aortic organism.  Currently on surveillance.  Debility/deconditioning: We will request for PT/OT evaluation.  Presented with generalized weakness, inability to ambulate.      Severity of Illness: The appropriate patient status for this patient is INPATIENT.   DVT prophylaxis: Eliquis Code Status: DNR Family Communication: daughter at bedside Consults called: None     Shelly Coss MD [facility]  10/22/2020, 5:22 PM

## 2020-10-22 NOTE — ED Provider Notes (Signed)
  Face-to-face evaluation   History: She presents for evaluation of feeling weak, short of breath, and not improving after taking antibiotics recently for "early pneumonia. " Daughter describes patient's chest x-ray showing an effusion, for which she was started on antibiotics.  She completed them, 2 days ago.  No chest pain, fever, chills, nausea or vomiting.  She has history of pulmonary nodules.  No prior cancer diagnosis.  Physical exam: Alert elderly female who is uncomfortable.  She is tachypneic.  Lungs with decreased Ehrmann anteriorly.  No respiratory distress.  Abdomen soft nontender.  Medical screening examination/treatment/procedure(s) were conducted as a shared visit with non-physician practitioner(s) and myself.  I personally evaluated the patient during the encounter    Daleen Bo, MD 10/23/20 [phone]

## 2020-10-22 NOTE — ED Triage Notes (Signed)
Patient complains of increased SOB and chest tightness for the pat 2 days. Just finished antibiotic for pneumonia. Patient on oxygen at 3l on arrival. Speaking complete sentences

## 2020-10-22 NOTE — ED Provider Notes (Signed)
Emergency Medicine Provider Triage Evaluation Note  Heather [name] , a 87 y.o. female  was evaluated in triage.  Pt complains of shortness of breath and weakness.  Patient was recently diagnosed with pneumonia, completed course of antibiotics and felt better until yesterday when symptoms worsen.  Since then, she has had worsening shortness of breath and generalized weakness.  Difficulty walking today, which is not her baseline.  She reports associated nausea, no vomiting.  Review of Systems  Positive: Sob, weakness Negative: vomiting  Physical Exam  BP 120/63 (BP Location: Right Arm)   Pulse (!) 44   Temp 98.5 F (36.9 C) (Oral)   Resp (!) 38   SpO2 95%  Gen:   Awake,  ill Resp:  Speaking in short, 3-4 word sentences. MSK:   Chronic skin changes of bilat lower legs   Medical Decision Making  Medically screening exam initiated at 11:50 AM.  Appropriate orders placed.  Heather [name] was informed that the remainder of the evaluation will be completed by another provider, this initial triage assessment does not replace that evaluation, and the importance of remaining in the ED until their evaluation is complete.  Labs, covid, ekg, cxr   Franchot Heidelberg, PA-C 10/22/20 1151    Daleen Bo, MD 10/23/20 [phone]

## 2020-10-22 NOTE — ED Provider Notes (Signed)
[facility]ER[facility]Y DEPARTMENT Provider Note   CSN: [id number] Arrival date & time: 10/22/20  1042     History No chief complaint on file.   Heather [name] is a 87 y.o. female presenting for evaluation of increased weakness, fatigue, shortness of breath.  Patient had a fall 3 to 4 weeks ago.  About a week ago she went to the doctor due to fatigue, was diagnosed with pneumonia and treated with antibiotics, Mucinex, and a half dose of Lasix.  She felt better until yesterday, when she had significant increased weakness and shortness of breath.  Due to weakness and fatigue, she could not get out of bed or walk, this is not her baseline.  She has associated chest tightness with A. fib that comes and goes.  She has a history of COPD, wears 3 L of oxygen as needed, usually does not need it at night.  Recently, patient has needed all the time.  She has associated headache, dizziness, feeling off balance and presyncopal.  No nausea, vomiting, diarrhea.  Additional history obtained from chart review.  Patient with a history of A. 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    Mild aortic insufficiency    Osteoporosis    Peripheral neuropathy    Pneumonia    states she had it twice, last time a couple of months ago   PONV (postoperative nausea and vomiting)    Shortness of breath    with exertion   Unspecified hypothyroidism    Venous insufficiency      Patient Active Problem List   Diagnosis Date Noted   PNA (pneumonia) 10/22/2020   Pleural effusion on right 10/22/2020   Pleural effusion, right 10/22/2020   Elevated troponin 07/29/2020   History of rib fracture 07/29/2020   Pressure injury of skin 02/28/2020   Chronic respiratory failure with hypoxia ([place]) 02/22/2020   Acute on chronic respiratory failure with hypoxia ([place]) 10/18/2019   Pulmonary nodule 03/29/2019   Hypotension 01/05/2019   Protein-calorie malnutrition, severe 04/26/2018   Skin avulsion 09/19/2016   Influenza B 04/16/2016   Sepsis ([place]) 04/16/2016   Hypokalemia 04/16/2016   Hypothyroidism 04/16/2016   Post herpetic neuralgia 06/06/2014   Shingles outbreak 04/19/2014   Gallstones 09/20/2013   CAP (community acquired pneumonia) 05/17/2013   COPD (chronic obstructive pulmonary disease) ([place]) 05/17/2013   Dyspnea on exertion 04/25/2013   Pulmonary emphysema ([place]) 11/15/2012   HTN (hypertension) 11/15/2012   PAF (paroxysmal atrial fibrillation) ([place]) 06/14/2012   Long term current use of anticoagulant therapy 06/14/2012   Ventral hernia 07/31/2011   Abdominal wall mass 07/01/2011    Past Surgical History:  Procedure Laterality Date   APPENDECTOMY  1935   BACK SURGERY     x2   Cardiometablic Testing  0/12/2723   submaximal effort with peak RER of 0.5, peak VO2 79% predicted; HR peak up to 78%; PVC was 55% predicted, PEV1  39% predicted; PEV1/VC ratio was reduced; normal vital capacity; DLCO was reduced to 63%   CARPAL TUNNEL RELEASE     CATARACT EXTRACTION W/ INTRAOCULAR LENS  IMPLANT, BILATERAL     CHOLECYSTECTOMY  04/07/2014   dr Marlou Starks   CHOLECYSTECTOMY N/A 04/07/2014   Procedure: LAPAROSCOPIC CHOLECYSTECTOMY WITH INTRAOPERATIVE CHOLANGIOGRAM POSSBILE OPEN;  Surgeon: Autumn Messing III, MD;  Location: [facility];  Service: General;  Laterality: N/A;   COLON SURGERY  2008   partial   ESOPHAGOGASTRODUODENOSCOPY (EGD) WITH PROPOFOL N/A 05/25/2017   Procedure:  ESOPHAGOGASTRODUODENOSCOPY (EGD) WITH PROPOFOL;  Surgeon: Clarene Essex, MD;  Location: [facility];  Service: Endoscopy;  Laterality: N/A;   HERNIA REPAIR     JOINT REPLACEMENT     left hip relacement  2000   SAVORY DILATION N/A 05/25/2017   Procedure: SAVORY DILATION;  Surgeon: Clarene Essex, MD;  Location: [facility];  Service: Endoscopy;  Laterality: N/A;   TONSILLECTOMY  1935   TOTAL ABDOMINAL HYSTERECTOMY  1970   TRANSTHORACIC ECHOCARDIOGRAM  05/2011   EF=>55%; mild conc LVH; mild mitral annular calcif; mild TR; AV mildly sclerotic & mild AR   VENTRAL HERNIA REPAIR  09/19/2011   Procedure: LAPAROSCOPIC VENTRAL HERNIA;  Surgeon: Merrie Roof, MD;  Location: [facility];  Service: General;  Laterality: N/A;  laparoscopic ventral hernia repair with mesh     OB History   No obstetric history on file.     Family History  Problem Relation Age of Onset   Stroke Mother    Stroke Father    Heart block Brother    Bladder Cancer Brother    Diabetes Brother    Stroke Brother     Social History   Tobacco Use   Smoking status: Former    Packs/day: 1.00    Years: 20.00    Pack years: 20.00    Types: Cigarettes    Start date: 73    Quit date: 09/12/1978    Years since quitting: 42.1   Smokeless tobacco: Never  Vaping Use   Vaping Use: Never used  Substance Use Topics   Alcohol use: Not Currently    Alcohol/week: 0.0 standard drinks    Comment: occ   Drug use: No    Home Medications Prior to Admission medications   Medication Sig Start Date End Date Taking? Authorizing Provider  albuterol (PROVENTIL) (2.5 MG/3ML) 0.083% nebulizer solution Take 2.5 mg by nebulization 2 (two) times daily as needed for wheezing or shortness of breath.    [provider]  albuterol (VENTOLIN HFA) 108 (90 Base) MCG/ACT inhaler Inhale 2 puffs into the lungs every 4 (four) hours as needed for wheezing or shortness of breath. 08/21/20   Byrum, Rose Fillers, MD  apixaban (ELIQUIS) 2.5 MG TABS tablet  Take 1 tablet (2.5 mg total) by mouth 2 (two) times daily. 08/14/20   Hilty, Nadean Corwin, MD  Budeson-Glycopyrrol-Formoterol (BREZTRI AEROSPHERE) 160-9-4.8 MCG/ACT AERO 2 puffs    [provider]  budesonide-formoterol (SYMBICORT) 160-4.5 MCG/ACT inhaler Inhale 1 puff into the lungs 2 (two) times daily. Patient not taking: Reported on 08/14/2020 02/02/20   Collene Gobble, MD  calcium citrate-vitamin D (CITRACAL+D) 315-200 MG-UNIT per tablet Take 1 tablet by mouth daily.    [provider]  Carboxymethylcellulose Sodium (THERATEARS OP) Place 2 drops into both eyes daily.     [provider]  cholecalciferol (VITAMIN D3) 25 MCG (1000 UNIT) tablet Take 1,000 Units by mouth daily.    [provider]  diltiazem (CARDIZEM CD) 180 MG 24 hr capsule Take 180 mg by mouth daily.     [provider]  feeding supplement, ENSURE ENLIVE, (ENSURE ENLIVE) LIQD Take 237 mLs by mouth 2 (two) times daily between meals. 10/21/19   Manuella Ghazi, Pratik D, DO  fluticasone (FLONASE) 50 MCG/ACT nasal spray USE 2 SPRAY(S) IN EACH NOSTRIL ONCE DAILY IN THE EVENING Patient taking differently: Place 2 sprays into both nostrils every evening. 07/24/20   Rozetta Nunnery, MD  furosemide (LASIX) 40 MG tablet TAKE 1 TABLET BY MOUTH ONCE DAILY WITH POTASSIUM    [provider]  guaiFENesin-dextromethorphan (ROBITUSSIN DM) 100-10 MG/5ML syrup Take 5 mLs by mouth every 6 (six) hours as needed for cough. Patient not taking: Reported on 08/14/2020 01/11/19   Arrien, Jimmy Picket, MD  Lactobacillus Reuteri (BIOGAIA PROBIOTIC PO) Take 1 capsule by mouth daily.    [provider]  levothyroxine (SYNTHROID, LEVOTHROID) 137 MCG tablet Take 137 mcg by mouth daily before breakfast.     [provider]  magnesium oxide (MAG-OX) 400 MG tablet Take 400 mg by mouth daily.    [provider]  Melatonin 10 MG CAPS Take 10 mg by mouth at bedtime as needed (sleep).    [provider]  Multiple Vitamins-Minerals (MULTIVITAMIN WITH MINERALS) tablet Take 1 tablet by mouth daily.    [provider]  polyethylene glycol (MIRALAX / GLYCOLAX) packet Take 17 g by mouth daily.    [provider]  potassium chloride SA (KLOR-CON) 20 MEQ tablet TAKE 1 TABLET BY MOUTH ONCE DAILY WITH FUROSEMIDE    [provider]  predniSONE (DELTASONE) 10 MG tablet 1 tablet 12/28/19   [provider]  Tiotropium Bromide Monohydrate (SPIRIVA RESPIMAT) 2.5 MCG/ACT AERS Inhale 2 puffs into the lungs daily. Patient not taking: Reported on 08/14/2020 04/12/20   [name], NP    Allergies    Sulfa antibiotics, Sulfamethoxazole-trimethoprim, Ace inhibitors, and Elemental sulfur  Review of Systems   Review of Systems  Constitutional:  Positive for fatigue.  Respiratory:  Positive for cough and shortness of breath.   Neurological:  Positive for weakness.  Hematological:  Bruises/bleeds easily.  All other systems reviewed and are negative.  Physical Exam Updated Vital Signs BP 138/86   Pulse (!) 116   Temp 98.3 F (36.8 C) (Oral)   Resp (!) 31   SpO2 100%   Physical Exam Vitals and nursing note reviewed.  Constitutional:      General: She is not in acute distress.    Appearance: Normal appearance. She is ill-appearing.     Comments: Appears frail  HENT:     Head: Normocephalic and atraumatic.  Eyes:     Conjunctiva/sclera: Conjunctivae normal.     Pupils: Pupils are equal, round, and reactive to light.  Cardiovascular:     Rate and Rhythm: Tachycardia present. Rhythm irregular.     Pulses: Normal pulses.     Comments: Irregularly irregular. tachycardic Pulmonary:     Effort: Pulmonary effort is normal. No respiratory distress.     Breath sounds: Normal breath sounds. No wheezing.     Comments: Speaking in full sentences.  Clear lung sounds in all fields. Abdominal:     General: There is no distension.     Palpations: Abdomen is  soft. There is no mass.     Tenderness: There is no abdominal tenderness. There is no guarding or rebound.  Musculoskeletal:        General:  Normal range of motion.     Cervical back: Normal range of motion and neck supple.  Skin:    General: Skin is warm and dry.     Capillary Refill: Capillary refill takes less than 2 seconds.  Neurological:     Mental Status: She is alert and oriented to person, place, and time.  Psychiatric:        Mood and Affect: Mood and affect normal.        Speech: Speech normal.        Behavior: Behavior normal.    ED Results / Procedures / Treatments   Labs (all labs ordered are listed, but only abnormal results are displayed) Labs Reviewed  CBC WITH DIFFERENTIAL/PLATELET - Abnormal; Notable for the following components:      Result Value   WBC 11.1 (*)    Hemoglobin 15.3 (*)    Neutro Abs 8.1 (*)    All other components within normal limits  COMPREHENSIVE METABOLIC PANEL - Abnormal; Notable for the following components:   Potassium 2.8 (*)    Chloride 94 (*)    CO2 35 (*)    Glucose, Bld 123 (*)    Total Protein 5.9 (*)    All other components within normal limits  LACTIC ACID, PLASMA - Abnormal; Notable for the following components:   Lactic Acid, Venous 2.0 (*)    All other components within normal limits  RESP PANEL BY RT-PCR (FLU A&B, COVID) ARPGX2  CULTURE, BLOOD (ROUTINE X 2)  CULTURE, BLOOD (ROUTINE X 2)  BRAIN NATRIURETIC PEPTIDE  MAGNESIUM  URINALYSIS, ROUTINE W REFLEX MICROSCOPIC  MAGNESIUM  BASIC METABOLIC PANEL  CBC    EKG EKG Interpretation  Date/Time:  Monday October 22 2020 11:37:21 EDT Ventricular Rate:  107 PR Interval:    QRS Duration: 72 QT Interval:  350 QTC Calculation: 467 R Axis:   59 Text Interpretation: Sinus rhythm with premature ventricular or aberrantly conducted complexes Septal infarct , age undetermined Abnormal ECG since last tracing no significant change Confirmed by Daleen Bo [phone]) on 10/22/2020  3:34:36 PM  Radiology DG Chest 2 View  Result Date: 10/22/2020 CLINICAL DATA:  Shortness of breath and chest tightness. Status post treatment for pneumonia. EXAM: CHEST - 2 VIEW COMPARISON:  02/27/2020 FINDINGS: Stable cardiomediastinal contours. Aortic atherosclerotic calcifications. There is a small to moderate right pleural effusion. Lungs are hyperinflated and there are coarsened interstitial markings compatible with emphysema. Bones appear diffusely osteopenic. Multi level compression fractures are identified throughout the thoracic spine, similar to previous exam. IMPRESSION: 1. Small to moderate right pleural effusion. 2. Emphysema. Electronically Signed   By: Kerby Moors M.D.   On: 10/22/2020 12:30    Procedures Procedures   Medications Ordered in ED Medications  potassium chloride 10 mEq in 100 mL IVPB (10 mEq Intravenous New Bag/Given 10/22/20 1743)  potassium chloride SA (KLOR-CON) CR tablet 40 mEq (has no administration in time range)  azithromycin (ZITHROMAX) tablet 500 mg (has no administration in time range)  cefTRIAXone (ROCEPHIN) 2 g in sodium chloride 0.9 % 100 mL IVPB (has no administration in time range)  diltiazem (CARDIZEM CD) 24 hr capsule 180 mg (has no administration in time range)  levothyroxine (SYNTHROID) tablet 137 mcg (has no administration in time range)  magnesium oxide (MAG-OX) tablet 400 mg (has no administration in time range)  polyethylene glycol (MIRALAX / GLYCOLAX) packet 17 g (has no administration in time range)  apixaban (ELIQUIS) tablet 2.5 mg (has no administration in time range)  Melatonin CAPS 10 mg (has no administration in time range)  albuterol (PROVENTIL) (2.5 MG/3ML) 0.083% nebulizer solution 2.5 mg (has no administration in time range)  albuterol (VENTOLIN HFA) 108 (90 Base) MCG/ACT inhaler 2 puff (has no administration in time range)  mometasone-formoterol (DULERA) 200-5 MCG/ACT inhaler 2 puff (has no administration in time range)   guaiFENesin-dextromethorphan (ROBITUSSIN DM) 100-10 MG/5ML syrup 5 mL (has no administration in time range)  Tiotropium Bromide Monohydrate AERS 2 puff (has no administration in time range)  azithromycin (ZITHROMAX) 500 mg in sodium chloride 0.9 % 250 mL IVPB (500 mg Intravenous New Bag/Given 10/22/20 1731)    ED Course  I have reviewed the triage vital signs and the nursing notes.  Pertinent labs & imaging results that were available during my care of the patient were reviewed by me and considered in my medical decision making (see chart for details).    MDM Rules/Calculators/A&P                           Patient presenting for evaluation of shortness of breath, cough, weakness, fatigue.  Symptoms worsened significantly last 24 hours, after finishing antibiotics.  She was recently treated for pneumonia.  On exam, patient appears frail.  She is tachycardic with an irregularly irregular rhythm.  She has needed oxygen more often, sats are in the 100s on 3 L.  Labs obtained from triage interpreted by me, mild leukocytosis of 11.  Lactic is minimally elevated, however unclear whether there is infection versus fluid.  Chest x-ray viewed and independently interpreted by me, shows a effusion on the right side.  This may be masking a pneumonia, will cover with antibiotics but hold off on code sepsis.  Fluid may need to be sampled to determine whether it is infected.  EKG shows irregularly irregular tachycardic rhythm, but not A. fib.  COVID and flu are negative.  Electrolytes show hypokalemia, will replenish with IV and check magnesium.  BNP is normal.  Will call for admission for further management.  Discussed with Dr. Tawanna Solo from triad hospitalist service, pt to be admitted.   Final Clinical Impression(s) / ED Diagnoses Final diagnoses:  Pleural effusion  Fatigue, unspecified type  Tachycardia  Hypokalemia    Rx / DC Orders ED Discharge Orders     None        Franchot Heidelberg,  PA-C 10/22/20 1856    Daleen Bo, MD 10/23/20 [phone]

## 2020-10-23 ENCOUNTER — Inpatient Hospital Stay (HOSPITAL_COMMUNITY): Payer: Medicare HMO

## 2020-10-23 DIAGNOSIS — J9611 Chronic respiratory failure with hypoxia: Secondary | ICD-10-CM | POA: Diagnosis not present

## 2020-10-23 DIAGNOSIS — E039 Hypothyroidism, unspecified: Secondary | ICD-10-CM

## 2020-10-23 DIAGNOSIS — J449 Chronic obstructive pulmonary disease, unspecified: Secondary | ICD-10-CM | POA: Diagnosis not present

## 2020-10-23 DIAGNOSIS — R5383 Other fatigue: Secondary | ICD-10-CM

## 2020-10-23 LAB — CBC
HCT: 45 % (ref 36.0–46.0)
Hemoglobin: 14.3 g/dL (ref 12.0–15.0)
MCH: 32.1 pg (ref 26.0–34.0)
MCHC: 31.8 g/dL (ref 30.0–36.0)
MCV: 100.9 fL — ABNORMAL HIGH (ref 80.0–100.0)
Platelets: 287 10*3/uL (ref 150–400)
RBC: 4.46 MIL/uL (ref 3.87–5.11)
RDW: 15 % (ref 11.5–15.5)
WBC: 10.6 10*3/uL — ABNORMAL HIGH (ref 4.0–10.5)
nRBC: 0 % (ref 0.0–0.2)

## 2020-10-23 LAB — BASIC METABOLIC PANEL
Anion gap: 10 (ref 5–15)
BUN: 8 mg/dL (ref 8–23)
CO2: 30 mmol/L (ref 22–32)
Calcium: 8.6 mg/dL — ABNORMAL LOW (ref 8.9–10.3)
Chloride: 101 mmol/L (ref 98–111)
Creatinine, Ser: 0.68 mg/dL (ref 0.44–1.00)
GFR, Estimated: >60 mL/min (ref >60)
Glucose, Bld: 104 mg/dL — ABNORMAL HIGH (ref 70–99)
Potassium: 2.8 mmol/L — ABNORMAL LOW (ref 3.5–5.1)
Sodium: 141 mmol/L (ref 135–145)

## 2020-10-23 LAB — PROCALCITONIN: Procalcitonin: 45.11 ng/mL

## 2020-10-23 MED ORDER — POTASSIUM CHLORIDE 10 MEQ/100ML IV SOLN
10.0000 meq | INTRAVENOUS | Status: AC
Start: 2020-10-23 — End: 2020-10-23
  Administered 2020-10-23: 10 meq via INTRAVENOUS
  Filled 2020-10-23: qty 100

## 2020-10-23 MED ORDER — POTASSIUM CHLORIDE CRYS ER 20 MEQ PO TBCR
40.0000 meq | EXTENDED_RELEASE_TABLET | Freq: Two times a day (BID) | ORAL | Status: AC
  Administered 2020-10-23 (×2): 40 meq via ORAL
  Filled 2020-10-23 (×2): qty 2

## 2020-10-23 MED ORDER — METHYLPREDNISOLONE SODIUM SUCC 125 MG IJ SOLR
60.0000 mg | Freq: Two times a day (BID) | INTRAMUSCULAR | Status: DC
  Administered 2020-10-23 – 2020-10-26 (×6): 60 mg via INTRAVENOUS
  Filled 2020-10-23 (×6): qty 2

## 2020-10-23 MED ORDER — FUROSEMIDE 10 MG/ML IJ SOLN
40.0000 mg | Freq: Once | INTRAMUSCULAR | Status: AC
  Administered 2020-10-23: 40 mg via INTRAVENOUS
  Filled 2020-10-23: qty 4

## 2020-10-23 NOTE — Evaluation (Signed)
Physical Therapy Evaluation Patient Details Name: Heather [name] MRN: [id number] DOB: 11-30-1926 Today's Date: 10/23/2020   History of Present Illness  Pt is a 87 y/o female admitted 7/25 secondary to SOB and weakness. Found to have PNA. PMH includes COPD on 2L, a fib, dCHF, and HTN.  Clinical Impression  Pt admitted secondary to problem above with deficits below. Pt requiring min A for bed mobility and min guard to min A to stand and take side steps at EOB. Pt limited secondary to fatigue. Oxygen sats >95% on 5L of O2. Pt reports family can assist as needed at home and can provide physical assist if pt requires at d/c. Reports she has a transport chair she can use for mobility if needed as well. Will continue to follow acutely.     Follow Up Recommendations Home health PT;Supervision/Assistance - 24 hour    Equipment Recommendations  None recommended by PT    Recommendations for Other Services       Precautions / Restrictions Precautions Precautions: Fall Restrictions Weight Bearing Restrictions: No      Mobility  Bed Mobility Overal bed mobility: Needs Assistance Bed Mobility: Supine to Sit;Sit to Supine     Supine to sit: Min assist Sit to supine: Min assist   General bed mobility comments: Min A For trunk and LE assist throughout mobility tasks. Increased time required. DOE at 2/4 throughout.    Transfers Overall transfer level: Needs assistance Equipment used: 1 person hand held assist Transfers: Sit to/from Stand Sit to Stand: Min guard         General transfer comment: Min guard for safety to stand from higher stretcher height.  Ambulation/Gait Ambulation/Gait assistance: Min assist;Min guard   Assistive device: 1 person hand held assist       General Gait Details: Took side steps at EOB. Mild unsteadiness noted and increased fatigue. Oxygen sats >95% on 5L of O2.  Stairs            Wheelchair Mobility    Modified Rankin (Stroke Patients  Only)       Balance Overall balance assessment: Needs assistance Sitting-balance support: No upper extremity supported;Feet supported Sitting balance-Leahy Scale: Fair     Standing balance support: Single extremity supported Standing balance-Leahy Scale: Poor Standing balance comment: Reliant on at least 1 UE support                             Pertinent Vitals/Pain Pain Assessment: No/denies pain    Home Living Family/patient expects to be discharged to:: Private residence Living Arrangements: Children Available Help at Discharge: Family;Available 24 hours/day Type of Home: House Home Access: Ramped entrance     Home Layout: One level Home Equipment: Grab bars - toilet;Grab bars - tub/shower;Walker - 2 wheels;Cane - single point;Walker - 4 wheels;Shower seat;Transport chair;Other (comment) (oxygen) Additional Comments: On 3L at home    Prior Function Level of Independence: Needs assistance   Gait / Transfers Assistance Needed: mod I with rollator, daughter supervises shower transfer  ADL's / Homemaking Assistance Needed: Daughter assists with ADLs        Hand Dominance        Extremity/Trunk Assessment   Upper Extremity Assessment Upper Extremity Assessment: Defer to OT evaluation    Lower Extremity Assessment Lower Extremity Assessment: Generalized weakness    Cervical / Trunk Assessment Cervical / Trunk Assessment: Kyphotic  Communication   Communication: HOH  Cognition Arousal/Alertness: Awake/alert Behavior  During Therapy: WFL for tasks assessed/performed Overall Cognitive Status: No family/caregiver present to determine baseline cognitive functioning                                        General Comments General comments (skin integrity, edema, etc.): VSS throughout on 5L via [place].    Exercises     Assessment/Plan    PT Assessment Patient needs continued PT services  PT Problem List Decreased strength;Decreased  activity tolerance;Decreased balance;Decreased mobility;Decreased knowledge of use of DME;Decreased knowledge of precautions;Cardiopulmonary status limiting activity       PT Treatment Interventions DME instruction;Gait training;Functional mobility training;Therapeutic activities;Therapeutic exercise;Balance training;Patient/family education    PT Goals (Current goals can be found in the Care Plan section)  Acute Rehab PT Goals Patient Stated Goal: to go home PT Goal Formulation: With patient Time For Goal Achievement: 11/06/20 Potential to Achieve Goals: Good    Frequency Min 3X/week   Barriers to discharge        Co-evaluation               AM-PAC PT "6 Clicks" Mobility  Outcome Measure Help needed turning from your back to your side while in a flat bed without using bedrails?: None Help needed moving from lying on your back to sitting on the side of a flat bed without using bedrails?: A Little Help needed moving to and from a bed to a chair (including a wheelchair)?: A Little Help needed standing up from a chair using your arms (e.g., wheelchair or bedside chair)?: A Little Help needed to walk in hospital room?: A Little Help needed climbing 3-5 steps with a railing? : A Lot 6 Click Score: 18    End of Session Equipment Utilized During Treatment: Gait belt;Oxygen Activity Tolerance: Patient limited by fatigue Patient left: in bed;with call bell/phone within reach (on stretcher in ED) Nurse Communication: Mobility status PT Visit Diagnosis: Unsteadiness on feet (R26.81);Muscle weakness (generalized) (M62.81)    Time: 2103-1281 PT Time Calculation (min) (ACUTE ONLY): 17 min   Charges:   PT Evaluation $PT Eval Moderate Complexity: 1 Mod          Reuel Derby, PT, DPT  Acute Rehabilitation Services  Pager: [phone] Office: [phone]   Rudean Hitt 10/23/2020, 10:15 AM

## 2020-10-23 NOTE — ED Notes (Signed)
Speech therapist currently in the unit. Updated about pt at this time.

## 2020-10-23 NOTE — Evaluation (Signed)
Clinical/Bedside Swallow Evaluation Patient Details  Name: SHEYNA PETTIBONE MRN: [id number] Date of Birth: 07-17-1926  Today's Date: 10/23/2020 Time: SLP Start Time (ACUTE ONLY): 0950 SLP Stop Time (ACUTE ONLY): 1010 SLP Time Calculation (min) (ACUTE ONLY): 20 min  Past Medical History:  Past Medical History:  Diagnosis Date   Anxiety    Arthritis    Atrial fibrillation ([place])    Cerumen impaction    COPD (chronic obstructive pulmonary disease) (HCC)    Dysrhythmia    AF   Essential hypertension, benign    Gallstones    GERD (gastroesophageal reflux disease)    H/O hiatal hernia    History of nuclear stress test 10/2010   dipyridamole; normal pattern of perfusion; low risk, normal study   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    Mild aortic insufficiency    Osteoporosis    Peripheral neuropathy    Pneumonia    states she had it twice, last time a couple of months ago   PONV (postoperative nausea and vomiting)    Shortness of breath    with exertion   Unspecified hypothyroidism    Venous insufficiency    Past Surgical History:  Past Surgical History:  Procedure Laterality Date   APPENDECTOMY  1935   BACK SURGERY     x2   Cardiometablic Testing  5/42/7062   submaximal effort with peak RER of 0.5, peak VO2 79% predicted; HR peak up to 78%; PVC was 55% predicted, PEV1 39% predicted; PEV1/VC ratio was reduced; normal vital capacity; DLCO was reduced to 63%   CARPAL TUNNEL RELEASE     CATARACT EXTRACTION W/ INTRAOCULAR LENS  IMPLANT, BILATERAL     CHOLECYSTECTOMY  04/07/2014   dr Marlou Starks   CHOLECYSTECTOMY N/A 04/07/2014   Procedure: LAPAROSCOPIC CHOLECYSTECTOMY WITH INTRAOPERATIVE CHOLANGIOGRAM POSSBILE OPEN;  Surgeon: Autumn Messing III, MD;  Location: [facility];  Service: General;  Laterality: N/A;   COLON SURGERY  2008   partial   ESOPHAGOGASTRODUODENOSCOPY (EGD) WITH PROPOFOL N/A 05/25/2017   Procedure: ESOPHAGOGASTRODUODENOSCOPY (EGD) WITH PROPOFOL;   Surgeon: Clarene Essex, MD;  Location: [facility] ENDOSCOPY;  Service: Endoscopy;  Laterality: N/A;   HERNIA REPAIR     JOINT REPLACEMENT     left hip relacement  2000   SAVORY DILATION N/A 05/25/2017   Procedure: SAVORY DILATION;  Surgeon: Clarene Essex, MD;  Location: [place];  Service: Endoscopy;  Laterality: N/A;   TONSILLECTOMY  1935   TOTAL ABDOMINAL HYSTERECTOMY  1970   TRANSTHORACIC ECHOCARDIOGRAM  05/2011   EF=>55%; mild conc LVH; mild mitral annular calcif; mild TR; AV mildly sclerotic & mild AR   VENTRAL HERNIA REPAIR  09/19/2011   Procedure: LAPAROSCOPIC VENTRAL HERNIA;  Surgeon: Merrie Roof, MD;  Location: [facility] OR;  Service: General;  Laterality: N/A;  laparoscopic ventral hernia repair with mesh   HPI:  INGA NOLLER is a 87 y.o. female  presented to the emergency department  with complaints of increased weakness, fatigue, shortness of breath.  Pt has a history of cricopharyngeal dysphagia with globus sensation with soldis and liquids, effortful swallowing, use of liquid wash. Has been recommended to consume mech soft solids and thin liquids. Had an esophagram in 05/04/20 that showed no evidence of laryngeal penetration or tracheobronchial  aspiration. Mild barium retention in the vallecula. Mild cricopharyngeus muscle dysfunction, characteristic of  chronic gastroesophageal reflux disease, unchanged.  Mild presbyesophagus, unchanged.  No gross evidence of esophageal mass or stricture. Laryngoscopy 05/22/20: no obvious abnormalities noted of  the mucosal membranes. PMH: with medical history significant of COPD, paroxysmal A. Fib, chronic hypoxic respiratory failure on 2 L of oxygen per minute at home,diastolic congestive heart failure, pulmonary nodule, abdominal aortic aneurysm, hypothyroidism, hypertension.   Assessment / Plan / Recommendation Clinical Impression  Pt demonstrates adequate oral and oropharyngeal function subjectively on clinical assessment. She does report globus after only a  few bites or sips, which she reports is typical for her. She has a prior diagnosis of cervical esophageal hypertension and esophageal dysmotility. Her esophagus has already been stretched this year without relief. Pt verbalizes all appropriate strategies and reports she has to eat slowly, small frequent meals, avoid meats, drink water with meals etc. SLP was only able to suggest increased oral hygiene after use of breathing treatments and inhalers to combat mucosal changes from these medications, which can cause dry mouth, sensory changes, thrush and in extreme cases, dysphagia. Reported to MD. Pt to continue unrestricted diet as she is able to make appropriate food choices. No SLP f/u needed as pt is independent and all education is complete. SLP Visit Diagnosis: Dysphagia, unspecified (R13.10)    Aspiration Risk  Mild aspiration risk    Diet Recommendation Regular;Thin liquid   Liquid Administration via: Cup;Straw Medication Administration: Whole meds with liquid (ok to try meds whole inpuree if needed, or follow pills with puree) Supervision: Patient able to self feed Compensations: Slow rate;Small sips/bites Postural Changes: Seated upright at 90 degrees;Remain upright for at least 30 minutes after po intake    Other  Recommendations Oral Care Recommendations: Other (Comment) (oral care after breathing treatments)   Follow up Recommendations None      Frequency and Duration            Prognosis        Swallow Study   General HPI: AZORA BONZO is a 87 y.o. female  presented to the emergency department  with complaints of increased weakness, fatigue, shortness of breath.  Pt has a history of cricopharyngeal dysphagia with globus sensation with soldis and liquids, effortful swallowing, use of liquid wash. Has been recommended to consume mech soft solids and thin liquids. Had an esophagram in 05/04/20 that showed no evidence of laryngeal penetration or tracheobronchial  aspiration. Mild  barium retention in the vallecula. Mild cricopharyngeus muscle dysfunction, characteristic of  chronic gastroesophageal reflux disease, unchanged.  Mild presbyesophagus, unchanged.  No gross evidence of esophageal mass or stricture. Laryngoscopy 05/22/20: no obvious abnormalities noted of the mucosal membranes. PMH: with medical history significant of COPD, paroxysmal A. Fib, chronic hypoxic respiratory failure on 2 L of oxygen per minute at home,diastolic congestive heart failure, pulmonary nodule, abdominal aortic aneurysm, hypothyroidism, hypertension. Type of Study: Bedside Swallow Evaluation Previous Swallow Assessment: see impression Diet Prior to this Study: Regular;Thin liquids Temperature Spikes Noted: No Respiratory Status: Nasal cannula History of Recent Intubation: No Behavior/Cognition: Alert;Cooperative;Pleasant mood Oral Cavity Assessment: Other (comment) (white patches, complains of dry mouth) Oral Care Completed by SLP: No Oral Cavity - Dentition: Adequate natural dentition Vision: Functional for self-feeding Self-Feeding Abilities: Able to feed self Patient Positioning: Upright in bed Baseline Vocal Quality: Normal Volitional Cough: Strong Volitional Swallow: Able to elicit    Oral/Motor/Sensory Function Overall Oral Motor/Sensory Function: Within functional limits   Ice Chips     Thin Liquid Thin Liquid: Within functional limits Presentation: Self Fed    Nectar Thick Nectar Thick Liquid: Not tested   Honey Thick Honey Thick Liquid: Not tested   Puree Puree: Within  functional limits   Solid     Solid: Within functional limits Presentation: Self Fed      Olanda Boughner, Katherene Ponto 10/23/2020,11:18 AM

## 2020-10-23 NOTE — ED Notes (Signed)
Pt ate 2 bites of food and started choking. Pt reports, "Something is stuck in my throat." Pt turned red and gagging herself. Nightshift RN also reported pt was choking when pt swallowed medications last night. Unable to swallow meds at this time. O2 at 99%. Pt spit out the piece of food at this time. Choking problem relieved after multiple attempts.

## 2020-10-23 NOTE — ED Notes (Signed)
Breakfast Order Placed ?

## 2020-10-23 NOTE — Progress Notes (Signed)
PROGRESS NOTE                                                                                                                                                                                                             Patient Demographics:    Heather [name], is a 87 y.o. female, DOB - 14-Mar-1927, BVQ:[id number]  Outpatient Primary MD for the patient is Burnard Bunting, MD    LOS - 1  Admit date - 10/22/2020    CC - SOB     Brief Narrative (HPI from H&P)  - Heather [name] is a 87 y.o. female with medical history significant of COPD, paroxysmal A. Fib, chronic hypoxic respiratory failure on 2 L of oxygen per minute at home,diastolic congestive heart failure, pulmonary nodule, abdominal aortic aneurysm, hypothyroidism, hypertension who presented to the emergency department  with complaints of increased weakness, fatigue, shortness of breath, was diagnosed with CAP, mild R. Pl. effusion and Hypoxia.   Subjective:    [name] today has, No headache, No chest pain, No abdominal pain - No Nausea, No new weakness tingling or numbness, +ve cough and SOB.   Assessment  & Plan :    1. Acute on Chronic Hypoxic Resp. Failure due to CAP along with mild acute on chronic COPD exacerbation - she is currently on IV antibiotics which will be continued, will add IV steroids for wheezing and COPD exacerbation.  She uses 3 L of nasal cannula oxygen at home and currently at 5, continue with above dictated plan and monitor closely.  Have strongly encouraged the patient to sit up in chair in the daytime use I-S and flutter valve for pulmonary toiletry.  Will advance activity and titrate down oxygen as possible.  2.  Paroxysmal A. fib [place] vas 2 score of greater than 3.  Currently on diltiazem and Eliquis continue goal will be rate control.  3.  Mild right-sided pleural effusion.  Not enough to be tapped.  Failed attempt by IR.  Diurese and  monitor.  4.  Acute on chronic diastolic CHF EF 80% on recent echocardiogram gentle Lasix 1 more dose and monitor.  5.  Hypokalemia.  Replaced IV and oral.  6.  Hypothyroidism.  Continue home dose Synthroid recheck TSH tomorrow.  7.  Deconditioning.  PT OT.    8. History of abdominal aortic  aneurysm.  Outpatient PCP monitoring and age-appropriate follow-up.        Condition - Extremely Guarded  Family Communication  :  daughter [name] [phone] - 10/23/20  Code Status :  DNR  Consults  :  None  PUD Prophylaxis :    Procedures  :            Disposition Plan  :    Status is: Inpatient  Remains inpatient appropriate because:IV treatments appropriate due to intensity of illness or inability to take PO  Dispo: The patient is from: Home              Anticipated d/c is to: Home              Patient currently is not medically stable to d/c.   Difficult to place patient No   DVT Prophylaxis  :    apixaban (ELIQUIS) tablet 2.5 mg Start: 10/22/20 2200 SCDs Start: 10/22/20 1712 apixaban (ELIQUIS) tablet 2.5 mg     Lab Results  Component Value Date   PLT 287 10/23/2020    Diet :  Diet Order             Diet Heart Room service appropriate? Yes; Fluid consistency: Thin  Diet effective now                    Inpatient Medications  Scheduled Meds:  apixaban  2.5 mg Oral BID   azithromycin  500 mg Oral Daily   diltiazem  180 mg Oral Daily   levothyroxine  137 mcg Oral QAC breakfast   magnesium oxide  400 mg Oral Daily   methylPREDNISolone (SOLU-MEDROL) injection  60 mg Intravenous Q12H   mometasone-formoterol  2 puff Inhalation BID   polyethylene glycol  17 g Oral Daily   potassium chloride  40 mEq Oral BID   umeclidinium bromide  1 puff Inhalation Daily   Continuous Infusions:  cefTRIAXone (ROCEPHIN)  IV Stopped (10/23/20 0006)   potassium chloride     PRN Meds:.albuterol, albuterol, guaiFENesin-dextromethorphan, melatonin  Antibiotics  :     Anti-infectives (From admission, onward)    Start     Dose/Rate Route Frequency Ordered Stop   10/23/20 1000  azithromycin (ZITHROMAX) tablet 500 mg        500 mg Oral Daily 10/22/20 1708 10/27/20 0959   10/22/20 1800  cefTRIAXone (ROCEPHIN) 2 g in sodium chloride 0.9 % 100 mL IVPB        2 g 200 mL/hr over 30 Minutes Intravenous Every 24 hours 10/22/20 1708 10/26/20 1759   10/22/20 1630  cefTRIAXone (ROCEPHIN) 1 g in sodium chloride 0.9 % 100 mL IVPB  Status:  Discontinued        1 g 200 mL/hr over 30 Minutes Intravenous  Once 10/22/20 1624 10/22/20 1709   10/22/20 1630  azithromycin (ZITHROMAX) 500 mg in sodium chloride 0.9 % 250 mL IVPB        500 mg 250 mL/hr over 60 Minutes Intravenous  Once 10/22/20 1624 10/22/20 2117        Time Spent in minutes  30   Lala Lund M.D on 10/23/2020 at 1:31 PM  To page go to [email or web address]   [facility] -  Office  [phone]    See all Orders from today for further details    Objective:   Vitals:   10/23/20 1000 10/23/20 1045 10/23/20 1215 10/23/20 1315  BP: 112/73 135/84 112/71 125/73  Pulse: 95 95 96 (!) 105  Resp: 18 (!) 32 (!) 30 18  Temp:      TempSrc:      SpO2: 98% 98% 98% 96%    Wt Readings from Last 3 Encounters:  08/14/20 47.1 kg  07/30/20 51.2 kg  03/14/20 46.7 kg     Intake/Output Summary (Last 24 hours) at 10/23/2020 1331 Last data filed at 10/23/2020 0006 Gross per 24 hour  Intake 367.8 ml  Output --  Net 367.8 ml     Physical Exam  Awake Alert, No new F.N deficits, Normal affect Agency.AT,PERRAL Supple Neck,No JVD, No cervical lymphadenopathy appriciated.  Symmetrical Chest wall movement, Good air movement bilaterally, few R. Rales and some wheezing RRR,No Gallops,Rubs or new Murmurs, No Parasternal Heave +ve B.Sounds, Abd Soft, No tenderness, No organomegaly appriciated, No rebound - guarding or rigidity. No Cyanosis, Clubbing or edema, No new Rash or bruise        RN pressure injury  documentation: Pressure Injury 02/28/20 Back Other (Comment) Stage 1 -  Intact skin with non-blanchable redness of a localized area usually over a bony prominence. spine and coccyx (Active)  02/28/20 0910  Location: Back  Location Orientation: Other (Comment)  Staging: Stage 1 -  Intact skin with non-blanchable redness of a localized area usually over a bony prominence.  Wound Description (Comments): spine and coccyx  Present on Admission: Yes     Data Review:    CBC Recent Labs  Lab 10/22/20 1150 10/23/20 0335  WBC 11.1* 10.6*  HGB 15.3* 14.3  HCT 45.2 45.0  PLT 330 287  MCV 96.2 100.9*  MCH 32.6 32.1  MCHC 33.8 31.8  RDW 14.6 15.0  LYMPHSABS 2.2  --   MONOABS 0.7  --   EOSABS 0.0  --   BASOSABS 0.1  --     Recent Labs  Lab 10/22/20 1150 10/22/20 1151 10/22/20 1710 10/23/20 0335  NA 139  --   --  141  K 2.8*  --   --  2.8*  CL 94*  --   --  101  CO2 35*  --   --  30  GLUCOSE 123*  --   --  104*  BUN 11  --   --  8  CREATININE 0.82  --   --  0.68  CALCIUM 9.3  --   --  8.6*  AST 27  --   --   --   ALT 18  --   --   --   ALKPHOS 97  --   --   --   BILITOT 0.9  --   --   --   ALBUMIN 3.7  --   --   --   MG  --   --  2.2  --   LATICACIDVEN  --  2.0*  --   --   BNP 62.2  --   --   --     ------------------------------------------------------------------------------------------------------------------ No results for input(s): CHOL, HDL, LDLCALC, TRIG, CHOLHDL, LDLDIRECT in the last 72 hours.  No results found for: HGBA1C ------------------------------------------------------------------------------------------------------------------ No results for input(s): TSH, T4TOTAL, T3FREE, THYROIDAB in the last 72 hours.  Invalid input(s): FREET3  Cardiac Enzymes No results for input(s): CKMB, TROPONINI, MYOGLOBIN in the last 168 hours.  Invalid input(s):  CK ------------------------------------------------------------------------------------------------------------------    Component Value Date/Time   BNP 62.2 10/22/2020 1150    Micro Results  Radiology Reports DG Chest 2 View  Result Date: 10/22/2020 CLINICAL DATA:  Shortness of breath and chest tightness. Status  post treatment for pneumonia. EXAM: CHEST - 2 VIEW COMPARISON:  02/27/2020 FINDINGS: Stable cardiomediastinal contours. Aortic atherosclerotic calcifications. There is a small to moderate right pleural effusion. Lungs are hyperinflated and there are coarsened interstitial markings compatible with emphysema. Bones appear diffusely osteopenic. Multi level compression fractures are identified throughout the thoracic spine, similar to previous exam. IMPRESSION: 1. Small to moderate right pleural effusion. 2. Emphysema. Electronically Signed   By: Kerby Moors M.D.   On: 10/22/2020 12:30

## 2020-10-23 NOTE — Progress Notes (Addendum)
Patient presented to radiology for thoracentesis today.   Right pleural space visualized with ultrasound.  No fluid collection for safe approach found.  Ultrasound images saved for documentation purpose.   Thoracentesis was NOT performed.  Sent a secure chat to the attending provider, Dr. Candiss Norse.    Armando Gang Darrick Greenlaw PA-C 10/23/2020 11:38 AM

## 2020-10-24 DIAGNOSIS — J90 Pleural effusion, not elsewhere classified: Secondary | ICD-10-CM | POA: Diagnosis not present

## 2020-10-24 DIAGNOSIS — J189 Pneumonia, unspecified organism: Secondary | ICD-10-CM | POA: Diagnosis not present

## 2020-10-24 DIAGNOSIS — J9611 Chronic respiratory failure with hypoxia: Secondary | ICD-10-CM | POA: Diagnosis not present

## 2020-10-24 DIAGNOSIS — I480 Paroxysmal atrial fibrillation: Secondary | ICD-10-CM | POA: Diagnosis not present

## 2020-10-24 LAB — PROCALCITONIN: Procalcitonin: 51.51 ng/mL

## 2020-10-24 MED ORDER — FUROSEMIDE 10 MG/ML IJ SOLN
40.0000 mg | Freq: Once | INTRAMUSCULAR | Status: AC
  Administered 2020-10-24: 40 mg via INTRAVENOUS
  Filled 2020-10-24: qty 4

## 2020-10-24 NOTE — Progress Notes (Signed)
Patient ID: Heather [name], female   DOB: Aug 20, 1926, 87 y.o.   MRN: [id number]  PROGRESS NOTE    Heather [name]  PXT:[id number] DOB: 04/04/1926 DOA: 10/22/2020 PCP: Burnard Bunting, MD   Brief Narrative:  87 y.o. female with medical history significant of COPD, paroxysmal A. Fib, chronic hypoxic respiratory failure on 2 L of oxygen per minute at home,diastolic congestive heart failure, pulmonary nodule, abdominal aortic aneurysm, hypothyroidism, hypertension presented to the emergency department  with complaints of increased weakness, fatigue, shortness of breath and was diagnosed with community-acquired pneumonia along with right pleural effusion and hypoxia.  Assessment & Plan:   Acute on chronic hypoxic respiratory failure Community-acquired pneumonia Possible COPD exacerbation -Normally uses 2 L oxygen via nasal cannula at home.  Currently on 5 L oxygen via nasal cannula and struggling to breathe.  Wean off as able. -Continue Rocephin and Zithromax.  Cultures negative so far.  COVID and influenza negative on admission -Continue IV Solu-Medrol.  Continue Dulera and nebs.  Acute on chronic diastolic CHF -EF 62% on most recent echocardiogram.  Strict input and output.  Daily weights.  Will give a dose of IV Lasix today as well.  Hypokalemia -No labs today.  Repeat a.m. labs  Mild right sided pleural effusion -Not enough to be tapped.  Failed attempt by IR.  Continue diuresis  Hypertension -continue Cardizem  Paroxysmal A. Fib -Currently rate controlled.  Continue Cardizem.  Continue Eliquis  Leukocytosis -Resolved  Hypothyroidism -Continue Synthroid  Generalized deconditioning -PT recommends home health PT/OT  History of abdominal aortic aneurysm -Outpatient PCP follow-up and is appropriate follow-up.   DVT prophylaxis: Eliquis Code Status: DNR Family Communication: None at bedside Disposition Plan: Status is: Inpatient  Remains inpatient appropriate  because:Inpatient level of care appropriate due to severity of illness  Dispo: The patient is from: Home              Anticipated d/c is to: Home              Patient currently is not medically stable to d/c.   Difficult to place patient No  Consultants: None  Procedures: None  Antimicrobials: Rocephin and Zithromax   Subjective: Patient seen and examined at bedside.  She is very short of breath with minimal exertion.  Does not feel well enough to go home today.  No overnight fever, vomiting reported.  Cough is slightly improving.  Objective: Vitals:   10/23/20 1737 10/23/20 1850 10/23/20 1915 10/24/20 0330  BP: (!) 145/74 (!) 152/74 (!) 109/51 100/63  Pulse: 85 95 90 90  Resp: 18 15 20 20   Temp: 98.1 F (36.7 C) 98 F (36.7 C) 97.7 F (36.5 C) 97.7 F (36.5 C)  TempSrc: Oral Oral Oral Oral  SpO2: 98% 96% 100% 99%  Weight:   44.7 kg     Intake/Output Summary (Last 24 hours) at 10/24/2020 1143 Last data filed at 10/23/2020 1555 Gross per 24 hour  Intake 100 ml  Output --  Net 100 ml   Filed Weights   10/23/20 1915  Weight: 44.7 kg    Examination:  General exam: Appears calm and comfortable.  Elderly female sitting on chair.  Currently on 5 L oxygen via nasal cannula. Respiratory system: Bilateral decreased breath sounds at bases with scattered crackles and tachypnea Cardiovascular system: S1 & S2 heard, Rate controlled Gastrointestinal system: Abdomen is nondistended, soft and nontender. Normal bowel sounds heard. Extremities: No cyanosis, clubbing; trace lower extremity edema  Central nervous system: Alert  and oriented. No focal neurological deficits. Moving extremities Skin: No rashes, lesions or ulcers Psychiatry: Judgement and insight appear normal. Mood & affect appropriate.     Data Reviewed: I have personally reviewed following labs and imaging studies  CBC: Recent Labs  Lab 10/22/20 1150 10/23/20 0335  WBC 11.1* 10.6*  NEUTROABS 8.1*  --   HGB  15.3* 14.3  HCT 45.2 45.0  MCV 96.2 100.9*  PLT 330 253   Basic Metabolic Panel: Recent Labs  Lab 10/22/20 1150 10/22/20 1710 10/23/20 0335  NA 139  --  141  K 2.8*  --  2.8*  CL 94*  --  101  CO2 35*  --  30  GLUCOSE 123*  --  104*  BUN 11  --  8  CREATININE 0.82  --  0.68  CALCIUM 9.3  --  8.6*  MG  --  2.2  --    GFR: Estimated Creatinine Clearance: 32.3 mL/min (by C-G formula based on SCr of 0.68 mg/dL). Liver Function Tests: Recent Labs  Lab 10/22/20 1150  AST 27  ALT 18  ALKPHOS 97  BILITOT 0.9  PROT 5.9*  ALBUMIN 3.7   No results for input(s): LIPASE, AMYLASE in the last 168 hours. No results for input(s): AMMONIA in the last 168 hours. Coagulation Profile: No results for input(s): INR, PROTIME in the last 168 hours. Cardiac Enzymes: No results for input(s): CKTOTAL, CKMB, CKMBINDEX, TROPONINI in the last 168 hours. BNP (last 3 results) No results for input(s): PROBNP in the last 8760 hours. HbA1C: No results for input(s): HGBA1C in the last 72 hours. CBG: No results for input(s): GLUCAP in the last 168 hours. Lipid Profile: No results for input(s): CHOL, HDL, LDLCALC, TRIG, CHOLHDL, LDLDIRECT in the last 72 hours. Thyroid Function Tests: No results for input(s): TSH, T4TOTAL, FREET4, T3FREE, THYROIDAB in the last 72 hours. Anemia Panel: No results for input(s): VITAMINB12, FOLATE, FERRITIN, TIBC, IRON, RETICCTPCT in the last 72 hours. Sepsis Labs: Recent Labs  Lab 10/22/20 1151 10/23/20 1326 10/24/20 0125  PROCALCITON  --  45.11 51.51  LATICACIDVEN 2.0*  --   --     Recent Results (from the past 240 hour(s))  Resp Panel by RT-PCR (Flu A&B, Covid) Nasopharyngeal Swab     Status: None   Collection Time: 10/22/20 11:50 AM   Specimen: Nasopharyngeal Swab; Nasopharyngeal(NP) swabs in vial transport medium  Result Value Ref Range Status   SARS Coronavirus 2 by RT PCR NEGATIVE NEGATIVE Final    Comment: (NOTE) SARS-CoV-2 target nucleic acids are  NOT DETECTED.  The SARS-CoV-2 RNA is generally detectable in upper respiratory specimens during the acute phase of infection. The lowest concentration of SARS-CoV-2 viral copies this assay can detect is 138 copies/mL. A negative result does not preclude SARS-Cov-2 infection and should not be used as the sole basis for treatment or other patient management decisions. A negative result may occur with  improper specimen collection/handling, submission of specimen other than nasopharyngeal swab, presence of viral mutation(s) within the areas targeted by this assay, and inadequate number of viral copies(<138 copies/mL). A negative result must be combined with clinical observations, patient history, and epidemiological information. The expected result is Negative.  Fact Sheet for Patients:  EntrepreneurPulse.com.au  Fact Sheet for Healthcare Providers:  IncredibleEmployment.be  This test is no t yet approved or cleared by the [place] FDA and  has been authorized for detection and/or diagnosis of SARS-CoV-2 by FDA under an Emergency Use Authorization (EUA). This EUA  will remain  in effect (meaning this test can be used) for the duration of the COVID-19 declaration under Section 564(b)(1) of the Act, 21 U.S.C.section 360bbb-3(b)(1), unless the authorization is terminated  or revoked sooner.       Influenza A by PCR NEGATIVE NEGATIVE Final   Influenza B by PCR NEGATIVE NEGATIVE Final    Comment: (NOTE) The Xpert Xpress SARS-CoV-2/FLU/RSV plus assay is intended as an aid in the diagnosis of influenza from Nasopharyngeal swab specimens and should not be used as a sole basis for treatment. Nasal washings and aspirates are unacceptable for Xpert Xpress SARS-CoV-2/FLU/RSV testing.  Fact Sheet for Patients: EntrepreneurPulse.com.au  Fact Sheet for Healthcare Providers: IncredibleEmployment.be  This test is not  yet approved or cleared by the [place] FDA and has been authorized for detection and/or diagnosis of SARS-CoV-2 by FDA under an Emergency Use Authorization (EUA). This EUA will remain in effect (meaning this test can be used) for the duration of the COVID-19 declaration under Section 564(b)(1) of the Act, 21 U.S.C. section 360bbb-3(b)(1), unless the authorization is terminated or revoked.  Performed at [facility], [place] [address] [place], [place] [address]   Blood culture (routine x 2)     Status: None (Preliminary result)   Collection Time: 10/22/20  5:12 PM   Specimen: BLOOD  Result Value Ref Range Status   Specimen Description BLOOD SITE NOT SPECIFIED  Final   Special Requests   Final    BOTTLES DRAWN AEROBIC AND ANAEROBIC Blood Culture results may not be optimal due to an inadequate volume of blood received in culture bottles   Culture   Final    NO GROWTH < 24 HOURS Performed at [facility], [place] [address] [place], [place] [address]    Report Status PENDING  Incomplete  Blood culture (routine x 2)     Status: None (Preliminary result)   Collection Time: 10/22/20  5:23 PM   Specimen: BLOOD  Result Value Ref Range Status   Specimen Description BLOOD SITE NOT SPECIFIED  Final   Special Requests   Final    BOTTLES DRAWN AEROBIC AND ANAEROBIC Blood Culture results may not be optimal due to an inadequate volume of blood received in culture bottles   Culture   Final    NO GROWTH < 24 HOURS Performed at [facility], [place] [address] [place], [place] [address]    Report Status PENDING  Incomplete         Radiology Studies: DG Chest 2 View  Result Date: 10/22/2020 CLINICAL DATA:  Shortness of breath and chest tightness. Status post treatment for pneumonia. EXAM: CHEST - 2 VIEW COMPARISON:  02/27/2020 FINDINGS: Stable cardiomediastinal contours. Aortic atherosclerotic calcifications. There is a small to moderate right pleural effusion. Lungs are  hyperinflated and there are coarsened interstitial markings compatible with emphysema. Bones appear diffusely osteopenic. Multi level compression fractures are identified throughout the thoracic spine, similar to previous exam. IMPRESSION: 1. Small to moderate right pleural effusion. 2. Emphysema. Electronically Signed   By: Kerby Moors M.D.   On: 10/22/2020 12:30   IR US CHEST  Result Date: 10/24/2020 CLINICAL DATA:  87 year old female with CHF, presented with shortness of breath. CT chest with a small volume right pleural effusion, the IR consulted for possible thoracentesis EXAM: LIMITED ULTRASOUND OF THE CHEST TECHNIQUE: A limited preprocedure ultrasound was performed of the right chest, prior to anticipated thoracentesis procedure. COMPARISON:  CT chest, 07/29/2020.  Chest radiograph, 09/22/2020.  FINDINGS: Small volume right pleural effusion. No significant fluid collection, or safe trajectory found. The procedure was aborted. IMPRESSION: 1. Small volume right pleural effusion. 2. Thoracentesis procedure was not performed. The patient's providers were informed of the findings. Electronically Signed   By: Michaelle Birks MD   On: 10/24/2020 07:24        Scheduled Meds:  apixaban  2.5 mg Oral BID   azithromycin  500 mg Oral Daily   diltiazem  180 mg Oral Daily   levothyroxine  137 mcg Oral QAC breakfast   magnesium oxide  400 mg Oral Daily   methylPREDNISolone (SOLU-MEDROL) injection  60 mg Intravenous Q12H   mometasone-formoterol  2 puff Inhalation BID   polyethylene glycol  17 g Oral Daily   umeclidinium bromide  1 puff Inhalation Daily   Continuous Infusions:  cefTRIAXone (ROCEPHIN)  IV 2 g (10/23/20 2204)          Aline August, MD [facility] 10/24/2020, 11:43 AM

## 2020-10-24 NOTE — Progress Notes (Signed)
PT Cancellation Note  Patient Details Name: Heather [name] MRN: [id number] DOB: 12/30/1926   Cancelled Treatment:    Reason Eval/Treat Not Completed: Other (comment).  Pt is declining PT today, had her meal and then had a reaction to IV meds and wanted to hold the visit.  Follow her up tomorrow as pt can tolerate.   Ramond Dial 10/24/2020, 3:33 PM  Mee Hives, PT MS Acute Rehab Dept. Number: [place] [facility] [place]

## 2020-10-24 NOTE — Evaluation (Signed)
Occupational Therapy Evaluation Patient Details Name: Heather [name] MRN: [id number] DOB: 27-Oct-1926 Today's Date: 10/24/2020    History of Present Illness Pt is a 87 y/o female admitted 7/25 secondary to SOB and weakness. Found to have PNA. PMH includes COPD on 2-3L, a fib, dCHF, and HTN.   Clinical Impression   PTA patient was living with her daughter in a private residence and was requiring some assist with ADLs/IADLs with use of AD. Patient currently functioning below baseline demonstrating observed ADLs including LB dressing and simulated toilet transfers with Min A to [place] guard. DOE 2/4 with light activity. Patient also limited by deficits listed below including decreased cardiopulmonary endurance, decreased activity tolerance and generalized weakness and would benefit from continued acute OT services in prep for safe d/c home with 24hr supervision/assist.     Follow Up Recommendations  Home health OT;Supervision/Assistance - 24 hour    Equipment Recommendations  None recommended by OT (Patient has necessary DME.)    Recommendations for Other Services       Precautions / Restrictions Precautions Precautions: Fall Restrictions Weight Bearing Restrictions: No      Mobility Bed Mobility Overal bed mobility: Needs Assistance Bed Mobility: Supine to Sit;Sit to Supine     Supine to sit: Supervision     General bed mobility comments: Supervision A with HOB elevated; +rail.    Transfers Overall transfer level: Needs assistance Equipment used: Rolling walker (2 wheeled) Transfers: Sit to/from Stand Sit to Stand: Min guard         General transfer comment: Min guard to supervision A for sit to stand from EOB positioned in lowest setting and from low recliner to RW.    Balance Overall balance assessment: Needs assistance Sitting-balance support: No upper extremity supported;Feet supported Sitting balance-Leahy Scale: Good     Standing balance support: Single  extremity supported Standing balance-Leahy Scale: Fair Standing balance comment: Able to maintain static standing balance without UE support while donning LB clothing. Reliant on BUE support for dynamic balance.                           ADL either performed or assessed with clinical judgement   ADL Overall ADL's : Needs assistance/impaired     Grooming: Min guard;Standing Grooming Details (indicate cue type and reason): Hand washing standing at sink level with Min guard and use of RW.             Lower Body Dressing: Minimal assistance;Sit to/from stand Lower Body Dressing Details (indicate cue type and reason): Min A to don LB dressing in sitting/standing with use of RW. Toilet Transfer: Designer, television/film set Details (indicate cue type and reason): Simulated with transfer to recliner. Good recall of hand placement.         Functional mobility during ADLs: Min guard;Rolling walker General ADL Comments: Patient limited by decreased cardiopulmonary status, generalized weakness and decreased activity tolernace.     Vision Baseline Vision/History: Wears glasses Wears Glasses: At all times Patient Visual Report: No change from baseline       Perception     Praxis      Pertinent Vitals/Pain Pain Assessment: No/denies pain     Hand Dominance     Extremity/Trunk Assessment Upper Extremity Assessment Upper Extremity Assessment: Generalized weakness   Lower Extremity Assessment Lower Extremity Assessment: Generalized weakness   Cervical / Trunk Assessment Cervical / Trunk Assessment: Kyphotic   Communication Communication Communication: [facility]  Cognition Arousal/Alertness: Awake/alert Behavior During Therapy: WFL for tasks assessed/performed Overall Cognitive Status: Within Functional Limits for tasks assessed                                 General Comments: A&Ox4; able to confirm home set-up and PLOF present in med chart.    General Comments  SpO2 100% on 10L O2 via [place] upon entry. Patient titrated down to 5L with SpO2 remaining 100%. Patient then titrated down to 3L (baseline O2) with SpO2 remaining 97-98% with short-distance mobility in room. RN made aware. DOE 2/4 with mobility and ADLs.    Exercises     Shoulder Instructions      Home Living Family/patient expects to be discharged to:: Private residence Living Arrangements: Children Available Help at Discharge: Family;Available 24 hours/day Type of Home: House Home Access: Ramped entrance     Home Layout: One level     Bathroom Shower/Tub: Teacher, early years/pre: Handicapped height     Home Equipment: Grab bars - toilet;Grab bars - tub/shower;Walker - 2 wheels;Cane - single point;Walker - 4 wheels;Shower seat;Transport chair;Other (comment);Hand held shower head (oxygen)   Additional Comments: On 3L at home      Prior Functioning/Environment Level of Independence: Needs assistance  Gait / Transfers Assistance Needed: Mod I to supervision A for household mobility and ADL transfers ADL's / Homemaking Assistance Needed: Requires some assist with ADLs/IADLs from daughter            OT Problem List: Decreased strength;Decreased activity tolerance;Impaired balance (sitting and/or standing);Cardiopulmonary status limiting activity      OT Treatment/Interventions: Self-care/ADL training;Therapeutic exercise;Energy conservation;DME and/or AE instruction;Therapeutic activities;Patient/family education;Balance training    OT Goals(Current goals can be found in the care plan section) Acute Rehab OT Goals Patient Stated Goal: To be able to participate in IADLs including light housekeeping and meal prep\ OT Goal Formulation: With patient Time For Goal Achievement: 11/07/20 Potential to Achieve Goals: Good ADL Goals Additional ADL Goal #1: Patient will complete ADLs with supervision A to Mod I with use of AD and SpO2 >90% on 3L O2 via  [place]. Additional ADL Goal #2: Patient will recall 3 energy conservation techniques in prep for ADLs. Additional ADL Goal #3: Patient will recall 3 strategies to reduce risk of falls in prep for safe return home.  OT Frequency: Min 2X/week   Barriers to D/C:            Co-evaluation              AM-PAC OT "6 Clicks" Daily Activity     Outcome Measure Help from another person eating meals?: None Help from another person taking care of personal grooming?: A Little Help from another person toileting, which includes using toliet, bedpan, or urinal?: A Little Help from another person bathing (including washing, rinsing, drying)?: A Little Help from another person to put on and taking off regular upper body clothing?: A Little Help from another person to put on and taking off regular lower body clothing?: A Little 6 Click Score: 19   End of Session Equipment Utilized During Treatment: Gait belt;Rolling walker Nurse Communication: Mobility status;Other (comment) (Titrated O2 from 10L to 3L.)  Activity Tolerance: Patient tolerated treatment well;Patient limited by fatigue Patient left: in chair;with call bell/phone within reach;with chair alarm set  OT Visit Diagnosis: Unsteadiness on feet (R26.81);Muscle weakness (generalized) (M62.81)  Time: 4540-9811 OT Time Calculation (min): 27 min Charges:  OT General Charges $OT Visit: 1 Visit OT Evaluation $OT Eval Moderate Complexity: 1 Mod OT Treatments $Self Care/Home Management : 8-22 mins  Nicholl Onstott H. OTR/L Supplemental OT, Department of rehab services [phone]  Shiya Fogelman R H. 10/24/2020, 8:22 AM

## 2020-10-24 NOTE — Plan of Care (Signed)

## 2020-10-25 DIAGNOSIS — J189 Pneumonia, unspecified organism: Secondary | ICD-10-CM | POA: Diagnosis not present

## 2020-10-25 DIAGNOSIS — J9611 Chronic respiratory failure with hypoxia: Secondary | ICD-10-CM | POA: Diagnosis not present

## 2020-10-25 DIAGNOSIS — J441 Chronic obstructive pulmonary disease with (acute) exacerbation: Secondary | ICD-10-CM | POA: Diagnosis not present

## 2020-10-25 DIAGNOSIS — J90 Pleural effusion, not elsewhere classified: Secondary | ICD-10-CM | POA: Diagnosis not present

## 2020-10-25 LAB — BASIC METABOLIC PANEL
Anion gap: 8 (ref 5–15)
BUN: 16 mg/dL (ref 8–23)
CO2: 33 mmol/L — ABNORMAL HIGH (ref 22–32)
Calcium: 8.4 mg/dL — ABNORMAL LOW (ref 8.9–10.3)
Chloride: 96 mmol/L — ABNORMAL LOW (ref 98–111)
Creatinine, Ser: 0.64 mg/dL (ref 0.44–1.00)
GFR, Estimated: >60 mL/min (ref >60)
Glucose, Bld: 258 mg/dL — ABNORMAL HIGH (ref 70–99)
Potassium: 2.9 mmol/L — ABNORMAL LOW (ref 3.5–5.1)
Sodium: 137 mmol/L (ref 135–145)

## 2020-10-25 LAB — PROCALCITONIN: Procalcitonin: 54.66 ng/mL

## 2020-10-25 LAB — MAGNESIUM: Magnesium: 2.2 mg/dL (ref 1.7–2.4)

## 2020-10-25 MED ORDER — FUROSEMIDE 10 MG/ML IJ SOLN
40.0000 mg | Freq: Once | INTRAMUSCULAR | Status: AC
  Administered 2020-10-25: 40 mg via INTRAVENOUS
  Filled 2020-10-25: qty 4

## 2020-10-25 MED ORDER — POTASSIUM CHLORIDE CRYS ER 20 MEQ PO TBCR
40.0000 meq | EXTENDED_RELEASE_TABLET | ORAL | Status: AC
  Administered 2020-10-25 (×2): 40 meq via ORAL
  Filled 2020-10-25 (×2): qty 2

## 2020-10-25 NOTE — Progress Notes (Signed)
Occupational Therapy Treatment Patient Details Name: Heather [name] MRN: [id number] DOB: December 10, 1926 Today's Date: 10/25/2020    History of present illness Pt is a 87 y/o female admitted 7/25 secondary to SOB and weakness. Found to have PNA. PMH includes COPD on 2-3L, a fib, dCHF, and HTN.   OT comments  Patient increased to 4L with activity during OT session due to desat on 3L to 83% standing at sink. Patient between 88-95% with cues for pursed lip breathing and increased rest breaks with ADL tasks on 4L. Patient min G for functional transfers and up to mod A for lower body ADLs due to fatigue/limited activity tolerance. Encourage patient to try and utilize bedside commode during the day to maximize activity level and use purewick at night time. Able to wean patient back to 3L once in recliner chair maintaining O2 in mid 90s.  Acute OT to follow.  Follow Up Recommendations  Home health OT;Supervision/Assistance - 24 hour    Equipment Recommendations  None recommended by OT       Precautions / Restrictions Precautions Precautions: Fall Precaution Comments: monitor O2       Mobility Bed Mobility               General bed mobility comments: seated EOB upon arrival    Transfers Overall transfer level: Needs assistance Equipment used: Rolling walker (2 wheeled) Transfers: Sit to/from Stand Sit to Stand: Min guard         General transfer comment: for safety due to limited activity tolerance    Balance Overall balance assessment: Needs assistance Sitting-balance support: No upper extremity supported;Feet supported Sitting balance-Leahy Scale: Good     Standing balance support: Single extremity supported;Bilateral upper extremity supported Standing balance-Leahy Scale: Fair Standing balance comment: demonstrate static stand without UE support                           ADL either performed or assessed with clinical judgement   ADL Overall ADL's : Needs  assistance/impaired     Grooming: Oral care;Wash/dry face;Wash/dry hands;Supervision/safety;Standing Grooming Details (indicate cue type and reason): unilateral upper extremity support on sink during oral care             Lower Body Dressing: Set up;Sitting/lateral leans Lower Body Dressing Details (indicate cue type and reason): with increased time patient able to doff soiled socks and don clean pair seated on commode, increased work of breathing/exertion [employer] forces technical officer: Min guard;Ambulation;RW;BSC Toilet Transfer Details (indicate cue type and reason): min guard for safety as patient with limited activity tolerance Toileting- Clothing Manipulation and Hygiene: Moderate assistance;Sitting/lateral lean;Sit to/from stand Toileting - Clothing Manipulation Details (indicate cue type and reason): patient able to perform peri care in standing however needing assist with changing into clean mesh underwear due to fatigue from leaning over to change socks. limited cardiopulonary status     Functional mobility during ADLs: Min guard;Rolling walker General ADL Comments: increased patient O2 to 4L with activity with readings between 88-95% with activity. cues for pursed lip breathing, able to wean back to 3L once in recliner c hair      Cognition Arousal/Alertness: Awake/alert Behavior During Therapy: WFL for tasks assessed/performed Overall Cognitive Status: Within Functional Limits for tasks assessed  Pertinent Vitals/ Pain       Pain Assessment: No/denies pain         Frequency  Min 2X/week        Progress Toward Goals  OT Goals(current goals can now be found in the care plan section)  Progress towards OT goals: Progressing toward goals  Acute Rehab OT Goals Patient Stated Goal: To be able to participate in IADLs including light housekeeping and meal prep\ OT Goal Formulation: With patient Time For  Goal Achievement: 11/07/20 Potential to Achieve Goals: Good ADL Goals Additional ADL Goal #1: Patient will complete ADLs with supervision A to Mod I with use of AD and SpO2 >90% on 3L O2 via [place]. Additional ADL Goal #2: Patient will recall 3 energy conservation techniques in prep for ADLs. Additional ADL Goal #3: Patient will recall 3 strategies to reduce risk of falls in prep for safe return home.  Plan Discharge plan remains appropriate       AM-PAC OT "6 Clicks" Daily Activity     Outcome Measure   Help from another person eating meals?: None Help from another person taking care of personal grooming?: A Little Help from another person toileting, which includes using toliet, bedpan, or urinal?: A Lot Help from another person bathing (including washing, rinsing, drying)?: A Little Help from another person to put on and taking off regular upper body clothing?: A Little Help from another person to put on and taking off regular lower body clothing?: A Little 6 Click Score: 18    End of Session Equipment Utilized During Treatment: Oxygen;Rolling walker  OT Visit Diagnosis: Unsteadiness on feet (R26.81);Muscle weakness (generalized) (M62.81)   Activity Tolerance Patient tolerated treatment well   Patient Left in chair;with call bell/phone within reach;with chair alarm set   Nurse Communication Mobility status        Time: 3557-3220 OT Time Calculation (min): 30 min  Charges: OT General Charges $OT Visit: 1 Visit OT Treatments $Self Care/Home Management : 23-37 mins  Delbert Phenix OT OT pager: [place] 10/25/2020, 9:12 AM

## 2020-10-25 NOTE — Plan of Care (Signed)
  Problem: Activity: Goal: Risk for activity intolerance will decrease Outcome: Progressing   Problem: Nutrition: Goal: Adequate nutrition will be maintained Outcome: Progressing   Problem: Coping: Goal: Level of anxiety will decrease Outcome: Progressing   Problem: Elimination: Goal: Will not experience complications related to bowel motility Outcome: Progressing   

## 2020-10-25 NOTE — Progress Notes (Signed)
Physical Therapy Treatment Patient Details Name: Heather [name] MRN: [id number] DOB: 01-26-1927 Today's Date: 10/25/2020    History of Present Illness Pt is a 87 y/o female admitted 7/25 secondary to SOB and weakness. Found to have PNA. PMH includes COPD on 2-3L, a fib, dCHF, and HTN.    PT Comments    Patient up earlier in chair after OT session and had been back to bed x 1 hour. Agreeable to get up to ambulate "it always makes me feel better." Overall moving well with min cues needed for pursed lip breathing and to limit talking when exerting herself to help her maintain her oxygen saturation. On 3L with lowest sat 90% while walking. Pt making good progress toward PT goals.     Follow Up Recommendations  Home health PT;Supervision/Assistance - 24 hour     Equipment Recommendations  None recommended by PT    Recommendations for Other Services       Precautions / Restrictions Precautions Precautions: Fall Precaution Comments: monitor O2    Mobility  Bed Mobility Overal bed mobility: Modified Independent Bed Mobility: Supine to Sit     Supine to sit: Modified independent (Device/Increase time)     General bed mobility comments: remained seated at EOB to eat her lunch    Transfers Overall transfer level: Needs assistance Equipment used: Rolling walker (2 wheeled) Transfers: Sit to/from Stand Sit to Stand: Min guard         General transfer comment: for safety due to limited activity tolerance  Ambulation/Gait Ambulation/Gait assistance: Min guard Gait Distance (Feet): 60 Feet Assistive device: Rolling walker (2 wheeled) Gait Pattern/deviations: Step-through pattern;Trunk flexed     General Gait Details: initial goal to walk to door, pt feeling well enough to don mask and walk into hallway; sats at lowest 90% on 3L   Stairs             Wheelchair Mobility    Modified Rankin (Stroke Patients Only)       Balance Overall balance assessment:  Needs assistance Sitting-balance support: No upper extremity supported;Feet supported Sitting balance-Leahy Scale: Good     Standing balance support: Single extremity supported;Bilateral upper extremity supported Standing balance-Leahy Scale: Fair Standing balance comment: demonstrate static stand without UE support                            Cognition Arousal/Alertness: Awake/alert Behavior During Therapy: WFL for tasks assessed/performed Overall Cognitive Status: Within Functional Limits for tasks assessed                                        Exercises      General Comments General comments (skin integrity, edema, etc.): on 3L with sats 100% at rest and lowest 90% with walking. Min cues for pursed lip breathing and to limit talking when exerting herself/performing activity.      Pertinent Vitals/Pain Pain Assessment: No/denies pain    Home Living                      Prior Function            PT Goals (current goals can now be found in the care plan section) Acute Rehab PT Goals Patient Stated Goal: To be able to participate in IADLs including light housekeeping and meal prep\ Time For Goal Achievement:  11/06/20 Potential to Achieve Goals: Good Progress towards PT goals: Progressing toward goals    Frequency    Min 3X/week      PT Plan Current plan remains appropriate    Co-evaluation              AM-PAC PT "6 Clicks" Mobility   Outcome Measure  Help needed turning from your back to your side while in a flat bed without using bedrails?: None Help needed moving from lying on your back to sitting on the side of a flat bed without using bedrails?: None Help needed moving to and from a bed to a chair (including a wheelchair)?: A Little Help needed standing up from a chair using your arms (e.g., wheelchair or bedside chair)?: A Little Help needed to walk in hospital room?: A Little Help needed climbing 3-5 steps with  a railing? : A Little 6 Click Score: 20    End of Session Equipment Utilized During Treatment: Gait belt;Oxygen Activity Tolerance: Patient limited by fatigue Patient left: in bed;with call bell/phone within reach;with bed alarm set Nurse Communication: Mobility status;Other (comment) (sitting EOB with her lunch) PT Visit Diagnosis: Unsteadiness on feet (R26.81);Muscle weakness (generalized) (M62.81)     Time: 9977-4142 PT Time Calculation (min) (ACUTE ONLY): 15 min  Charges:  $Gait Training: 8-22 mins                      Arby Barrette, PT Pager [phone]    Rexanne Mano 10/25/2020, 12:14 PM

## 2020-10-25 NOTE — Progress Notes (Signed)
Patient ID: Heather [name], female   DOB: [birth year], 87 y.o.   MRN: [id number]  PROGRESS NOTE    Heather [name]  SPQ:[id number] DOB: 07-08-1926 DOA: 10/22/2020 PCP: Burnard Bunting, MD   Brief Narrative:  87 y.o. female with medical history significant of COPD, paroxysmal A. Fib, chronic hypoxic respiratory failure on 2 L of oxygen per minute at home,diastolic congestive heart failure, pulmonary nodule, abdominal aortic aneurysm, hypothyroidism, hypertension presented to the emergency department  with complaints of increased weakness, fatigue, shortness of breath and was diagnosed with community-acquired pneumonia along with right pleural effusion and hypoxia.  Assessment & Plan:   Acute on chronic hypoxic respiratory failure Community-acquired pneumonia Possible COPD exacerbation -Normally uses 2 L oxygen via nasal cannula at home.  Currently on 3 L oxygen via nasal cannula and struggling to breathe.  Wean off as able. -Continue Rocephin and Zithromax.  Cultures negative so far.  COVID and influenza negative on admission -Continue IV Solu-Medrol.  Continue Dulera and nebs.  Acute on chronic diastolic CHF -EF 33% on most recent echocardiogram.  Strict input and output.  Daily weights.  Patient has received IV Lasix over the last 2 days.  Might have to give 1 more dose today.  Hypokalemia -Replace.  Repeat a.m. labs.  Mild right sided pleural effusion -Not enough to be tapped.  Failed attempt by IR.  Continue diuresis  Hypertension -continue Cardizem  Paroxysmal A. Fib -Currently rate controlled.  Continue Cardizem.  Continue Eliquis  Leukocytosis -Resolved  Hypothyroidism -Continue Synthroid  Generalized deconditioning -PT recommends home health PT/OT  History of abdominal aortic aneurysm -Outpatient PCP follow-up and is appropriate follow-up.   DVT prophylaxis: Eliquis Code Status: DNR Family Communication: None at bedside Disposition Plan: Status is:  Inpatient  Remains inpatient appropriate because:Inpatient level of care appropriate due to severity of illness  Dispo: The patient is from: Home              Anticipated d/c is to: Home in 1 to 2 days if patient improved symptomatically.              Patient currently is not medically stable to d/c.   Difficult to place patient No  Consultants: None  Procedures: None  Antimicrobials: Rocephin and Zithromax   Subjective: Patient seen and examined at bedside.  Feels slightly better but still short of breath with even minimal exertion.  Denies any overnight fever or vomiting.  No chest pain. Doesn't feel well enough to go home today. Objective: Vitals:   10/24/20 2014 10/25/20 0200 10/25/20 0300 10/25/20 0500  BP: 127/69  122/71   Pulse: 93 96 92   Resp: 20 20 20    Temp: 98.6 F (37 C)  98 F (36.7 C)   TempSrc: Oral     SpO2: 100% 100% 100%   Weight:    44.3 kg    Intake/Output Summary (Last 24 hours) at 10/25/2020 0802 Last data filed at 10/25/2020 0753 Gross per 24 hour  Intake --  Output 800 ml  Net -800 ml    Filed Weights   10/23/20 1915 10/25/20 0500  Weight: 44.7 kg 44.3 kg    Examination:  General exam: No acute distress.  Elderly female sitting on chair.  On 3 L oxygen via nasal cannula  respiratory system: Decreased breath sounds at bases bilaterally with some crackles Cardiovascular system: Rate controlled, S1-S2 heard Gastrointestinal system: Abdomen is distended, soft and nontender.  Bowel sounds are heard  extremities: No lower extremity  edema present; no clubbing.   Central nervous system: Awake and alert. No focal neurological deficits.  Moves extremities  skin: No obvious ecchymosis/lesions Psychiatry: Affect is flat mostly; intermittently gets anxious    Data Reviewed: I have personally reviewed following labs and imaging studies  CBC: Recent Labs  Lab 10/22/20 1150 10/23/20 0335  WBC 11.1* 10.6*  NEUTROABS 8.1*  --   HGB 15.3* 14.3   HCT 45.2 45.0  MCV 96.2 100.9*  PLT 330 878    Basic Metabolic Panel: Recent Labs  Lab 10/22/20 1150 10/22/20 1710 10/23/20 0335 10/25/20 0131  NA 139  --  141 137  K 2.8*  --  2.8* 2.9*  CL 94*  --  101 96*  CO2 35*  --  30 33*  GLUCOSE 123*  --  104* 258*  BUN 11  --  8 16  CREATININE 0.82  --  0.68 0.64  CALCIUM 9.3  --  8.6* 8.4*  MG  --  2.2  --  2.2    GFR: Estimated Creatinine Clearance: 32 mL/min (by C-G formula based on SCr of 0.64 mg/dL). Liver Function Tests: Recent Labs  Lab 10/22/20 1150  AST 27  ALT 18  ALKPHOS 97  BILITOT 0.9  PROT 5.9*  ALBUMIN 3.7    No results for input(s): LIPASE, AMYLASE in the last 168 hours. No results for input(s): AMMONIA in the last 168 hours. Coagulation Profile: No results for input(s): INR, PROTIME in the last 168 hours. Cardiac Enzymes: No results for input(s): CKTOTAL, CKMB, CKMBINDEX, TROPONINI in the last 168 hours. BNP (last 3 results) No results for input(s): PROBNP in the last 8760 hours. HbA1C: No results for input(s): HGBA1C in the last 72 hours. CBG: No results for input(s): GLUCAP in the last 168 hours. Lipid Profile: No results for input(s): CHOL, HDL, LDLCALC, TRIG, CHOLHDL, LDLDIRECT in the last 72 hours. Thyroid Function Tests: No results for input(s): TSH, T4TOTAL, FREET4, T3FREE, THYROIDAB in the last 72 hours. Anemia Panel: No results for input(s): VITAMINB12, FOLATE, FERRITIN, TIBC, IRON, RETICCTPCT in the last 72 hours. Sepsis Labs: Recent Labs  Lab 10/22/20 1151 10/23/20 1326 10/24/20 0125 10/25/20 0131  PROCALCITON  --  45.11 51.51 54.66  LATICACIDVEN 2.0*  --   --   --      Recent Results (from the past 240 hour(s))  Resp Panel by RT-PCR (Flu A&B, Covid) Nasopharyngeal Swab     Status: None   Collection Time: 10/22/20 11:50 AM   Specimen: Nasopharyngeal Swab; Nasopharyngeal(NP) swabs in vial transport medium  Result Value Ref Range Status   SARS Coronavirus 2 by RT PCR  NEGATIVE NEGATIVE Final    Comment: (NOTE) SARS-CoV-2 target nucleic acids are NOT DETECTED.  The SARS-CoV-2 RNA is generally detectable in upper respiratory specimens during the acute phase of infection. The lowest concentration of SARS-CoV-2 viral copies this assay can detect is 138 copies/mL. A negative result does not preclude SARS-Cov-2 infection and should not be used as the sole basis for treatment or other patient management decisions. A negative result may occur with  improper specimen collection/handling, submission of specimen other than nasopharyngeal swab, presence of viral mutation(s) within the areas targeted by this assay, and inadequate number of viral copies(<138 copies/mL). A negative result must be combined with clinical observations, patient history, and epidemiological information. The expected result is Negative.  Fact Sheet for Patients:  EntrepreneurPulse.com.au  Fact Sheet for Healthcare Providers:  IncredibleEmployment.be  This test is no t yet approved  or cleared by the [place] and  has been authorized for detection and/or diagnosis of SARS-CoV-2 by FDA under an Emergency Use Authorization (EUA). This EUA will remain  in effect (meaning this test can be used) for the duration of the COVID-19 declaration under Section 564(b)(1) of the Act, 21 U.S.C.section 360bbb-3(b)(1), unless the authorization is terminated  or revoked sooner.       Influenza A by PCR NEGATIVE NEGATIVE Final   Influenza B by PCR NEGATIVE NEGATIVE Final    Comment: (NOTE) The Xpert Xpress SARS-CoV-2/FLU/RSV plus assay is intended as an aid in the diagnosis of influenza from Nasopharyngeal swab specimens and should not be used as a sole basis for treatment. Nasal washings and aspirates are unacceptable for Xpert Xpress SARS-CoV-2/FLU/RSV testing.  Fact Sheet for Patients: EntrepreneurPulse.com.au  Fact Sheet for  Healthcare Providers: IncredibleEmployment.be  This test is not yet approved or cleared by the [place] FDA and has been authorized for detection and/or diagnosis of SARS-CoV-2 by FDA under an Emergency Use Authorization (EUA). This EUA will remain in effect (meaning this test can be used) for the duration of the COVID-19 declaration under Section 564(b)(1) of the Act, 21 U.S.C. section 360bbb-3(b)(1), unless the authorization is terminated or revoked.  Performed at [facility], [place] [address], [place], [place] [address]   Blood culture (routine x 2)     Status: None (Preliminary result)   Collection Time: 10/22/20  5:12 PM   Specimen: BLOOD  Result Value Ref Range Status   Specimen Description BLOOD SITE NOT SPECIFIED  Final   Special Requests   Final    BOTTLES DRAWN AEROBIC AND ANAEROBIC Blood Culture results may not be optimal due to an inadequate volume of blood received in culture bottles   Culture   Final    NO GROWTH 2 DAYS Performed at [facility], [place] [address], [place], [place] [address]    Report Status PENDING  Incomplete  Blood culture (routine x 2)     Status: None (Preliminary result)   Collection Time: 10/22/20  5:23 PM   Specimen: BLOOD  Result Value Ref Range Status   Specimen Description BLOOD SITE NOT SPECIFIED  Final   Special Requests   Final    BOTTLES DRAWN AEROBIC AND ANAEROBIC Blood Culture results may not be optimal due to an inadequate volume of blood received in culture bottles   Culture   Final    NO GROWTH 2 DAYS Performed at [facility], [place] [address] [place], [place] [address]    Report Status PENDING  Incomplete          Radiology Studies: IR US CHEST  Result Date: 10/24/2020 CLINICAL DATA:  87 year old female with CHF, presented with shortness of breath. CT chest with a small volume right pleural effusion, the IR consulted for possible thoracentesis EXAM: LIMITED ULTRASOUND OF THE CHEST  TECHNIQUE: A limited preprocedure ultrasound was performed of the right chest, prior to anticipated thoracentesis procedure. COMPARISON:  CT chest, 07/29/2020.  Chest radiograph, 09/22/2020. FINDINGS: Small volume right pleural effusion. No significant fluid collection, or safe trajectory found. The procedure was aborted. IMPRESSION: 1. Small volume right pleural effusion. 2. Thoracentesis procedure was not performed. The patient's providers were informed of the findings. Electronically Signed   By: Michaelle Birks MD   On: 10/24/2020 07:24        Scheduled Meds:  apixaban  2.5 mg Oral BID   azithromycin  500 mg  Oral Daily   diltiazem  180 mg Oral Daily   levothyroxine  137 mcg Oral QAC breakfast   magnesium oxide  400 mg Oral Daily   methylPREDNISolone (SOLU-MEDROL) injection  60 mg Intravenous Q12H   mometasone-formoterol  2 puff Inhalation BID   polyethylene glycol  17 g Oral Daily   umeclidinium bromide  1 puff Inhalation Daily   Continuous Infusions:  cefTRIAXone (ROCEPHIN)  IV 2 g (10/24/20 1808)          Aline August, MD [facility] 10/25/2020, 8:02 AM

## 2020-10-26 DIAGNOSIS — J189 Pneumonia, unspecified organism: Secondary | ICD-10-CM | POA: Diagnosis not present

## 2020-10-26 LAB — BASIC METABOLIC PANEL
Anion gap: 7 (ref 5–15)
BUN: 20 mg/dL (ref 8–23)
CO2: 32 mmol/L (ref 22–32)
Calcium: 8.9 mg/dL (ref 8.9–10.3)
Chloride: 94 mmol/L — ABNORMAL LOW (ref 98–111)
Creatinine, Ser: 0.63 mg/dL (ref 0.44–1.00)
GFR, Estimated: >60 mL/min (ref >60)
Glucose, Bld: 232 mg/dL — ABNORMAL HIGH (ref 70–99)
Potassium: 4.4 mmol/L (ref 3.5–5.1)
Sodium: 133 mmol/L — ABNORMAL LOW (ref 135–145)

## 2020-10-26 LAB — GLUCOSE, CAPILLARY
Glucose-Capillary: 338 mg/dL — ABNORMAL HIGH (ref 70–99)
Glucose-Capillary: 164 mg/dL — ABNORMAL HIGH (ref 70–99)

## 2020-10-26 LAB — MAGNESIUM: Magnesium: 2.2 mg/dL (ref 1.7–2.4)

## 2020-10-26 LAB — HEMOGLOBIN A1C
Hgb A1c MFr Bld: 5.5 % (ref 4.8–5.6)
Mean Plasma Glucose: 111.15 mg/dL

## 2020-10-26 MED ORDER — INSULIN ASPART 100 UNIT/ML IJ SOLN
0.0000 [IU] | Freq: Three times a day (TID) | INTRAMUSCULAR | Status: DC
  Administered 2020-10-26: 7 [IU] via SUBCUTANEOUS
  Administered 2020-10-27 (×2): 2 [IU] via SUBCUTANEOUS
  Administered 2020-10-27: 3 [IU] via SUBCUTANEOUS
  Administered 2020-10-28: 2 [IU] via SUBCUTANEOUS

## 2020-10-26 MED ORDER — METHYLPREDNISOLONE SODIUM SUCC 40 MG IJ SOLR
40.0000 mg | Freq: Two times a day (BID) | INTRAMUSCULAR | Status: DC
  Administered 2020-10-26 – 2020-10-28 (×5): 40 mg via INTRAVENOUS
  Filled 2020-10-26 (×5): qty 1

## 2020-10-26 MED ORDER — FUROSEMIDE 10 MG/ML IJ SOLN
40.0000 mg | Freq: Once | INTRAMUSCULAR | Status: AC
  Administered 2020-10-26: 40 mg via INTRAVENOUS
  Filled 2020-10-26: qty 4

## 2020-10-26 MED ORDER — SODIUM CHLORIDE 0.9 % IV SOLN
2.0000 g | INTRAVENOUS | Status: AC
  Administered 2020-10-26: 2 g via INTRAVENOUS
  Filled 2020-10-26: qty 20

## 2020-10-26 NOTE — Care Management Important Message (Signed)
Important Message  Patient Details  Name: Heather [name] MRN: [id number] Date of Birth: [birth year]   Medicare Important Message Given:  Yes     Memory Argue 10/26/2020, 4:58 PM

## 2020-10-26 NOTE — Progress Notes (Signed)
Patient ID: THEOLA CUELLAR, female   DOB: October 29, 1926, 87 y.o.   MRN: [id number]  PROGRESS NOTE    MAISEY DEANDRADE  NFA:[id number] DOB: 1926-07-11 DOA: 10/22/2020 PCP: Burnard Bunting, MD   Brief Narrative:  87 y.o. female with medical history significant of COPD, paroxysmal A. Fib, chronic hypoxic respiratory failure on 2 L of oxygen per minute at home,diastolic congestive heart failure, pulmonary nodule, abdominal aortic aneurysm, hypothyroidism, hypertension presented to the emergency department  with complaints of increased weakness, fatigue, shortness of breath and was diagnosed with community-acquired pneumonia along with right pleural effusion and hypoxia.  Assessment & Plan:   Acute on chronic hypoxic respiratory failure Community-acquired pneumonia Possible COPD exacerbation -Normally uses 2 L oxygen via nasal cannula at home.  Currently on 3 L oxygen via nasal cannula and struggling to breathe.  Wean off as able. -Continue Rocephin and Zithromax.  Cultures negative so far.  COVID and influenza negative on admission -Decrease Solu-Medrol to 40 mg IV every 12 hours.  Continue Dulera and nebs.  Acute on chronic diastolic CHF -EF 46% on most recent echocardiogram.  Strict input and output.  Daily weights.  Patient has received IV Lasix over the last 3 days.  We will give 1 more dose of Lasix today   Hypokalemia -Improved  Mild right sided pleural effusion -Not enough to be tapped.  Failed attempt by IR.  Continue diuresis  Hypertension -continue Cardizem  Paroxysmal A. Fib -Currently rate controlled.  Continue Cardizem.  Continue Eliquis  Leukocytosis -Resolved  Hypothyroidism -Continue Synthroid  Generalized deconditioning -PT recommends home health PT/OT  History of abdominal aortic aneurysm -Outpatient PCP follow-up and is appropriate follow-up.   DVT prophylaxis: Eliquis Code Status: DNR Family Communication: None at bedside Disposition Plan: Status is:  Inpatient  Remains inpatient appropriate because:Inpatient level of care appropriate due to severity of illness  Dispo: The patient is from: Home              Anticipated d/c is to: Home in 1 to 2 days if patient improved symptomatically.              Patient currently is not medically stable to d/c.   Difficult to place patient No  Consultants: None  Procedures: None  Antimicrobials: Rocephin and Zithromax   Subjective: Patient seen and examined at bedside.  Complains of some chest tightness and weakness.  Still does not feel ready to go home today.  No overnight fever or vomiting reported.  Still short of breath with minimal exertion.   Objective: Vitals:   10/26/20 0328 10/26/20 0500 10/26/20 0800 10/26/20 0828  BP: 120/71  128/86   Pulse: 81  82   Resp: (!) 9  18   Temp: 97.6 F (36.4 C)     TempSrc: Oral     SpO2: 96%  97% 100%  Weight:  44.8 kg      Intake/Output Summary (Last 24 hours) at 10/26/2020 1001 Last data filed at 10/26/2020 0800 Gross per 24 hour  Intake 1020 ml  Output 600 ml  Net 420 ml    Filed Weights   10/23/20 1915 10/25/20 0500 10/26/20 0500  Weight: 44.7 kg 44.3 kg 44.8 kg    Examination:  General exam: No distress.  Elderly female lying in bed.  Currently on 3 L oxygen via nasal cannula.   Respiratory system: Bilateral decreased breath sounds at bases with scattered crackles  cardiovascular system: S1-S2 heard, rate controlled  gastrointestinal system: Abdomen is mildly  distended, soft and nontender.  Normal bowel sounds heard extremities: No cyanosis; mild lower extremity edema present Central nervous system: Alert and oriented. No focal neurological deficits.  Moving extremities skin: No obvious petechiae/rashes  psychiatry: Slightly anxious intermittently.  Otherwise mostly flat affect    Data Reviewed: I have personally reviewed following labs and imaging studies  CBC: Recent Labs  Lab 10/22/20 1150 10/23/20 0335  WBC 11.1*  10.6*  NEUTROABS 8.1*  --   HGB 15.3* 14.3  HCT 45.2 45.0  MCV 96.2 100.9*  PLT 330 245    Basic Metabolic Panel: Recent Labs  Lab 10/22/20 1150 10/22/20 1710 10/23/20 0335 10/25/20 0131 10/26/20 0800  NA 139  --  141 137 133*  K 2.8*  --  2.8* 2.9* 4.4  CL 94*  --  101 96* 94*  CO2 35*  --  30 33* 32  GLUCOSE 123*  --  104* 258* 232*  BUN 11  --  8 16 20   CREATININE 0.82  --  0.68 0.64 0.63  CALCIUM 9.3  --  8.6* 8.4* 8.9  MG  --  2.2  --  2.2 2.2    GFR: Estimated Creatinine Clearance: 32.4 mL/min (by C-G formula based on SCr of 0.63 mg/dL). Liver Function Tests: Recent Labs  Lab 10/22/20 1150  AST 27  ALT 18  ALKPHOS 97  BILITOT 0.9  PROT 5.9*  ALBUMIN 3.7    No results for input(s): LIPASE, AMYLASE in the last 168 hours. No results for input(s): AMMONIA in the last 168 hours. Coagulation Profile: No results for input(s): INR, PROTIME in the last 168 hours. Cardiac Enzymes: No results for input(s): CKTOTAL, CKMB, CKMBINDEX, TROPONINI in the last 168 hours. BNP (last 3 results) No results for input(s): PROBNP in the last 8760 hours. HbA1C: No results for input(s): HGBA1C in the last 72 hours. CBG: No results for input(s): GLUCAP in the last 168 hours. Lipid Profile: No results for input(s): CHOL, HDL, LDLCALC, TRIG, CHOLHDL, LDLDIRECT in the last 72 hours. Thyroid Function Tests: No results for input(s): TSH, T4TOTAL, FREET4, T3FREE, THYROIDAB in the last 72 hours. Anemia Panel: No results for input(s): VITAMINB12, FOLATE, FERRITIN, TIBC, IRON, RETICCTPCT in the last 72 hours. Sepsis Labs: Recent Labs  Lab 10/22/20 1151 10/23/20 1326 10/24/20 0125 10/25/20 0131  PROCALCITON  --  45.11 51.51 54.66  LATICACIDVEN 2.0*  --   --   --      Recent Results (from the past 240 hour(s))  Resp Panel by RT-PCR (Flu A&B, Covid) Nasopharyngeal Swab     Status: None   Collection Time: 10/22/20 11:50 AM   Specimen: Nasopharyngeal Swab; Nasopharyngeal(NP)  swabs in vial transport medium  Result Value Ref Range Status   SARS Coronavirus 2 by RT PCR NEGATIVE NEGATIVE Final    Comment: (NOTE) SARS-CoV-2 target nucleic acids are NOT DETECTED.  The SARS-CoV-2 RNA is generally detectable in upper respiratory specimens during the acute phase of infection. The lowest concentration of SARS-CoV-2 viral copies this assay can detect is 138 copies/mL. A negative result does not preclude SARS-Cov-2 infection and should not be used as the sole basis for treatment or other patient management decisions. A negative result may occur with  improper specimen collection/handling, submission of specimen other than nasopharyngeal swab, presence of viral mutation(s) within the areas targeted by this assay, and inadequate number of viral copies(<138 copies/mL). A negative result must be combined with clinical observations, patient history, and epidemiological information. The expected result is Negative.  Fact Sheet for Patients:  EntrepreneurPulse.com.au  Fact Sheet for Healthcare Providers:  IncredibleEmployment.be  This test is no t yet approved or cleared by the [place] FDA and  has been authorized for detection and/or diagnosis of SARS-CoV-2 by FDA under an Emergency Use Authorization (EUA). This EUA will remain  in effect (meaning this test can be used) for the duration of the COVID-19 declaration under Section 564(b)(1) of the Act, 21 U.S.C.section 360bbb-3(b)(1), unless the authorization is terminated  or revoked sooner.       Influenza A by PCR NEGATIVE NEGATIVE Final   Influenza B by PCR NEGATIVE NEGATIVE Final    Comment: (NOTE) The Xpert Xpress SARS-CoV-2/FLU/RSV plus assay is intended as an aid in the diagnosis of influenza from Nasopharyngeal swab specimens and should not be used as a sole basis for treatment. Nasal washings and aspirates are unacceptable for Xpert Xpress  SARS-CoV-2/FLU/RSV testing.  Fact Sheet for Patients: EntrepreneurPulse.com.au  Fact Sheet for Healthcare Providers: IncredibleEmployment.be  This test is not yet approved or cleared by the [place] FDA and has been authorized for detection and/or diagnosis of SARS-CoV-2 by FDA under an Emergency Use Authorization (EUA). This EUA will remain in effect (meaning this test can be used) for the duration of the COVID-19 declaration under Section 564(b)(1) of the Act, 21 U.S.C. section 360bbb-3(b)(1), unless the authorization is terminated or revoked.  Performed at [facility], [place] [address], [place], [place] [address]   Blood culture (routine x 2)     Status: None (Preliminary result)   Collection Time: 10/22/20  5:12 PM   Specimen: BLOOD  Result Value Ref Range Status   Specimen Description BLOOD SITE NOT SPECIFIED  Final   Special Requests   Final    BOTTLES DRAWN AEROBIC AND ANAEROBIC Blood Culture results may not be optimal due to an inadequate volume of blood received in culture bottles   Culture   Final    NO GROWTH 3 DAYS Performed at [facility], [place] [address] [place], [place] [address]    Report Status PENDING  Incomplete  Blood culture (routine x 2)     Status: None (Preliminary result)   Collection Time: 10/22/20  5:23 PM   Specimen: BLOOD  Result Value Ref Range Status   Specimen Description BLOOD SITE NOT SPECIFIED  Final   Special Requests   Final    BOTTLES DRAWN AEROBIC AND ANAEROBIC Blood Culture results may not be optimal due to an inadequate volume of blood received in culture bottles   Culture   Final    NO GROWTH 3 DAYS Performed at [facility], [place] [address], [place], [place] [address]    Report Status PENDING  Incomplete          Radiology Studies: No results found.      Scheduled Meds:  apixaban  2.5 mg Oral BID   azithromycin  500 mg Oral Daily   diltiazem  180 mg Oral  Daily   levothyroxine  137 mcg Oral QAC breakfast   magnesium oxide  400 mg Oral Daily   methylPREDNISolone (SOLU-MEDROL) injection  60 mg Intravenous Q12H   mometasone-formoterol  2 puff Inhalation BID   polyethylene glycol  17 g Oral Daily   umeclidinium bromide  1 puff Inhalation Daily   Continuous Infusions:          Aline August, MD [facility] 10/26/2020, 10:01 AM

## 2020-10-26 NOTE — Plan of Care (Signed)
  Problem: Nutrition: Goal: Adequate nutrition will be maintained Outcome: Progressing   Problem: Coping: Goal: Level of anxiety will decrease Outcome: Progressing   Problem: Elimination: Goal: Will not experience complications related to bowel motility Outcome: Progressing   Problem: Safety: Goal: Ability to remain free from injury will improve Outcome: Progressing

## 2020-10-27 ENCOUNTER — Inpatient Hospital Stay (HOSPITAL_COMMUNITY): Payer: Medicare HMO

## 2020-10-27 DIAGNOSIS — J90 Pleural effusion, not elsewhere classified: Secondary | ICD-10-CM | POA: Diagnosis not present

## 2020-10-27 DIAGNOSIS — I480 Paroxysmal atrial fibrillation: Secondary | ICD-10-CM | POA: Diagnosis not present

## 2020-10-27 DIAGNOSIS — J441 Chronic obstructive pulmonary disease with (acute) exacerbation: Secondary | ICD-10-CM | POA: Diagnosis not present

## 2020-10-27 DIAGNOSIS — J189 Pneumonia, unspecified organism: Secondary | ICD-10-CM | POA: Diagnosis not present

## 2020-10-27 LAB — BASIC METABOLIC PANEL
Anion gap: 8 (ref 5–15)
BUN: 22 mg/dL (ref 8–23)
CO2: 35 mmol/L — ABNORMAL HIGH (ref 22–32)
Calcium: 8.7 mg/dL — ABNORMAL LOW (ref 8.9–10.3)
Chloride: 93 mmol/L — ABNORMAL LOW (ref 98–111)
Creatinine, Ser: 0.65 mg/dL (ref 0.44–1.00)
GFR, Estimated: >60 mL/min (ref >60)
Glucose, Bld: 194 mg/dL — ABNORMAL HIGH (ref 70–99)
Potassium: 4.4 mmol/L (ref 3.5–5.1)
Sodium: 136 mmol/L (ref 135–145)

## 2020-10-27 LAB — CULTURE, BLOOD (ROUTINE X 2)
Culture: NO GROWTH
Culture: NO GROWTH

## 2020-10-27 LAB — GLUCOSE, CAPILLARY
Glucose-Capillary: 206 mg/dL — ABNORMAL HIGH (ref 70–99)
Glucose-Capillary: 175 mg/dL — ABNORMAL HIGH (ref 70–99)
Glucose-Capillary: 197 mg/dL — ABNORMAL HIGH (ref 70–99)

## 2020-10-27 LAB — MAGNESIUM: Magnesium: 2.3 mg/dL (ref 1.7–2.4)

## 2020-10-27 MED ORDER — FUROSEMIDE 10 MG/ML IJ SOLN
40.0000 mg | Freq: Every day | INTRAMUSCULAR | Status: DC
  Administered 2020-10-27 – 2020-10-28 (×2): 40 mg via INTRAVENOUS
  Filled 2020-10-27 (×2): qty 4

## 2020-10-27 MED ORDER — ALBUTEROL SULFATE (2.5 MG/3ML) 0.083% IN NEBU
2.5000 mg | INHALATION_SOLUTION | Freq: Two times a day (BID) | RESPIRATORY_TRACT | Status: DC
  Administered 2020-10-27 – 2020-10-28 (×2): 2.5 mg via RESPIRATORY_TRACT
  Filled 2020-10-27 (×2): qty 3

## 2020-10-27 MED ORDER — ALBUTEROL SULFATE (2.5 MG/3ML) 0.083% IN NEBU: 2.5000 mg | INHALATION_SOLUTION | RESPIRATORY_TRACT | Status: DC | PRN

## 2020-10-27 MED ORDER — ALBUTEROL SULFATE (2.5 MG/3ML) 0.083% IN NEBU
2.5000 mg | INHALATION_SOLUTION | Freq: Four times a day (QID) | RESPIRATORY_TRACT | Status: DC
  Administered 2020-10-27 (×2): 2.5 mg via RESPIRATORY_TRACT
  Filled 2020-10-27 (×2): qty 3

## 2020-10-27 NOTE — Progress Notes (Signed)
Pt continues to have SOB on exertion, or with slight movement. Reached out to on call MD. New orders noted for duo neb tx. See mar for details.

## 2020-10-27 NOTE — Progress Notes (Signed)
Patient ID: Heather [name], female   DOB: [birth year], 87 y.o.   MRN: [id number]  PROGRESS NOTE    Heather [name]  GDJ:[id number] DOB: 25-Jul-1926 DOA: 10/22/2020 PCP: Burnard Bunting, MD   Brief Narrative:  87 y.o. female with medical history significant of COPD, paroxysmal A. Fib, chronic hypoxic respiratory failure on 3 L of oxygen per minute at home,diastolic congestive heart failure, pulmonary nodule, abdominal aortic aneurysm, hypothyroidism, hypertension presented to the emergency department  with complaints of increased weakness, fatigue, shortness of breath and was diagnosed with community-acquired pneumonia along with right pleural effusion and hypoxia.  Assessment & Plan:   Acute on chronic hypoxic respiratory failure Community-acquired pneumonia Possible COPD exacerbation -Normally uses 3 L oxygen via nasal cannula at home.  Currently on 3 L oxygen via nasal cannula. -Has completed 5-day course of antibiotic therapy.  Cultures negative so far.  COVID and influenza negative on admission -Continue current dose of Solu-Medrol for another 24 hours.  Patient still complains of shortness of breath with slight exertion.  Will get a chest x-ray.  Continue Dulera and nebs.  Acute on chronic diastolic CHF -EF 62% on most recent echocardiogram.  Strict input and output.  Daily weights.   -Continue Lasix 40 mg IV daily.  Hypokalemia -Improved  Mild right sided pleural effusion -Not enough to be tapped.  Failed attempt by IR.  Continue diuresis  Hypertension -continue Cardizem  Paroxysmal A. Fib -Currently rate controlled.  Continue Cardizem.  Continue Eliquis  Leukocytosis -Resolved  Hypothyroidism -Continue Synthroid  Generalized deconditioning -PT recommends home health PT/OT  History of abdominal aortic aneurysm -Outpatient PCP follow-up and is appropriate follow-up.  Hyperglycemia -Possibly from steroid use.  Continue CBGs with SSI.   DVT prophylaxis:  Eliquis Code Status: DNR Family Communication: None at bedside Disposition Plan: Status is: Inpatient  Remains inpatient appropriate because:Inpatient level of care appropriate due to severity of illness  Dispo: The patient is from: Home              Anticipated d/c is to: Home in 1 to 2 days if patient improved symptomatically.              Patient currently is not medically stable to d/c.   Difficult to place patient No  Consultants: None  Procedures: None  Antimicrobials: Rocephin and Zithromax   Subjective: Patient seen and examined at bedside.  Still feels very short of breath with even minimal exertion and complains of intermittent chest tightness.  Does not feel ready to go home today yet.  No overnight fever or vomiting reported.   Objective: Vitals:   10/26/20 2134 10/26/20 2303 10/27/20 0400 10/27/20 0500  BP: (!) 150/66     Pulse:      Resp:  17    Temp: 98.7 F (37.1 C)  (!) 97.4 F (36.3 C)   TempSrc: Oral  Oral   SpO2:      Weight:    45.9 kg    Intake/Output Summary (Last 24 hours) at 10/27/2020 0905 Last data filed at 10/27/2020 0606 Gross per 24 hour  Intake 360 ml  Output 1800 ml  Net -1440 ml    Filed Weights   10/25/20 0500 10/26/20 0500 10/27/20 0500  Weight: 44.3 kg 44.8 kg 45.9 kg    Examination:  General exam: No acute distress.  Elderly female lying in bed.  Still on 3 L oxygen via nasal cannula. Respiratory system: Decreased breath sounds at bases bilaterally with bibasilar crackles  cardiovascular system: Rate controlled, S1-S2 heard gastrointestinal system: Abdomen is distended slightly, soft and nontender.  Bowel sounds are heard extremities: Trace lower extremity IMA present; no clubbing Central nervous system: Awake and alert.  No focal neurological deficits.  Moves extremities  skin: No obvious ecchymosis/lesions psychiatry: Still gets anxious intermittently.  Flat affect otherwise    Data Reviewed: I have personally reviewed  following labs and imaging studies  CBC: Recent Labs  Lab 10/22/20 1150 10/23/20 0335  WBC 11.1* 10.6*  NEUTROABS 8.1*  --   HGB 15.3* 14.3  HCT 45.2 45.0  MCV 96.2 100.9*  PLT 330 161    Basic Metabolic Panel: Recent Labs  Lab 10/22/20 1150 10/22/20 1710 10/23/20 0335 10/25/20 0131 10/26/20 0800 10/27/20 0122  NA 139  --  141 137 133* 136  K 2.8*  --  2.8* 2.9* 4.4 4.4  CL 94*  --  101 96* 94* 93*  CO2 35*  --  30 33* 32 35*  GLUCOSE 123*  --  104* 258* 232* 194*  BUN 11  --  8 16 20 22   CREATININE 0.82  --  0.68 0.64 0.63 0.65  CALCIUM 9.3  --  8.6* 8.4* 8.9 8.7*  MG  --  2.2  --  2.2 2.2 2.3    GFR: Estimated Creatinine Clearance: 33.2 mL/min (by C-G formula based on SCr of 0.65 mg/dL). Liver Function Tests: Recent Labs  Lab 10/22/20 1150  AST 27  ALT 18  ALKPHOS 97  BILITOT 0.9  PROT 5.9*  ALBUMIN 3.7    No results for input(s): LIPASE, AMYLASE in the last 168 hours. No results for input(s): AMMONIA in the last 168 hours. Coagulation Profile: No results for input(s): INR, PROTIME in the last 168 hours. Cardiac Enzymes: No results for input(s): CKTOTAL, CKMB, CKMBINDEX, TROPONINI in the last 168 hours. BNP (last 3 results) No results for input(s): PROBNP in the last 8760 hours. HbA1C: Recent Labs    10/26/20 1318  HGBA1C 5.5   CBG: Recent Labs  Lab 10/26/20 1601 10/26/20 2135 10/27/20 0717  GLUCAP 338* 164* 206*   Lipid Profile: No results for input(s): CHOL, HDL, LDLCALC, TRIG, CHOLHDL, LDLDIRECT in the last 72 hours. Thyroid Function Tests: No results for input(s): TSH, T4TOTAL, FREET4, T3FREE, THYROIDAB in the last 72 hours. Anemia Panel: No results for input(s): VITAMINB12, FOLATE, FERRITIN, TIBC, IRON, RETICCTPCT in the last 72 hours. Sepsis Labs: Recent Labs  Lab 10/22/20 1151 10/23/20 1326 10/24/20 0125 10/25/20 0131  PROCALCITON  --  45.11 51.51 54.66  LATICACIDVEN 2.0*  --   --   --      Recent Results (from the past  240 hour(s))  Resp Panel by RT-PCR (Flu A&B, Covid) Nasopharyngeal Swab     Status: None   Collection Time: 10/22/20 11:50 AM   Specimen: Nasopharyngeal Swab; Nasopharyngeal(NP) swabs in vial transport medium  Result Value Ref Range Status   SARS Coronavirus 2 by RT PCR NEGATIVE NEGATIVE Final    Comment: (NOTE) SARS-CoV-2 target nucleic acids are NOT DETECTED.  The SARS-CoV-2 RNA is generally detectable in upper respiratory specimens during the acute phase of infection. The lowest concentration of SARS-CoV-2 viral copies this assay can detect is 138 copies/mL. A negative result does not preclude SARS-Cov-2 infection and should not be used as the sole basis for treatment or other patient management decisions. A negative result may occur with  improper specimen collection/handling, submission of specimen other than nasopharyngeal swab, presence of viral mutation(s) within  the areas targeted by this assay, and inadequate number of viral copies(<138 copies/mL). A negative result must be combined with clinical observations, patient history, and epidemiological information. The expected result is Negative.  Fact Sheet for Patients:  EntrepreneurPulse.com.au  Fact Sheet for Healthcare Providers:  IncredibleEmployment.be  This test is no t yet approved or cleared by the [place] FDA and  has been authorized for detection and/or diagnosis of SARS-CoV-2 by FDA under an Emergency Use Authorization (EUA). This EUA will remain  in effect (meaning this test can be used) for the duration of the COVID-19 declaration under Section 564(b)(1) of the Act, 21 U.S.C.section 360bbb-3(b)(1), unless the authorization is terminated  or revoked sooner.       Influenza A by PCR NEGATIVE NEGATIVE Final   Influenza B by PCR NEGATIVE NEGATIVE Final    Comment: (NOTE) The Xpert Xpress SARS-CoV-2/FLU/RSV plus assay is intended as an aid in the diagnosis of influenza  from Nasopharyngeal swab specimens and should not be used as a sole basis for treatment. Nasal washings and aspirates are unacceptable for Xpert Xpress SARS-CoV-2/FLU/RSV testing.  Fact Sheet for Patients: EntrepreneurPulse.com.au  Fact Sheet for Healthcare Providers: IncredibleEmployment.be  This test is not yet approved or cleared by the [place] FDA and has been authorized for detection and/or diagnosis of SARS-CoV-2 by FDA under an Emergency Use Authorization (EUA). This EUA will remain in effect (meaning this test can be used) for the duration of the COVID-19 declaration under Section 564(b)(1) of the Act, 21 U.S.C. section 360bbb-3(b)(1), unless the authorization is terminated or revoked.  Performed at [facility], [place] [address] [place], [place] [address]   Blood culture (routine x 2)     Status: None (Preliminary result)   Collection Time: 10/22/20  5:12 PM   Specimen: BLOOD  Result Value Ref Range Status   Specimen Description BLOOD SITE NOT SPECIFIED  Final   Special Requests   Final    BOTTLES DRAWN AEROBIC AND ANAEROBIC Blood Culture results may not be optimal due to an inadequate volume of blood received in culture bottles   Culture   Final    NO GROWTH 3 DAYS Performed at [facility], [place] [address] [place], [place] [address]    Report Status PENDING  Incomplete  Blood culture (routine x 2)     Status: None (Preliminary result)   Collection Time: 10/22/20  5:23 PM   Specimen: BLOOD  Result Value Ref Range Status   Specimen Description BLOOD SITE NOT SPECIFIED  Final   Special Requests   Final    BOTTLES DRAWN AEROBIC AND ANAEROBIC Blood Culture results may not be optimal due to an inadequate volume of blood received in culture bottles   Culture   Final    NO GROWTH 3 DAYS Performed at [facility], [place] [address], [place], [place] [address]    Report Status PENDING  Incomplete           Radiology Studies: No results found.      Scheduled Meds:  albuterol  2.5 mg Nebulization Q6H   apixaban  2.5 mg Oral BID   azithromycin  500 mg Oral Daily   diltiazem  180 mg Oral Daily   insulin aspart  0-9 Units Subcutaneous TID WC   levothyroxine  137 mcg Oral QAC breakfast   magnesium oxide  400 mg Oral Daily   methylPREDNISolone (SOLU-MEDROL) injection  40 mg Intravenous Q12H   mometasone-formoterol  2 puff  Inhalation BID   polyethylene glycol  17 g Oral Daily   umeclidinium bromide  1 puff Inhalation Daily   Continuous Infusions:          Aline August, MD [facility] 10/27/2020, 9:05 AM

## 2020-10-28 DIAGNOSIS — I5032 Chronic diastolic (congestive) heart failure: Secondary | ICD-10-CM | POA: Diagnosis not present

## 2020-10-28 DIAGNOSIS — I110 Hypertensive heart disease with heart failure: Secondary | ICD-10-CM | POA: Diagnosis not present

## 2020-10-28 DIAGNOSIS — J441 Chronic obstructive pulmonary disease with (acute) exacerbation: Secondary | ICD-10-CM | POA: Diagnosis not present

## 2020-10-28 DIAGNOSIS — J189 Pneumonia, unspecified organism: Secondary | ICD-10-CM | POA: Diagnosis not present

## 2020-10-28 DIAGNOSIS — M810 Age-related osteoporosis without current pathological fracture: Secondary | ICD-10-CM | POA: Diagnosis not present

## 2020-10-28 DIAGNOSIS — J9611 Chronic respiratory failure with hypoxia: Secondary | ICD-10-CM | POA: Diagnosis not present

## 2020-10-28 LAB — GLUCOSE, CAPILLARY: Glucose-Capillary: 193 mg/dL — ABNORMAL HIGH (ref 70–99)

## 2020-10-28 MED ORDER — PREDNISONE 20 MG PO TABS: 40.0000 mg | ORAL_TABLET | Freq: Every day | ORAL | 0 refills | Status: AC

## 2020-10-28 MED ORDER — GUAIFENESIN-DM 100-10 MG/5ML PO SYRP: 10.0000 mL | ORAL_SOLUTION | Freq: Four times a day (QID) | ORAL | 1 refills | Status: AC | PRN

## 2020-10-28 NOTE — TOC Transition Note (Signed)
Transition of Care [facility]) - CM/SW Discharge Note   Patient Details  Name: Heather [name] MRN: [id number] Date of Birth: 11-Jun-1926  [facility] CM/SW Contact:  Pollie Friar, RN Phone Number: 10/28/2020, 11:22 AM   Clinical Narrative:    Patient is discharging home with [facility] services through [place]. Stacey with [facility] accepted the referral. Information on the AVS.  Pt has at home: oxygen through [place] and an [employer]/ walker/ rollator/ shower seat. Pt states she does her own medications at home and denies any issues.  Family provides needed transportation. Pt has transport home today.   Final next level of care: Home w Home Health Services Barriers to Discharge: No Barriers Identified   Patient Goals and CMS Choice   CMS Medicare.gov Compare Post Acute Care list provided to:: Patient Choice offered to / list presented to : Patient  Discharge Placement                       Discharge Plan and Services                          HH Arranged: PT, OT [facility] Agency: [employer] Date [place]: 10/28/20   Representative spoke with at [place]: [place] ([place]) Interventions     Readmission Risk Interventions No flowsheet data found.

## 2020-10-28 NOTE — Plan of Care (Signed)
  Problem: Activity: Goal: Risk for activity intolerance will decrease Outcome: Progressing   

## 2020-10-28 NOTE — Discharge Summary (Signed)
Physician Discharge Summary  Heather [name]:[id number] DOB: 1926/06/25 DOA: 10/22/2020  PCP: Burnard Bunting, MD  Admit date: 10/22/2020 Discharge date: 10/28/2020  Admitted From: Home Disposition: Home  Recommendations for Outpatient Follow-up:  Follow up with PCP in 1 week with repeat CBC/BMP Follow up in ED if symptoms worsen or new appear   Home Health: No Equipment/Devices: None  Discharge Condition: Stable CODE STATUS: DNR  diet recommendation: Heart healthy/fluid restriction of up to 1500 cc a day  Brief/Interim Summary: 87 y.o. female with medical history significant of COPD, paroxysmal A. Fib, chronic hypoxic respiratory failure on 3 L of oxygen per minute at home,diastolic congestive heart failure, pulmonary nodule, abdominal aortic aneurysm, hypothyroidism, hypertension presented to the emergency department  with complaints of increased weakness, fatigue, shortness of breath and was diagnosed with community-acquired pneumonia along with right pleural effusion and hypoxia.  During the hospitalization, she was treated with IV antibiotics along with IV Solu-Medrol and IV Lasix.  Her overall condition has gradually improved.  She feels much better and feels okay to go home today.  She will be discharged home on oral prednisone and Lasix.  She has completed antibiotic treatment.    Discharge Diagnoses:   Acute on chronic hypoxic respiratory failure Community-acquired pneumonia Possible COPD exacerbation -Normally uses 3 L oxygen via nasal cannula at home.  Currently on 3 L oxygen via nasal cannula. -Has completed 5-day course of antibiotic therapy.  Cultures negative so far.  COVID and influenza negative on admission -Currently on IV Solu-Medrol.  Respiratory status has improved and remained stable.  She feels better today.  Feels okay to go home today.   -Discharge patient on oral prednisone 40 mg daily for 7 days.  Continue home inhaler regimen.    Acute on chronic  diastolic CHF -EF 15% on most recent echocardiogram.  Continue diet and fluid restriction.  Treated with Lasix 40 mg IV daily.  Resume 40 mg Lasix daily orally upon discharge.  Outpatient follow-up with cardiology.    Hypokalemia -Improved.  Continue supplementation.  Mild right sided pleural effusion -Not enough to be tapped.  Failed attempt by IR.  Continue diuresis   Hypertension -continue Cardizem   Paroxysmal A. Fib -Currently rate controlled.  Continue Cardizem.  Continue Eliquis  Leukocytosis -Resolved   Hypothyroidism -Continue Synthroid   Generalized deconditioning -PT recommends home health PT/OT   History of abdominal aortic aneurysm -Outpatient PCP follow-up and is appropriate follow-up.   Hyperglycemia -Possibly from steroid use.  Outpatient follow-up.  Discharge Instructions  Discharge Instructions     Diet - low sodium heart healthy   Complete by: As directed    Increase activity slowly   Complete by: As directed       Allergies as of 10/28/2020       Reactions   Sulfa Antibiotics    Other reaction(s): nausea & vomiting   Sulfamethoxazole-trimethoprim    Other reaction(s): Unknown   Ace Inhibitors Cough   Elemental Sulfur Nausea And Vomiting        Medication List     TAKE these medications    albuterol (2.5 MG/3ML) 0.083% nebulizer solution Commonly known as: PROVENTIL Take 2.5 mg by nebulization 2 (two) times daily as needed for wheezing or shortness of breath.   albuterol 108 (90 Base) MCG/ACT inhaler Commonly known as: VENTOLIN HFA Inhale 2 puffs into the lungs every 4 (four) hours as needed for wheezing or shortness of breath.   apixaban 2.5 MG Tabs tablet Commonly known  as: Eliquis Take 1 tablet (2.5 mg total) by mouth 2 (two) times daily.   BIOGAIA PROBIOTIC PO Take 1 capsule by mouth daily.   Breztri Aerosphere 160-9-4.8 MCG/ACT Aero Generic drug: Budeson-Glycopyrrol-Formoterol Take 2 puffs by mouth 2 (two) times  daily.   calcium citrate-vitamin D 315-200 MG-UNIT tablet Commonly known as: CITRACAL+D Take 1 tablet by mouth daily.   diltiazem 180 MG 24 hr capsule Commonly known as: CARDIZEM CD Take 180 mg by mouth daily.   feeding supplement Liqd Take 237 mLs by mouth 2 (two) times daily between meals.   fluticasone 50 MCG/ACT nasal spray Commonly known as: FLONASE USE 2 SPRAY(S) IN EACH NOSTRIL ONCE DAILY IN THE EVENING What changed: See the new instructions.   furosemide 40 MG tablet Commonly known as: LASIX Take 40 mg by mouth daily.   guaiFENesin-dextromethorphan 100-10 MG/5ML syrup Commonly known as: ROBITUSSIN DM Take 10 mLs by mouth every 6 (six) hours as needed for cough. What changed: how much to take   levothyroxine 137 MCG tablet Commonly known as: SYNTHROID Take 137 mcg by mouth daily before breakfast.   magnesium oxide 400 MG tablet Commonly known as: MAG-OX Take 400 mg by mouth daily.   multivitamin with minerals tablet Take 1 tablet by mouth daily.   polyethylene glycol 17 g packet Commonly known as: MIRALAX / GLYCOLAX Take 17 g by mouth daily.   potassium chloride SA 20 MEQ tablet Commonly known as: KLOR-CON Take 20 mEq by mouth daily.   predniSONE 20 MG tablet Commonly known as: DELTASONE Take 2 tablets (40 mg total) by mouth daily with breakfast for 7 days. What changed:  medication strength how much to take when to take this   THERATEARS OP Place 2 drops into both eyes daily.        Follow-up Information     Burnard Bunting, MD. Schedule an appointment as soon as possible for a visit in 1 week(s).   Specialty: Internal Medicine Contact information: [place] [address] [phone]                Allergies  Allergen Reactions   Sulfa Antibiotics     Other reaction(s): nausea & vomiting   Sulfamethoxazole-Trimethoprim     Other reaction(s): Unknown   Ace Inhibitors Cough   Elemental Sulfur Nausea And  Vomiting    Consultations: None   Procedures/Studies: DG Chest 2 View  Result Date: 10/27/2020 CLINICAL DATA:  Pneumonia. Nausea, fatigue, and headache. Shortness of breath. Dyspnea. EXAM: CHEST - 2 VIEW COMPARISON:  October 22, 2020 FINDINGS: The cardiomediastinal silhouette is stable. A right-sided pleural effusion with underlying opacity is mildly worsened in the interval. Emphysematous changes are seen in the lungs. A stable nodular density projects over the right lateral mid lung, stable. No pneumothorax. No acute interval change. IMPRESSION: 1. Emphysematous changes in the lungs. 2. The right effusion with underlying opacity is mildly worsened in the interval. 3. Nodular density in the right mid lung laterally is stable. Electronically Signed   By: Dorise Bullion III M.D   On: 10/27/2020 12:34   DG Chest 2 View  Result Date: 10/22/2020 CLINICAL DATA:  Shortness of breath and chest tightness. Status post treatment for pneumonia. EXAM: CHEST - 2 VIEW COMPARISON:  02/27/2020 FINDINGS: Stable cardiomediastinal contours. Aortic atherosclerotic calcifications. There is a small to moderate right pleural effusion. Lungs are hyperinflated and there are coarsened interstitial markings compatible with emphysema. Bones appear diffusely osteopenic. Multi level compression fractures are identified  throughout the thoracic spine, similar to previous exam. IMPRESSION: 1. Small to moderate right pleural effusion. 2. Emphysema. Electronically Signed   By: Kerby Moors M.D.   On: 10/22/2020 12:30   IR US CHEST  Result Date: 10/24/2020 CLINICAL DATA:  87 year old female with CHF, presented with shortness of breath. CT chest with a small volume right pleural effusion, the IR consulted for possible thoracentesis EXAM: LIMITED ULTRASOUND OF THE CHEST TECHNIQUE: A limited preprocedure ultrasound was performed of the right chest, prior to anticipated thoracentesis procedure. COMPARISON:  CT chest, 07/29/2020.  Chest  radiograph, 09/22/2020. FINDINGS: Small volume right pleural effusion. No significant fluid collection, or safe trajectory found. The procedure was aborted. IMPRESSION: 1. Small volume right pleural effusion. 2. Thoracentesis procedure was not performed. The patient's providers were informed of the findings. Electronically Signed   By: Michaelle Birks MD   On: 10/24/2020 07:24      Subjective: Patient seen and examined at bedside.  She feels better today and feels okay going home today.  Still short of breath with some exertion.  Denies worsening cough or fever.  No overnight vomiting  Discharge Exam: Vitals:   10/28/20 0817 10/28/20 0911  BP:  (!) 144/83  Pulse:  99  Resp: (!) 24 (!) 21  Temp:  97.8 F (36.6 C)  SpO2:  100%    General: Pt is alert, awake, not in acute distress.  Elderly female lying in bed.  Slightly hard of hearing.  Currently on 3 L oxygen via nasal cannula. Cardiovascular: rate controlled, S1/S2 + Respiratory: bilateral decreased breath sounds at bases with some scattered crackles and intermittent tachypnea Abdominal: Soft, NT, ND, bowel sounds + Extremities: Trace lower extremity edema; no cyanosis    The results of significant diagnostics from this hospitalization (including imaging, microbiology, ancillary and laboratory) are listed below for reference.     Microbiology: Recent Results (from the past 240 hour(s))  Resp Panel by RT-PCR (Flu A&B, Covid) Nasopharyngeal Swab     Status: None   Collection Time: 10/22/20 11:50 AM   Specimen: Nasopharyngeal Swab; Nasopharyngeal(NP) swabs in vial transport medium  Result Value Ref Range Status   SARS Coronavirus 2 by RT PCR NEGATIVE NEGATIVE Final    Comment: (NOTE) SARS-CoV-2 target nucleic acids are NOT DETECTED.  The SARS-CoV-2 RNA is generally detectable in upper respiratory specimens during the acute phase of infection. The lowest concentration of SARS-CoV-2 viral copies this assay can detect is 138  copies/mL. A negative result does not preclude SARS-Cov-2 infection and should not be used as the sole basis for treatment or other patient management decisions. A negative result may occur with  improper specimen collection/handling, submission of specimen other than nasopharyngeal swab, presence of viral mutation(s) within the areas targeted by this assay, and inadequate number of viral copies(<138 copies/mL). A negative result must be combined with clinical observations, patient history, and epidemiological information. The expected result is Negative.  Fact Sheet for Patients:  EntrepreneurPulse.com.au  Fact Sheet for Healthcare Providers:  IncredibleEmployment.be  This test is no t yet approved or cleared by the [place] FDA and  has been authorized for detection and/or diagnosis of SARS-CoV-2 by FDA under an Emergency Use Authorization (EUA). This EUA will remain  in effect (meaning this test can be used) for the duration of the COVID-19 declaration under Section 564(b)(1) of the Act, 21 U.S.C.section 360bbb-3(b)(1), unless the authorization is terminated  or revoked sooner.       Influenza A by PCR NEGATIVE  NEGATIVE Final   Influenza B by PCR NEGATIVE NEGATIVE Final    Comment: (NOTE) The Xpert Xpress SARS-CoV-2/FLU/RSV plus assay is intended as an aid in the diagnosis of influenza from Nasopharyngeal swab specimens and should not be used as a sole basis for treatment. Nasal washings and aspirates are unacceptable for Xpert Xpress SARS-CoV-2/FLU/RSV testing.  Fact Sheet for Patients: EntrepreneurPulse.com.au  Fact Sheet for Healthcare Providers: IncredibleEmployment.be  This test is not yet approved or cleared by the [place] FDA and has been authorized for detection and/or diagnosis of SARS-CoV-2 by FDA under an Emergency Use Authorization (EUA). This EUA will remain in effect (meaning  this test can be used) for the duration of the COVID-19 declaration under Section 564(b)(1) of the Act, 21 U.S.C. section 360bbb-3(b)(1), unless the authorization is terminated or revoked.  Performed at [facility], [place] [address] [place], [place] [address]   Blood culture (routine x 2)     Status: None   Collection Time: 10/22/20  5:12 PM   Specimen: BLOOD  Result Value Ref Range Status   Specimen Description BLOOD SITE NOT SPECIFIED  Final   Special Requests   Final    BOTTLES DRAWN AEROBIC AND ANAEROBIC Blood Culture results may not be optimal due to an inadequate volume of blood received in culture bottles   Culture   Final    NO GROWTH 5 DAYS Performed at [facility], [place] [address], [place], [place] [address]    Report Status 10/27/2020 FINAL  Final  Blood culture (routine x 2)     Status: None   Collection Time: 10/22/20  5:23 PM   Specimen: BLOOD  Result Value Ref Range Status   Specimen Description BLOOD SITE NOT SPECIFIED  Final   Special Requests   Final    BOTTLES DRAWN AEROBIC AND ANAEROBIC Blood Culture results may not be optimal due to an inadequate volume of blood received in culture bottles   Culture   Final    NO GROWTH 5 DAYS Performed at [facility], [place] [address], [place],  [address]    Report Status 10/27/2020 FINAL  Final     Labs: BNP (last 3 results) Recent Labs    02/27/20 0832 07/29/20 0543 10/22/20 1150  BNP 67.7 79.0 91.4   Basic Metabolic Panel: Recent Labs  Lab 10/22/20 1150 10/22/20 1710 10/23/20 0335 10/25/20 0131 10/26/20 0800 10/27/20 0122  NA 139  --  141 137 133* 136  K 2.8*  --  2.8* 2.9* 4.4 4.4  CL 94*  --  101 96* 94* 93*  CO2 35*  --  30 33* 32 35*  GLUCOSE 123*  --  104* 258* 232* 194*  BUN 11  --  8 16 20 22   CREATININE 0.82  --  0.68 0.64 0.63 0.65  CALCIUM 9.3  --  8.6* 8.4* 8.9 8.7*  MG  --  2.2  --  2.2 2.2 2.3   Liver Function Tests: Recent Labs  Lab 10/22/20 1150  AST 27   ALT 18  ALKPHOS 97  BILITOT 0.9  PROT 5.9*  ALBUMIN 3.7   No results for input(s): LIPASE, AMYLASE in the last 168 hours. No results for input(s): AMMONIA in the last 168 hours. CBC: Recent Labs  Lab 10/22/20 1150 10/23/20 0335  WBC 11.1* 10.6*  NEUTROABS 8.1*  --   HGB 15.3* 14.3  HCT 45.2 45.0  MCV 96.2 100.9*  PLT 330 287   Cardiac  Enzymes: No results for input(s): CKTOTAL, CKMB, CKMBINDEX, TROPONINI in the last 168 hours. BNP: Invalid input(s): POCBNP CBG: Recent Labs  Lab 10/26/20 2135 10/27/20 0717 10/27/20 1144 10/27/20 1553 10/28/20 0706  GLUCAP 164* 206* 175* 197* 193*   D-Dimer No results for input(s): DDIMER in the last 72 hours. Hgb A1c Recent Labs    10/26/20 1318  HGBA1C 5.5   Lipid Profile No results for input(s): CHOL, HDL, LDLCALC, TRIG, CHOLHDL, LDLDIRECT in the last 72 hours. Thyroid function studies No results for input(s): TSH, T4TOTAL, T3FREE, THYROIDAB in the last 72 hours.  Invalid input(s): FREET3 Anemia work up No results for input(s): VITAMINB12, FOLATE, FERRITIN, TIBC, IRON, RETICCTPCT in the last 72 hours. Urinalysis    Component Value Date/Time   COLORURINE YELLOW 07/31/2020 1320   APPEARANCEUR CLEAR 07/31/2020 1320   LABSPEC 1.010 07/31/2020 1320   PHURINE 6.0 07/31/2020 1320   GLUCOSEU NEGATIVE 07/31/2020 1320   HGBUR NEGATIVE 07/31/2020 1320   BILIRUBINUR NEGATIVE 07/31/2020 1320   KETONESUR NEGATIVE 07/31/2020 1320   PROTEINUR NEGATIVE 07/31/2020 1320   UROBILINOGEN 0.2 05/17/2013 1207   NITRITE NEGATIVE 07/31/2020 1320   LEUKOCYTESUR NEGATIVE 07/31/2020 1320   Sepsis Labs Invalid input(s): PROCALCITONIN,  WBC,  LACTICIDVEN Microbiology Recent Results (from the past 240 hour(s))  Resp Panel by RT-PCR (Flu A&B, Covid) Nasopharyngeal Swab     Status: None   Collection Time: 10/22/20 11:50 AM   Specimen: Nasopharyngeal Swab; Nasopharyngeal(NP) swabs in vial transport medium  Result Value Ref Range Status   SARS  Coronavirus 2 by RT PCR NEGATIVE NEGATIVE Final    Comment: (NOTE) SARS-CoV-2 target nucleic acids are NOT DETECTED.  The SARS-CoV-2 RNA is generally detectable in upper respiratory specimens during the acute phase of infection. The lowest concentration of SARS-CoV-2 viral copies this assay can detect is 138 copies/mL. A negative result does not preclude SARS-Cov-2 infection and should not be used as the sole basis for treatment or other patient management decisions. A negative result may occur with  improper specimen collection/handling, submission of specimen other than nasopharyngeal swab, presence of viral mutation(s) within the areas targeted by this assay, and inadequate number of viral copies(<138 copies/mL). A negative result must be combined with clinical observations, patient history, and epidemiological information. The expected result is Negative.  Fact Sheet for Patients:  EntrepreneurPulse.com.au  Fact Sheet for Healthcare Providers:  IncredibleEmployment.be  This test is no t yet approved or cleared by the [place] FDA and  has been authorized for detection and/or diagnosis of SARS-CoV-2 by FDA under an Emergency Use Authorization (EUA). This EUA will remain  in effect (meaning this test can be used) for the duration of the COVID-19 declaration under Section 564(b)(1) of the Act, 21 U.S.C.section 360bbb-3(b)(1), unless the authorization is terminated  or revoked sooner.       Influenza A by PCR NEGATIVE NEGATIVE Final   Influenza B by PCR NEGATIVE NEGATIVE Final    Comment: (NOTE) The Xpert Xpress SARS-CoV-2/FLU/RSV plus assay is intended as an aid in the diagnosis of influenza from Nasopharyngeal swab specimens and should not be used as a sole basis for treatment. Nasal washings and aspirates are unacceptable for Xpert Xpress SARS-CoV-2/FLU/RSV testing.  Fact Sheet for  Patients: EntrepreneurPulse.com.au  Fact Sheet for Healthcare Providers: IncredibleEmployment.be  This test is not yet approved or cleared by the [place] FDA and has been authorized for detection and/or diagnosis of SARS-CoV-2 by FDA under an Emergency Use Authorization (EUA). This EUA will remain in  effect (meaning this test can be used) for the duration of the COVID-19 declaration under Section 564(b)(1) of the Act, 21 U.S.C. section 360bbb-3(b)(1), unless the authorization is terminated or revoked.  Performed at [facility], [place] [address] [place], [place] [address]   Blood culture (routine x 2)     Status: None   Collection Time: 10/22/20  5:12 PM   Specimen: BLOOD  Result Value Ref Range Status   Specimen Description BLOOD SITE NOT SPECIFIED  Final   Special Requests   Final    BOTTLES DRAWN AEROBIC AND ANAEROBIC Blood Culture results may not be optimal due to an inadequate volume of blood received in culture bottles   Culture   Final    NO GROWTH 5 DAYS Performed at [facility], [place] [address] [place], [place] [address]    Report Status 10/27/2020 FINAL  Final  Blood culture (routine x 2)     Status: None   Collection Time: 10/22/20  5:23 PM   Specimen: BLOOD  Result Value Ref Range Status   Specimen Description BLOOD SITE NOT SPECIFIED  Final   Special Requests   Final    BOTTLES DRAWN AEROBIC AND ANAEROBIC Blood Culture results may not be optimal due to an inadequate volume of blood received in culture bottles   Culture   Final    NO GROWTH 5 DAYS Performed at [facility], [place] [address], [place], [place] [address]    Report Status 10/27/2020 FINAL  Final     Time coordinating discharge: 35 minutes  SIGNED:   Aline August, MD  [facility] 10/28/2020, 10:46 AM

## 2020-11-03 DIAGNOSIS — J439 Emphysema, unspecified: Secondary | ICD-10-CM | POA: Diagnosis not present

## 2020-11-03 DIAGNOSIS — I110 Hypertensive heart disease with heart failure: Secondary | ICD-10-CM | POA: Diagnosis not present

## 2020-11-03 DIAGNOSIS — J9621 Acute and chronic respiratory failure with hypoxia: Secondary | ICD-10-CM | POA: Diagnosis not present

## 2020-11-03 DIAGNOSIS — A419 Sepsis, unspecified organism: Secondary | ICD-10-CM | POA: Diagnosis not present

## 2020-11-03 DIAGNOSIS — I5032 Chronic diastolic (congestive) heart failure: Secondary | ICD-10-CM | POA: Diagnosis not present

## 2020-11-03 DIAGNOSIS — J189 Pneumonia, unspecified organism: Secondary | ICD-10-CM | POA: Diagnosis not present

## 2020-11-03 DIAGNOSIS — I480 Paroxysmal atrial fibrillation: Secondary | ICD-10-CM | POA: Diagnosis not present

## 2020-11-03 DIAGNOSIS — I714 Abdominal aortic aneurysm, without rupture: Secondary | ICD-10-CM | POA: Diagnosis not present

## 2020-11-03 DIAGNOSIS — J90 Pleural effusion, not elsewhere classified: Secondary | ICD-10-CM | POA: Diagnosis not present

## 2020-11-06 DIAGNOSIS — E039 Hypothyroidism, unspecified: Secondary | ICD-10-CM | POA: Diagnosis not present

## 2020-11-06 DIAGNOSIS — J9611 Chronic respiratory failure with hypoxia: Secondary | ICD-10-CM | POA: Diagnosis not present

## 2020-11-06 DIAGNOSIS — J449 Chronic obstructive pulmonary disease, unspecified: Secondary | ICD-10-CM | POA: Diagnosis not present

## 2020-11-06 DIAGNOSIS — I5032 Chronic diastolic (congestive) heart failure: Secondary | ICD-10-CM | POA: Diagnosis not present

## 2020-11-06 DIAGNOSIS — I110 Hypertensive heart disease with heart failure: Secondary | ICD-10-CM | POA: Diagnosis not present

## 2020-11-06 DIAGNOSIS — J189 Pneumonia, unspecified organism: Secondary | ICD-10-CM | POA: Diagnosis not present

## 2020-11-06 DIAGNOSIS — I480 Paroxysmal atrial fibrillation: Secondary | ICD-10-CM | POA: Diagnosis not present

## 2020-11-06 DIAGNOSIS — J90 Pleural effusion, not elsewhere classified: Secondary | ICD-10-CM | POA: Diagnosis not present

## 2020-11-06 DIAGNOSIS — I5033 Acute on chronic diastolic (congestive) heart failure: Secondary | ICD-10-CM | POA: Diagnosis not present

## 2020-11-07 DIAGNOSIS — I5032 Chronic diastolic (congestive) heart failure: Secondary | ICD-10-CM | POA: Diagnosis not present

## 2020-11-07 DIAGNOSIS — I714 Abdominal aortic aneurysm, without rupture: Secondary | ICD-10-CM | POA: Diagnosis not present

## 2020-11-07 DIAGNOSIS — J189 Pneumonia, unspecified organism: Secondary | ICD-10-CM | POA: Diagnosis not present

## 2020-11-07 DIAGNOSIS — I480 Paroxysmal atrial fibrillation: Secondary | ICD-10-CM | POA: Diagnosis not present

## 2020-11-07 DIAGNOSIS — J439 Emphysema, unspecified: Secondary | ICD-10-CM | POA: Diagnosis not present

## 2020-11-07 DIAGNOSIS — J9621 Acute and chronic respiratory failure with hypoxia: Secondary | ICD-10-CM | POA: Diagnosis not present

## 2020-11-07 DIAGNOSIS — J90 Pleural effusion, not elsewhere classified: Secondary | ICD-10-CM | POA: Diagnosis not present

## 2020-11-07 DIAGNOSIS — I110 Hypertensive heart disease with heart failure: Secondary | ICD-10-CM | POA: Diagnosis not present

## 2020-11-07 DIAGNOSIS — A419 Sepsis, unspecified organism: Secondary | ICD-10-CM | POA: Diagnosis not present

## 2020-11-07 IMAGING — DX DG CHEST 1V PORT
1 series · 1 of 1 positions shown · non-contrast
Comparison: 04/24/2018

CLINICAL DATA: Shortness of breath, weakness and low O2
saturations.

EXAM:
PORTABLE CHEST 1 VIEW

[chest ap]
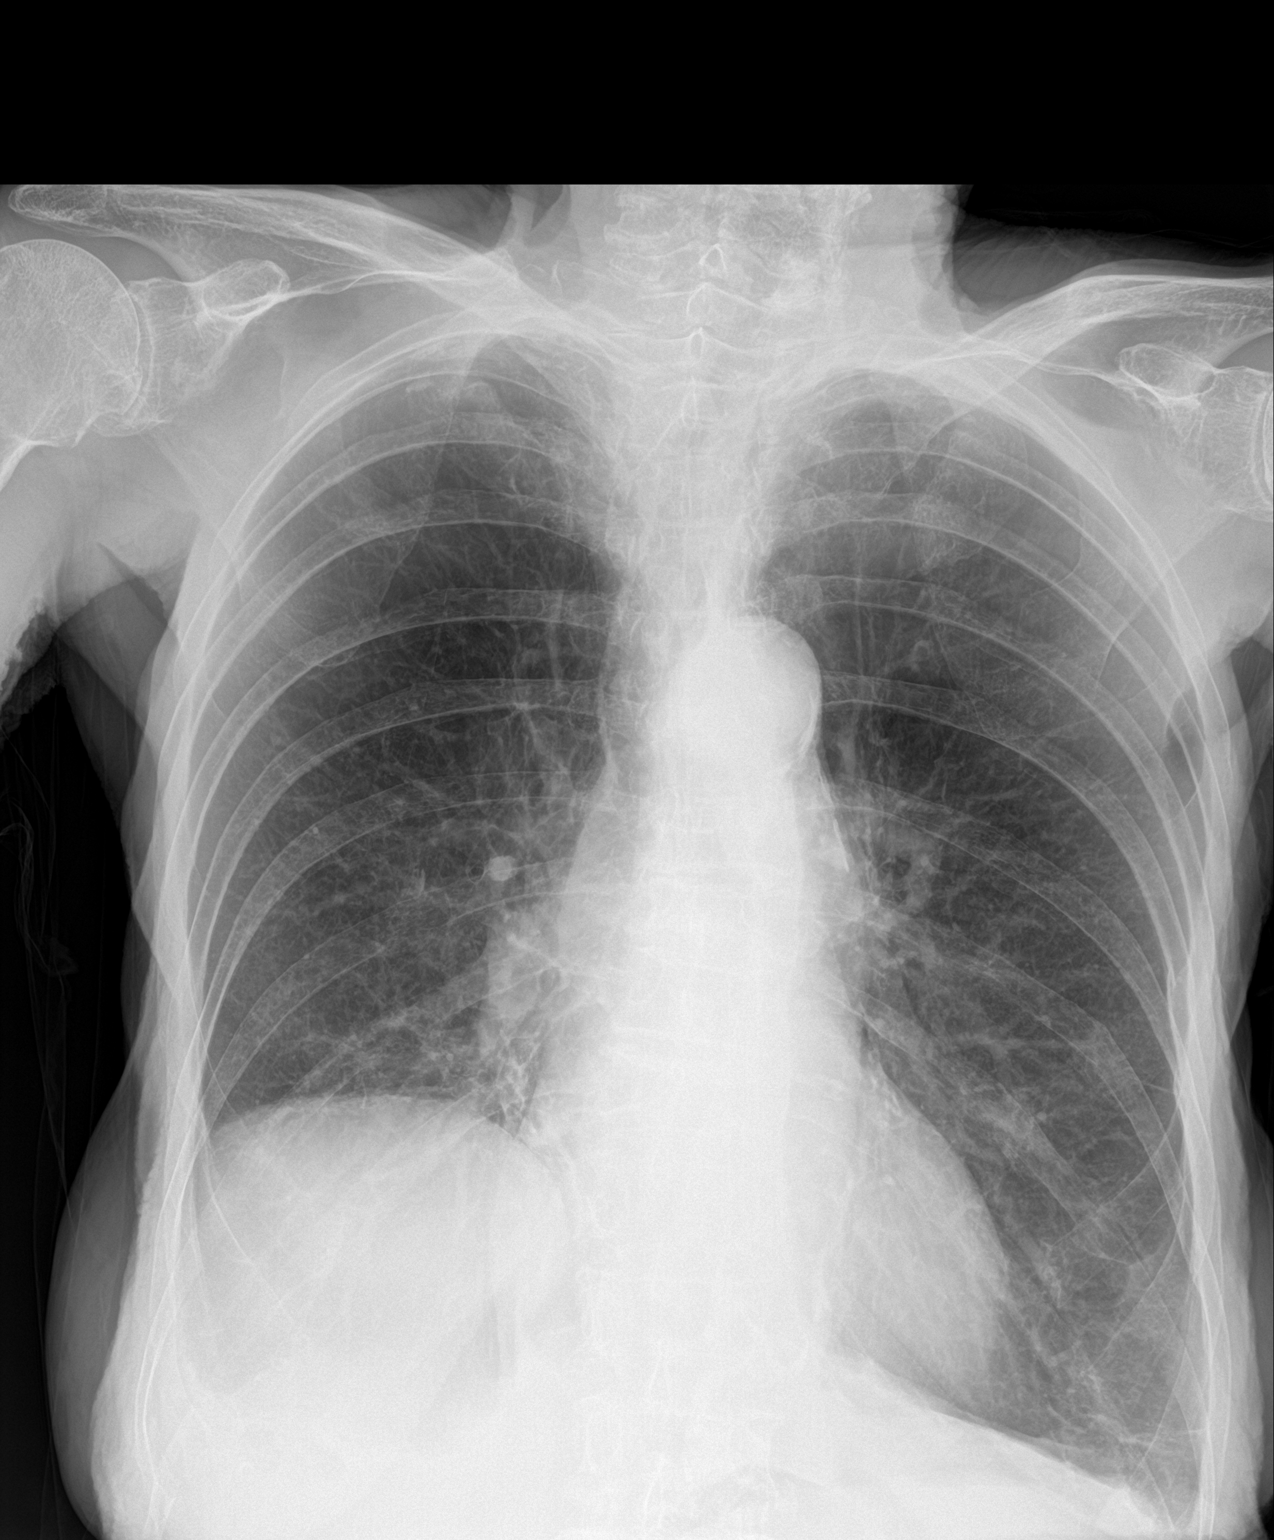

[1 of 1 positions shown; findings below may reference images not displayed]

FINDINGS: Right hemidiaphragm remains elevated. Left lung is hyperinflated
with signs of emphysema.

Cardiomediastinal contours are stable and there is atherosclerotic
change in the thoracic aorta and left carotid artery.

No acute bone finding.
IMPRESSION: No acute cardiopulmonary disease.

Dense calcification over left carotid artery and signs of aortic
atherosclerosis.

Aortic Atherosclerosis ([id number].O) and Emphysema ([id number].G).

## 2020-11-09 DIAGNOSIS — I5032 Chronic diastolic (congestive) heart failure: Secondary | ICD-10-CM | POA: Diagnosis not present

## 2020-11-09 DIAGNOSIS — J189 Pneumonia, unspecified organism: Secondary | ICD-10-CM | POA: Diagnosis not present

## 2020-11-09 DIAGNOSIS — A419 Sepsis, unspecified organism: Secondary | ICD-10-CM | POA: Diagnosis not present

## 2020-11-09 DIAGNOSIS — J9621 Acute and chronic respiratory failure with hypoxia: Secondary | ICD-10-CM | POA: Diagnosis not present

## 2020-11-09 DIAGNOSIS — J90 Pleural effusion, not elsewhere classified: Secondary | ICD-10-CM | POA: Diagnosis not present

## 2020-11-09 DIAGNOSIS — I714 Abdominal aortic aneurysm, without rupture: Secondary | ICD-10-CM | POA: Diagnosis not present

## 2020-11-09 DIAGNOSIS — J439 Emphysema, unspecified: Secondary | ICD-10-CM | POA: Diagnosis not present

## 2020-11-09 DIAGNOSIS — I110 Hypertensive heart disease with heart failure: Secondary | ICD-10-CM | POA: Diagnosis not present

## 2020-11-09 DIAGNOSIS — I480 Paroxysmal atrial fibrillation: Secondary | ICD-10-CM | POA: Diagnosis not present

## 2020-11-11 DIAGNOSIS — U071 COVID-19: Secondary | ICD-10-CM | POA: Diagnosis not present

## 2020-11-11 DIAGNOSIS — J449 Chronic obstructive pulmonary disease, unspecified: Secondary | ICD-10-CM | POA: Diagnosis not present

## 2020-11-12 ENCOUNTER — Other Ambulatory Visit (INDEPENDENT_AMBULATORY_CARE_PROVIDER_SITE_OTHER): Payer: Self-pay | Admitting: Otolaryngology

## 2020-11-12 DIAGNOSIS — J9621 Acute and chronic respiratory failure with hypoxia: Secondary | ICD-10-CM | POA: Diagnosis not present

## 2020-11-12 DIAGNOSIS — A419 Sepsis, unspecified organism: Secondary | ICD-10-CM | POA: Diagnosis not present

## 2020-11-12 DIAGNOSIS — J189 Pneumonia, unspecified organism: Secondary | ICD-10-CM | POA: Diagnosis not present

## 2020-11-12 DIAGNOSIS — J449 Chronic obstructive pulmonary disease, unspecified: Secondary | ICD-10-CM | POA: Diagnosis not present

## 2020-11-12 DIAGNOSIS — I5032 Chronic diastolic (congestive) heart failure: Secondary | ICD-10-CM | POA: Diagnosis not present

## 2020-11-12 DIAGNOSIS — I714 Abdominal aortic aneurysm, without rupture: Secondary | ICD-10-CM | POA: Diagnosis not present

## 2020-11-12 DIAGNOSIS — J90 Pleural effusion, not elsewhere classified: Secondary | ICD-10-CM | POA: Diagnosis not present

## 2020-11-12 DIAGNOSIS — J439 Emphysema, unspecified: Secondary | ICD-10-CM | POA: Diagnosis not present

## 2020-11-12 DIAGNOSIS — I110 Hypertensive heart disease with heart failure: Secondary | ICD-10-CM | POA: Diagnosis not present

## 2020-11-12 DIAGNOSIS — I480 Paroxysmal atrial fibrillation: Secondary | ICD-10-CM | POA: Diagnosis not present

## 2020-11-14 ENCOUNTER — Ambulatory Visit: Payer: Medicare HMO | Admitting: Acute Care

## 2020-11-14 ENCOUNTER — Encounter: Payer: Self-pay | Admitting: Acute Care

## 2020-11-14 ENCOUNTER — Other Ambulatory Visit: Payer: Self-pay

## 2020-11-14 VITALS — BP 132/82 | HR 116 | Ht 63.0 in | Wt 101.4 lb

## 2020-11-14 DIAGNOSIS — I5033 Acute on chronic diastolic (congestive) heart failure: Secondary | ICD-10-CM | POA: Diagnosis not present

## 2020-11-14 DIAGNOSIS — J449 Chronic obstructive pulmonary disease, unspecified: Secondary | ICD-10-CM | POA: Diagnosis not present

## 2020-11-14 DIAGNOSIS — I480 Paroxysmal atrial fibrillation: Secondary | ICD-10-CM | POA: Diagnosis not present

## 2020-11-14 DIAGNOSIS — J189 Pneumonia, unspecified organism: Secondary | ICD-10-CM

## 2020-11-14 DIAGNOSIS — J439 Emphysema, unspecified: Secondary | ICD-10-CM | POA: Diagnosis not present

## 2020-11-14 DIAGNOSIS — R911 Solitary pulmonary nodule: Secondary | ICD-10-CM

## 2020-11-14 DIAGNOSIS — R5381 Other malaise: Secondary | ICD-10-CM | POA: Diagnosis not present

## 2020-11-14 DIAGNOSIS — J9611 Chronic respiratory failure with hypoxia: Secondary | ICD-10-CM

## 2020-11-14 NOTE — Patient Instructions (Addendum)
It is good to see you today. We will send a referral for Pulmonary Rehab ( Dr. Lamonte Sakai) Continue Lasix 40 mg once daily. Continue your potassium supplement Continue wearing your oxygen at 3-5 L [place] as needed based on your level of exertion.  Continue your Eliquis as you have been doing.  If breathing gets worse, please call the office so we can evaluate of you need a CXR.  If you have shortness of breath that does not improve with oxygen, seek emergency care.  Continue using your nebulizer treatments twice daily.  Saturation goal is > 90% at all times Plan is for a 01/2021 CT Chest.  Follow up with Dr. Lamonte Sakai 2 months.  Call if you need [place] sooner.  Continue to work on light exercise. Do not over exert yourself.  Please contact office for sooner follow up if symptoms do not improve or worsen or seek emergency care

## 2020-11-14 NOTE — Progress Notes (Addendum)
History of Present Illness Heather [name] is a 87 y.o. female former smoker with a 20 pack year smoking history, quit 1980 with COPD, paroxysmal A. Fib, chronic hypoxic respiratory failure on 3 L of oxygen per minute at home,diastolic congestive heart failure, pulmonary nodule, abdominal aortic aneurysm, hypothyroidism, and hypertension. She is followed by Dr. Lamonte [name].   Pt. Was recently admitted to the hospital with CAP and pleural effusion.   Brief/Interim Summary of Hospital Admission: Admit date: 10/22/2020 Discharge date: 10/28/2020  87 y.o. female with medical history significant of COPD, paroxysmal A. Fib, chronic hypoxic respiratory failure on 3 L of oxygen per minute at home,diastolic congestive heart failure, pulmonary nodule, abdominal aortic aneurysm, hypothyroidism, hypertension presented to the emergency department  with complaints of increased weakness, fatigue, shortness of breath and was diagnosed with community-acquired pneumonia along with right pleural effusion and hypoxia. The effusion was studied under ultrasound and was found to be insufficient volume for thoracentesis.  During the hospitalization, she was treated with IV antibiotics along with IV Solu-Medrol and IV Lasix.  Her overall condition gradually improved.  She was much better at discharge .She was  discharged home on oral prednisone and Lasix.  She  completed antibiotic treatment.She also completed her prednisone    11/14/2020 Pt. Presents for follow up. She has been out of the hospital for 2.5 weeks. She states she has completed the oral prednisone and She states she has been  compliant with her Lasix 40 mg daily. She is also taking potassium supplementation. She is compliant with her Heather [name]  twice daily. She is using her albuterol nebs twice daily . Pt. Wears 3L on concentrator and 5L with pulsed oxygen. She states she has not been well since 08/2020 when she fell. Per her sone she is doing much better now. She does  have pitting edema in her ankles bilaterally. She states her oxygen levels are always above 90. Pt. States her weight is stable. She states she has a good appetite. She eats frequent small meals. She is also using supplemental Ensure.  No fever. She denies any wheezing or discolored secretions. She is interested in seeing if she will qualify for pulmonary rehab. HR was 116 today after ambulating to the exam room. She is very deconditioned. She has home PT which she states has been very helpful. She is making progress daily with how far she can walk. .She does have  accessory muscle use after exertional activities, and speaks with broken sentences. She recovers quickly with rest. Sats never dropped below 90%.   CXR done 1 week ago at [facility] . There is no pneumonia, but stoill some fluid.    Test Results: 10/27/2020 CXR Emphysematous changes in the lungs. 2. The right effusion with underlying opacity is mildly worsened in the interval. 3. Nodular density in the right mid lung laterally is stable.  10/24/2020 [place] Chest Small volume right pleural effusion. No significant fluid collection, or safe trajectory found.   The procedure was aborted.  07/29/2020:CT Chest with Contrast New focal airspace consolidation in the posterior lingula is suspicious for pneumonia. Interval progression of the calcified right middle lobe pulmonary nodule, now measuring up to 3.2 cm. Associated progression of a 2 cm short axis calcified subcarinal lymph node, concerning for metastatic disease. Asymmetric elevation right hemidiaphragm with adjacent collapse/consolidative opacity in the right lower lobe with evidence of right lower lobe airway impaction. Aspiration would be a consideration. Aortic Atherosclerosis (ICD10-I70.0) and Emphysema (ICD10-J43.9). CBC Latest Ref Rng &  Units 10/23/2020 10/22/2020 08/01/2020  WBC 4.0 - 10.5 K/uL 10.6(H) 11.1(H) 12.5(H)  Hemoglobin 12.0 - 15.0 g/dL 14.3 15.3(H) 12.4   Hematocrit 36.0 - 46.0 % 45.0 45.2 37.2  Platelets 150 - 400 K/uL 287 330 328    BMP Latest Ref Rng & Units 10/27/2020 10/26/2020 10/25/2020  Glucose 70 - 99 mg/dL 194(H) 232(H) 258(H)  BUN 8 - 23 mg/dL 22 20 16   Creatinine 0.44 - 1.00 mg/dL 0.65 0.63 0.64  BUN/Creat Ratio 12 - 28 - - -  Sodium 135 - 145 mmol/L 136 133(L) 137  Potassium 3.5 - 5.1 mmol/L 4.4 4.4 2.9(L)  Chloride 98 - 111 mmol/L 93(L) 94(L) 96(L)  CO2 22 - 32 mmol/L 35(H) 32 33(H)  Calcium 8.9 - 10.3 mg/dL 8.7(L) 8.9 8.4(L)    BNP    Component Value Date/Time   BNP 62.2 10/22/2020 1150    ProBNP    Component Value Date/Time   PROBNP 121.6 02/20/2014 1435    PFT    Component Value Date/Time   FEV1PRE 1.00 05/31/2013 1300   FEV1POST 1.08 05/31/2013 1300   FVCPRE 2.10 05/31/2013 1300   FVCPOST 2.15 05/31/2013 1300   DLCOUNC 15.19 05/31/2013 1300   PREFEV1FVCRT 48 05/31/2013 1300   PSTFEV1FVCRT 50 05/31/2013 1300    DG Chest 2 View  Result Date: 10/27/2020 CLINICAL DATA:  Pneumonia. Nausea, fatigue, and headache. Shortness of breath. Dyspnea. EXAM: CHEST - 2 VIEW COMPARISON:  October 22, 2020 FINDINGS: The cardiomediastinal silhouette is stable. A right-sided pleural effusion with underlying opacity is mildly worsened in the interval. Emphysematous changes are seen in the lungs. A stable nodular density projects over the right lateral mid lung, stable. No pneumothorax. No acute interval change. IMPRESSION: 1. Emphysematous changes in the lungs. 2. The right effusion with underlying opacity is mildly worsened in the interval. 3. Nodular density in the right mid lung laterally is stable. Electronically Signed   By: Dorise Bullion III M.D   On: 10/27/2020 12:34   DG Chest 2 View  Result Date: 10/22/2020 CLINICAL DATA:  Shortness of breath and chest tightness. Status post treatment for pneumonia. EXAM: CHEST - 2 VIEW COMPARISON:  02/27/2020 FINDINGS: Stable cardiomediastinal contours. Aortic atherosclerotic  calcifications. There is a small to moderate right pleural effusion. Lungs are hyperinflated and there are coarsened interstitial markings compatible with emphysema. Bones appear diffusely osteopenic. Multi level compression fractures are identified throughout the thoracic spine, similar to previous exam. IMPRESSION: 1. Small to moderate right pleural effusion. 2. Emphysema. Electronically Signed   By: Kerby Moors M.D.   On: 10/22/2020 12:30   IR US CHEST  Result Date: 10/24/2020 CLINICAL DATA:  87 year old female with CHF, presented with shortness of breath. CT chest with a small volume right pleural effusion, the IR consulted for possible thoracentesis EXAM: LIMITED ULTRASOUND OF THE CHEST TECHNIQUE: A limited preprocedure ultrasound was performed of the right chest, prior to anticipated thoracentesis procedure. COMPARISON:  CT chest, 07/29/2020.  Chest radiograph, 09/22/2020. FINDINGS: Small volume right pleural effusion. No significant fluid collection, or safe trajectory found. The procedure was aborted. IMPRESSION: 1. Small volume right pleural effusion. 2. Thoracentesis procedure was not performed. The patient's providers were informed of the findings. 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    Mild aortic insufficiency    Osteoporosis    Peripheral neuropathy    Pneumonia    states she had it twice, last time a couple of months ago   PONV (postoperative nausea  and vomiting)    Shortness of breath    with exertion   Unspecified hypothyroidism    Venous insufficiency      Social History   Tobacco Use   Smoking status: Former    Packs/day: 1.00    Years: 20.00    Pack years: 20.00    Types: Cigarettes    Start date: 87    Quit date: 09/12/1978    Years since quitting: 42.2   Smokeless tobacco: Never  Vaping Use   Vaping Use: Never used  Substance Use Topics   Alcohol use: Not Currently    Alcohol/week: 0.0 standard drinks    Comment: occ   Drug use: No    Ms.Towe reports that she quit smoking about 42 years ago. Her smoking use included cigarettes. She started smoking about 62 years ago. She has a 20.00 pack-year smoking history. She has never used smokeless tobacco. She reports that she does not currently use alcohol. She reports that she does not use drugs.  Tobacco Cessation: Former smoker    Past surgical hx, Family hx, Social hx all reviewed.  Current Outpatient Medications on File Prior to Visit  Medication Sig   albuterol (PROVENTIL) (2.5 MG/3ML) 0.083% nebulizer solution Take 2.5 mg by nebulization 2 (two) times daily as needed for wheezing or shortness of breath.   albuterol (VENTOLIN HFA) 108 (90 Base) MCG/ACT inhaler Inhale 2 puffs into the lungs every 4 (four) hours as needed for wheezing or shortness of breath.   apixaban (ELIQUIS) 2.5 MG TABS tablet Take 1 tablet (2.5 mg total) by mouth 2 (two) times daily.   Budeson-Glycopyrrol-Formoterol (BREZTRI AEROSPHERE) 160-9-4.8 MCG/ACT AERO Take 2 puffs by mouth 2 (two) times daily.   calcium citrate-vitamin D (CITRACAL+D) 315-200 MG-UNIT per tablet Take 1 tablet by mouth daily.   Carboxymethylcellulose Sodium (THERATEARS OP) Place 2 drops into both eyes daily.    diltiazem (CARDIZEM CD) 180 MG 24 hr capsule Take 180 mg by mouth daily.    feeding supplement, ENSURE ENLIVE, (ENSURE ENLIVE) LIQD Take 237 mLs by mouth 2 (two) times daily between meals.   fluticasone  (FLONASE) 50 MCG/ACT nasal spray USE 2 SPRAY(S) IN EACH NOSTRIL ONCE DAILY IN THE EVENING   furosemide (LASIX) 40 MG tablet Take 40 mg by mouth daily.   guaiFENesin-dextromethorphan (ROBITUSSIN DM) 100-10 MG/5ML syrup Take 10 mLs by mouth every 6 (six) hours as needed for cough.   Lactobacillus Reuteri (BIOGAIA PROBIOTIC PO) Take 1 capsule by mouth daily.   levothyroxine (SYNTHROID, LEVOTHROID) 137 MCG tablet Take 137 mcg by mouth daily before breakfast.    magnesium oxide (MAG-OX) 400 MG tablet Take 400 mg by mouth daily.   Multiple Vitamins-Minerals (MULTIVITAMIN WITH MINERALS) tablet Take 1 tablet by mouth daily.   polyethylene glycol (MIRALAX / GLYCOLAX) packet Take 17 g by mouth daily.   potassium chloride SA (KLOR-CON) 20 MEQ tablet Take 20  mEq by mouth daily.   No current facility-administered medications on file prior to visit.     Allergies  Allergen Reactions   Sulfa Antibiotics     Other reaction(s): nausea & vomiting   Sulfamethoxazole-Trimethoprim     Other reaction(s): Unknown   Ace Inhibitors Cough   Elemental Sulfur Nausea And Vomiting    Review Of Systems:  Constitutional:   No  weight loss, night sweats,  Fevers, chills, fatigue, or  lassitude.  HEENT:   No headaches,  Difficulty swallowing,  Tooth/dental problems, or  Sore throat,                No sneezing, itching, ear ache, nasal congestion, post nasal drip,   CV:  No chest pain,  Orthopnea, PND, swelling in lower extremities, anasarca, dizziness, palpitations, syncope.   GI  No heartburn, indigestion, abdominal pain, nausea, vomiting, diarrhea, change in bowel habits, loss of appetite, bloody stools.   Resp: No shortness of breath with exertion or at rest.  No excess mucus, no productive cough,  No non-productive cough,  No coughing up of blood.  No change in color of mucus.  No wheezing.  No chest wall deformity  Skin: no rash or lesions.  GU: no dysuria, change in color of urine, no urgency or frequency.   No flank pain, no hematuria   MS:  No joint pain or swelling.  No decreased range of motion.  No back pain.  Psych:  No change in mood or affect. No depression or anxiety.  No memory loss.   Vital Signs BP 132/82 (BP Location: Left Arm, Cuff Size: Normal)   Pulse (!) 116   Ht 5\' 3"  (1.6 m)   Wt 101 lb 6.4 oz (46 kg)   SpO2 94%   BMI 17.96 kg/m    Physical Exam:  General- No distress,  A&Ox3 ENT: No sinus tenderness, TM clear, pale nasal mucosa, no oral exudate,no post nasal drip, no LAN Cardiac: S1, S2, regular rate and rhythm, no murmur Chest: No wheeze/ rales/ dullness; no accessory muscle use, no nasal flaring, no sternal retractions Abd.: Soft Non-tender Ext: No clubbing cyanosis, edema Neuro:  normal strength Skin: No rashes, warm and dry Psych: normal mood and behavior   Assessment/Plan  Acute on Chronic Respiratory Failure 2/2 CAP CAP with Right pleural effusion Possible COPD exacerbation requiring hospitalization Completed discharge prednisone taper Home oxygen 3-5 LPM Pulmonary Nodule currently being monitored by Dr. Lamonte [name] Plan We will send a referral for Pulmonary Rehab ( Dr. Lamonte [name]) Continue Lasix 40 mg once daily. Continue your potassium supplement Continue wearing your oxygen at 3-5 L  as needed based on your level of exertion.  f breathing gets worse, please call the office so we can evaluate of you need a CXR.  If you have shortness of breath that does not improve with oxygen, seek emergency care.  Continue using your nebulizer treatments twice daily.  Saturation goal is > 90% at all times Referral to pulmonary rehab ( under Dr. Lamonte [name]) Plan is for a 01/2021 CT Chest.to follow lung nodule  Follow up with Dr. Lamonte [name] 2 months.  Call if you need [place] sooner.   Acute on Chronic Diastolic HF Right pleural effusion  EF 60% on most recent echo Plan Lasix 40 mg daily Potassium supplementation as you have been doing  PAF Plan Cardizem Eliquis Follow  up with cardiology  Physical deconditioning after fall and hospitalization Plan Continue home PT Referral to Pulmonary Rehab  Weight  loss Good appetite Eats small frequent meals Using Ensure once daily Plan Continue Ensure We will trend weight at follow up  Magdalen Spatz, NP 11/14/2020  5:04 PM

## 2020-11-16 ENCOUNTER — Other Ambulatory Visit (INDEPENDENT_AMBULATORY_CARE_PROVIDER_SITE_OTHER): Payer: Self-pay | Admitting: Otolaryngology

## 2020-11-16 DIAGNOSIS — J439 Emphysema, unspecified: Secondary | ICD-10-CM | POA: Diagnosis not present

## 2020-11-16 DIAGNOSIS — J189 Pneumonia, unspecified organism: Secondary | ICD-10-CM | POA: Diagnosis not present

## 2020-11-16 DIAGNOSIS — A419 Sepsis, unspecified organism: Secondary | ICD-10-CM | POA: Diagnosis not present

## 2020-11-16 DIAGNOSIS — J90 Pleural effusion, not elsewhere classified: Secondary | ICD-10-CM | POA: Diagnosis not present

## 2020-11-16 DIAGNOSIS — I480 Paroxysmal atrial fibrillation: Secondary | ICD-10-CM | POA: Diagnosis not present

## 2020-11-16 DIAGNOSIS — I714 Abdominal aortic aneurysm, without rupture: Secondary | ICD-10-CM | POA: Diagnosis not present

## 2020-11-16 DIAGNOSIS — J9621 Acute and chronic respiratory failure with hypoxia: Secondary | ICD-10-CM | POA: Diagnosis not present

## 2020-11-16 DIAGNOSIS — I110 Hypertensive heart disease with heart failure: Secondary | ICD-10-CM | POA: Diagnosis not present

## 2020-11-16 DIAGNOSIS — I5032 Chronic diastolic (congestive) heart failure: Secondary | ICD-10-CM | POA: Diagnosis not present

## 2020-11-16 MED ORDER — FLUTICASONE PROPIONATE 50 MCG/ACT NA SUSP: 2.0000 | Freq: Every day | NASAL | 6 refills | Status: AC

## 2020-11-19 DIAGNOSIS — I480 Paroxysmal atrial fibrillation: Secondary | ICD-10-CM | POA: Diagnosis not present

## 2020-11-19 DIAGNOSIS — I5032 Chronic diastolic (congestive) heart failure: Secondary | ICD-10-CM | POA: Diagnosis not present

## 2020-11-19 DIAGNOSIS — J439 Emphysema, unspecified: Secondary | ICD-10-CM | POA: Diagnosis not present

## 2020-11-19 DIAGNOSIS — J90 Pleural effusion, not elsewhere classified: Secondary | ICD-10-CM | POA: Diagnosis not present

## 2020-11-19 DIAGNOSIS — I110 Hypertensive heart disease with heart failure: Secondary | ICD-10-CM | POA: Diagnosis not present

## 2020-11-19 DIAGNOSIS — I714 Abdominal aortic aneurysm, without rupture: Secondary | ICD-10-CM | POA: Diagnosis not present

## 2020-11-19 DIAGNOSIS — J189 Pneumonia, unspecified organism: Secondary | ICD-10-CM | POA: Diagnosis not present

## 2020-11-19 DIAGNOSIS — J9621 Acute and chronic respiratory failure with hypoxia: Secondary | ICD-10-CM | POA: Diagnosis not present

## 2020-11-19 DIAGNOSIS — A419 Sepsis, unspecified organism: Secondary | ICD-10-CM | POA: Diagnosis not present

## 2020-11-20 ENCOUNTER — Telehealth: Payer: Self-pay | Admitting: Internal Medicine

## 2020-11-20 NOTE — Telephone Encounter (Signed)
Patient c/o Palpitations:  High priority if patient c/o lightheadedness, shortness of breath, or chest pain  How long have you had palpitations/irregular HR/ Afib? Are you having the symptoms now? 4-5 days, yes  Are you currently experiencing lightheadedness, SOB or CP? Chest pain off and on, but states this is normal for her and her PCP has already checked her for it  Do you have a history of afib (atrial fibrillation) or irregular heart rhythm? yes  Have you checked your BP or HR? (document readings if available): BP has been normal, HR over 100 consistently  Are you experiencing any other symptoms? No   Patient's daughter states the patient has been in afib for 4-5 days and her HR has stayed above 100. She states she also has COPD and gets chest tightness, but this is normal for her and her PCP has already checked her. She states she is not getting any chest tightness now. She states it has not changed since her afib has started. She states she is not sure if the patient's afib has started due to a lot of extra medications. She says she was also in the hospital the last week of July.

## 2020-11-20 NOTE — Telephone Encounter (Signed)
Spoke with pt's daughter and pt's therapist noted pt's HR running consistently high around 100 and thought should notify cardiologists Pt is asymptomatic and per daughter pt had been on a higher dose of Prednisone and now is back on 10 mg Pt has been taking Diltiazem 180 mg and is on Eliquis Will forward to Dr Debara Pickett for review .Adonis Housekeeper

## 2020-11-21 DIAGNOSIS — A419 Sepsis, unspecified organism: Secondary | ICD-10-CM | POA: Diagnosis not present

## 2020-11-21 DIAGNOSIS — I5032 Chronic diastolic (congestive) heart failure: Secondary | ICD-10-CM | POA: Diagnosis not present

## 2020-11-21 DIAGNOSIS — I110 Hypertensive heart disease with heart failure: Secondary | ICD-10-CM | POA: Diagnosis not present

## 2020-11-21 DIAGNOSIS — J9621 Acute and chronic respiratory failure with hypoxia: Secondary | ICD-10-CM | POA: Diagnosis not present

## 2020-11-21 DIAGNOSIS — J189 Pneumonia, unspecified organism: Secondary | ICD-10-CM | POA: Diagnosis not present

## 2020-11-21 DIAGNOSIS — J90 Pleural effusion, not elsewhere classified: Secondary | ICD-10-CM | POA: Diagnosis not present

## 2020-11-21 DIAGNOSIS — J439 Emphysema, unspecified: Secondary | ICD-10-CM | POA: Diagnosis not present

## 2020-11-21 DIAGNOSIS — I714 Abdominal aortic aneurysm, without rupture: Secondary | ICD-10-CM | POA: Diagnosis not present

## 2020-11-21 DIAGNOSIS — I480 Paroxysmal atrial fibrillation: Secondary | ICD-10-CM | POA: Diagnosis not present

## 2020-11-21 NOTE — Telephone Encounter (Signed)
She is in longstanding persistent if not permanent afib - I suspect her lung issues or the steroid burst she got has caused her afib to be faster than usual.  If it does not improve over the next week, we may have to see her and consider increasing her rate-control meds.  Dr. Lemmie Evens

## 2020-11-22 NOTE — Telephone Encounter (Signed)
Pt's daughter aware of recommendations and agrees with plan ./cy

## 2020-11-26 DIAGNOSIS — J9621 Acute and chronic respiratory failure with hypoxia: Secondary | ICD-10-CM | POA: Diagnosis not present

## 2020-11-26 DIAGNOSIS — I714 Abdominal aortic aneurysm, without rupture: Secondary | ICD-10-CM | POA: Diagnosis not present

## 2020-11-26 DIAGNOSIS — J439 Emphysema, unspecified: Secondary | ICD-10-CM | POA: Diagnosis not present

## 2020-11-26 DIAGNOSIS — J189 Pneumonia, unspecified organism: Secondary | ICD-10-CM | POA: Diagnosis not present

## 2020-11-26 DIAGNOSIS — I5032 Chronic diastolic (congestive) heart failure: Secondary | ICD-10-CM | POA: Diagnosis not present

## 2020-11-26 DIAGNOSIS — I480 Paroxysmal atrial fibrillation: Secondary | ICD-10-CM | POA: Diagnosis not present

## 2020-11-26 DIAGNOSIS — I110 Hypertensive heart disease with heart failure: Secondary | ICD-10-CM | POA: Diagnosis not present

## 2020-11-26 DIAGNOSIS — J90 Pleural effusion, not elsewhere classified: Secondary | ICD-10-CM | POA: Diagnosis not present

## 2020-11-26 DIAGNOSIS — A419 Sepsis, unspecified organism: Secondary | ICD-10-CM | POA: Diagnosis not present

## 2020-11-27 NOTE — Telephone Encounter (Signed)
STAT if HR is under 50 or over 120 (normal HR is 60-100 beats per minute)  What is your heart rate? 93 (but fluctuating in the mid 90s even while on the phone)   Do you have a log of your heart rate readings (document readings)?  11/20/20: 107 11/21/20: 113 11/22/20: 112 11/23/20: 104 11/24/20: 108 11/25/20: 91, 108 11/26/20: 110, 118 11/27/20: 110 , 103  Do you have any other symptoms? More chest pain than normal, but daughter said it is more common when she is in afib  Pt c/o of Chest Pain: STAT if CP now or developed within 24 hours  1. Are you having CP right now? no  2. Are you experiencing any other symptoms (ex. SOB, nausea, vomiting, sweating)? No.  Pt has COPD so she does have breathing issues, but per the daughter nothing is acute   3. How long have you been experiencing CP? A while   4. Is your CP continuous or coming and going? Comes and goes   5. Have you taken Nitroglycerin? no ?

## 2020-11-27 NOTE — Telephone Encounter (Signed)
Patient is scheduled to see Roby Lofts, PA tomorrow, 11/28/20 at 2:15 pm

## 2020-11-28 ENCOUNTER — Ambulatory Visit: Payer: Medicare HMO | Admitting: Medical

## 2020-11-28 ENCOUNTER — Encounter: Payer: Self-pay | Admitting: Medical

## 2020-11-28 VITALS — BP 112/60 | HR 98 | Ht 63.0 in | Wt 104.8 lb

## 2020-11-28 DIAGNOSIS — M810 Age-related osteoporosis without current pathological fracture: Secondary | ICD-10-CM | POA: Diagnosis not present

## 2020-11-28 DIAGNOSIS — I5032 Chronic diastolic (congestive) heart failure: Secondary | ICD-10-CM | POA: Diagnosis not present

## 2020-11-28 DIAGNOSIS — I4819 Other persistent atrial fibrillation: Secondary | ICD-10-CM

## 2020-11-28 DIAGNOSIS — I110 Hypertensive heart disease with heart failure: Secondary | ICD-10-CM | POA: Diagnosis not present

## 2020-11-28 MED ORDER — POTASSIUM CHLORIDE CRYS ER 20 MEQ PO TBCR: 20.0000 meq | EXTENDED_RELEASE_TABLET | Freq: Every day | ORAL | 6 refills | Status: AC

## 2020-11-28 MED ORDER — FUROSEMIDE 40 MG PO TABS: 40.0000 mg | ORAL_TABLET | Freq: Every day | ORAL | 6 refills | Status: AC

## 2020-11-28 NOTE — Patient Instructions (Addendum)
Medication Instructions:  FUROSEMIDE May take extra 20mg (60mg  am) for weight gain of 3#/overnight or 5#/weekly  POTASSIUM May take extra 20mg  (am) for weight gain 3#/daily or 5#/weekly  *If you need a refill on your cardiac medications before your next appointment, please call your pharmacy*  Lab Work:   Testing/Procedures:  NONE    NONE  Special Instructions PLEASE READ AND FOLLOW SALTY 6-ATTACHED-1,800mg  daily    Follow-Up: Your next appointment:  KEEP SCHEDULED APPOINTMENT   In Person with K. Mali Hilty, MD   At [facility], you and your health needs are our priority.  As part of our continuing mission to provide you with exceptional heart care, we have created designated Provider Care Teams.  These Care Teams include your primary Cardiologist (physician) and Advanced Practice Providers (APPs -  Physician Assistants and Nurse Practitioners) who all work together to provide you with the care you need, when you need it.            6 SALTY THINGS TO AVOID     1,800MG  DAILY

## 2020-11-28 NOTE — Progress Notes (Signed)
Cardiology Office Note   Date:  12/02/2020   ID:  Heather [name], Heather [name] 04/29/1926, MRN [id number]  PCP:  Burnard Bunting, MD  Cardiologist:  Pixie Casino, MD EP: None  Chief Complaint  Patient presents with   Follow-up    CHF, Afib, recent hospitalization       History of Present Illness: Heather [name] is a 87 y.o. female with a PMH of paroxysmal atrial fibrillation, chronic diastolic CHF, HTN, HLD, COPD on home O2, hypothyroidism, who presents for post-hospital follow-up.  She was last evaluated by cardiology at an outpatient visit with Dr. Debara Pickett 02/2020, following a recent hospitalization for COPD exacerbation. Repeat echocardiogram during that admission showed normal LV function without significant valvular disease. No medication changes occurred and she was recommended to follow-up in 1 year. Since that time she has been admitted to the hospital twice - 07/2020 for sepsis in the setting of PNA and COPD exacerbation, and again 09/2020 for acute on chronic respiratory failure, PNA, and pleural effusion with failed tap by IR. She was discharge home on po lasix 57m daily for volume management. Echo 01/2020 showed EF 65-70%, mild LVH, indeterminate LV diastolic function, normal RV size/function, mildly elevated PA pressures, moderate MAC and mild-moderate aortic valve sclerosis without stenosis.   She presents today with her daughter. Overall she seems stable following discharge last month. She slept without oxygen last night for the first time since discharge since her breathing felt improved. She still has quite a bit of DOE but recovers quickly with rest. She has occasional chest tightness which is chronic and unchanged. She has LE edema which is slightly worse today as her legs have been down most of the afternoon. Swelling improves with LE elevation. Weights have steadily increased over the past couple weeks from 100lbs to 104lbs but no weight gain of 3lbs overnight or 5lbs in 1  week. We discussed using additional lasix for those circumstances going forward. She does describe some occasional lightheadedness with position changes but thankfully no falls since June 2022. BP has been running in the 110s-120s/60s. They are also concerned about elevated Hrs in the 90s-100s recently. She is unaware of any palpitations. Blood pressure limits further rate control strategies.     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    Mild aortic insufficiency    Osteoporosis    Peripheral neuropathy    Pneumonia    states she had it twice, last time a couple of months ago   PONV (postoperative nausea and vomiting)    Shortness of breath    with exertion   Unspecified hypothyroidism    Venous insufficiency     Past Surgical History:  Procedure Laterality Date   APPENDECTOMY  1935   BACK SURGERY     x2   Cardiometablic Testing  24/58/5929  submaximal effort with peak RER of 0.5, peak VO2 79% predicted; HR peak up to 78%; PVC was 55% predicted, PEV1 39% predicted; PEV1/VC ratio was reduced;  normal vital capacity; DLCO was reduced to 63%   CARPAL TUNNEL RELEASE     CATARACT EXTRACTION W/ INTRAOCULAR LENS  IMPLANT, BILATERAL     CHOLECYSTECTOMY  04/07/2014   dr Marlou Starks   CHOLECYSTECTOMY N/A 04/07/2014   Procedure: LAPAROSCOPIC CHOLECYSTECTOMY WITH INTRAOPERATIVE CHOLANGIOGRAM POSSBILE OPEN;  Surgeon: Autumn Messing III, MD;  Location: [facility];  Service: General;  Laterality: N/A;   COLON SURGERY  2008   partial   ESOPHAGOGASTRODUODENOSCOPY (EGD) WITH  PROPOFOL N/A 05/25/2017   Procedure: ESOPHAGOGASTRODUODENOSCOPY (EGD) WITH PROPOFOL;  Surgeon: Clarene Essex, MD;  Location: [facility];  Service: Endoscopy;  Laterality: N/A;   HERNIA REPAIR     JOINT REPLACEMENT     left hip relacement  2000   SAVORY DILATION N/A 05/25/2017   Procedure: SAVORY DILATION;  Surgeon: Clarene Essex, MD;  Location: [facility];  Service: Endoscopy;  Laterality: N/A;   TONSILLECTOMY  1935   TOTAL ABDOMINAL HYSTERECTOMY  1970   TRANSTHORACIC ECHOCARDIOGRAM  05/2011   EF=>55%; mild conc LVH; mild mitral annular calcif; mild TR; AV mildly sclerotic & mild AR   VENTRAL HERNIA REPAIR  09/19/2011   Procedure: LAPAROSCOPIC VENTRAL HERNIA;  Surgeon: Luella Cook III, MD;  Location: [facility];  Service: General;  Laterality: N/A;  laparoscopic ventral hernia repair with mesh     Current Outpatient Medications  Medication Sig Dispense Refill   albuterol (PROVENTIL) (2.5 MG/3ML) 0.083% nebulizer solution Take 2.5 mg by nebulization 2 (two) times daily as needed for wheezing or shortness of breath.     albuterol (VENTOLIN HFA) 108 (90 Base) MCG/ACT inhaler Inhale 2 puffs into the lungs every 4 (four) hours as needed for wheezing or shortness of breath. 18 g 5   apixaban (ELIQUIS) 2.5 MG TABS tablet Take 1 tablet (2.5 mg total) by mouth 2 (two) times daily. 180 tablet 1   Budeson-Glycopyrrol-Formoterol (BREZTRI AEROSPHERE) 160-9-4.8 MCG/ACT AERO Take 2 puffs by mouth 2 (two) times daily.     calcium citrate-vitamin D (CITRACAL+D) 315-200 MG-UNIT per tablet Take 1 tablet by mouth daily.     Carboxymethylcellulose Sodium (THERATEARS OP) Place 2 drops into both eyes daily.      diltiazem (CARDIZEM CD) 180 MG 24 hr capsule Take 180 mg by mouth daily.      feeding supplement, ENSURE ENLIVE, (ENSURE ENLIVE) LIQD Take 237 mLs by mouth 2 (two) times daily between meals. 237 mL 12   fluticasone (FLONASE) 50 MCG/ACT nasal spray Place 2 sprays into both nostrils daily. 16 g 6    guaiFENesin-dextromethorphan (ROBITUSSIN DM) 100-10 MG/5ML syrup Take 10 mLs by mouth every 6 (six) hours as needed for cough. 118 mL 1   Lactobacillus Reuteri (BIOGAIA PROBIOTIC PO) Take 1 capsule by mouth daily.     levothyroxine (SYNTHROID, LEVOTHROID) 137 MCG tablet Take 137 mcg by mouth daily before breakfast.      magnesium oxide (MAG-OX) 400 MG tablet Take 400 mg by mouth daily.     Multiple Vitamins-Minerals (MULTIVITAMIN WITH MINERALS) tablet Take 1 tablet by mouth daily.     polyethylene glycol (MIRALAX / GLYCOLAX) packet Take 17 g by mouth daily.     furosemide (LASIX) 40 MG tablet Take 1 tablet (40 mg total) by mouth daily. May take extra 58m(60mg am) for weight gain of 3#/overnight or 5#/weekly 40 tablet 6   potassium chloride SA (KLOR-CON) 20 MEQ tablet Take 1 tablet (20 mEq total) by mouth daily. May take extra 271m(am) for weight gain 3#/daily or 5#/weekly 40  tablet 6   No current facility-administered medications for this visit.    Allergies:   Sulfa antibiotics, Sulfamethoxazole-trimethoprim, Ace inhibitors, and Elemental sulfur    Social History:  The patient  reports that she quit smoking about 42 years ago. Her smoking use included cigarettes. She started smoking about 62 years ago. She has a 20.00 pack-year smoking history. She has never used smokeless tobacco. She reports that she does not currently use alcohol. She reports that she does not use drugs.   Family History:  The patient's family history includes Bladder Cancer in her brother; Diabetes in her brother; Heart block in her brother; Stroke in her brother, father, and mother.    ROS:  Please see the history of present illness.   Otherwise, review of systems are positive for none.   All other systems are reviewed and negative.    PHYSICAL EXAM: VS:  BP 112/60 (BP Location: Right Arm, Patient Position: Sitting, Cuff Size: Normal)   Pulse 98   Ht _0  (1.6 m)   Wt 104 lb 12.8 oz (47.5 kg)   SpO2 97%   BMI  18.56 kg/m  , BMI Body mass index is 18.56 kg/m. GEN: Frail elderly female sitting in chair in no acute distress HEENT: sclera anicteric Neck: no JVD, carotid bruits, or masses Cardiac: RRR; no murmurs, rubs, or gallops, 2+ LE edema to knees - compression stockings in place. Respiratory:  clear to auscultation bilaterally, mildly conversationally dyspneic, O2 via [place] in place GI: soft, nontender, nondistended, + BS MS: no deformity or atrophy Skin: warm and dry, no rash Neuro:  Strength and sensation are intact Psych: euthymic mood, full affect   EKG:  EKG is ordered today. The ekg ordered today demonstrates sinus rhythm, rate 98 bpm, PACs/PVCs, no STE/D, poor baseline quality, though patient was unable to lay down for EKG.    Recent Labs: 10/22/2020: ALT 18; B Natriuretic Peptide 62.2 10/23/2020: Hemoglobin 14.3; Platelets 287 10/27/2020: BUN 22; Creatinine, Ser 0.65; Magnesium 2.3; Potassium 4.4; Sodium 136    Lipid Panel No results found for: CHOL, TRIG, HDL, CHOLHDL, VLDL, LDLCALC, LDLDIRECT    Wt Readings from Last 3 Encounters:  11/28/20 104 lb 12.8 oz (47.5 kg)  11/14/20 101 lb 6.4 oz (46 kg)  10/27/20 101 lb 3.1 oz (45.9 kg)      Other studies Reviewed: Additional studies/ records that were reviewed today include:   Echocardiogram 01/2020  1. Left ventricular ejection fraction, by estimation, is 65 to 70%. The left ventricle has hyperdynamic function. The left ventricle has no  regional wall motion abnormalities. There is mild left ventricular  hypertrophy. Left ventricular diastolic  function could not be evaluated.   2. Right ventricular systolic function is normal. The right ventricular  size is normal. There is mildly elevated pulmonary artery systolic  pressure.   3. The mitral valve is grossly normal. Trivial mitral valve  regurgitation. Moderate mitral annular calcification.   4. The aortic valve is tricuspid. There is moderate calcification of the   aortic valve. There is moderate thickening of the aortic valve. Aortic  valve regurgitation is trivial. Mild to moderate aortic valve  sclerosis/calcification is present, without any   evidence of aortic stenosis.   Comparison(s): No significant change from prior study.     ASSESSMENT AND PLAN:  1. Chronic diastolic CHF: weights have been generally stable over the past few weeks. She does have 2+ LE edema and has been wearing her compression stockings. Lungs are clear  on exam and she feels breathing continues to improve from hospitalization last month. Soft blood pressure limits aggressive titration of cardiac medications.  - Continue lasix 83m daily - we discussed taking addition 258mof lasix for weight gain of 3lbs overnight or 5lbs in 1 week. She will continue potassium 20 mEq daily with plans to take additional if extra lasix is taken.  - Encouraged ongoing daily monitoring of weights and low sodium diet - Not on Bblocker given underlying pulmonary disease  2. Paroxysmal atrial fibrillation: EKG today with sinus tachycardia. No complaints of palpitations. BP limits titration of meds for better HR control - Continue diltiazem for rate control - Continue eliquis for stroke ppx - dose adjusted for age and weight  3. HTN: BP 112/60 today. Does describe some mild orthostatic symptoms - no falls or syncope - Continue diltiazem   4. HLD: LDL has been well controlled - Continue to monitor, though doubtful she would gain any meaningful benefit from statin even if LDL uptrended.   5. COPD: on home O2 at baseline. Breathing improved from discharge though she still gets quite winded with activity. Planning for repeat CT Chest 01/2021 - Continue close follow-up with pulmonology   Current medicines are reviewed at length with the patient today.  The patient does not have concerns regarding medicines.  The following changes have been made:  As above  Labs/ tests ordered today include:     Orders Placed This Encounter  Procedures   EKG 12-Lead     Disposition:   FU with Dr. HiDebara Pickett2/2022  Signed, KrAbigail ButtsPA-C  12/02/2020 11:11 AM

## 2020-11-30 DIAGNOSIS — I110 Hypertensive heart disease with heart failure: Secondary | ICD-10-CM | POA: Diagnosis not present

## 2020-11-30 DIAGNOSIS — J439 Emphysema, unspecified: Secondary | ICD-10-CM | POA: Diagnosis not present

## 2020-11-30 DIAGNOSIS — J9621 Acute and chronic respiratory failure with hypoxia: Secondary | ICD-10-CM | POA: Diagnosis not present

## 2020-11-30 DIAGNOSIS — A419 Sepsis, unspecified organism: Secondary | ICD-10-CM | POA: Diagnosis not present

## 2020-11-30 DIAGNOSIS — I714 Abdominal aortic aneurysm, without rupture: Secondary | ICD-10-CM | POA: Diagnosis not present

## 2020-11-30 DIAGNOSIS — I480 Paroxysmal atrial fibrillation: Secondary | ICD-10-CM | POA: Diagnosis not present

## 2020-11-30 DIAGNOSIS — J189 Pneumonia, unspecified organism: Secondary | ICD-10-CM | POA: Diagnosis not present

## 2020-11-30 DIAGNOSIS — I5032 Chronic diastolic (congestive) heart failure: Secondary | ICD-10-CM | POA: Diagnosis not present

## 2020-11-30 DIAGNOSIS — J90 Pleural effusion, not elsewhere classified: Secondary | ICD-10-CM | POA: Diagnosis not present

## 2020-12-02 ENCOUNTER — Encounter: Payer: Self-pay | Admitting: Medical

## 2020-12-03 DIAGNOSIS — J90 Pleural effusion, not elsewhere classified: Secondary | ICD-10-CM | POA: Diagnosis not present

## 2020-12-03 DIAGNOSIS — I714 Abdominal aortic aneurysm, without rupture, unspecified: Secondary | ICD-10-CM | POA: Diagnosis not present

## 2020-12-03 DIAGNOSIS — I480 Paroxysmal atrial fibrillation: Secondary | ICD-10-CM | POA: Diagnosis not present

## 2020-12-03 DIAGNOSIS — I5032 Chronic diastolic (congestive) heart failure: Secondary | ICD-10-CM | POA: Diagnosis not present

## 2020-12-03 DIAGNOSIS — I110 Hypertensive heart disease with heart failure: Secondary | ICD-10-CM | POA: Diagnosis not present

## 2020-12-03 DIAGNOSIS — J9621 Acute and chronic respiratory failure with hypoxia: Secondary | ICD-10-CM | POA: Diagnosis not present

## 2020-12-03 DIAGNOSIS — J439 Emphysema, unspecified: Secondary | ICD-10-CM | POA: Diagnosis not present

## 2020-12-03 DIAGNOSIS — J189 Pneumonia, unspecified organism: Secondary | ICD-10-CM | POA: Diagnosis not present

## 2020-12-03 DIAGNOSIS — A419 Sepsis, unspecified organism: Secondary | ICD-10-CM | POA: Diagnosis not present

## 2020-12-04 DIAGNOSIS — I480 Paroxysmal atrial fibrillation: Secondary | ICD-10-CM | POA: Diagnosis not present

## 2020-12-04 DIAGNOSIS — I5032 Chronic diastolic (congestive) heart failure: Secondary | ICD-10-CM | POA: Diagnosis not present

## 2020-12-04 DIAGNOSIS — J9611 Chronic respiratory failure with hypoxia: Secondary | ICD-10-CM | POA: Diagnosis not present

## 2020-12-04 DIAGNOSIS — I5033 Acute on chronic diastolic (congestive) heart failure: Secondary | ICD-10-CM | POA: Diagnosis not present

## 2020-12-04 DIAGNOSIS — J449 Chronic obstructive pulmonary disease, unspecified: Secondary | ICD-10-CM | POA: Diagnosis not present

## 2020-12-04 DIAGNOSIS — I10 Essential (primary) hypertension: Secondary | ICD-10-CM | POA: Diagnosis not present

## 2020-12-04 DIAGNOSIS — R739 Hyperglycemia, unspecified: Secondary | ICD-10-CM | POA: Diagnosis not present

## 2020-12-04 DIAGNOSIS — J90 Pleural effusion, not elsewhere classified: Secondary | ICD-10-CM | POA: Diagnosis not present

## 2020-12-04 DIAGNOSIS — D72829 Elevated white blood cell count, unspecified: Secondary | ICD-10-CM | POA: Diagnosis not present

## 2020-12-05 DIAGNOSIS — J90 Pleural effusion, not elsewhere classified: Secondary | ICD-10-CM | POA: Diagnosis not present

## 2020-12-05 DIAGNOSIS — J189 Pneumonia, unspecified organism: Secondary | ICD-10-CM | POA: Diagnosis not present

## 2020-12-05 DIAGNOSIS — I110 Hypertensive heart disease with heart failure: Secondary | ICD-10-CM | POA: Diagnosis not present

## 2020-12-05 DIAGNOSIS — I714 Abdominal aortic aneurysm, without rupture, unspecified: Secondary | ICD-10-CM | POA: Diagnosis not present

## 2020-12-05 DIAGNOSIS — I5032 Chronic diastolic (congestive) heart failure: Secondary | ICD-10-CM | POA: Diagnosis not present

## 2020-12-05 DIAGNOSIS — J449 Chronic obstructive pulmonary disease, unspecified: Secondary | ICD-10-CM | POA: Diagnosis not present

## 2020-12-05 DIAGNOSIS — J439 Emphysema, unspecified: Secondary | ICD-10-CM | POA: Diagnosis not present

## 2020-12-05 DIAGNOSIS — J9621 Acute and chronic respiratory failure with hypoxia: Secondary | ICD-10-CM | POA: Diagnosis not present

## 2020-12-05 DIAGNOSIS — I480 Paroxysmal atrial fibrillation: Secondary | ICD-10-CM | POA: Diagnosis not present

## 2020-12-05 DIAGNOSIS — A419 Sepsis, unspecified organism: Secondary | ICD-10-CM | POA: Diagnosis not present

## 2020-12-12 DIAGNOSIS — U071 COVID-19: Secondary | ICD-10-CM | POA: Diagnosis not present

## 2020-12-12 DIAGNOSIS — J449 Chronic obstructive pulmonary disease, unspecified: Secondary | ICD-10-CM | POA: Diagnosis not present

## 2020-12-14 DIAGNOSIS — I5032 Chronic diastolic (congestive) heart failure: Secondary | ICD-10-CM | POA: Diagnosis not present

## 2020-12-14 DIAGNOSIS — A419 Sepsis, unspecified organism: Secondary | ICD-10-CM | POA: Diagnosis not present

## 2020-12-14 DIAGNOSIS — I714 Abdominal aortic aneurysm, without rupture, unspecified: Secondary | ICD-10-CM | POA: Diagnosis not present

## 2020-12-14 DIAGNOSIS — J189 Pneumonia, unspecified organism: Secondary | ICD-10-CM | POA: Diagnosis not present

## 2020-12-14 DIAGNOSIS — J9621 Acute and chronic respiratory failure with hypoxia: Secondary | ICD-10-CM | POA: Diagnosis not present

## 2020-12-14 DIAGNOSIS — J90 Pleural effusion, not elsewhere classified: Secondary | ICD-10-CM | POA: Diagnosis not present

## 2020-12-14 DIAGNOSIS — I110 Hypertensive heart disease with heart failure: Secondary | ICD-10-CM | POA: Diagnosis not present

## 2020-12-14 DIAGNOSIS — J439 Emphysema, unspecified: Secondary | ICD-10-CM | POA: Diagnosis not present

## 2020-12-14 DIAGNOSIS — I480 Paroxysmal atrial fibrillation: Secondary | ICD-10-CM | POA: Diagnosis not present

## 2020-12-17 DIAGNOSIS — I714 Abdominal aortic aneurysm, without rupture, unspecified: Secondary | ICD-10-CM | POA: Diagnosis not present

## 2020-12-17 DIAGNOSIS — J439 Emphysema, unspecified: Secondary | ICD-10-CM | POA: Diagnosis not present

## 2020-12-17 DIAGNOSIS — J90 Pleural effusion, not elsewhere classified: Secondary | ICD-10-CM | POA: Diagnosis not present

## 2020-12-17 DIAGNOSIS — I110 Hypertensive heart disease with heart failure: Secondary | ICD-10-CM | POA: Diagnosis not present

## 2020-12-17 DIAGNOSIS — I480 Paroxysmal atrial fibrillation: Secondary | ICD-10-CM | POA: Diagnosis not present

## 2020-12-17 DIAGNOSIS — A419 Sepsis, unspecified organism: Secondary | ICD-10-CM | POA: Diagnosis not present

## 2020-12-17 DIAGNOSIS — J189 Pneumonia, unspecified organism: Secondary | ICD-10-CM | POA: Diagnosis not present

## 2020-12-17 DIAGNOSIS — I5032 Chronic diastolic (congestive) heart failure: Secondary | ICD-10-CM | POA: Diagnosis not present

## 2020-12-17 DIAGNOSIS — J9621 Acute and chronic respiratory failure with hypoxia: Secondary | ICD-10-CM | POA: Diagnosis not present

## 2020-12-24 ENCOUNTER — Other Ambulatory Visit: Payer: Self-pay

## 2020-12-24 ENCOUNTER — Ambulatory Visit: Payer: Medicare HMO | Admitting: Podiatry

## 2020-12-24 ENCOUNTER — Encounter: Payer: Self-pay | Admitting: Podiatry

## 2020-12-24 DIAGNOSIS — M79674 Pain in right toe(s): Secondary | ICD-10-CM | POA: Diagnosis not present

## 2020-12-24 DIAGNOSIS — B351 Tinea unguium: Secondary | ICD-10-CM

## 2020-12-24 DIAGNOSIS — M79675 Pain in left toe(s): Secondary | ICD-10-CM

## 2020-12-27 ENCOUNTER — Telehealth: Payer: Self-pay | Admitting: Emergency Medicine

## 2020-12-27 NOTE — Telephone Encounter (Signed)
Primary Pulmonologist: Dr. Lamonte Sakai Last office visit and with whom: 11/14/20 with Eric Form What do we see them for (pulmonary problems): Chronic respiratory failure, COPD, Pulm Emphysema, CAP, Pulm nodule Last OV assessment/plan: see below  Was appointment offered to patient (explain)?    Assessment/Plan   Acute on Chronic Respiratory Failure 2/2 CAP CAP with Right pleural effusion Possible COPD exacerbation requiring hospitalization Completed discharge prednisone taper Home oxygen 3-5 LPM Pulmonary Nodule currently being monitored by Dr. Lamonte Sakai Plan We will send a referral for Pulmonary Rehab ( Dr. Lamonte Sakai) Continue Lasix 40 mg once daily. Continue your potassium supplement Continue wearing your oxygen at 3-5 L [place] as needed based on your level of exertion.  f breathing gets worse, please call the office so we can evaluate of you need a CXR.  If you have shortness of breath that does not improve with oxygen, seek emergency care.  Continue using your nebulizer treatments twice daily.  Saturation goal is > 90% at all times Referral to pulmonary rehab ( under Dr. Lamonte Sakai) Plan is for a 01/2021 CT Chest.to follow lung nodule  Follow up with Dr. Lamonte Sakai 2 months.  Call if you need [place] sooner.    Acute on Chronic Diastolic HF Right pleural effusion  EF 60% on most recent echo Plan Lasix 40 mg daily Potassium supplementation as you have been doing   PAF Plan Cardizem Eliquis Follow up with cardiology   Physical deconditioning after fall and hospitalization Plan Continue home PT Referral to Pulmonary Rehab   Weight loss Good appetite Eats small frequent meals Using Ensure once daily Plan Continue Ensure We will trend weight at follow up   Heather Spatz, NP 11/14/2020  5:04 PM    Reason for call: I called and spoke with daughter [name], who is on DPR, regarding message. [name] stated patient started having symptoms over the weeks. More SOB and occ chest tightness, occ  coughing with occ pink color in mucus. Patient is using Breztri and Ventolin, which helps with symptoms. No other symptoms present. Will route to Dr. Lamonte Sakai for recs.  Dr. Lamonte Sakai, please advise. Thanks!  (examples of things to ask: : When did symptoms start? Fever? Cough? Productive? Color to sputum? More sputum than usual? Wheezing? Have you needed increased oxygen? Are you taking your respiratory medications? What over the counter measures have you tried?)  Allergies  Allergen Reactions   Sulfa Antibiotics     Other reaction(s): nausea & vomiting   Sulfamethoxazole-Trimethoprim     Other reaction(s): Unknown   Ace Inhibitors Cough   Elemental Sulfur Nausea And Vomiting    Immunization History  Administered Date(s) Administered   Fluad Quad(high Dose 65+) 01/26/2020   Influenza Split 06/04/2011, 12/10/2011, 03/31/2012, 12/15/2013   Influenza, High Dose Seasonal PF 11/30/2018   Influenza, Quadrivalent, Recombinant, Inj, Pf 04/15/2018, 12/14/2018   Influenza,inj,Quad PF,6+ Mos 12/15/2013, 12/20/2014   Influenza-Unspecified 12/29/2013, 12/13/2015, 04/09/2017   PPD Test 05/13/2011   Pneumococcal Conjugate-13 06/27/2015   Pneumococcal Polysaccharide-23 04/25/2009, 04/08/2014, 01/26/2020   Tdap 06/04/2011, 06/26/2014   Zoster, Live 06/04/2011

## 2020-12-27 NOTE — Telephone Encounter (Signed)
She needs to come in to be seen by APP if at all possible, needs CXR

## 2020-12-27 NOTE — Telephone Encounter (Signed)
I have called the number listed and was told that I had the wrong number.   Will need to try back to get her an appt with RB.

## 2020-12-28 ENCOUNTER — Encounter: Payer: Self-pay | Admitting: Pulmonary Disease

## 2020-12-28 ENCOUNTER — Other Ambulatory Visit: Payer: Self-pay

## 2020-12-28 ENCOUNTER — Ambulatory Visit: Payer: Medicare HMO | Admitting: Pulmonary Disease

## 2020-12-28 ENCOUNTER — Ambulatory Visit (INDEPENDENT_AMBULATORY_CARE_PROVIDER_SITE_OTHER)
Admission: RE | Admit: 2020-12-28 | Discharge: 2020-12-28 | Disposition: A | Discharge status: Home / Self Care | Payer: Medicare HMO | Source: Ambulatory Visit | Attending: Pulmonary Disease | Admitting: Pulmonary Disease

## 2020-12-28 ENCOUNTER — Encounter: Payer: Self-pay | Admitting: Podiatry

## 2020-12-28 VITALS — BP 120/70 | HR 51 | Ht 63.0 in | Wt 103.4 lb

## 2020-12-28 DIAGNOSIS — R0602 Shortness of breath: Secondary | ICD-10-CM | POA: Diagnosis not present

## 2020-12-28 DIAGNOSIS — R042 Hemoptysis: Secondary | ICD-10-CM

## 2020-12-28 DIAGNOSIS — J439 Emphysema, unspecified: Secondary | ICD-10-CM | POA: Diagnosis not present

## 2020-12-28 DIAGNOSIS — I2781 Cor pulmonale (chronic): Secondary | ICD-10-CM | POA: Insufficient documentation

## 2020-12-28 DIAGNOSIS — M810 Age-related osteoporosis without current pathological fracture: Secondary | ICD-10-CM | POA: Diagnosis not present

## 2020-12-28 DIAGNOSIS — I110 Hypertensive heart disease with heart failure: Secondary | ICD-10-CM | POA: Diagnosis not present

## 2020-12-28 DIAGNOSIS — R911 Solitary pulmonary nodule: Secondary | ICD-10-CM | POA: Diagnosis not present

## 2020-12-28 DIAGNOSIS — I509 Heart failure, unspecified: Secondary | ICD-10-CM | POA: Insufficient documentation

## 2020-12-28 DIAGNOSIS — J9611 Chronic respiratory failure with hypoxia: Secondary | ICD-10-CM

## 2020-12-28 DIAGNOSIS — I700 Atherosclerosis of aorta: Secondary | ICD-10-CM | POA: Insufficient documentation

## 2020-12-28 DIAGNOSIS — J449 Chronic obstructive pulmonary disease, unspecified: Secondary | ICD-10-CM | POA: Diagnosis not present

## 2020-12-28 DIAGNOSIS — I5032 Chronic diastolic (congestive) heart failure: Secondary | ICD-10-CM | POA: Diagnosis not present

## 2020-12-28 DIAGNOSIS — J441 Chronic obstructive pulmonary disease with (acute) exacerbation: Secondary | ICD-10-CM

## 2020-12-28 DIAGNOSIS — W1830XA Fall on same level, unspecified, initial encounter: Secondary | ICD-10-CM | POA: Insufficient documentation

## 2020-12-28 DIAGNOSIS — D72829 Elevated white blood cell count, unspecified: Secondary | ICD-10-CM | POA: Insufficient documentation

## 2020-12-28 DIAGNOSIS — E559 Vitamin D deficiency, unspecified: Secondary | ICD-10-CM | POA: Insufficient documentation

## 2020-12-28 DIAGNOSIS — M8000XA Age-related osteoporosis with current pathological fracture, unspecified site, initial encounter for fracture: Secondary | ICD-10-CM | POA: Insufficient documentation

## 2020-12-28 LAB — CBC WITH DIFFERENTIAL/PLATELET
Absolute Monocytes: 313 cells/uL (ref 200–950)
Basophils Absolute: 46 cells/uL (ref 0–200)
Basophils Relative: 0.4 %
Eosinophils Absolute: 23 cells/uL (ref 15–500)
Eosinophils Relative: 0.2 %
HCT: 39.2 % (ref 35.0–45.0)
Hemoglobin: 13.6 g/dL (ref 11.7–15.5)
Lymphs Abs: 858 cells/uL (ref 850–3900)
MCH: 32.5 pg (ref 27.0–33.0)
MCHC: 34.7 g/dL (ref 32.0–36.0)
MCV: 93.8 fL (ref 80.0–100.0)
MPV: 8.9 fL (ref 7.5–12.5)
Monocytes Relative: 2.7 %
Neutro Abs: 10359 cells/uL — ABNORMAL HIGH (ref 1500–7800)
Neutrophils Relative %: 89.3 %
Platelets: 355 10*3/uL (ref 140–400)
RBC: 4.18 10*6/uL (ref 3.80–5.10)
RDW: 13 % (ref 11.0–15.0)
Total Lymphocyte: 7.4 %
WBC: 11.6 10*3/uL — ABNORMAL HIGH (ref 3.8–10.8)

## 2020-12-28 MED ORDER — DOXYCYCLINE HYCLATE 100 MG PO TABS: 100.0000 mg | ORAL_TABLET | Freq: Two times a day (BID) | ORAL | 0 refills | Status: DC

## 2020-12-28 MED ORDER — PREDNISONE 10 MG PO TABS: ORAL_TABLET | ORAL | 0 refills | Status: DC

## 2020-12-28 NOTE — Progress Notes (Signed)
@Patient  ID: Heather [name], female    DOB: 1926/06/13, 87 y.o.   MRN: [id number]  Chief Complaint  Patient presents with   Follow-up    Patient is having more SOB and occ chest tightness, occ coughing with occ pink/red color in mucus. Wears 2 liters at rest and 5 liters with exertion    Referring provider: Burnard Bunting, MD  HPI:  87 year old female former smoker followed in our office for COPD, SARS-CoV-2 infection requiring hospitalization October/2020, and abnormal CT imaging  Past medical history: A. fib, GERD, hiatal hernia, aortic insufficiency, hypothyroidism, chronic rhinitis  Smoking history: Former smoker.  Quit 1980.  20-pack-year smoking history Maintenance: Breztri Patient of Dr. Lamonte Sakai  12/28/2020  - Visit   87 year old female former smoker fonder office for COPD.  Patient also status post COVID-19 infection in October/2020 requiring hospitalization.  Patient scheduled for a sick visit today.  Patient was last seen in our office in August/2022 by SG NP.  Plan of care at that point time was a referral to pulmonary rehab, continue Lasix, continue potassium, continue oxygen therapy, complete repeat CT scan in November/2022 for follow-up lung nodule, follow-up with Dr. Lamonte Sakai in 2 months.  Patient contacted our office via her daughter reporting that she is having chest tightness, coughing as well as pink-colored mucus.  Patient denies worsened cough or sputum production.  Patient does feel more fatigue as well as worsening shortness of breath.  Patient reports adherence to her maintenance inhalers.  Daughter who is here with patient today confirms that sputum color has changed from a light pink to a darker red.  They deny that this is bloody streaks but that it is bloody sputum completely.  We do not have an image of this and there are no samples present.  We will further assess and review.  Patient remains on Eliquis.  Questionaires / Pulmonary Flowsheets:   ACT:  No  flowsheet data found.  MMRC: mMRC Dyspnea Scale mMRC Score  12/28/2020 3  07/28/2019 3    Epworth:  No flowsheet data found.  Tests:   FENO:  No results found for: NITRICOXIDE  PFT: PFT Results Latest Ref Rng & Units 05/31/2013  FVC-Pre L 2.10  FVC-Predicted Pre % 91  FVC-Post L 2.15  FVC-Predicted Post % 93  Pre FEV1/FVC % % 48  Post FEV1/FCV % % 50  FEV1-Pre L 1.00  FEV1-Predicted Pre % 58  FEV1-Post L 1.08  DLCO uncorrected ml/min/mmHg 15.19  DLCO UNC% % 66  DLVA Predicted % 75    WALK:  SIX MIN WALK 04/26/2019 07/21/2013 04/25/2013  Supplimental Oxygen during Test? (L/min) Yes No No  O2 Flow Rate 2 - -  Type Pulse - -  Tech Comments: Patient was able to complete 1 lap on room air. Towards the beginning of 2nd lap, O2 dropped to 86%, HR 111. She was placed on 2L of O2. She was able to complete 1/4th of a lap before desatting to 86%. O2 increased to 3L. She was able to finish the walk on 3L, O2 was on 92%. - walked stopped after 1 lap @ 185 ft due to increased SOB//lmr    Imaging: No results found.  Lab Results:  CBC    Component Value Date/Time   WBC 10.6 (H) 10/23/2020 0335   RBC 4.46 10/23/2020 0335   HGB 14.3 10/23/2020 0335   HCT 45.0 10/23/2020 0335   PLT 287 10/23/2020 0335   MCV 100.9 (H) 10/23/2020 0335   MCH  32.1 10/23/2020 0335   MCHC 31.8 10/23/2020 0335   RDW 15.0 10/23/2020 0335   LYMPHSABS 2.2 10/22/2020 1150   MONOABS 0.7 10/22/2020 1150   EOSABS 0.0 10/22/2020 1150   BASOSABS 0.1 10/22/2020 1150    BMET    Component Value Date/Time   NA 136 10/27/2020 0122   NA 143 02/12/2017 1216   K 4.4 10/27/2020 0122   CL 93 (L) 10/27/2020 0122   CO2 35 (H) 10/27/2020 0122   GLUCOSE 194 (H) 10/27/2020 0122   BUN 22 10/27/2020 0122   BUN 22 02/12/2017 1216   CREATININE 0.65 10/27/2020 0122   CALCIUM 8.7 (L) 10/27/2020 0122   GFRNONAA >60 10/27/2020 0122   GFRAA >60 10/20/2019 0220    BNP    Component Value Date/Time   BNP 62.2  10/22/2020 1150    ProBNP    Component Value Date/Time   PROBNP 121.6 02/20/2014 1435    Specialty Problems       Pulmonary Problems   Pulmonary emphysema (HCC)   Dyspnea on exertion   CAP (community acquired pneumonia)   COPD (chronic obstructive pulmonary disease) ([place])   Influenza B   Oxygen dependent   Pulmonary nodule   Acute on chronic respiratory failure with hypoxia (HCC)   Chronic respiratory failure with hypoxia (HCC)   Pleural effusion on right   Pleural effusion, right   PNA (pneumonia)   Cough with hemoptysis    Allergies  Allergen Reactions   Sulfa Antibiotics     Other reaction(s): nausea & vomiting   Sulfamethoxazole-Trimethoprim     Other reaction(s): Unknown   Ace Inhibitors Cough   Elemental Sulfur Nausea And Vomiting    Immunization History  Administered Date(s) Administered   Fluad Quad(high Dose 65+) 01/26/2020, 04/02/2020   Influenza Split 06/04/2011, 12/10/2011, 03/31/2012, 12/15/2013   Influenza, High Dose Seasonal PF 11/30/2018   Influenza, Quadrivalent, Recombinant, Inj, Pf 04/15/2018, 12/14/2018   Influenza,inj,Quad PF,6+ Mos 12/15/2013, 12/20/2014   Influenza-Unspecified 12/29/2013, 12/13/2015, 04/09/2017   PPD Test 05/13/2011   Pneumococcal Conjugate-13 06/27/2015   Pneumococcal Polysaccharide-23 04/25/2009, 04/08/2014, 01/26/2020   Tdap 06/04/2011, 06/26/2014   Zoster, Live 06/04/2011    Past Medical History:  Diagnosis Date   Anxiety    Arthritis    Atrial fibrillation ([place])    Cerumen impaction    COPD (chronic obstructive pulmonary disease) (HCC)    Dysrhythmia    AF   Essential hypertension, benign    Gallstones    GERD (gastroesophageal reflux disease)    H/O hiatal hernia    History of nuclear stress test 10/2010   dipyridamole; normal pattern of perfusion; low risk, normal study   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    Mild aortic insufficiency    Osteoporosis    Peripheral  neuropathy    Pneumonia    states she had it twice, last time a couple of months ago   PONV (postoperative nausea and vomiting)    Shortness of breath    with exertion   Unspecified hypothyroidism    Venous insufficiency     Tobacco History: Social History   Tobacco Use  Smoking Status Former   Packs/day: 1.00   Years: 20.00   Pack years: 20.00   Types: Cigarettes   Start date: 73   Quit date: 09/12/1978   Years since quitting: 42.3  Smokeless Tobacco Never   Counseling given: Not Answered   Continue to not smoke  Outpatient Encounter Medications as of 12/28/2020  Medication Sig  albuterol (PROVENTIL) (2.5 MG/3ML) 0.083% nebulizer solution Take 2.5 mg by nebulization 2 (two) times daily as needed for wheezing or shortness of breath.   albuterol (VENTOLIN HFA) 108 (90 Base) MCG/ACT inhaler Inhale 2 puffs into the lungs every 4 (four) hours as needed for wheezing or shortness of breath.   apixaban (ELIQUIS) 2.5 MG TABS tablet Take 1 tablet (2.5 mg total) by mouth 2 (two) times daily.   Budeson-Glycopyrrol-Formoterol (BREZTRI AEROSPHERE) 160-9-4.8 MCG/ACT AERO Take 2 puffs by mouth 2 (two) times daily.   calcium citrate-vitamin D (CITRACAL+D) 315-200 MG-UNIT per tablet Take 1 tablet by mouth daily.   Carboxymethylcellulose Sodium (THERATEARS OP) Place 2 drops into both eyes daily.    diltiazem (CARDIZEM CD) 180 MG 24 hr capsule Take 180 mg by mouth daily.    doxycycline (VIBRA-TABS) 100 MG tablet Take 1 tablet (100 mg total) by mouth 2 (two) times daily.   feeding supplement, ENSURE ENLIVE, (ENSURE ENLIVE) LIQD Take 237 mLs by mouth 2 (two) times daily between meals.   fluticasone (FLONASE) 50 MCG/ACT nasal spray Place 2 sprays into both nostrils daily.   furosemide (LASIX) 40 MG tablet Take 1 tablet (40 mg total) by mouth daily. May take extra 20mg (60mg  am) for weight gain of 3#/overnight or 5#/weekly   guaiFENesin-dextromethorphan (ROBITUSSIN DM) 100-10 MG/5ML syrup Take 10  mLs by mouth every 6 (six) hours as needed for cough.   Lactobacillus Reuteri (BIOGAIA PROBIOTIC PO) Take 1 capsule by mouth daily.   levothyroxine (SYNTHROID, LEVOTHROID) 137 MCG tablet Take 137 mcg by mouth daily before breakfast.    magnesium oxide (MAG-OX) 400 MG tablet Take 400 mg by mouth daily.   Multiple Vitamins-Minerals (MULTIVITAMIN WITH MINERALS) tablet Take 1 tablet by mouth daily.   polyethylene glycol (MIRALAX / GLYCOLAX) packet Take 17 g by mouth daily.   potassium chloride SA (KLOR-CON) 20 MEQ tablet Take 1 tablet (20 mEq total) by mouth daily. May take extra 20mg  (am) for weight gain 3#/daily or 5#/weekly   predniSONE (DELTASONE) 10 MG tablet 4 tabs for 2 days, then 3 tabs for 2 days, 2 tabs for 2 days, then 1 tab for 2 days, then stop   No facility-administered encounter medications on file as of 12/28/2020.     Review of Systems  Review of Systems  Constitutional:  Positive for fatigue. Negative for activity change, appetite change and fever.  HENT:  Negative for nosebleeds, sinus pressure, sinus pain and sore throat.   Respiratory:  Positive for cough (With hemoptysis) and shortness of breath. Negative for wheezing.   Cardiovascular:  Negative for chest pain and palpitations.  Gastrointestinal:  Negative for diarrhea, nausea and vomiting.  Musculoskeletal:  Negative for arthralgias.  Neurological:  Negative for dizziness.  Psychiatric/Behavioral:  Negative for sleep disturbance. The patient is not nervous/anxious.     Physical Exam  BP 120/70 (BP Location: Right Arm, Patient Position: Sitting, Cuff Size: Normal)   Pulse (!) 51   Ht 5\' 3"  (1.6 m)   Wt 103 lb 6.4 oz (46.9 kg)   SpO2 96%   BMI 18.32 kg/m   Wt Readings from Last 5 Encounters:  12/28/20 103 lb 6.4 oz (46.9 kg)  11/28/20 104 lb 12.8 oz (47.5 kg)  11/14/20 101 lb 6.4 oz (46 kg)  10/27/20 101 lb 3.1 oz (45.9 kg)  08/14/20 103 lb 12.8 oz (47.1 kg)    BMI Readings from Last 5 Encounters:   12/28/20 18.32 kg/m  11/28/20 18.56 kg/m  11/14/20 17.96 kg/m  10/27/20 17.93 kg/m  08/14/20 18.39 kg/m     Physical Exam Vitals and nursing note reviewed.  Constitutional:      General: She is not in acute distress.    Appearance: Normal appearance. She is not ill-appearing.     Comments: Frail, in wheelchair  HENT:     Head: Normocephalic and atraumatic.     Right Ear: External ear normal.     Left Ear: External ear normal.     Nose: Nose normal. No congestion or rhinorrhea.     Comments: No visible nosebleeds present, nasal cannula in place    Mouth/Throat:     Mouth: Mucous membranes are moist.     Pharynx: Oropharynx is clear.     Comments: No visible signs of bleeding in oropharynx Eyes:     Pupils: Pupils are equal, round, and reactive to light.  Cardiovascular:     Rate and Rhythm: Normal rate and regular rhythm.     Pulses: Normal pulses.     Heart sounds: Normal heart sounds. No murmur heard. Pulmonary:     Effort: Pulmonary effort is normal. No respiratory distress.     Breath sounds: No decreased air movement. No decreased breath sounds, wheezing or rales.     Comments: Diminished breath sounds throughout exam Musculoskeletal:     Cervical back: Normal range of motion.  Skin:    General: Skin is warm and dry.     Capillary Refill: Capillary refill takes less than 2 seconds.  Neurological:     General: No focal deficit present.     Mental Status: She is alert and oriented to person, place, and time. Mental status is at baseline.     Gait: Gait normal.  Psychiatric:        Mood and Affect: Mood normal.        Behavior: Behavior normal.        Thought Content: Thought content normal.        Judgment: Judgment normal.      Assessment & Plan:   Chronic respiratory failure with hypoxia (HCC) Plan: Continue oxygen therapy  COPD (chronic obstructive pulmonary disease) (HCC) Plan: Continue Breztri Doxycycline today Prednisone taper today, then  resume chronic daily prednisone of 10 mg Chest x-ray today Lab work today Close follow-up with our office in 2 weeks Discussed case with Dr. Lamonte Sakai today  Cough with hemoptysis Patient self reporting cough with hemoptysis No visible samples present today Patient currently taking Eliquis  Plan: Lab work today Stat chest x-ray today Discussed case with Dr. Lamonte Sakai Start Delsym over-the-counter cough medicine for cough suppression Reviewed symptoms to monitor for, if patient symptoms worsen present to emergency room for further evaluation May need to consider CT chest imaging based off chest x-ray results   Pulmonary nodule Plan: Proceed forward with planned CT chest imaging in November/2022 Based off chest x-ray imaging today May need to consider earlier CT scan    Return in about 2 weeks (around 01/11/2021), or if symptoms worsen or fail to improve, for Follow up with Dr. Lamonte Sakai.   [name], NP 12/28/2020   This appointment required 36 minutes of patient care (this includes precharting, chart review, review of results, face-to-face care, etc.).

## 2020-12-28 NOTE — Progress Notes (Signed)
Spoke with pt's daughter and she voices understanding. Pt's daughter did not have any further questions.

## 2020-12-28 NOTE — Progress Notes (Signed)
Subjective: Heather [name] is a pleasant 87 y.o. female patient seen today painful thick toenails that are difficult to trim. Pain interferes with ambulation. Aggravating factors include wearing enclosed shoe gear. Pain is relieved with periodic professional debridement.   Heather [name] voices no new pedal problems on today's visit. She has h/o CHF and COPD. She uses supplemental O2 via nasal cannula.  PCP is Burnard Bunting, MD. Last visit was: 11/20/2020.  Allergies  Allergen Reactions   Sulfa Antibiotics     Other reaction(s): nausea & vomiting   Sulfamethoxazole-Trimethoprim     Other reaction(s): Unknown   Ace Inhibitors Cough   Elemental Sulfur Nausea And Vomiting    Objective: Physical Exam  General: Heather [name] is a pleasant 87 y.o. Caucasian female, frail, in NAD. AAO x 3.   Vascular:  Capillary fill time to digits <3 seconds b/l lower extremities. Faintly palpable pedal pulses b/l. Pedal hair absent. Lower extremity skin temperature gradient within normal limits. No pain with calf compression b/l. +1 pitting edema b/l lower extremities.  Dermatological:  Pedal skin is thin shiny, atrophic b/l lower extremities. No open wounds b/l lower extremities No interdigital macerations b/l lower extremities Toenails 2-5 bilaterally elongated, discolored, dystrophic, thickened, and crumbly with subungual debris and tenderness to dorsal palpation. Anonychia noted L hallux and R hallux. Nailbed(s) epithelialized.   Musculoskeletal:  Normal muscle strength 5/5 to all lower extremity muscle groups bilaterally. No pain crepitus or joint limitation noted with ROM b/l. No gross bony deformities bilaterally. Utilizes rollator for ambulation assistance.  Neurological:  Protective sensation intact 5/5 intact bilaterally with 10g monofilament b/l. Vibratory sensation intact b/l.  Assessment and Plan:  1. Pain due to onychomycosis of toenails of both feet    -No new findings. No new  orders. -Patient to continue soft, supportive shoe gear daily. -Toenails 2-5 bilaterally debrided in length and girth without iatrogenic bleeding with sterile nail nipper and dremel.  -Patient to report any pedal injuries to medical professional immediately. -Patient/POA to call should there be question/concern in the interim.  Return in about 3 months (around 03/25/2021).  Marzetta Board, DPM

## 2020-12-28 NOTE — Progress Notes (Signed)
Chest Xray stable. Keep close follow up in 2 weeks. Continue pred taper and Doxy.   No need for earlier CT imaging at this time.   If symptoms are not improving please contact our office or present to an ER as discussed in office today.   Wyn Quaker NP

## 2020-12-28 NOTE — Assessment & Plan Note (Signed)
Plan: Proceed forward with planned CT chest imaging in November/2022 Based off chest x-ray imaging today May need to consider earlier CT scan

## 2020-12-28 NOTE — Assessment & Plan Note (Signed)
Plan: Continue oxygen therapy 

## 2020-12-28 NOTE — Assessment & Plan Note (Signed)
Plan: Continue Breztri Doxycycline today Prednisone taper today, then resume chronic daily prednisone of 10 mg Chest x-ray today Lab work today Close follow-up with our office in 2 weeks Discussed case with Dr. Lamonte Sakai today

## 2020-12-28 NOTE — Progress Notes (Signed)
CBC blood work is stable. Hemoglobin is stable. WBC is elevated likely due to chronic prednisone use.   No new recs at this time.   Wyn Quaker NP

## 2020-12-28 NOTE — Patient Instructions (Addendum)
You were seen today by [name], NP  for:   1. Cough with hemoptysis  - CBC with Differential/Platelet; Future - DG Chest 2 View; Future  Chest x-ray today >>> Proceed to [address], proceed to basement floor, radiology  Lab work today  Okay to start Delsym over-the-counter cough medicine to help with cough suppression  As discussed today in office if symptoms worsen such as worsening cough, increased hemoptysis/coughing up blood, increased fatigue, if you become acutely confused please proceed to an emergency department for further evaluation  2. Chronic respiratory failure with hypoxia (HCC)  Continue oxygen therapy as outlined  3. Chronic obstructive pulmonary disease with acute exacerbation (HCC)  - doxycycline (VIBRA-TABS) 100 MG tablet; Take 1 tablet (100 mg total) by mouth 2 (two) times daily.  Dispense: 14 tablet; Refill: 0 - predniSONE (DELTASONE) 10 MG tablet; 4 tabs for 2 days, then 3 tabs for 2 days, 2 tabs for 2 days, then 1 tab for 2 days, then stop  Dispense: 20 tablet; Refill: 0  Doxycycline >>> 1 100 mg tablet every 12 hours for 7 days >>>take with food  >>>wear sunscreen   Prednisone 10mg  tablet  >>>4 tabs for 2 days, then 3 tabs for 2 days, 2 tabs for 2 days, then 1 tab for 2 days, then stop >>>take with food  >>>take in the morning   When you finish the prednisone taper he can resume chronic daily 10 mg of prednisone  Breztri >>> 2 puffs in the morning right when you wake up, rinse out your mouth after use, 12 hours later 2 puffs, rinse after use >>> Take this daily, no matter what >>> This is not a rescue inhaler   Only use your albuterol as a rescue medication to be used if you can't catch your breath by resting or doing a relaxed purse lip breathing pattern.  - The less you use it, the better it will work when you need it. - Ok to use up to 2 puffs  every 4 hours if you must but call for immediate appointment if use goes up over your usual  need - Don't leave home without it !!  (think of it like the spare tire for your car)   Note your daily symptoms > remember "red flags" for COPD:   >>>Increase in cough >>>increase in sputum production >>>increase in shortness of breath or activity  intolerance.   If you notice these symptoms, please call the office to be seen.    4. Pulmonary nodule  Keep scheduled follow-up for repeat CT in November/2022   We recommend today:  Orders Placed This Encounter  Procedures   DG Chest 2 View    Standing Status:   Future    Standing Expiration Date:   04/29/2021    Order Specific Question:   Reason for Exam (SYMPTOM  OR DIAGNOSIS REQUIRED)    Answer:   hemotypsis    Order Specific Question:   Preferred imaging location?    Answer:   [facility]    Order Specific Question:   Radiology Contrast Protocol - do NOT remove file path    Answer:   [email or web address]   CBC with Differential/Platelet    Standing Status:   Future    Standing Expiration Date:   12/28/2021   Orders Placed This Encounter  Procedures   DG Chest 2 View   CBC with Differential/Platelet   Meds ordered this encounter  Medications   doxycycline (VIBRA-TABS) 100  MG tablet    Sig: Take 1 tablet (100 mg total) by mouth 2 (two) times daily.    Dispense:  14 tablet    Refill:  0   predniSONE (DELTASONE) 10 MG tablet    Sig: 4 tabs for 2 days, then 3 tabs for 2 days, 2 tabs for 2 days, then 1 tab for 2 days, then stop    Dispense:  20 tablet    Refill:  0    Follow Up:    Return in about 2 weeks (around 01/11/2021), or if symptoms worsen or fail to improve, for Follow up with Dr. Lamonte Sakai. Or with APP for follow-up  Notification of test results are managed in the following manner: If there are  any recommendations or changes to the  plan of care discussed in office today,  we will contact you and let you know what they are. If you do not hear from [place], then  your results are normal and you can view them through your  MyChart account , or a letter will be sent to you. Thank you again for trusting [place] with your care  - Thank you, [facility]    It is flu season:   >>> Best ways to protect herself from the flu: Receive the yearly flu vaccine, practice good hand hygiene washing with soap and also using hand sanitizer when available, eat a nutritious meals, get adequate rest, hydrate appropriately       Please contact the office if your symptoms worsen or you have concerns that you are not improving.   Thank you for choosing [facility] for your healthcare, and for allowing [place] to partner with you on your healthcare journey. I am thankful to be able to provide care to you today.   Wyn Quaker FNP-C   Hemoptysis Hemoptysis is coughing up blood. With mild hemoptysis, you may cough up small amounts of blood-streaked saliva and mucus from your lungs (sputum). With severe hemoptysis, you may cough up a lot of blood. Coughing up 1-2 cups (240-480 mL) of blood within 24 hours is a medical emergency. Common causes of mild (nonmassive) hemoptysis include: An infection in your nose, throat, or lungs, such as bronchitis, pneumonia, bronchiectasis, asthma, or chronic obstructive pulmonary disease. Nosebleeds. Breathing in (inhaling) a foreign object. Common causes of severe (massive) hemoptysis include: Tuberculosis (TB). A tumor in the lungs or upper airway. A blood clot in the lungs (pulmonary embolism). Stomach (gastrointestinal) bleeding or ulcer. Taking blood thinner (anticoagulant) medicine. Having a medical condition that keeps your blood from normal clotting. Sometimes, the cause is not known. Follow these instructions at home: Medicines If you were prescribed an antibiotic medicine, take it as told by your health care provider. Do not stop taking the antibiotic even if you start to feel better. Take over-the-counter and  prescription medicines only as told by your health care provider. General instructions  Do not use any products that contain nicotine or tobacco, such as cigarettes, e-cigarettes, and chewing tobacco. If you need help quitting, ask your health care provider. Return to your normal activities as told by your health care provider. Ask your health care provider what activities are safe for you. This includes any exercise or air travel. Keep all follow-up visits as told by your health care provider. This is important. Contact a health care provider if: You have a fever over 100.57F (38C). The amount of bleeding increases. The blood looks brighter or darker red. Get help right away  if you: Cough up fresh blood or blood clots. Cough up 1-2 cups (240-480 mL) in 24 hours. Have trouble breathing. Feel like you are choking. Have chest pain, light headedness, or dizziness. Summary Hemoptysis is coughing up blood. Coughing up 1-2 cups (240-480 mL) of blood within 24 hours is a medical emergency. Do not use any products that contain nicotine or tobacco, such as cigarettes, e-cigarettes, and chewing tobacco. If you need help quitting, ask your health care provider. This information is not intended to replace advice given to you by your health care provider. Make sure you discuss any questions you have with your health care provider. Document Revised: 05/30/2020 Document Reviewed: 03/28/2019 Elsevier Patient Education  2022 [employer].

## 2020-12-28 NOTE — Assessment & Plan Note (Signed)
Patient self reporting cough with hemoptysis No visible samples present today Patient currently taking Eliquis  Plan: Lab work today Stat chest x-ray today Discussed case with Dr. Lamonte Sakai Start Delsym over-the-counter cough medicine for cough suppression Reviewed symptoms to monitor for, if patient symptoms worsen present to emergency room for further evaluation May need to consider CT chest imaging based off chest x-ray results

## 2020-12-31 ENCOUNTER — Ambulatory Visit: Payer: Medicare HMO | Admitting: Adult Health

## 2021-01-03 ENCOUNTER — Emergency Department (HOSPITAL_COMMUNITY): Payer: Medicare HMO

## 2021-01-03 ENCOUNTER — Inpatient Hospital Stay (HOSPITAL_COMMUNITY)
Admission: EM | Admit: 2021-01-03 | Discharge: 2021-01-06 | DRG: 189 | Disposition: A | Discharge status: Home / Self Care | Payer: Medicare HMO | Attending: Internal Medicine | Admitting: Internal Medicine

## 2021-01-03 ENCOUNTER — Encounter: Payer: Self-pay | Admitting: Adult Health

## 2021-01-03 ENCOUNTER — Ambulatory Visit: Payer: Medicare HMO | Admitting: Adult Health

## 2021-01-03 ENCOUNTER — Other Ambulatory Visit: Payer: Self-pay

## 2021-01-03 ENCOUNTER — Encounter (HOSPITAL_COMMUNITY): Payer: Self-pay

## 2021-01-03 ENCOUNTER — Inpatient Hospital Stay (HOSPITAL_COMMUNITY): Payer: Medicare HMO

## 2021-01-03 DIAGNOSIS — J441 Chronic obstructive pulmonary disease with (acute) exacerbation: Secondary | ICD-10-CM

## 2021-01-03 DIAGNOSIS — J9601 Acute respiratory failure with hypoxia: Secondary | ICD-10-CM | POA: Diagnosis present

## 2021-01-03 DIAGNOSIS — Z9841 Cataract extraction status, right eye: Secondary | ICD-10-CM

## 2021-01-03 DIAGNOSIS — Z8052 Family history of malignant neoplasm of bladder: Secondary | ICD-10-CM

## 2021-01-03 DIAGNOSIS — R911 Solitary pulmonary nodule: Secondary | ICD-10-CM | POA: Diagnosis present

## 2021-01-03 DIAGNOSIS — I5032 Chronic diastolic (congestive) heart failure: Secondary | ICD-10-CM | POA: Diagnosis not present

## 2021-01-03 DIAGNOSIS — J9811 Atelectasis: Secondary | ICD-10-CM | POA: Diagnosis present

## 2021-01-03 DIAGNOSIS — Z881 Allergy status to other antibiotic agents status: Secondary | ICD-10-CM

## 2021-01-03 DIAGNOSIS — J90 Pleural effusion, not elsewhere classified: Secondary | ICD-10-CM | POA: Diagnosis present

## 2021-01-03 DIAGNOSIS — G629 Polyneuropathy, unspecified: Secondary | ICD-10-CM | POA: Diagnosis not present

## 2021-01-03 DIAGNOSIS — Z79899 Other long term (current) drug therapy: Secondary | ICD-10-CM | POA: Diagnosis not present

## 2021-01-03 DIAGNOSIS — Z7901 Long term (current) use of anticoagulants: Secondary | ICD-10-CM

## 2021-01-03 DIAGNOSIS — F419 Anxiety disorder, unspecified: Secondary | ICD-10-CM | POA: Diagnosis present

## 2021-01-03 DIAGNOSIS — J910 Malignant pleural effusion: Secondary | ICD-10-CM | POA: Diagnosis present

## 2021-01-03 DIAGNOSIS — Z882 Allergy status to sulfonamides status: Secondary | ICD-10-CM

## 2021-01-03 DIAGNOSIS — E039 Hypothyroidism, unspecified: Secondary | ICD-10-CM | POA: Diagnosis present

## 2021-01-03 DIAGNOSIS — Z833 Family history of diabetes mellitus: Secondary | ICD-10-CM

## 2021-01-03 DIAGNOSIS — Z823 Family history of stroke: Secondary | ICD-10-CM | POA: Diagnosis not present

## 2021-01-03 DIAGNOSIS — Z8616 Personal history of COVID-19: Secondary | ICD-10-CM

## 2021-01-03 DIAGNOSIS — J9621 Acute and chronic respiratory failure with hypoxia: Principal | ICD-10-CM | POA: Diagnosis present

## 2021-01-03 DIAGNOSIS — M810 Age-related osteoporosis without current pathological fracture: Secondary | ICD-10-CM | POA: Diagnosis present

## 2021-01-03 DIAGNOSIS — K909 Intestinal malabsorption, unspecified: Secondary | ICD-10-CM | POA: Diagnosis present

## 2021-01-03 DIAGNOSIS — Z9049 Acquired absence of other specified parts of digestive tract: Secondary | ICD-10-CM

## 2021-01-03 DIAGNOSIS — J449 Chronic obstructive pulmonary disease, unspecified: Secondary | ICD-10-CM | POA: Diagnosis present

## 2021-01-03 DIAGNOSIS — Z961 Presence of intraocular lens: Secondary | ICD-10-CM | POA: Diagnosis present

## 2021-01-03 DIAGNOSIS — J439 Emphysema, unspecified: Secondary | ICD-10-CM | POA: Diagnosis not present

## 2021-01-03 DIAGNOSIS — Z87891 Personal history of nicotine dependence: Secondary | ICD-10-CM

## 2021-01-03 DIAGNOSIS — I4819 Other persistent atrial fibrillation: Secondary | ICD-10-CM | POA: Diagnosis present

## 2021-01-03 DIAGNOSIS — Z8701 Personal history of pneumonia (recurrent): Secondary | ICD-10-CM | POA: Diagnosis not present

## 2021-01-03 DIAGNOSIS — I4891 Unspecified atrial fibrillation: Secondary | ICD-10-CM

## 2021-01-03 DIAGNOSIS — J432 Centrilobular emphysema: Secondary | ICD-10-CM | POA: Diagnosis not present

## 2021-01-03 DIAGNOSIS — K219 Gastro-esophageal reflux disease without esophagitis: Secondary | ICD-10-CM | POA: Diagnosis present

## 2021-01-03 DIAGNOSIS — I872 Venous insufficiency (chronic) (peripheral): Secondary | ICD-10-CM | POA: Diagnosis present

## 2021-01-03 DIAGNOSIS — Z90710 Acquired absence of both cervix and uterus: Secondary | ICD-10-CM

## 2021-01-03 DIAGNOSIS — I110 Hypertensive heart disease with heart failure: Secondary | ICD-10-CM | POA: Diagnosis not present

## 2021-01-03 DIAGNOSIS — I351 Nonrheumatic aortic (valve) insufficiency: Secondary | ICD-10-CM | POA: Diagnosis present

## 2021-01-03 DIAGNOSIS — I700 Atherosclerosis of aorta: Secondary | ICD-10-CM | POA: Diagnosis not present

## 2021-01-03 DIAGNOSIS — Z79890 Hormone replacement therapy (postmenopausal): Secondary | ICD-10-CM

## 2021-01-03 DIAGNOSIS — Z9842 Cataract extraction status, left eye: Secondary | ICD-10-CM | POA: Diagnosis not present

## 2021-01-03 DIAGNOSIS — R0602 Shortness of breath: Secondary | ICD-10-CM | POA: Diagnosis not present

## 2021-01-03 DIAGNOSIS — C384 Malignant neoplasm of pleura: Secondary | ICD-10-CM | POA: Diagnosis not present

## 2021-01-03 LAB — CBC WITH DIFFERENTIAL/PLATELET
Abs Immature Granulocytes: 0.09 10*3/uL — ABNORMAL HIGH (ref 0.00–0.07)
Basophils Absolute: 0 10*3/uL (ref 0.0–0.1)
Basophils Relative: 0 %
Eosinophils Absolute: 0 10*3/uL (ref 0.0–0.5)
Eosinophils Relative: 0 %
HCT: 42.9 % (ref 36.0–46.0)
Hemoglobin: 14.2 g/dL (ref 12.0–15.0)
Immature Granulocytes: 1 %
Lymphocytes Relative: 7 %
Lymphs Abs: 0.8 10*3/uL (ref 0.7–4.0)
MCH: 32.3 pg (ref 26.0–34.0)
MCHC: 33.1 g/dL (ref 30.0–36.0)
MCV: 97.7 fL (ref 80.0–100.0)
Monocytes Absolute: 0.2 10*3/uL (ref 0.1–1.0)
Monocytes Relative: 2 %
Neutro Abs: 10.4 10*3/uL — ABNORMAL HIGH (ref 1.7–7.7)
Neutrophils Relative %: 90 %
Platelets: 391 10*3/uL (ref 150–400)
RBC: 4.39 MIL/uL (ref 3.87–5.11)
RDW: 14 % (ref 11.5–15.5)
WBC: 11.6 10*3/uL — ABNORMAL HIGH (ref 4.0–10.5)
nRBC: 0 % (ref 0.0–0.2)

## 2021-01-03 LAB — BASIC METABOLIC PANEL
Anion gap: 13 (ref 5–15)
BUN: 14 mg/dL (ref 8–23)
CO2: 32 mmol/L (ref 22–32)
Calcium: 9 mg/dL (ref 8.9–10.3)
Chloride: 95 mmol/L — ABNORMAL LOW (ref 98–111)
Creatinine, Ser: 0.83 mg/dL (ref 0.44–1.00)
GFR, Estimated: >60 mL/min (ref >60)
Glucose, Bld: 260 mg/dL — ABNORMAL HIGH (ref 70–99)
Potassium: 3.5 mmol/L (ref 3.5–5.1)
Sodium: 140 mmol/L (ref 135–145)

## 2021-01-03 LAB — BRAIN NATRIURETIC PEPTIDE: B Natriuretic Peptide: 107 pg/mL — ABNORMAL HIGH (ref 0.0–100.0)

## 2021-01-03 LAB — RESP PANEL BY RT-PCR (FLU A&B, COVID) ARPGX2
Influenza A by PCR: NEGATIVE
Influenza B by PCR: NEGATIVE
SARS Coronavirus 2 by RT PCR: NEGATIVE

## 2021-01-03 LAB — TROPONIN I (HIGH SENSITIVITY)
Troponin I (High Sensitivity): 15 ng/L (ref <18)
Troponin I (High Sensitivity): 15 ng/L (ref <18)

## 2021-01-03 MED ORDER — METHYLPREDNISOLONE SODIUM SUCC 125 MG IJ SOLR: 60.0000 mg | Freq: Two times a day (BID) | INTRAMUSCULAR | Status: DC

## 2021-01-03 MED ORDER — IPRATROPIUM-ALBUTEROL 0.5-2.5 (3) MG/3ML IN SOLN: 3.0000 mL | RESPIRATORY_TRACT | Status: DC | PRN

## 2021-01-03 MED ORDER — FUROSEMIDE 10 MG/ML IJ SOLN
40.0000 mg | Freq: Once | INTRAMUSCULAR | Status: AC
  Administered 2021-01-04: 40 mg via INTRAVENOUS
  Filled 2021-01-03: qty 4

## 2021-01-03 MED ORDER — ALBUTEROL (5 MG/ML) CONTINUOUS INHALATION SOLN: 2.5000 mg/h | INHALATION_SOLUTION | Freq: Once | RESPIRATORY_TRACT | Status: DC

## 2021-01-03 MED ORDER — LEVOTHYROXINE SODIUM 25 MCG PO TABS
137.0000 ug | ORAL_TABLET | Freq: Every day | ORAL | Status: DC
  Administered 2021-01-04 – 2021-01-06 (×3): 137 ug via ORAL
  Filled 2021-01-03 (×3): qty 1

## 2021-01-03 MED ORDER — APIXABAN 2.5 MG PO TABS
2.5000 mg | ORAL_TABLET | Freq: Two times a day (BID) | ORAL | Status: DC
  Filled 2021-01-03: qty 1

## 2021-01-03 MED ORDER — METHYLPREDNISOLONE SODIUM SUCC 40 MG IJ SOLR
40.0000 mg | Freq: Two times a day (BID) | INTRAMUSCULAR | Status: DC
  Administered 2021-01-04: 40 mg via INTRAVENOUS
  Filled 2021-01-03: qty 1

## 2021-01-03 MED ORDER — FLUTICASONE FUROATE-VILANTEROL 200-25 MCG/INH IN AEPB
1.0000 | INHALATION_SPRAY | Freq: Every day | RESPIRATORY_TRACT | Status: DC
  Administered 2021-01-04 – 2021-01-06 (×3): 1 via RESPIRATORY_TRACT
  Filled 2021-01-03: qty 28

## 2021-01-03 MED ORDER — ACETAMINOPHEN 650 MG RE SUPP: 650.0000 mg | Freq: Four times a day (QID) | RECTAL | Status: DC | PRN

## 2021-01-03 MED ORDER — METHYLPREDNISOLONE SODIUM SUCC 40 MG IJ SOLR: 40.0000 mg | Freq: Every day | INTRAMUSCULAR | Status: DC

## 2021-01-03 MED ORDER — DILTIAZEM HCL ER COATED BEADS 180 MG PO CP24
180.0000 mg | ORAL_CAPSULE | Freq: Every day | ORAL | Status: DC
  Administered 2021-01-04 – 2021-01-06 (×3): 180 mg via ORAL
  Filled 2021-01-03 (×3): qty 1

## 2021-01-03 MED ORDER — ALBUTEROL SULFATE (2.5 MG/3ML) 0.083% IN NEBU
10.0000 mg/h | INHALATION_SOLUTION | Freq: Once | RESPIRATORY_TRACT | Status: AC
  Administered 2021-01-03: 10 mg/h via RESPIRATORY_TRACT
  Filled 2021-01-03: qty 12

## 2021-01-03 MED ORDER — METHYLPREDNISOLONE SODIUM SUCC 40 MG IJ SOLR
40.0000 mg | Freq: Once | INTRAMUSCULAR | Status: AC
  Administered 2021-01-03: 40 mg via INTRAVENOUS
  Filled 2021-01-03: qty 1

## 2021-01-03 MED ORDER — BUDESON-GLYCOPYRROL-FORMOTEROL 160-9-4.8 MCG/ACT IN AERO: 2.0000 | INHALATION_SPRAY | Freq: Two times a day (BID) | RESPIRATORY_TRACT | Status: DC

## 2021-01-03 MED ORDER — UMECLIDINIUM BROMIDE 62.5 MCG/INH IN AEPB
1.0000 | INHALATION_SPRAY | Freq: Every day | RESPIRATORY_TRACT | Status: DC
  Administered 2021-01-04 – 2021-01-06 (×3): 1 via RESPIRATORY_TRACT
  Filled 2021-01-03: qty 7

## 2021-01-03 MED ORDER — ACETAMINOPHEN 325 MG PO TABS: 650.0000 mg | ORAL_TABLET | Freq: Four times a day (QID) | ORAL | Status: DC | PRN

## 2021-01-03 MED ORDER — IPRATROPIUM-ALBUTEROL 0.5-2.5 (3) MG/3ML IN SOLN
3.0000 mL | Freq: Once | RESPIRATORY_TRACT | Status: AC
  Administered 2021-01-03: 3 mL via RESPIRATORY_TRACT
  Filled 2021-01-03: qty 3

## 2021-01-03 NOTE — H&P (Signed)
Date: 01/04/2021               Patient Name:  Heather [name] MRN: [id number]  DOB: 07/23/1926 Age / Sex: 87 y.o., female   PCP: Burnard Bunting, MD         Medical Service: Internal Medicine Teaching Service         Attending Physician: Dr. Lucious Groves, DO    First Contact: Dr. Lorin Glass Pager: [phone]  Second Contact: Dr. Lisabeth Devoid Pager: [phone]       After Hours (After 5p/  First Contact Pager: [phone]  weekends / holidays): Second Contact Pager: [phone]   Chief Complaint: Acute respiratory failure with hypoxia  History of Present Illness:   Heather [name] is a 87 year old female with past medical history of COPD on 3 L at home at rest and 5 L with exertion, lung nodule, persistent right pleural effusion, A. fib on Eliquis, hypothyroidism, and HFpEF who presents to the ED for acute respiratory failure and hypoxia.  Patient reports having dyspnea, chest tightness, coughing with pink-colored sputum 1 week ago which she was seen in her pulmonologist office.  She was diagnosed with COPD exacerbation and was started on doxycycline and prednisone for 7 days.  Patient report improvement but started to have worsening shortness of breath with exertion and weakness today.  Also had pink-colored sputum.  She reports coughing with some sputum production but denies any change in sputum color or quantity.  She was seen in the pulmonology office and was sent to the ED.   She also reports right-sided chest pain, back pain radiates to her side.  Said the pain comes and go, sharp in nature.  Pain usually happen at rest, lasts less than 1 minute.  Denies any shortness of breath associated with the pain.  She was admitted in May for acute hypoxic respiratory failure and sepsis secondary to pneumonia.  She was admitted again in July for similar problem.  She was found to have a right pleural effusion.  Patient say that they did not perform thoracentesis given her high risk factors and inadequate  volume.  She also has a right pulmonary nodule that has slightly increased in size suspicious for metastatic disease.  Patient reports taking all of her medications as instructed.  Report her weight is stable.  Report chronic lower extremity edema that is better with compression stocking and leg elevation..  She is taking Lasix daily.  In the ED, she was normotensive but tachycardic in the 120s.  She satting well on 3 L of nasal cannula.  She was given nebulizer and steroid treatment.  Chest x-ray showed persistent right pleural effusion.  Chest CT showed slightly increased in size of the right pleural effusion.  The right middle lobe mass is also increasing in size from 3.3 to 3.9 cm, suspicious for metastatic disease.  Pulmonology was consulted.  Meds:  No outpatient medications have been marked as taking for the 01/03/21 encounter [facility] Encounter).     Allergies: Allergies as of 01/03/2021 - Review Complete 01/03/2021  Allergen Reaction Noted   Sulfa antibiotics  08/13/2020   Sulfamethoxazole-trimethoprim  08/13/2020   Ace inhibitors Cough 12/01/2012   Elemental sulfur Nausea And Vomiting 01/16/2013   Past Medical History:  Diagnosis Date   Anxiety    Arthritis    Atrial fibrillation (HCC)    Cerumen impaction    COPD (chronic obstructive pulmonary disease) (HCC)    Dysrhythmia    AF   Essential  hypertension, benign    Gallstones    GERD (gastroesophageal reflux disease)    H/O hiatal hernia    History of nuclear stress test 10/2010   dipyridamole; normal pattern of perfusion; low risk, normal study   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    Mild aortic insufficiency    Osteoporosis    Peripheral neuropathy    Pneumonia    states she had it twice, last time a couple of months ago   PONV (postoperative nausea and vomiting)    Shortness of breath    with exertion   Unspecified hypothyroidism    Venous insufficiency     Family History:   Family History  Problem Relation Age of Onset   Stroke Mother    Stroke Father    Heart block Brother    Bladder Cancer Brother    Diabetes Brother    Stroke Brother      Social History:  Lives with daughter and son Good supportive home Drink alcohol socially Quit smoking since the 80s, used to smoke 1 pack a day No other drug use  Review of Systems: A complete ROS was negative except as per HPI.   Physical Exam: Blood pressure 136/76, pulse (!) 52, temperature 98.9 F (37.2 C), temperature source Oral, resp. rate (!) 48, SpO2 94 %. Physical Exam Constitutional:      Comments: In mild acute respiratory distress.   HENT:     Head: Normocephalic.     Nose: Nose normal.     Mouth/Throat:     Mouth: Mucous membranes are moist.     Pharynx: No oropharyngeal exudate.  Eyes:     General: No scleral icterus.       Right eye: No discharge.        Left eye: No discharge.     Conjunctiva/sclera: Conjunctivae normal.  Cardiovascular:     Rate and Rhythm: Regular rhythm. Tachycardia present.     Comments: No JVD.  +2 LE edema. Pulmonary:     Comments: Crackles and diminished breath sounds at the right lower lung base.  Mild wheezing diffusely.  Clear lung sounds on the left.  No accessory muscle use. Abdominal:     General: There is no distension.     Palpations: Abdomen is soft.     Tenderness: There is no abdominal tenderness.  Musculoskeletal:        General: No deformity or signs of injury. Normal range of motion.     Cervical back: Normal range of motion and neck supple.  Skin:    General: Skin is warm.     Coloration: Skin is not jaundiced.  Neurological:     General: No focal deficit present.     Mental Status: She is alert and oriented to person, place, and time.  Psychiatric:        Mood and Affect: Mood normal.        Behavior: Behavior normal.     EKG: personally reviewed my interpretation is sinus rhythm with PVC  CXR: personally reviewed my  interpretation is right pleural effusion, otherwise clear  CT chest IMPRESSION: Right-sided pleural effusion with right basilar atelectasis. This is increased from the prior exam.   Slight enlargement in partially calcified area of right middle lobe density now measuring 3.9 cm increased from 3.3 cm. Again this is suspicious for metastatic disease.   Multiple thoracic compression deformities which are chronic in nature.    Assessment & Plan by Problem: Heather [name] is  a 87 year old female with past medical history of COPD on 3 L at home at rest and 5 L with exertion, lung nodule, persistent right pleural effusion, A. fib on Eliquis, hypothyroidism, and HFpEF who presents to the ED for acute respiratory failure and hypoxia likely due to COPD exacerbation and worsening of the right pleural effusion.  Principal Problem:   Acute respiratory failure with hypoxia (HCC) Active Problems:   Atrial fibrillation (HCC)   COPD (chronic obstructive pulmonary disease) (HCC)   Pulmonary nodule   Pleural effusion, right  Acute respiratory failure with hypoxia COPD exacerbation Her worsening dyspnea on exertion is likely due to COPD exacerbation and worsening of right pleural effusion.  She was treated with doxycycline and prednisone a week ago for COPD exacerbation but failed to improve.  She has received nebulizer treatment and IV Solu-Medrol in the ED.  We will continue her inhaler, nebulizer and prednisone.   Do not see any evidence of pneumonia at this time.  No consolidation on chest x-ray or CT.  She denies fever or chills and there is no change in her sputum.  Low suspicion for PE when she is adherent to her Eliquis. -Appreciate pulmonology recommendations -RT was unable to obtain blood gas -Continue oxygen supplement.  Currently at baseline 3 L. -Continue IV Solu-Medrol.  -DuoNebs as needed -Breo and Incruse Ellipta daily -No indication for antibiotic at this time  Right pleural  effusion Right pulmonary nodule Her effusion is likely malignant secondary to the right middle lobe mass.  Tumor is increased in size from 3.3 to 3.9 cm.  Right pleural effusion also increased since prior.  She is not a candidate for bronchoscopy due to multiple comorbidities.  Defer decision of thoracentesis to pulmonology.  Right-sided chest pain, back pain and side pain Her pain is noncardiac and atypical.  ACS was ruled out with flat troponin and unremarkable EKG. She does have history of right rib fracture with notable thoracic compression fracture, which could be related to her pain.  Currently denies any pain. -Tylenol as needed -Lidocaine patch if needed  HFpEF Echocardiogram in 01/2020 showed normal EF.  She is taking Lasix 40 mg at home.  Reports stable weight.  BNP is only mildly elevated. -Will give 1 dose of IV Lasix 40 mg for her LE edema and right pleural effusion  Persistent A. fib -Resume diltiazem and Eliquis  Hypothyroidism -Resume levothyroxine -Check TSH  Code full Diet heart healthy IVF: None DVT: Eliquis   Dispo: Admit patient to Inpatient with expected length of stay greater than 2 midnights.  SignedGaylan Gerold, DO 01/04/2021, 12:03 AM  Pager: [phone] After 5pm on weekdays and 1pm on weekends: On Call pager: [phone]

## 2021-01-03 NOTE — ED Provider Notes (Signed)
Emergency Medicine Provider Triage Evaluation Note  Heather [name] , a 87 y.o. female  was evaluated in triage.  Pt complains of Sob, left chest pain. Pain worse with breathing. See at [facility] today who sent for evaluation.   Patient only able to speak in short sentences. Appears SOB, On home 3 L [place]  Review of Systems  Positive: SOB, cough, CP Negative: Back pain, LE edema  Physical Exam  BP 127/70 (BP Location: Right Arm)   Pulse 63   Temp 98.9 F (37.2 C) (Oral)   Resp (!) 26   SpO2 100%  Gen:   Awake, no distress   Resp:  Increased effort, 3 L [place], speaks in short sentences, Decreased movement at bases R>L MSK:   Moves extremities without difficulty  Other:    Medical Decision Making  Medically screening exam initiated at 3:07 PM.  Appropriate orders placed.  OLESYA WIKE was informed that the remainder of the evaluation will be completed by another provider, this initial triage assessment does not replace that evaluation, and the importance of remaining in the ED until their evaluation is complete.  SOB, Appear to have increased WOB.  Patient needs room in back   [place], Christine Schiefelbein A, PA-C 01/03/21 1511    Margette Fast, MD 01/05/21 2023

## 2021-01-03 NOTE — Assessment & Plan Note (Signed)
Severe COPD exacerbation-no significant improvement with progressive worsening despite antibiotics and steroids.  She also has an associated right-sided pleural effusion.  Underlying suspicious slow-growing pulmonary nodule for underlying lung cancer.  Patient continues to have failure to thrive with high symptom burden. Now with new onset back pain and softer blood pressure , she will need admission and further evaluation of pleural effusion to if larger and adequate for thoracentesis . Tried to do chest xray in office but patient was too weak.  Case discussed with Dr. Lamonte Sakai , patient to ER for admission . Call PCCM on arrival for pulmonary consult .  Tried to direct admit but no hospital beds per bed control   Plan  Go to ER for hospital admission .

## 2021-01-03 NOTE — ED Notes (Signed)
CP and nausea resolved, SOB remains the same, slightly worse than usual.

## 2021-01-03 NOTE — Progress Notes (Signed)
RT unable to get abg on pt. Pt did not tolerate the procedure.

## 2021-01-03 NOTE — Consult Note (Addendum)
NAME:  Heather [name], MRN:  [id number], DOB:  07-27-1926, LOS: 0 ADMISSION DATE:  01/03/2021, CONSULTATION DATE: 01/03/2021 REFERRING MD: Johnnette Gourd, DO, CHIEF COMPLAINT:   COPD exacerbation, hemoptysis  History of Present Illness:  87 year old with COPD, right middle lobe lung nodule suspicious for malignancy.  Seen in the office today with intermittent hemoptysis, worsening dyspnea.  Sent to ED for evaluation and management of COPD exacerbation  Previously seen in the office in 12/28/2020 and treated with doxycycline and prednisone.  She improved for a day or 2 but then got worse again. Complains of dyspnea, pain along the right rib and back.  Intermittent cough with white mucus.  She previously had intermittent hemoptysis which has resolved today.  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   COVID-pneumonia in 12/2018  Significant Hospital Events: Including procedures, antibiotic start and stop dates in addition to other pertinent events     Interim History / Subjective:    Objective   Blood pressure 136/76, pulse (!) 52, temperature 98.9 F (37.2 C), temperature source Oral, resp. rate (!) 48, SpO2 94 %.       No intake or output data in the 24 hours ending 01/03/21 2332 There were no vitals filed for this visit.  Examination: Gen:      No acute distress HEENT:  EOMI, sclera anicteric Neck:     No masses; no thyromegaly Lungs:     Clear to auscultation bilaterally; normal respiratory effort CV:         Regular rate and rhythm; no murmurs Abd:      + bowel sounds; soft, non-tender; no palpable masses, no distension Ext:    No edema; adequate peripheral perfusion Skin:      Warm and dry; no rash Neuro: alert and oriented x 3 Psych: normal mood and affect   Resolved Hospital Problem list     Assessment & Plan:  Acute on chronic respiratory failure, COPD exacerbation Did not improve with doxycycline and prednisone as an outpatient Continue IV Solu-Medrol, nebs There is concern for right-sided effusion but this seems small with chronic right-sided hemidiaphragm elevation and associated atelectasis.  Thoracentesis attempted in July without success due to lack of adequate fluid Get CT chest to reevaluate  Slow-growing right middle lobe nodule Concern for malignancy.  She is not a candidate for bronchoscopy given frailty Reevaluate on CT scan  Right rib pain Suspect respiratory pain due to coughing.  Low suspicion for PE as she is already on Eliquis Pain control  Best Practice (right click and "Reselect all SmartList Selections" daily)   Diet/type: Regular consistency (see orders) DVT prophylaxis: DOAC GI prophylaxis: N/A Lines: N/A Foley:  N/A Code Status:  full code Last date of multidisciplinary goals of care discussion []   Labs   CBC: Recent Labs  Lab 12/28/20 1142 01/03/21 1507  WBC 11.6* 11.6*  NEUTROABS 10,359* 10.4*  HGB 13.6 14.2  HCT 39.2 42.9  MCV 93.8 97.7  PLT 355 160    Basic Metabolic Panel: Recent Labs  Lab 01/03/21 1507  NA 140  K 3.5  CL 95*  CO2 32  GLUCOSE 260*  BUN 14  CREATININE 0.83  CALCIUM 9.0   GFR: Estimated Creatinine Clearance: 32.5 mL/min (by C-G formula based on SCr of 0.83 mg/dL). Recent Labs  Lab 12/28/20 1142 01/03/21 1507  WBC 11.6* 11.6*    Liver Function Tests: No results for input(s): AST, ALT, ALKPHOS, BILITOT, PROT, ALBUMIN in the last  168 hours. No results for input(s): LIPASE, AMYLASE in the last 168 hours. No results for input(s): AMMONIA in the last 168 hours.  ABG    Component Value Date/Time   HCO3 32.8 (H) 07/29/2020 1053   TCO2 36 (H) 02/27/2020 0912   O2SAT 74.6 07/29/2020 1053     Coagulation Profile: No results for input(s): INR, PROTIME in the last 168 hours.  Cardiac Enzymes: No results for input(s): CKTOTAL, CKMB, CKMBINDEX, TROPONINI in the last 168 hours.  HbA1C: Hgb A1c MFr Bld  Date/Time Value Ref Range Status  10/26/2020 01:18 PM 5.5 4.8 - 5.6 % Final    Comment:    (NOTE) Pre diabetes:          5.7%-6.4%  Diabetes:              >6.4%  Glycemic control for   <7.0% adults with diabetes     CBG: No results for input(s): GLUCAP in the last 168 hours.  Review of Systems:   REVIEW OF SYSTEMS:   All negative; except for those that are bolded, which indicate positives.  Constitutional: weight loss, weight gain, night sweats, fevers, chills, fatigue, weakness.  HEENT: headaches, sore throat, sneezing, nasal congestion, post nasal drip, difficulty swallowing, tooth/dental problems, visual complaints, visual changes, ear aches. Neuro: difficulty with speech, weakness, numbness, ataxia. CV:  chest pain, orthopnea, PND, swelling in lower extremities, dizziness, palpitations, syncope.  Resp: cough, hemoptysis, dyspnea, wheezing. GI: heartburn, indigestion, abdominal pain, nausea, vomiting, diarrhea, constipation, change in bowel habits, loss of appetite, hematemesis, melena, hematochezia.  GU: dysuria, change in color of urine, urgency or frequency, flank pain, hematuria. MSK: joint pain or swelling, decreased range of motion. Psych: change in mood or affect, depression, anxiety, suicidal ideations, homicidal ideations. Skin: rash, itching, bruising.   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   Surgical History:   Past Surgical History:  Procedure Laterality Date   APPENDECTOMY  1935   BACK SURGERY     x2   Cardiometablic Testing  12/14/3844   submaximal effort with peak RER of 0.5, peak VO2 79% predicted; HR peak up to 78%; PVC was 55% predicted, PEV1 39% predicted; PEV1/VC ratio was reduced; normal vital capacity; DLCO was reduced to 63%   CARPAL TUNNEL RELEASE     CATARACT EXTRACTION W/ INTRAOCULAR LENS  IMPLANT, BILATERAL     CHOLECYSTECTOMY  04/07/2014   dr Marlou Starks   CHOLECYSTECTOMY N/A 04/07/2014   Procedure: LAPAROSCOPIC CHOLECYSTECTOMY WITH INTRAOPERATIVE CHOLANGIOGRAM POSSBILE OPEN;  Surgeon: Autumn Messing III, MD;  Location: [facility];  Service: General;  Laterality: N/A;   COLON SURGERY  2008   partial   ESOPHAGOGASTRODUODENOSCOPY (EGD) WITH PROPOFOL N/A 05/25/2017   Procedure: ESOPHAGOGASTRODUODENOSCOPY (EGD) WITH PROPOFOL;  Surgeon: Clarene Essex, MD;  Location: [facility];  Service: Endoscopy;  Laterality: N/A;   HERNIA REPAIR     JOINT REPLACEMENT     left hip relacement  2000   SAVORY DILATION N/A 05/25/2017   Procedure: SAVORY DILATION;  Surgeon: Clarene Essex, MD;  Location: [facility];  Service: Endoscopy;  Laterality: N/A;   TONSILLECTOMY  1935   TOTAL ABDOMINAL HYSTERECTOMY  1970   TRANSTHORACIC ECHOCARDIOGRAM  05/2011   EF=>55%; mild conc LVH; mild mitral annular calcif; mild TR; AV mildly sclerotic & mild AR   VENTRAL HERNIA REPAIR  09/19/2011   Procedure: LAPAROSCOPIC VENTRAL HERNIA;  Surgeon: Merrie Roof, MD;   Location: [facility];  Service: General;  Laterality: N/A;  laparoscopic ventral hernia repair with mesh     Social History:   reports that she quit smoking about 42 years ago. Her smoking use included cigarettes. She started smoking about 62 years ago. She has a 20.00 pack-year smoking history. She has never used smokeless tobacco. She reports that she does not currently use alcohol. She reports that she does not use drugs.   Family History:  Her family history includes Bladder Cancer in her brother; Diabetes in her brother; Heart block in her brother; Stroke in her brother, father, and mother.   Allergies Allergies  Allergen Reactions   Sulfa Antibiotics     Other reaction(s): nausea & vomiting   Sulfamethoxazole-Trimethoprim     Other reaction(s): Unknown   Ace Inhibitors Cough   Elemental Sulfur Nausea And Vomiting     Home Medications  Prior to Admission medications   Medication Sig Start Date End Date Taking? Authorizing Provider  albuterol (PROVENTIL) (2.5 MG/3ML) 0.083% nebulizer solution Take 2.5 mg by nebulization 2 (two) times daily as needed for wheezing or shortness of breath.    [provider]  albuterol (VENTOLIN HFA) 108 (90 Base) MCG/ACT inhaler Inhale 2 puffs into the lungs every 4 (four) hours as needed for wheezing or shortness of breath. 08/21/20   Byrum, Rose Fillers, MD  apixaban (ELIQUIS) 2.5 MG TABS tablet Take 1 tablet (2.5 mg total) by mouth 2 (two) times daily. 08/14/20   Hilty, Nadean Corwin, MD  Budeson-Glycopyrrol-Formoterol (BREZTRI AEROSPHERE) 160-9-4.8 MCG/ACT AERO Take 2 puffs by mouth 2 (two) times daily.    [provider]  calcium citrate-vitamin D (CITRACAL+D) 315-200 MG-UNIT per tablet Take 1 tablet by mouth daily.    [provider]  Carboxymethylcellulose Sodium (THERATEARS OP) Place 2 drops into both eyes daily.     [provider]  diltiazem (CARDIZEM CD) 180 MG 24 hr capsule Take 180 mg by mouth daily.     [provider]  doxycycline (VIBRA-TABS) 100 MG tablet Take 1 tablet (100 mg total) by mouth 2 (two) times daily. 12/28/20   [name], NP  feeding supplement, ENSURE ENLIVE, (ENSURE ENLIVE) LIQD Take 237 mLs by mouth 2 (two) times daily between meals. 10/21/19   Manuella Ghazi, Pratik D, DO  fluticasone (FLONASE) 50 MCG/ACT nasal spray Place 2 sprays into both nostrils daily. 11/16/20   Rozetta Nunnery, MD  furosemide (LASIX) 40 MG tablet Take 1 tablet (40 mg total) by mouth daily. May take extra 20mg (60mg  am) for weight gain of 3#/overnight or 5#/weekly 11/28/20   Kroeger, Daleen Snook M., PA-C  guaiFENesin-dextromethorphan (ROBITUSSIN DM) 100-10 MG/5ML syrup Take 10 mLs by mouth every 6 (six) hours as needed for cough. 10/28/20   Aline August, MD  Lactobacillus Reuteri (BIOGAIA PROBIOTIC PO) Take 1 capsule by mouth daily.  [provider]  levothyroxine (SYNTHROID, LEVOTHROID) 137 MCG tablet Take 137 mcg by mouth daily before breakfast.     [provider]  magnesium oxide (MAG-OX) 400 MG tablet Take 400 mg by mouth daily.    [provider]  Multiple Vitamins-Minerals (MULTIVITAMIN WITH MINERALS) tablet Take 1 tablet by mouth daily.    [provider]  polyethylene glycol (MIRALAX / GLYCOLAX) packet Take 17 g by mouth daily.    [provider]  potassium chloride SA (KLOR-CON) 20 MEQ tablet Take 1 tablet (20 mEq total) by mouth daily. May take extra 20mg  (am) for weight gain 3#/daily or 5#/weekly 11/28/20   Kroeger, Daleen Snook M., PA-C  predniSONE (DELTASONE) 10 MG tablet 4 tabs for 2 days, then 3 tabs for 2 days, 2 tabs for 2 days, then 1 tab for 2 days, then stop 12/28/20   [name], NP     Critical care time: NA   Marshell Garfinkel MD [facility] See Amion for pager  If no response to pager , please call [phone] until 7pm After 7:00 pm call Elink  [phone] 01/03/2021, 11:50 PM

## 2021-01-03 NOTE — Telephone Encounter (Signed)
Appt has been made for 10/14. Call made to patient to see if she wanted to be seen sooner. Declined.   Nothing further needed at this time.

## 2021-01-03 NOTE — Progress Notes (Signed)
@Patient  ID: Heather [name], female    DOB: June 06, 1926, 87 y.o.   MRN: [id number]  Chief Complaint  Patient presents with   Follow-up    Referring provider: Burnard Bunting, MD  HPI: 87 year old female followed for severe COPD, suspicious slow growing lung nodule , pleural effusion  and oxygen dependent respiratory failure  Covid 19 pneumonitis in 12/2018.   TEST/EVENTS :  CT chest 07/29/2020 reviewed by me, shows emphysema, a focal lingular airspace consolidation that was most consistent with pneumonia, right hemidiaphragmatic elevation with some possible right lower lobe consolidation and airway impaction.  Her anterior right middle lobe nodule is increased in size to 3.2 x 2.3 cm with some associated calcification    01/03/2021 Follow up: COPD , O2 RF , Pleural effusion .  Patient returns for a work in visit.  Patient was seen last week for a COPD exacerbation with hemoptysis.  She was started on doxycycline and short prednisone burst.  Chest x-ray last week showed moderate pleural effusion and pleural thickening with associated consolidation Patient says that she started to feel better for just a day or 2 but has been getting progressively worse and this morning woke up with severe shortness of breath.  Having trouble talking that she so short of breath also has significant pain along the right ribs and back, Today in the office patient with mild respiratory distress having trouble speaking.  Says she is too weak to stand up.  Blood pressure is decreased 90/60.  She says her appetite has been down.  She denies any vomiting but says she is very sick on her stomach with significant nausea.  Patient remains on oxygen 2 L.  Has had no increased oxygen demands. She is had no further hemoptysis over the last couple days.  Patient does remain on Eliquis.  Patient has congestive heart failure.  Says she has chronic lower extremity swelling.  Has not no increase in leg swelling.  Weight has been  stable .   Patient was admitted in August with right-sided pleural effusion.  She was set up for a thoracentesis but was unable to complete by interventional radiologist due to lack of fluid. Patient has a known spiculated right middle lobe nodule that has been increasing in size.  Patient is considered not a surgical candidate.  Also has been too weak to consider SBRT as this is suspicious for underlying lung cancer.   Allergies  Allergen Reactions   Sulfa Antibiotics     Other reaction(s): nausea & vomiting   Sulfamethoxazole-Trimethoprim     Other reaction(s): Unknown   Ace Inhibitors Cough   Elemental Sulfur Nausea And Vomiting    Immunization History  Administered Date(s) Administered   Fluad Quad(high Dose 65+) 01/26/2020, 04/02/2020   Influenza Split 06/04/2011, 12/10/2011, 03/31/2012, 12/15/2013   Influenza, High Dose Seasonal PF 11/30/2018   Influenza, Quadrivalent, Recombinant, Inj, Pf 04/15/2018, 12/14/2018   Influenza,inj,Quad PF,6+ Mos 12/15/2013, 12/20/2014   Influenza-Unspecified 12/29/2013, 12/13/2015, 04/09/2017   PPD Test 05/13/2011   Pneumococcal Conjugate-13 06/27/2015   Pneumococcal Polysaccharide-23 04/25/2009, 04/08/2014, 01/26/2020   Tdap 06/04/2011, 06/26/2014   Zoster, Live 06/04/2011    Past Medical History:  Diagnosis Date   Anxiety    Arthritis    Atrial fibrillation (HCC)    Cerumen impaction    COPD (chronic obstructive pulmonary disease) (HCC)    Dysrhythmia    AF   Essential hypertension, benign    Gallstones    GERD (gastroesophageal reflux disease)  H/O hiatal hernia    History of nuclear stress test 10/2010   dipyridamole; normal pattern of perfusion; low risk, normal study   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    Mild aortic insufficiency    Osteoporosis    Peripheral neuropathy    Pneumonia    states she had it twice, last time a couple of months ago   PONV (postoperative nausea and vomiting)     Shortness of breath    with exertion   Unspecified hypothyroidism    Venous insufficiency     Tobacco History: Social History   Tobacco Use  Smoking Status Former   Packs/day: 1.00   Years: 20.00   Pack years: 20.00   Types: Cigarettes   Start date: 70   Quit date: 09/12/1978   Years since quitting: 42.3  Smokeless Tobacco Never   Counseling given: Not Answered   Outpatient Medications Prior to Visit  Medication Sig Dispense Refill   albuterol (PROVENTIL) (2.5 MG/3ML) 0.083% nebulizer solution Take 2.5 mg by nebulization 2 (two) times daily as needed for wheezing or shortness of breath.     albuterol (VENTOLIN HFA) 108 (90 Base) MCG/ACT inhaler Inhale 2 puffs into the lungs every 4 (four) hours as needed for wheezing or shortness of breath. 18 g 5   apixaban (ELIQUIS) 2.5 MG TABS tablet Take 1 tablet (2.5 mg total) by mouth 2 (two) times daily. 180 tablet 1   Budeson-Glycopyrrol-Formoterol (BREZTRI AEROSPHERE) 160-9-4.8 MCG/ACT AERO Take 2 puffs by mouth 2 (two) times daily.     calcium citrate-vitamin D (CITRACAL+D) 315-200 MG-UNIT per tablet Take 1 tablet by mouth daily.     Carboxymethylcellulose Sodium (THERATEARS OP) Place 2 drops into both eyes daily.      diltiazem (CARDIZEM CD) 180 MG 24 hr capsule Take 180 mg by mouth daily.      doxycycline (VIBRA-TABS) 100 MG tablet Take 1 tablet (100 mg total) by mouth 2 (two) times daily. 14 tablet 0   feeding supplement, ENSURE ENLIVE, (ENSURE ENLIVE) LIQD Take 237 mLs by mouth 2 (two) times daily between meals. 237 mL 12   fluticasone (FLONASE) 50 MCG/ACT nasal spray Place 2 sprays into both nostrils daily. 16 g 6   furosemide (LASIX) 40 MG tablet Take 1 tablet (40 mg total) by mouth daily. May take extra 20mg (60mg  am) for weight gain of 3#/overnight or 5#/weekly 40 tablet 6   guaiFENesin-dextromethorphan (ROBITUSSIN DM) 100-10 MG/5ML syrup Take 10 mLs by mouth every 6 (six) hours as needed for cough. 118 mL 1   Lactobacillus  Reuteri (BIOGAIA PROBIOTIC PO) Take 1 capsule by mouth daily.     levothyroxine (SYNTHROID, LEVOTHROID) 137 MCG tablet Take 137 mcg by mouth daily before breakfast.      magnesium oxide (MAG-OX) 400 MG tablet Take 400 mg by mouth daily.     Multiple Vitamins-Minerals (MULTIVITAMIN WITH MINERALS) tablet Take 1 tablet by mouth daily.     polyethylene glycol (MIRALAX / GLYCOLAX) packet Take 17 g by mouth daily.     potassium chloride SA (KLOR-CON) 20 MEQ tablet Take 1 tablet (20 mEq total) by mouth daily. May take extra 20mg  (am) for weight gain 3#/daily or 5#/weekly 40 tablet 6   predniSONE (DELTASONE) 10 MG tablet 4 tabs for 2 days, then 3 tabs for 2 days, 2 tabs for 2 days, then 1 tab for 2 days, then stop 20 tablet 0   No facility-administered medications prior to visit.     Review of Systems:  Constitutional:   No  weight loss, night sweats,  Fevers, chills,  +fatigue, or  lassitude.  HEENT:   No headaches,  Difficulty swallowing,  Tooth/dental problems, or  Sore throat,                No sneezing, itching, ear ache,  +nasal congestion, post nasal drip,   CV:  No chest pain,  Orthopnea, PND,  anasarca, dizziness, palpitations, syncope.   GI  No heartburn, indigestion, abdominal pain, nausea, vomiting, diarrhea, change in bowel habits, loss of appetite, bloody stools.   Resp: .  No chest wall deformity  Skin: no rash or lesions.  GU: no dysuria, change in color of urine, no urgency or frequency.  No flank pain, no hematuria   MS:  No joint pain or swelling.  No decreased range of motion.  No back pain.    Physical Exam  BP 90/60 (BP Location: Left Arm, Patient Position: Sitting, Cuff Size: Normal)   Pulse 98   Temp 98.3 F (36.8 C) (Oral)   Ht 5\' 3"  (1.6 m)   Wt 103 lb (46.7 kg)   SpO2 98%   BMI 18.25 kg/m   GEN: A/Ox3; pleasant , NAD, chronically ill-appearing, in wheelchair, on oxygen   HEENT:  /AT, NOSE-clear, THROAT-clear, no lesions, no postnasal drip or  exudate noted.   NECK:  Supple w/ fair ROM; no JVD; normal carotid impulses w/o bruits; no thyromegaly or nodules palpated; no lymphadenopathy.    RESP  few trace rhonchi , decreased BS in right base  no accessory muscle use, no dullness to percussion  CARD:  RRR, no m/r/g, 1+ peripheral edema, pulses intact, no cyanosis or clubbing.  GI:   Soft & nt; nml bowel sounds; no organomegaly or masses detected.   Musco: Warm bil, no deformities or joint swelling noted.   Neuro: alert, no focal deficits noted.    Skin: Warm, no lesions or rashes    Lab Results:  CBC    Component Value Date/Time   WBC 11.6 (H) 12/28/2020 1142   RBC 4.18 12/28/2020 1142   HGB 13.6 12/28/2020 1142   HCT 39.2 12/28/2020 1142   PLT 355 12/28/2020 1142   MCV 93.8 12/28/2020 1142   MCH 32.5 12/28/2020 1142   MCHC 34.7 12/28/2020 1142   RDW 13.0 12/28/2020 1142   LYMPHSABS 858 12/28/2020 1142   MONOABS 0.7 10/22/2020 1150   EOSABS 23 12/28/2020 1142   BASOSABS 46 12/28/2020 1142    BMET    Component Value Date/Time   NA 136 10/27/2020 0122   NA 143 02/12/2017 1216   K 4.4 10/27/2020 0122   CL 93 (L) 10/27/2020 0122   CO2 35 (H) 10/27/2020 0122   GLUCOSE 194 (H) 10/27/2020 0122   BUN 22 10/27/2020 0122   BUN 22 02/12/2017 1216   CREATININE 0.65 10/27/2020 0122   CALCIUM 8.7 (L) 10/27/2020 0122   GFRNONAA >60 10/27/2020 0122   GFRAA >60 10/20/2019 0220    BNP    Component Value Date/Time   BNP 62.2 10/22/2020 1150    ProBNP    Component Value Date/Time   PROBNP 121.6 02/20/2014 1435    Imaging: DG Chest 2 View  Result Date: 12/28/2020 CLINICAL DATA:  Hemoptysis, increased shortness of breath, COPD EXAM: CHEST - 2 VIEW COMPARISON:  10/27/2020 FINDINGS: No significant change in chest radiographs. Pulmonary hyperinflation and emphysema. Moderate right pleural effusion and/or pleural thickening with associated atelectasis or consolidation. Heart and mediastinum are normal  in size. Disc  degenerative disease of the thoracic spine with multiple wedge deformities. IMPRESSION: 1.  No significant change in chest radiographs. 2.  Pulmonary hyperinflation and emphysema. 3. Moderate right pleural effusion and/or pleural thickening with associated atelectasis or consolidation. Electronically Signed   By: Delanna Ahmadi M.D.   On: 12/28/2020 12:46      PFT Results Latest Ref Rng & Units 05/31/2013  FVC-Pre L 2.10  FVC-Predicted Pre % 91  FVC-Post L 2.15  FVC-Predicted Post % 93  Pre FEV1/FVC % % 48  Post FEV1/FCV % % 50  FEV1-Pre L 1.00  FEV1-Predicted Pre % 58  FEV1-Post L 1.08  DLCO uncorrected ml/min/mmHg 15.19  DLCO [facility]% % 66  DLVA Predicted % 75    No results found for: NITRICOXIDE      Assessment & Plan:   COPD (chronic obstructive pulmonary disease) (HCC) Severe COPD exacerbation-no significant improvement with progressive worsening despite antibiotics and steroids.  She also has an associated right-sided pleural effusion.  Underlying suspicious slow-growing pulmonary nodule for underlying lung cancer.  Patient continues to have failure to thrive with high symptom burden. Now with new onset back pain and softer blood pressure , she will need admission and further evaluation of pleural effusion to if larger and adequate for thoracentesis . Tried to do chest xray in office but patient was too weak.  Case discussed with Dr. Lamonte Sakai , patient to ER for admission . Call PCCM on arrival for pulmonary consult .  Tried to direct admit but no hospital beds per bed control   Plan  Go to ER for hospital admission .    I spent   44 minutes dedicated to the care of this patient on the date of this encounter to include pre-visit review of records, face-to-face time with the patient discussing conditions above, post visit ordering of testing, clinical documentation with the electronic health record, making appropriate referrals as documented, and communicating necessary findings to  members of the patients care team.    Rexene Edison, NP 01/03/2021

## 2021-01-03 NOTE — Patient Instructions (Signed)
Go to ER for hospital admit

## 2021-01-03 NOTE — ED Triage Notes (Signed)
Pt sent by cardiologist for further eval of chest pain and sob, ongoing for a while, wears 3L [place] at baseline. Pt talking in short sentences.

## 2021-01-03 NOTE — ED Provider Notes (Signed)
[facility] EMERGENCY DEPARTMENT Provider Note   CSN: [id number] Arrival date & time: 01/03/21  1322     History Chief Complaint  Patient presents with   Chest Pain   Shortness of Breath    Heather [name] is a 87 y.o. female with history of COPD, and atrial fibrillation on Eliquis, presents to the emergency department for shortness of breath right-sided chest pain this morning.  Patient states that she woke up and overall felt unwell, with associated weakness, nausea, and shortness of breath requiring increased oxygen.  She normally uses about 3 L of oxygen at baseline, 5 L with exertion.  She states she is also had pain in her right chest right side that was initially intermittent, but in the past several days has become more frequent and lasts for longer.  She states that this pain is worse with breathing.  She was seen on 9/30 by her pulmonologist for chest tightness and coughing up blood-tinged sputum. Her CXR moderate right pleural effusion which is stable from prior.  Started on steroids and doxycycline with concern for pneumonia.  Her last dose of antibiotics was today. She is no longer having hemoptysis.  She presented again to pulmonologist with mild respiratory distress and hypotension present to the emergency department for evaluation.   Chest Pain Associated symptoms: cough, nausea, shortness of breath and weakness   Associated symptoms: no abdominal pain, no fever, no headache and no vomiting   Shortness of Breath Associated symptoms: chest pain and cough   Associated symptoms: no abdominal pain, no fever, no headaches and no vomiting       Past Medical History:  Diagnosis Date   Anxiety    Arthritis    Atrial fibrillation (HCC)    Cerumen impaction    COPD (chronic obstructive pulmonary disease) (HCC)    Dysrhythmia    AF   Essential hypertension, benign    Gallstones    GERD (gastroesophageal reflux disease)    H/O hiatal hernia    History  of nuclear stress test 10/2010   dipyridamole; normal pattern of perfusion; low risk, normal study   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    Mild aortic insufficiency    Osteoporosis    Peripheral neuropathy    Pneumonia    states she had it twice, last time a couple of months ago   PONV (postoperative nausea and vomiting)    Shortness of breath    with exertion   Unspecified hypothyroidism    Venous insufficiency     Patient Active Problem List   Diagnosis Date Noted   Acute respiratory failure with hypoxia ([place]) 01/03/2021   Age-related osteoporosis with current pathological fracture 12/28/2020   Cor pulmonale ([place]) 12/28/2020   Hardening of the aorta (main artery of the heart) ([place]) 12/28/2020   Fall on same level 12/28/2020   Heart failure ([place]) 12/28/2020   Leukocytosis 12/28/2020   Vitamin D deficiency 12/28/2020   Cough with hemoptysis 12/28/2020   PNA (pneumonia) 10/22/2020   Pleural effusion on right 10/22/2020   Pleural effusion, right 10/22/2020   Elevated troponin 07/29/2020   History of rib fracture 07/29/2020   Pressure injury of skin 02/28/2020   Chronic respiratory failure with hypoxia ([place]) 02/22/2020   Acute on chronic respiratory failure with hypoxia ([place]) 10/18/2019   Encounter for general adult medical examination without abnormal findings 04/21/2019   Pulmonary nodule 03/29/2019   Adjustment disorder 01/24/2019   COVID-19 01/18/2019   Oxygen dependent  01/18/2019   Hypotension 01/05/2019   Varicose veins of lower extremity with inflammation 07/29/2018   Acute on chronic diastolic heart failure ([place]) 05/06/2018   Protein-calorie malnutrition, severe 04/26/2018   Backache 01/27/2018   Unspecified fracture of unspecified thoracic vertebra, subsequent encounter for fracture with delayed healing 01/27/2018   Disorder of pigmentation 05/28/2017   Localized edema 03/11/2017   Skin avulsion 09/19/2016   Influenza B  04/16/2016   Sepsis ([place]) 04/16/2016   Hypokalemia 04/16/2016   Hypothyroidism 04/16/2016   Hypo-osmolality and hyponatremia 06/27/2014   Post herpetic neuralgia 06/06/2014   Shingles outbreak 04/19/2014   Gallstones 09/20/2013   CAP (community acquired pneumonia) 05/17/2013   COPD (chronic obstructive pulmonary disease) ([place]) 05/17/2013   Dyspnea on exertion 04/25/2013   Pulmonary emphysema ([place]) 11/15/2012   HTN (hypertension) 11/15/2012   PAF (paroxysmal atrial fibrillation) ([place]) 06/14/2012   Long term current use of anticoagulant therapy 06/14/2012   Ventral hernia 07/31/2011   Abdominal wall mass 07/01/2011   Impaired fasting glucose 05/16/2009   Other disorders of intestinal carbohydrate absorption 05/16/2009   Polyneuropathy 05/16/2009   Age-related osteoporosis without current pathological fracture 04/25/2009    Past Surgical History:  Procedure Laterality Date   APPENDECTOMY  1935   BACK SURGERY     x2   Cardiometablic Testing  9/38/1017   submaximal effort with peak RER of 0.5, peak VO2 79% predicted; HR peak up to 78%; PVC was 55% predicted, PEV1 39% predicted; PEV1/VC ratio was reduced; normal vital capacity; DLCO was reduced to 63%   CARPAL TUNNEL RELEASE     CATARACT EXTRACTION W/ INTRAOCULAR LENS  IMPLANT, BILATERAL     CHOLECYSTECTOMY  04/07/2014   dr Marlou Starks   CHOLECYSTECTOMY N/A 04/07/2014   Procedure: LAPAROSCOPIC CHOLECYSTECTOMY WITH INTRAOPERATIVE CHOLANGIOGRAM POSSBILE OPEN;  Surgeon: Autumn Messing III, MD;  Location: [facility];  Service: General;  Laterality: N/A;   COLON SURGERY  2008   partial   ESOPHAGOGASTRODUODENOSCOPY (EGD) WITH PROPOFOL N/A 05/25/2017   Procedure: ESOPHAGOGASTRODUODENOSCOPY (EGD) WITH PROPOFOL;  Surgeon: Clarene Essex, MD;  [facility] END[facility]COPY;  Service: Endoscopy;  Laterality: N/A;   HERNIA REPAIR     JOINT REPLACEMENT     left hip relacement  2000   SAVORY DILATION N/A 05/25/2017   Procedure: SAVORY DILATION;  Surgeon: Clarene Essex, MD;   Location: [facility];  Service: Endoscopy;  Laterality: N/A;   TONSILLECTOMY  1935   TOTAL ABDOMINAL HYSTERECTOMY  1970   TRANSTHORACIC ECHOCARDIOGRAM  05/2011   EF=>55%; mild conc LVH; mild mitral annular calcif; mild TR; AV mildly sclerotic & mild AR   VENTRAL HERNIA REPAIR  09/19/2011   Procedure: LAPAROSCOPIC VENTRAL HERNIA;  Surgeon: Merrie Roof, MD;  Location: [facility];  Service: General;  Laterality: N/A;  laparoscopic ventral hernia repair with mesh     OB History   No obstetric history on file.     Family History  Problem Relation Age of Onset   Stroke Mother    Stroke Father    Heart block Brother    Bladder Cancer Brother    Diabetes Brother    Stroke Brother     Social History   Tobacco Use   Smoking status: Former    Packs/day: 1.00    Years: 20.00    Pack years: 20.00    Types: Cigarettes    Start date: 51    Quit date: 09/12/1978    Years since quitting: 42.3   Smokeless tobacco: Never  Vaping Use  Vaping Use: Never used  Substance Use Topics   Alcohol use: Not Currently    Alcohol/week: 0.0 standard drinks    Comment: occ   Drug use: No    Home Medications Prior to Admission medications   Medication Sig Start Date End Date Taking? Authorizing Provider  albuterol (PROVENTIL) (2.5 MG/3ML) 0.083% nebulizer solution Take 2.5 mg by nebulization 2 (two) times daily as needed for wheezing or shortness of breath.    [provider]  albuterol (VENTOLIN HFA) 108 (90 Base) MCG/ACT inhaler Inhale 2 puffs into the lungs every 4 (four) hours as needed for wheezing or shortness of breath. 08/21/20   Byrum, Rose Fillers, MD  apixaban (ELIQUIS) 2.5 MG TABS tablet Take 1 tablet (2.5 mg total) by mouth 2 (two) times daily. 08/14/20   Hilty, Nadean Corwin, MD  Budeson-Glycopyrrol-Formoterol (BREZTRI AEROSPHERE) 160-9-4.8 MCG/ACT AERO Take 2 puffs by mouth 2 (two) times daily.    [provider]  calcium citrate-vitamin D (CITRACAL+D) 315-200 MG-UNIT per  tablet Take 1 tablet by mouth daily.    [provider]  Carboxymethylcellulose Sodium (THERATEARS OP) Place 2 drops into both eyes daily.     [provider]  diltiazem (CARDIZEM CD) 180 MG 24 hr capsule Take 180 mg by mouth daily.     [provider]  doxycycline (VIBRA-TABS) 100 MG tablet Take 1 tablet (100 mg total) by mouth 2 (two) times daily. 12/28/20   [name], NP  feeding supplement, ENSURE ENLIVE, (ENSURE ENLIVE) LIQD Take 237 mLs by mouth 2 (two) times daily between meals. 10/21/19   Manuella Ghazi, Pratik D, DO  fluticasone (FLONASE) 50 MCG/ACT nasal spray Place 2 sprays into both nostrils daily. 11/16/20   Rozetta Nunnery, MD  furosemide (LASIX) 40 MG tablet Take 1 tablet (40 mg total) by mouth daily. May take extra 20mg (60mg  am) for weight gain of 3#/overnight or 5#/weekly 11/28/20   Kroeger, Daleen Snook M., PA-C  guaiFENesin-dextromethorphan (ROBITUSSIN DM) 100-10 MG/5ML syrup Take 10 mLs by mouth every 6 (six) hours as needed for cough. 10/28/20   Aline August, MD  Lactobacillus Reuteri (BIOGAIA PROBIOTIC PO) Take 1 capsule by mouth daily.    [provider]  levothyroxine (SYNTHROID, LEVOTHROID) 137 MCG tablet Take 137 mcg by mouth daily before breakfast.     [provider]  magnesium oxide (MAG-OX) 400 MG tablet Take 400 mg by mouth daily.    [provider]  Multiple Vitamins-Minerals (MULTIVITAMIN WITH MINERALS) tablet Take 1 tablet by mouth daily.    [provider]  polyethylene glycol (MIRALAX / GLYCOLAX) packet Take 17 g by mouth daily.    [provider]  potassium chloride SA (KLOR-CON) 20 MEQ tablet Take 1 tablet (20 mEq total) by mouth daily. May take extra 20mg  (am) for weight gain 3#/daily or 5#/weekly 11/28/20   Kroeger, Lorelee Cover., PA-C  predniSONE (DELTASONE) 10 MG tablet 4 tabs for 2 days, then 3 tabs for 2 days, 2 tabs for 2 days, then 1 tab for 2 days, then stop 12/28/20   [name], NP     Allergies    Sulfa antibiotics, Sulfamethoxazole-trimethoprim, Ace inhibitors, and Elemental sulfur  Review of Systems   Review of Systems  Constitutional:  Negative for chills and fever.  HENT:  Negative for congestion.   Respiratory:  Positive for cough, chest tightness and shortness of breath.   Cardiovascular:  Positive for chest pain.  Gastrointestinal:  Positive for nausea. Negative for abdominal pain, constipation, diarrhea  and vomiting.  Neurological:  Positive for weakness. Negative for light-headedness and headaches.  All other systems reviewed and are negative.  Physical Exam Updated Vital Signs BP 136/76   Pulse (!) 52   Temp 98.9 F (37.2 C) (Oral)   Resp (!) 48   SpO2 94%   Physical Exam Vitals and nursing note reviewed.  Constitutional:      Appearance: Normal appearance.  HENT:     Head: Normocephalic and atraumatic.  Eyes:     Conjunctiva/sclera: Conjunctivae normal.  Cardiovascular:     Rate and Rhythm: Normal rate and regular rhythm.  Pulmonary:     Effort: Pulmonary effort is normal. No respiratory distress.     Breath sounds: Normal breath sounds.     Comments: Lung sounds clear to auscultation bilaterally, with decreased air movement at the bases worse on the right side compared to the left.  No tenderness to palpation of chest wall. Abdominal:     General: There is no distension.     Palpations: Abdomen is soft.     Tenderness: There is no abdominal tenderness.  Musculoskeletal:     Right lower leg: No edema.     Left lower leg: No edema.  Skin:    General: Skin is warm and dry.  Neurological:     General: No focal deficit present.     Mental Status: She is alert.    ED Results / Procedures / Treatments   Labs (all labs ordered are listed, but only abnormal results are displayed) Labs Reviewed  CBC WITH DIFFERENTIAL/PLATELET - Abnormal; Notable for the following components:      Result Value   WBC 11.6 (*)    Neutro Abs 10.4 (*)     Abs Immature Granulocytes 0.09 (*)    All other components within normal limits  BASIC METABOLIC PANEL - Abnormal; Notable for the following components:   Chloride 95 (*)    Glucose, Bld 260 (*)    All other components within normal limits  BRAIN NATRIURETIC PEPTIDE - Abnormal; Notable for the following components:   B Natriuretic Peptide 107.0 (*)    All other components within normal limits  RESP PANEL BY RT-PCR (FLU A&B, COVID) ARPGX2  BLOOD GAS, ARTERIAL  MAGNESIUM  CBC  BASIC METABOLIC PANEL  TROPONIN I (HIGH SENSITIVITY)  TROPONIN I (HIGH SENSITIVITY)    EKG EKG Interpretation  Date/Time:  Thursday January 03 2021 14:07:59 EDT Ventricular Rate:  100 PR Interval:  142 QRS Duration: 72 QT Interval:  380 QTC Calculation: 490 R Axis:   36 Text Interpretation: Sinus rhythm with frequent Premature ventricular complexes and Fusion complexes Prolonged QT Abnormal ECG Confirmed by Octaviano Glow [id number]) on 01/03/2021 8:14:28 PM  Radiology DG Chest 2 View  Result Date: 01/03/2021 CLINICAL DATA:  Shortness of breath. EXAM: CHEST - 2 VIEW COMPARISON:  December 28, 2020. FINDINGS: The heart size and mediastinal contours are within normal limits. No pneumothorax is noted. Mild right pleural effusion is noted with associated right basilar atelectasis or infiltrate. Left lung is clear. Multiple old thoracic compression fractures are noted. IMPRESSION: Mild right pleural effusion with associated right basilar atelectasis or infiltrate. Aortic Atherosclerosis (ICD10-I70.0). Electronically Signed   By: Marijo Conception M.D.   On: 01/03/2021 16:20    Procedures Procedures   Medications Ordered in ED Medications  diltiazem (CARDIZEM CD) 24 hr capsule 180 mg (has no administration in time range)  levothyroxine (SYNTHROID) tablet 137 mcg (has no administration in time range)  apixaban (ELIQUIS) tablet 2.5 mg (has no administration in time range)  acetaminophen (TYLENOL) tablet 650 mg  (has no administration in time range)    Or  acetaminophen (TYLENOL) suppository 650 mg (has no administration in time range)  furosemide (LASIX) injection 40 mg (has no administration in time range)  ipratropium-albuterol (DUONEB) 0.5-2.5 (3) MG/3ML nebulizer solution 3 mL (has no administration in time range)  methylPREDNISolone sodium succinate (SOLU-MEDROL) 40 mg/mL injection 40 mg (has no administration in time range)  fluticasone furoate-vilanterol (BREO ELLIPTA) 200-25 MCG/INH 1 puff (has no administration in time range)    And  umeclidinium bromide (INCRUSE ELLIPTA) 62.5 MCG/INH 1 puff (has no administration in time range)  ipratropium-albuterol (DUONEB) 0.5-2.5 (3) MG/3ML nebulizer solution 3 mL (3 mLs Nebulization Given 01/03/21 2008)  methylPREDNISolone sodium succinate (SOLU-MEDROL) 40 mg/mL injection 40 mg (40 mg Intravenous Given 01/03/21 2001)  albuterol (PROVENTIL) (2.5 MG/3ML) 0.083% nebulizer solution (10 mg/hr Nebulization Given 01/03/21 2056)    ED Course  I have reviewed the triage vital signs and the nursing notes.  Pertinent labs & imaging results that were available during my care of the patient were reviewed by me and considered in my medical decision making (see chart for details).  Clinical Course as of 01/03/21 2337  Thu Jan 03, 2021  2145 This is a 87 year old with history of COPD on 2 to 3 L nasal cannula at baseline, frequent hospitalization of COPD, heart failure, presented ED with shortness of breath.  She is being treated [place] as an outpatient for pneumonia and is on her last day of doxycycline.  She has been also on a prednisone burst although she takes 10 mg daily, and has been using breathing treatments.  She and her daughter report that she woke up with shortness of breath this morning that was worse than normal.  He feels like her COPD flareups.  Typically she has required hospitalization for several days until she can turn the corner.  On exam she is on  3L [place], she does have very poor air movement bilaterally, some crackles in the right lower lobe, chest x-ray was reviewed and showing some mild right-sided pleural effusion and possible atelectasis, she appears to be chronic.  She does report a worsening cough.  She has a minor leukocytosis which may be due to the steroids.  Plan to continue breathing treatments patient would likely need admission given her age and risk factors for monitoring overnight.  The patient and her daughter are in agreement with the plan.  Otherwise she is stable from respiratory standpoint and not requiring BiPAP or intubation [MT]    Clinical Course User Index [MT] Trifan, Carola Rhine, MD   MDM Rules/Calculators/A&P                           Patient is 87 year old female with history of COPD who presents to the emergency department for shortness of breath and right-sided chest pain. She has had increased oxygen demand, weakness and fatigue since this morning.  She was seen last week by her pulmonologist after she was coughing up blood, they had concern for pneumonia and started on doxycycline and a steroid taper.  Hemoptysis has resolved, her last dose of antibiotics was this morning.  Previously she has required admission for COPD exacerbations, most recently in July.   On exam she is afebrile and is oxygen saturation is 98% on 3 L which is her baseline, but she continues to  feel short of breath with increased respiratory effort with decreased air movement in all fields. CXR shows mild right pleural effusion unchanged from prior. Started on steroids and nebulizers. COVID test negative.  9:25pm Consulted hospitalist Dr. Heber [place] for admission who accepted patient. The patient appears reasonably stabilized for admission considering the current resources, flow, and capabilities available in the ED at this time, and I doubt any other [facility] requiring further screening and/or treatment in the ED prior to admission.  Final Clinical  Impression(s) / ED Diagnoses Final diagnoses:  COPD exacerbation [facility])    Rx / DC Orders ED Discharge Orders     None        Kateri Plummer, PA-C 01/03/21 2337    Wyvonnia Dusky, MD 01/04/21 1121

## 2021-01-04 ENCOUNTER — Inpatient Hospital Stay (HOSPITAL_COMMUNITY): Payer: Medicare HMO

## 2021-01-04 ENCOUNTER — Encounter (HOSPITAL_COMMUNITY): Payer: Self-pay | Admitting: Internal Medicine

## 2021-01-04 ENCOUNTER — Encounter (HOSPITAL_COMMUNITY)
Admission: EM | Disposition: A | Discharge status: Home / Self Care | Payer: Self-pay | Source: Home / Self Care | Attending: Internal Medicine

## 2021-01-04 DIAGNOSIS — J9601 Acute respiratory failure with hypoxia: Secondary | ICD-10-CM | POA: Diagnosis not present

## 2021-01-04 HISTORY — PX: THORACENTESIS: SHX235

## 2021-01-04 LAB — BASIC METABOLIC PANEL
Anion gap: 12 (ref 5–15)
BUN: 17 mg/dL (ref 8–23)
CO2: 33 mmol/L — ABNORMAL HIGH (ref 22–32)
Calcium: 8.6 mg/dL — ABNORMAL LOW (ref 8.9–10.3)
Chloride: 93 mmol/L — ABNORMAL LOW (ref 98–111)
Creatinine, Ser: 0.84 mg/dL (ref 0.44–1.00)
GFR, Estimated: >60 mL/min (ref >60)
Glucose, Bld: 312 mg/dL — ABNORMAL HIGH (ref 70–99)
Potassium: 3 mmol/L — ABNORMAL LOW (ref 3.5–5.1)
Sodium: 138 mmol/L (ref 135–145)

## 2021-01-04 LAB — CBC
HCT: 39.3 % (ref 36.0–46.0)
Hemoglobin: 13.3 g/dL (ref 12.0–15.0)
MCH: 32.2 pg (ref 26.0–34.0)
MCHC: 33.8 g/dL (ref 30.0–36.0)
MCV: 95.2 fL (ref 80.0–100.0)
Platelets: 358 10*3/uL (ref 150–400)
RBC: 4.13 MIL/uL (ref 3.87–5.11)
RDW: 14.1 % (ref 11.5–15.5)
WBC: 9.7 10*3/uL (ref 4.0–10.5)
nRBC: 0 % (ref 0.0–0.2)

## 2021-01-04 LAB — BODY FLUID CELL COUNT WITH DIFFERENTIAL
Eos, Fluid: 0 %
Lymphs, Fluid: 94 %
Monocyte-Macrophage-Serous Fluid: 0 % — ABNORMAL LOW (ref 50–90)
Neutrophil Count, Fluid: 6 % (ref 0–25)
Total Nucleated Cell Count, Fluid: 4434 cu mm — ABNORMAL HIGH (ref 0–1000)

## 2021-01-04 LAB — LACTATE DEHYDROGENASE, PLEURAL OR PERITONEAL FLUID: LD, Fluid: 215 U/L — ABNORMAL HIGH (ref 3–23)

## 2021-01-04 LAB — GLUCOSE, CAPILLARY
Glucose-Capillary: 207 mg/dL — ABNORMAL HIGH (ref 70–99)
Glucose-Capillary: 157 mg/dL — ABNORMAL HIGH (ref 70–99)
Glucose-Capillary: 158 mg/dL — ABNORMAL HIGH (ref 70–99)

## 2021-01-04 LAB — MAGNESIUM: Magnesium: 1.8 mg/dL (ref 1.7–2.4)

## 2021-01-04 LAB — GLUCOSE, PLEURAL OR PERITONEAL FLUID: Glucose, Fluid: 231 mg/dL

## 2021-01-04 LAB — TSH: TSH: 0.312 u[IU]/mL — ABNORMAL LOW (ref 0.350–4.500)

## 2021-01-04 SURGERY — THORACENTESIS
Anesthesia: Topical

## 2021-01-04 MED ORDER — POTASSIUM CHLORIDE 20 MEQ PO PACK
40.0000 meq | PACK | Freq: Two times a day (BID) | ORAL | Status: AC
  Administered 2021-01-04 (×2): 40 meq via ORAL
  Filled 2021-01-04 (×2): qty 2

## 2021-01-04 MED ORDER — METHYLPREDNISOLONE SODIUM SUCC 40 MG IJ SOLR: 20.0000 mg | Freq: Two times a day (BID) | INTRAMUSCULAR | Status: DC

## 2021-01-04 MED ORDER — PREDNISONE 20 MG PO TABS
20.0000 mg | ORAL_TABLET | Freq: Every day | ORAL | Status: DC
  Administered 2021-01-05 – 2021-01-06 (×2): 20 mg via ORAL
  Filled 2021-01-04 (×2): qty 1

## 2021-01-04 MED ORDER — GUAIFENESIN-DM 100-10 MG/5ML PO SYRP: 5.0000 mL | ORAL_SOLUTION | ORAL | Status: DC | PRN

## 2021-01-04 MED ORDER — POTASSIUM CHLORIDE 20 MEQ PO PACK: 40.0000 meq | PACK | Freq: Two times a day (BID) | ORAL | Status: DC

## 2021-01-04 NOTE — Op Note (Addendum)
Thoracentesis  Procedure Note  Heather [name]  [id number]  Apr 22, 1926  Date:01/04/21  Time:3:14 PM   Provider Performing:Brent Keithon Mccoin   Procedure: Thoracentesis with imaging guidance (10175)  Indication(s) Pleural Effusion  Consent Risks of the procedure as well as the alternatives and risks of each were explained to the patient and/or caregiver.  Consent for the procedure was obtained and is signed in the bedside chart  Anesthesia Topical only with 1% lidocaine    Time Out Verified patient identification, verified procedure, site/side was marked, verified correct patient position, special equipment/implants available, medications/allergies/relevant history reviewed, required imaging and test results available.   Sterile Technique Maximal sterile technique including full sterile barrier drape, hand hygiene, sterile gown, sterile gloves, mask, hair covering, sterile ultrasound probe cover (if used).  Procedure Description Ultrasound was used to identify appropriate pleural anatomy for placement and overlying skin marked.  Area of drainage cleaned and draped in sterile fashion. Lidocaine was used to anesthetize the skin and subcutaneous tissue.  30 cc's of serous appearing fluid was drained from the right pleural space. Catheter then removed and bandaid applied to site.     Complications/Tolerance None; patient tolerated the procedure well. Chest X-ray is ordered to confirm no post-procedural complication.   EBL Minimal   Specimen(s) Pleural fluid  Roselie Awkward, MD [facility] Pager: [phone] Cell: [phone] After 7:00 pm call Elink  [phone]

## 2021-01-04 NOTE — Progress Notes (Signed)
[facility]  Thoracentesis performed today Sent for culture, cytology, other routine pleural studies From my standpoint could be d/c'd over weekend with 5 days of antibiotics and 5 days of prednisone 20mg  daily (she has improved with IV treament overnight) Will arrange f/u in pulmonary clinic middle of next week.  Roselie Awkward, MD [facility] Pager: [phone] Cell: [phone] After 7:00 pm call Elink  [phone]

## 2021-01-04 NOTE — Hospital Course (Signed)
Acute respiratory failure with hypoxia COPD exacerbation Patient presented to the ED on 10/6 with complaints of dyspnea, chest tightness, coughing with pink-colored sputum 1 week ago for which she was seen by her pulmonologist.  She states that she is on 3 L oxygen at home at rest, and she is on 5 L oxygen with exertion.  Her pulmonologist placed her on doxycycline and prednisone for COPD exacerbation. she initially improved, however she has had worsening of her symptoms prompting her to present back to her pulmonologist, who recommended further evaluation in the ED.  Chest x-ray in the ED did not show evidence of pneumonia. Low suspicion for PE when she is adherent to her Eliquis. In the ED, she received nebulizer treatment and IV Solu-Medrol, and pulmonology was consulted.  When she was admitted to her service, we continued her inhaler, nebulizer,***. ***On 10/7, patient was satting well on 3 L and states that her shortness of breath has slightly improved.  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  CT imaging demonstrates that tumor is increased in size from 3.3 to 3.9 cm.  Right pleural effusion also increased since prior.  She is not a candidate for bronchoscopy due to multiple comorbidities.  Analogy recommended thoracentesis, which was performed on 10 7 ***   Right-sided chest pain, back pain and side pain Her pain is noncardiac and atypical.  ACS was ruled out with flat troponin and unremarkable EKG. She does have history of right rib fracture with notable thoracic compression fracture, which could be related to her pain.  Currently denies any pain.  Her pain was managed with Tylenol and lidocaine patch as needed.   HFpEF Echocardiogram in 01/2020 showed normal EF.  She is taking Lasix 40 mg at home.   Reports stable weight.  BNP is only mildly elevated.  Was given 1 dose of IV Lasix 40 mg on 10/6.   Persistent A. fib Resumed diltiazem and Eliquis regimens in the ED.  Eliquis was held prior to her thoracentesis on 10/7 and was resumed on***   Hypothyroidism TSH of 0.312.  She was on her levothyroxine regimen during her hospitalization.

## 2021-01-04 NOTE — Progress Notes (Signed)
NAME:  Heather [name], MRN:  [id number], DOB:  11/19/1926, LOS: 1 ADMISSION DATE:  01/03/2021, CONSULTATION DATE:  10/6 REFERRING MD:  Heber Leon, CHIEF COMPLAINT:  dyspnea, hemoptysis   History of Present Illness:  87 y/o female seen by pulmonary in the past for dyspnea, hemoptysis, COPD, a lung nodule, and a right pleural effusion who presented to the [facility] ED with dyspnea from a COPD exacerbation.  Lung nodule in RML and right pleural effusion are bigger.  Pertinent  Medical History  Centrilobular emphysema, atrial fibrillation, hiatal hernia, GERD, gallstones, aortic insufficiency, osteoporosis, peripheral neuropathy, pneumonia, hypothyroidism, covid pneumonia 12/2018  Significant Hospital Events: Including procedures, antibiotic start and stop dates in addition to other pertinent events   10/6 admission for COPD exacerbation  Imaging: 10/7 CT chest > personally reviewed, large calcified pulmonary nodule in the right middle lobe increased in size compared to prior with increased size right pleural effusion with likely loculated component  Interim History / Subjective:  Breathing has improved Has some chest pain in the right chest, has been there for weeks Coughed up a small amount of blood thi morning  Objective   Blood pressure 124/76, pulse 100, temperature (!) 97.5 F (36.4 C), temperature source Oral, resp. rate 20, weight 46.7 kg, SpO2 100 %.       No intake or output data in the 24 hours ending 01/04/21 0843 Filed Weights   01/04/21 0228  Weight: 46.7 kg    Examination: General:  Frail, elderly female resting comfortably in bed HENT: NCAT OP clear PULM: Diminished R base, otherwise clear, normal effort CV: Irreg irreg, no mgr GI: BS+, soft, nontender MSK: normal bulk and tone Neuro: awake, alert, no distress, MAEW  Bedside ultrasound of right chest performed 01/04/2021    Resolved Hospital Problem list     Assessment & Plan:  Acute exacerbation of COPD,  improved > could change to prednisone 20 mg daily at this point > continue Breo, incruise  Right middle lobe mass with pleural effusion> both increased in size.  Could be chronic inflammation from aspiration vs malignancy.  High risk for bleeding from thoracentesis, but the effusion is bigger now than when attempted previously.  We discussed the risks and benefits and she would like to proceed with a thoracentesis first to evaluate because she wants to know what is going on. > plan thoracentesis today at 2:30 > hold eliquis  Rest per primary  Best Practice (right click and "Reselect all SmartList Selections" daily)   Per primary  Labs   CBC: Recent Labs  Lab 12/28/20 1142 01/03/21 1507 01/04/21 0320  WBC 11.6* 11.6* 9.7  NEUTROABS 10,359* 10.4*  --   HGB 13.6 14.2 13.3  HCT 39.2 42.9 39.3  MCV 93.8 97.7 95.2  PLT 355 391 902    Basic Metabolic Panel: Recent Labs  Lab 01/03/21 1507 01/04/21 0320  NA 140 138  K 3.5 3.0*  CL 95* 93*  CO2 32 33*  GLUCOSE 260* 312*  BUN 14 17  CREATININE 0.83 0.84  CALCIUM 9.0 8.6*  MG  --  1.8   GFR: Estimated Creatinine Clearance: 32.2 mL/min (by C-G formula based on SCr of 0.84 mg/dL). Recent Labs  Lab 12/28/20 1142 01/03/21 1507 01/04/21 0320  WBC 11.6* 11.6* 9.7    Liver Function Tests: No results for input(s): AST, ALT, ALKPHOS, BILITOT, PROT, ALBUMIN in the last 168 hours. No results for input(s): LIPASE, AMYLASE in the last 168 hours. No results for input(s): AMMONIA  in the last 168 hours.  ABG    Component Value Date/Time   HCO3 32.8 (H) 07/29/2020 1053   TCO2 36 (H) 02/27/2020 0912   O2SAT 74.6 07/29/2020 1053     Coagulation Profile: No results for input(s): INR, PROTIME in the last 168 hours.  Cardiac Enzymes: No results for input(s): CKTOTAL, CKMB, CKMBINDEX, TROPONINI in the last 168 hours.  HbA1C: Hgb A1c MFr Bld  Date/Time Value Ref Range Status  10/26/2020 01:18 PM 5.5 4.8 - 5.6 % Final     Comment:    (NOTE) Pre diabetes:          5.7%-6.4%  Diabetes:              >6.4%  Glycemic control for   <7.0% adults with diabetes     CBG: No results for input(s): GLUCAP in the last 168 hours.  Roselie Awkward, MD  PCCM Pager: [phone] Cell: [phone] After 7:00 pm call Elink  [phone]

## 2021-01-04 NOTE — Progress Notes (Signed)
   01/04/21 1120  Mobility  Activity Contraindicated/medical hold   Spoke w/ RN who requested for mobility to hold off d/t Major SOB for bed mobility and minor episode of hemoptysis.

## 2021-01-04 NOTE — Progress Notes (Signed)
Inpatient Diabetes Program Recommendations  AACE/ADA: New Consensus Statement on Inpatient Glycemic Control (2015)  Target Ranges:  Prepandial:   less than 140 mg/dL      Peak postprandial:   less than 180 mg/dL (1-2 hours)      Critically ill patients:  140 - 180 mg/dL   Lab Results  Component Value Date   GLUCAP 193 (H) 10/28/2020   HGBA1C 5.5 10/26/2020    Review of Glycemic Control  Diabetes history: No hx DM  Inpatient Diabetes Program Recommendations:   While on steroids, please consider: -Novolog 0-6 units tid Secure chat sent to Dr. Lorin Glass.  Thank you, Nani Gasser. Monisha Siebel, RN, MSN, CDE  Diabetes Coordinator Inpatient Glycemic Control Team Team Pager [phone] (8am-5pm) 01/04/2021 11:32 AM

## 2021-01-04 NOTE — Progress Notes (Addendum)
Subjective: The patient was seen at bedside rounds this AM.  The cough she has been having has been about the same, stating that it is mostly clear but she has noticed pink tinge to it this morning.  Her shortness of breath has improved somewhat, however she does continue to have some noticeable shortness of breath with exertion, including while talking to [place] in the room.  He feels that the Lasix has not helped much to improve her shortness of breath.  No other complaints or concerns today.  Objective:  Vital signs in last 24 hours: Vitals:   01/04/21 0223 01/04/21 0228 01/04/21 0412 01/04/21 0812  BP: 134/79  136/70 124/76  Pulse: (!) 109  (!) 102 100  Resp: (!) 30   20  Temp: 97.9 F (36.6 C)  98 F (36.7 C) (!) 97.5 F (36.4 C)  TempSrc: Oral  Oral Oral  SpO2: 100%  100% 100%  Weight:  46.7 kg      General: thin elderly female, with some shortness of breath while speaking to [place] but otherwise NAD.  Satting well on 3 L in the room. HE: Normocephalic, atraumatic , EOMI, Conjunctivae normal ENT: No congestion, no rhinorrhea, no exudate or erythema  Cardiovascular: Tachycardia, regular rhythm.  No murmurs, rubs, or gallops Pulmonary : Effort normal.  Crackles, diminished breath sounds at RLL base.  Left side unremarkable. Abdominal: soft, nontender,  bowel sounds present Musculoskeletal: no swelling , deformity, injury ,or tenderness in extremities, 2+ BLE edema Skin: Warm, dry , no bruising, erythema, or rash Psychiatric/Behavioral:  normal mood, normal behavior    Assessment/Plan:  Principal Problem:   Acute respiratory failure with hypoxia (HCC) Active Problems:   Atrial fibrillation (HCC)   COPD (chronic obstructive pulmonary disease) (HCC)   Pulmonary nodule   Pleural effusion, right  Acute respiratory failure with hypoxia COPD exacerbation Her worsening dyspnea on exertion is likely due to COPD exacerbation and worsening of right pleural effusion.  Since admission to  the ED, she has been on IV Solu-Medrol, duo nebs, Breo and Incruse Ellipta.  On the room, we turned patient's oxygen down from 5 L to 3 L, and she was satting well at 97%. Pulmonology team is on board and will proceed with thoracentesis this afternoon. -Appreciate pulmonology recommendations -Continue oxygen, only at 3 L [place] -Holding Eliquis in the setting of thoracentesis today -Transitioned to prednisone 20 mg po daily per pulm recs -DuoNebs as needed -Breo and Incruse Ellipta daily   Right pleural effusion Right pulmonary nodule Her effusion is likely malignant secondary to the right middle lobe mass, could also be chronic inflammation from aspiration.  Tumor is increased in size from 3.3 to 3.9 cm.  Right pleural effusion also increased since prior.  She is not a candidate for bronchoscopy due to multiple comorbidities. We will proceed with thoracentesis this afternoon by the pulmonology team.   Right-sided chest pain, back pain and side pain Her pain is noncardiac and atypical.  ACS was ruled out with flat troponin and unremarkable EKG. She does have history of right rib fracture with notable thoracic compression fracture, which could be related to her pain.  Currently denies any pain. -Tylenol as needed -Lidocaine patch if needed   HFpEF Echocardiogram in 01/2020 showed normal EF.  She is taking Lasix 40 mg at home.  Reports stable weight. BNP is only mildly elevated. -s/p 1 dose of IV Lasix 40 mg for her LE edema and right pleural effusion   Persistent  A. fib -Continue diltiazem 180 mg p.o. daily -Holding Eliquis in the setting of thoracentesis today   Hypothyroidism Patient is on levothyroxine at home. TSH 0.312 -Continue levothyroxine 137 mcg p.o. daily   Prior to Admission Living Arrangement: Home Anticipated Discharge Location: Home Barriers to Discharge: Dispo: Anticipated discharge pending work-up for her right pleural effusion  Orvis Brill, MD 01/04/2021, 8:30  AM Pager: [phone] After 5pm on weekdays and 1pm on weekends: On Call pager [phone]

## 2021-01-05 DIAGNOSIS — J9601 Acute respiratory failure with hypoxia: Secondary | ICD-10-CM | POA: Diagnosis not present

## 2021-01-05 LAB — CBC
HCT: 41.2 % (ref 36.0–46.0)
Hemoglobin: 13.7 g/dL (ref 12.0–15.0)
MCH: 32.5 pg (ref 26.0–34.0)
MCHC: 33.3 g/dL (ref 30.0–36.0)
MCV: 97.6 fL (ref 80.0–100.0)
Platelets: 351 10*3/uL (ref 150–400)
RBC: 4.22 MIL/uL (ref 3.87–5.11)
RDW: 13.9 % (ref 11.5–15.5)
WBC: 13.3 10*3/uL — ABNORMAL HIGH (ref 4.0–10.5)
nRBC: 0 % (ref 0.0–0.2)

## 2021-01-05 LAB — BASIC METABOLIC PANEL
Anion gap: 7 (ref 5–15)
BUN: 19 mg/dL (ref 8–23)
CO2: 35 mmol/L — ABNORMAL HIGH (ref 22–32)
Calcium: 9 mg/dL (ref 8.9–10.3)
Chloride: 97 mmol/L — ABNORMAL LOW (ref 98–111)
Creatinine, Ser: 0.7 mg/dL (ref 0.44–1.00)
GFR, Estimated: >60 mL/min (ref >60)
Glucose, Bld: 126 mg/dL — ABNORMAL HIGH (ref 70–99)
Potassium: 4.3 mmol/L (ref 3.5–5.1)
Sodium: 139 mmol/L (ref 135–145)

## 2021-01-05 LAB — GLUCOSE, CAPILLARY
Glucose-Capillary: 137 mg/dL — ABNORMAL HIGH (ref 70–99)
Glucose-Capillary: 178 mg/dL — ABNORMAL HIGH (ref 70–99)
Glucose-Capillary: 190 mg/dL — ABNORMAL HIGH (ref 70–99)
Glucose-Capillary: 119 mg/dL — ABNORMAL HIGH (ref 70–99)

## 2021-01-05 MED ORDER — DOXYCYCLINE HYCLATE 100 MG PO TABS
100.0000 mg | ORAL_TABLET | Freq: Two times a day (BID) | ORAL | Status: DC
  Administered 2021-01-05 – 2021-01-06 (×3): 100 mg via ORAL
  Filled 2021-01-05 (×3): qty 1

## 2021-01-05 MED ORDER — APIXABAN 2.5 MG PO TABS
2.5000 mg | ORAL_TABLET | Freq: Two times a day (BID) | ORAL | Status: DC
  Administered 2021-01-05 – 2021-01-06 (×2): 2.5 mg via ORAL
  Filled 2021-01-05 (×2): qty 1

## 2021-01-05 NOTE — Progress Notes (Signed)
Subjective: Patient evaluated bedside.  Notes improvement in her shortness of breath.  Still having shortness of breath with speech but much better than yesterday.  She feels she will be ready for discharge tomorrow.  Objective:  Vital signs in last 24 hours: Vitals:   01/04/21 1636 01/04/21 2032 01/05/21 0013 01/05/21 0417  BP: 106/63 (!) 127/57 123/86 125/75  Pulse: 87 92 81 80  Resp: 19 19 19 19   Temp: 98.1 F (36.7 C) 97.9 F (36.6 C) 97.8 F (36.6 C) 97.6 F (36.4 C)  TempSrc: Oral Oral Oral Oral  SpO2: 100% 100% 98% 99%  Weight:    46.4 kg    General: thin elderly female, in no acute distress.  Speaks in short sentences secondary to shortness of breath.  Satting well on 3 L in the room. HE: Normocephalic, atraumatic , EOMI, Conjunctivae normal ENT: No congestion, no rhinorrhea, no exudate or erythema  Cardiovascular: Tachycardia, regular rhythm.  No murmurs, rubs, or gallops Pulmonary : No increased work of breathing at rest.  But does become more tachypneic with speech.  Crackles of right lower lung bases no crackles on the right.  No wheezing. Abdominal: soft, nontender,  bowel sounds present Musculoskeletal: no swelling , deformity, injury ,or tenderness in extremities, 2+ BLE edema Skin: Warm, dry , 1.5 circular scaly raised patch on the right flank near site of thoracentesis.  Appears consistent with actinic keratosis. Psychiatric/Behavioral:  normal mood, normal behavior    Assessment/Plan:  Principal Problem:   Acute respiratory failure with hypoxia (HCC) Active Problems:   Atrial fibrillation (HCC)   COPD (chronic obstructive pulmonary disease) (HCC)   Pulmonary nodule   Pleural effusion, right  Acute respiratory failure with hypoxia COPD exacerbation Her worsening dyspnea on exertion is likely due to COPD exacerbation and worsening of right pleural effusion.  Thoracentesis performed on 10/7.  Patient tolerated procedure well.  Pleural fluid with elevated  LDH of 215.  Cell count elevated at 4 point 4K with lymphocyte predominance.  Fluid culture with few WBCs.  Likely exudative pleural fluid in setting of possible underlying malignancy.  Shortness of breath is subjectively improved she remained stable on home 3 L nasal cannula at rest. -Pulmonology recommending 5 days of prednisone and doxycycline. -Continue prednisone 20, start Doxy 100mg  2 times daily to complete 5 days. -DuoNebs as needed -Breo and Incruse Ellipta daily -Phonology follow-up next week.   Right pleural effusion Right pulmonary nodule Her effusion is likely malignant secondary to the right middle lobe mass, could also be chronic inflammation from aspiration.  Tumor is increased in size from 3.3 to 3.9 cm.  Right pleural effusion also increased since prior.  She is not a candidate for bronchoscopy due to multiple comorbidities.  Underwent thoracocentesis on 10/7.  Pleural fluid with elevated LDH and cell count with lymphocyte predominance likely exudative in setting of malignancy. She will follow-up in pulmonology clinic in the next week   Right-sided chest pain, back pain and side pain Her pain is noncardiac and atypical.  ACS was ruled out with flat troponin and unremarkable EKG. She does have history of right rib fracture with notable thoracic compression fracture, which could be related to her pain.  Currently denies any pain. -Tylenol as needed -Lidocaine patch if needed   HFpEF Echocardiogram in 01/2020 showed normal EF.  She is taking Lasix 40 mg at home.  Reports stable weight. BNP is only mildly elevated. -s/p 1 dose of IV Lasix 40 mg for her LE  edema and right pleural effusion   Persistent A. fib -Continue diltiazem 180 mg p.o. daily -Restart Eliquis 24 hours after thoracentesis   Hypothyroidism Patient is on levothyroxine at home. TSH 0.312 -Continue levothyroxine 137 mcg p.o. daily   Prior to Admission Living Arrangement: Home Anticipated Discharge Location:  Home Barriers to Discharge: Dispo: Anticipated discharge tomorrow  Iona Beard, MD 01/05/2021, 6:07 AM Pager: [phone] After 5pm on weekdays and 1pm on weekends: On Call pager [phone]

## 2021-01-06 ENCOUNTER — Encounter (HOSPITAL_COMMUNITY): Payer: Self-pay | Admitting: Pulmonary Disease

## 2021-01-06 LAB — CBC
HCT: 39.6 % (ref 36.0–46.0)
Hemoglobin: 12.9 g/dL (ref 12.0–15.0)
MCH: 32.1 pg (ref 26.0–34.0)
MCHC: 32.6 g/dL (ref 30.0–36.0)
MCV: 98.5 fL (ref 80.0–100.0)
Platelets: 339 10*3/uL (ref 150–400)
RBC: 4.02 MIL/uL (ref 3.87–5.11)
RDW: 13.9 % (ref 11.5–15.5)
WBC: 13.6 10*3/uL — ABNORMAL HIGH (ref 4.0–10.5)
nRBC: 0 % (ref 0.0–0.2)

## 2021-01-06 LAB — BASIC METABOLIC PANEL
Anion gap: 9 (ref 5–15)
BUN: 15 mg/dL (ref 8–23)
CO2: 32 mmol/L (ref 22–32)
Calcium: 8.7 mg/dL — ABNORMAL LOW (ref 8.9–10.3)
Chloride: 98 mmol/L (ref 98–111)
Creatinine, Ser: 0.54 mg/dL (ref 0.44–1.00)
GFR, Estimated: >60 mL/min (ref >60)
Glucose, Bld: 103 mg/dL — ABNORMAL HIGH (ref 70–99)
Potassium: 3.6 mmol/L (ref 3.5–5.1)
Sodium: 139 mmol/L (ref 135–145)

## 2021-01-06 LAB — GLUCOSE, CAPILLARY
Glucose-Capillary: 106 mg/dL — ABNORMAL HIGH (ref 70–99)
Glucose-Capillary: 146 mg/dL — ABNORMAL HIGH (ref 70–99)

## 2021-01-06 MED ORDER — DOXYCYCLINE HYCLATE 100 MG PO TABS: 100.0000 mg | ORAL_TABLET | Freq: Two times a day (BID) | ORAL | 0 refills | Status: AC

## 2021-01-06 MED ORDER — PREDNISONE 20 MG PO TABS: 20.0000 mg | ORAL_TABLET | Freq: Every day | ORAL | 0 refills | Status: AC

## 2021-01-06 NOTE — Discharge Summary (Addendum)
Name: Heather [name] MRN: [id number] DOB: 1927-01-18 87 y.o. PCP: Burnard Bunting, MD  Date of Admission: 01/03/2021  2:57 PM Date of Discharge: 01/06/2021 Attending Physician: Lucious Groves, DO  Discharge Diagnosis: 1. Acute respiratory failure with hypoxia, COPD exacerbation 2. Right pleural effusion 3. Persistent atrial fibrillation 4. Hypothyroidism 5. HFpEF  Discharge Medications: Allergies as of 01/06/2021       Reactions   Sulfa Antibiotics    Other reaction(s): nausea & vomiting   Sulfamethoxazole-trimethoprim    Other reaction(s): Unknown   Ace Inhibitors Cough   Elemental Sulfur Nausea And Vomiting        Medication List     TAKE these medications    albuterol (2.5 MG/3ML) 0.083% nebulizer solution Commonly known as: PROVENTIL Take 2.5 mg by nebulization 2 (two) times daily as needed for wheezing or shortness of breath.   albuterol 108 (90 Base) MCG/ACT inhaler Commonly known as: VENTOLIN HFA Inhale 2 puffs into the lungs every 4 (four) hours as needed for wheezing or shortness of breath.   apixaban 2.5 MG Tabs tablet Commonly known as: Eliquis Take 1 tablet (2.5 mg total) by mouth 2 (two) times daily.   BIOGAIA PROBIOTIC PO Take 1 capsule by mouth daily.   Breztri Aerosphere 160-9-4.8 MCG/ACT Aero Generic drug: Budeson-Glycopyrrol-Formoterol Take 2 puffs by mouth 2 (two) times daily.   diltiazem 180 MG 24 hr capsule Commonly known as: CARDIZEM CD Take 180 mg by mouth daily.   doxycycline 100 MG tablet Commonly known as: VIBRA-TABS Take 1 tablet (100 mg total) by mouth every 12 (twelve) hours for 4 days. What changed: when to take this   feeding supplement Liqd Take 237 mLs by mouth 2 (two) times daily between meals.   fluticasone 50 MCG/ACT nasal spray Commonly known as: FLONASE Place 2 sprays into both nostrils daily.   furosemide 40 MG tablet Commonly known as: LASIX Take 1 tablet (40 mg total) by mouth daily. May take extra  20mg (60mg  am) for weight gain of 3#/overnight or 5#/weekly What changed:  how much to take when to take this additional instructions   guaiFENesin-dextromethorphan 100-10 MG/5ML syrup Commonly known as: ROBITUSSIN DM Take 10 mLs by mouth every 6 (six) hours as needed for cough.   levothyroxine 137 MCG tablet Commonly known as: SYNTHROID Take 137 mcg by mouth daily before breakfast.   multivitamin with minerals tablet Take 1 tablet by mouth daily.   polyethylene glycol 17 g packet Commonly known as: MIRALAX / GLYCOLAX Take 17 g by mouth daily.   potassium chloride SA 20 MEQ tablet Commonly known as: KLOR-CON Take 1 tablet (20 mEq total) by mouth daily. May take extra 20mg  (am) for weight gain 3#/daily or 5#/weekly What changed:  how much to take additional instructions   predniSONE 20 MG tablet Commonly known as: DELTASONE Take 1 tablet (20 mg total) by mouth daily with breakfast for 3 days. Start taking on: January 07, 2021 What changed:  medication strength how much to take how to take this when to take this additional instructions   THERATEARS OP Place 2 drops into both eyes daily.        Disposition and follow-up:   Ms.Heather [name] was discharged from [facility] in Stable condition.  At the hospital follow up visit please address:  1.  Right pleural effusion: Patient underwent thoracentesis on 10/7, likely exudative pleural fluid in setting of possible underlying malignancy (see below).  She was discharged on prednisone and  doxycycline regimens (on day 2/5 currently).  She will follow-up in the pulmonology clinic this week.  2.  Labs / imaging needed at time of follow-up: None  3.  Pending labs/ test needing follow-up: None  Follow-up Appointments:   Hospital Course by problem list: 1. Acute respiratory failure with hypoxia COPD exacerbation Patient presented to the ED on 10/6 with complaints of dyspnea, chest tightness, coughing  with pink-colored sputum 1 week ago for which she was seen by her pulmonologist.  She states that she is on 3 L oxygen at home at rest, and she is on 5 L oxygen with exertion.  Her pulmonologist placed her on doxycycline and prednisone for COPD exacerbation. she initially improved, however she has had worsening of her symptoms prompting her to present back to her pulmonologist, who recommended further evaluation in the ED.  Chest x-ray in the ED did not show evidence of pneumonia. Low suspicion for PE when she is adherent to her Eliquis. In the ED, she received nebulizer treatment and IV Solu-Medrol, and pulmonology was consulted.  When she was admitted to her service, we continued her inhaler, nebulizer,and prednisone. On 10/7, patient was satting well on 3 L and states that her shortness of breath has slightly improved.  Transitioned to p.o. prednisone on 10/7.  Pulmonology recommended thoracentesis, which was performed the same day (see below). The following day, patient continued to remain stable on home 3 L nasal cannula at rest. She was discharged on prednisone and doxycycline regimens, on day 2/5 today.    Right pleural effusion Right pulmonary nodule Her effusion is likely malignant secondary to the right middle lobe mass versus chronic inflammation from aspiration.  CT imaging demonstrates that tumor is increased in size from 3.3 to 3.9 cm.  Right pleural effusion also increased since prior.  She is not a candidate for bronchoscopy due to multiple comorbidities.  Pulmonology recommended thoracentesis, which was performed on 10/7. Pleural fluid with elevated LDH of 215.  Cell count elevated at 4 point 4K with lymphocyte predominance.  Fluid culture with few WBCs.  Likely exudative pleural fluid in setting of possible underlying malignancy.  She was discharged with plan to follow-up in the pulmonology clinic this coming week.   Right-sided chest pain, back pain and side pain Her pain is noncardiac and  atypical. ACS was ruled out with flat troponin and unremarkable EKG. She does have history of right rib fracture with notable thoracic compression fracture, which could be related to her pain. Currently denies any pain. Her pain was managed with Tylenol and lidocaine patch as needed.   HFpEF Echocardiogram in 01/2020 showed normal EF. She is taking Lasix 40 mg at home.  Reports stable weight. BNP is only mildly elevated. Was given 1 dose of IV Lasix 40 mg on 10/6. Otherwise stable throughout the rest of her hospitalization.   Persistent A. fib Resumed diltiazem and Eliquis regimens in the ED. Eliquis was held prior to her thoracentesis on 10/7 and was resumed on 10/8.   Hypothyroidism TSH of 0.312. She was on her levothyroxine regimen during her hospitalization.  Discharge Exam:   BP 128/72 (BP Location: Left Arm)   Pulse 80   Temp 97.9 F (36.6 C) (Oral)   Resp 18   Wt 47.7 kg   SpO2 97%   BMI 18.63 kg/m  Discharge exam:  General: thin elderly female, in no acute distress. Speaks in short sentences secondary to shortness of breath, however this has improved compared to exam  prior. Satting well on 3 L in the room. HE: Normocephalic, atraumatic , EOMI, Conjunctivae normal ENT: No congestion, no rhinorrhea, no exudate or erythema  Cardiovascular: Tachycardia, regular rhythm.  No murmurs, rubs, or gallops Pulmonary : No increased work of breathing at rest.  But does become more tachypneic with speech.  Crackles of right lower lung bases no crackles on the right.  No wheezing. Abdominal: soft, nontender, bowel sounds present Musculoskeletal: no swelling, deformity, injury ,or tenderness in extremities, 2+ BLE edema Skin: Warm, dry , 1.5 circular scaly raised patch on the right flank near site of thoracentesis.  Appears consistent with actinic keratosis. Psychiatric/Behavioral: normal mood, normal behavior    Pertinent Labs, Studies, and Procedures:  CBC    Component Value Date/Time    WBC 13.6 (H) 01/06/2021 0159   RBC 4.02 01/06/2021 0159   HGB 12.9 01/06/2021 0159   HCT 39.6 01/06/2021 0159   PLT 339 01/06/2021 0159   MCV 98.5 01/06/2021 0159   MCH 32.1 01/06/2021 0159   MCHC 32.6 01/06/2021 0159   RDW 13.9 01/06/2021 0159   LYMPHSABS 0.8 01/03/2021 1507   MONOABS 0.2 01/03/2021 1507   EOSABS 0.0 01/03/2021 1507   BASOSABS 0.0 01/03/2021 1507    BMP Latest Ref Rng & Units 01/06/2021 01/05/2021 01/04/2021  Glucose 70 - 99 mg/dL 103(H) 126(H) 312(H)  BUN 8 - 23 mg/dL 15 19 17   Creatinine 0.44 - 1.00 mg/dL 0.54 0.70 0.84  BUN/Creat Ratio 12 - 28 - - -  Sodium 135 - 145 mmol/L 139 139 138  Potassium 3.5 - 5.1 mmol/L 3.6 4.3 3.0(L)  Chloride 98 - 111 mmol/L 98 97(L) 93(L)  CO2 22 - 32 mmol/L 32 35(H) 33(H)  Calcium 8.9 - 10.3 mg/dL 8.7(L) 9.0 8.6(L)    LD, fluid: 215 Glucose, fluid: 231  Fluid Type-FCT  PLEU RIGHT   Color, Fluid YELLOW STRAW Abnormal    Appearance, Fluid CLEAR HAZY Abnormal    Total Nucleated Cell Count, Fluid 0 - 1,000 cu mm 4,434 High    Neutrophil Count, Fluid 0 - 25 % 6   Lymphs, Fluid % 94   Monocyte-Macrophage-Serous Fluid 50 - 90 % 0 Low    Eos, Fluid % 0   Other Cells, Fluid % CELL CLUMPS, MESOTHELIAL CELLS   Comment: Performed at [facility], [place] [address], [place], [place] [address]  Resulting Agency  [facility]     TSH: 0.312 Troponin 15   DG Chest 2 View  Result Date: 01/03/2021 CLINICAL DATA:  Shortness of breath. EXAM: CHEST - 2 VIEW COMPARISON:  December 28, 2020. FINDINGS: The heart size and mediastinal contours are within normal limits. No pneumothorax is noted. Mild right pleural effusion is noted with associated right basilar atelectasis or infiltrate. Left lung is clear. Multiple old thoracic compression fractures are noted. IMPRESSION: Mild right pleural effusion with associated right basilar atelectasis or infiltrate. Aortic Atherosclerosis (ICD10-I70.0). Electronically Signed   By: Marijo Conception M.D.    On: 01/03/2021 16:20   CT CHEST WO CONTRAST  Result Date: 01/03/2021 CLINICAL DATA:  Follow-up right pleural effusion EXAM: CT CHEST WITHOUT CONTRAST TECHNIQUE: Multidetector CT imaging of the chest was performed following the standard protocol without IV contrast. COMPARISON:  Chest x-ray from earlier in the same day, CT from 07/29/2020. FINDINGS: Cardiovascular: Somewhat limited due to lack of IV contrast. Atherosclerotic calcifications of the aorta are noted without aneurysmal dilatation. No cardiac enlargement is seen. Coronary calcifications are noted. Mediastinum/Nodes:  Thoracic inlet is within normal limits. Prominent subcarinal lymph node is noted which measures 2 cm in short axis but demonstrates diffuse calcifications. No other sizable hilar or mediastinal adenopathy is noted. The esophagus as visualized is within normal limits. Lungs/Pleura: Small right-sided pleural effusion is noted similar to that seen on prior plain film examination. Lungs are well aerated bilaterally with diffuse emphysematous changes. The left lung is clear. The right lung demonstrates basilar atelectatic changes as well as a rounded area of consolidation in the right middle lobe. This has increased in size when compared with the prior exam and demonstrates some increase in calcification. It now measures 3.9 cm in transverse dimension and previously measured 3.3 cm in transverse dimension. Upper Abdomen: Cortical calcification is noted in the upper pole of the left kidney. No obstructive changes are seen. Changes of prior cholecystectomy are noted. Remainder of the upper abdomen appears within normal limits. Musculoskeletal: Degenerative changes of the thoracic spine are noted. No acute rib fracture is seen. Some old healing rib fractures are noted on the right posteriorly. Chronic appearing compression deformities of T8, T9 and T11 are not seen. These are stable in appearance from the prior CT. IMPRESSION: Right-sided pleural  effusion with right basilar atelectasis. This is increased from the prior exam. Slight enlargement in partially calcified area of right middle lobe density now measuring 3.9 cm increased from 3.3 cm. Again this is suspicious for metastatic disease. Multiple thoracic compression deformities which are chronic in nature. Aortic Atherosclerosis (ICD10-I70.0) and Emphysema (ICD10-J43.9). Electronically Signed   By: Inez Catalina M.D.   On: 01/03/2021 23:49   DG CHEST PORT 1 VIEW  Result Date: 01/04/2021 CLINICAL DATA:  Right pleural effusion EXAM: PORTABLE CHEST 1 VIEW COMPARISON:  01/03/2021 FINDINGS: Single frontal view of the chest demonstrates a stable cardiac silhouette. No significant change in the right pleural effusion and right basilar consolidation seen previously. The left chest remains clear. No pneumothorax. IMPRESSION: 1. Stable right basilar consolidation and right pleural effusion. Electronically Signed   By: Randa Ngo M.D.  On: 01/04/2021 17:39     Signed: Orvis Brill, MD 01/06/2021, 11:06 AM   Pager: [phone]

## 2021-01-06 NOTE — Plan of Care (Signed)
Patient progressing. Being discharged later this am.

## 2021-01-06 NOTE — Discharge Instructions (Addendum)
You were admitted to the hospital for shortness of breath and were placed on medications for your COPD and got a procedure called a thoracentesis to get some fluid off of your lungs.  You are now on your home oxygen levels and have improved since you were admitted.  Per the lung doctor recommendations, you will continue 2 medications called prednisone and doxycycline.  Your last day on these medications will be October 12. The lung doctor will help arrange your follow-up in their clinic outside of the hospital this week.  We also recommend that you be evaluated by dermatologist for the spot on your back that we discussed today.  Otherwise, follow-up with your primary care provider in the next few days to discuss your hospitalization here.

## 2021-01-08 LAB — BODY FLUID CULTURE W GRAM STAIN
Culture: NO GROWTH
Gram Stain: NONE SEEN

## 2021-01-09 ENCOUNTER — Ambulatory Visit: Payer: Medicare HMO | Admitting: Primary Care

## 2021-01-09 ENCOUNTER — Other Ambulatory Visit: Payer: Self-pay

## 2021-01-09 ENCOUNTER — Encounter: Payer: Self-pay | Admitting: Primary Care

## 2021-01-09 ENCOUNTER — Telehealth: Payer: Self-pay | Admitting: Primary Care

## 2021-01-09 ENCOUNTER — Ambulatory Visit (INDEPENDENT_AMBULATORY_CARE_PROVIDER_SITE_OTHER): Payer: Medicare HMO

## 2021-01-09 VITALS — BP 110/64 | HR 56 | Temp 98.3°F | Ht 63.0 in | Wt 101.4 lb

## 2021-01-09 DIAGNOSIS — J90 Pleural effusion, not elsewhere classified: Secondary | ICD-10-CM

## 2021-01-09 DIAGNOSIS — R911 Solitary pulmonary nodule: Secondary | ICD-10-CM

## 2021-01-09 DIAGNOSIS — I5033 Acute on chronic diastolic (congestive) heart failure: Secondary | ICD-10-CM | POA: Diagnosis not present

## 2021-01-09 DIAGNOSIS — J441 Chronic obstructive pulmonary disease with (acute) exacerbation: Secondary | ICD-10-CM | POA: Diagnosis not present

## 2021-01-09 NOTE — Assessment & Plan Note (Addendum)
-   Patient underwent right thoracentesis on 01/04/21, cytology is still pending. CXR today showed mild right pleural effusion. She does not need repeat thoracentesis at this time, will continue to monitor for reaccumulation. Due to her hx HF and leg swelling on exam, recommend she take additional 20mg  lasix + potassium x 3 days. Recheck BNP and BMET in 1 week.

## 2021-01-09 NOTE — Patient Instructions (Addendum)
   May consider referring to rad/onc for palliative treatment of enlarging RML nodule/mass, concerning for underlying malignancy based on your symptoms of dyspnea and hemoptysis   Recommendations: Take an extra 20mg  lasix and potassium tablet x 3 days  Stay on 20mg  prednisone for 2 weeks; then resume 10mg  daily  Spoke with pathology, cytology from thoracentesis is still pending  Orders: CXR today  Labs in 1 week   Follow-up: 1 month with Dr. Lamonte Sakai or sooner if needed

## 2021-01-09 NOTE — Progress Notes (Signed)
@Patient  ID: Heather [name], female    DOB: November 15, 1926, 87 y.o.   MRN: [id number]  Chief Complaint  Patient presents with   Follow-up    Patient reports that she is having trouble with shortness of breath. The past 2 days are worse for her per daughter.     Referring provider: Burnard Bunting, MD  HPI: 87 year old female, former smoker (20 pack year hx). Followed for severe COPD, suspicious slow growing lung nodule, pleural effusion, oxygen dependent respiratory failure and Covid 19 pneumonitis in 12/2018. Patient of Dr. Lamonte Sakai, last seen on 01/03/21 by pulmonary NP.   Previous [facility] pulmonary encounters: 01/03/2021 Follow up: COPD , O2 RF , Pleural effusion .  Patient returns for a work in visit.  Patient was seen last week for a COPD exacerbation with hemoptysis.  She was started on doxycycline and short prednisone burst.  Chest x-ray last week showed moderate pleural effusion and pleural thickening with associated consolidation Patient says that she started to feel better for just a day or 2 but has been getting progressively worse and this morning woke up with severe shortness of breath.  Having trouble talking that she so short of breath also has significant pain along the right ribs and back, Today in the office patient with mild respiratory distress having trouble speaking.  Says she is too weak to stand up.  Blood pressure is decreased 90/60.  She says her appetite has been down.  She denies any vomiting but says she is very sick on her stomach with significant nausea.  Patient remains on oxygen 2 L.  Has had no increased oxygen demands. She is had no further hemoptysis over the last couple days.  Patient does remain on Eliquis.  Patient has congestive heart failure.  Says she has chronic lower extremity swelling.  Has not no increase in leg swelling.  Weight has been stable .   Patient was admitted in August with right-sided pleural effusion.  She was set up for a thoracentesis but was  unable to complete by interventional radiologist due to lack of fluid. Patient has a known spiculated right middle lobe nodule that has been increasing in size.  Patient is considered not a surgical candidate.  Also has been too weak to consider SBRT as this is suspicious for underlying lung cancer.  Hospital course: Hospital Course by problem list: 1. Acute respiratory failure with hypoxia COPD exacerbation Patient presented to the ED on 10/6 with complaints of dyspnea, chest tightness, coughing with pink-colored sputum 1 week ago for which she was seen by her pulmonologist.  She states that she is on 3 L oxygen at home at rest, and she is on 5 L oxygen with exertion.  Her pulmonologist placed her on doxycycline and prednisone for COPD exacerbation. she initially improved, however she has had worsening of her symptoms prompting her to present back to her pulmonologist, who recommended further evaluation in the ED.  Chest x-ray in the ED did not show evidence of pneumonia. Low suspicion for PE when she is adherent to her Eliquis. In the ED, she received nebulizer treatment and IV Solu-Medrol, and pulmonology was consulted.  When she was admitted to her service, we continued her inhaler, nebulizer,and prednisone. On 10/7, patient was satting well on 3 L and states that her shortness of breath has slightly improved.  Transitioned to p.o. prednisone on 10/7.  Pulmonology recommended thoracentesis, which was performed the same day (see below). The following day, patient continued to remain  stable on home 3 L nasal cannula at rest. She was discharged on prednisone and doxycycline regimens, on day 2/5 today.    Right pleural effusion Right pulmonary nodule Her effusion is likely malignant secondary to the right middle lobe mass versus chronic inflammation from aspiration.  CT imaging demonstrates that tumor is increased in size from 3.3 to 3.9 cm.  Right pleural effusion also increased since prior.  She is not  a candidate for bronchoscopy due to multiple comorbidities.  Pulmonology recommended thoracentesis, which was performed on 10/7. Pleural fluid with elevated LDH of 215.  Cell count elevated at 4 point 4K with lymphocyte predominance.  Fluid culture with few WBCs.  Likely exudative pleural fluid in setting of possible underlying malignancy.  She was discharged with plan to follow-up in the pulmonology clinic this coming week.    01/09/2021- Interim hx  Patient presents today for hospital follow-up. Admitted on 10/6/ for COPD exacerbation, treated with IV solu-medrol, oral prednisone and doxycycline. Imaging showed increased size of RML lung nodule and right pleural effusion. Patient underwent thoracentesis on 01/04/21 with pulmonary.  Fluid culture showed no growth. Cytology from thoracentesis on 10/7 was collected, results are still pending. Could be chronic inflammation from aspiration vs malignancy. High risk for bronchoscopy.   Accompanied by her daughter today. She has been doing better since hospitalizations, she had two good days on Monday and Tuesday of this week. Shortness of breath is worse today with associated weakness and fatigue. No further hemoptysis. She has completed prednisone course, she is maintained on 10mg  daily per Dr. Lamonte Sakai.   Imaging: 10/7 CT chest > personally reviewed, large calcified pulmonary nodule in the right middle lobe increased in size compared to prior with increased size right pleural effusion with likely loculated component  Allergies  Allergen Reactions   Ace Inhibitors Cough   Elemental Sulfur Nausea And Vomiting   Sulfa Antibiotics Nausea And Vomiting    Other reaction(s): nausea & vomiting   Sulfamethoxazole-Trimethoprim Nausea Only    Other reaction(s): Unknown    Immunization History  Administered Date(s) Administered   Fluad Quad(high Dose 65+) 01/26/2020, 04/02/2020   Influenza Split 06/04/2011, 12/10/2011, 03/31/2012, 12/15/2013   Influenza, High  Dose Seasonal PF 11/30/2018   Influenza, Quadrivalent, Recombinant, Inj, Pf 04/15/2018, 12/14/2018   Influenza,inj,Quad PF,6+ Mos 12/15/2013, 12/20/2014   Influenza-Unspecified 12/29/2013, 12/13/2015, 04/09/2017   PPD Test 05/13/2011   Pneumococcal Conjugate-13 06/27/2015   Pneumococcal Polysaccharide-23 04/25/2009, 04/08/2014, 01/26/2020   Tdap 06/04/2011, 06/26/2014   Zoster, Live 06/04/2011    Past Medical History:  Diagnosis Date   Anxiety    Arthritis    Atrial fibrillation ([place])    Cerumen impaction    COPD (chronic obstructive pulmonary disease) ([facility])    Dysrhythmia    AF   Essential hypertension, benign    Gallstones    GERD (gastroesophageal reflux disease)    H/O hiatal hernia    History of nuclear stress test 10/2010   dipyridamole; normal pattern of perfusion; low risk, normal study   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    Mild aortic insufficiency    Osteoporosis    Peripheral neuropathy    Pneumonia    states she had it twice, last time a couple of months ago   PONV (postoperative nausea and vomiting)    Shortness of breath    with exertion   Unspecified hypothyroidism    Venous insufficiency     Tobacco History: Social History   Tobacco Use  Smoking  Status Former   Packs/day: 1.00   Years: 20.00   Pack years: 20.00   Types: Cigarettes   Start date: 75   Quit date: 09/12/1978   Years since quitting: 42.3  Smokeless Tobacco Never   Counseling given: Not Answered   Outpatient Medications Prior to Visit  Medication Sig Dispense Refill   albuterol (PROVENTIL) (2.5 MG/3ML) 0.083% nebulizer solution Take 2.5 mg by nebulization 2 (two) times daily as needed for wheezing or shortness of breath.     albuterol (VENTOLIN HFA) 108 (90 Base) MCG/ACT inhaler Inhale 2 puffs into the lungs every 4 (four) hours as needed for wheezing or shortness of breath. 18 g 5   apixaban (ELIQUIS) 2.5 MG TABS tablet Take 1 tablet (2.5 mg  total) by mouth 2 (two) times daily. 180 tablet 1   Budeson-Glycopyrrol-Formoterol (BREZTRI AEROSPHERE) 160-9-4.8 MCG/ACT AERO Take 2 puffs by mouth 2 (two) times daily.     Carboxymethylcellulose Sodium (THERATEARS OP) Place 2 drops into both eyes daily.      diltiazem (CARDIZEM CD) 180 MG 24 hr capsule Take 180 mg by mouth daily.      doxycycline (VIBRA-TABS) 100 MG tablet Take 1 tablet (100 mg total) by mouth every 12 (twelve) hours for 4 days. 8 tablet 0   feeding supplement, ENSURE ENLIVE, (ENSURE ENLIVE) LIQD Take 237 mLs by mouth 2 (two) times daily between meals. 237 mL 12   fluticasone (FLONASE) 50 MCG/ACT nasal spray Place 2 sprays into both nostrils daily. 16 g 6   furosemide (LASIX) 40 MG tablet Take 1 tablet (40 mg total) by mouth daily. May take extra 20mg (60mg  am) for weight gain of 3#/overnight or 5#/weekly (Patient taking differently: Take 40-60 mg by mouth See admin instructions. 40mg  oral daily and May take extra 20mg  for weight gain of 3lbs overnight or 5lbs weekly) 40 tablet 6   guaiFENesin-dextromethorphan (ROBITUSSIN DM) 100-10 MG/5ML syrup Take 10 mLs by mouth every 6 (six) hours as needed for cough. 118 mL 1   Lactobacillus Reuteri (BIOGAIA PROBIOTIC PO) Take 1 capsule by mouth daily.     levothyroxine (SYNTHROID, LEVOTHROID) 137 MCG tablet Take 137 mcg by mouth daily before breakfast.      Multiple Vitamins-Minerals (MULTIVITAMIN WITH MINERALS) tablet Take 1 tablet by mouth daily.     polyethylene glycol (MIRALAX / GLYCOLAX) packet Take 17 g by mouth daily.     potassium chloride SA (KLOR-CON) 20 MEQ tablet Take 1 tablet (20 mEq total) by mouth daily. May take extra 20mg  (am) for weight gain 3#/daily or 5#/weekly (Patient taking differently: Take 20-40 mEq by mouth daily. 23meq oral daily and May take extra 20mg  for weight gain 3 lbs daily or 5 lbsweekly) 40 tablet 6   predniSONE (DELTASONE) 20 MG tablet Take 1 tablet (20 mg total) by mouth daily with breakfast for 3 days. 3  tablet 0   No facility-administered medications prior to visit.    Review of Systems  Review of Systems  Constitutional:  Positive for fatigue.  HENT: Negative.    Respiratory:  Positive for shortness of breath. Negative for cough, chest tightness and wheezing.   Cardiovascular:  Positive for leg swelling.  Neurological:  Positive for weakness.    Physical Exam  BP 110/64 (BP Location: Left Arm, Patient Position: Sitting, Cuff Size: Normal)   Pulse (!) 56   Temp 98.3 F (36.8 C) (Oral)   Ht 5\' 3"  (1.6 m)   Wt 101 lb 6.4 oz (46 kg)   SpO2  95%   BMI 17.96 kg/m  Physical Exam Constitutional:      Appearance: Normal appearance.  HENT:     Head: Normocephalic and atraumatic.  Cardiovascular:     Rate and Rhythm: Normal rate and regular rhythm.  Pulmonary:     Effort: Pulmonary effort is normal.     Breath sounds: Normal breath sounds. No wheezing, rhonchi or rales.     Comments: Diminished  Skin:    General: Skin is warm and dry.  Neurological:     General: No focal deficit present.     Mental Status: She is alert and oriented to person, place, and time. Mental status is at baseline.  Psychiatric:        Mood and Affect: Mood normal.        Behavior: Behavior normal.        Thought Content: Thought content normal.        Judgment: Judgment normal.     Lab Results:  CBC    Component Value Date/Time   WBC 13.6 (H) 01/06/2021 0159   RBC 4.02 01/06/2021 0159   HGB 12.9 01/06/2021 0159   HCT 39.6 01/06/2021 0159   PLT 339 01/06/2021 0159   MCV 98.5 01/06/2021 0159   MCH 32.1 01/06/2021 0159   MCHC 32.6 01/06/2021 0159   RDW 13.9 01/06/2021 0159   LYMPHSABS 0.8 01/03/2021 1507   MONOABS 0.2 01/03/2021 1507   EOSABS 0.0 01/03/2021 1507   BASOSABS 0.0 01/03/2021 1507    BMET    Component Value Date/Time   NA 139 01/06/2021 0159   NA 143 02/12/2017 1216   K 3.6 01/06/2021 0159   CL 98 01/06/2021 0159   CO2 32 01/06/2021 0159   GLUCOSE 103 (H) 01/06/2021  0159   BUN 15 01/06/2021 0159   BUN 22 02/12/2017 1216   CREATININE 0.54 01/06/2021 0159   CALCIUM 8.7 (L) 01/06/2021 0159   GFRNONAA >60 01/06/2021 0159   GFRAA >60 10/20/2019 0220    BNP    Component Value Date/Time   BNP 107.0 (H) 01/03/2021 1507    ProBNP    Component Value Date/Time   PROBNP 121.6 02/20/2014 1435    Imaging: DG Chest 2 View  Result Date: 01/09/2021 CLINICAL DATA:  Right pleural effusion. EXAM: CHEST - 2 VIEW COMPARISON:  January 04, 2021. FINDINGS: The heart size and mediastinal contours are within normal limits. No pneumothorax is noted. Mild right pleural effusion is noted with associated right basilar atelectasis or infiltrate. Left lung is clear. Multiple old thoracic compression fractures are noted. IMPRESSION: Mild right pleural effusion with associated right basilar atelectasis or infiltrate. Aortic Atherosclerosis (ICD10-I70.0). Electronically Signed   By: Marijo Conception M.D.   On: 01/09/2021 11:59   DG Chest 2 View  Result Date: 01/03/2021 CLINICAL DATA:  Shortness of breath. EXAM: CHEST - 2 VIEW COMPARISON:  December 28, 2020. FINDINGS: The heart size and mediastinal contours are within normal limits. No pneumothorax is noted. Mild right pleural effusion is noted with associated right basilar atelectasis or infiltrate. Left lung is clear. Multiple old thoracic compression fractures are noted. IMPRESSION: Mild right pleural effusion with associated right basilar atelectasis or infiltrate. Aortic Atherosclerosis (ICD10-I70.0). Electronically Signed   By: Marijo Conception M.D.   On: 01/03/2021 16:20   DG Chest 2 View  Result Date: 12/28/2020 CLINICAL DATA:  Hemoptysis, increased shortness of breath, COPD EXAM: CHEST - 2 VIEW COMPARISON:  10/27/2020 FINDINGS: No significant change in chest radiographs. Pulmonary  hyperinflation and emphysema. Moderate right pleural effusion and/or pleural thickening with associated atelectasis or consolidation. Heart and  mediastinum are normal in size. Disc degenerative disease of the thoracic spine with multiple wedge deformities. IMPRESSION: 1.  No significant change in chest radiographs. 2.  Pulmonary hyperinflation and emphysema. 3. Moderate right pleural effusion and/or pleural thickening with associated atelectasis or consolidation. Electronically Signed   By: Delanna Ahmadi M.D.   On: 12/28/2020 12:46   CT CHEST WO CONTRAST  Result Date: 01/03/2021 CLINICAL DATA:  Follow-up right pleural effusion EXAM: CT CHEST WITHOUT CONTRAST TECHNIQUE: Multidetector CT imaging of the chest was performed following the standard protocol without IV contrast. COMPARISON:  Chest x-ray from earlier in the same day, CT from 07/29/2020. FINDINGS: Cardiovascular: Somewhat limited due to lack of IV contrast. Atherosclerotic calcifications of the aorta are noted without aneurysmal dilatation. No cardiac enlargement is seen. Coronary calcifications are noted. Mediastinum/Nodes: Thoracic inlet is within normal limits. Prominent subcarinal lymph node is noted which measures 2 cm in short axis but demonstrates diffuse calcifications. No other sizable hilar or mediastinal adenopathy is noted. The esophagus as visualized is within normal limits. Lungs/Pleura: Small right-sided pleural effusion is noted similar to that seen on prior plain film examination. Lungs are well aerated bilaterally with diffuse emphysematous changes. The left lung is clear. The right lung demonstrates basilar atelectatic changes as well as a rounded area of consolidation in the right middle lobe. This has increased in size when compared with the prior exam and demonstrates some increase in calcification. It now measures 3.9 cm in transverse dimension and previously measured 3.3 cm in transverse dimension. Upper Abdomen: Cortical calcification is noted in the upper pole of the left kidney. No obstructive changes are seen. Changes of prior cholecystectomy are noted. Remainder of  the upper abdomen appears within normal limits. Musculoskeletal: Degenerative changes of the thoracic spine are noted. No acute rib fracture is seen. Some old healing rib fractures are noted on the right posteriorly. Chronic appearing compression deformities of T8, T9 and T11 are not seen. These are stable in appearance from the prior CT. IMPRESSION: Right-sided pleural effusion with right basilar atelectasis. This is increased from the prior exam. Slight enlargement in partially calcified area of right middle lobe density now measuring 3.9 cm increased from 3.3 cm. Again this is suspicious for metastatic disease. Multiple thoracic compression deformities which are chronic in nature. Aortic Atherosclerosis (ICD10-I70.0) and Emphysema (ICD10-J43.9). Electronically Signed   By: Inez Catalina M.D.   On: 01/03/2021 23:49   DG CHEST PORT 1 VIEW  Result Date: 01/04/2021 CLINICAL DATA:  Right pleural effusion EXAM: PORTABLE CHEST 1 VIEW COMPARISON:  01/03/2021 FINDINGS: Single frontal view of the chest demonstrates a stable cardiac silhouette. No significant change in the right pleural effusion and right basilar consolidation seen previously. The left chest remains clear. No pneumothorax. IMPRESSION: 1. Stable right basilar consolidation and right pleural effusion. Electronically Signed   By: Randa Ngo M.D.   On: 01/04/2021 17:39     Assessment & Plan:   Pleural effusion, right - Patient underwent right thoracentesis on 01/04/21, cytology is still pending. CXR today showed mild right pleural effusion. She does not need repeat thoracentesis at this time, will continue to monitor for reaccumulation. Due to her hx HF and leg swelling on exam, recommend she take additional 20mg  lasix + potassium x 3 days. Recheck BNP and BMET in 1 week.   Pulmonary nodule - Enlarging RML nodule/mass. She is not a good  candidate for bronchoscopy or biopsy. Awaiting cytology from right thoracentesis performed on 01/04/21-  confirmed with pathology still pending. May consider referring to rad/onc for palliative treatment of enlarging RML nodule/mass, concerning for underlying malignancy based her symptoms of progressive dyspnea and recent hemoptysis. Family and patient are not wanting agressive treatment and open to pallative options.   COPD (chronic obstructive pulmonary disease) ([place]) - Admitted to the hospital for severe COPD exacerbation on 01/03/21. Treated with IV solumedrol, BD, doxycycline and prednisone. She has noticed worsening shortness of breath 1-2 days since tapering prednisone. Recommend she continue 20mg  daily x 2 weeks and then resume baseline dose of 10mg  daily. Discussed risk vs benefits of steroid use, symptomatic benefit outweighing risks at this time.  Continue Breztri Aerosphere two puffs twice daily.    Martyn Ehrich, NP 01/09/2021

## 2021-01-09 NOTE — Assessment & Plan Note (Addendum)
-   Enlarging RML nodule/mass. She is not a good candidate for bronchoscopy or biopsy. Awaiting cytology from right thoracentesis performed on 01/04/21- confirmed with pathology still pending. May consider referring to rad/onc for palliative treatment of enlarging RML nodule/mass, concerning for underlying malignancy based her symptoms of progressive dyspnea and recent hemoptysis. Family and patient are not wanting agressive treatment and open to pallative options.

## 2021-01-09 NOTE — Progress Notes (Signed)
Please let patient know she has mild right pleural effusion. No need for repeat thoracentesis at this time. Will continue to monitor for re-accumulation

## 2021-01-09 NOTE — Telephone Encounter (Signed)
Heather Ehrich, NP  01/09/2021 12:04 PM EDT     Please let patient know she has mild right pleural effusion. No need for repeat thoracentesis at this time. Will continue to monitor for re-accumulation     Called and spoke with pt's daughter [name] letting her know the results of pt's cxr and she verbalized understanding. Nothing further needed.

## 2021-01-09 NOTE — Assessment & Plan Note (Addendum)
-   Admitted to the hospital for severe COPD exacerbation on 01/03/21. Treated with IV solumedrol, BD, doxycycline and prednisone. She has noticed worsening shortness of breath 1-2 days since tapering prednisone. Recommend she continue 20mg  daily x 2 weeks and then resume baseline dose of 10mg  daily. Discussed risk vs benefits of steroid use, symptomatic benefit outweighing risks at this time.  Continue Breztri Aerosphere two puffs twice daily.

## 2021-01-10 ENCOUNTER — Telehealth: Payer: Self-pay | Admitting: Emergency Medicine

## 2021-01-10 LAB — CYTOLOGY - NON PAP

## 2021-01-10 MED ORDER — PREDNISONE 10 MG PO TABS: ORAL_TABLET | ORAL | 0 refills | Status: AC

## 2021-01-10 NOTE — Telephone Encounter (Signed)
Apppointment made. Pt double booked per Dr. Valeta Harms 01/18/2021 at 1615.

## 2021-01-10 NOTE — Telephone Encounter (Signed)
Per AVS- patient is to stay on prednisone 20mg  for 2 weeks and then 10mg  daily. Rx has been sent to preferred pharmacy. Patient's daughter, [name](DPR) is aware and voiced her understanding.  Nothing further needed,

## 2021-01-10 NOTE — Telephone Encounter (Signed)
Spoke to patient's daughter, [name](DPR) [name] stated that she received thoracentesis results from [facility]. Patient is scheduled for f/u on 02/26/2021. [name] is questioning if sooner f/u is needed based off of results.   Dr. Lamonte Sakai, please advise. thanks

## 2021-01-10 NOTE — Telephone Encounter (Signed)
I reviewed the cytology > shows adenoCA in the pleural fluid as we suspected.  I would like to work on setting her up for a possible pleurx tube. I'm going to work on getting her in to see Dr Valeta Harms to discuss it.

## 2021-01-11 ENCOUNTER — Ambulatory Visit: Payer: Medicare HMO | Admitting: Adult Health

## 2021-01-11 DIAGNOSIS — J449 Chronic obstructive pulmonary disease, unspecified: Secondary | ICD-10-CM | POA: Diagnosis not present

## 2021-01-11 DIAGNOSIS — U071 COVID-19: Secondary | ICD-10-CM | POA: Diagnosis not present

## 2021-01-11 NOTE — Telephone Encounter (Signed)
Spoke with Heather [name] and confirmed appointment for pt on 01/18/21 with Dr. Valeta Harms. Heather [name] stated understanding. Nothing further needed at this time.

## 2021-01-14 DIAGNOSIS — J449 Chronic obstructive pulmonary disease, unspecified: Secondary | ICD-10-CM | POA: Diagnosis not present

## 2021-01-18 ENCOUNTER — Other Ambulatory Visit: Payer: Self-pay

## 2021-01-18 ENCOUNTER — Encounter: Payer: Self-pay | Admitting: Pulmonary Disease

## 2021-01-18 ENCOUNTER — Ambulatory Visit: Payer: Medicare HMO | Admitting: Pulmonary Disease

## 2021-01-18 VITALS — BP 118/74 | HR 88 | Temp 97.9°F | Ht 63.0 in | Wt 100.2 lb

## 2021-01-18 DIAGNOSIS — J90 Pleural effusion, not elsewhere classified: Secondary | ICD-10-CM | POA: Diagnosis not present

## 2021-01-18 DIAGNOSIS — J9611 Chronic respiratory failure with hypoxia: Secondary | ICD-10-CM

## 2021-01-18 DIAGNOSIS — C3491 Malignant neoplasm of unspecified part of right bronchus or lung: Secondary | ICD-10-CM | POA: Diagnosis not present

## 2021-01-18 DIAGNOSIS — J910 Malignant pleural effusion: Secondary | ICD-10-CM | POA: Diagnosis not present

## 2021-01-18 NOTE — Progress Notes (Signed)
Synopsis: Referred in October 2022 for malignant pleural effusion, by Dr. Lamonte Sakai, PCP: By Burnard Bunting, MD  Subjective:   PATIENT ID: Heather [name] DOB: [birth year], MRN: [id number]  Chief Complaint  Patient presents with   Follow-up    This is a 87 year old [name], past medical history of severe COPD, chronic hypoxemic respiratory failure found to have a right-sided pleural effusion on recent hospitalization.  Patient underwent thoracentesis that revealed a malignant pleural effusion secondary to non-small cell lung cancer.  Patient's daughter present with her today in the office.  She did have significant relief after drainage of the effusion.  She had a repeat chest x-ray on 01/09/2021 which showed reaccumulation of the fluid.  Patient was referred here by Dr. Lamonte Sakai to discuss placement of a indwelling pleural catheter for palliative measures and drainage of the fluid at home.   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    Mild aortic insufficiency    Osteoporosis    Peripheral neuropathy    Pneumonia    states she had it twice, last time a couple of months ago   PONV (postoperative nausea and vomiting)    Shortness of breath    with exertion   Unspecified hypothyroidism    Venous insufficiency      Family History  Problem Relation Age of Onset   Stroke Mother    Stroke Father    Heart block Brother    Bladder Cancer Brother    Diabetes Brother    Stroke Brother      Past Surgical History:  Procedure Laterality  Date   APPENDECTOMY  1935   BACK SURGERY     x2   Cardiometablic Testing  06/05/6576   submaximal effort with peak RER of 0.5, peak VO2 79% predicted; HR peak up to 78%; PVC was 55% predicted, PEV1 39% predicted; PEV1/VC ratio was reduced; normal vital capacity; DLCO was reduced to 63%   CARPAL TUNNEL RELEASE     CATARACT EXTRACTION W/ INTRAOCULAR LENS  IMPLANT, BILATERAL     CHOLECYSTECTOMY  04/07/2014   dr Marlou Starks   CHOLECYSTECTOMY N/A 04/07/2014   Procedure: LAPAROSCOPIC CHOLECYSTECTOMY WITH INTRAOPERATIVE CHOLANGIOGRAM POSSBILE OPEN;  Surgeon: Autumn Messing III, MD;  Location: [place];  Service: General;  Laterality: N/A;   COLON SURGERY  2008   partial   ESOPHAGOGASTRODUODENOSCOPY (EGD) WITH PROPOFOL N/A 05/25/2017   Procedure: ESOPHAGOGASTRODUODENOSCOPY (EGD) WITH PROPOFOL;  Surgeon: Clarene Essex, MD;  Location: [facility];  Service: Endoscopy;  Laterality: N/A;   HERNIA REPAIR     JOINT REPLACEMENT     left hip relacement  2000   SAVORY DILATION N/A 05/25/2017   Procedure: SAVORY DILATION;  Surgeon: Clarene Essex, MD;  Location: [place];  Service: Endoscopy;  Laterality: N/A;   THORACENTESIS N/A 01/04/2021   Procedure: THORACENTESIS;  Surgeon: Juanito Doom, MD;  Location: [place];  Service: Cardiopulmonary;  Laterality: N/A;   [place]  ECHOCARDIOGRAM  05/2011   EF=>55%; mild conc LVH; mild mitral annular calcif; mild TR; AV mildly sclerotic & mild AR   VENTRAL HERNIA REPAIR  09/19/2011   Procedure: LAPAROSCOPIC VENTRAL HERNIA;  Surgeon: Merrie Roof, MD;  Location: [facility] OR;  Service: General;  Laterality: N/A;  laparoscopic ventral hernia repair with mesh    Social History   Socioeconomic History   Marital status: Married    Spouse name: Not on file   Number of children: 2   Years of education: Not on file   Highest education level: Not on file  Occupational History   Occupation: Retired Astronomer  Tobacco  Use   Smoking status: Former    Packs/day: 1.00    Years: 20.00    Pack years: 20.00    Types: Cigarettes    Start date: 24    Quit date: 09/12/1978    Years since quitting: 42.3   Smokeless tobacco: Never  Vaping Use   Vaping Use: Never used  Substance and Sexual Activity   Alcohol use: Not Currently    Alcohol/week: 0.0 standard drinks    Comment: occ   Drug use: No   Sexual activity: Yes  Other Topics Concern   Not on file  Social History Narrative   Right handed   One story home   Drinks coffee   Social Determinants of Health   Financial Resource Strain: Not on file  Food Insecurity: Not on file  Transportation Needs: Not on file  Physical Activity: Not on file  Stress: Not on file  Social Connections: Not on file  Intimate Partner Violence: Not on file     Allergies  Allergen Reactions   Ace Inhibitors Cough   Elemental Sulfur Nausea And Vomiting   Sulfa Antibiotics Nausea And Vomiting    Other reaction(s): nausea & vomiting   Sulfamethoxazole-Trimethoprim Nausea Only    Other reaction(s): Unknown     Outpatient Medications Prior to Visit  Medication Sig Dispense Refill   albuterol (PROVENTIL) (2.5 MG/3ML) 0.083% nebulizer solution Take 2.5 mg by nebulization 2 (two) times daily as needed for wheezing or shortness of breath.     albuterol (VENTOLIN HFA) 108 (90 Base) MCG/ACT inhaler Inhale 2 puffs into the lungs every 4 (four) hours as needed for wheezing or shortness of breath. 18 g 5   apixaban (ELIQUIS) 2.5 MG TABS tablet Take 1 tablet (2.5 mg total) by mouth 2 (two) times daily. 180 tablet 1   Budeson-Glycopyrrol-Formoterol (BREZTRI AEROSPHERE) 160-9-4.8 MCG/ACT AERO Take 2 puffs by mouth 2 (two) times daily.     Carboxymethylcellulose Sodium (THERATEARS OP) Place 2 drops into both eyes daily.      diltiazem (CARDIZEM CD) 180 MG 24 hr capsule Take 180 mg by mouth daily.      feeding supplement, ENSURE ENLIVE, (ENSURE ENLIVE) LIQD Take 237 mLs by mouth 2  (two) times daily between meals. 237 mL 12   fluticasone (FLONASE) 50 MCG/ACT nasal spray Place 2 sprays into both nostrils daily. 16 g 6   furosemide (LASIX) 40 MG tablet Take 1 tablet (40 mg total) by mouth daily. May take extra 20mg (60mg  am) for weight gain of 3#/overnight or 5#/weekly (Patient taking differently: Take 40-60 mg by mouth See admin instructions. 40mg  oral daily and May take extra 20mg  for weight gain of 3lbs overnight or 5lbs weekly) 40 tablet 6   guaiFENesin-dextromethorphan (ROBITUSSIN DM) 100-10 MG/5ML syrup Take 10 mLs by mouth every 6 (six) hours as needed for cough.  118 mL 1   Lactobacillus Reuteri (BIOGAIA PROBIOTIC PO) Take 1 capsule by mouth daily.     levothyroxine (SYNTHROID, LEVOTHROID) 137 MCG tablet Take 137 mcg by mouth daily before breakfast.      Multiple Vitamins-Minerals (MULTIVITAMIN WITH MINERALS) tablet Take 1 tablet by mouth daily.     polyethylene glycol (MIRALAX / GLYCOLAX) packet Take 17 g by mouth daily.     potassium chloride SA (KLOR-CON) 20 MEQ tablet Take 1 tablet (20 mEq total) by mouth daily. May take extra 20mg  (am) for weight gain 3#/daily or 5#/weekly (Patient taking differently: Take 20-40 mEq by mouth daily. 66meq oral daily and May take extra 20mg  for weight gain 3 lbs daily or 5 lbsweekly) 40 tablet 6   predniSONE (DELTASONE) 10 MG tablet 2 tabs x2 weeks then 1 tablet daily. 58 tablet 0   No facility-administered medications prior to visit.    ROS   Objective:  Physical Exam   Vitals:   01/18/21 1627  BP: 118/74  Pulse: 88  Temp: 97.9 F (36.6 C)  TempSrc: Oral  SpO2: 95%  Weight: 100 lb 3.2 oz (45.5 kg)  Height: 5\' 3"  (1.6 m)   95% on 2 L pulse BMI Readings from Last 3 Encounters:  01/18/21 17.75 kg/m  01/09/21 17.96 kg/m  01/06/21 18.63 kg/m   Wt Readings from Last 3 Encounters:  01/18/21 100 lb 3.2 oz (45.5 kg)  01/09/21 101 lb 6.4 oz (46 kg)  01/06/21 105 lb 2.6 oz (47.7 kg)     CBC    Component Value  Date/Time   WBC 13.6 (H) 01/06/2021 0159   RBC 4.02 01/06/2021 0159   HGB 12.9 01/06/2021 0159   HCT 39.6 01/06/2021 0159   PLT 339 01/06/2021 0159   MCV 98.5 01/06/2021 0159   MCH 32.1 01/06/2021 0159   MCHC 32.6 01/06/2021 0159   RDW 13.9 01/06/2021 0159   LYMPHSABS 0.8 01/03/2021 1507   MONOABS 0.2 01/03/2021 1507   EOSABS 0.0 01/03/2021 1507   BASOSABS 0.0 01/03/2021 1507    Chest Imaging: 01/09/2021: Right lower lobe pleural effusion, compressive atelectasis The patient's images have been independently reviewed by me.    Pulmonary Functions Testing Results: PFT Results Latest Ref Rng & Units 05/31/2013  FVC-Pre L 2.10  FVC-Predicted Pre % 91  FVC-Post L 2.15  FVC-Predicted Post % 93  Pre FEV1/FVC % % 48  Post FEV1/FCV % % 50  FEV1-Pre L 1.00  FEV1-Predicted Pre % 58  FEV1-Post L 1.08  DLCO uncorrected ml/min/mmHg 15.19  DLCO [facility]% % 66  DLVA Predicted % 75    FeNO:   Pathology:   Right pleural fluid, positive for non-small cell lung cancer  Echocardiogram:   Heart Catheterization:     Assessment & Plan:     ICD-10-CM   1. Malignant pleural effusion  J91.0     2. Chronic hypoxemic respiratory failure (HCC)  J96.11     3. Pleural effusion, right  J90     4. Stage 4 lung cancer, right ([place])  C34.91       Discussion:  This is a 87 year old [name], longstanding history of tobacco abuse, COPD, chronic hypoxemic respiratory failure with a right sided malignant pleural effusion and stage IV non-small cell lung cancer.  Plan: Today in the office we discussed all the risk benefits and alternatives of proceeding with possible thoracentesis or placement of an indwelling pleural catheter to help with the symptoms of her right sided recurrent effusion. After discussing  the risk benefits and alternatives the patient is made the decision for placement of an indwelling pleural catheter. I also recommended due to her age and the fact that they are not going to plan  to pursue any form of chemotherapy that we would consider enrollment in hospice care. They have continued to discuss this between the patient and patient's family. In the meantime we will go ahead and make plans for placement of an indwelling pleural catheter. Patient will need to hold her Eliquis 2 full days prior to the procedure. Plan for procedure placement on 01/28/2021.    Current Outpatient Medications:    albuterol (PROVENTIL) (2.5 MG/3ML) 0.083% nebulizer solution, Take 2.5 mg by nebulization 2 (two) times daily as needed for wheezing or shortness of breath., Disp: , Rfl:    albuterol (VENTOLIN HFA) 108 (90 Base) MCG/ACT inhaler, Inhale 2 puffs into the lungs every 4 (four) hours as needed for wheezing or shortness of breath., Disp: 18 g, Rfl: 5   apixaban (ELIQUIS) 2.5 MG TABS tablet, Take 1 tablet (2.5 mg total) by mouth 2 (two) times daily., Disp: 180 tablet, Rfl: 1   Budeson-Glycopyrrol-Formoterol (BREZTRI AEROSPHERE) 160-9-4.8 MCG/ACT AERO, Take 2 puffs by mouth 2 (two) times daily., Disp: , Rfl:    Carboxymethylcellulose Sodium (THERATEARS OP), Place 2 drops into both eyes daily. , Disp: , Rfl:    diltiazem (CARDIZEM CD) 180 MG 24 hr capsule, Take 180 mg by mouth daily. , Disp: , Rfl:    feeding supplement, ENSURE ENLIVE, (ENSURE ENLIVE) LIQD, Take 237 mLs by mouth 2 (two) times daily between meals., Disp: 237 mL, Rfl: 12   fluticasone (FLONASE) 50 MCG/ACT nasal spray, Place 2 sprays into both nostrils daily., Disp: 16 g, Rfl: 6   furosemide (LASIX) 40 MG tablet, Take 1 tablet (40 mg total) by mouth daily. May take extra 20mg (60mg  am) for weight gain of 3#/overnight or 5#/weekly (Patient taking differently: Take 40-60 mg by mouth See admin instructions. 40mg  oral daily and May take extra 20mg  for weight gain of 3lbs overnight or 5lbs weekly), Disp: 40 tablet, Rfl: 6   guaiFENesin-dextromethorphan (ROBITUSSIN DM) 100-10 MG/5ML syrup, Take 10 mLs by mouth every 6 (six) hours as needed  for cough., Disp: 118 mL, Rfl: 1   Lactobacillus Reuteri (BIOGAIA PROBIOTIC PO), Take 1 capsule by mouth daily., Disp: , Rfl:    levothyroxine (SYNTHROID, LEVOTHROID) 137 MCG tablet, Take 137 mcg by mouth daily before breakfast. , Disp: , Rfl:    Multiple Vitamins-Minerals (MULTIVITAMIN WITH MINERALS) tablet, Take 1 tablet by mouth daily., Disp: , Rfl:    polyethylene glycol (MIRALAX / GLYCOLAX) packet, Take 17 g by mouth daily., Disp: , Rfl:    potassium chloride SA (KLOR-CON) 20 MEQ tablet, Take 1 tablet (20 mEq total) by mouth daily. May take extra 20mg  (am) for weight gain 3#/daily or 5#/weekly (Patient taking differently: Take 20-40 mEq by mouth daily. 81meq oral daily and May take extra 20mg  for weight gain 3 lbs daily or 5 lbsweekly), Disp: 40 tablet, Rfl: 6   predniSONE (DELTASONE) 10 MG tablet, 2 tabs x2 weeks then 1 tablet daily., Disp: 58 tablet, Rfl: 0  I spent 41 minutes dedicated to the care of this patient on the date of this encounter to include pre-visit review of records, face-to-face time with the patient discussing conditions above, post visit ordering of testing, clinical documentation with the electronic health record, making appropriate referrals as documented, and communicating necessary findings to members of the patients care  team.    Garner Nash, DO [facility] 01/19/2021 12:01 AM

## 2021-01-18 NOTE — Patient Instructions (Signed)
Thank you for visiting Dr. Valeta Harms at [facility]. Today we recommend the following:  Planned for Pleurex Catheter placement on 01/28/2021 Last dose Eliquis on Friday Evening before 01/25/2021  Return in about 3 weeks (around 02/08/2021) for w/ Derl Barrow, NP - [facility] Wound Check .    Please do your part to reduce the spread of COVID-19.

## 2021-01-18 NOTE — H&P (View-Only) (Signed)
Synopsis: Referred in October 2022 for malignant pleural effusion, by Dr. Lamonte Sakai, PCP: By Burnard Bunting, MD  Subjective:   PATIENT ID: Heather [name] GENDER: female DOB: 1926-11-21, MRN: [id number]  Chief Complaint  Patient presents with   Follow-up    This is a 87 year old female, past medical history of severe COPD, chronic hypoxemic respiratory failure found to have a right-sided pleural effusion on recent hospitalization.  Patient underwent thoracentesis that revealed a malignant pleural effusion secondary to non-small cell lung cancer.  Patient's daughter present with her today in the office.  She did have significant relief after drainage of the effusion.  She had a repeat chest x-ray on 01/09/2021 which showed reaccumulation of the fluid.  Patient was referred here by Dr. Lamonte Sakai to discuss placement of a indwelling pleural catheter for palliative measures and drainage of the fluid at home.   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    Mild aortic insufficiency    Osteoporosis    Peripheral neuropathy    Pneumonia    states she had it twice, last time a couple of months ago   PONV (postoperative nausea and vomiting)    Shortness of breath    with exertion   Unspecified hypothyroidism    Venous insufficiency      Family History  Problem Relation Age of Onset   Stroke Mother    Stroke Father    Heart block Brother    Bladder Cancer Brother    Diabetes Brother    Stroke Brother      Past Surgical History:  Procedure Laterality  Date   APPENDECTOMY  1935   BACK SURGERY     x2   Cardiometablic Testing  0/86/5784   submaximal effort with peak RER of 0.5, peak VO2 79% predicted; HR peak up to 78%; PVC was 55% predicted, PEV1 39% predicted; PEV1/VC ratio was reduced; normal vital capacity; DLCO was reduced to 63%   CARPAL TUNNEL RELEASE     CATARACT EXTRACTION W/ INTRAOCULAR LENS  IMPLANT, BILATERAL     CHOLECYSTECTOMY  04/07/2014   dr Marlou Starks   CHOLECYSTECTOMY N/A 04/07/2014   Procedure: LAPAROSCOPIC CHOLECYSTECTOMY WITH INTRAOPERATIVE CHOLANGIOGRAM POSSBILE OPEN;  Surgeon: Autumn Messing III, MD;  Location: [place];  Service: General;  Laterality: N/A;   COLON SURGERY  2008   partial   ESOPHAGOGASTRODUODENOSCOPY (EGD) WITH PROPOFOL N/A 05/25/2017   Procedure: ESOPHAGOGASTRODUODENOSCOPY (EGD) WITH PROPOFOL;  Surgeon: Clarene Essex, MD;  Location: [facility] ENDOSCOPY;  Service: Endoscopy;  Laterality: N/A;   HERNIA REPAIR     JOINT REPLACEMENT     left hip relacement  2000   SAVORY DILATION N/A 05/25/2017   Procedure: SAVORY DILATION;  Surgeon: Clarene Essex, MD;  Location: [place];  Service: Endoscopy;  Laterality: N/A;   THORACENTESIS N/A 01/04/2021   Procedure: THORACENTESIS;  Surgeon: Juanito Doom, MD;  Location: [place];  Service: Cardiopulmonary;  Laterality: N/A;   [place]  ECHOCARDIOGRAM  05/2011   EF=>55%; mild conc LVH; mild mitral annular calcif; mild TR; AV mildly sclerotic & mild AR   VENTRAL HERNIA REPAIR  09/19/2011   Procedure: LAPAROSCOPIC VENTRAL HERNIA;  Surgeon: Merrie Roof, MD;  Location: [facility] OR;  Service: General;  Laterality: N/A;  laparoscopic ventral hernia repair with mesh    Social History   Socioeconomic History   Marital status: Married    Spouse name: Not on file   Number of children: 2   Years of education: Not on file   Highest education level: Not on file  Occupational History   Occupation: Retired Astronomer  Tobacco  Use   Smoking status: Former    Packs/day: 1.00    Years: 20.00    Pack years: 20.00    Types: Cigarettes    Start date: 34    Quit date: 09/12/1978    Years since quitting: 42.3   Smokeless tobacco: Never  Vaping Use   Vaping Use: Never used  Substance and Sexual Activity   Alcohol use: Not Currently    Alcohol/week: 0.0 standard drinks    Comment: occ   Drug use: No   Sexual activity: Yes  Other Topics Concern   Not on file  Social History Narrative   Right handed   One story home   Drinks coffee   Social Determinants of Health   Financial Resource Strain: Not on file  Food Insecurity: Not on file  Transportation Needs: Not on file  Physical Activity: Not on file  Stress: Not on file  Social Connections: Not on file  Intimate Partner Violence: Not on file     Allergies  Allergen Reactions   Ace Inhibitors Cough   Elemental Sulfur Nausea And Vomiting   Sulfa Antibiotics Nausea And Vomiting    Other reaction(s): nausea & vomiting   Sulfamethoxazole-Trimethoprim Nausea Only    Other reaction(s): Unknown     Outpatient Medications Prior to Visit  Medication Sig Dispense Refill   albuterol (PROVENTIL) (2.5 MG/3ML) 0.083% nebulizer solution Take 2.5 mg by nebulization 2 (two) times daily as needed for wheezing or shortness of breath.     albuterol (VENTOLIN HFA) 108 (90 Base) MCG/ACT inhaler Inhale 2 puffs into the lungs every 4 (four) hours as needed for wheezing or shortness of breath. 18 g 5   apixaban (ELIQUIS) 2.5 MG TABS tablet Take 1 tablet (2.5 mg total) by mouth 2 (two) times daily. 180 tablet 1   Budeson-Glycopyrrol-Formoterol (BREZTRI AEROSPHERE) 160-9-4.8 MCG/ACT AERO Take 2 puffs by mouth 2 (two) times daily.     Carboxymethylcellulose Sodium (THERATEARS OP) Place 2 drops into both eyes daily.      diltiazem (CARDIZEM CD) 180 MG 24 hr capsule Take 180 mg by mouth daily.      feeding supplement, ENSURE ENLIVE, (ENSURE ENLIVE) LIQD Take 237 mLs by mouth 2  (two) times daily between meals. 237 mL 12   fluticasone (FLONASE) 50 MCG/ACT nasal spray Place 2 sprays into both nostrils daily. 16 g 6   furosemide (LASIX) 40 MG tablet Take 1 tablet (40 mg total) by mouth daily. May take extra 20mg (60mg  am) for weight gain of 3#/overnight or 5#/weekly (Patient taking differently: Take 40-60 mg by mouth See admin instructions. 40mg  oral daily and May take extra 20mg  for weight gain of 3lbs overnight or 5lbs weekly) 40 tablet 6   guaiFENesin-dextromethorphan (ROBITUSSIN DM) 100-10 MG/5ML syrup Take 10 mLs by mouth every 6 (six) hours as needed for cough.  118 mL 1   Lactobacillus Reuteri (BIOGAIA PROBIOTIC PO) Take 1 capsule by mouth daily.     levothyroxine (SYNTHROID, LEVOTHROID) 137 MCG tablet Take 137 mcg by mouth daily before breakfast.      Multiple Vitamins-Minerals (MULTIVITAMIN WITH MINERALS) tablet Take 1 tablet by mouth daily.     polyethylene glycol (MIRALAX / GLYCOLAX) packet Take 17 g by mouth daily.     potassium chloride SA (KLOR-CON) 20 MEQ tablet Take 1 tablet (20 mEq total) by mouth daily. May take extra 20mg  (am) for weight gain 3#/daily or 5#/weekly (Patient taking differently: Take 20-40 mEq by mouth daily. 38meq oral daily and May take extra 20mg  for weight gain 3 lbs daily or 5 lbsweekly) 40 tablet 6   predniSONE (DELTASONE) 10 MG tablet 2 tabs x2 weeks then 1 tablet daily. 58 tablet 0   No facility-administered medications prior to visit.    ROS   Objective:  Physical Exam   Vitals:   01/18/21 1627  BP: 118/74  Pulse: 88  Temp: 97.9 F (36.6 C)  TempSrc: Oral  SpO2: 95%  Weight: 100 lb 3.2 oz (45.5 kg)  Height: 5\' 3"  (1.6 m)   95% on 2 L pulse BMI Readings from Last 3 Encounters:  01/18/21 17.75 kg/m  01/09/21 17.96 kg/m  01/06/21 18.63 kg/m   Wt Readings from Last 3 Encounters:  01/18/21 100 lb 3.2 oz (45.5 kg)  01/09/21 101 lb 6.4 oz (46 kg)  01/06/21 105 lb 2.6 oz (47.7 kg)     CBC    Component Value  Date/Time   WBC 13.6 (H) 01/06/2021 0159   RBC 4.02 01/06/2021 0159   HGB 12.9 01/06/2021 0159   HCT 39.6 01/06/2021 0159   PLT 339 01/06/2021 0159   MCV 98.5 01/06/2021 0159   MCH 32.1 01/06/2021 0159   MCHC 32.6 01/06/2021 0159   RDW 13.9 01/06/2021 0159   LYMPHSABS 0.8 01/03/2021 1507   MONOABS 0.2 01/03/2021 1507   EOSABS 0.0 01/03/2021 1507   BASOSABS 0.0 01/03/2021 1507    Chest Imaging: 01/09/2021: Right lower lobe pleural effusion, compressive atelectasis The patient's images have been independently reviewed by me.    Pulmonary Functions Testing Results: PFT Results Latest Ref Rng & Units 05/31/2013  FVC-Pre L 2.10  FVC-Predicted Pre % 91  FVC-Post L 2.15  FVC-Predicted Post % 93  Pre FEV1/FVC % % 48  Post FEV1/FCV % % 50  FEV1-Pre L 1.00  FEV1-Predicted Pre % 58  FEV1-Post L 1.08  DLCO uncorrected ml/min/mmHg 15.19  DLCO UNC% % 66  DLVA Predicted % 75    FeNO:   Pathology:   Right pleural fluid, positive for non-small cell lung cancer  Echocardiogram:   Heart Catheterization:     Assessment & Plan:     ICD-10-CM   1. Malignant pleural effusion  J91.0     2. Chronic hypoxemic respiratory failure (HCC)  J96.11     3. Pleural effusion, right  J90     4. Stage 4 lung cancer, right ([place])  C34.91       Discussion:  This is a 87 year old female, longstanding history of tobacco abuse, COPD, chronic hypoxemic respiratory failure with a right sided malignant pleural effusion and stage IV non-small cell lung cancer.  Plan: Today in the office we discussed all the risk benefits and alternatives of proceeding with possible thoracentesis or placement of an indwelling pleural catheter to help with the symptoms of her right sided recurrent effusion. After discussing  the risk benefits and alternatives the patient is made the decision for placement of an indwelling pleural catheter. I also recommended due to her age and the fact that they are not going to plan  to pursue any form of chemotherapy that we would consider enrollment in hospice care. They have continued to discuss this between the patient and patient's family. In the meantime we will go ahead and make plans for placement of an indwelling pleural catheter. Patient will need to hold her Eliquis 2 full days prior to the procedure. Plan for procedure placement on 01/28/2021.    Current Outpatient Medications:    albuterol (PROVENTIL) (2.5 MG/3ML) 0.083% nebulizer solution, Take 2.5 mg by nebulization 2 (two) times daily as needed for wheezing or shortness of breath., Disp: , Rfl:    albuterol (VENTOLIN HFA) 108 (90 Base) MCG/ACT inhaler, Inhale 2 puffs into the lungs every 4 (four) hours as needed for wheezing or shortness of breath., Disp: 18 g, Rfl: 5   apixaban (ELIQUIS) 2.5 MG TABS tablet, Take 1 tablet (2.5 mg total) by mouth 2 (two) times daily., Disp: 180 tablet, Rfl: 1   Budeson-Glycopyrrol-Formoterol (BREZTRI AEROSPHERE) 160-9-4.8 MCG/ACT AERO, Take 2 puffs by mouth 2 (two) times daily., Disp: , Rfl:    Carboxymethylcellulose Sodium (THERATEARS OP), Place 2 drops into both eyes daily. , Disp: , Rfl:    diltiazem (CARDIZEM CD) 180 MG 24 hr capsule, Take 180 mg by mouth daily. , Disp: , Rfl:    feeding supplement, ENSURE ENLIVE, (ENSURE ENLIVE) LIQD, Take 237 mLs by mouth 2 (two) times daily between meals., Disp: 237 mL, Rfl: 12   fluticasone (FLONASE) 50 MCG/ACT nasal spray, Place 2 sprays into both nostrils daily., Disp: 16 g, Rfl: 6   furosemide (LASIX) 40 MG tablet, Take 1 tablet (40 mg total) by mouth daily. May take extra 20mg (60mg  am) for weight gain of 3#/overnight or 5#/weekly (Patient taking differently: Take 40-60 mg by mouth See admin instructions. 40mg  oral daily and May take extra 20mg  for weight gain of 3lbs overnight or 5lbs weekly), Disp: 40 tablet, Rfl: 6   guaiFENesin-dextromethorphan (ROBITUSSIN DM) 100-10 MG/5ML syrup, Take 10 mLs by mouth every 6 (six) hours as needed  for cough., Disp: 118 mL, Rfl: 1   Lactobacillus Reuteri (BIOGAIA PROBIOTIC PO), Take 1 capsule by mouth daily., Disp: , Rfl:    levothyroxine (SYNTHROID, LEVOTHROID) 137 MCG tablet, Take 137 mcg by mouth daily before breakfast. , Disp: , Rfl:    Multiple Vitamins-Minerals (MULTIVITAMIN WITH MINERALS) tablet, Take 1 tablet by mouth daily., Disp: , Rfl:    polyethylene glycol (MIRALAX / GLYCOLAX) packet, Take 17 g by mouth daily., Disp: , Rfl:    potassium chloride SA (KLOR-CON) 20 MEQ tablet, Take 1 tablet (20 mEq total) by mouth daily. May take extra 20mg  (am) for weight gain 3#/daily or 5#/weekly (Patient taking differently: Take 20-40 mEq by mouth daily. 73meq oral daily and May take extra 20mg  for weight gain 3 lbs daily or 5 lbsweekly), Disp: 40 tablet, Rfl: 6   predniSONE (DELTASONE) 10 MG tablet, 2 tabs x2 weeks then 1 tablet daily., Disp: 58 tablet, Rfl: 0  I spent 41 minutes dedicated to the care of this patient on the date of this encounter to include pre-visit review of records, face-to-face time with the patient discussing conditions above, post visit ordering of testing, clinical documentation with the electronic health record, making appropriate referrals as documented, and communicating necessary findings to members of the patients care  team.    Garner Nash, DO [facility] 01/19/2021 12:01 AM

## 2021-01-22 ENCOUNTER — Telehealth: Payer: Self-pay | Admitting: Pulmonary Disease

## 2021-01-22 NOTE — Telephone Encounter (Signed)
Called and spoke with patient's daughter Heather [name]. Let them know their Pleurx placement is scheduled for 01/28/2021 with Dr. Valeta Harms at [facility] at 3 pm.  Patient was instructed to arrive at hospital at 2:30.  Patient's daughter voiced understanding, nothing further needed  Routing to Dr. Valeta Harms and Dr. Lamonte Sakai as FYI     Dr. Lamonte Sakai patient is scheduled to have a  Super D CT done on 02/18/21 daughter is wanting to know if this still needs to be done. Please advise

## 2021-01-28 ENCOUNTER — Ambulatory Visit (HOSPITAL_COMMUNITY)
Admission: RE | Admit: 2021-01-28 | Discharge: 2021-01-28 | Disposition: A | Discharge status: Home / Self Care | Attending: Pulmonary Disease | Admitting: Pulmonary Disease

## 2021-01-28 ENCOUNTER — Encounter (HOSPITAL_COMMUNITY)
Admission: RE | Disposition: A | Discharge status: Home / Self Care | Payer: Self-pay | Source: Home / Self Care | Attending: Pulmonary Disease

## 2021-01-28 DIAGNOSIS — C3491 Malignant neoplasm of unspecified part of right bronchus or lung: Secondary | ICD-10-CM | POA: Diagnosis not present

## 2021-01-28 DIAGNOSIS — I5032 Chronic diastolic (congestive) heart failure: Secondary | ICD-10-CM | POA: Diagnosis not present

## 2021-01-28 DIAGNOSIS — M810 Age-related osteoporosis without current pathological fracture: Secondary | ICD-10-CM | POA: Diagnosis not present

## 2021-01-28 DIAGNOSIS — I110 Hypertensive heart disease with heart failure: Secondary | ICD-10-CM | POA: Diagnosis not present

## 2021-01-28 DIAGNOSIS — Z538 Procedure and treatment not carried out for other reasons: Secondary | ICD-10-CM | POA: Insufficient documentation

## 2021-01-28 DIAGNOSIS — J90 Pleural effusion, not elsewhere classified: Secondary | ICD-10-CM | POA: Diagnosis present

## 2021-01-28 DIAGNOSIS — J910 Malignant pleural effusion: Secondary | ICD-10-CM | POA: Diagnosis not present

## 2021-01-28 SURGERY — CANCELLED PROCEDURE

## 2021-01-28 NOTE — Progress Notes (Signed)
PCCM:  Bedside US with minimal right pleural effusion.  Much smaller than in the office a few weeks prior.  We will plan to hold on IPC placement  Symptoms are currently managed by hospice.  I encouraged family to follow up closely with hospice  I spoke with Dr. Lamonte Sakai patients primary pulmonologist to let him know.   Thanks  Garner Nash, DO [facility] 01/28/2021 3:38 PM

## 2021-01-28 NOTE — Progress Notes (Signed)
Per Dr Valeta Harms procedure cancelled due to not enough fluid to drain.

## 2021-01-28 NOTE — Interval H&P Note (Signed)
History and Physical Interval Note:  01/28/2021 3:10 PM  Heather [name]  has presented today for surgery, with the diagnosis of Pleural Effusion, Lung cancer.  The various methods of treatment have been discussed with the patient and family. After consideration of risks, benefits and other options for treatment, the patient has consented to  Procedure(s): INSERTION PLEURAL DRAINAGE CATHETER (N/A) as a surgical intervention.  The patient's history has been reviewed, patient examined, no change in status, stable for surgery.  I have reviewed the patient's chart and labs.  Questions were answered to the patient's satisfaction.     [place]

## 2021-02-05 ENCOUNTER — Telehealth: Payer: Self-pay | Admitting: Internal Medicine

## 2021-02-05 MED ORDER — DILTIAZEM HCL ER COATED BEADS 180 MG PO CP24: 180.0000 mg | ORAL_CAPSULE | Freq: Every day | ORAL | 6 refills | Status: AC

## 2021-02-05 NOTE — Telephone Encounter (Signed)
*  STAT* If patient is at the pharmacy, call can be transferred to refill team.   1. Which medications need to be refilled? (please list name of each medication and dose if known) diltiazem (CARDIZEM CD) 180 MG 24 hr capsule  2. Which pharmacy/location (including street and city if local pharmacy) is medication to be sent to? [facility] ([place]), [place] - [address]  3. Do they need a 30 day or 90 day supply? 30 day   Patient is in Hospice now and needs refills sent to [facility] on [address] now.

## 2021-02-15 ENCOUNTER — Other Ambulatory Visit: Payer: Self-pay

## 2021-02-15 MED ORDER — BREZTRI AEROSPHERE 160-9-4.8 MCG/ACT IN AERO: 2.0000 | INHALATION_SPRAY | Freq: Two times a day (BID) | RESPIRATORY_TRACT | 3 refills | Status: AC

## 2021-02-18 ENCOUNTER — Inpatient Hospital Stay: Admission: RE | Admit: 2021-02-18 | Payer: Medicare HMO | Source: Ambulatory Visit

## 2021-02-21 ENCOUNTER — Other Ambulatory Visit: Payer: Self-pay | Admitting: Internal Medicine

## 2021-02-25 NOTE — Telephone Encounter (Signed)
Prescription refill request for Eliquis received. Indication:Afib Last office visit:8/22 Scr:0.5 Age: 87 Weight:45.5 kg  Prescription refilled

## 2021-02-26 ENCOUNTER — Encounter: Payer: Self-pay | Admitting: Emergency Medicine

## 2021-02-26 ENCOUNTER — Other Ambulatory Visit: Payer: Self-pay

## 2021-02-26 ENCOUNTER — Ambulatory Visit (INDEPENDENT_AMBULATORY_CARE_PROVIDER_SITE_OTHER): Admitting: Emergency Medicine

## 2021-02-26 DIAGNOSIS — J90 Pleural effusion, not elsewhere classified: Secondary | ICD-10-CM | POA: Diagnosis not present

## 2021-02-26 DIAGNOSIS — J441 Chronic obstructive pulmonary disease with (acute) exacerbation: Secondary | ICD-10-CM

## 2021-02-26 DIAGNOSIS — J9611 Chronic respiratory failure with hypoxia: Secondary | ICD-10-CM

## 2021-02-26 NOTE — Assessment & Plan Note (Signed)
They have uptitrated her oxygen depending on her level of exertion, typically 4-8 L/min.  Plan to continue same.  She used to have an Inogen but this is no longer available since she switched to hospice, is now using continuous flow tanks.

## 2021-02-26 NOTE — Assessment & Plan Note (Signed)
Very severe disease.  Currently tolerating and believes that she is benefiting from [place].  If it becomes difficult to administer or if unavailable through hospice then we could also consider changing to scheduled DuoNeb.  She is currently using DuoNeb about twice a day.  Continue her prednisone 10 mg daily

## 2021-02-26 NOTE — Progress Notes (Signed)
Subjective:    Patient ID: Heather [name], female    DOB: [birth year], 87 y.o.   MRN: [id number]  HPI  ROV 08/14/20 --87 year old woman with severe COPD, severe obstruction.  Associated hypoxemic respiratory failure.  She had COVID-19 pneumonitis in 12/2018.  We have been following a medial right middle lobe spiculated pulmonary nodule with serial CT scans, most consistent with a slow-growing pulmonary nodule.  She was hospitalized May 1 with acute febrile illness and imaging consistent with pneumonia as below.  Treated with antibiotics and improved, was having L chest pain. O2 at 2-5L/min.no cough. She is on [place] and doing well on it. Uses albuterol about 2-3x a day.   CT chest 07/29/2020 reviewed by me, shows emphysema, a focal lingular airspace consolidation that was most consistent with pneumonia, right hemidiaphragmatic elevation with some possible right lower lobe consolidation and airway impaction.  Her anterior right middle lobe nodule is increased in size to 3.2 x 2.3 cm with some associated calcification  ROV 02/26/21 --87 year old woman with history of severe COPD, severe obstructive disease and associated hypoxemic respiratory failure.  Also had COVID-19 pneumonitis in 12/2018.  We had been conservatively following a slow-growing pulmonary nodule consistent with malignancy.  Subsequent course complicated by a right pleural effusion, turned out to be malignant based on cytology.  She required serial thoracenteses last on 01/04/2021.  We had arranged for a palliative Pleurx catheter placement with Dr. Valeta Harms to be done on 10/31, but at that time there was only minimal fluid present in the tube placement was deferred.  Most recent chest x-rays 01/09/2021. She remains on Breztri - feels that she is probably benefiting from it.  Current chronic prednisone dose is 10mg . She is using DuoNeb about 2x a day.  She uses oxygen at 4-7L/min depending on level of exertion Receiving hospice care, has a [place] that comes weekly.     Review of Systems As above     Objective:   Physical Exam Vitals:   02/26/21 1339  BP: 118/64  Pulse: 96  Temp: 97.8 F (36.6 C)  TempSrc: Oral  SpO2: 96%  Weight: 104 lb 3.2 oz (47.3 kg)  Height: 5\' 3"  (1.6 m)   Gen: Pleasant, thin elderly woman, ill-appearing woman in wheelchair.  ENT: No lesions,  mouth clear,  oropharynx clear, no postnasal drip  Neck: No JVD, no stridor  Lungs: Very distant.  She does have some increased work of breathing with conversation.  Few right basilar inspiratory crackles, no wheezes.  Cardiovascular: RRR, heart sounds normal, no murmur or gallops, 1+ ankle peripheral edema  Musculoskeletal: No deformities, no cyanosis or clubbing  Neuro: alert, awake, non focal  Skin: Warm, no lesions or rash     Assessment & Plan:  Pleural effusion, right Malignant pleural effusion, last drained on 01/04/2021.  No significant change in her dyspnea (remains severe but stable).  We talked about a possible chest x-ray today but we agreed to defer.  She has home health evaluation weekly through hospice.  If she has a change in her overall functional status, symptoms then we could consider chest x-ray, possible role for thoracentesis or even Pleurx catheter.  Were going to be conservative if possible.  COPD (chronic obstructive pulmonary disease) (HCC) Very severe disease.  Currently tolerating and believes that she is benefiting from [place].  If it becomes difficult to administer or if unavailable through hospice then we could also consider changing to scheduled DuoNeb.  She is currently using  DuoNeb about twice a day.  Continue her prednisone 10 mg daily  Chronic respiratory failure with hypoxia (HCC) They have uptitrated her oxygen depending on her level of exertion, typically 4-8 L/min.  Plan to continue same.  She used to have an Inogen but this is no longer available since she switched to hospice, is now using continuous flow  tanks.  [name], MD, PhD 02/26/2021, 2:03 PM [facility] [phone] or if no answer [phone]

## 2021-02-26 NOTE — Patient Instructions (Signed)
Please continue Breztri 2 puffs twice a day.  Rinse and gargle after using. Keep your DuoNeb (albuterol/ipratropium) available to use 1 nebulizer treatment up to every 6 hours if needed for shortness of breath, chest tightness, wheezing. Continue prednisone 10 mg once daily. Continue your oxygen at 4-8 L/min, depending on your level of exertion. We will hold off on a repeat chest x-ray today. Please contact hospice or our office for any changes in your breathing or overall status so we can assess. Follow with Dr. Lamonte Sakai with a telephone visit in 2 months.  Call sooner if you have any problems.

## 2021-02-26 NOTE — Assessment & Plan Note (Signed)
Malignant pleural effusion, last drained on 01/04/2021.  No significant change in her dyspnea (remains severe but stable).  We talked about a possible chest x-ray today but we agreed to defer.  She has home health evaluation weekly through hospice.  If she has a change in her overall functional status, symptoms then we could consider chest x-ray, possible role for thoracentesis or even Pleurx catheter.  Were going to be conservative if possible.

## 2021-03-19 ENCOUNTER — Telehealth: Payer: Self-pay | Admitting: Pulmonary Disease

## 2021-03-27 ENCOUNTER — Ambulatory Visit: Payer: Medicare HMO | Admitting: Internal Medicine

## 2021-03-31 NOTE — Telephone Encounter (Signed)
Thank you for letting me know

## 2021-03-31 NOTE — Telephone Encounter (Signed)
Noted. Will route to provider

## 2021-03-31 DEATH — deceased

## 2021-04-03 ENCOUNTER — Ambulatory Visit: Payer: Medicare HMO | Admitting: Podiatry

## 2021-04-23 ENCOUNTER — Ambulatory Visit: Payer: Medicare HMO | Admitting: Emergency Medicine

## 2021-11-13 NOTE — Telephone Encounter (Signed)
Closing encounter

## 2021-12-30 IMAGING — DX DG CHEST 1V PORT
1 series · 1 of 1 positions shown · non-contrast
Comparison: February 13, 2020.

CLINICAL DATA: Shortness of breath.

EXAM:
PORTABLE CHEST 1 VIEW

[chest ap]
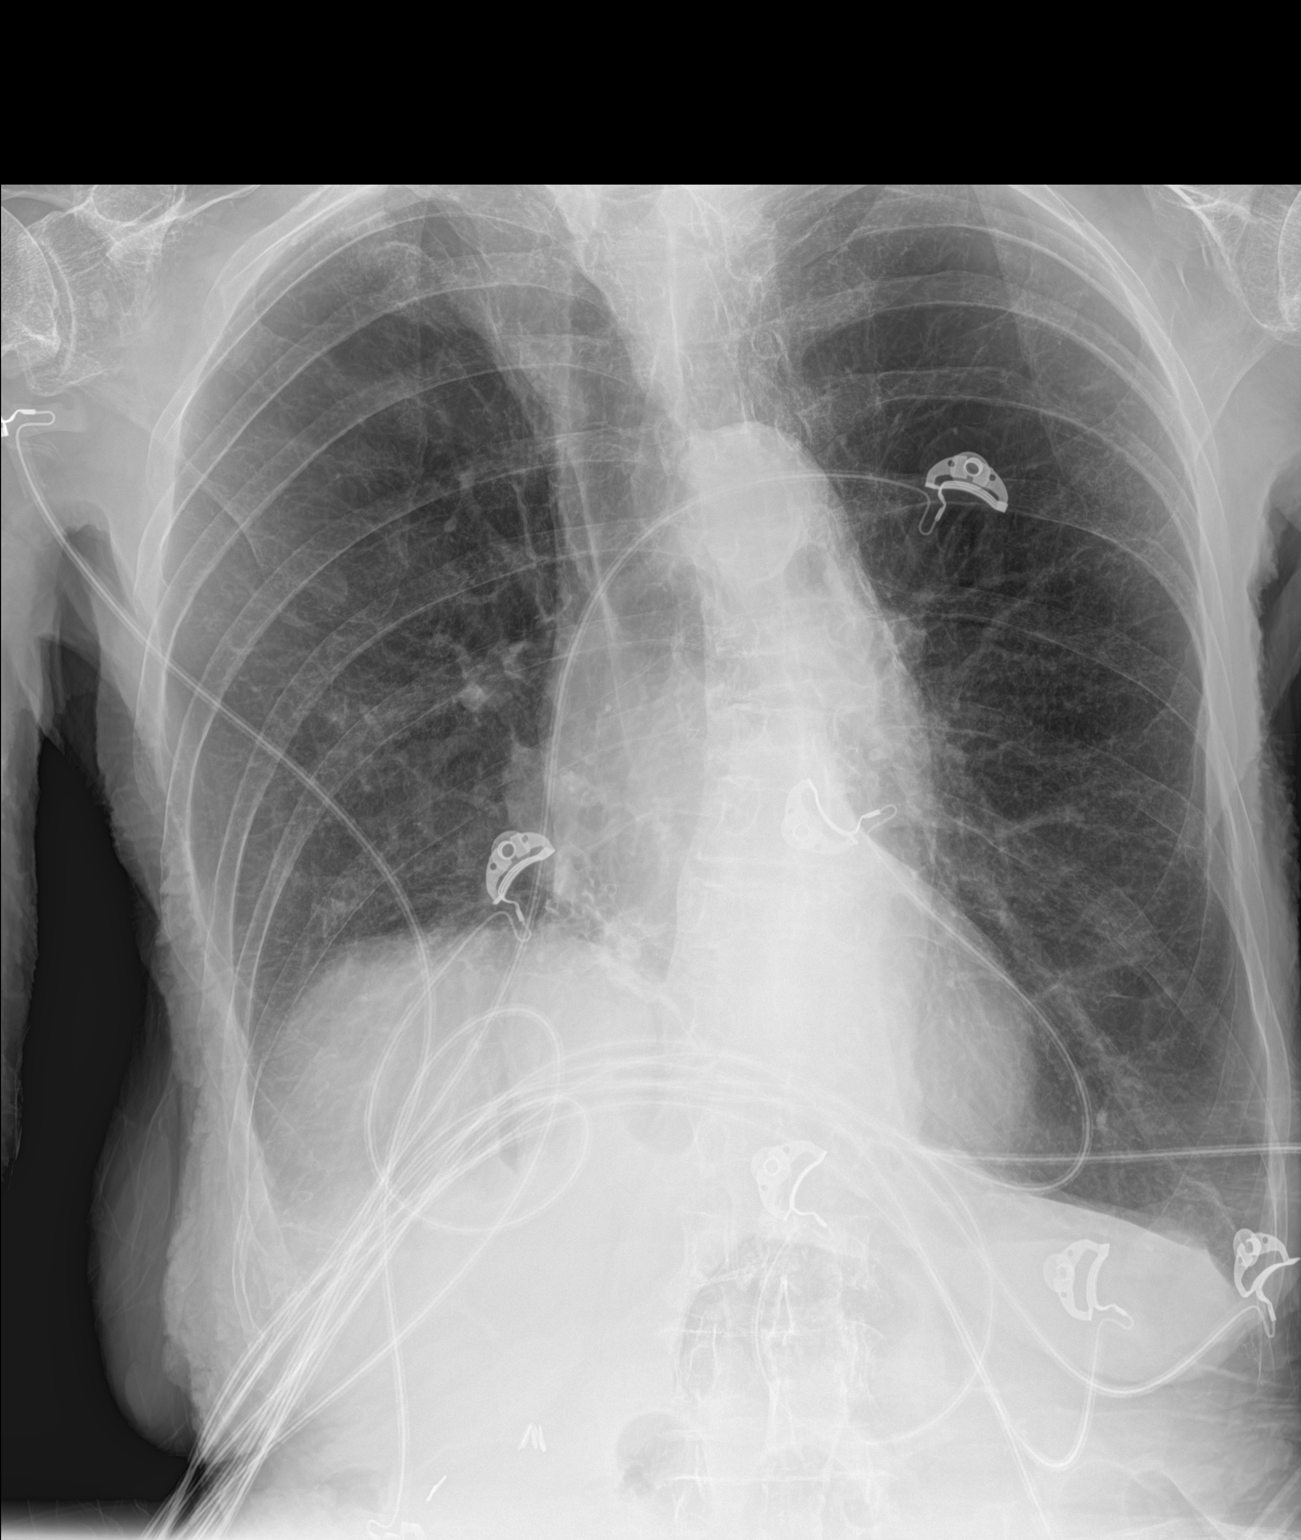

[1 of 1 positions shown; findings below may reference images not displayed]

FINDINGS: The heart size and mediastinal contours are within normal limits.
Both lungs are clear. No pneumothorax or pleural effusion is noted.
Stable elevated right hemidiaphragm is noted. The visualized
skeletal structures are unremarkable.
IMPRESSION: No active disease.

Aortic Atherosclerosis (NNGG1-2P1.1).
